# Patient Record
Sex: Male | Born: 1945 | Race: White | Hispanic: No | Marital: Married | Smoking: Former smoker
Health system: Southern US, Community
[De-identification: ages and names within clinical notes are randomized; demographics above are authoritative.]

## PROBLEM LIST (undated history)

## (undated) DIAGNOSIS — I2729 Other secondary pulmonary hypertension: Secondary | ICD-10-CM

## (undated) DIAGNOSIS — Z9289 Personal history of other medical treatment: Secondary | ICD-10-CM

## (undated) DIAGNOSIS — N183 Chronic kidney disease, stage 3 unspecified: Secondary | ICD-10-CM

## (undated) DIAGNOSIS — I714 Abdominal aortic aneurysm, without rupture, unspecified: Secondary | ICD-10-CM

## (undated) DIAGNOSIS — Z9981 Dependence on supplemental oxygen: Secondary | ICD-10-CM

## (undated) DIAGNOSIS — E785 Hyperlipidemia, unspecified: Secondary | ICD-10-CM

## (undated) DIAGNOSIS — E119 Type 2 diabetes mellitus without complications: Secondary | ICD-10-CM

## (undated) DIAGNOSIS — I10 Essential (primary) hypertension: Secondary | ICD-10-CM

## (undated) DIAGNOSIS — K219 Gastro-esophageal reflux disease without esophagitis: Secondary | ICD-10-CM

## (undated) DIAGNOSIS — J449 Chronic obstructive pulmonary disease, unspecified: Secondary | ICD-10-CM

## (undated) DIAGNOSIS — M1990 Unspecified osteoarthritis, unspecified site: Secondary | ICD-10-CM

## (undated) DIAGNOSIS — I480 Paroxysmal atrial fibrillation: Secondary | ICD-10-CM

## (undated) DIAGNOSIS — I5032 Chronic diastolic (congestive) heart failure: Secondary | ICD-10-CM

## (undated) DIAGNOSIS — Z8719 Personal history of other diseases of the digestive system: Secondary | ICD-10-CM

## (undated) HISTORY — DX: Essential (primary) hypertension: I10

## (undated) HISTORY — PX: KNEE ARTHROSCOPY: SHX127

## (undated) HISTORY — DX: Chronic diastolic (congestive) heart failure: I50.32

## (undated) HISTORY — DX: Other secondary pulmonary hypertension: I27.29

## (undated) HISTORY — DX: Personal history of other medical treatment: Z92.89

## (undated) HISTORY — DX: Hyperlipidemia, unspecified: E78.5

## (undated) HISTORY — DX: Gastro-esophageal reflux disease without esophagitis: K21.9

## (undated) HISTORY — DX: Paroxysmal atrial fibrillation: I48.0

---

## 2001-10-20 ENCOUNTER — Encounter: Admission: RE | Admit: 2001-10-20 | Discharge: 2001-10-20 | Payer: Self-pay | Admitting: Family Medicine

## 2001-10-20 ENCOUNTER — Encounter: Payer: Self-pay | Admitting: Family Medicine

## 2004-12-18 ENCOUNTER — Emergency Department (HOSPITAL_COMMUNITY): Admission: EM | Admit: 2004-12-18 | Discharge: 2004-12-18 | Payer: Self-pay | Admitting: Family Medicine

## 2009-03-21 ENCOUNTER — Emergency Department (HOSPITAL_COMMUNITY): Admission: EM | Admit: 2009-03-21 | Discharge: 2009-03-21 | Payer: Self-pay | Admitting: Emergency Medicine

## 2012-10-03 ENCOUNTER — Other Ambulatory Visit: Payer: Self-pay | Admitting: Family Medicine

## 2012-10-03 DIAGNOSIS — N189 Chronic kidney disease, unspecified: Secondary | ICD-10-CM

## 2012-10-06 ENCOUNTER — Ambulatory Visit
Admission: RE | Admit: 2012-10-06 | Discharge: 2012-10-06 | Disposition: A | Discharge status: Home / Self Care | Payer: Medicare Other | Source: Ambulatory Visit | Attending: Family Medicine | Admitting: Family Medicine

## 2012-10-06 ENCOUNTER — Encounter: Payer: Self-pay | Admitting: Vascular Surgery

## 2012-10-06 ENCOUNTER — Other Ambulatory Visit: Payer: Self-pay

## 2012-10-06 DIAGNOSIS — N189 Chronic kidney disease, unspecified: Secondary | ICD-10-CM

## 2012-10-07 ENCOUNTER — Encounter: Payer: Self-pay | Admitting: Vascular Surgery

## 2012-10-07 ENCOUNTER — Ambulatory Visit (INDEPENDENT_AMBULATORY_CARE_PROVIDER_SITE_OTHER): Payer: Medicare Other | Admitting: Vascular Surgery

## 2012-10-07 VITALS — BP 140/94 | HR 81 | Ht 66.5 in | Wt 161.0 lb

## 2012-10-07 DIAGNOSIS — Z01810 Encounter for preprocedural cardiovascular examination: Secondary | ICD-10-CM

## 2012-10-07 DIAGNOSIS — I714 Abdominal aortic aneurysm, without rupture: Secondary | ICD-10-CM

## 2012-10-07 DIAGNOSIS — I70219 Atherosclerosis of native arteries of extremities with intermittent claudication, unspecified extremity: Secondary | ICD-10-CM

## 2012-10-07 NOTE — Progress Notes (Addendum)
Subjective:     Patient ID: Mark [name], male   DOB: 09/13/1946, 67 y.o.   MRN: [id number]  HPI this 67 year old male was referred by Dr. Tanya Nones for evaluation of abdominal aortic aneurysm. Patient was being evaluated for renal insufficiency and ultrasound revealed a 7.1 x 6.8 cm AAA. Patient had no previous knowledge of the aneurysm. He has a history of creatinine of 2.16 07/31/2012 with a BUN of 30. He has no history of diabetes. He does have a history of hypertension. He denies any abdominal or back symptoms. He also relates left buttock discomfort after walking 100 feet which is relieved by rest. He has no symptoms in the right leg. Has a long history of tobacco abuse.  Past Medical History  Diagnosis Date  . Hypertension   . GERD (gastroesophageal reflux disease)   . Hyperlipidemia   . Chronic kidney disease   . Atrial fibrillation     History  Substance Use Topics  . Smoking status: Current Every Day Smoker -- 1.0 packs/day for 50 years    Types: Cigarettes  . Smokeless tobacco: Never Used  . Alcohol Use: No    Family History  Problem Relation Age of Onset  . Diabetes Mother   . Heart disease Father   . Heart attack Father     Allergies  Allergen Reactions  . Niaspan (Niacin Er) Anaphylaxis    Current outpatient prescriptions:amLODipine (NORVASC) 10 MG tablet, Take 10 mg by mouth daily., Disp: , Rfl: ;  aspirin 81 MG tablet, Take 81 mg by mouth daily., Disp: , Rfl: ;  atorvastatin (LIPITOR) 40 MG tablet, Take 40 mg by mouth daily., Disp: , Rfl: ;  losartan (COZAAR) 100 MG tablet, Take 100 mg by mouth daily., Disp: , Rfl: ;  omeprazole (PRILOSEC) 40 MG capsule, Take 40 mg by mouth daily., Disp: , Rfl:   BP 140/94  Pulse 81  Ht 5' 6.5" (1.689 m)  Wt 161 lb (73.029 kg)  BMI 25.60 kg/m2  SpO2 100%  Body mass index is 25.60 kg/(m^2).        Review of Systems denies chest pain but does have mild dyspnea on exertion. No PND or orthopnea or hemoptysis. Does  have a history of varicose veins and weakness in the arms and legs. Denies any CVA or lateralizing weakness amaurosis fugax diplopia blurred vision or syncope. Has been treated for reflux esophagitis     Objective:   Physical Exam blood pressure 140/94 heart rate 81 respirations 16 Gen.-alert and oriented x3 in no apparent distress HEENT normal for age Lungs no rhonchi or wheezing Cardiovascular regular rhythm no murmurs carotid pulses 3+ palpable no bruits audible Abdomen soft nontender-large pulsatile mass in periumbilical region-nontender-approximately 7 cm Musculoskeletal free of  major deformities Skin clear -no rashes Neurologic normal Lower extremities 3+ femoral , popliteal, and dorsalis pedis pulse right leg 2+ femoral-popliteal, and posterior tibial pulse palpable left leg  Today I reviewed the clinical records supplied by Dr. Tanya Nones including the blood work and the ultrasound report which revealed a 7.1 x 6.8 cm infrarenal abdominal aortic aneurysm. 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

## 2012-10-07 NOTE — Addendum Note (Signed)
Addended by: Sharee Pimple on: 10/07/2012 09:42 AM   Modules accepted: Orders

## 2012-10-09 ENCOUNTER — Ambulatory Visit (HOSPITAL_COMMUNITY): Payer: Medicare Other | Attending: Vascular Surgery | Admitting: Radiology

## 2012-10-09 VITALS — BP 138/85 | Ht 66.5 in | Wt 158.0 lb

## 2012-10-09 DIAGNOSIS — I70219 Atherosclerosis of native arteries of extremities with intermittent claudication, unspecified extremity: Secondary | ICD-10-CM

## 2012-10-09 DIAGNOSIS — F17200 Nicotine dependence, unspecified, uncomplicated: Secondary | ICD-10-CM | POA: Insufficient documentation

## 2012-10-09 DIAGNOSIS — I719 Aortic aneurysm of unspecified site, without rupture: Secondary | ICD-10-CM

## 2012-10-09 DIAGNOSIS — I739 Peripheral vascular disease, unspecified: Secondary | ICD-10-CM | POA: Insufficient documentation

## 2012-10-09 DIAGNOSIS — I714 Abdominal aortic aneurysm, without rupture: Secondary | ICD-10-CM

## 2012-10-09 DIAGNOSIS — I10 Essential (primary) hypertension: Secondary | ICD-10-CM | POA: Insufficient documentation

## 2012-10-09 DIAGNOSIS — R0989 Other specified symptoms and signs involving the circulatory and respiratory systems: Secondary | ICD-10-CM | POA: Insufficient documentation

## 2012-10-09 DIAGNOSIS — Z01810 Encounter for preprocedural cardiovascular examination: Secondary | ICD-10-CM

## 2012-10-09 DIAGNOSIS — R0602 Shortness of breath: Secondary | ICD-10-CM

## 2012-10-09 DIAGNOSIS — R000 Tachycardia, unspecified: Secondary | ICD-10-CM | POA: Insufficient documentation

## 2012-10-09 DIAGNOSIS — R0609 Other forms of dyspnea: Secondary | ICD-10-CM | POA: Insufficient documentation

## 2012-10-09 MED ORDER — TECHNETIUM TC 99M SESTAMIBI GENERIC - CARDIOLITE
9.1000 | Freq: Once | INTRAVENOUS | Status: AC | PRN
  Administered 2012-10-09: 9 via INTRAVENOUS

## 2012-10-09 MED ORDER — REGADENOSON 0.4 MG/5ML IV SOLN
0.4000 mg | Freq: Once | INTRAVENOUS | Status: AC
  Administered 2012-10-09: 0.4 mg via INTRAVENOUS

## 2012-10-09 MED ORDER — TECHNETIUM TC 99M SESTAMIBI GENERIC - CARDIOLITE
33.0000 | Freq: Once | INTRAVENOUS | Status: AC | PRN
  Administered 2012-10-09: 33 via INTRAVENOUS

## 2012-10-09 NOTE — Progress Notes (Signed)
[facility] SITE 3 NUCLEAR MED [address] [place], [place] [address] [phone]    Cardiology Nuclear Med Study  Mark [name] is a 67 y.o. male     MRN : [id number]     DOB: 1945-11-04  Procedure Date: 10/09/2012  Nuclear Med Background Indication for Stress Test:  Evaluation for Ischemia and Pending Surgical Clearance: 7.1cm AAA- Dr. Hart Rochester History:  No prior history of CAD Cardiac Risk Factors: Claudication, Family History - CAD, Hypertension, Lipids, PVD and Smoker  Symptoms:  DOE and Rapid HR   Nuclear Pre-Procedure Caffeine/Decaff Intake:  None NPO After: 8:30am   Lungs:  clear O2 Sat: 96% on room air. IV 0.9% NS with Angio Cath:  20g  IV Site: R Antecubital  IV Started by:  Stanton Kidney, EMT-P  Chest Size (in):  42 Cup Size: n/a  Height: 5' 6.5" (1.689 m)  Weight:  158 lb (71.668 kg)  BMI:  Body mass index is 25.12 kg/(m^2). Tech Comments:  NA    Nuclear Med Study 1 or 2 day study: 1 day  Stress Test Type:  Lexiscan  Reading MD: [name], MD  Order Authorizing Provider:  [name], MD  Resting Radionuclide: Technetium 53m Sestamibi  Resting Radionuclide Dose: 9.1 mCi   Stress Radionuclide:  Technetium 83m Sestamibi  Stress Radionuclide Dose: 30.2 mCi           Stress Protocol Rest HR: 61 Stress HR: 104  Rest BP: 138/85 Stress BP: 164/90  Exercise Time (min): n/a METS: n/a   Predicted Max HR: 152 bpm % Max HR: 68.42 bpm Rate Pressure Product: 21308    Dose of Adenosine (mg):  n/a Dose of Lexiscan: 0.4 mg  Dose of Atropine (mg): n/a Dose of Dobutamine: n/a mcg/kg/min (at max HR)  Stress Test Technologist: Bonnita Levan, RN  Nuclear Technologist:  Domenic Polite, CNMT     Rest Procedure:  Myocardial perfusion imaging was performed at rest 45 minutes following the intravenous administration of Technetium 67m Sestamibi. Rest ECG: NSR - Normal EKG  Stress Procedure:  The patient received IV Lexiscan 0.4 mg over 15-seconds.  Technetium  44m Sestamibi injected at 30-seconds.  Quantitative spect images were obtained after a 45 minute delay. Stress ECG: No significant ST segment change suggestive of ischemia.  QPS Raw Data Images:  There is interference from nuclear activity from structures below the diaphragm. This does not affect the ability to read the study. Stress Images:  Normal homogeneous uptake in all areas of the myocardium. Rest Images:  Normal homogeneous uptake in all areas of the myocardium. Subtraction (SDS):  No evidence of ischemia. Transient Ischemic Dilatation (Normal <1.22):  0.98 Lung/Heart Ratio (Normal <0.45):  0.26  Quantitative Gated Spect Images QGS EDV:  76 ml QGS ESV:  31 ml  Impression Exercise Capacity:  Lexiscan with no exercise. BP Response:  Normal blood pressure response. Clinical Symptoms:  No chest pain or dyspnea. ECG Impression:  No significant ST segment change suggestive of ischemia. Comparison with Prior Nuclear Study: No images to compare  Overall Impression:  Normal stress nuclear study.  LV Ejection Fraction: 59%.  LV Wall Motion:  NL LV Function; NL Wall Motion  [name]

## 2012-10-10 ENCOUNTER — Ambulatory Visit
Admission: RE | Admit: 2012-10-10 | Discharge: 2012-10-10 | Disposition: A | Discharge status: Home / Self Care | Payer: Medicare Other | Source: Ambulatory Visit | Attending: Vascular Surgery | Admitting: Vascular Surgery

## 2012-10-10 DIAGNOSIS — I714 Abdominal aortic aneurysm, without rupture: Secondary | ICD-10-CM

## 2012-10-10 DIAGNOSIS — I70219 Atherosclerosis of native arteries of extremities with intermittent claudication, unspecified extremity: Secondary | ICD-10-CM

## 2012-10-10 DIAGNOSIS — Z01810 Encounter for preprocedural cardiovascular examination: Secondary | ICD-10-CM

## 2012-10-13 ENCOUNTER — Encounter (INDEPENDENT_AMBULATORY_CARE_PROVIDER_SITE_OTHER): Payer: Medicare Other | Admitting: *Deleted

## 2012-10-13 ENCOUNTER — Encounter (HOSPITAL_COMMUNITY): Payer: Self-pay | Admitting: Pharmacy Technician

## 2012-10-13 ENCOUNTER — Encounter: Payer: Self-pay | Admitting: Vascular Surgery

## 2012-10-13 ENCOUNTER — Other Ambulatory Visit: Payer: Self-pay

## 2012-10-13 ENCOUNTER — Ambulatory Visit (INDEPENDENT_AMBULATORY_CARE_PROVIDER_SITE_OTHER): Payer: Medicare Other | Admitting: Vascular Surgery

## 2012-10-13 VITALS — BP 171/108 | HR 80 | Resp 20 | Ht 66.5 in | Wt 158.0 lb

## 2012-10-13 DIAGNOSIS — I714 Abdominal aortic aneurysm, without rupture, unspecified: Secondary | ICD-10-CM

## 2012-10-13 DIAGNOSIS — I70219 Atherosclerosis of native arteries of extremities with intermittent claudication, unspecified extremity: Secondary | ICD-10-CM

## 2012-10-13 DIAGNOSIS — Z01810 Encounter for preprocedural cardiovascular examination: Secondary | ICD-10-CM

## 2012-10-13 NOTE — Progress Notes (Signed)
Subjective:     Patient ID: Mark [name], male   DOB: 06/06/1946, 66 y.o.   MRN: [id number]  HPI this 66 -year-old male returns to discuss further treatment of his large abdominal aortic aneurysm. The Cardiolite performed which was excellent with a good ejection fraction and no evidence of ischemia. CT scan was performed yesterday which are reviewed by computer. He has a 7.5 cm infrarenal abdominal aortic aneurysm which extends up to the renal arteries-he is not a candidate for aortic stent grafting. He continues to have left buttock claudication symptoms in her lower extremity arterial Dopplers performed today.  Past Medical History  Diagnosis Date  . Hypertension   . GERD (gastroesophageal reflux disease)   . Hyperlipidemia   . Chronic kidney disease   . Atrial fibrillation     History  Substance Use Topics  . Smoking status: Current Every Day Smoker -- 1.0 packs/day for 50 years    Types: Cigarettes  . Smokeless tobacco: Never Used  . Alcohol Use: No    Family History  Problem Relation Age of Onset  . Diabetes Mother   . Heart disease Father   . Heart attack Father     Allergies  Allergen Reactions  . Niaspan (Niacin Er) Anaphylaxis    Current outpatient prescriptions:amLODipine (NORVASC) 10 MG tablet, Take 10 mg by mouth daily., Disp: , Rfl: ;  aspirin 81 MG tablet, Take 81 mg by mouth daily., Disp: , Rfl: ;  atorvastatin (LIPITOR) 40 MG tablet, Take 40 mg by mouth daily., Disp: , Rfl: ;  losartan (COZAAR) 100 MG tablet, Take 100 mg by mouth daily., Disp: , Rfl: ;  omeprazole (PRILOSEC) 40 MG capsule, Take 40 mg by mouth daily., Disp: , Rfl:   BP 171/108  Pulse 80  Resp 20  Ht 5' 6.5" (1.689 m)  Wt 158 lb (71.668 kg)  BMI 25.12 kg/m2  Body mass index is 25.12 kg/(m^2).           Review of Systems     Objective:   Physical Exam blood pressure 171 of 108 heart rate 80 respirations 20 General well-developed well-nourished male no apparent stress alert  and oriented x3 Lungs no rhonchi or wheezing Cardiovascular regular rhythm no murmurs carotid pulses 3+ no bruits Abdomen soft with large pulsatile mass approximating 7 cm-nontender 3+ femoral and posterior tibial pulse on the right 2+ femoral posterior tibial pulse on the left  Today I reviewed the CT scan by computer and also reviewed lower extremity arterial Doppler studies which interpreted performed today in the lab. ABI on the left is 0.91 on the right is 1.09. 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 Patient would like to proceed on Wednesday 

## 2012-10-14 ENCOUNTER — Ambulatory Visit: Payer: Medicare Other | Admitting: Vascular Surgery

## 2012-10-14 ENCOUNTER — Ambulatory Visit (HOSPITAL_COMMUNITY)
Admission: RE | Admit: 2012-10-14 | Discharge: 2012-10-14 | Disposition: A | Discharge status: Home / Self Care | Payer: Medicare Other | Source: Ambulatory Visit | Attending: Anesthesiology | Admitting: Anesthesiology

## 2012-10-14 ENCOUNTER — Encounter (HOSPITAL_COMMUNITY)
Admission: RE | Admit: 2012-10-14 | Discharge: 2012-10-14 | Disposition: A | Discharge status: Home / Self Care | Payer: Medicare Other | Source: Ambulatory Visit | Attending: Vascular Surgery | Admitting: Vascular Surgery

## 2012-10-14 ENCOUNTER — Encounter (HOSPITAL_COMMUNITY): Payer: Self-pay

## 2012-10-14 HISTORY — DX: Unspecified osteoarthritis, unspecified site: M19.90

## 2012-10-14 HISTORY — DX: Personal history of other diseases of the digestive system: Z87.19

## 2012-10-14 LAB — URINALYSIS, ROUTINE W REFLEX MICROSCOPIC
Bilirubin Urine: NEGATIVE
Hgb urine dipstick: NEGATIVE
Ketones, ur: NEGATIVE mg/dL
Nitrite: NEGATIVE
Protein, ur: 100 mg/dL — AB
Urobilinogen, UA: 0.2 mg/dL (ref 0.0–1.0)

## 2012-10-14 LAB — BLOOD GAS, ARTERIAL
Drawn by: 206361
FIO2: 0.21 %
Patient temperature: 98.6
pCO2 arterial: 44.2 mmHg (ref 35.0–45.0)
pH, Arterial: 7.334 — ABNORMAL LOW (ref 7.350–7.450)

## 2012-10-14 LAB — PROTIME-INR
INR: 1 (ref 0.00–1.49)
Prothrombin Time: 13.1 seconds (ref 11.6–15.2)

## 2012-10-14 LAB — CBC
MCHC: 34.1 g/dL (ref 30.0–36.0)
Platelets: 125 10*3/uL — ABNORMAL LOW (ref 150–400)
RDW: 13.1 % (ref 11.5–15.5)
WBC: 6.6 10*3/uL (ref 4.0–10.5)

## 2012-10-14 LAB — APTT: aPTT: 35 seconds (ref 24–37)

## 2012-10-14 LAB — ABO/RH: ABO/RH(D): O POS

## 2012-10-14 LAB — COMPREHENSIVE METABOLIC PANEL
AST: 17 U/L (ref 0–37)
Albumin: 3.7 g/dL (ref 3.5–5.2)
Alkaline Phosphatase: 84 U/L (ref 39–117)
BUN: 28 mg/dL — ABNORMAL HIGH (ref 6–23)
Chloride: 105 mEq/L (ref 96–112)
Potassium: 4.4 mEq/L (ref 3.5–5.1)
Total Bilirubin: 0.2 mg/dL — ABNORMAL LOW (ref 0.3–1.2)
Total Protein: 7.1 g/dL (ref 6.0–8.3)

## 2012-10-14 LAB — URINE MICROSCOPIC-ADD ON

## 2012-10-14 MED ORDER — DEXTROSE 5 % IV SOLN
1.5000 g | INTRAVENOUS | Status: AC
  Administered 2012-10-15: 1.5 g via INTRAVENOUS
  Filled 2012-10-14: qty 1.5

## 2012-10-14 NOTE — Progress Notes (Signed)
Forwarded Abnormal lab and stress test to anesthesia for review.

## 2012-10-14 NOTE — Pre-Procedure Instructions (Signed)
CAYETANO MIKITA  10/14/2012   Your procedure is scheduled on:  Wednesday October 15, 2012  Report to [facility] at 6:30 AM.  Call this number if you have problems the morning of surgery: [phone]   Remember:   Do not eat food or drink liquids after midnight.   Take these medicines the morning of surgery with A SIP OF WATER: amlodipine, omeprazole   Do not wear jewelry, make-up or nail polish.  Do not wear lotions, powders, or perfumes.  Do not shave 48 hours prior to surgery. Men may shave face and neck.  Do not bring valuables to the hospital.  Contacts, dentures or bridgework may not be worn into surgery.  Leave suitcase in the car. After surgery it may be brought to your room.  For patients admitted to the hospital, checkout time is 11:00 AM the day of  discharge.   Patients discharged the day of surgery will not be allowed to drive  home.  Name and phone number of your driver: family / friend  Special Instructions: Shower using CHG 2 nights before surgery and the night before surgery.  If you shower the day of surgery use CHG.  Use special wash - you have one bottle of CHG for all showers.  You should use approximately 1/3 of the bottle for each shower.   Please read over the following fact sheets that you were given: Pain Booklet, Coughing and Deep Breathing, Blood Transfusion Information, MRSA Information and Surgical Site Infection Prevention

## 2012-10-14 NOTE — Consult Note (Signed)
Anesthesia chart review:  Patient is a 67 year old male scheduled for open repair of AAA tomorrow by Dr. Hart Rochester.  History includes AAA, smoking, HTN, afib, CKD, hiatal hernia, GERD, arthritis.  PCP is Dr. Lynnea Ferrier.  Preoperative labs noted.  Patient with known CKD with Cr 2.23 on 10/03/12.  CXR on 10/14/12 showed mild hyperinflation, no acute findings.  EKG on 10/14/12 showed NSR (p waves are flat in several leads) with sinus arrhythmia.  Nuclear stress test on 10/09/12 showed: Normal stress nuclear study. LV Ejection Fraction: 59%. LV Wall Motion: NL LV Function; NL Wall Motion.  His renal function appears stable.  Anticipate he can proceed as planned.  Shonna Chock, PA-C 10/14/12 [phone]

## 2012-10-15 ENCOUNTER — Inpatient Hospital Stay (HOSPITAL_COMMUNITY): Payer: Medicare Other

## 2012-10-15 ENCOUNTER — Encounter (HOSPITAL_COMMUNITY): Payer: Self-pay | Admitting: Vascular Surgery

## 2012-10-15 ENCOUNTER — Ambulatory Visit (HOSPITAL_COMMUNITY): Payer: Medicare Other | Admitting: Vascular Surgery

## 2012-10-15 ENCOUNTER — Inpatient Hospital Stay (HOSPITAL_COMMUNITY)
Admission: RE | Admit: 2012-10-15 | Discharge: 2012-10-19 | DRG: 238 | Disposition: A | Discharge status: Home / Self Care | Payer: Medicare Other | Source: Ambulatory Visit | Attending: Vascular Surgery | Admitting: Vascular Surgery

## 2012-10-15 ENCOUNTER — Encounter (HOSPITAL_COMMUNITY)
Admission: RE | Disposition: A | Discharge status: Home / Self Care | Payer: Self-pay | Source: Ambulatory Visit | Attending: Vascular Surgery

## 2012-10-15 DIAGNOSIS — I714 Abdominal aortic aneurysm, without rupture, unspecified: Principal | ICD-10-CM | POA: Diagnosis present

## 2012-10-15 DIAGNOSIS — E785 Hyperlipidemia, unspecified: Secondary | ICD-10-CM | POA: Diagnosis present

## 2012-10-15 DIAGNOSIS — I4891 Unspecified atrial fibrillation: Secondary | ICD-10-CM | POA: Diagnosis present

## 2012-10-15 DIAGNOSIS — N189 Chronic kidney disease, unspecified: Secondary | ICD-10-CM | POA: Diagnosis present

## 2012-10-15 DIAGNOSIS — I129 Hypertensive chronic kidney disease with stage 1 through stage 4 chronic kidney disease, or unspecified chronic kidney disease: Secondary | ICD-10-CM | POA: Diagnosis present

## 2012-10-15 DIAGNOSIS — K219 Gastro-esophageal reflux disease without esophagitis: Secondary | ICD-10-CM | POA: Diagnosis present

## 2012-10-15 DIAGNOSIS — I708 Atherosclerosis of other arteries: Secondary | ICD-10-CM | POA: Diagnosis present

## 2012-10-15 DIAGNOSIS — Z87891 Personal history of nicotine dependence: Secondary | ICD-10-CM

## 2012-10-15 DIAGNOSIS — D696 Thrombocytopenia, unspecified: Secondary | ICD-10-CM | POA: Diagnosis present

## 2012-10-15 HISTORY — PX: ABDOMINAL AORTIC ANEURYSM REPAIR: SUR1152

## 2012-10-15 HISTORY — PX: ABDOMINAL AORTIC ANEURYSM REPAIR: SHX42

## 2012-10-15 LAB — BLOOD GAS, ARTERIAL
Acid-Base Excess: 0.1 mmol/L (ref 0.0–2.0)
Acid-base deficit: 0.9 mmol/L (ref 0.0–2.0)
O2 Content: 2 L/min
Patient temperature: 97.4
TCO2: 26.9 mmol/L (ref 0–100)
pCO2 arterial: 54.8 mmHg — ABNORMAL HIGH (ref 35.0–45.0)
pCO2 arterial: 50.8 mmHg — ABNORMAL HIGH (ref 35.0–45.0)
pH, Arterial: 7.319 — ABNORMAL LOW (ref 7.350–7.450)
pO2, Arterial: 65.4 mmHg — ABNORMAL LOW (ref 80.0–100.0)

## 2012-10-15 LAB — POCT I-STAT 7, (LYTES, BLD GAS, ICA,H+H)
Acid-Base Excess: 4 mmol/L — ABNORMAL HIGH (ref 0.0–2.0)
Bicarbonate: 28.5 mEq/L — ABNORMAL HIGH (ref 20.0–24.0)
Bicarbonate: 25.6 mEq/L — ABNORMAL HIGH (ref 20.0–24.0)
HCT: 32 % — ABNORMAL LOW (ref 39.0–52.0)
Hemoglobin: 10.9 g/dL — ABNORMAL LOW (ref 13.0–17.0)
Hemoglobin: 8.5 g/dL — ABNORMAL LOW (ref 13.0–17.0)
O2 Saturation: 100 %
Patient temperature: 36
Potassium: 4.5 mEq/L (ref 3.5–5.1)
Potassium: 4.6 mEq/L (ref 3.5–5.1)
Sodium: 136 mEq/L (ref 135–145)
TCO2: 30 mmol/L (ref 0–100)
TCO2: 27 mmol/L (ref 0–100)
pCO2 arterial: 39.6 mmHg (ref 35.0–45.0)
pH, Arterial: 7.444 (ref 7.350–7.450)
pH, Arterial: 7.413 (ref 7.350–7.450)

## 2012-10-15 LAB — PROTIME-INR
INR: 1.21 (ref 0.00–1.49)
Prothrombin Time: 15.1 seconds (ref 11.6–15.2)

## 2012-10-15 LAB — BASIC METABOLIC PANEL
CO2: 24 mEq/L (ref 19–32)
Chloride: 103 mEq/L (ref 96–112)
Glucose, Bld: 178 mg/dL — ABNORMAL HIGH (ref 70–99)
Potassium: 4.9 mEq/L (ref 3.5–5.1)
Sodium: 137 mEq/L (ref 135–145)

## 2012-10-15 LAB — CBC
Hemoglobin: 11.1 g/dL — ABNORMAL LOW (ref 13.0–17.0)
Platelets: 76 10*3/uL — ABNORMAL LOW (ref 150–400)
RBC: 3.48 MIL/uL — ABNORMAL LOW (ref 4.22–5.81)
WBC: 12.5 10*3/uL — ABNORMAL HIGH (ref 4.0–10.5)

## 2012-10-15 LAB — MAGNESIUM: Magnesium: 1.8 mg/dL (ref 1.5–2.5)

## 2012-10-15 LAB — APTT: aPTT: 32 seconds (ref 24–37)

## 2012-10-15 SURGERY — ANEURYSM ABDOMINAL AORTIC REPAIR
Anesthesia: General | Site: Abdomen | Wound class: Clean

## 2012-10-15 MED ORDER — ONDANSETRON HCL 4 MG/2ML IJ SOLN: 4.0000 mg | Freq: Four times a day (QID) | INTRAMUSCULAR | Status: DC | PRN

## 2012-10-15 MED ORDER — MEPERIDINE HCL 25 MG/ML IJ SOLN: 6.2500 mg | INTRAMUSCULAR | Status: DC | PRN

## 2012-10-15 MED ORDER — HYDROMORPHONE HCL PF 1 MG/ML IJ SOLN: 0.2500 mg | INTRAMUSCULAR | Status: DC | PRN

## 2012-10-15 MED ORDER — LACTATED RINGERS IV SOLN
INTRAVENOUS | Status: DC | PRN
  Administered 2012-10-15 (×2): via INTRAVENOUS

## 2012-10-15 MED ORDER — LIDOCAINE HCL (CARDIAC) 20 MG/ML IV SOLN
INTRAVENOUS | Status: DC | PRN
  Administered 2012-10-15 (×2): 100 mg via INTRAVENOUS

## 2012-10-15 MED ORDER — SUFENTANIL CITRATE 50 MCG/ML IV SOLN
INTRAVENOUS | Status: DC | PRN
Start: 2012-10-15 — End: 2012-10-15
  Administered 2012-10-15: 5 ug via INTRAVENOUS
  Administered 2012-10-15: 10 ug via INTRAVENOUS
  Administered 2012-10-15: 40 ug via INTRAVENOUS
  Administered 2012-10-15: 10 ug via INTRAVENOUS

## 2012-10-15 MED ORDER — KCL IN DEXTROSE-NACL 20-5-0.45 MEQ/L-%-% IV SOLN
INTRAVENOUS | Status: DC
  Administered 2012-10-15: 75 mL/h via INTRAVENOUS
  Filled 2012-10-15 (×3): qty 1000

## 2012-10-15 MED ORDER — ONDANSETRON HCL 4 MG/2ML IJ SOLN
INTRAMUSCULAR | Status: DC | PRN
  Administered 2012-10-15: 4 mg via INTRAVENOUS

## 2012-10-15 MED ORDER — ACETAMINOPHEN 325 MG PO TABS: 325.0000 mg | ORAL_TABLET | ORAL | Status: DC | PRN

## 2012-10-15 MED ORDER — PROPOFOL 10 MG/ML IV BOLUS
INTRAVENOUS | Status: DC | PRN
  Administered 2012-10-15: 120 mg via INTRAVENOUS

## 2012-10-15 MED ORDER — LABETALOL HCL 5 MG/ML IV SOLN
INTRAVENOUS | Status: DC | PRN
  Administered 2012-10-15 (×2): 10 mg via INTRAVENOUS

## 2012-10-15 MED ORDER — FUROSEMIDE 10 MG/ML IJ SOLN
INTRAMUSCULAR | Status: DC | PRN
  Administered 2012-10-15: 20 mg via INTRAMUSCULAR

## 2012-10-15 MED ORDER — PROTAMINE SULFATE 10 MG/ML IV SOLN
INTRAVENOUS | Status: DC | PRN
  Administered 2012-10-15: 100 mg via INTRAVENOUS

## 2012-10-15 MED ORDER — MORPHINE SULFATE 2 MG/ML IJ SOLN
2.0000 mg | INTRAMUSCULAR | Status: DC | PRN
  Administered 2012-10-15 (×5): 2 mg via INTRAVENOUS
  Administered 2012-10-16 (×2): 4 mg via INTRAVENOUS
  Administered 2012-10-16 (×2): 2 mg via INTRAVENOUS
  Administered 2012-10-16: 4 mg via INTRAVENOUS
  Administered 2012-10-17 – 2012-10-18 (×2): 2 mg via INTRAVENOUS
  Filled 2012-10-15: qty 2
  Filled 2012-10-15 (×3): qty 1
  Filled 2012-10-15 (×3): qty 2
  Filled 2012-10-15 (×3): qty 1

## 2012-10-15 MED ORDER — PHENYLEPHRINE HCL 10 MG/ML IJ SOLN
10.0000 mg | INTRAVENOUS | Status: DC | PRN
  Administered 2012-10-15: 20 ug/min via INTRAVENOUS

## 2012-10-15 MED ORDER — MORPHINE SULFATE 2 MG/ML IJ SOLN
INTRAMUSCULAR | Status: AC
  Filled 2012-10-15: qty 1

## 2012-10-15 MED ORDER — GLYCOPYRROLATE 0.2 MG/ML IJ SOLN
INTRAMUSCULAR | Status: DC | PRN
  Administered 2012-10-15: .6 mg via INTRAVENOUS

## 2012-10-15 MED ORDER — DOPAMINE-DEXTROSE 3.2-5 MG/ML-% IV SOLN: 3.0000 ug/kg/min | INTRAVENOUS | Status: DC

## 2012-10-15 MED ORDER — GUAIFENESIN-DM 100-10 MG/5ML PO SYRP: 15.0000 mL | ORAL_SOLUTION | ORAL | Status: DC | PRN

## 2012-10-15 MED ORDER — 0.9 % SODIUM CHLORIDE (POUR BTL) OPTIME
TOPICAL | Status: DC | PRN
  Administered 2012-10-15 (×2): 1000 mL

## 2012-10-15 MED ORDER — POTASSIUM CHLORIDE CRYS ER 20 MEQ PO TBCR: 20.0000 meq | EXTENDED_RELEASE_TABLET | Freq: Once | ORAL | Status: AC | PRN

## 2012-10-15 MED ORDER — HEPARIN SODIUM (PORCINE) 1000 UNIT/ML IJ SOLN
INTRAMUSCULAR | Status: DC | PRN
  Administered 2012-10-15: 6000 [IU] via INTRAVENOUS

## 2012-10-15 MED ORDER — MANNITOL 25 % IV SOLN
INTRAVENOUS | Status: DC | PRN
  Administered 2012-10-15: 25 g via INTRAVENOUS

## 2012-10-15 MED ORDER — PANTOPRAZOLE SODIUM 40 MG PO TBEC: 40.0000 mg | DELAYED_RELEASE_TABLET | Freq: Every day | ORAL | Status: DC

## 2012-10-15 MED ORDER — PANTOPRAZOLE SODIUM 40 MG IV SOLR
40.0000 mg | INTRAVENOUS | Status: DC
  Administered 2012-10-15 – 2012-10-18 (×4): 40 mg via INTRAVENOUS
  Filled 2012-10-15 (×6): qty 40

## 2012-10-15 MED ORDER — NEOSTIGMINE METHYLSULFATE 1 MG/ML IJ SOLN
INTRAMUSCULAR | Status: DC | PRN
  Administered 2012-10-15: 5 mg via INTRAVENOUS

## 2012-10-15 MED ORDER — ALBUMIN HUMAN 5 % IV SOLN
INTRAVENOUS | Status: DC | PRN
  Administered 2012-10-15: 11:00:00 via INTRAVENOUS

## 2012-10-15 MED ORDER — LACTATED RINGERS IV SOLN
INTRAVENOUS | Status: DC | PRN
  Administered 2012-10-15: 07:00:00 via INTRAVENOUS

## 2012-10-15 MED ORDER — SODIUM CHLORIDE 0.9 % IV SOLN: 500.0000 mL | Freq: Once | INTRAVENOUS | Status: AC | PRN

## 2012-10-15 MED ORDER — DEXTROSE 5 % IV SOLN
1.5000 g | Freq: Two times a day (BID) | INTRAVENOUS | Status: AC
  Administered 2012-10-15 – 2012-10-16 (×2): 1.5 g via INTRAVENOUS
  Filled 2012-10-15 (×2): qty 1.5

## 2012-10-15 MED ORDER — ACETAMINOPHEN 650 MG RE SUPP: 325.0000 mg | RECTAL | Status: DC | PRN

## 2012-10-15 MED ORDER — LABETALOL HCL 5 MG/ML IV SOLN
10.0000 mg | INTRAVENOUS | Status: DC | PRN
  Administered 2012-10-16: 10 mg via INTRAVENOUS
  Filled 2012-10-15 (×2): qty 4

## 2012-10-15 MED ORDER — SODIUM CHLORIDE 0.9 % IR SOLN
Status: DC | PRN
  Administered 2012-10-15: 08:00:00

## 2012-10-15 MED ORDER — MIDAZOLAM HCL 5 MG/5ML IJ SOLN
INTRAMUSCULAR | Status: DC | PRN
  Administered 2012-10-15: 2 mg via INTRAVENOUS

## 2012-10-15 MED ORDER — SODIUM CHLORIDE 0.9 % IV SOLN: INTRAVENOUS | Status: DC

## 2012-10-15 MED ORDER — FUROSEMIDE 10 MG/ML IJ SOLN: 20.0000 mg | INTRAMUSCULAR | Status: DC

## 2012-10-15 MED ORDER — OXYCODONE HCL 5 MG/5ML PO SOLN: 5.0000 mg | Freq: Once | ORAL | Status: DC | PRN

## 2012-10-15 MED ORDER — MAGNESIUM SULFATE 40 MG/ML IJ SOLN: 2.0000 g | Freq: Once | INTRAMUSCULAR | Status: AC | PRN

## 2012-10-15 MED ORDER — ROCURONIUM BROMIDE 100 MG/10ML IV SOLN
INTRAVENOUS | Status: DC | PRN
  Administered 2012-10-15: 50 mg via INTRAVENOUS
  Administered 2012-10-15: 20 mg via INTRAVENOUS

## 2012-10-15 MED ORDER — ONDANSETRON HCL 4 MG/2ML IJ SOLN: 4.0000 mg | Freq: Once | INTRAMUSCULAR | Status: DC | PRN

## 2012-10-15 MED ORDER — DOCUSATE SODIUM 100 MG PO CAPS
100.0000 mg | ORAL_CAPSULE | Freq: Every day | ORAL | Status: DC
  Administered 2012-10-16 – 2012-10-19 (×3): 100 mg via ORAL
  Filled 2012-10-15 (×4): qty 1

## 2012-10-15 MED ORDER — HEMOSTATIC AGENTS (NO CHARGE) OPTIME
TOPICAL | Status: DC | PRN
  Administered 2012-10-15: 1 via TOPICAL

## 2012-10-15 MED ORDER — PHENOL 1.4 % MT LIQD
1.0000 | OROMUCOSAL | Status: DC | PRN
  Filled 2012-10-15: qty 177

## 2012-10-15 MED ORDER — SODIUM CHLORIDE 0.9 % IV SOLN
INTRAVENOUS | Status: DC | PRN
  Administered 2012-10-15: 11:00:00 via INTRAVENOUS

## 2012-10-15 MED ORDER — HYDRALAZINE HCL 20 MG/ML IJ SOLN
10.0000 mg | INTRAMUSCULAR | Status: DC | PRN
  Filled 2012-10-15: qty 0.5

## 2012-10-15 MED ORDER — METOPROLOL TARTRATE 1 MG/ML IV SOLN: 2.0000 mg | INTRAVENOUS | Status: DC | PRN

## 2012-10-15 MED ORDER — OXYCODONE HCL 5 MG PO TABS: 5.0000 mg | ORAL_TABLET | Freq: Once | ORAL | Status: DC | PRN

## 2012-10-15 MED ORDER — ALUM & MAG HYDROXIDE-SIMETH 200-200-20 MG/5ML PO SUSP: 15.0000 mL | ORAL | Status: DC | PRN

## 2012-10-15 SURGICAL SUPPLY — 59 items
CANISTER SUCTION 2500CC (MISCELLANEOUS) ×2 IMPLANT
CLIP TI MEDIUM 24 (CLIP) ×2 IMPLANT
CLIP TI WIDE RED SMALL 24 (CLIP) ×2 IMPLANT
CLOTH BEACON ORANGE TIMEOUT ST (SAFETY) ×2 IMPLANT
COVER SURGICAL LIGHT HANDLE (MISCELLANEOUS) ×2 IMPLANT
DRAPE WARM FLUID 44X44 (DRAPE) ×2 IMPLANT
DRSG COVADERM 4X14 (GAUZE/BANDAGES/DRESSINGS) ×2 IMPLANT
ELECT BLADE 6.5 EXT (BLADE) IMPLANT
ELECT REM PT RETURN 9FT ADLT (ELECTROSURGICAL) ×2
ELECTRODE REM PT RTRN 9FT ADLT (ELECTROSURGICAL) ×1 IMPLANT
FELT TEFLON 4 X1 (Mesh General) ×2 IMPLANT
GLOVE BIO SURGEON STRL SZ 6.5 (GLOVE) ×8 IMPLANT
GLOVE BIOGEL PI IND STRL 6.5 (GLOVE) ×1 IMPLANT
GLOVE BIOGEL PI IND STRL 7.0 (GLOVE) ×2 IMPLANT
GLOVE BIOGEL PI INDICATOR 6.5 (GLOVE) ×1
GLOVE BIOGEL PI INDICATOR 7.0 (GLOVE) ×2
GLOVE ECLIPSE 6.5 STRL STRAW (GLOVE) ×2 IMPLANT
GLOVE SS BIOGEL STRL SZ 7 (GLOVE) ×1 IMPLANT
GLOVE SS BIOGEL STRL SZ 7.5 (GLOVE) ×1 IMPLANT
GLOVE SUPERSENSE BIOGEL SZ 7 (GLOVE) ×1
GLOVE SUPERSENSE BIOGEL SZ 7.5 (GLOVE) ×1
GOWN STRL NON-REIN LRG LVL3 (GOWN DISPOSABLE) ×10 IMPLANT
GRAFT HEMASHIELD 18X9MM (Vascular Products) ×2 IMPLANT
INSERT FOGARTY 61MM (MISCELLANEOUS) ×2 IMPLANT
INSERT FOGARTY SM (MISCELLANEOUS) ×4 IMPLANT
KIT BASIN OR (CUSTOM PROCEDURE TRAY) ×2 IMPLANT
KIT ROOM TURNOVER OR (KITS) ×2 IMPLANT
LOOP VESSEL MAXI BLUE (MISCELLANEOUS) IMPLANT
LOOP VESSEL MINI RED (MISCELLANEOUS) IMPLANT
NS IRRIG 1000ML POUR BTL (IV SOLUTION) ×4 IMPLANT
PACK AORTA (CUSTOM PROCEDURE TRAY) ×2 IMPLANT
PAD ARMBOARD 7.5X6 YLW CONV (MISCELLANEOUS) ×4 IMPLANT
SPECIMEN JAR MEDIUM (MISCELLANEOUS) ×2 IMPLANT
SPONGE LAP 18X18 X RAY DECT (DISPOSABLE) IMPLANT
SPONGE LAP 4X18 X RAY DECT (DISPOSABLE) IMPLANT
STAPLER VISISTAT 35W (STAPLE) ×2 IMPLANT
SUT ETHIBOND 5 LR DA (SUTURE) IMPLANT
SUT PROLENE 1 XLH 60 (SUTURE) ×4 IMPLANT
SUT PROLENE 3 0 SH1 36 (SUTURE) ×4 IMPLANT
SUT PROLENE 4 0 RB 1 (SUTURE)
SUT PROLENE 4-0 RB1 .5 CRCL 36 (SUTURE) IMPLANT
SUT PROLENE 5 0 CC 1 (SUTURE) ×4 IMPLANT
SUT PROLENE 5 0 CC1 (SUTURE) ×2 IMPLANT
SUT PROLENE 6 0 C 1 30 (SUTURE) ×2 IMPLANT
SUT SILK 2 0 (SUTURE) ×2
SUT SILK 2 0 SH CR/8 (SUTURE) ×2 IMPLANT
SUT SILK 2-0 18XBRD TIE 12 (SUTURE) ×1 IMPLANT
SUT SILK 3 0 (SUTURE) ×1
SUT SILK 3 0 SH CR/8 (SUTURE) IMPLANT
SUT SILK 3-0 18XBRD TIE 12 (SUTURE) ×1 IMPLANT
SUT VIC AB 2-0 CTX 36 (SUTURE) ×4 IMPLANT
SUT VIC AB 3-0 MH 27 (SUTURE) ×6 IMPLANT
SUT VIC AB 3-0 SH 27 (SUTURE) ×4
SUT VIC AB 3-0 SH 27X BRD (SUTURE) ×2 IMPLANT
TOWEL OR 17X24 6PK STRL BLUE (TOWEL DISPOSABLE) ×4 IMPLANT
TOWEL OR 17X26 10 PK STRL BLUE (TOWEL DISPOSABLE) ×4 IMPLANT
TRAY FOLEY CATH 14FRSI W/METER (CATHETERS) ×2 IMPLANT
VITASURE ABSORB HEMASTAT ×2 IMPLANT
WATER STERILE IRR 1000ML POUR (IV SOLUTION) ×4 IMPLANT

## 2012-10-15 NOTE — Op Note (Signed)
OPERATIVE REPORT  Date of Surgery: 10/15/2012  Surgeon: [name], MD  Assistant: Dr. Tawanna Cooler early, Lelon Mast Rhyne,PA  Pre-op Diagnosis: Abdominal Aortic Aneurysm Left Iliac Artery Stenosis  Post-op Diagnosis: Same Procedure: Procedure(s): Resection and grafting of abdominal aortic aneurysm with insertion of an aorto by common iliac graft using 18 x 9 mm Hemashield Dacron graft   Anesthesia: General  EBL: 400 cc  Complications: None  Patient was taken to the operating room placed in the supine position at which time satisfactory general endotracheal anesthesia was assured. A Swan-Ganz catheter and radial arterial line were placed by anesthesia preoperatively. The abdomen and groins were prepped with Betadine scrub and solution draped in routine sterile manner. Midline incision was made from xiphoid to just above the pubis carried down through subcutaneous tissue and linear although using the Bovie. Cranial cavity was entered and thoroughly explored. Stomach duodenum small bowel and colon are unremarkable. Liver was smooth with no palpable masses. Gallbladder was examined as much as possible but it was somewhat obscured by some adhesions. No stones were palpable. Transverse colon was elevated the intestines reflected to the right side exposing a 7 cm abdominal aortic aneurysm. Retroperitoneal incision was made aneurysm was exposed from the renal arteries to the bifurcation. Both common iliac arteries were dissected free down to the origin of the hypogastrics. The left common iliac had a significant kink at its origin due to the size of the aneurysm which was probably what was responsible for the stenosis and high velocity at the origin of the left common iliac. Patient was given 25 g mannitol and heparinized. Aorta was occluded distal to the renal arteries both common iliac arteries occluded distally with vascular clamps. Aneurysm opened anteriorly a large amount of laminated thrombus removed.  There were no patent lumbar vessels. Intermesenteric artery was patent and had good backbleeding. Neck the aneurysm was transected about 2 cm distal to the renal artery. The large neck 8 18 x 9 mm graft was selected. It was anastomosed end and the aortic stump using continuous 3-0 Prolene buttressing this with a strip of felt. Was checked for leaks none were present. There had been some loose atheromatous debris in the neck which was cleaned prior to the anastomosis. Following this both common iliac arteries were transected distally and spatulated anteriorly. They both had posterior plaque but there was a widely patent with good backbleeding. In the end anastomoses were done bilaterally after beveling the graft appropriately a 5-0 Prolene. Right leg was opened initially followed by the left leg. Following this there was no significant hypotension. Protamine was given to reverse the heparin following adequate hemostasis the wound. With saline aneurysm close to graft continuous 3-0 Vicryl. Retroperitoneum approximated with 3-0 Vicryl. Linea alba was then closed using #1 Prolene and the skin with staples. Should be noted that there was excellent backbleeding from the inferior mesenteric artery at the end of the anastomoses and it was ligated with 2-0 silk. Sterile dressing was applied to the recovery room in stable condition. Urinary output was excellent estimated blood loss 400 cc patient received 200 cc of blood from the Cell Saver and one unit packed red blood cells Procedure Details:   [name], MD 10/15/2012 12:13 PM

## 2012-10-15 NOTE — Progress Notes (Signed)
MEDICATION RELATED CONSULT NOTE - INITIAL   Pharmacy consulted for renal antibiotic adjustment.  66yom s/p vascular procedure to receive Zinacef for post surgical prophylaxis. Wt 73kg, CrCl ~76ml/min, no antibiotic allergies. Patient has Zinacef 1.5g IV q12h x 2 doses ordered - doses appropriate. Pharmacy will sign off. Please reconsult if additional assistance is needed. Thanks!!  Cleon Dew, PharmD [phone] 10/15/2012

## 2012-10-15 NOTE — Interval H&P Note (Signed)
History and Physical Interval Note:  10/15/2012 8:15 AM  Mark [name]  has presented today for surgery, with the diagnosis of Abdominal Aortic Aneurysm Left Iliac Artery Stenosis  The various methods of treatment have been discussed with the patient and family. After consideration of risks, benefits and other options for treatment, the patient has consented to  Procedure(s) (LRB) with comments: ANEURYSM ABDOMINAL AORTIC REPAIR (N/A) - Resection and Grafting of Abdominal Aortic Aneurysm - Aortobi-Iliac  as a surgical intervention .  The patient's history has been reviewed, patient examined, no change in status, stable for surgery.  I have reviewed the patient's chart and labs.  Questions were answered to the patient's satisfaction.     [name]

## 2012-10-15 NOTE — OR Nursing (Signed)
reeat abd result with po2 55.6 on 2lnc called to dr lawason/ orders to increase o2 to 4lnc and monitor

## 2012-10-15 NOTE — Anesthesia Postprocedure Evaluation (Signed)
Anesthesia Post Note  Patient: Mark [name]  Procedure(s) Performed: Procedure(s) (LRB): ANEURYSM ABDOMINAL AORTIC REPAIR (N/A)  Anesthesia type: general  Patient location: PACU  Post pain: Pain level controlled  Post assessment: Patient's Cardiovascular Status Stable  Last Vitals:  Filed Vitals:   10/15/12 1413  BP:   Pulse: 72  Temp:   Resp: 13    Post vital signs: Reviewed and stable  Level of consciousness: sedated  Complications: No apparent anesthesia complications

## 2012-10-15 NOTE — Preoperative (Addendum)
Beta Blockers   Reason not to administer Beta Blockers:Not Applicable 

## 2012-10-15 NOTE — Transfer of Care (Signed)
Immediate Anesthesia Transfer of Care Note  Patient: Mark [name]  Procedure(s) Performed: Procedure(s) (LRB) with comments: ANEURYSM ABDOMINAL AORTIC REPAIR (N/A) - Resection and Grafting of Abdominal Aortic Aneurysm - Aortobi-Iliac   Patient Location: PACU  Anesthesia Type:General  Level of Consciousness: awake, sedated and patient cooperative  Airway & Oxygen Therapy: Patient Spontanous Breathing and Patient connected to face mask oxygen  Post-op Assessment: Report given to PACU RN, Post -op Vital signs reviewed and stable, Patient moving all extremities, Patient moving all extremities X 4 and Patient able to stick tongue midline  Post vital signs: Reviewed and stable  Complications: No apparent anesthesia complications

## 2012-10-15 NOTE — Anesthesia Preprocedure Evaluation (Addendum)
Anesthesia Evaluation  Patient identified by MRN, date of birth, ID band Patient awake    Reviewed: Allergy & Precautions, H&P , NPO status , Patient's Chart, lab work & pertinent test results  History of Anesthesia Complications Negative for: history of anesthetic complications  Airway Mallampati: I TM Distance: >3 FB Neck ROM: Full    Dental  (+) Edentulous Upper and Edentulous Lower   Pulmonary shortness of breath and with exertion, COPDCurrent Smoker,          Cardiovascular hypertension, + Peripheral Vascular Disease + dysrhythmias Rhythm:Regular Rate:Normal     Neuro/Psych negative neurological ROS  negative psych ROS   GI/Hepatic   Endo/Other  negative endocrine ROS  Renal/GU Renal InsufficiencyRenal disease     Musculoskeletal   Abdominal Normal abdominal exam  (+)   Peds  Hematology negative hematology ROS (+)   Anesthesia Other Findings   Reproductive/Obstetrics                           Anesthesia Physical Anesthesia Plan  ASA: IV  Anesthesia Plan: General   Post-op Pain Management:    Induction: Intravenous  Airway Management Planned: Oral ETT  Additional Equipment: Arterial line, CVP and PA Cath  Intra-op Plan:   Post-operative Plan: Possible Post-op intubation/ventilation  Informed Consent: I have reviewed the patients History and Physical, chart, labs and discussed the procedure including the risks, benefits and alternatives for the proposed anesthesia with the patient or authorized representative who has indicated his/her understanding and acceptance.     Plan Discussed with: CRNA and Surgeon  Anesthesia Plan Comments:        Anesthesia Quick Evaluation

## 2012-10-15 NOTE — H&P (View-Only) (Signed)
Subjective:     Patient ID: Mark [name], male   DOB: 04/08/1946, 67 y.o.   MRN: [id number]  HPI this 28 -year-old male returns to discuss further treatment of his large abdominal aortic aneurysm. The Cardiolite performed which was excellent with a good ejection fraction and no evidence of ischemia. CT scan was performed yesterday which are reviewed by computer. He has a 7.5 cm infrarenal abdominal aortic aneurysm which extends up to the renal arteries-he is not a candidate for aortic stent grafting. He continues to have left buttock claudication symptoms in her lower extremity arterial Dopplers performed today.  Past Medical History  Diagnosis Date  . Hypertension   . GERD (gastroesophageal reflux disease)   . Hyperlipidemia   . Chronic kidney disease   . Atrial fibrillation     History  Substance Use Topics  . Smoking status: Current Every Day Smoker -- 1.0 packs/day for 50 years    Types: Cigarettes  . Smokeless tobacco: Never Used  . Alcohol Use: No    Family History  Problem Relation Age of Onset  . Diabetes Mother   . Heart disease Father   . Heart attack Father     Allergies  Allergen Reactions  . Niaspan (Niacin Er) Anaphylaxis    Current outpatient prescriptions:amLODipine (NORVASC) 10 MG tablet, Take 10 mg by mouth daily., Disp: , Rfl: ;  aspirin 81 MG tablet, Take 81 mg by mouth daily., Disp: , Rfl: ;  atorvastatin (LIPITOR) 40 MG tablet, Take 40 mg by mouth daily., Disp: , Rfl: ;  losartan (COZAAR) 100 MG tablet, Take 100 mg by mouth daily., Disp: , Rfl: ;  omeprazole (PRILOSEC) 40 MG capsule, Take 40 mg by mouth daily., Disp: , Rfl:   BP 171/108  Pulse 80  Resp 20  Ht 5' 6.5" (1.689 m)  Wt 158 lb (71.668 kg)  BMI 25.12 kg/m2  Body mass index is 25.12 kg/(m^2).           Review of Systems     Objective:   Physical Exam blood pressure 171 of 108 heart rate 80 respirations 20 General well-developed well-nourished male no apparent stress alert  and oriented x3 Lungs no rhonchi or wheezing Cardiovascular regular rhythm no murmurs carotid pulses 3+ no bruits Abdomen soft with large pulsatile mass approximating 7 cm-nontender 3+ femoral and posterior tibial pulse on the right 2+ femoral posterior tibial pulse on the left  Today I reviewed the CT scan by computer and also reviewed lower extremity arterial Doppler studies which interpreted performed today in the lab. ABI on the left is 0.91 on the right is 1.09. 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 Patient would like to proceed on Wednesday

## 2012-10-15 NOTE — Anesthesia Procedure Notes (Signed)
Procedures Procedures: Right IJ Mark [name] Catheter Insertion:[id number]: The patient was identified and consent obtained.  TO was performed, and full barrier precautions were used.  The skin was anesthetized with lidocaine-4cc plain with 25g needle.  Once the vein was located with the 22 ga. needle using ultrasound guidance , the wire was inserted into the vein.  The wire location was confirmed with ultrasound.  The tissue was dilated and the 8.5 [place] cordis catheter was carefully inserted. Afterwards Mark [name] catheter was inserted. PA catheter at 49cm.  The patient tolerated the procedure well.   CE

## 2012-10-16 ENCOUNTER — Inpatient Hospital Stay (HOSPITAL_COMMUNITY): Payer: Medicare Other

## 2012-10-16 ENCOUNTER — Encounter (HOSPITAL_COMMUNITY): Payer: Self-pay | Admitting: Vascular Surgery

## 2012-10-16 LAB — COMPREHENSIVE METABOLIC PANEL
AST: 18 U/L (ref 0–37)
Albumin: 2.9 g/dL — ABNORMAL LOW (ref 3.5–5.2)
BUN: 23 mg/dL (ref 6–23)
Calcium: 8.3 mg/dL — ABNORMAL LOW (ref 8.4–10.5)
Chloride: 104 mEq/L (ref 96–112)
Creatinine, Ser: 2.21 mg/dL — ABNORMAL HIGH (ref 0.50–1.35)
Total Bilirubin: 0.4 mg/dL (ref 0.3–1.2)
Total Protein: 5.5 g/dL — ABNORMAL LOW (ref 6.0–8.3)

## 2012-10-16 LAB — MAGNESIUM: Magnesium: 1.9 mg/dL (ref 1.5–2.5)

## 2012-10-16 LAB — CBC
MCHC: 34.1 g/dL (ref 30.0–36.0)
MCV: 92.5 fL (ref 78.0–100.0)
Platelets: 74 10*3/uL — ABNORMAL LOW (ref 150–400)
RDW: 13.4 % (ref 11.5–15.5)
WBC: 12 10*3/uL — ABNORMAL HIGH (ref 4.0–10.5)

## 2012-10-16 LAB — AMYLASE: Amylase: 56 U/L (ref 0–105)

## 2012-10-16 MED ORDER — NALOXONE HCL 0.4 MG/ML IJ SOLN: 0.4000 mg | INTRAMUSCULAR | Status: DC | PRN

## 2012-10-16 MED ORDER — SODIUM CHLORIDE 0.9 % IV BOLUS (SEPSIS)
1000.0000 mL | Freq: Once | INTRAVENOUS | Status: AC
  Administered 2012-10-16: 1000 mL via INTRAVENOUS

## 2012-10-16 MED ORDER — DEXTROSE-NACL 5-0.9 % IV SOLN
INTRAVENOUS | Status: DC
  Administered 2012-10-16 – 2012-10-17 (×3): via INTRAVENOUS
  Administered 2012-10-18: 100 mL/h via INTRAVENOUS

## 2012-10-16 MED ORDER — SODIUM CHLORIDE 0.9 % IJ SOLN: 9.0000 mL | INTRAMUSCULAR | Status: DC | PRN

## 2012-10-16 MED ORDER — MORPHINE SULFATE (PF) 1 MG/ML IV SOLN
INTRAVENOUS | Status: DC
  Administered 2012-10-16: 22:00:00 via INTRAVENOUS
  Administered 2012-10-16: 7 mg via INTRAVENOUS
  Administered 2012-10-16: 2 mg via INTRAVENOUS
  Administered 2012-10-16: 8 mg via INTRAVENOUS
  Administered 2012-10-16: 10:00:00 via INTRAVENOUS
  Administered 2012-10-16: 3 mg via INTRAVENOUS
  Administered 2012-10-17 (×3): 5 mg via INTRAVENOUS
  Filled 2012-10-16 (×2): qty 25

## 2012-10-16 MED ORDER — SODIUM CHLORIDE 0.9 % IV SOLN
INTRAVENOUS | Status: DC
  Administered 2012-10-16: 500 mL via INTRAVENOUS

## 2012-10-16 MED ORDER — DIPHENHYDRAMINE HCL 50 MG/ML IJ SOLN: 12.5000 mg | Freq: Four times a day (QID) | INTRAMUSCULAR | Status: DC | PRN

## 2012-10-16 MED ORDER — DIPHENHYDRAMINE HCL 12.5 MG/5ML PO ELIX
12.5000 mg | ORAL_SOLUTION | Freq: Four times a day (QID) | ORAL | Status: DC | PRN
  Filled 2012-10-16: qty 5

## 2012-10-16 NOTE — Progress Notes (Signed)
Pts. Urine output still decrease after 500cc NS IV bolus was given during change of shift. Dr. Imogene Burn was notified with orders made for another IV bolus 1L NS. Will cont. to monitor urine output.

## 2012-10-16 NOTE — Progress Notes (Signed)
Utilization Review Completed.  10/16/2012  

## 2012-10-16 NOTE — Progress Notes (Addendum)
[facility] [place]  Post-op  Intra-abdominal Surgery note  Date of Surgery: 10/15/2012  Surgeon(s): Pryor Ochoa, MD Larina Earthly, MD  1 Day Post-Op Procedure(s): ANEURYSM ABDOMINAL AORTIC REPAIR  History of Present Illness  Mark [name] is a 67 y.o. male who is  up s/p Procedure(s): ANEURYSM ABDOMINAL AORTIC REPAIR Pt is doing well. complains of incisional pain; denies nausea/vomiting; denies diarrhea. has not had flatus;has not had BM  IMAGING: Dg Chest 2 View  10/14/2012  *RADIOLOGY REPORT*  Clinical Data: Preop, hypertension.  CHEST - 2 VIEW  Comparison: None.  Findings: Mild hyperinflation of the lungs. Heart and mediastinal contours are within normal limits.  No focal opacities or effusions.  No acute bony abnormality.  IMPRESSION: Mild hyperinflation.  No acute findings.   Original Report Authenticated By: Charlett Nose, M.D.    Dg Chest Portable 1 View  10/15/2012  *RADIOLOGY REPORT*  Clinical Data: Line placement.  PORTABLE CHEST - 1 VIEW  Comparison: 10/14/2012  Findings: Swan-Ganz catheter is in the central right pulmonary artery.  NG tube enters the stomach.  No pneumothorax.  Minimal left base atelectasis.  No effusions.  Heart is normal size.  IMPRESSION: Right Swan-Ganz catheter tip in the central right pulmonary artery. No pneumothorax.  Left base atelectasis.   Original Report Authenticated By: Charlett Nose, M.D.    Dg Abd Portable 1v  10/15/2012  *RADIOLOGY REPORT*  Clinical Data: Postop AAA.  PORTABLE ABDOMEN - 1 VIEW  Comparison: CT 10/10/2012  Findings: Midline skin staples noted.  NG tube is in the proximal stomach.  Nonobstructive bowel gas pattern.  No supine evidence of free air.  No acute bony abnormality.  No organomegaly or suspicious calcification.  IMPRESSION: Postoperative changes.   Original Report Authenticated By: Charlett Nose, M.D.     Significant Diagnostic Studies: CBC Lab Results  Component Value Date   WBC 12.0* 10/16/2012    HGB 10.9* 10/16/2012   HCT 32.0* 10/16/2012   MCV 92.5 10/16/2012   PLT 74* 10/16/2012    BMET    Component Value Date/Time   NA 138 10/16/2012 0450   K 4.9 10/16/2012 0450   CL 104 10/16/2012 0450   CO2 26 10/16/2012 0450   GLUCOSE 136* 10/16/2012 0450   BUN 23 10/16/2012 0450   CREATININE 2.21* 10/16/2012 0450   CALCIUM 8.3* 10/16/2012 0450   GFRNONAA 29* 10/16/2012 0450   GFRAA 33* 10/16/2012 0450    COAG Lab Results  Component Value Date   INR 1.21 10/15/2012   INR 1.00 10/14/2012   No results found for this basename: PTT    I/O last 3 completed shifts: In: 4362.3 [I.V.:3281.3; Blood:401; NG/GT:120; IV Piggyback:560] Out: 2595 [Urine:1870; Emesis/NG output:225; Blood:500]    Physical Examination BP Readings from Last 3 Encounters:  10/16/12 118/69  10/16/12 118/69  10/14/12 152/99   Temp Readings from Last 3 Encounters:  10/16/12 99.1 F (37.3 C)   10/16/12 99.1 F (37.3 C)   10/14/12 97.9 F (36.6 C)    SpO2 Readings from Last 3 Encounters:  10/16/12 94%  10/16/12 94%  10/14/12 96%   Pulse Readings from Last 3 Encounters:  10/16/12 88  10/16/12 88  10/14/12 85    General: A&O x 3, WDWN male in NAD Pulmonary: normal non-labored breathing , without Rales, rhonchi,  wheezing Cardiac: Heart rate : regular ,  Abdomen:abdomen soft and no BS Abdominal wound:clean, dry, intact  Neurologic: A&O X 3; Appropriate Affect ; SENSATION: normal;  MOTOR FUNCTION:  moving all extremities equally. Speech is fluent/normal  Vascular Exam:BLE warm and well perfused Extremities without ischemic changes, no Gangrene, no cellulitis; no open wounds;   LOWER EXTREMITY PULSES           RIGHT                                      LEFT      POSTERIOR TIBIAL  palpable  palpable       DORSALIS PEDIS      ANTERIOR TIBIAL  palpable  palpable   Assessment/Plan: Mark [name] is a 67 y.o. male who is 1 Day Post-Op Procedure(s): ANEURYSM ABDOMINAL AORTIC REPAIR Pt stable Cr 2.21 Dc  Swan and aline OOB PCA      Mar[place]e [place] [phone] 10/16/2012 7:56 AM      Agree with above assessment Abdomen relatively soft with appropriate tenderness 3+ dorsalis pedis pulses palpable bilaterally NG drainage only 200 cc  surgery with stable hematocrit Creatinine 2.2-near baseline with excellent urinary output  Plan DC NG tube-strict n.p.o. DC Swan and A-line as above Begin to mobilize patient Doing very well

## 2012-10-16 NOTE — Evaluation (Signed)
Physical Therapy Evaluation Patient Details Name: Mark [name] MRN: [id number] DOB: Nov 26, 1945 Today's Date: 10/16/2012 Time: 1478-2956 PT Time Calculation (min): 31 min  PT Assessment / Plan / Recommendation Clinical Impression  pt adm with newly discovered AAA.  On eval pt was stiff and painful with mild gait instability and pt could benefit from PT to maximize I.    PT Assessment  Patient needs continued PT services    Follow Up Recommendations  No PT follow up;Supervision for mobility/OOB    Does the patient have the potential to tolerate intense rehabilitation      Barriers to Discharge        Equipment Recommendations  None recommended by PT    Recommendations for Other Services     Frequency Min 3X/week    Precautions / Restrictions Precautions Precautions: Fall   Pertinent Vitals/Pain EHr 100-110's; sats on RA fell to 87/88%, on 2L [name] sats stayed at 92%      Mobility  Bed Mobility Bed Mobility: Supine to Sit;Sitting - Scoot to Edge of Bed Supine to Sit: 4: Min guard;HOB elevated Sitting - Scoot to [name]: 4: Min guard Details for Bed Mobility Assistance: struggle, but no assist needed Transfers Transfers: Sit to Stand;Stand to Sit Sit to Stand: 4: Min guard;From elevated surface;From bed Stand to Sit: 4: Min guard;With upper extremity assist;To chair/3-in-1 Details for Transfer Assistance: safe technique Ambulation/Gait Ambulation/Gait Assistance: 4: Min guard Ambulation Distance (Feet): 380 Feet Assistive device: None Ambulation/Gait Assistance Details: stiff guarded gait with short step length and wandering to the left. Gait Pattern: Step-through pattern;Decreased step length - right;Decreased step length - left;Decreased stride length Stairs: No Wheelchair Mobility Wheelchair Mobility: No    Shoulder Instructions     Exercises     PT Diagnosis: Generalized weakness;Acute pain  PT Problem List: Decreased strength;Decreased activity  tolerance;Decreased balance;Decreased mobility;Cardiopulmonary status limiting activity PT Treatment Interventions: Gait training;Functional mobility training;Therapeutic activities;Balance training;Patient/family education   PT Goals Acute Rehab PT Goals PT Goal Formulation: With patient Time For Goal Achievement: 10/23/12 Potential to Achieve Goals: Good Pt will go Supine/Side to Sit: with modified independence;with HOB 0 degrees PT Goal: Supine/Side to Sit - Progress: Goal set today Pt will go Sit to Stand: with modified independence;with upper extremity assist PT Goal: Sit to Stand - Progress: Goal set today Pt will Transfer Bed to Chair/Chair to Bed: with modified independence PT Transfer Goal: Bed to Chair/Chair to Bed - Progress: Goal set today Pt will Ambulate: >150 feet;Independently PT Goal: Ambulate - Progress: Goal set today Pt will Go Up / Down Stairs: 6-9 stairs;with modified independence;with rail(s) PT Goal: Up/Down Stairs - Progress: Goal set today  Visit Information  Last PT Received On: 10/16/12 Assistance Needed: +1 PT/OT Co-Evaluation/Treatment: Yes    Subjective Data  Subjective: How long do I have to sit here? Patient Stated Goal: Home I   Prior Functioning  Home Living Lives With: Spouse Available Help at Discharge: Family Type of Home: House Home Access: Stairs to enter Secretary/administrator of Steps: 6 Entrance Stairs-Rails: Right;Left Home Layout: One level Bathroom Shower/Tub: Health visitor: Handicapped height Home Adaptive Equipment: [name] Prior Function Level of Independence: Independent Driving: Yes Communication Communication: HOH Dominant Hand: Left    Cognition  Overall Cognitive Status: Appears within functional limits for tasks assessed/performed Arousal/Alertness: Awake/alert Behavior During Session: [name] for tasks performed    Extremity/Trunk Assessment Right Lower  Extremity Assessment RLE ROM/Strength/Tone: [facility] for tasks assessed  Left Lower Extremity Assessment LLE ROM/Strength/Tone: WFL for tasks assessed Trunk Assessment Trunk Assessment: Normal   Balance    End of Session PT - End of Session Equipment Utilized During Treatment: Gait belt;Oxygen Activity Tolerance: Patient tolerated treatment well Patient left: in chair;with call bell/phone within reach Nurse Communication: Mobility status  GP     Liliah Dorian, Eliseo Gum 10/16/2012, 4:30 PM

## 2012-10-16 NOTE — Evaluation (Signed)
Occupational Therapy Evaluation Patient Details Name: Mark [name] MRN: [id number] DOB: [birth year] Today's Date: 10/16/2012 Time: 4098-1191 OT Time Calculation (min): 31 min  OT Assessment / Plan / Recommendation Clinical Impression  Pt s/p aneurysm abdoominal aortic repair.  Pt overall moving well but limited by pain and stiffness. Will benefit from acute OT services to address below problem list in prep for return home.    OT Assessment  Patient needs continued OT Services    Follow Up Recommendations  No OT follow up    Barriers to Discharge None    Equipment Recommendations   (tbd)    Recommendations for Other Services    Frequency  Min 2X/week    Precautions / Restrictions Precautions Precautions: Fall   Pertinent Vitals/Pain On RA, pt O2 sats drop to 87% at rest.  Re-applied 2L O2 [place] and pt O2 sats >92% at rest and with activity.    ADL  Grooming: Performed;Wash/dry face;Set up Where Assessed - Grooming: Unsupported sitting Upper Body Bathing: Simulated;Set up Where Assessed - Upper Body Bathing: Unsupported sitting Lower Body Bathing: Simulated;Minimal assistance Where Assessed - Lower Body Bathing: Unsupported sit to stand Upper Body Dressing: Simulated;Set up Where Assessed - Upper Body Dressing: Unsupported sitting Lower Body Dressing: Simulated;Minimal assistance Where Assessed - Lower Body Dressing: Unsupported sit to stand Toilet Transfer: Simulated;Min guard Toilet Transfer Method: Sit to Barista:  (bed, ambulated in hall, return to chair in room) Equipment Used: Gait belt Transfers/Ambulation Related to ADLs: min guard during ambulation in hall without device. Ambulated ~380 ft.  Pt tends to veer to left while ambulating, but no LOB. ADL Comments: Limited by abdominal pain.  Educated pt on correct use of incentive spirometer and pt able to return demonstrations with min verbal cueing for technique.     OT Diagnosis: Generalized  weakness;Acute pain  OT Problem List: Decreased strength;Decreased activity tolerance;Decreased knowledge of use of DME or AE;Pain OT Treatment Interventions: Self-care/ADL training;DME and/or AE instruction;Therapeutic activities;Patient/family education   OT Goals Acute Rehab OT Goals OT Goal Formulation: With patient Time For Goal Achievement: 10/23/12 Potential to Achieve Goals: Good ADL Goals Pt Will Perform Grooming: with modified independence;Standing at sink ADL Goal: Grooming - Progress: Goal set today Pt Will Perform Lower Body Dressing: with modified independence;Sit to stand from chair;Sit to stand from bed ADL Goal: Lower Body Dressing - Progress: Goal set today Pt Will Transfer to Toilet: with modified independence;Ambulation;Comfort height toilet ADL Goal: Toilet Transfer - Progress: Goal set today Pt Will Perform Toileting - Clothing Manipulation: with modified independence;Standing ADL Goal: Toileting - Clothing Manipulation - Progress: Goal set today Miscellaneous OT Goals Miscellaneous OT Goal #1: Pt will perform all functional mobility at mod I level. OT Goal: Miscellaneous Goal #1 - Progress: Goal set today  Visit Information  Last OT Received On: 10/16/12 Assistance Needed: +1 PT/OT Co-Evaluation/Treatment: Yes    Subjective Data      Prior Functioning     Home Living Lives With: Spouse Available Help at Discharge: Family Type of Home: House Home Access: Stairs to enter Secretary/administrator of Steps: 6 Entrance Stairs-Rails: Right;Left Home Layout: One level Bathroom Shower/Tub: Health visitor: Handicapped height Home Adaptive Equipment: [name] Prior Function Level of Independence: Independent Driving: Yes Communication Communication: HOH Dominant Hand: Left         Vision/Perception     Cognition  Overall Cognitive Status: Appears within functional limits for tasks  assessed/performed Arousal/Alertness: Awake/alert Behavior  During Session: [facility] for tasks performed    Extremity/Trunk Assessment Right Upper Extremity Assessment RUE ROM/Strength/Tone: [facility] for tasks assessed Left Upper Extremity Assessment LUE ROM/Strength/Tone: [facility] for tasks assessed Right Lower Extremity Assessment RLE ROM/Strength/Tone: [facility] for tasks assessed Left Lower Extremity Assessment LLE ROM/Strength/Tone: [facility] for tasks assessed Trunk Assessment Trunk Assessment: Normal     Mobility Bed Mobility Bed Mobility: Supine to Sit;Sitting - Scoot to Edge of Bed Supine to Sit: 4: Min guard;HOB elevated Sitting - Scoot to [name]: 4: Min guard Details for Bed Mobility Assistance: struggle, but no assist needed Transfers Transfers: Sit to Stand;Stand to Sit Sit to Stand: 4: Min guard;From elevated surface;From bed Stand to Sit: 4: Min guard;With upper extremity assist;To chair/3-in-1 Details for Transfer Assistance: safe technique     Shoulder Instructions     Exercise     Balance     End of Session OT - End of Session Equipment Utilized During Treatment: Gait belt Activity Tolerance: Patient tolerated treatment well Patient left: in chair;with call bell/phone within reach Nurse Communication: Mobility status  GO    10/16/2012 Cipriano Mile OTR/L Pager [phone] Office [phone]  Cipriano Mile 10/16/2012, 4:40 PM

## 2012-10-16 NOTE — Clinical Documentation Improvement (Signed)
Anemia Blood Loss Clarification  THIS DOCUMENT IS NOT A PERMANENT PART OF THE MEDICAL RECORD  RESPOND TO THE THIS QUERY, FOLLOW THE INSTRUCTIONS BELOW:  1. If needed, update documentation for the patient's encounter via the notes activity.  2. Access this query again and click edit on the In [employer].  3. After updating, or not, click F2 to complete all highlighted (required) fields concerning your review. Select "additional documentation in the medical record" OR "no additional documentation provided".  4. Click Sign note button.  5. The deficiency will fall out of your In Basket *Please let us know if you are not able to complete this workflow by phone or e-mail (listed below).        10/16/12  Dear Dr. Hart Rochester Marton Redwood  In an effort to better capture your patient's severity of illness, reflect appropriate length of stay and utilization of resources, a review of the patient medical record has revealed the following indicators.    Based on your clinical judgment, please clarify and document in a progress note and/or discharge summary the clinical condition associated with the following supporting information:  In responding to this query please exercise your independent judgment.  The fact that a query is asked, does not imply that any particular answer is desired or expected.   Possible Clinical Conditions?   " Expected Acute Blood Loss Anemia  " Acute Blood Loss Anemia  " Acute on chronic blood loss anemia  " Precipitous drop in Hematocrit  " Other Condition________________  " Cannot Clinically Determine    Supporting Information:  Signs and Symptoms: EBL: per 01/29 Anesthesia record.  Diagnostics: H&H on 01/28:   12.9/37.8 H&H on 01/29:     8.5/25.0   Transfusion: Transfuse with 1 unit PRBC per 01/29 Op note. Cell saver: per 01/29 Anesthesia record.  IV fluids / plasma expanders: Albumin human, 5%:537ml per 01/29 Anesthesia  record.   Reviewed: Additional documentation provided per 01/31 progress notes.                                           Thank You,  Marciano Sequin,  Clinical Documentation Specialist:  Pager: [phone]  Phone: [phone]  Health Information Management [place]

## 2012-10-16 NOTE — Clinical Documentation Improvement (Signed)
CKD DOCUMENTATION CLARIFICATION QUERY   THIS DOCUMENT IS NOT A PERMANENT PART OF THE MEDICAL RECORD  TO RESPOND TO THE THIS QUERY, FOLLOW THE INSTRUCTIONS BELOW:  1. If needed, update documentation for the patient's encounter via the notes activity.  2. Access this query again and click edit on the In [employer].  3. After updating, or not, click F2 to complete all highlighted (required) fields concerning your review. Select "additional documentation in the medical record" OR "no additional documentation provided".  4. Click Sign note button.  5. The deficiency will fall out of your In Basket *Please let us know if you are not able to complete this workflow by phone or e-mail (listed below).  Please update your documentation within the medical record to reflect your response to this query.                                                                                        10/16/12   Dear Dr. Selinda Michaels,  In a better effort to capture your patient's severity of illness, reflect appropriate length of stay and utilization of resources, a review of the patient medical record has revealed the following indicators.    Based on your clinical judgment, please clarify and document in a progress note and/or discharge summary the clinical condition associated with the following supporting information:  In responding to this query please exercise your independent judgment.  The fact that a query is asked, does not imply that any particular answer is desired or expected.   Possible Clinical Conditions?   ____CKD Stage III - GFR 30-59  ____CKD Stage IV - GFR 15-29  ____CKD Stage V - GFR < 15  ____Other condition  ____Cannot Clinically determine     Risk Factors:  Patient with a history of chronic renal insufficiency and chronic kidney disease per 10/13/12 H&P. Please document stage of chronic kidney disease, if known, in the progress notes and discharge summary.   Lab:   Bun:  01/30:   23 01/28:   28  Creatinine: 01/30:   2.21 01/28:   2.17   GFR:  01/30:    29 01/28:    30   You may use possible, probable, or suspect with inpatient documentation. possible, probable, suspected diagnoses MUST be documented at the time of discharge  Reviewed: No additional documentation provided.                                        Thank You,  Marciano Sequin,  Clinical Documentation Specialist:  Pager: [phone]  Phone: [phone]  Health Information Management [place]

## 2012-10-17 LAB — CBC
HCT: 30.6 % — ABNORMAL LOW (ref 39.0–52.0)
Hemoglobin: 10.2 g/dL — ABNORMAL LOW (ref 13.0–17.0)
MCH: 31.7 pg (ref 26.0–34.0)
MCV: 95 fL (ref 78.0–100.0)
RBC: 3.22 MIL/uL — ABNORMAL LOW (ref 4.22–5.81)

## 2012-10-17 LAB — BASIC METABOLIC PANEL
CO2: 27 mEq/L (ref 19–32)
Glucose, Bld: 144 mg/dL — ABNORMAL HIGH (ref 70–99)
Potassium: 4.8 mEq/L (ref 3.5–5.1)
Sodium: 140 mEq/L (ref 135–145)

## 2012-10-17 MED ORDER — AMLODIPINE BESYLATE 10 MG PO TABS
10.0000 mg | ORAL_TABLET | Freq: Every day | ORAL | Status: DC
  Administered 2012-10-17 – 2012-10-18 (×2): 10 mg via ORAL
  Filled 2012-10-17 (×4): qty 1

## 2012-10-17 MED ORDER — OXYCODONE-ACETAMINOPHEN 5-325 MG PO TABS
1.0000 | ORAL_TABLET | ORAL | Status: DC | PRN
Start: 2012-10-17 — End: 2012-10-19
  Administered 2012-10-18 (×2): 2 via ORAL
  Administered 2012-10-18: 1 via ORAL
  Filled 2012-10-17: qty 2
  Filled 2012-10-17: qty 1
  Filled 2012-10-17: qty 2

## 2012-10-17 MED ORDER — LOSARTAN POTASSIUM 50 MG PO TABS
100.0000 mg | ORAL_TABLET | Freq: Every morning | ORAL | Status: DC
  Administered 2012-10-17 – 2012-10-19 (×3): 100 mg via ORAL
  Filled 2012-10-17 (×3): qty 2

## 2012-10-17 MED ORDER — BISACODYL 10 MG RE SUPP
10.0000 mg | Freq: Once | RECTAL | Status: AC
  Administered 2012-10-17: 10 mg via RECTAL
  Filled 2012-10-17: qty 1

## 2012-10-17 MED ORDER — LOSARTAN POTASSIUM 50 MG PO TABS: 100.0000 mg | ORAL_TABLET | Freq: Every morning | ORAL | Status: DC

## 2012-10-17 MED FILL — Sodium Chloride IV Soln 0.9%: INTRAVENOUS | Qty: 2000 | Status: AC

## 2012-10-17 MED FILL — Heparin Sodium (Porcine) Inj 1000 Unit/ML: INTRAMUSCULAR | Qty: 30 | Status: AC

## 2012-10-17 NOTE — Progress Notes (Signed)
After 1L NS bolus was given pt. had  60ml of urine output for 2 hours. Dr. Imogene Burn was notified, will cont. to monitor urine output.

## 2012-10-17 NOTE — Progress Notes (Addendum)
[facility] [place]  Post-op  Intra-abdominal Surgery note  Date of Surgery: 10/15/2012  Surgeon(s): Pryor Ochoa, MD Larina Earthly, MD  2 Days Post-Op Procedure(s): ANEURYSM ABDOMINAL AORTIC REPAIR  History of Present Illness  Mark [name] is a 67 y.o. male who is  up s/p Procedure(s): ANEURYSM ABDOMINAL AORTIC REPAIR Pt is doing well. denies incisional pain; denies nausea/vomiting; denies diarrhea. has not had flatus;has not had BM. Pt belching Ambulating around ICU. States he is not using PCA much.   Significant Diagnostic Studies: CBC Lab Results  Component Value Date   WBC 12.5* 10/17/2012   HGB 10.2* 10/17/2012   HCT 30.6* 10/17/2012   MCV 95.0 10/17/2012   PLT 59* 10/17/2012    BMET    Component Value Date/Time   NA 140 10/17/2012 0400   K 4.8 10/17/2012 0400   CL 109 10/17/2012 0400   CO2 27 10/17/2012 0400   GLUCOSE 144* 10/17/2012 0400   BUN 23 10/17/2012 0400   CREATININE 2.22* 10/17/2012 0400   CALCIUM 8.2* 10/17/2012 0400   GFRNONAA 29* 10/17/2012 0400   GFRAA 33* 10/17/2012 0400    COAG Lab Results  Component Value Date   INR 1.21 10/15/2012   INR 1.00 10/14/2012   No results found for this basename: PTT    I/O last 3 completed shifts: In: 4822.9 [I.V.:4632.9; NG/GT:90; IV Piggyback:100] Out: 1395 [Urine:1195; Emesis/NG output:200] Total I/O In: 100 [I.V.:100] Out: 30 [Urine:30]  Physical Examination BP Readings from Last 3 Encounters:  10/17/12 132/69  10/17/12 132/69  10/14/12 152/99   Temp Readings from Last 3 Encounters:  10/17/12 98.7 F (37.1 C) Oral  10/17/12 98.7 F (37.1 C) Oral  10/14/12 97.9 F (36.6 C)    SpO2 Readings from Last 3 Encounters:  10/17/12 96%  10/17/12 96%  10/14/12 96%   Pulse Readings from Last 3 Encounters:  10/17/12 108  10/17/12 108  10/14/12 85    General: A&O x 3, WDWN male in NAD Pulmonary: normal non-labored breathing , without Rales, rhonchi,  wheezing Cardiac: Heart rate  : regular ,  Abdomen:abdomen mildly distended, non-tender and minimal BS  Abdominal wound:clean, dry, intact  Neurologic: A&O X 3; Appropriate Affect ; SENSATION: normal; MOTOR FUNCTION:  moving all extremities equally. Speech is fluent/normal Vascular Exam:BLE warm and well perfused Extremities without ischemic changes, no Gangrene, no cellulitis; no open wounds;   LOWER EXTREMITY PULSES           RIGHT                                      LEFT      POSTERIOR TIBIAL  palpable  palpable       DORSALIS PEDIS      ANTERIOR TIBIAL  palpable  palpable    Assessment/Plan: Mark [name] is a 67 y.o. male who is 2 Days Post-Op Procedure(s): ANEURYSM ABDOMINAL AORTIC REPAIR Pt doing well Acute post -op anemia stable CKD CR stable - cont fluids DC Foley Ambulate Transfer to 2000 Will give suppository today to stimulate bowel function Sips with meds only Thrombocytopenia - PLT 59 - will repeat in am, if not recovering will order HIT panel, not on ASA at this time or heparin  Mark [name] J [phone] 10/17/2012 9:59 AM      Agree with above assessment Abdomen soft nontender 3+ dorsalis pedis pulses palpable bilaterally  Generally doing very well we'll transfer to 2000 and much platelet count. Renal function is stable with creatinine 2.2 which is same as preop and good urinary output

## 2012-10-17 NOTE — Progress Notes (Signed)
Report called and RN met at bedside. Patient walked with belongings to 2014.  Attached to tele monitor without complications.  Dentures remain with patient.

## 2012-10-18 ENCOUNTER — Encounter (HOSPITAL_COMMUNITY): Payer: Self-pay | Admitting: *Deleted

## 2012-10-18 LAB — CBC
MCHC: 33.1 g/dL (ref 30.0–36.0)
Platelets: 75 10*3/uL — ABNORMAL LOW (ref 150–400)
RDW: 13.6 % (ref 11.5–15.5)
WBC: 9.8 10*3/uL (ref 4.0–10.5)

## 2012-10-18 LAB — BASIC METABOLIC PANEL
BUN: 23 mg/dL (ref 6–23)
Calcium: 8.6 mg/dL (ref 8.4–10.5)
Chloride: 110 mEq/L (ref 96–112)
Creatinine, Ser: 2.38 mg/dL — ABNORMAL HIGH (ref 0.50–1.35)
GFR calc Af Amer: 31 mL/min — ABNORMAL LOW (ref >90)
GFR calc non Af Amer: 26 mL/min — ABNORMAL LOW (ref >90)

## 2012-10-18 NOTE — Progress Notes (Signed)
Nutrition Brief Note  Patient identified on the Malnutrition Screening Tool (MST) Report  Per pt he is unsure if he has had any weight loss. Per pt he has been about 160 lb. Current weight is 158 lb. He reports having weighed as much as 175 lb a couple of years ago but denies any recent weight changes. Pt reports good appetite at home PTA.   Body mass index is 25.22 kg/(m^2). BMI is WNL.  Current diet order is NPO, per RN diet will be advanced today to FLD. Awaiting orders from MD. Labs and medications reviewed.   No nutrition interventions warranted at this time. If nutrition issues arise, please consult RD.   Kendell Bane RD, LDN, CNSC [phone] Weekend/After Hours Pager

## 2012-10-18 NOTE — Progress Notes (Signed)
Subjective: Interval History: none.. tolerating clear liquids Objective: Vital signs in last 24 hours: Temp:  [98.3 F (36.8 C)-99.2 F (37.3 C)] 98.3 F (36.8 C) (02/01 0343) Pulse Rate:  [103-116] 116  (02/01 0343) Resp:  [18-20] 18  (02/01 0343) BP: (129-168)/(77-83) 168/82 mmHg (02/01 0343) SpO2:  [93 %-96 %] 96 % (02/01 0343) Weight:  [158 lb 9.6 oz (71.94 kg)] 158 lb 9.6 oz (71.94 kg) (02/01 0343)  Intake/Output from previous day: 01/31 0701 - 02/01 0700 In: 500 [I.V.:500] Out: 716 [Urine:715; Stool:1] Intake/Output this shift:    Incision healing well. Palpable femoral and posterior tibial pulses  Lab Results:  Basename 10/18/12 0630 10/17/12 0400  WBC 9.8 12.5*  HGB 9.1* 10.2*  HCT 27.5* 30.6*  PLT 75* 59*   BMET  Basename 10/18/12 0630 10/17/12 0400  NA 140 140  K 4.5 4.8  CL 110 109  CO2 25 27  GLUCOSE 133* 144*  BUN 23 23  CREATININE 2.38* 2.22*  CALCIUM 8.6 8.2*    Studies/Results: Ct Abdomen Pelvis Wo Contrast  10/10/2012  *RADIOLOGY REPORT*  Clinical Data: Evaluate abdominal aortic aneurysm seen on recent ultrasound.  No history of cancer.  Chronic renal disease.  CT ABDOMEN AND PELVIS WITHOUT CONTRAST  Technique:  Multidetector CT imaging of the abdomen and pelvis was performed following the standard protocol without intravenous contrast.  Comparison: Ultrasound 10/06/2012  Findings: The finding of note is an abdominal aortic aneurysm which originates immediately below the takeoff of the renal arteries and extends to the iliac bifurcation.  This measures 12.6 cm in length, 7.7 cm in depth and 7.3 cm in width.  The intraluminal clot visualized on prior ultrasound is faintly visualized but better demonstrated with ultrasound due to the lack of enhancement on today's exam. No periaortic inflammatory change or fluid is seen. A small amount of luminal calcification is noted.  The lung bases are notable for an 8 mm pleural-based nodule in the right middle lobe.   The remainder of the lung bases are clear.  The liver, gallbladder, spleen, pancreas and adrenal glands have an unremarkable noncontrasted appearance.  Multiple bilateral renal cysts are seen are identified.  One hyperattenuating circumscribed lesion is seen in the posterior mid aspect of the left kidney measuring 18 mm and a second circumscribed hyperattenuating lesion is seen in the left upper pole measuring 8 mm.  Pelvic structures appear within normal limits.  Sigmoid diverticulosis is identified.  No other focal bowel abnormalities are noted.  A small fat containing right inguinal hernia is seen.  No intra-abdominal or pelvic fluid, inflammatory change or pathologic adenopathy is seen  IMPRESSION: Infrarenal abdominal aortic aneurysm as described above.  Bilateral renal cysts with two hyperattenuating lesions in the left kidney.  These may represent hyperattenuating cysts but are considered indeterminate.  8 mm right middle lobe pulmonary nodule. In a low risk patient this can be reassessed in 6 months.  As the patient has chronic renal failure precluding contrast administration, initial short-term reassessment of the hyperattenuating foci in the left kidney can initially be performed simultaneously with pulmonary nodule assessment in 6 months.  Fat containing right inguinal hernia.   Original Report Authenticated By: Rhodia Albright, M.D.    Dg Chest 2 View  10/14/2012  *RADIOLOGY REPORT*  Clinical Data: Preop, hypertension.  CHEST - 2 VIEW  Comparison: None.  Findings: Mild hyperinflation of the lungs. Heart and mediastinal contours are within normal limits.  No focal opacities or effusions.  No acute bony  abnormality.  IMPRESSION: Mild hyperinflation.  No acute findings.   Original Report Authenticated By: Charlett Nose, M.D.    US Renal  10/06/2012  **ADDENDUM** CREATED: 10/06/2012 15:40:46  I discussed the findings of this study with Dr. Tanya Nones at 3:42 p.m. on 10/06/2012  **END ADDENDUM** SIGNED BY:  Colon Flattery. Gery Pray, M.D.   10/06/2012  *RADIOLOGY REPORT*  Clinical Data: Chronic kidney disease  RENAL/URINARY TRACT ULTRASOUND COMPLETE  Comparison:  None.  Findings:  Right Kidney:  No hydronephrosis is noted.  The right kidney measures 10.4 cm sagittally.  The parenchyma of the right kidney is somewhat echogenic.  Multiple cysts are present, the largest measuring 7.1 x 5.9 x 6.2 cm in the lower pole.  Left Kidney:  No hydronephrosis is noted.  The left kidney measures 10.5 cm, and there are multiple cysts again noted.  The largest measures 3.5 x 2.9 x 3.5 cm in the mid left kidney.  Bladder:  The urinary bladder is unremarkable.  The prostate is within normal limits in size.  However, incidental note is made of a large abdominal aortic aneurysm measuring 7.1  x 6.8 cm with considerable intraluminal clot. Vascular surgery consultation recommended due to increased risk of rupture for AAA >5.5 cm. This recommendation follows ACR consensus.  IMPRESSION:  1.  No hydronephrosis.  Multiple renal cysts. 2.  The echogenicity of the right renal parenchyma is increased. 3.  Incidental note of a large distal abdominal aortic aneurysm of 7.1 x  6.8 cm with intraluminal clot.  This report will be called by the ultrasound technologist to referring physician.   Original Report Authenticated By: Dwyane Dee, M.D.    Dg Chest Portable 1 View  10/16/2012  *RADIOLOGY REPORT*  Clinical Data: History of abdominal aortic aneurysm repair. History of Swan-Ganz catheter in place with left basilar atelectasis.  PORTABLE CHEST - 1 VIEW  Comparison: 10/15/2012. 10/14/2012.  Findings: Cardiac silhouette is upper limits of normal size. Ectasia and nonaneurysmal calcification of the thoracic aorta are seen.  Mediastinal and hilar contours appear stable.  Tip of enteric tube terminates in the area of the body of the stomach. Tip of right internal jugular Swan-Ganz catheter terminates in left pulmonary artery.  There is elevation of the margin of  the right hemidiaphragm with minimal right basilar atelectasis.  Atelectasis and patchy infiltrative density is seen in the left medial base. Smaller nodularity projecting in left lateral base could reflect a nipple density.  No nodular area was seen in the examination with larger respiratory result of 10/14/2012. No pleural abnormality is evident.  There is slight scoliosis and minimal degenerative spondylosis.  IMPRESSION: Enteric tube and Swan-Ganz catheter remain in place.  No pneumothorax is evident.  Minimal right basilar atelectasis. Atelectasis and patchy infiltrative density in left base without consolidation or pleural effusion.   Original Report Authenticated By: Onalee Hua Call    Dg Chest Portable 1 View  10/15/2012  *RADIOLOGY REPORT*  Clinical Data: Line placement.  PORTABLE CHEST - 1 VIEW  Comparison: 10/14/2012  Findings: Swan-Ganz catheter is in the central right pulmonary artery.  NG tube enters the stomach.  No pneumothorax.  Minimal left base atelectasis.  No effusions.  Heart is normal size.  IMPRESSION: Right Swan-Ganz catheter tip in the central right pulmonary artery. No pneumothorax.  Left base atelectasis.   Original Report Authenticated By: Charlett Nose, M.D.    Dg Abd Portable 1v  10/15/2012  *RADIOLOGY REPORT*  Clinical Data: Postop AAA.  PORTABLE ABDOMEN - 1 VIEW  Comparison: CT 10/10/2012  Findings: Midline skin staples noted.  NG tube is in the proximal stomach.  Nonobstructive bowel gas pattern.  No supine evidence of free air.  No acute bony abnormality.  No organomegaly or suspicious calcification.  IMPRESSION: Postoperative changes.   Original Report Authenticated By: Charlett Nose, M.D.    Anti-infectives: Anti-infectives     Start     Dose/Rate Route Frequency Ordered Stop   10/15/12 2030   cefUROXime (ZINACEF) 1.5 g in dextrose 5 % 50 mL IVPB        1.5 g 100 mL/hr over 30 Minutes Intravenous Every 12 hours 10/15/12 1638 10/16/12 0906   10/15/12 0600   cefUROXime  (ZINACEF) 1.5 g in dextrose 5 % 50 mL IVPB        1.5 g 100 mL/hr over 30 Minutes Intravenous 30 min pre-op 10/14/12 1437 10/15/12 0830          Assessment/Plan: s/p Procedure(s) (LRB) with comments: ANEURYSM ABDOMINAL AORTIC REPAIR (N/A) - Resection and Grafting of Abdominal Aortic Aneurysm - Aortobi-Iliac  Continue to mobilize. Clear liquid diet advance as tolerated   LOS: 3 days   Mark [name] 10/18/2012, 12:17 PM

## 2012-10-19 LAB — CBC
Hemoglobin: 9.2 g/dL — ABNORMAL LOW (ref 13.0–17.0)
MCH: 31.5 pg (ref 26.0–34.0)
Platelets: 110 10*3/uL — ABNORMAL LOW (ref 150–400)
RBC: 2.92 MIL/uL — ABNORMAL LOW (ref 4.22–5.81)
WBC: 7.3 10*3/uL (ref 4.0–10.5)

## 2012-10-19 LAB — TYPE AND SCREEN: Antibody Screen: NEGATIVE

## 2012-10-19 LAB — BASIC METABOLIC PANEL
CO2: 24 mEq/L (ref 19–32)
Calcium: 8.7 mg/dL (ref 8.4–10.5)
Glucose, Bld: 106 mg/dL — ABNORMAL HIGH (ref 70–99)
Potassium: 4.2 mEq/L (ref 3.5–5.1)
Sodium: 140 mEq/L (ref 135–145)

## 2012-10-19 MED ORDER — OXYCODONE-ACETAMINOPHEN 5-325 MG PO TABS: 1.0000 | ORAL_TABLET | ORAL | Status: DC | PRN

## 2012-10-19 NOTE — Progress Notes (Signed)
Patient ID: Mark [name], male   DOB: 01-May-1946, 67 y.o.   MRN: [id number] The patient did well following open aneurysm surgery postop day 4. He has no nausea or vomiting. He is eating regular diet although he is not very hungry. He has had a bowel movement and is passing gas. He has been up walking without difficulty  Abdominal wound is well healed with no evidence of breakdown. He has a normal femoral and popliteal pulses.  Postop day 4 open aneurysm repair. We'll plan to DC his oxygen therapy and discharge him today if his saturations are greater than 90%

## 2012-10-22 NOTE — Discharge Summary (Signed)
[facility] Discharge Summary   Patient ID:  Mark [name] MRN: [id number] DOB/AGE: December 10, 1945 67 y.o.  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 GERD (gastroesophageal reflux disease)   . Hyperlipidemia   . Chronic kidney disease   . Atrial fibrillation     stress test 10/09/12  . H/O hiatal hernia   . Arthritis   . Hypertension     sees Dr. Lynnea Ferrier    Procedure(s): ANEURYSM ABDOMINAL AORTIC REPAIR  Discharged Condition: good  HPI:  Mark [name] is a 67 y.o. male returns to discuss further treatment of his large abdominal aortic aneurysm. The Cardiolite performed which was excellent with a good ejection fraction and no evidence of ischemia. CT scan was performed yesterday which are reviewed by computer. He has a 7.5 cm infrarenal abdominal aortic aneurysm which extends up to the renal arteries-he is not a candidate for aortic stent grafting. He continues to have left buttock claudication symptoms in her lower extremity arterial Dopplers performed today. He was admitted for open repair of AAA.    Hospital Course:  Mark [name] is a 67 y.o. male is S/P  Resection and grafting of abdominal aortic aneurysm with insertion of an aorto by common iliac graft using 18 x 9 mm Hemashield Dacron graft ANEURYSM ABDOMINAL AORTIC REPAIR Extubated: POD # 0 Post-op wounds healing well Abdomen soft Non tender DP pulses were palpable bilat Pt. Ambulating, voiding and taking PO diet without difficulty. Pt pain controlled with PO pain meds. Labs as below Complications:none  Consults:     Significant Diagnostic Studies: CBC Lab Results   Component Value Date   WBC 7.3 10/19/2012   HGB 9.2* 10/19/2012   HCT 27.8* 10/19/2012   MCV 95.2 10/19/2012   PLT 110* 10/19/2012    BMET    Component Value Date/Time   NA 140 10/19/2012 0610   K 4.2 10/19/2012 0610   CL 109 10/19/2012 0610   CO2 24 10/19/2012 0610   GLUCOSE 106* 10/19/2012 0610   BUN 25* 10/19/2012 0610   CREATININE 2.34* 10/19/2012 0610   CALCIUM 8.7 10/19/2012 0610   GFRNONAA 27* 10/19/2012 0610   GFRAA 31* 10/19/2012 0610   COAG Lab Results  Component Value Date   INR 1.21 10/15/2012   INR 1.00 10/14/2012     Disposition:  Discharge to :Home Discharge Orders    Future Appointments: Provider: Department: Dept Phone: Center:   10/28/2012 9:15 AM Pryor Ochoa, MD [facility] [phone] [facility]     Future Orders Please Complete By Expires   Resume previous diet      Driving Restrictions      Comments:   No driving for 2weeks   Call MD for:  temperature >100.5      Call MD for:  redness, tenderness, or signs of infection (pain, swelling, bleeding, redness, odor or green/yellow discharge around incision site)      Call MD for:  severe or increased pain, loss or decreased feeling  in affected limb(s)      ABDOMINAL PROCEDURE/ANEURYSM REPAIR/AORTO-BIFEMORAL BYPASS:  Call MD for increased abdominal pain; cramping diarrhea; nausea/vomiting         [place], Daisey T  Home Medication  Instructions ONG:[id number]   Printed on:10/22/12 1341  Medication Information                    aspirin 81 MG tablet Take 81 mg by mouth every morning.            amLODipine (NORVASC) 10 MG tablet Take 10 mg by mouth at bedtime.            losartan (COZAAR) 100 MG tablet Take 100 mg by mouth every morning.            atorvastatin (LIPITOR) 40 MG tablet Take 40 mg by mouth at bedtime.            omeprazole (PRILOSEC) 40 MG capsule Take 40 mg by mouth every morning.            oxyCODONE-acetaminophen (PERCOCET/ROXICET) 5-325 MG per tablet Take 1-2 tablets by  mouth every 4 (four) hours as needed.            Verbal and written Discharge instructions given to the patient. Wound care per Discharge AVS   Signed: Marlowe Shores 10/22/2012, 1:41 PM

## 2012-10-23 ENCOUNTER — Telehealth: Payer: Self-pay

## 2012-10-23 NOTE — Telephone Encounter (Signed)
Granddaughter called to report swelling in both legs with (L) > (R).  Pt. states the swelling has worsened since he was discharged from the hospital, yesterday.  Denies any SOB.  States has been elevating legs in a recliner.  Encouraged to have pt. Elevated legs above the level of the heart.  Daughter also reported that pt. Has not had BM x 5 days.  Denies abd. distension, nausea/vomiting, or abdominal pain.  States is passing gas.  Advised to have pt. maintain mobility with walking short intervals in house sev. times / day, maintain adequate fluid intake, drink prune juice or apple juice.  Also advised if no results with previous recommendations, to take MOM 15-30 cc.  Advised to call office if no relief of or worsening  symptoms. Daughter verb. understanding.

## 2012-10-27 ENCOUNTER — Encounter: Payer: Self-pay | Admitting: Vascular Surgery

## 2012-10-28 ENCOUNTER — Encounter: Payer: Self-pay | Admitting: Vascular Surgery

## 2012-10-28 ENCOUNTER — Ambulatory Visit (INDEPENDENT_AMBULATORY_CARE_PROVIDER_SITE_OTHER): Payer: Medicare Other | Admitting: Vascular Surgery

## 2012-10-28 VITALS — BP 152/88 | HR 99 | Resp 20 | Ht 66.5 in | Wt 159.0 lb

## 2012-10-28 DIAGNOSIS — I714 Abdominal aortic aneurysm, without rupture: Secondary | ICD-10-CM

## 2012-10-28 NOTE — Progress Notes (Signed)
Subjective:     Patient ID: Mark [name], male   DOB: 01/30/1946, 67 y.o.   MRN: [id number]  HPIthis 67 year old male returns for initial followup regarding his recent open resection and grafting of abdominal aortic aneurysm with insertion aortobilateral, and iliac graft performed 2 weeks ago. He has done very well since his discharge. He was discharged on the fourth postoperative day. His appetite is improving. He initially had some constipation but that seems to be resolved with milk of magnesia. Beginning to increase his activity level and is taking very little pain medication.   Review of Systems     Objective:   Physical Exam BP 152/88  Pulse 99  Resp 20  Ht 5' 6.5" (1.689 m)  Wt 159 lb (72.122 kg)  BMI 25.28 kg/m2  General well-developed well-nourished male in no apparent stress alert and oriented x3 Abdomen midline incision healing nicely no evidence of ventral hernia or infection 3+ femoral 2+ posterior tibial pulses palpable bilaterally.      Assessment:     Doing well 2 weeks post resection and grafting of abdominal aortic aneurysm insertion aorta biiliac graft     Plan:     Return in 2 months for continued followup Okay to begin driving automobile if he feels up to it

## 2012-12-29 ENCOUNTER — Encounter: Payer: Self-pay | Admitting: Vascular Surgery

## 2012-12-30 ENCOUNTER — Encounter: Payer: Self-pay | Admitting: Vascular Surgery

## 2012-12-30 ENCOUNTER — Ambulatory Visit (INDEPENDENT_AMBULATORY_CARE_PROVIDER_SITE_OTHER): Payer: Medicare Other | Admitting: Vascular Surgery

## 2012-12-30 VITALS — BP 160/91 | HR 102 | Ht 66.5 in | Wt 150.0 lb

## 2012-12-30 DIAGNOSIS — I714 Abdominal aortic aneurysm, without rupture, unspecified: Secondary | ICD-10-CM

## 2012-12-30 DIAGNOSIS — Z48812 Encounter for surgical aftercare following surgery on the circulatory system: Secondary | ICD-10-CM

## 2012-12-30 NOTE — Progress Notes (Signed)
Subjective:     Patient ID: Mark [name], male   DOB: [birth year], 67 y.o.   MRN: [id number]  HPI D.-year-old male returns to half months post resection and grafting of abdominal aortic aneurysm with insertion aortobiiliac graft. He also had iliac occlusive disease. His claudication symptoms have been completely relieved. His appetite is returning towards normal. Bowel movements are regular.   Review of Systems     Objective:   Physical Exam BP 160/91  Pulse 102  Ht 5' 6.5" (1.689 m)  Wt 150 lb (68.04 kg)  BMI 23.85 kg/m2  SpO2 98%  General well-developed well-nourished male in no apparent stress alert and oriented x3 Abdominal incision well-healed no evidence hernia 3+ femoral dorsalis pedis and posterior tibial pulses bilaterally      Assessment:     Doing well 3 months post abdominal aortic aneurysm resection with aortobiiliac graft Claudication symptoms and hips relieved    Plan:     Return to see [place] in one year with ABIs for final check

## 2012-12-30 NOTE — Addendum Note (Signed)
Addended by: Adria Dill L on: 12/30/2012 10:48 AM   Modules accepted: Orders

## 2013-01-07 ENCOUNTER — Encounter: Payer: Self-pay | Admitting: Family Medicine

## 2013-01-07 ENCOUNTER — Ambulatory Visit (INDEPENDENT_AMBULATORY_CARE_PROVIDER_SITE_OTHER): Payer: Medicare Other | Admitting: Family Medicine

## 2013-01-07 VITALS — BP 160/90 | HR 82 | Temp 98.1°F | Resp 18 | Wt 150.0 lb

## 2013-01-07 DIAGNOSIS — I10 Essential (primary) hypertension: Secondary | ICD-10-CM | POA: Insufficient documentation

## 2013-01-07 DIAGNOSIS — E785 Hyperlipidemia, unspecified: Secondary | ICD-10-CM

## 2013-01-07 DIAGNOSIS — F17200 Nicotine dependence, unspecified, uncomplicated: Secondary | ICD-10-CM

## 2013-01-07 DIAGNOSIS — Z125 Encounter for screening for malignant neoplasm of prostate: Secondary | ICD-10-CM

## 2013-01-07 LAB — HEPATIC FUNCTION PANEL
ALT: <8 U/L (ref 0–53)
AST: 10 U/L (ref 0–37)
Albumin: 3.9 g/dL (ref 3.5–5.2)
Alkaline Phosphatase: 86 U/L (ref 39–117)
Bilirubin, Direct: 0.1 mg/dL (ref 0.0–0.3)
Total Bilirubin: 0.4 mg/dL (ref 0.3–1.2)

## 2013-01-07 LAB — BASIC METABOLIC PANEL
BUN: 26 mg/dL — ABNORMAL HIGH (ref 6–23)
Chloride: 105 mEq/L (ref 96–112)
Glucose, Bld: 101 mg/dL — ABNORMAL HIGH (ref 70–99)
Potassium: 5 mEq/L (ref 3.5–5.3)
Sodium: 141 mEq/L (ref 135–145)

## 2013-01-07 NOTE — Progress Notes (Signed)
Subjective:    Patient ID: Mark [name], male    DOB: 1946-08-18, 67 y.o.   MRN: [id number]  HPI  Patient is here for followup for his medical problems.  He recently underwent repair of a AAA by Dr. Hart Rochester.  He has done exceptionally well after the surgery. He has lost substantial weight. He does report some fatigue and dyspnea on exertion. This is in keeping with the anticipated postoperative course. He is here today for followup of his blood pressure. His current taking Norvasc 10 mg by mouth daily and Cozaar 100 mg by mouth daily. He denies any chest pain. His blood pressure has been consistently elevated at 160/90.  Furthermore he is overdue for his prostate cancer screening.  He is still smoking. However he is now very interested in quitting. He has switched to electronic cigarettes.  Past Medical History  Diagnosis Date  . GERD (gastroesophageal reflux disease)   . Hyperlipidemia   . Chronic kidney disease   . Atrial fibrillation     stress test 10/09/12  . H/O hiatal hernia   . Arthritis   . Hypertension     sees Dr. Lynnea Ferrier   Current Outpatient Prescriptions on File Prior to Visit  Medication Sig Dispense Refill  . amLODipine (NORVASC) 10 MG tablet Take 10 mg by mouth at bedtime.       Marland Kitchen aspirin 81 MG tablet Take 81 mg by mouth every morning.       Marland Kitchen atorvastatin (LIPITOR) 40 MG tablet Take 40 mg by mouth at bedtime.       Marland Kitchen losartan (COZAAR) 100 MG tablet Take 100 mg by mouth every morning.       Marland Kitchen omeprazole (PRILOSEC) 40 MG capsule Take 40 mg by mouth every morning.       Marland Kitchen oxyCODONE-acetaminophen (PERCOCET/ROXICET) 5-325 MG per tablet Take 1-2 tablets by mouth every 4 (four) hours as needed.  30 tablet  0   No current facility-administered medications on file prior to visit.   History   Social History  . Marital Status: Married    Spouse Name: N/A    Number of Children: N/A  . Years of Education: N/A   Occupational History  . Not on file.   Social  History Main Topics  . Smoking status: Current Every Day Smoker -- 0.30 packs/day for 50 years    Types: Cigarettes  . Smokeless tobacco: Never Used     Comment: pt states that he does not smoke over 5 cigs per day  . Alcohol Use: No  . Drug Use: No  . Sexually Active: Not on file   Other Topics Concern  . Not on file   Social History Narrative  . No narrative on file     Review of Systems  All other systems reviewed and are negative.       Objective:   Physical Exam  Constitutional: He appears well-developed and well-nourished.  HENT:  Head: Normocephalic.  Right Ear: External ear normal.  Left Ear: External ear normal.  Eyes: Conjunctivae and EOM are normal. Pupils are equal, round, and reactive to light.  Neck: Neck supple. No JVD present. No thyromegaly present.  Cardiovascular: Normal rate, regular rhythm, normal heart sounds and intact distal pulses.   No murmur heard. Pulmonary/Chest: Effort normal and breath sounds normal. No respiratory distress. He has no wheezes. He has no rales. He exhibits no tenderness.  Abdominal: Soft. Bowel sounds are normal. He exhibits no distension. There is  no tenderness. There is no rebound and no guarding.  Lymphadenopathy:    He has no cervical adenopathy.   there is no peripheral edema.        Assessment & Plan:  1. HTN (hypertension) Diastolic 5 mg by mouth daily. Recheck his blood pressure in one month - Basic Metabolic Panel - Hepatic Function Panel  2. CKD (chronic kidney disease), stage III Recheck his renal function. Again emphasized the need to stay away from NSAIDs. - Basic Metabolic Panel - Hepatic Function Panel  3. Smoking Spent 10 minutes discussing the risk of smoking. Also discussed the risk of electronic cigarettes. Discussed the risk of nicotine in the electronic cigarettes. Encouraged him to continue to try to quit smoking. I recommended using the electronic cigarettes as a means to an end. However I  like him to discontinue these if possible due to the risk of the nicotine.  4. Prostate cancer screening Declines rectal exam today, we'll check a PSA - PSA, Medicare

## 2013-01-28 ENCOUNTER — Other Ambulatory Visit: Payer: Self-pay | Admitting: Family Medicine

## 2013-01-28 NOTE — Telephone Encounter (Signed)
rx refilled.

## 2013-01-28 NOTE — Telephone Encounter (Signed)
Script refill

## 2013-02-04 ENCOUNTER — Telehealth: Payer: Self-pay | Admitting: Family Medicine

## 2013-02-05 NOTE — Telephone Encounter (Signed)
Yes continue bystolic 5 mg poqday.

## 2013-02-06 MED ORDER — NEBIVOLOL HCL 5 MG PO TABS: 5.0000 mg | ORAL_TABLET | Freq: Every day | ORAL | Status: DC

## 2013-02-06 NOTE — Telephone Encounter (Signed)
Rx Refilled  

## 2013-02-10 ENCOUNTER — Telehealth: Payer: Self-pay | Admitting: Family Medicine

## 2013-02-10 NOTE — Telephone Encounter (Signed)
He could switch to atenolol 50 poqday.

## 2013-02-11 MED ORDER — ATENOLOL 50 MG PO TABS: 50.0000 mg | ORAL_TABLET | Freq: Every day | ORAL | Status: DC

## 2013-02-11 NOTE — Telephone Encounter (Signed)
..  Rx Filled and Patient aware

## 2013-02-25 ENCOUNTER — Telehealth: Payer: Self-pay | Admitting: Family Medicine

## 2013-02-25 ENCOUNTER — Other Ambulatory Visit: Payer: Self-pay | Admitting: Family Medicine

## 2013-02-25 MED ORDER — ATORVASTATIN CALCIUM 40 MG PO TABS: 40.0000 mg | ORAL_TABLET | Freq: Every day | ORAL | Status: DC

## 2013-02-25 NOTE — Telephone Encounter (Signed)
Rx Refilled  

## 2013-04-13 ENCOUNTER — Ambulatory Visit (INDEPENDENT_AMBULATORY_CARE_PROVIDER_SITE_OTHER): Payer: Medicare Other | Admitting: Family Medicine

## 2013-04-13 ENCOUNTER — Encounter: Payer: Self-pay | Admitting: Family Medicine

## 2013-04-13 VITALS — BP 136/78 | HR 60 | Temp 98.6°F | Resp 16 | Wt 146.0 lb

## 2013-04-13 DIAGNOSIS — E785 Hyperlipidemia, unspecified: Secondary | ICD-10-CM

## 2013-04-13 DIAGNOSIS — N183 Chronic kidney disease, stage 3 unspecified: Secondary | ICD-10-CM

## 2013-04-13 DIAGNOSIS — I10 Essential (primary) hypertension: Secondary | ICD-10-CM

## 2013-04-13 LAB — COMPLETE METABOLIC PANEL WITH GFR
BUN: 28 mg/dL — ABNORMAL HIGH (ref 6–23)
CO2: 24 mEq/L (ref 19–32)
Calcium: 9.3 mg/dL (ref 8.4–10.5)
Chloride: 106 mEq/L (ref 96–112)
Creat: 2.28 mg/dL — ABNORMAL HIGH (ref 0.50–1.35)
GFR, Est African American: 33 mL/min — ABNORMAL LOW
GFR, Est Non African American: 28 mL/min — ABNORMAL LOW
Total Bilirubin: 0.4 mg/dL (ref 0.3–1.2)

## 2013-04-13 LAB — LIPID PANEL
Cholesterol: 120 mg/dL (ref 0–200)
HDL: 35 mg/dL — ABNORMAL LOW (ref >39)
Total CHOL/HDL Ratio: 3.4 Ratio
Triglycerides: 118 mg/dL (ref <150)

## 2013-04-13 LAB — CBC WITH DIFFERENTIAL/PLATELET
Eosinophils Absolute: 0.2 10*3/uL (ref 0.0–0.7)
Hemoglobin: 12.5 g/dL — ABNORMAL LOW (ref 13.0–17.0)
Lymphocytes Relative: 44 % (ref 12–46)
Lymphs Abs: 2.6 10*3/uL (ref 0.7–4.0)
Neutro Abs: 2.7 10*3/uL (ref 1.7–7.7)
Neutrophils Relative %: 44 % (ref 43–77)
Platelets: 104 10*3/uL — ABNORMAL LOW (ref 150–400)
RBC: 4.13 MIL/uL — ABNORMAL LOW (ref 4.22–5.81)
WBC: 5.9 10*3/uL (ref 4.0–10.5)

## 2013-04-13 NOTE — Progress Notes (Signed)
Subjective:    Patient ID: Mark [name], male    DOB: February 26, 1946, 67 y.o.   MRN: [id number]  HPI Patient is here for followup of his chronic kidney disease, hypertension, and hyperlipidemia. His blood pressure is currently well controlled at 136/78. He denies any chest pain, shortness of breath, or dyspnea on exertion. He continues to report weight loss. His weight is down 13 pounds since January and 5 pounds since his office visit in April. He states he eats regularly. He denies any chest pain hemoptysis or chronic cough. He does continue to smoke. He denies any melena or hematochezia. He denies any palpitations, diarrhea, or thinning hair. He denies any depression. Past Medical History  Diagnosis Date  . GERD (gastroesophageal reflux disease)   . Hyperlipidemia   . Atrial fibrillation     stress test 10/09/12  . H/O hiatal hernia   . Arthritis   . Hypertension     sees Dr. Lynnea Ferrier  . Chronic kidney disease    Past Surgical History  Procedure Laterality Date  . Knee surgery      right  . Abdominal aortic aneurysm repair  10/15/2012    Procedure: ANEURYSM ABDOMINAL AORTIC REPAIR;  Surgeon: Pryor Ochoa, MD;  Location: [facility] OR;  Service: Vascular;  Laterality: N/A;  Resection and Grafting of Abdominal Aortic Aneurysm - Aortobi-Iliac   . Abdominal aortic aneurysm repair     Current Outpatient Prescriptions on File Prior to Visit  Medication Sig Dispense Refill  . amLODipine (NORVASC) 10 MG tablet Take 10 mg by mouth at bedtime.       Marland Kitchen aspirin 81 MG tablet Take 81 mg by mouth every morning.       Marland Kitchen atenolol (TENORMIN) 50 MG tablet Take 1 tablet (50 mg total) by mouth daily.  30 tablet  5  . atorvastatin (LIPITOR) 40 MG tablet TAKE 1 TABLET AT BEDTIME.STOP TAKING SIMVASTATIN  30 tablet  3  . losartan (COZAAR) 100 MG tablet TAKE 1 TABLET EVERY DAY (DOSAGE INCREASED)  30 tablet  3  . omeprazole (PRILOSEC) 40 MG capsule Take 40 mg by mouth every morning.        No current  facility-administered medications on file prior to visit.   Allergies  Allergen Reactions  . Niaspan (Niacin Er) Anaphylaxis   History   Social History  . Marital Status: Married    Spouse Name: N/A    Number of Children: N/A  . Years of Education: N/A   Occupational History  . Not on file.   Social History Main Topics  . Smoking status: Current Every Day Smoker -- 0.30 packs/day for 50 years    Types: Cigarettes  . Smokeless tobacco: Never Used     Comment: pt states that he does not smoke over 5 cigs per day  . Alcohol Use: No  . Drug Use: No  . Sexually Active: Not on file   Other Topics Concern  . Not on file   Social History Narrative  . No narrative on file      Review of Systems  All other systems reviewed and are negative.       Objective:   Physical Exam  Vitals reviewed. Constitutional: He appears well-developed and well-nourished.  Neck: Neck supple. No JVD present. No thyromegaly present.  Cardiovascular: Normal rate, regular rhythm, normal heart sounds and intact distal pulses.  Exam reveals no gallop and no friction rub.   No murmur heard. Pulmonary/Chest: Effort normal and breath  sounds normal. No respiratory distress. He has no wheezes. He has no rales. He exhibits no tenderness.  Abdominal: Soft. Bowel sounds are normal. He exhibits no distension. There is no tenderness. There is no rebound and no guarding.  Musculoskeletal: Normal range of motion. He exhibits no edema.  Lymphadenopathy:    He has no cervical adenopathy.  Skin: No rash noted. No erythema. No pallor.          Assessment & Plan:  1. HTN (hypertension) Blood pressure today is well controlled. Continue present medications at their current dosages. Given his history of peripheral vascular disease, I recommended smoking cessation and for the patient to continue aspirin 81 mg by mouth daily - COMPLETE METABOLIC PANEL WITH GFR  2. CKD (chronic kidney disease), stage  III Recheck the patient's creatinine today. If the patient's creatinine is stable and his CBC shows no sign of anemia, then space the patient's evaluation out to every 6 months - COMPLETE METABOLIC PANEL WITH GFR - CBC with Differential  3. HLD (hyperlipidemia) Patient's goal LDL is less than 70. Check fasting lipid panel. - Lipid panel  We discussed his weight loss at length. I recommended a TSH, chest x-ray, and possible GI evaluation if weight loss persists. He would like to check her weight every day and notify me if it's getting worse. If weight loss continues get worse he agrees to proceed with this workup. He will call me if his weight continues to drop.

## 2013-04-25 ENCOUNTER — Other Ambulatory Visit: Payer: Self-pay | Admitting: Family Medicine

## 2013-07-03 ENCOUNTER — Ambulatory Visit (INDEPENDENT_AMBULATORY_CARE_PROVIDER_SITE_OTHER): Payer: Medicare Other | Admitting: Family Medicine

## 2013-07-03 DIAGNOSIS — Z23 Encounter for immunization: Secondary | ICD-10-CM

## 2013-08-06 ENCOUNTER — Other Ambulatory Visit: Payer: Self-pay | Admitting: Family Medicine

## 2013-08-11 ENCOUNTER — Ambulatory Visit (INDEPENDENT_AMBULATORY_CARE_PROVIDER_SITE_OTHER): Payer: Medicare Other | Admitting: Family Medicine

## 2013-08-11 ENCOUNTER — Encounter (HOSPITAL_COMMUNITY): Payer: Self-pay | Admitting: Emergency Medicine

## 2013-08-11 ENCOUNTER — Inpatient Hospital Stay (HOSPITAL_COMMUNITY)
Admission: EM | Admit: 2013-08-11 | Discharge: 2013-08-17 | DRG: 193 | Disposition: A | Discharge status: Home / Self Care | Payer: Medicare Other | Attending: Internal Medicine | Admitting: Internal Medicine

## 2013-08-11 ENCOUNTER — Emergency Department (HOSPITAL_COMMUNITY): Payer: Medicare Other

## 2013-08-11 ENCOUNTER — Encounter: Payer: Self-pay | Admitting: Family Medicine

## 2013-08-11 VITALS — BP 180/88 | HR 88 | Temp 99.2°F | Resp 24 | Ht 66.5 in | Wt 146.0 lb

## 2013-08-11 DIAGNOSIS — Z79899 Other long term (current) drug therapy: Secondary | ICD-10-CM

## 2013-08-11 DIAGNOSIS — I714 Abdominal aortic aneurysm, without rupture, unspecified: Secondary | ICD-10-CM | POA: Diagnosis present

## 2013-08-11 DIAGNOSIS — J189 Pneumonia, unspecified organism: Secondary | ICD-10-CM | POA: Diagnosis present

## 2013-08-11 DIAGNOSIS — R0600 Dyspnea, unspecified: Secondary | ICD-10-CM

## 2013-08-11 DIAGNOSIS — N189 Chronic kidney disease, unspecified: Secondary | ICD-10-CM

## 2013-08-11 DIAGNOSIS — E785 Hyperlipidemia, unspecified: Secondary | ICD-10-CM | POA: Diagnosis present

## 2013-08-11 DIAGNOSIS — I129 Hypertensive chronic kidney disease with stage 1 through stage 4 chronic kidney disease, or unspecified chronic kidney disease: Secondary | ICD-10-CM | POA: Diagnosis present

## 2013-08-11 DIAGNOSIS — K219 Gastro-esophageal reflux disease without esophagitis: Secondary | ICD-10-CM | POA: Diagnosis present

## 2013-08-11 DIAGNOSIS — F17200 Nicotine dependence, unspecified, uncomplicated: Secondary | ICD-10-CM | POA: Diagnosis present

## 2013-08-11 DIAGNOSIS — J9600 Acute respiratory failure, unspecified whether with hypoxia or hypercapnia: Secondary | ICD-10-CM | POA: Diagnosis present

## 2013-08-11 DIAGNOSIS — I4891 Unspecified atrial fibrillation: Secondary | ICD-10-CM

## 2013-08-11 DIAGNOSIS — R0989 Other specified symptoms and signs involving the circulatory and respiratory systems: Secondary | ICD-10-CM

## 2013-08-11 DIAGNOSIS — R0609 Other forms of dyspnea: Secondary | ICD-10-CM

## 2013-08-11 DIAGNOSIS — J9601 Acute respiratory failure with hypoxia: Secondary | ICD-10-CM

## 2013-08-11 DIAGNOSIS — D649 Anemia, unspecified: Secondary | ICD-10-CM | POA: Diagnosis not present

## 2013-08-11 DIAGNOSIS — E875 Hyperkalemia: Secondary | ICD-10-CM | POA: Diagnosis present

## 2013-08-11 DIAGNOSIS — J441 Chronic obstructive pulmonary disease with (acute) exacerbation: Secondary | ICD-10-CM

## 2013-08-11 DIAGNOSIS — N183 Chronic kidney disease, stage 3 unspecified: Secondary | ICD-10-CM | POA: Diagnosis present

## 2013-08-11 DIAGNOSIS — Z720 Tobacco use: Secondary | ICD-10-CM | POA: Diagnosis present

## 2013-08-11 DIAGNOSIS — I10 Essential (primary) hypertension: Secondary | ICD-10-CM | POA: Diagnosis present

## 2013-08-11 LAB — CBC WITH DIFFERENTIAL/PLATELET
Basophils Absolute: 0 10*3/uL (ref 0.0–0.1)
Eosinophils Relative: 0 % (ref 0–5)
HCT: 40.4 % (ref 39.0–52.0)
Lymphocytes Relative: 7 % — ABNORMAL LOW (ref 12–46)
MCH: 31.1 pg (ref 26.0–34.0)
MCV: 94.4 fL (ref 78.0–100.0)
Monocytes Absolute: 0.4 10*3/uL (ref 0.1–1.0)
RDW: 13.2 % (ref 11.5–15.5)
WBC: 7.8 10*3/uL (ref 4.0–10.5)

## 2013-08-11 LAB — BASIC METABOLIC PANEL
CO2: 19 mEq/L (ref 19–32)
Calcium: 9.2 mg/dL (ref 8.4–10.5)
Creatinine, Ser: 2.46 mg/dL — ABNORMAL HIGH (ref 0.50–1.35)
Glucose, Bld: 184 mg/dL — ABNORMAL HIGH (ref 70–99)

## 2013-08-11 MED ORDER — LOSARTAN POTASSIUM 50 MG PO TABS
100.0000 mg | ORAL_TABLET | Freq: Every day | ORAL | Status: DC
  Administered 2013-08-12 – 2013-08-14 (×3): 100 mg via ORAL
  Filled 2013-08-11 (×4): qty 2

## 2013-08-11 MED ORDER — DOCUSATE SODIUM 100 MG PO CAPS
100.0000 mg | ORAL_CAPSULE | Freq: Two times a day (BID) | ORAL | Status: DC
  Administered 2013-08-14 – 2013-08-15 (×4): 100 mg via ORAL
  Filled 2013-08-11 (×12): qty 1

## 2013-08-11 MED ORDER — SODIUM CHLORIDE 0.9 % IV SOLN
INTRAVENOUS | Status: DC
  Administered 2013-08-11 – 2013-08-12 (×2): via INTRAVENOUS

## 2013-08-11 MED ORDER — DEXTROSE 5 % IV SOLN
1.0000 g | INTRAVENOUS | Status: DC
  Administered 2013-08-13 – 2013-08-16 (×5): 1 g via INTRAVENOUS
  Filled 2013-08-11 (×6): qty 10

## 2013-08-11 MED ORDER — ALBUTEROL SULFATE (5 MG/ML) 0.5% IN NEBU: 2.5000 mg | INHALATION_SOLUTION | RESPIRATORY_TRACT | Status: DC | PRN

## 2013-08-11 MED ORDER — ONDANSETRON HCL 4 MG/2ML IJ SOLN: 4.0000 mg | Freq: Four times a day (QID) | INTRAMUSCULAR | Status: DC | PRN

## 2013-08-11 MED ORDER — DEXTROSE 5 % IV SOLN
500.0000 mg | INTRAVENOUS | Status: DC
  Administered 2013-08-12 – 2013-08-16 (×5): 500 mg via INTRAVENOUS
  Filled 2013-08-11 (×6): qty 500

## 2013-08-11 MED ORDER — HEPARIN SODIUM (PORCINE) 5000 UNIT/ML IJ SOLN
5000.0000 [IU] | Freq: Three times a day (TID) | INTRAMUSCULAR | Status: DC
  Administered 2013-08-11 – 2013-08-13 (×5): 5000 [IU] via SUBCUTANEOUS
  Filled 2013-08-11 (×6): qty 1

## 2013-08-11 MED ORDER — ALBUTEROL SULFATE (5 MG/ML) 0.5% IN NEBU
2.5000 mg | INHALATION_SOLUTION | Freq: Four times a day (QID) | RESPIRATORY_TRACT | Status: DC
  Administered 2013-08-12 (×3): 2.5 mg via RESPIRATORY_TRACT
  Filled 2013-08-11 (×2): qty 0.5

## 2013-08-11 MED ORDER — METHYLPREDNISOLONE SODIUM SUCC 40 MG IJ SOLR
40.0000 mg | Freq: Four times a day (QID) | INTRAMUSCULAR | Status: DC
  Administered 2013-08-12 – 2013-08-14 (×10): 40 mg via INTRAVENOUS
  Filled 2013-08-11 (×14): qty 1

## 2013-08-11 MED ORDER — ATENOLOL 50 MG PO TABS
50.0000 mg | ORAL_TABLET | Freq: Every day | ORAL | Status: DC
  Administered 2013-08-12 – 2013-08-14 (×3): 50 mg via ORAL
  Filled 2013-08-11 (×5): qty 1

## 2013-08-11 MED ORDER — DEXTROSE 5 % IV SOLN
500.0000 mg | Freq: Once | INTRAVENOUS | Status: AC
  Administered 2013-08-11: 500 mg via INTRAVENOUS

## 2013-08-11 MED ORDER — DEXTROSE 5 % IV SOLN
1.0000 g | Freq: Once | INTRAVENOUS | Status: AC
  Administered 2013-08-11: 1 g via INTRAVENOUS
  Filled 2013-08-11: qty 10

## 2013-08-11 MED ORDER — ATORVASTATIN CALCIUM 40 MG PO TABS
40.0000 mg | ORAL_TABLET | Freq: Every day | ORAL | Status: DC
  Administered 2013-08-12 – 2013-08-17 (×6): 40 mg via ORAL
  Filled 2013-08-11 (×7): qty 1

## 2013-08-11 MED ORDER — PANTOPRAZOLE SODIUM 40 MG PO TBEC
80.0000 mg | DELAYED_RELEASE_TABLET | Freq: Every day | ORAL | Status: DC
  Administered 2013-08-12 – 2013-08-15 (×4): 80 mg via ORAL
  Filled 2013-08-11 (×5): qty 2

## 2013-08-11 MED ORDER — AMLODIPINE BESYLATE 10 MG PO TABS
10.0000 mg | ORAL_TABLET | Freq: Every day | ORAL | Status: DC
  Administered 2013-08-11: 10 mg via ORAL
  Filled 2013-08-11 (×2): qty 1

## 2013-08-11 MED ORDER — METHYLPREDNISOLONE SODIUM SUCC 125 MG IJ SOLR
125.0000 mg | Freq: Once | INTRAMUSCULAR | Status: AC
  Administered 2013-08-11: 125 mg via INTRAVENOUS
  Filled 2013-08-11: qty 2

## 2013-08-11 MED ORDER — ONDANSETRON HCL 4 MG PO TABS: 4.0000 mg | ORAL_TABLET | Freq: Four times a day (QID) | ORAL | Status: DC | PRN

## 2013-08-11 MED ORDER — SODIUM CHLORIDE 0.9 % IJ SOLN
3.0000 mL | Freq: Two times a day (BID) | INTRAMUSCULAR | Status: DC
  Administered 2013-08-15 – 2013-08-16 (×2): 3 mL via INTRAVENOUS

## 2013-08-11 NOTE — ED Notes (Signed)
Reports 2 episodes of diaphoresis last night, unknown fever. Non prod cough, +nasal congestion & SOB. States feels like unable to get a good breath in. Presently resp e/u, no distress.

## 2013-08-11 NOTE — ED Notes (Signed)
Venti mask removed and [place] at 4l applied.

## 2013-08-11 NOTE — ED Provider Notes (Signed)
CSN: [id number]     Arrival date & time 08/11/13  1655 History   First MD Initiated Contact with Patient 08/11/13 1719     Chief Complaint  Patient presents with  . Shortness of Breath   (Consider location/radiation/quality/duration/timing/severity/associated sxs/prior Treatment) HPI Mark [name] is a 67 y.o. male who presents to the emergency department from PCP with shortness of breath.  Patient sent from PCP.  Patient reports that he started developing a productive cough and shortness of breath last night.  Associated with fevers and chills.  No chest pain.  Just felt like he could not breathe.  This was to the point that he could not even smoke cigarettes.  Went in to PCP today and found to be hypoxic and wheezing.  O2 applied and a neublizer treatment administered.  Sent in here for further evaluation.  Patient feeling a little better.  No O2 requirement at home.  Past Medical History  Diagnosis Date  . GERD (gastroesophageal reflux disease)   . Hyperlipidemia   . Atrial fibrillation     stress test 10/09/12  . H/O hiatal hernia   . Arthritis   . Hypertension     sees Dr. Lynnea Ferrier  . Chronic kidney disease    Past Surgical History  Procedure Laterality Date  . Knee surgery      right  . Abdominal aortic aneurysm repair  10/15/2012    Procedure: ANEURYSM ABDOMINAL AORTIC REPAIR;  Surgeon: Pryor Ochoa, MD;  Location: [facility] OR;  Service: Vascular;  Laterality: N/A;  Resection and Grafting of Abdominal Aortic Aneurysm - Aortobi-Iliac   . Abdominal aortic aneurysm repair     Family History  Problem Relation Age of Onset  . Diabetes Mother   . Heart disease Father   . Heart attack Father    History  Substance Use Topics  . Smoking status: Current Every Day Smoker -- 0.30 packs/day for 50 years    Types: Cigarettes  . Smokeless tobacco: Never Used     Comment: pt states that he does not smoke over 5 cigs per day  . Alcohol Use: No    Review of Systems   Constitutional: Positive for fever and chills.  HENT: Negative for congestion and sore throat.   Respiratory: Positive for cough and shortness of breath.   Gastrointestinal: Negative for nausea, vomiting, abdominal pain, diarrhea and constipation.  Endocrine: Negative for polyuria.  Genitourinary: Negative for dysuria and hematuria.  Musculoskeletal: Negative for neck pain.  Skin: Negative for rash.  Neurological: Negative for headaches.  Psychiatric/Behavioral: Negative.   All other systems reviewed and are negative.    Allergies  Niaspan  Home Medications   Current Outpatient Rx  Name  Route  Sig  Dispense  Refill  . amLODipine (NORVASC) 10 MG tablet   Oral   Take 10 mg by mouth at bedtime.         Marland Kitchen atenolol (TENORMIN) 50 MG tablet   Oral   Take 50 mg by mouth daily.         Marland Kitchen atorvastatin (LIPITOR) 40 MG tablet   Oral   Take 40 mg by mouth daily.         Marland Kitchen losartan (COZAAR) 100 MG tablet   Oral   Take 100 mg by mouth daily.         Marland Kitchen omeprazole (PRILOSEC) 40 MG capsule   Oral   Take 40 mg by mouth every morning.  BP 137/71  Pulse 76  Temp(Src) 98.6 F (37 C) (Oral)  Resp 20  Ht 5\' 6"  (1.676 m)  Wt 146 lb (66.225 kg)  BMI 23.58 kg/m2  SpO2 93% Physical Exam  Nursing note and vitals reviewed. Constitutional: He is oriented to person, place, and time. He appears well-developed and well-nourished. No distress.  HENT:  Head: Normocephalic and atraumatic.  Right Ear: External ear normal.  Left Ear: External ear normal.  Mouth/Throat: Oropharynx is clear and moist. No oropharyngeal exudate.  Eyes: Conjunctivae are normal. Pupils are equal, round, and reactive to light. Right eye exhibits no discharge.  Neck: Normal range of motion. Neck supple. No tracheal deviation present.  Cardiovascular: Normal rate, regular rhythm and intact distal pulses.   Pulmonary/Chest: Effort normal. No respiratory distress. He has wheezes (diffuse). He has no  rales.  Abdominal: Soft. He exhibits no distension. There is no tenderness. There is no rebound and no guarding.  Musculoskeletal: Normal range of motion.  Neurological: He is alert and oriented to person, place, and time.  Skin: Skin is warm and dry. No rash noted. He is not diaphoretic.  Psychiatric: He has a normal mood and affect.    ED Course  Procedures (including critical care time) Labs Review Labs Reviewed  CBC WITH DIFFERENTIAL - Abnormal; Notable for the following:    Platelets 109 (*)    Neutrophils Relative % 88 (*)    Lymphocytes Relative 7 (*)    Lymphs Abs 0.6 (*)    All other components within normal limits  BASIC METABOLIC PANEL - Abnormal; Notable for the following:    Glucose, Bld 184 (*)    BUN 32 (*)    Creatinine, Ser 2.46 (*)    GFR calc non Af Amer 25 (*)    GFR calc Af Amer 29 (*)    All other components within normal limits   Imaging Review Dg Chest 2 View  08/11/2013   CLINICAL DATA:  Shortness of breath.  EXAM: CHEST  2 VIEW  COMPARISON:  Chest radiograph 10/16/2012.  FINDINGS: Stable cardiac and mediastinal contours. Minimal heterogeneous opacities left lung base. 8 mm nodular density within the right lower lung. No definite pleural effusion or pneumothorax Regional skeleton is unremarkable.  IMPRESSION: 1. Heterogeneous opacities left lung base may represent infection in the appropriate clinical setting. Radiographic followup to ensure resolution is recommended. 2. 8 mm nodular density projecting over the right lower lung demonstrated on prior CT 10/10/2012. Follow-up as per prior CT report.   Electronically Signed   By: Annia Belt M.D.   On: 08/11/2013 18:06    EKG Interpretation    Date/Time:  Tuesday August 11 2013 17:05:25 EST Ventricular Rate:  86 PR Interval:  125 QRS Duration: 87 QT Interval:  385 QTC Calculation: 460 R Axis:   26 Text Interpretation:  Sinus rhythm Normal ECG When compared with ECG of 10/15/2012, No significant change  was found Confirmed by [facility]  MD, DAVID ([id number]) on 08/11/2013 5:34:44 PM            MDM   1. COPD with exacerbation   2. CAP (community acquired pneumonia)   3. COPD exacerbation   4. HTN (hypertension)    Mark [name] is a 67 y.o. male who presented to the emergency department for concern of SOB and productive cough that started last night.  On exam, patient markedly hypoxic and with wheezing.  No distress.  CXR c/w pneumonia.  Rocephin/azithro started.  Patient requiring  admission for hypoxia.  Hospitalists consulted.  Patient admitted.    Arloa Koh, MD 08/12/13 1610

## 2013-08-11 NOTE — ED Notes (Signed)
POX 83% RA, placed on 4L [place]. Will monitor

## 2013-08-11 NOTE — H&P (Signed)
[facility] History and Physical  Mark [name] ZOX:[id number] DOB: 1946-01-26    PCP:   Leo Grosser, MD   Chief Complaint: sent over from PCP for hypoxia and coughs.  HPI: Mark [name] is an 67 y.o. male with hx of HTN, afib, AAA s/p repair, CKD with baseline Cr of 2.2, presents to the his PCP's office with 3 days hx of increase shortness of breath, wheezing, thick sputum productive cough, and was found to be hypoxic with Oxygen sat in the ?50%.  He was sent to the ER.  Evalaution in the ER with CXR showed left lung base infiltrate, and a 2.8 mm nodule seen on prior CT.  He has normal WBC and HB, and his Cr is elevated at 2.46.  He was given neb and oxygen, and felt better.  He unfortunately is a smoker, and only quit for 2 days since he started to get sick.  Rewiew of Systems:  Constitutional: Negative for malaise, fever and chills. No significant weight loss or weight gain Eyes: Negative for eye pain, redness and discharge, diplopia, visual changes, or flashes of light. ENMT: Negative for ear pain, hoarseness, nasal congestion, sinus pressure and sore throat. No headaches; tinnitus, drooling, or problem swallowing. Cardiovascular: Negative for chest pain, palpitations, diaphoresis,  and peripheral edema. ; No orthopnea, PND Respiratory: Negative for cough, hemoptysis, wheezing and stridor. No pleuritic chestpain. Gastrointestinal: Negative for nausea, vomiting, diarrhea, constipation, abdominal pain, melena, blood in stool, hematemesis, jaundice and rectal bleeding.    Genitourinary: Negative for frequency, dysuria, incontinence,flank pain and hematuria; Musculoskeletal: Negative for back pain and neck pain. Negative for swelling and trauma.;  Skin: . Negative for pruritus, rash, abrasions, bruising and skin lesion.; ulcerations Neuro: Negative for headache, lightheadedness and neck stiffness. Negative for weakness, altered level of consciousness , altered mental status,  extremity weakness, burning feet, involuntary movement, seizure and syncope.  Psych: negative for anxiety, depression, insomnia, tearfulness, panic attacks, hallucinations, paranoia, suicidal or homicidal ideation   Past Medical History  Diagnosis Date  . GERD (gastroesophageal reflux disease)   . Hyperlipidemia   . Atrial fibrillation     stress test 10/09/12  . H/O hiatal hernia   . Arthritis   . Hypertension     sees Dr. Lynnea Ferrier  . Chronic kidney disease     Past Surgical History  Procedure Laterality Date  . Knee surgery      right  . Abdominal aortic aneurysm repair  10/15/2012    Procedure: ANEURYSM ABDOMINAL AORTIC REPAIR;  Surgeon: Pryor Ochoa, MD;  Location: [facility] OR;  Service: Vascular;  Laterality: N/A;  Resection and Grafting of Abdominal Aortic Aneurysm - Aortobi-Iliac   . Abdominal aortic aneurysm repair      Medications:  HOME MEDS: Prior to Admission medications   Medication Sig Start Date End Date Taking? Authorizing Provider  amLODipine (NORVASC) 10 MG tablet Take 10 mg by mouth at bedtime.   Yes Historical Provider, MD  atenolol (TENORMIN) 50 MG tablet Take 50 mg by mouth daily.   Yes Historical Provider, MD  atorvastatin (LIPITOR) 40 MG tablet Take 40 mg by mouth daily.   Yes Historical Provider, MD  losartan (COZAAR) 100 MG tablet Take 100 mg by mouth daily.   Yes Historical Provider, MD  omeprazole (PRILOSEC) 40 MG capsule Take 40 mg by mouth every morning.    Yes Historical Provider, MD     Allergies:  Allergies  Allergen Reactions  . Niaspan [Niacin Er] Anaphylaxis  Social History:   reports that he has been smoking Cigarettes.  He has a 15 pack-year smoking history. He has never used smokeless tobacco. He reports that he does not drink alcohol or use illicit drugs.  Family History: Family History  Problem Relation Age of Onset  . Diabetes Mother   . Heart disease Father   . Heart attack Father      Physical Exam: Filed Vitals:    08/11/13 1930 08/11/13 2000 08/11/13 2030 08/11/13 2100  BP: 142/79 139/75 139/77 131/73  Pulse: 82 75 78 74  Temp:      TempSrc:      Resp: 23 19 15 18   Height:      Weight:      SpO2: 92% 94% 94% 92%   Blood pressure 131/73, pulse 74, temperature 98.6 F (37 C), temperature source Oral, resp. rate 18, height 5\' 6"  (1.676 m), weight 66.225 kg (146 lb), SpO2 92.00%.  GEN:  Pleasant patient lying in the stretcher in no acute distress; cooperative with exam. PSYCH:  alert and oriented x4; does not appear anxious or depressed; affect is appropriate. HEENT: Mucous membranes pink and anicteric; PERRLA; EOM intact; no cervical lymphadenopathy nor thyromegaly or carotid bruit; no JVD; There were no stridor. Neck is very supple. Breasts:: Not examined CHEST WALL: No tenderness CHEST: Severe wheezing bilaterally with no rales. HEART: Regular rate and rhythm.  There are no murmur, rub, or gallops.   BACK: No kyphosis or scoliosis; no CVA tenderness ABDOMEN: soft and non-tender; no masses, no organomegaly, normal abdominal bowel sounds; no pannus; no intertriginous candida. There is no rebound and no distention. Rectal Exam: Not done EXTREMITIES: No bone or joint deformity; age-appropriate arthropathy of the hands and knees; no edema; no ulcerations.  There is no calf tenderness. Genitalia: not examined PULSES: 2+ and symmetric SKIN: Normal hydration no rash or ulceration CNS: Cranial nerves 2-12 grossly intact no focal lateralizing neurologic deficit.  Speech is fluent; uvula elevated with phonation, facial symmetry and tongue midline. DTR are normal bilaterally, cerebella exam is intact, barbinski is negative and strengths are equaled bilaterally.  No sensory loss.   Labs on Admission:  Basic Metabolic Panel:  Recent Labs Lab 08/11/13 1735  NA 136  K 4.8  CL 99  CO2 19  GLUCOSE 184*  BUN 32*  CREATININE 2.46*  CALCIUM 9.2   Liver Function Tests: No results found for this  basename: AST, ALT, ALKPHOS, BILITOT, PROT, ALBUMIN,  in the last 168 hours No results found for this basename: LIPASE, AMYLASE,  in the last 168 hours No results found for this basename: AMMONIA,  in the last 168 hours CBC:  Recent Labs Lab 08/11/13 1735  WBC 7.8  NEUTROABS 6.9  HGB 13.3  HCT 40.4  MCV 94.4  PLT 109*   Cardiac Enzymes: No results found for this basename: CKTOTAL, CKMB, CKMBINDEX, TROPONINI,  in the last 168 hours  CBG: No results found for this basename: GLUCAP,  in the last 168 hours   Radiological Exams on Admission: Dg Chest 2 View  08/11/2013   CLINICAL DATA:  Shortness of breath.  EXAM: CHEST  2 VIEW  COMPARISON:  Chest radiograph 10/16/2012.  FINDINGS: Stable cardiac and mediastinal contours. Minimal heterogeneous opacities left lung base. 8 mm nodular density within the right lower lung. No definite pleural effusion or pneumothorax Regional skeleton is unremarkable.  IMPRESSION: 1. Heterogeneous opacities left lung base may represent infection in the appropriate clinical setting. Radiographic followup to ensure  resolution is recommended. 2. 8 mm nodular density projecting over the right lower lung demonstrated on prior CT 10/10/2012. Follow-up as per prior CT report.   Electronically Signed   By: Annia Belt M.D.   On: 08/11/2013 18:06   Assessment/Plan Present on Admission:  . COPD with exacerbation . AAA (abdominal aortic aneurysm) . Chronic kidney disease . HLD (hyperlipidemia) . HTN (hypertension) . CAP (community acquired pneumonia) . Tobacco abuse . COPD exacerbation  PLAN:  Will admit him for COPD exacerbation with possible CAP.  He will receive IV Rocephin and IV Zithromax, along with IV steroids, and frequent nebs.  He is stable, full code, and will be admitted to [facility] service.  He is encouraged to stop smoking.  It is IMPORTANT for him to follow up with his PCP regarding his pulmonary nodule as well.   Other plans as per orders.  Code  Status: FULL Unk Lightning, MD. [facility] Pager [phone] 7pm to 7am.  08/11/2013, 9:04 PM

## 2013-08-11 NOTE — ED Notes (Signed)
Placed pt on 4 liters of oxygen per RN [facility]) due to low saturations pt complaining of weakness of shortness of breath

## 2013-08-11 NOTE — ED Notes (Signed)
From MD's office, pt c/o SOB worsening past 2 days. MD reports pt was tachypneic, labored resp & low POX. Pt has had total nebs albuterol 10mg  & atrovent 0.5mg .

## 2013-08-11 NOTE — ED Provider Notes (Signed)
67 year old male who is a heavy smoker comes in with two-day history of cough and dyspnea. He went to his PCP where he was noted to be hypoxic and was brought in by ambulance. He was also noted to be be wheezing and received albuterol and ipratropium in route. He denies fever, chills, sweats. On exam, he appears mildly dyspneic but is on supplemental oxygen. He was initially hypoxic on arrival in the ED with oxygen saturation 83%. Lungs have coarse breath sounds with diminished breath sounds at the left base and has moderate wheezing is noted with cough or forced exhalation. Heart has regular rate and rhythm. Extremities no cyanosis or edema. Old records are reviewed and he he has had an endothelial repair of an abdominal aortic aneurysm last January. Chest x-ray suspicious for possible left lower lobe pneumonia. He started on antibiotics for community-acquired pneumonia and will need to be admitted.  I saw and evaluated the patient, reviewed the resident's note and I agree with the findings and plan.  EKG Interpretation    Date/Time:  Tuesday August 11 2013 17:05:25 EST Ventricular Rate:  86 PR Interval:  125 QRS Duration: 87 QT Interval:  385 QTC Calculation: 460 R Axis:   26 Text Interpretation:  Sinus rhythm Normal ECG When compared with ECG of 10/15/2012, No significant change was found Confirmed by [facility]  MD, Wyland Rastetter ([id number]) on 08/11/2013 5:34:44 PM            CRITICAL CARE Performed by: WUJWJ,XBJYN Total critical care time: 35 minutes Critical care time was exclusive of separately billable procedures and treating other patients. Critical care was necessary to treat or prevent imminent or life-threatening deterioration. Critical care was time spent personally by me on the following activities: development of treatment plan with patient and/or surrogate as well as nursing, discussions with consultants, evaluation of patient's response to treatment, examination of patient, obtaining  history from patient or surrogate, ordering and performing treatments and interventions, ordering and review of laboratory studies, ordering and review of radiographic studies, pulse oximetry and re-evaluation of patient's condition.   Dione Booze, MD 08/11/13 [phone]

## 2013-08-11 NOTE — Progress Notes (Signed)
Subjective:    Patient ID: Mark [name], male    DOB: [birth year], 67 y.o.   MRN: [id number]  HPI  Patient is a 67 year old white male mass in this afternoon complaining of cough and congestion for last few days. He has low-grade fever to 99.2. He reports dyspnea and difficulty breathing. His pulse oximetry on room air is 41%.  He is pale and discolored. He has intercostal retractions and supraclavicular retractions. On exam he is audibly wheezing with decreased breath sounds and poor air flow.  Patient was immediately placed on oxygen 6 L through nonrebreather mask. Pulse oximetry increased to 88%. He has been given a duo neb and dyspnea began to improve. 911 was called and the patient was referred to the emergency room via ambulance. Past Medical History  Diagnosis Date  . GERD (gastroesophageal reflux disease)   . Hyperlipidemia   . Atrial fibrillation     stress test 10/09/12  . H/O hiatal hernia   . Arthritis   . Hypertension     sees Dr. Lynnea Ferrier  . Chronic kidney disease    Past Surgical History  Procedure Laterality Date  . Knee surgery      right  . Abdominal aortic aneurysm repair  10/15/2012    Procedure: ANEURYSM ABDOMINAL AORTIC REPAIR;  Surgeon: Pryor Ochoa, MD;  Location: [facility] OR;  Service: Vascular;  Laterality: N/A;  Resection and Grafting of Abdominal Aortic Aneurysm - Aortobi-Iliac   . Abdominal aortic aneurysm repair     Current Outpatient Prescriptions on File Prior to Visit  Medication Sig Dispense Refill  . amLODipine (NORVASC) 10 MG tablet TAKE 1 TABLET BY MOUTH AT BEDTIME  30 tablet  5  . aspirin 81 MG tablet Take 81 mg by mouth every morning.       Marland Kitchen atenolol (TENORMIN) 50 MG tablet TAKE 1 TABLET (50 MG TOTAL) BY MOUTH DAILY.  30 tablet  2  . atorvastatin (LIPITOR) 40 MG tablet TAKE 1 TABLET AT BEDTIME.STOP TAKING SIMVASTATIN  30 tablet  3  . losartan (COZAAR) 100 MG tablet TAKE 1 TABLET EVERY DAY (DOSAGE INCREASED)  30 tablet  3  . omeprazole  (PRILOSEC) 40 MG capsule Take 40 mg by mouth every morning.        No current facility-administered medications on file prior to visit.   Allergies  Allergen Reactions  . Niaspan [Niacin Er] Anaphylaxis   History   Social History  . Marital Status: Married    Spouse Name: N/A    Number of Children: N/A  . Years of Education: N/A   Occupational History  . Not on file.   Social History Main Topics  . Smoking status: Current Every Day Smoker -- 0.30 packs/day for 50 years    Types: Cigarettes  . Smokeless tobacco: Never Used     Comment: pt states that he does not smoke over 5 cigs per day  . Alcohol Use: No  . Drug Use: No  . Sexual Activity: Not on file   Other Topics Concern  . Not on file   Social History Narrative  . No narrative on file     Review of Systems  All other systems reviewed and are negative.       Objective:   Physical Exam  Vitals reviewed. Neck: Neck supple. No JVD present.  Cardiovascular: Normal rate, regular rhythm and normal heart sounds.   Pulmonary/Chest: Accessory muscle usage present. Tachypnea noted. No respiratory distress. He has decreased breath sounds  in the right upper field, the right middle field, the right lower field, the left upper field and the left lower field. He has wheezes in the right upper field, the right lower field, the left upper field and the left lower field. He has no rhonchi. He has no rales.  Abdominal: Soft.  Musculoskeletal: He exhibits no edema.  Skin: There is pallor.          Assessment & Plan:  1. Dyspnea Fingerstick hemoglobin was 11.6. - Hemoglobin, fingerstick  2. Acute respiratory failure with hypoxia I believe the patient is suffering from a COPD exacerbation causing hypoxia and acute respiratory failure. He is referred to the emergency room for IV steroids, nebulizer treatments, and further workup.

## 2013-08-12 LAB — BASIC METABOLIC PANEL
CO2: 21 mEq/L (ref 19–32)
Calcium: 9 mg/dL (ref 8.4–10.5)
Glucose, Bld: 150 mg/dL — ABNORMAL HIGH (ref 70–99)
Sodium: 137 mEq/L (ref 135–145)

## 2013-08-12 LAB — CBC
Hemoglobin: 11.9 g/dL — ABNORMAL LOW (ref 13.0–17.0)
MCH: 32.5 pg (ref 26.0–34.0)
MCV: 93.7 fL (ref 78.0–100.0)
RBC: 3.66 MIL/uL — ABNORMAL LOW (ref 4.22–5.81)
WBC: 10.1 10*3/uL (ref 4.0–10.5)

## 2013-08-12 LAB — TROPONIN I
Troponin I: <0.3 ng/mL (ref <0.30)
Troponin I: <0.3 ng/mL (ref <0.30)

## 2013-08-12 MED ORDER — ALUM & MAG HYDROXIDE-SIMETH 200-200-20 MG/5ML PO SUSP
15.0000 mL | ORAL | Status: DC | PRN
  Administered 2013-08-12 – 2013-08-16 (×3): 15 mL via ORAL
  Filled 2013-08-12 (×3): qty 30

## 2013-08-12 MED ORDER — DILTIAZEM HCL 100 MG IV SOLR
5.0000 mg/h | INTRAVENOUS | Status: DC
  Administered 2013-08-12: 5 mg/h via INTRAVENOUS
  Administered 2013-08-13: 15 mg/h via INTRAVENOUS
  Filled 2013-08-12 (×3): qty 100

## 2013-08-12 MED ORDER — DILTIAZEM HCL 100 MG IV SOLR
5.0000 mg/h | INTRAVENOUS | Status: DC
  Filled 2013-08-12: qty 100

## 2013-08-12 MED ORDER — LEVALBUTEROL HCL 0.63 MG/3ML IN NEBU
0.6300 mg | INHALATION_SOLUTION | Freq: Four times a day (QID) | RESPIRATORY_TRACT | Status: DC | PRN
  Filled 2013-08-12: qty 3

## 2013-08-12 MED ORDER — DILTIAZEM HCL 25 MG/5ML IV SOLN
10.0000 mg | Freq: Once | INTRAVENOUS | Status: AC
  Administered 2013-08-12: 10 mg via INTRAVENOUS
  Filled 2013-08-12: qty 5

## 2013-08-12 NOTE — Progress Notes (Signed)
Patients heart rate noted to be in the 130's to a high of 150.  Per CCMD patient has converted from NSR to atrial fib.  RN in room patient states he could tell when his heart rate sped up.  Denies any chest pain.  MD notified, EKG obtained.  Will continue to monitor.

## 2013-08-12 NOTE — Clinical Documentation Improvement (Signed)
Presents with COPD exacerbation with possible Pneumonia; heavy smoker. Sent from PCP's office hypoxic with oxygen saturations at ?50% (noted in H&P).   Patient now receiving 4L of oxygen via nasal cannula  Saturations are ranging from 90 - 96%    Please clarify if you feel you are treating any of these conditions:   Acute Respiratory Failure Acute on Chronic Respiratory Failure Chronic Respiratory Failure Other Condition   Thank You, Shellee Milo ,RN Clinical Documentation Specialist:  [phone]  [facility]- Health Information Management

## 2013-08-12 NOTE — Progress Notes (Signed)
[facility] PROGRESS NOTE  Mark [name] GEX:[id number] DOB: March 09, 1946 DOA: 08/11/2013 PCP: Leo Grosser, MD  Assessment/Plan: 1. Acute respiratory failure: sec to COPD exacerbation and CAP: - Admitte dto tele metry.  - on IV steroids, IV rocpehin and IV zithromax, bronchodilators and oxygen as needed to keep sats >90%  2. ATrial fibrillation with RVR: - On atenolol.  - echocardiogram - one dose of IV cardizem given.  - 12 lead EKG shows afib with RVR in 130's  3. Hypertension  4. DVT prophylaxis.   Code Status: full code.  Family Communication: none at bedside Disposition Plan: pending  Consultants:  none  Procedures:  echocardiogram  Antibiotics:  rocpehin   zithromax  HPI/Subjective: No new complaints.   Objective: Filed Vitals:   08/12/13 1347  BP: 138/74  Pulse: 77  Temp: 98.2 F (36.8 C)  Resp: 18    Intake/Output Summary (Last 24 hours) at 08/12/13 1520 Last data filed at 08/12/13 1400  Gross per 24 hour  Intake   1150 ml  Output    400 ml  Net    750 ml   Filed Weights   08/11/13 1718  Weight: 66.225 kg (146 lb)    Exam:   General:  Alert afebrile comfortable  Cardiovascular: s1s2 tachycardic  Respiratory: ctab  Abdomen: soft NT ND BS+  Musculoskeletal: no pedal edema  Data Reviewed: Basic Metabolic Panel:  Recent Labs Lab 08/11/13 1735 08/12/13 1230  NA 136 137  K 4.8 4.7  CL 99 103  CO2 19 21  GLUCOSE 184* 150*  BUN 32* 36*  CREATININE 2.46* 2.26*  CALCIUM 9.2 9.0   Liver Function Tests: No results found for this basename: AST, ALT, ALKPHOS, BILITOT, PROT, ALBUMIN,  in the last 168 hours No results found for this basename: LIPASE, AMYLASE,  in the last 168 hours No results found for this basename: AMMONIA,  in the last 168 hours CBC:  Recent Labs Lab 08/11/13 1735 08/12/13 1230  WBC 7.8 10.1  NEUTROABS 6.9  --   HGB 13.3 11.9*  HCT 40.4 34.3*  MCV 94.4 93.7  PLT 109* 112*   Cardiac  Enzymes: No results found for this basename: CKTOTAL, CKMB, CKMBINDEX, TROPONINI,  in the last 168 hours BNP (last 3 results) No results found for this basename: PROBNP,  in the last 8760 hours CBG: No results found for this basename: GLUCAP,  in the last 168 hours  No results found for this or any previous visit (from the past 240 hour(s)).   Studies: Dg Chest 2 View  08/11/2013   CLINICAL DATA:  Shortness of breath.  EXAM: CHEST  2 VIEW  COMPARISON:  Chest radiograph 10/16/2012.  FINDINGS: Stable cardiac and mediastinal contours. Minimal heterogeneous opacities left lung base. 8 mm nodular density within the right lower lung. No definite pleural effusion or pneumothorax Regional skeleton is unremarkable.  IMPRESSION: 1. Heterogeneous opacities left lung base may represent infection in the appropriate clinical setting. Radiographic followup to ensure resolution is recommended. 2. 8 mm nodular density projecting over the right lower lung demonstrated on prior CT 10/10/2012. Follow-up as per prior CT report.   Electronically Signed   By: Annia Belt M.D.   On: 08/11/2013 18:06    Scheduled Meds: . atenolol  50 mg Oral Daily  . atorvastatin  40 mg Oral Daily  . azithromycin  500 mg Intravenous Q24H  . cefTRIAXone (ROCEPHIN)  IV  1 g Intravenous Q24H  . docusate sodium  100 mg  Oral BID  . heparin  5,000 Units Subcutaneous Q8H  . losartan  100 mg Oral Daily  . methylPREDNISolone (SOLU-MEDROL) injection  40 mg Intravenous Q6H  . pantoprazole  80 mg Oral Daily  . sodium chloride  3 mL Intravenous Q12H   Continuous Infusions: . sodium chloride 75 mL/hr at 08/12/13 1610    Principal Problem:   COPD with exacerbation Active Problems:   AAA (abdominal aortic aneurysm)   HTN (hypertension)   HLD (hyperlipidemia)   Chronic kidney disease   CAP (community acquired pneumonia)   Tobacco abuse   COPD exacerbation    Time spent: 25 min    Trustin Chapa  [facility] Pager  [phone] If 7PM-7AM, please contact night-coverage at [email or web address], password [facility] 08/12/2013, 3:20 PM  LOS: 1 day

## 2013-08-12 NOTE — Progress Notes (Signed)
PT Cancellation Note  Patient Details Name: Mark [name] MRN: [id number] DOB: [birth year]   Cancelled Treatment:    Reason Eval/Treat Not Completed: Medical issues which prohibited therapy (Tachycardia at rest) Will follow up for PT eval 11/28   Van Clines [facility] 08/12/2013, 4:14 PM

## 2013-08-13 DIAGNOSIS — I059 Rheumatic mitral valve disease, unspecified: Secondary | ICD-10-CM

## 2013-08-13 LAB — PROTIME-INR
INR: 0.95 (ref 0.00–1.49)
Prothrombin Time: 12.5 seconds (ref 11.6–15.2)

## 2013-08-13 MED ORDER — HEPARIN (PORCINE) IN NACL 100-0.45 UNIT/ML-% IJ SOLN
800.0000 [IU]/h | INTRAMUSCULAR | Status: DC
  Administered 2013-08-13 – 2013-08-14 (×2): 950 [IU]/h via INTRAVENOUS
  Administered 2013-08-14: 1050 [IU]/h via INTRAVENOUS
  Administered 2013-08-15: 800 [IU]/h via INTRAVENOUS
  Filled 2013-08-13 (×5): qty 250

## 2013-08-13 MED ORDER — WARFARIN SODIUM 7.5 MG PO TABS
7.5000 mg | ORAL_TABLET | Freq: Once | ORAL | Status: AC
  Administered 2013-08-13: 7.5 mg via ORAL
  Filled 2013-08-13: qty 1

## 2013-08-13 MED ORDER — DILTIAZEM HCL 60 MG PO TABS
60.0000 mg | ORAL_TABLET | Freq: Four times a day (QID) | ORAL | Status: DC
  Administered 2013-08-13 – 2013-08-14 (×4): 60 mg via ORAL
  Filled 2013-08-13 (×8): qty 1

## 2013-08-13 MED ORDER — WARFARIN VIDEO: Freq: Once | Status: DC

## 2013-08-13 MED ORDER — WARFARIN - PHARMACIST DOSING INPATIENT
Freq: Every day | Status: DC
  Administered 2013-08-14 – 2013-08-15 (×2)

## 2013-08-13 MED ORDER — HEPARIN BOLUS VIA INFUSION
4000.0000 [IU] | Freq: Once | INTRAVENOUS | Status: AC
  Administered 2013-08-13: 4000 [IU] via INTRAVENOUS
  Filled 2013-08-13: qty 4000

## 2013-08-13 MED ORDER — PATIENT'S GUIDE TO USING COUMADIN BOOK
Freq: Once | Status: AC
  Administered 2013-08-13: 18:00:00
  Filled 2013-08-13: qty 1

## 2013-08-13 NOTE — Progress Notes (Signed)
ANTICOAGULATION CONSULT NOTE - Initial Consult  Pharmacy Consult for Heparin and Coumadin Indication: atrial fibrillation  Allergies  Allergen Reactions  . Niaspan [Niacin Er] Anaphylaxis    Patient Measurements: Height: 5\' 6"  (167.6 cm) Weight: 146 lb (66.225 kg) IBW/kg (Calculated) : 63.8 Heparin Dosing Weight: 66.2 kg  Vital Signs: Temp: 98 F (36.7 C) (11/27 1344) Temp src: Oral (11/27 1344) BP: 125/71 mmHg (11/27 1344) Pulse Rate: 107 (11/27 1344)  Labs:  Recent Labs  08/11/13 1735 08/12/13 1230 08/12/13 1740 08/12/13 2150 08/13/13 0352  HGB 13.3 11.9*  --   --   --   HCT 40.4 34.3*  --   --   --   PLT 109* 112*  --   --   --   CREATININE 2.46* 2.26*  --   --   --   TROPONINI  --   --  <0.30 <0.30 <0.30    Estimated Creatinine Clearance: 28.2 ml/min (by C-G formula based on Cr of 2.26).   Medical History: Past Medical History  Diagnosis Date  . GERD (gastroesophageal reflux disease)   . Hyperlipidemia   . Atrial fibrillation     stress test 10/09/12  . H/O hiatal hernia   . Arthritis   . Hypertension     sees Dr. Lynnea Ferrier  . Chronic kidney disease    Assessment:   67 yr old man admitted 11/25 pm with COPD exacerbation, CAP, and afib with RVR.  Day # 3 Azithromycin and Ceftriaxone.  On IV steroids.   To begin Heparin and Coumadin for afib.  Changing from IV to PO Diltiazem today.   Low baseline platelet count; patient reports easy bruising. No baseline coags or LFTs.  Goal of Therapy:  INR 2-3 Heparin level 0.3-0.7 units/ml Monitor platelets by anticoagulation protocol: Yes   Plan:   Baseline PT/INR ordered.  Heparin 4000 units IV bolus, followed by 950 units/hr.  Heparin level ~ 8 hrs after drip begins.  Coumadin 7.5 mg tonight.  Coumadin education materials to be provided.  Daily heparin level, PT/INR and CBC.  Cmet in am, to check LFTs.  Dennie Fetters, [place] Pager: [phone] 08/13/2013,2:04 PM

## 2013-08-13 NOTE — Progress Notes (Signed)
[facility] PROGRESS NOTE  Mark [name] ZOX:[id number] DOB: May 22, 1946 DOA: 08/11/2013 PCP: Leo Grosser, MD  Assessment/Plan: 1. Acute respiratory failure: sec to COPD exacerbation and CAP: - Admitted to telemetry.  - on IV steroids, IV rocpehin and IV zithromax, bronchodilators and oxygen as needed to keep sats >90%. His breathing has improved a little.   2. Atrial fibrillation with RVR: better rate controlled.  - On atenolol.  - echocardiogram done and results pending.  - one dose of IV cardizem given and on cardizem drip. His rate between 95 to 115.  - 12 lead EKG shows afib with RVR in 130's  3. Hypertension - controlled.   4. Stage 3 CKD:  - At baseline.   5. Anemia: mild.  - continue to monitor.   4. DVT prophylaxis.   Code Status: full code.  Family Communication: none at bedside Disposition Plan: pending  Consultants: Cardiology consulted.  Procedures:  echocardiogram  Antibiotics:  rocpehin   zithromax  HPI/Subjective: No new complaints. Reports his heartburn improved a little with medication and milk.   Objective: Filed Vitals:   08/13/13 0433  BP: 117/83  Pulse: 112  Temp: 98.4 F (36.9 C)  Resp: 18    Intake/Output Summary (Last 24 hours) at 08/13/13 1122 Last data filed at 08/13/13 0900  Gross per 24 hour  Intake    840 ml  Output   1550 ml  Net   -710 ml   Filed Weights   08/11/13 1718  Weight: 66.225 kg (146 lb)    Exam:   General:  Alert afebrile comfortable  Cardiovascular: s1s2 tachycardic  Respiratory: ctab, scattered wheezing posteriorly.   Abdomen: soft NT ND BS+  Musculoskeletal: no pedal edema  Data Reviewed: Basic Metabolic Panel:  Recent Labs Lab 08/11/13 1735 08/12/13 1230  NA 136 137  K 4.8 4.7  CL 99 103  CO2 19 21  GLUCOSE 184* 150*  BUN 32* 36*  CREATININE 2.46* 2.26*  CALCIUM 9.2 9.0   Liver Function Tests: No results found for this basename: AST, ALT, ALKPHOS, BILITOT,  PROT, ALBUMIN,  in the last 168 hours No results found for this basename: LIPASE, AMYLASE,  in the last 168 hours No results found for this basename: AMMONIA,  in the last 168 hours CBC:  Recent Labs Lab 08/11/13 1735 08/12/13 1230  WBC 7.8 10.1  NEUTROABS 6.9  --   HGB 13.3 11.9*  HCT 40.4 34.3*  MCV 94.4 93.7  PLT 109* 112*   Cardiac Enzymes:  Recent Labs Lab 08/12/13 1740 08/12/13 2150 08/13/13 0352  TROPONINI <0.30 <0.30 <0.30   BNP (last 3 results) No results found for this basename: PROBNP,  in the last 8760 hours CBG: No results found for this basename: GLUCAP,  in the last 168 hours  Recent Results (from the past 240 hour(s))  CULTURE, BLOOD (ROUTINE X 2)     Status: None   Collection Time    08/11/13  6:28 PM      Result Value Range Status   Specimen Description BLOOD LEFT ANTECUBITAL   Final   Special Requests BOTTLES DRAWN AEROBIC ONLY   Final   Culture  Setup Time     Final   Value: 08/12/2013 08:59     Performed at [facility]   Culture     Final   Value:        BLOOD CULTURE RECEIVED NO GROWTH TO DATE CULTURE WILL BE HELD FOR 5 DAYS BEFORE ISSUING  A FINAL NEGATIVE REPORT     Performed at [facility]   Report Status PENDING   Incomplete     Studies: Dg Chest 2 View  08/11/2013   CLINICAL DATA:  Shortness of breath.  EXAM: CHEST  2 VIEW  COMPARISON:  Chest radiograph 10/16/2012.  FINDINGS: Stable cardiac and mediastinal contours. Minimal heterogeneous opacities left lung base. 8 mm nodular density within the right lower lung. No definite pleural effusion or pneumothorax Regional skeleton is unremarkable.  IMPRESSION: 1. Heterogeneous opacities left lung base may represent infection in the appropriate clinical setting. Radiographic followup to ensure resolution is recommended. 2. 8 mm nodular density projecting over the right lower lung demonstrated on prior CT 10/10/2012. Follow-up as per prior CT report.   Electronically Signed    By: Annia Belt M.D.   On: 08/11/2013 18:06    Scheduled Meds: . atenolol  50 mg Oral Daily  . atorvastatin  40 mg Oral Daily  . azithromycin  500 mg Intravenous Q24H  . cefTRIAXone (ROCEPHIN)  IV  1 g Intravenous Q24H  . docusate sodium  100 mg Oral BID  . heparin  5,000 Units Subcutaneous Q8H  . losartan  100 mg Oral Daily  . methylPREDNISolone (SOLU-MEDROL) injection  40 mg Intravenous Q6H  . pantoprazole  80 mg Oral Daily  . sodium chloride  3 mL Intravenous Q12H   Continuous Infusions: . diltiazem (CARDIZEM) infusion 15 mg/hr (08/13/13 0220)    Principal Problem:   COPD with exacerbation Active Problems:   AAA (abdominal aortic aneurysm)   HTN (hypertension)   HLD (hyperlipidemia)   Chronic kidney disease   CAP (community acquired pneumonia)   Tobacco abuse   COPD exacerbation    Time spent: 25 min    Rob Mciver  [facility] Pager [phone] If 7PM-7AM, please contact night-coverage at [email or web address], password [facility] 08/13/2013, 11:22 AM  LOS: 2 days

## 2013-08-13 NOTE — Progress Notes (Signed)
  Echocardiogram 2D Echocardiogram has been performed.  Cathie Beams 08/13/2013, 8:48 AM

## 2013-08-13 NOTE — Consult Note (Signed)
CARDIOLOGY CONSULT NOTE   Patient ID: Mark [name] MRN: [id number], DOB/AGE: 1946-01-21   Admit date: 08/11/2013 Date of Consult: 08/13/2013   Primary Physician: Leo Grosser, MD Primary Cardiologist: None  Pt. Profile  67 year old gentleman admitted with respiratory insufficiency and hypoxemia.  Problem List  Past Medical History  Diagnosis Date  . GERD (gastroesophageal reflux disease)   . Hyperlipidemia   . Atrial fibrillation     stress test 10/09/12  . H/O hiatal hernia   . Arthritis   . Hypertension     sees Dr. Lynnea Ferrier  . Chronic kidney disease     Past Surgical History  Procedure Laterality Date  . Knee surgery      right  . Abdominal aortic aneurysm repair  10/15/2012    Procedure: ANEURYSM ABDOMINAL AORTIC REPAIR;  Surgeon: Pryor Ochoa, MD;  Location: [facility] OR;  Service: Vascular;  Laterality: N/A;  Resection and Grafting of Abdominal Aortic Aneurysm - Aortobi-Iliac   . Abdominal aortic aneurysm repair       Allergies  Allergies  Allergen Reactions  . Niaspan [Niacin Er] Anaphylaxis    HPI  This 67 year old gentleman was admitted from his PCP office on 08/11/13 because of cough and acute exacerbation of COPD and suspected community-acquired pneumonia.  Initially his heart rhythm was normal sinus rhythm.  Yesterday he went into atrial fibrillation with rapid ventricular response.  He was placed on IV diltiazem.  He was already on atenolol for previous outpatient therapy of his hypertension.  The patient is not aware as to whether he has ever had atrial fibrillation in the past or not.  His past history is positive for COPD and ongoing tobacco use.  In January 2014 he was found to have a 7 cm abdominal aortic aneurysm.  He underwent preoperative risk assessment with a Lexus scan Myoview stress which showed an ejection fraction of 59% and no evidence of ischemia.  He went on to have a successful resection and grafting of his abdominal aortic  aneurysm by Dr. Hart Rochester.  He has done well since that time with no new cardiac symptoms or events.  He has continued to smoke a pack of cigarettes a day up until 2 days ago when his breathing became worse.  Inpatient Medications  . atenolol  50 mg Oral Daily  . atorvastatin  40 mg Oral Daily  . azithromycin  500 mg Intravenous Q24H  . cefTRIAXone (ROCEPHIN)  IV  1 g Intravenous Q24H  . docusate sodium  100 mg Oral BID  . heparin  5,000 Units Subcutaneous Q8H  . losartan  100 mg Oral Daily  . methylPREDNISolone (SOLU-MEDROL) injection  40 mg Intravenous Q6H  . pantoprazole  80 mg Oral Daily  . sodium chloride  3 mL Intravenous Q12H    Family History Family History  Problem Relation Age of Onset  . Diabetes Mother   . Heart disease Father   . Heart attack Father      Social History History   Social History  . Marital Status: Married    Spouse Name: N/A    Number of Children: N/A  . Years of Education: N/A   Occupational History  . Not on file.   Social History Main Topics  . Smoking status: Current Every Day Smoker -- 0.30 packs/day for 50 years    Types: Cigarettes  . Smokeless tobacco: Never Used     Comment: pt states that he does not smoke over 5 cigs per day  .  Alcohol Use: No  . Drug Use: No  . Sexual Activity: Not on file   Other Topics Concern  . Not on file   Social History Narrative  . No narrative on file     Review of Systems  General:  No chills, fever, night sweats or weight changes.  Cardiovascular:  No chest pain, dyspnea on exertion, edema, orthopnea, palpitations, paroxysmal nocturnal dyspnea. Dermatological: No rash, lesions/masses Respiratory: Positive for cough and dyspnea Urologic: No hematuria, dysuria Abdominal:   No nausea, vomiting, diarrhea, bright red blood per rectum, melena, or hematemesis Neurologic:  No visual changes, wkns, changes in mental status. All other systems reviewed and are otherwise negative except as noted  above.  Physical Exam  Blood pressure 117/83, pulse 112, temperature 98.4 F (36.9 C), temperature source Oral, resp. rate 18, height 5\' 6"  (1.676 m), weight 146 lb (66.225 kg), SpO2 97.00%.  General: Pleasant, NAD.  Nasal oxygen in place. Psych: Normal affect. Neuro: Alert and oriented X 3. Moves all extremities spontaneously. HEENT: Normal  Neck: Supple without bruits or JVD. Lungs:  Moderate expiratory wheezes.  Generally distant breath sounds. Heart: RRR no s3, s4, or murmurs.  Pulse is irregular in atrial fibrillation Abdomen: Soft, non-tender, non-distended, BS + x 4.  Extremities: No clubbing, cyanosis or edema. DP/PT/Radials 2+ and equal bilaterally.  Labs   Recent Labs  08/12/13 1740 08/12/13 2150 08/13/13 0352  TROPONINI <0.30 <0.30 <0.30   Lab Results  Component Value Date   WBC 10.1 08/12/2013   HGB 11.9* 08/12/2013   HCT 34.3* 08/12/2013   MCV 93.7 08/12/2013   PLT 112* 08/12/2013     Recent Labs Lab 08/12/13 1230  NA 137  K 4.7  CL 103  CO2 21  BUN 36*  CREATININE 2.26*  CALCIUM 9.0  GLUCOSE 150*   Lab Results  Component Value Date   CHOL 120 04/13/2013   HDL 35* 04/13/2013   LDLCALC 61 04/13/2013   TRIG 118 04/13/2013   No results found for this basename: DDIMER    Radiology/Studies  Dg Chest 2 View  08/11/2013   CLINICAL DATA:  Shortness of breath.  EXAM: CHEST  2 VIEW  COMPARISON:  Chest radiograph 10/16/2012.  FINDINGS: Stable cardiac and mediastinal contours. Minimal heterogeneous opacities left lung base. 8 mm nodular density within the right lower lung. No definite pleural effusion or pneumothorax Regional skeleton is unremarkable.  IMPRESSION: 1. Heterogeneous opacities left lung base may represent infection in the appropriate clinical setting. Radiographic followup to ensure resolution is recommended. 2. 8 mm nodular density projecting over the right lower lung demonstrated on prior CT 10/10/2012. Follow-up as per prior CT report.    Electronically Signed   By: Annia Belt M.D.   On: 08/11/2013 18:06    ECG Atrial fibrillation with rapid ventricular response.  No acute ischemic changes.  2-D echo on 08/13/13: - Left ventricle: The cavity size was normal. Wall thickness was normal. Systolic function was normal. The estimated ejection fraction was in the range of 55% to 60%. Wall motion was normal; there were no regional wall motion abnormalities. - Mitral valve: Mild regurgitation. - Left atrium: The atrium was mildly dilated.   ASSESSMENT AND PLAN 1. paroxysmal atrial fibrillation     Probably secondary to exacerbation of his COPD. He has a Chadsvasc score of 3 ( age, hypertension and vascular disease.). Will start IV heparin and warfarin.  Will switch to oral diltiazem for rate control. I expect he may go back  into NSR after his lung status has improved further. If not would consider outpatient DCCV after a month of adequate anticoagulation.  2. severe COPD with probable left lower lobe pneumonia  3. tobacco abuse  4. hypertensive cardiovascular disease  5. status post successful resection and grafting of large abdominal aortic aneurysm January 2014  6.  Renal insufficiency  Signed,  Cassell Clement, MD  08/13/2013, 1:10 PM

## 2013-08-14 ENCOUNTER — Inpatient Hospital Stay (HOSPITAL_COMMUNITY): Payer: Medicare Other

## 2013-08-14 DIAGNOSIS — N189 Chronic kidney disease, unspecified: Secondary | ICD-10-CM

## 2013-08-14 DIAGNOSIS — I4891 Unspecified atrial fibrillation: Secondary | ICD-10-CM

## 2013-08-14 DIAGNOSIS — E875 Hyperkalemia: Secondary | ICD-10-CM | POA: Diagnosis present

## 2013-08-14 LAB — HEPARIN LEVEL (UNFRACTIONATED)
Heparin Unfractionated: 0.37 IU/mL (ref 0.30–0.70)
Heparin Unfractionated: 0.26 IU/mL — ABNORMAL LOW (ref 0.30–0.70)

## 2013-08-14 LAB — BASIC METABOLIC PANEL
BUN: 58 mg/dL — ABNORMAL HIGH (ref 6–23)
CO2: 21 mEq/L (ref 19–32)
Chloride: 102 mEq/L (ref 96–112)
Creatinine, Ser: 2.41 mg/dL — ABNORMAL HIGH (ref 0.50–1.35)
Glucose, Bld: 198 mg/dL — ABNORMAL HIGH (ref 70–99)
Sodium: 134 mEq/L — ABNORMAL LOW (ref 135–145)

## 2013-08-14 LAB — COMPREHENSIVE METABOLIC PANEL
ALT: 21 U/L (ref 0–53)
AST: 14 U/L (ref 0–37)
Albumin: 3.1 g/dL — ABNORMAL LOW (ref 3.5–5.2)
Alkaline Phosphatase: 62 U/L (ref 39–117)
Chloride: 102 mEq/L (ref 96–112)
Glucose, Bld: 151 mg/dL — ABNORMAL HIGH (ref 70–99)
Potassium: 5.8 mEq/L — ABNORMAL HIGH (ref 3.5–5.1)
Sodium: 136 mEq/L (ref 135–145)
Total Protein: 6.4 g/dL (ref 6.0–8.3)

## 2013-08-14 LAB — CBC
Hemoglobin: 11.7 g/dL — ABNORMAL LOW (ref 13.0–17.0)
MCH: 32.2 pg (ref 26.0–34.0)
MCHC: 33.7 g/dL (ref 30.0–36.0)
Platelets: 175 10*3/uL (ref 150–400)
RDW: 13.5 % (ref 11.5–15.5)
WBC: 12.6 10*3/uL — ABNORMAL HIGH (ref 4.0–10.5)

## 2013-08-14 LAB — PROTIME-INR
INR: 1.07 (ref 0.00–1.49)
Prothrombin Time: 13.7 seconds (ref 11.6–15.2)

## 2013-08-14 MED ORDER — SODIUM POLYSTYRENE SULFONATE 15 GM/60ML PO SUSP
15.0000 g | Freq: Once | ORAL | Status: AC
  Administered 2013-08-14: 15 g via ORAL
  Filled 2013-08-14: qty 60

## 2013-08-14 MED ORDER — DILTIAZEM HCL 90 MG PO TABS
90.0000 mg | ORAL_TABLET | Freq: Four times a day (QID) | ORAL | Status: DC
  Administered 2013-08-14 – 2013-08-15 (×4): 90 mg via ORAL
  Filled 2013-08-14 (×8): qty 1

## 2013-08-14 MED ORDER — LOSARTAN POTASSIUM 50 MG PO TABS: 50.0000 mg | ORAL_TABLET | Freq: Every day | ORAL | Status: DC

## 2013-08-14 MED ORDER — METHYLPREDNISOLONE SODIUM SUCC 40 MG IJ SOLR
40.0000 mg | Freq: Two times a day (BID) | INTRAMUSCULAR | Status: DC
  Administered 2013-08-14 – 2013-08-16 (×4): 40 mg via INTRAVENOUS
  Filled 2013-08-14 (×6): qty 1

## 2013-08-14 MED ORDER — WARFARIN SODIUM 7.5 MG PO TABS
7.5000 mg | ORAL_TABLET | Freq: Once | ORAL | Status: AC
  Administered 2013-08-14: 7.5 mg via ORAL
  Filled 2013-08-14: qty 1

## 2013-08-14 NOTE — Progress Notes (Signed)
Subjective: Patient denies CP  Breathing is steady Objective: Filed Vitals:   08/13/13 1344 08/13/13 1943 08/14/13 0046 08/14/13 0548  BP: 125/71 117/77 109/77 112/77  Pulse: 107 100  107  Temp: 98 F (36.7 C) 98 F (36.7 C)  98.1 F (36.7 C)  TempSrc: Oral Oral  Oral  Resp: 18 18    Height:      Weight:      SpO2: 100% 5%  94%   Weight change:   Intake/Output Summary (Last 24 hours) at 08/14/13 0759 Last data filed at 08/14/13 0600  Gross per 24 hour  Intake    354 ml  Output   1725 ml  Net  -1371 ml    General: Alert, awake, oriented x3, in no acute distress Neck:  JVP is normal Heart: Regular rate and rhythm, without murmurs, rubs, gallops.  Lungs: Clear to auscultation.  No rales or wheezes. Exemities:  No edema.   Neuro: Grossly intact, nonfocal.  Tele:  Afib 100s Lab Results: Results for orders placed during the hospital encounter of 08/11/13 (from the past 24 hour(s))  PROTIME-INR     Status: None   Collection Time    08/13/13  2:31 PM      Result Value Range   Prothrombin Time 12.5  11.6 - 15.2 seconds   INR 0.95  0.00 - 1.49  HEPARIN LEVEL (UNFRACTIONATED)     Status: None   Collection Time    08/14/13 12:50 AM      Result Value Range   Heparin Unfractionated 0.37  0.30 - 0.70 IU/mL  PROTIME-INR     Status: None   Collection Time    08/14/13  6:10 AM      Result Value Range   Prothrombin Time 13.7  11.6 - 15.2 seconds   INR 1.07  0.00 - 1.49  HEPARIN LEVEL (UNFRACTIONATED)     Status: Abnormal   Collection Time    08/14/13  6:10 AM      Result Value Range   Heparin Unfractionated 0.26 (*) 0.30 - 0.70 IU/mL  CBC     Status: Abnormal   Collection Time    08/14/13  6:10 AM      Result Value Range   WBC 12.6 (*) 4.0 - 10.5 K/uL   RBC 3.63 (*) 4.22 - 5.81 MIL/uL   Hemoglobin 11.7 (*) 13.0 - 17.0 g/dL   HCT 69.6 (*) 29.5 - 28.4 %   MCV 95.6  78.0 - 100.0 fL   MCH 32.2  26.0 - 34.0 pg   MCHC 33.7  30.0 - 36.0 g/dL   RDW 13.2  44.0 - 10.2 %   Platelets 175  150 - 400 K/uL    Studies/Results: @RISRSLT24 @  Medications: Reviewed   @PROBHOSP @  1.  Atrial fib Rates are still high  Exacerbation felt secondary to COPD exacerbation. I would increase dilt to 90 qid  COntinue atenolol   Initiating warfarin. Plan is to rate control for a few weeks  Optimize pulmonary status.  Then cardiovert If rates not controllable may need TEE cardioversion sooner.  2.  COPD  Severe  3.  Hyperkalemia  K was 5.8 this AM  Repeat stat to confirm  And Rx.  May need to d/c ARB if truly high.  4.  HTN  Follow with changes.    LOS: 3 days   Dietrich Pates 08/14/2013, 7:59 AM

## 2013-08-14 NOTE — Progress Notes (Addendum)
PT Cancellation Note  Patient Details Name: Mark [name] MRN: [id number] DOB: 10/29/1945   Cancelled Treatment:    Reason Eval Not Completed: Medical issues which prohibited therapy  Pt tachycardic at rest, HR 105 (per MD orders goal HR is 60-105)  Pt on bedrest with bathroom privileges  K+ 5.8 with irregular rhythm  Will attempt PT evaluation when activity orders increased and medically appropriate.   Mark [name] 08/14/2013, 9:11 AM Pager [phone]

## 2013-08-14 NOTE — Progress Notes (Signed)
ANTICOAGULATION CONSULT NOTE - Follow Up Consult  Pharmacy Consult for Heparin/Coumadin Indication: atrial fibrillation  Allergies  Allergen Reactions  . Niaspan [Niacin Er] Anaphylaxis    Patient Measurements: Height: 5\' 6"  (167.6 cm) Weight: 146 lb (66.225 kg) IBW/kg (Calculated) : 63.8  Vital Signs: Temp: 98.1 F (36.7 C) (11/28 0548) Temp src: Oral (11/28 0548) BP: 112/77 mmHg (11/28 0548) Pulse Rate: 107 (11/28 0548)  Labs:  Recent Labs  08/11/13 1735 08/12/13 1230 08/12/13 1740 08/12/13 2150 08/13/13 0352 08/13/13 1431 08/14/13 0050 08/14/13 0610  HGB 13.3 11.9*  --   --   --   --   --  11.7*  HCT 40.4 34.3*  --   --   --   --   --  34.7*  PLT 109* 112*  --   --   --   --   --  175  LABPROT  --   --   --   --   --  12.5  --  13.7  INR  --   --   --   --   --  0.95  --  1.07  HEPARINUNFRC  --   --   --   --   --   --  0.37 0.26*  CREATININE 2.46* 2.26*  --   --   --   --   --  2.48*  TROPONINI  --   --  <0.30 <0.30 <0.30  --   --   --     Estimated Creatinine Clearance: 25.7 ml/min (by C-G formula based on Cr of 2.48).   Medications:  Infusions:  . heparin 950 Units/hr (08/13/13 1620)    Assessment: 67 year old male admitted with pneumonia, COPD exacerbation, and found to have new-onset atrial fibrillation.  He is receiving Heparin bridging to Coumadin.  His heparin level was slightly subtherapeutic this morning and his rate was increased.  His INR is 1.07 after only 1 dose of Coumadin which is appropriate.  His hemoglobin is stable today, and his platelet count has improved.  Note his potassium is 5.8 and he is receiving losartan.  Dr. Tenny Craw ordered a repeat potassium to confirm.  Goal of Therapy:  INR 2-3 Heparin level 0.3-0.7 units/ml Monitor platelets by anticoagulation protocol: Yes   Plan:  Continue Heparin at 1050 units/hr Check 8hr heparin level Coumadin 7.5mg  today Daily PT/INR Will continue Coumadin education with  patient/family. Follow-up potassium to evaluate if losartan may require discontinuation  Estella Husk, Pharm.D., BCPS, AAHIVP Clinical Pharmacist Phone: [phone] or [phone] 08/14/2013, 10:48 AM

## 2013-08-14 NOTE — Progress Notes (Signed)
[facility] PROGRESS NOTE  Mark [name] ZOX:[id number] DOB: October 07, 1945 DOA: 08/11/2013 PCP: Leo Grosser, MD  Assessment/Plan: 1. Acute respiratory failure: sec to COPD exacerbation and CAP: - Admitted to telemetry.  - on IV steroids, IV rocpehin and IV zithromax, bronchodilators and oxygen as needed to keep sats >90%. His breathing has improved a little. His breathing has improved and his wheezing has improved, will start tapering steroids.   2. Atrial fibrillation with RVR: better rate controlled on oral cardizem. - On atenolol.  - echocardiogram done and results The cavity size was normal. Wall thickness was normal. Systolic function was normal. The estimated ejection fraction was in the range of 55% to 60%. Wall motion was normal; there were no regional wall motion abnormalities.  - cardiology consulted and recommended anticoagulation, he will probably need cardioversion.  - His rate between 95 to 115.  - 12 lead EKG shows afib with RVR in 130's  3. Hypertension - controlled.   4. Stage 3 CKD:  - At baseline.   5. Anemia: mild.  - continue to monitor.   4. DVT prophylaxis.   5. Hyperkalemia: stopped arb , one dose of kayexalate ordered.  Code Status: full code.  Family Communication: none at bedside Disposition Plan: pending  Consultants: Cardiology consulted.  Procedures:  echocardiogram  Antibiotics:  rocpehin   zithromax  HPI/Subjective: No new complaints.   Objective: Filed Vitals:   08/14/13 1324  BP: 129/93  Pulse: 106  Temp: 97.5 F (36.4 C)  Resp: 18    Intake/Output Summary (Last 24 hours) at 08/14/13 1352 Last data filed at 08/14/13 1325  Gross per 24 hour  Intake    834 ml  Output   1425 ml  Net   -591 ml   Filed Weights   08/11/13 1718  Weight: 66.225 kg (146 lb)    Exam:   General:  Alert afebrile comfortable  Cardiovascular: s1s2 tachycardic  Respiratory: ctab,  wheezing posteriorly  resolved.  Abdomen: soft NT ND BS+  Musculoskeletal: no pedal edema  Data Reviewed: Basic Metabolic Panel:  Recent Labs Lab 08/11/13 1735 08/12/13 1230 08/14/13 0610 08/14/13 1115  NA 136 137 136 134*  K 4.8 4.7 5.8* 5.4*  CL 99 103 102 102  CO2 19 21 21 21   GLUCOSE 184* 150* 151* 198*  BUN 32* 36* 57* 58*  CREATININE 2.46* 2.26* 2.48* 2.41*  CALCIUM 9.2 9.0 8.9 8.9   Liver Function Tests:  Recent Labs Lab 08/14/13 0610  AST 14  ALT 21  ALKPHOS 62  BILITOT 0.2*  PROT 6.4  ALBUMIN 3.1*   No results found for this basename: LIPASE, AMYLASE,  in the last 168 hours No results found for this basename: AMMONIA,  in the last 168 hours CBC:  Recent Labs Lab 08/11/13 1735 08/12/13 1230 08/14/13 0610  WBC 7.8 10.1 12.6*  NEUTROABS 6.9  --   --   HGB 13.3 11.9* 11.7*  HCT 40.4 34.3* 34.7*  MCV 94.4 93.7 95.6  PLT 109* 112* 175   Cardiac Enzymes:  Recent Labs Lab 08/12/13 1740 08/12/13 2150 08/13/13 0352  TROPONINI <0.30 <0.30 <0.30   BNP (last 3 results) No results found for this basename: PROBNP,  in the last 8760 hours CBG: No results found for this basename: GLUCAP,  in the last 168 hours  Recent Results (from the past 240 hour(s))  CULTURE, BLOOD (ROUTINE X 2)     Status: None   Collection Time    08/11/13  6:28  PM      Result Value Range Status   Specimen Description BLOOD LEFT ANTECUBITAL   Final   Special Requests BOTTLES DRAWN AEROBIC ONLY   Final   Culture  Setup Time     Final   Value: 08/12/2013 08:59     Performed at [facility]   Culture     Final   Value:        BLOOD CULTURE RECEIVED NO GROWTH TO DATE CULTURE WILL BE HELD FOR 5 DAYS BEFORE ISSUING A FINAL NEGATIVE REPORT     Performed at [facility]   Report Status PENDING   Incomplete     Studies: No results found.  Scheduled Meds: . atenolol  50 mg Oral Daily  . atorvastatin  40 mg Oral Daily  . azithromycin  500 mg Intravenous Q24H  . cefTRIAXone  (ROCEPHIN)  IV  1 g Intravenous Q24H  . diltiazem  90 mg Oral Q6H  . docusate sodium  100 mg Oral BID  . methylPREDNISolone (SOLU-MEDROL) injection  40 mg Intravenous Q6H  . pantoprazole  80 mg Oral Daily  . sodium chloride  3 mL Intravenous Q12H  . warfarin   Does not apply Once  . Warfarin - Pharmacist Dosing Inpatient   Does not apply q1800   Continuous Infusions: . heparin 1,050 Units/hr (08/14/13 0830)    Principal Problem:   COPD with exacerbation Active Problems:   AAA (abdominal aortic aneurysm)   HTN (hypertension)   HLD (hyperlipidemia)   Chronic kidney disease   CAP (community acquired pneumonia)   Tobacco abuse   COPD exacerbation   Atrial fibrillation    Time spent: 25 min    Mark [name]  [facility] Pager [phone] If 7PM-7AM, please contact night-coverage at [email or web address], password [email or web address] 08/14/2013, 1:52 PM  LOS: 3 days

## 2013-08-14 NOTE — Progress Notes (Addendum)
ANTICOAGULATION CONSULT NOTE - Follow Up Consult  Pharmacy Consult for Heparin  Indication: atrial fibrillation  Allergies  Allergen Reactions  . Niaspan [Niacin Er] Anaphylaxis   Patient Measurements: Height: 5\' 6"  (167.6 cm) Weight: 146 lb (66.225 kg) IBW/kg (Calculated) : 63.8  Vital Signs: Temp: 98 F (36.7 C) (11/27 1943) Temp src: Oral (11/27 1943) BP: 109/77 mmHg (11/28 0046) Pulse Rate: 100 (11/27 1943)  Labs:  Recent Labs  08/11/13 1735 08/12/13 1230 08/12/13 1740 08/12/13 2150 08/13/13 0352 08/13/13 1431 08/14/13 0050  HGB 13.3 11.9*  --   --   --   --   --   HCT 40.4 34.3*  --   --   --   --   --   PLT 109* 112*  --   --   --   --   --   LABPROT  --   --   --   --   --  12.5  --   INR  --   --   --   --   --  0.95  --   HEPARINUNFRC  --   --   --   --   --   --  0.37  CREATININE 2.46* 2.26*  --   --   --   --   --   TROPONINI  --   --  <0.30 <0.30 <0.30  --   --    Estimated Creatinine Clearance: 28.2 ml/min (by C-G formula based on Cr of 2.26).  Medications:  Heparin 950 units/hr  Assessment: 67 y/o M on heparin for afib (as well as warfarin per previous note). First HL is 0.37. Other labs as above.   Goal of Therapy:  Heparin level 0.3-0.7 units/ml Monitor platelets by anticoagulation protocol: Yes   Plan:  -Continue heparin at 950 units/hr -F/U HL with AM labs -Daily CBC/HL -Warfarin per previous note -Monitor for bleeding  Thank you for allowing me to take part in this patient's care,  Abran Duke, PharmD Clinical Pharmacist Phone: [phone] Pager: [phone] 08/14/2013 1:57 AM  Addendum 7:21 AM AM HL is 0.26, will increase heparin to 1050 units/hr, check HL at 1530 [place], PharmD

## 2013-08-14 NOTE — Progress Notes (Signed)
ANTICOAGULATION CONSULT NOTE - Follow Up Consult  Pharmacy Consult for Heparin/Coumadin Indication: atrial fibrillation  Allergies  Allergen Reactions  . Niaspan [Niacin Er] Anaphylaxis    Patient Measurements: Height: 5\' 6"  (167.6 cm) Weight: 146 lb (66.225 kg) IBW/kg (Calculated) : 63.8  Vital Signs: Temp: 98.3 F (36.8 C) (11/28 2030) Temp src: Oral (11/28 2030) BP: 123/92 mmHg (11/28 2030) Pulse Rate: 88 (11/28 2030)  Labs:  Recent Labs  08/12/13 1230 08/12/13 1740 08/12/13 2150 08/13/13 0352 08/13/13 1431 08/14/13 0050 08/14/13 0610 08/14/13 1115 08/14/13 1940  HGB 11.9*  --   --   --   --   --  11.7*  --   --   HCT 34.3*  --   --   --   --   --  34.7*  --   --   PLT 112*  --   --   --   --   --  175  --   --   LABPROT  --   --   --   --  12.5  --  13.7  --   --   INR  --   --   --   --  0.95  --  1.07  --   --   HEPARINUNFRC  --   --   --   --   --  0.37 0.26*  --  0.93*  CREATININE 2.26*  --   --   --   --   --  2.48* 2.41*  --   TROPONINI  --  <0.30 <0.30 <0.30  --   --   --   --   --     Estimated Creatinine Clearance: 26.5 ml/min (by C-G formula based on Cr of 2.41).   Medications:  Infusions:  . heparin 1,050 Units/hr (08/14/13 0830)    Assessment: 67 year old male admitted with pneumonia, COPD exacerbation, and found to have new-onset atrial fibrillation.  He is receiving Heparin bridging to Coumadin.  His heparin level is now supratherapeutic after slight rate increase.  Perhaps with patient's renal insufficiency, he had a delay to steady state or is now accumulating heparin.  Either way, will need a reduction in rate back to 950 units/hr.    Note repeat K=5.4, losartan was discontinued and Kayexalate ordered.  Goal of Therapy:  INR 2-3 Heparin level 0.3-0.7 units/ml Monitor platelets by anticoagulation protocol: Yes   Plan:  Reduce heparin to 950 units/hr. Follow-up with heparin level in AM.  Dixie Dials, Pharm.D., BCPS Clinical  Pharmacist Pager [phone] 08/14/2013 9:51 PM

## 2013-08-15 LAB — BASIC METABOLIC PANEL
CO2: 20 mEq/L (ref 19–32)
Calcium: 8.6 mg/dL (ref 8.4–10.5)
Chloride: 102 mEq/L (ref 96–112)
Creatinine, Ser: 2.29 mg/dL — ABNORMAL HIGH (ref 0.50–1.35)
GFR calc non Af Amer: 28 mL/min — ABNORMAL LOW (ref >90)

## 2013-08-15 LAB — CBC
HCT: 36.5 % — ABNORMAL LOW (ref 39.0–52.0)
Hemoglobin: 12.6 g/dL — ABNORMAL LOW (ref 13.0–17.0)
MCHC: 34.5 g/dL (ref 30.0–36.0)
MCV: 94.1 fL (ref 78.0–100.0)
Platelets: 182 10*3/uL (ref 150–400)
RBC: 3.88 MIL/uL — ABNORMAL LOW (ref 4.22–5.81)
WBC: 10.3 10*3/uL (ref 4.0–10.5)

## 2013-08-15 LAB — PROTIME-INR: Prothrombin Time: 18.4 seconds — ABNORMAL HIGH (ref 11.6–15.2)

## 2013-08-15 MED ORDER — GI COCKTAIL ~~LOC~~
30.0000 mL | Freq: Once | ORAL | Status: AC
  Administered 2013-08-15: 30 mL via ORAL
  Filled 2013-08-15: qty 30

## 2013-08-15 MED ORDER — PANTOPRAZOLE SODIUM 40 MG IV SOLR
40.0000 mg | INTRAVENOUS | Status: DC
  Administered 2013-08-15 – 2013-08-16 (×2): 40 mg via INTRAVENOUS
  Filled 2013-08-15 (×3): qty 40

## 2013-08-15 MED ORDER — METOPROLOL SUCCINATE ER 50 MG PO TB24
50.0000 mg | ORAL_TABLET | Freq: Every day | ORAL | Status: DC
  Administered 2013-08-15: 50 mg via ORAL
  Filled 2013-08-15 (×2): qty 1

## 2013-08-15 MED ORDER — WARFARIN SODIUM 5 MG PO TABS
5.0000 mg | ORAL_TABLET | Freq: Once | ORAL | Status: AC
  Administered 2013-08-15: 5 mg via ORAL
  Filled 2013-08-15: qty 1

## 2013-08-15 MED ORDER — DILTIAZEM HCL ER COATED BEADS 360 MG PO CP24
360.0000 mg | ORAL_CAPSULE | Freq: Every day | ORAL | Status: DC
  Administered 2013-08-15 – 2013-08-17 (×3): 360 mg via ORAL
  Filled 2013-08-15 (×3): qty 1

## 2013-08-15 NOTE — Evaluation (Signed)
Physical Therapy Evaluation Patient Details Name: Mark [name] MRN: [id number] DOB: Dec 23, 1945 Today's Date: 08/15/2013 Time: 4098-1191 PT Time Calculation (min): 15 min  PT Assessment / Plan / Recommendation History of Present Illness  Pt adm with acute respiratory failure due to COPD exacerbation and PNA.  Pt also with A-fib with RVR.  Clinical Impression  Pt mobilizing well. Okay for dc home from PT standpoint.  Pt with decr SaO2 amb on RA(See additional note) Discussed energy conservation at home and pacing himself to build up endurance.    PT Assessment  Patent does not need any further PT services    Follow Up Recommendations  No PT follow up    Does the patient have the potential to tolerate intense rehabilitation      Barriers to Discharge        Equipment Recommendations  None recommended by PT    Recommendations for Other Services     Frequency      Precautions / Restrictions Precautions Precautions: None   Pertinent Vitals/Pain See flow sheet.      Mobility  Bed Mobility Bed Mobility: Supine to Sit;Sitting - Scoot to Edge of Bed Supine to Sit: 7: Independent Sitting - Scoot to Edge of Bed: 7: Independent Transfers Transfers: Sit to Stand;Stand to Sit Sit to Stand: 6: Modified independent (Device/Increase time);With upper extremity assist;From bed Stand to Sit: 6: Modified independent (Device/Increase time);With upper extremity assist;To bed Ambulation/Gait Ambulation/Gait Assistance: 6: Modified independent (Device/Increase time) Ambulation Distance (Feet): 150 Feet Assistive device: None Gait Pattern: Step-through pattern;Decreased stride length    Exercises     PT Diagnosis:    PT Problem List:   PT Treatment Interventions:       PT Goals(Current goals can be found in the care plan section) Acute Rehab PT Goals PT Goal Formulation: No goals set, d/c therapy  Visit Information  Last PT Received On: 08/15/13 Assistance Needed:  +1 History of Present Illness: Pt adm with acute respiratory failure due to COPD exacerbation and PNA.  Pt also with A-fib with RVR.       Prior Functioning  Home Living Family/patient expects to be discharged to:: Private residence Living Arrangements: Spouse/significant other Available Help at Discharge: Family Type of Home: House Home Access: Stairs to enter Secretary/administrator of Steps: 4-5 Entrance Stairs-Rails: Right;Left Home Layout: One level Home Equipment: Shower seat - built in;Cane - single point;Electric scooter Prior Function Level of Independence: Independent Dominant Hand: Left    Cognition  Cognition Arousal/Alertness: Awake/alert Behavior During Therapy: WFL for tasks assessed/performed Overall Cognitive Status: Within Functional Limits for tasks assessed    Extremity/Trunk Assessment Upper Extremity Assessment Upper Extremity Assessment: Overall WFL for tasks assessed Lower Extremity Assessment Lower Extremity Assessment: Overall WFL for tasks assessed   Balance Balance Balance Assessed: Yes Static Standing Balance Static Standing - Balance Support: No upper extremity supported Static Standing - Level of Assistance: 7: Independent  End of Session PT - End of Session Activity Tolerance: Patient tolerated treatment well Patient left: in bed;with call bell/phone within reach Nurse Communication: Mobility status;Other (comment) (O2 sats)  GP     Phylliss Strege 08/15/2013, 3:37 PM  [facility] PT [phone]

## 2013-08-15 NOTE — Progress Notes (Signed)
ANTICOAGULATION CONSULT NOTE Pharmacy Consult for Heparin/Coumadin Indication: atrial fibrillation  Allergies  Allergen Reactions  . Niaspan [Niacin Er] Anaphylaxis    Patient Measurements: Height: 5\' 6"  (167.6 cm) Weight: 151 lb 9.6 oz (68.765 kg) IBW/kg (Calculated) : 63.8  Vital Signs: Temp: 97.7 F (36.5 C) (11/29 0424) Temp src: Oral (11/29 0424) BP: 122/75 mmHg (11/29 0424) Pulse Rate: 103 (11/29 0424)  Labs:  Recent Labs  08/12/13 1230 08/12/13 1740 08/12/13 2150 08/13/13 0352 08/13/13 1431  08/14/13 0610 08/14/13 1115 08/14/13 1940 08/15/13 0535  HGB 11.9*  --   --   --   --   --  11.7*  --   --  12.6*  HCT 34.3*  --   --   --   --   --  34.7*  --   --  36.5*  PLT 112*  --   --   --   --   --  175  --   --  182  LABPROT  --   --   --   --  12.5  --  13.7  --   --  18.4*  INR  --   --   --   --  0.95  --  1.07  --   --  1.58*  HEPARINUNFRC  --   --   --   --   --   < > 0.26*  --  0.93* 0.80*  CREATININE 2.26*  --   --   --   --   --  2.48* 2.41*  --  2.29*  TROPONINI  --  <0.30 <0.30 <0.30  --   --   --   --   --   --   < > = values in this interval not displayed.  Estimated Creatinine Clearance: 27.9 ml/min (by C-G formula based on Cr of 2.29).  Assessment: 67 year old male with new-onset atrial fibrillation for anticoagulation  Goal of Therapy:  INR 2-3 Heparin level 0.3-0.7 units/ml Monitor platelets by anticoagulation protocol: Yes   Plan:  Decrease heparin 800 units/hr Coumadin 5 mg today  Geannie Risen, PharmD, BCPS  08/15/2013 7:43 AM

## 2013-08-15 NOTE — Progress Notes (Signed)
PT Note  SATURATION QUALIFICATIONS: (This note is used to comply with regulatory documentation for home oxygen)  Patient Saturations on Room Air at Rest = 92%  Patient Saturations on Room Air while Ambulating = 82%  Patient Saturations on 2 Liters of oxygen after Ambulating = 89%  Please briefly explain why patient needs home oxygen: Decreased activity tolerance due to low SaO2 while on RA.  [employer] PT [phone]

## 2013-08-15 NOTE — Progress Notes (Signed)
Subjective: Dyspnea and productive cough improving; chest "burning" with swallowing Objective: Filed Vitals:   08/14/13 0548 08/14/13 1324 08/14/13 2030 08/15/13 0424  BP: 112/77 129/93 123/92 122/75  Pulse: 107 106 88 103  Temp: 98.1 F (36.7 C) 97.5 F (36.4 C) 98.3 F (36.8 C) 97.7 F (36.5 C)  TempSrc: Oral Oral Oral Oral  Resp:  18 18 20   Height:      Weight:    151 lb 9.6 oz (68.765 kg)  SpO2: 94% 95% 97% 99%   Weight change:   Intake/Output Summary (Last 24 hours) at 08/15/13 0734 Last data filed at 08/15/13 4696  Gross per 24 hour  Intake   1618 ml  Output   1400 ml  Net    218 ml    General: Alert, awake, oriented x3, in no acute distress Neck:  supple Heart: irregular Lungs: diminished BS throughout Abd: soft, not tender Exemities:  No edema.   Neuro: Grossly intact, nonfocal.  Tele:  Afib 100s Lab Results: Results for orders placed during the hospital encounter of 08/11/13 (from the past 24 hour(s))  BASIC METABOLIC PANEL     Status: Abnormal   Collection Time    08/14/13 11:15 AM      Result Value Range   Sodium 134 (*) 135 - 145 mEq/L   Potassium 5.4 (*) 3.5 - 5.1 mEq/L   Chloride 102  96 - 112 mEq/L   CO2 21  19 - 32 mEq/L   Glucose, Bld 198 (*) 70 - 99 mg/dL   BUN 58 (*) 6 - 23 mg/dL   Creatinine, Ser 2.95 (*) 0.50 - 1.35 mg/dL   Calcium 8.9  8.4 - 28.4 mg/dL   GFR calc non Af Amer 26 (*) >90 mL/min   GFR calc Af Amer 30 (*) >90 mL/min  HEPARIN LEVEL (UNFRACTIONATED)     Status: Abnormal   Collection Time    08/14/13  7:40 PM      Result Value Range   Heparin Unfractionated 0.93 (*) 0.30 - 0.70 IU/mL  PROTIME-INR     Status: Abnormal   Collection Time    08/15/13  5:35 AM      Result Value Range   Prothrombin Time 18.4 (*) 11.6 - 15.2 seconds   INR 1.58 (*) 0.00 - 1.49  HEPARIN LEVEL (UNFRACTIONATED)     Status: Abnormal   Collection Time    08/15/13  5:35 AM      Result Value Range   Heparin Unfractionated 0.80 (*) 0.30 - 0.70 IU/mL   CBC     Status: Abnormal   Collection Time    08/15/13  5:35 AM      Result Value Range   WBC 10.3  4.0 - 10.5 K/uL   RBC 3.88 (*) 4.22 - 5.81 MIL/uL   Hemoglobin 12.6 (*) 13.0 - 17.0 g/dL   HCT 13.2 (*) 44.0 - 10.2 %   MCV 94.1  78.0 - 100.0 fL   MCH 32.5  26.0 - 34.0 pg   MCHC 34.5  30.0 - 36.0 g/dL   RDW 72.5  36.6 - 44.0 %   Platelets 182  150 - 400 K/uL    Studies/Results: @RISRSLT24 @  Medications: Reviewed   A/P  1.  Atrial fib Rates are high normal.  Exacerbation felt secondary to COPD. Change cardizem to 360 mg daily; DC atenolol given renal insuff and begin toprol. Plan is to rate control for a few weeks.  Optimize pulmonary status.  DCCV once  COPD and pneumonia improve if atrial fibrillation persists.  2.  COPD  Severe  3.  Hyperkalemia FU BMET pending; ARB DCed.  4.  HTN  Follow with changes.    LOS: 4 days   [name] 08/15/2013, 7:34 AM

## 2013-08-15 NOTE — Progress Notes (Signed)
[facility] PROGRESS NOTE  KAZI REPPOND MWU:[id number] DOB: 02-Mar-1946 DOA: 08/11/2013 PCP: Leo Grosser, MD  Assessment/Plan: 1. Acute respiratory failure: sec to COPD exacerbation and CAP: - Admitted to telemetry.  - on IV steroids, IV rocpehin and IV zithromax, bronchodilators and oxygen as needed to keep sats >90%. His breathing has improved a little. His breathing has improved and his wheezing has improved, will start tapering steroids.   2. Atrial fibrillation with RVR: better rate controlled on oral cardizem. - On atenolol.  - echocardiogram done and results The cavity size was normal. Wall thickness was normal. Systolic function was normal. The estimated ejection fraction was in the range of 55% to 60%. Wall motion was normal; there were no regional wall motion abnormalities.  - cardiology consulted and recommended anticoagulation, he will probably need cardioversion.  - His rate between 95 to 115.  - 12 lead EKG shows afib with RVR in 130's  3. Hypertension - controlled.   4. Stage 3 CKD:  - At baseline.   5. Anemia: mild.  - continue to monitor.   4. DVT prophylaxis.   5. Hyperkalemia: stopped arb , one dose of kayexalate ordered. RESOLVED.   6. GERD;  - appears to be severe.  - he was on 80 mg of protonix and he reports burning sensation sub sternaly . Ordered one dose of gi cocktail.  - will request GI consult in am to see if needs EGD, for further evaluation.   Code Status: full code.  Family Communication: none at bedside Disposition Plan: pending  Consultants: Cardiology consulted.  Procedures:  echocardiogram  Antibiotics:  rocpehin   zithromax  HPI/Subjective: No new complaints.   Objective: Filed Vitals:   08/15/13 1523  BP:   Pulse: 111  Temp:   Resp:     Intake/Output Summary (Last 24 hours) at 08/15/13 1650 Last data filed at 08/15/13 1255  Gross per 24 hour  Intake   1138 ml  Output   1525 ml  Net   -387 ml    Filed Weights   08/11/13 1718 08/15/13 0424  Weight: 66.225 kg (146 lb) 68.765 kg (151 lb 9.6 oz)    Exam:   General:  Alert afebrile comfortable  Cardiovascular: s1s2 tachycardic  Respiratory: ctab,  wheezing posteriorly resolved.  Abdomen: soft NT ND BS+  Musculoskeletal: no pedal edema  Data Reviewed: Basic Metabolic Panel:  Recent Labs Lab 08/11/13 1735 08/12/13 1230 08/14/13 0610 08/14/13 1115 08/15/13 0535  NA 136 137 136 134* 136  K 4.8 4.7 5.8* 5.4* 5.0  CL 99 103 102 102 102  CO2 19 21 21 21 20   GLUCOSE 184* 150* 151* 198* 153*  BUN 32* 36* 57* 58* 56*  CREATININE 2.46* 2.26* 2.48* 2.41* 2.29*  CALCIUM 9.2 9.0 8.9 8.9 8.6   Liver Function Tests:  Recent Labs Lab 08/14/13 0610  AST 14  ALT 21  ALKPHOS 62  BILITOT 0.2*  PROT 6.4  ALBUMIN 3.1*   No results found for this basename: LIPASE, AMYLASE,  in the last 168 hours No results found for this basename: AMMONIA,  in the last 168 hours CBC:  Recent Labs Lab 08/11/13 1735 08/12/13 1230 08/14/13 0610 08/15/13 0535  WBC 7.8 10.1 12.6* 10.3  NEUTROABS 6.9  --   --   --   HGB 13.3 11.9* 11.7* 12.6*  HCT 40.4 34.3* 34.7* 36.5*  MCV 94.4 93.7 95.6 94.1  PLT 109* 112* 175 182   Cardiac Enzymes:  Recent Labs  Lab 08/12/13 1740 08/12/13 2150 08/13/13 0352  TROPONINI <0.30 <0.30 <0.30   BNP (last 3 results) No results found for this basename: PROBNP,  in the last 8760 hours CBG: No results found for this basename: GLUCAP,  in the last 168 hours  Recent Results (from the past 240 hour(s))  CULTURE, BLOOD (ROUTINE X 2)     Status: None   Collection Time    08/11/13  6:28 PM      Result Value Range Status   Specimen Description BLOOD LEFT ANTECUBITAL   Final   Special Requests BOTTLES DRAWN AEROBIC ONLY   Final   Culture  Setup Time     Final   Value: 08/12/2013 08:59     Performed at [facility]   Culture     Final   Value:        BLOOD CULTURE RECEIVED NO GROWTH TO  DATE CULTURE WILL BE HELD FOR 5 DAYS BEFORE ISSUING A FINAL NEGATIVE REPORT     Performed at [facility]   Report Status PENDING   Incomplete     Studies: Dg Chest 2 View  08/14/2013   CLINICAL DATA:  Resolution of pneumonia.  EXAM: CHEST  2 VIEW  COMPARISON:  08/11/2013  FINDINGS: Heart, mediastinal, and hilar contours are stable hand within normal limits. Aeration at the left lung base is improving, but not completely clear. There is a small left basilar patchy opacity. A small left pleural effusion appears larger on today's chest radiograph compared to recent prior. The right lung appears clear. Previously described right middle lobe nodule on prior chest CT is not visible on this chest radiograph. Probable bilateral nipple shadows noted. No acute osseous abnormality.  IMPRESSION: 1. Faint left basilar opacity likely reflects residual airspace disease related to known pneumonia or aspiration. 2. Small left pleural effusion (likely parapneumonic), is increased in size from recent prior study.   Electronically Signed   By: Britta Mccreedy M.D.   On: 08/14/2013 18:24    Scheduled Meds: . atorvastatin  40 mg Oral Daily  . azithromycin  500 mg Intravenous Q24H  . cefTRIAXone (ROCEPHIN)  IV  1 g Intravenous Q24H  . diltiazem  360 mg Oral Daily  . docusate sodium  100 mg Oral BID  . gi cocktail  30 mL Oral Once  . methylPREDNISolone (SOLU-MEDROL) injection  40 mg Intravenous Q12H  . metoprolol succinate  50 mg Oral Daily  . pantoprazole (PROTONIX) IV  40 mg Intravenous Q24H  . sodium chloride  3 mL Intravenous Q12H  . warfarin   Does not apply Once  . Warfarin - Pharmacist Dosing Inpatient   Does not apply q1800   Continuous Infusions: . heparin 800 Units/hr (08/15/13 0753)    Principal Problem:   COPD with exacerbation Active Problems:   AAA (abdominal aortic aneurysm)   HTN (hypertension)   HLD (hyperlipidemia)   Chronic kidney disease   CAP (community acquired pneumonia)    Tobacco abuse   COPD exacerbation   Atrial fibrillation   Hyperkalemia    Time spent: 25 min    Piper Albro  [facility] Pager [phone] If 7PM-7AM, please contact night-coverage at [email or web address], password [facility] 08/15/2013, 4:50 PM  LOS: 4 days

## 2013-08-16 LAB — CBC
MCHC: 34.8 g/dL (ref 30.0–36.0)
MCV: 92.8 fL (ref 78.0–100.0)
Platelets: 184 10*3/uL (ref 150–400)
RBC: 3.9 MIL/uL — ABNORMAL LOW (ref 4.22–5.81)
WBC: 7.9 10*3/uL (ref 4.0–10.5)

## 2013-08-16 LAB — PROTIME-INR: Prothrombin Time: 27.3 seconds — ABNORMAL HIGH (ref 11.6–15.2)

## 2013-08-16 MED ORDER — METOPROLOL SUCCINATE ER 50 MG PO TB24
75.0000 mg | ORAL_TABLET | Freq: Every day | ORAL | Status: DC
  Administered 2013-08-16: 75 mg via ORAL
  Filled 2013-08-16 (×2): qty 1

## 2013-08-16 MED ORDER — PREDNISONE 20 MG PO TABS
40.0000 mg | ORAL_TABLET | Freq: Every day | ORAL | Status: DC
Start: 2013-08-16 — End: 2013-08-17
  Administered 2013-08-16 – 2013-08-17 (×2): 40 mg via ORAL
  Filled 2013-08-16 (×3): qty 2

## 2013-08-16 MED ORDER — WARFARIN SODIUM 1 MG PO TABS
1.0000 mg | ORAL_TABLET | Freq: Once | ORAL | Status: AC
  Administered 2013-08-16: 1 mg via ORAL
  Filled 2013-08-16: qty 1

## 2013-08-16 NOTE — Progress Notes (Signed)
Patient's case was discussed with the hospital team and our office chart was reviewed and he had a colonoscopy by my partner Dr. Madilyn Fireman in 2007 but has been dismissed from [facility] years ago and not seen since then and based on his symptoms I have recommended an upper GI and barium swallow and pending those findings consider ultrasound or endoscopy if symptoms continue but will need to decide if he should followup with a different GI group based on above and call my partner tomorrow Dr. Matthias Hughs if we can be of any further assistance

## 2013-08-16 NOTE — Progress Notes (Signed)
[facility] PROGRESS NOTE  KEIN CARLBERG EAV:[id number] DOB: [birth year] DOA: 08/11/2013 PCP: Leo Grosser, MD  Assessment/Plan: 1. Acute respiratory failure: sec to COPD exacerbation and CAP: - Admitted to telemetry.  - on IV steroids, IV rocpehin and IV zithromax, bronchodilators and oxygen as needed to keep sats >90%. His breathing has improved , without any wheezing. will start tapering steroids.   2. Atrial fibrillation with RVR: better rate controlled on oral cardizem. - currently on metoprolol.  - echocardiogram done and results The cavity size was normal. Wall thickness was normal. Systolic function was normal. The estimated ejection fraction was in the range of 55% to 60%. Wall motion was normal; there were no regional wall motion abnormalities.  - cardiology consulted and recommended anticoagulation, he will probably need cardioversion.  - His rate between 95 to 115.  - 12 lead EKG shows afib with RVR in 130's - he iso n coumadin with therapeutic INR.   3. Hypertension - controlled.   4. Stage 3 CKD:  - At baseline.   5. Anemia: mild.  - continue to monitor.   4. DVT prophylaxis.   5. Hyperkalemia: stopped arb , one dose of kayexalate ordered. RESOLVED.   6. GERD;  - appears to be severe.  - he was on 80 mg of protonix and he reports burning sensation sub sternaly . Ordered one dose of gi cocktail.  - REQUESTED GI consult, from Dr Ewing Schlein, recommended to get upper GI series and barium swallow in am.   Code Status: full code.  Family Communication: none at bedside Disposition Plan: pending, possibly home.   Consultants: Cardiology consulted.  Procedures:  echocardiogram  Antibiotics:  rocephin   zithromax  HPI/Subjective: No new complaints. Wants to know when he can go home .    Objective: Filed Vitals:   08/16/13 1408  BP: 127/86  Pulse: 66  Temp: 98.2 F (36.8 C)  Resp: 18    Intake/Output Summary (Last 24 hours) at 08/16/13  1635 Last data filed at 08/16/13 1300  Gross per 24 hour  Intake    360 ml  Output   1900 ml  Net  -1540 ml   Filed Weights   08/11/13 1718 08/15/13 0424  Weight: 66.225 kg (146 lb) 68.765 kg (151 lb 9.6 oz)    Exam:   General:  Alert afebrile comfortable  Cardiovascular: s1s2 tachycardic  Respiratory: ctab,  wheezing posteriorly resolved.  Abdomen: soft NT ND BS+  Musculoskeletal: no pedal edema  Data Reviewed: Basic Metabolic Panel:  Recent Labs Lab 08/11/13 1735 08/12/13 1230 08/14/13 0610 08/14/13 1115 08/15/13 0535  NA 136 137 136 134* 136  K 4.8 4.7 5.8* 5.4* 5.0  CL 99 103 102 102 102  CO2 19 21 21 21 20   GLUCOSE 184* 150* 151* 198* 153*  BUN 32* 36* 57* 58* 56*  CREATININE 2.46* 2.26* 2.48* 2.41* 2.29*  CALCIUM 9.2 9.0 8.9 8.9 8.6   Liver Function Tests:  Recent Labs Lab 08/14/13 0610  AST 14  ALT 21  ALKPHOS 62  BILITOT 0.2*  PROT 6.4  ALBUMIN 3.1*   No results found for this basename: LIPASE, AMYLASE,  in the last 168 hours No results found for this basename: AMMONIA,  in the last 168 hours CBC:  Recent Labs Lab 08/11/13 1735 08/12/13 1230 08/14/13 0610 08/15/13 0535 08/16/13 0610  WBC 7.8 10.1 12.6* 10.3 7.9  NEUTROABS 6.9  --   --   --   --   HGB 13.3  11.9* 11.7* 12.6* 12.6*  HCT 40.4 34.3* 34.7* 36.5* 36.2*  MCV 94.4 93.7 95.6 94.1 92.8  PLT 109* 112* 175 182 184   Cardiac Enzymes:  Recent Labs Lab 08/12/13 1740 08/12/13 2150 08/13/13 0352  TROPONINI <0.30 <0.30 <0.30   BNP (last 3 results) No results found for this basename: PROBNP,  in the last 8760 hours CBG: No results found for this basename: GLUCAP,  in the last 168 hours  Recent Results (from the past 240 hour(s))  CULTURE, BLOOD (ROUTINE X 2)     Status: None   Collection Time    08/11/13  6:28 PM      Result Value Range Status   Specimen Description BLOOD LEFT ANTECUBITAL   Final   Special Requests BOTTLES DRAWN AEROBIC ONLY   Final   Culture   Setup Time     Final   Value: 08/12/2013 08:59     Performed at [facility]   Culture     Final   Value:        BLOOD CULTURE RECEIVED NO GROWTH TO DATE CULTURE WILL BE HELD FOR 5 DAYS BEFORE ISSUING A FINAL NEGATIVE REPORT     Performed at [facility]   Report Status PENDING   Incomplete     Studies: Dg Chest 2 View  08/14/2013   CLINICAL DATA:  Resolution of pneumonia.  EXAM: CHEST  2 VIEW  COMPARISON:  08/11/2013  FINDINGS: Heart, mediastinal, and hilar contours are stable hand within normal limits. Aeration at the left lung base is improving, but not completely clear. There is a small left basilar patchy opacity. A small left pleural effusion appears larger on today's chest radiograph compared to recent prior. The right lung appears clear. Previously described right middle lobe nodule on prior chest CT is not visible on this chest radiograph. Probable bilateral nipple shadows noted. No acute osseous abnormality.  IMPRESSION: 1. Faint left basilar opacity likely reflects residual airspace disease related to known pneumonia or aspiration. 2. Small left pleural effusion (likely parapneumonic), is increased in size from recent prior study.   Electronically Signed   By: Britta Mccreedy M.D.   On: 08/14/2013 18:24    Scheduled Meds: . atorvastatin  40 mg Oral Daily  . azithromycin  500 mg Intravenous Q24H  . cefTRIAXone (ROCEPHIN)  IV  1 g Intravenous Q24H  . diltiazem  360 mg Oral Daily  . docusate sodium  100 mg Oral BID  . metoprolol succinate  75 mg Oral Daily  . pantoprazole (PROTONIX) IV  40 mg Intravenous Q24H  . predniSONE  40 mg Oral QAC breakfast  . sodium chloride  3 mL Intravenous Q12H  . warfarin  1 mg Oral ONCE-1800  . warfarin   Does not apply Once  . Warfarin - Pharmacist Dosing Inpatient   Does not apply q1800   Continuous Infusions:    Principal Problem:   COPD with exacerbation Active Problems:   AAA (abdominal aortic aneurysm)   HTN  (hypertension)   HLD (hyperlipidemia)   Chronic kidney disease   CAP (community acquired pneumonia)   Tobacco abuse   COPD exacerbation   Atrial fibrillation   Hyperkalemia    Time spent: 25 min    Mark [name]  [facility] Pager [phone] If 7PM-7AM, please contact night-coverage at [email or web address], password [facility] 08/16/2013, 4:35 PM  LOS: 5 days

## 2013-08-16 NOTE — Progress Notes (Addendum)
Subjective: Dyspnea and productive cough improved; chest "burning" with swallowing Objective: Filed Vitals:   08/15/13 1523 08/15/13 1526 08/15/13 2010 08/16/13 0430  BP:   131/79 132/84  Pulse: 111  101 107  Temp:   98.7 F (37.1 C) 98.7 F (37.1 C)  TempSrc:   Oral Oral  Resp:   17 18  Height:      Weight:      SpO2: 82% 89% 95% 96%   Weight change:   Intake/Output Summary (Last 24 hours) at 08/16/13 1610 Last data filed at 08/16/13 0800  Gross per 24 hour  Intake    360 ml  Output   2425 ml  Net  -2065 ml    General: Alert, awake, oriented x3, in no acute distress Neck:  supple Heart: irregular Lungs: diminished BS throughout Abd: soft, not tender Exemities:  No edema.   Neuro: Grossly intact, nonfocal.  Tele:  Afib 100s Lab Results: Results for orders placed during the hospital encounter of 08/11/13 (from the past 24 hour(s))  PROTIME-INR     Status: Abnormal   Collection Time    08/16/13  6:10 AM      Result Value Range   Prothrombin Time 27.3 (*) 11.6 - 15.2 seconds   INR 2.64 (*) 0.00 - 1.49  HEPARIN LEVEL (UNFRACTIONATED)     Status: None   Collection Time    08/16/13  6:10 AM      Result Value Range   Heparin Unfractionated 0.32  0.30 - 0.70 IU/mL  CBC     Status: Abnormal   Collection Time    08/16/13  6:10 AM      Result Value Range   WBC 7.9  4.0 - 10.5 K/uL   RBC 3.90 (*) 4.22 - 5.81 MIL/uL   Hemoglobin 12.6 (*) 13.0 - 17.0 g/dL   HCT 96.0 (*) 45.4 - 09.8 %   MCV 92.8  78.0 - 100.0 fL   MCH 32.3  26.0 - 34.0 pg   MCHC 34.8  30.0 - 36.0 g/dL   RDW 11.9  14.7 - 82.9 %   Platelets 184  150 - 400 K/uL     Medications: Reviewed   A/P  1.  Atrial fib Rates are high normal.  Exacerbation felt secondary to COPD. Continue cardizem to 360 mg daily; increase toprol to 75 mg daily. Plan is to rate control for a few weeks.  Optimize pulmonary status.  DCCV once COPD and pneumonia improve if atrial fibrillation persists. INR is therapeutic; DC  heparin. FU Dr Patty Sermons 2-4 weeks after DC.  2.  COPD  Severe  3.  HTN  BP improved; continue present meds.   LOS: 5 days   Mark [name] 08/16/2013, 8:28 AM

## 2013-08-16 NOTE — Progress Notes (Signed)
ANTICOAGULATION CONSULT NOTE - Follow Up Consult  Pharmacy Consult for Coumadin Indication: atrial fibrillation  Allergies  Allergen Reactions  . Niaspan [Niacin Er] Anaphylaxis    Patient Measurements: Height: 5\' 6"  (167.6 cm) Weight: 151 lb 9.6 oz (68.765 kg) IBW/kg (Calculated) : 63.8  Vital Signs: Temp: 98.7 F (37.1 C) (11/30 0430) Temp src: Oral (11/30 0430) BP: 132/84 mmHg (11/30 0430) Pulse Rate: 107 (11/30 0430)  Labs:  Recent Labs  08/14/13 0610 08/14/13 1115 08/14/13 1940 08/15/13 0535 08/16/13 0610  HGB 11.7*  --   --  12.6* 12.6*  HCT 34.7*  --   --  36.5* 36.2*  PLT 175  --   --  182 184  LABPROT 13.7  --   --  18.4* 27.3*  INR 1.07  --   --  1.58* 2.64*  HEPARINUNFRC 0.26*  --  0.93* 0.80* 0.32  CREATININE 2.48* 2.41*  --  2.29*  --     Estimated Creatinine Clearance: 27.5 ml/min (by C-G formula based on Cr of 2.29).   Medications:  Infusions:     Assessment: 67 year old male admitted with pneumonia, COPD exacerbation, and found to have new-onset atrial fibrillation.  He is receiving Heparin bridging to Coumadin.  His INR has increased significantly after only 3 doses of Coumadin, and his Heparin was discontinued this morning.  Goal of Therapy:  INR 2-3   Plan:  Coumadin 1mg  today Daily PT/INR  Estella Husk, Pharm.D., BCPS, AAHIVP Clinical Pharmacist Phone: [phone] or [phone] 08/16/2013, 12:10 PM

## 2013-08-17 ENCOUNTER — Inpatient Hospital Stay (HOSPITAL_COMMUNITY): Payer: Medicare Other

## 2013-08-17 ENCOUNTER — Other Ambulatory Visit: Payer: Self-pay | Admitting: Family Medicine

## 2013-08-17 LAB — PROTIME-INR: Prothrombin Time: 26.5 seconds — ABNORMAL HIGH (ref 11.6–15.2)

## 2013-08-17 MED ORDER — METOPROLOL SUCCINATE ER 100 MG PO TB24: 100.0000 mg | ORAL_TABLET | Freq: Every day | ORAL | Status: DC

## 2013-08-17 MED ORDER — IPRATROPIUM BROMIDE 0.02 % IN SOLN: 0.5000 mg | Freq: Four times a day (QID) | RESPIRATORY_TRACT | Status: DC

## 2013-08-17 MED ORDER — ALBUTEROL SULFATE (2.5 MG/3ML) 0.083% IN NEBU: 2.5000 mg | INHALATION_SOLUTION | RESPIRATORY_TRACT | Status: DC | PRN

## 2013-08-17 MED ORDER — DILTIAZEM HCL ER COATED BEADS 360 MG PO CP24: 360.0000 mg | ORAL_CAPSULE | Freq: Every day | ORAL | Status: DC

## 2013-08-17 MED ORDER — PREDNISONE 20 MG PO TABS: ORAL_TABLET | ORAL | Status: DC

## 2013-08-17 MED ORDER — WARFARIN SODIUM 3 MG PO TABS
3.0000 mg | ORAL_TABLET | Freq: Once | ORAL | Status: AC
  Administered 2013-08-17: 3 mg via ORAL
  Filled 2013-08-17: qty 1

## 2013-08-17 MED ORDER — LEVOFLOXACIN 750 MG PO TABS: 750.0000 mg | ORAL_TABLET | Freq: Every day | ORAL | Status: DC

## 2013-08-17 MED ORDER — WARFARIN SODIUM 3 MG PO TABS: 3.0000 mg | ORAL_TABLET | Freq: Once | ORAL | Status: DC

## 2013-08-17 MED ORDER — METOPROLOL SUCCINATE ER 100 MG PO TB24
100.0000 mg | ORAL_TABLET | Freq: Every day | ORAL | Status: DC
  Administered 2013-08-17: 100 mg via ORAL
  Filled 2013-08-17: qty 1

## 2013-08-17 MED ORDER — LEVOFLOXACIN 250 MG PO TABS: 250.0000 mg | ORAL_TABLET | Freq: Every day | ORAL | Status: DC

## 2013-08-17 MED ORDER — GI COCKTAIL ~~LOC~~
30.0000 mL | Freq: Once | ORAL | Status: AC
  Administered 2013-08-17: 30 mL via ORAL
  Filled 2013-08-17: qty 30

## 2013-08-17 MED ORDER — OMEPRAZOLE 40 MG PO CPDR: 40.0000 mg | DELAYED_RELEASE_CAPSULE | Freq: Two times a day (BID) | ORAL | Status: DC

## 2013-08-17 NOTE — Progress Notes (Signed)
ANTICOAGULATION CONSULT NOTE - Follow Up Consult  Pharmacy Consult for Coumadin Indication: atrial fibrillation  Allergies  Allergen Reactions  . Niaspan [Niacin Er] Anaphylaxis    Patient Measurements: Height: 5\' 6"  (167.6 cm) Weight: 151 lb 9.6 oz (68.765 kg) IBW/kg (Calculated) : 63.8  Vital Signs: Temp: 98.6 F (37 C) (12/01 0420) Temp src: Oral (12/01 0420) BP: 149/87 mmHg (12/01 0422) Pulse Rate: 114 (12/01 0420)  Labs:  Recent Labs  08/14/13 1115 08/14/13 1940 08/15/13 0535 08/16/13 0610 08/17/13 0530  HGB  --   --  12.6* 12.6*  --   HCT  --   --  36.5* 36.2*  --   PLT  --   --  182 184  --   LABPROT  --   --  18.4* 27.3* 26.5*  INR  --   --  1.58* 2.64* 2.54*  HEPARINUNFRC  --  0.93* 0.80* 0.32  --   CREATININE 2.41*  --  2.29*  --   --     Estimated Creatinine Clearance: 27.5 ml/min (by C-G formula based on Cr of 2.29).   Assessment: 67 year old male admitted with pneumonia, COPD exacerbation, found to have new-onset atrial fibrillation.  Completed heparin bridge, heparin was discontinued on 11/30.  Pt receiving azithromycin for pneumonia, warfarin dose may require further adjustment once azithro course is complete. INR is therapeutic x 2 days.    Goal of Therapy:  INR 2-3   Plan:  Coumadin 3 mg today Daily PT/INR  Agapito Games, PharmD, BCPS Clinical Pharmacist [phone] 08/17/2013, 11:05 AM

## 2013-08-17 NOTE — Progress Notes (Signed)
Nursing Notes Patient given AVS, medication list and paper prescriptions with  Discharge instructions all questions answered. Prescriptions faxed to pts [facility] as requested, patient with Home oxygen tank in room to be discharged with. Will discharge home as ordered. Millie Shorb, Randall An RN

## 2013-08-17 NOTE — Progress Notes (Signed)
The Chaplain visited with the patient and offered emotional and spiritual support today. The patient was in a good mood during the visit and proclaimed his desire to go home when it is his time to be released from the hospital. The patient and his family informed the Chaplain that they would like prayer today and the Chaplain offered prayer for the patient and his family.  Chaplain Bryson Ha Makinsley Schiavi

## 2013-08-17 NOTE — Discharge Summary (Signed)
Physician Discharge Summary  Mark [name]:[id number] DOB: [birth year] DOA: 08/11/2013  PCP: Leo Grosser, MD  Admit date: 08/11/2013 Discharge date: 08/17/2013  Time spent: 30 minutes  Recommendations for Outpatient Follow-up:  1. Follow up with PCP in one week 2. Follow up with cardiology, GI as recommended 3. Follow up with coumadin clinic as recommended.   Discharge Diagnoses:  Principal Problem:   COPD with exacerbation Active Problems:   AAA (abdominal aortic aneurysm)   HTN (hypertension)   HLD (hyperlipidemia)   Chronic kidney disease   CAP (community acquired pneumonia)   Tobacco abuse   COPD exacerbation   Atrial fibrillation   Hyperkalemia   Discharge Condition: improved.   Diet recommendation: low sodium diet  Filed Weights   08/11/13 1718 08/15/13 0424  Weight: 66.225 kg (146 lb) 68.765 kg (151 lb 9.6 oz)    History of present illness:  Mark [name] is an 67 y.o. male with hx of HTN, afib, AAA s/p repair, CKD with baseline Cr of 2.2, presents to the his PCP's office with 3 days hx of increase shortness of breath, wheezing, thick sputum productive cough, and was found to be hypoxic with Oxygen sat in the ?50%. He was sent to the ER. Evalaution in the ER with CXR showed left lung base infiltrate, and a 2.8 mm nodule seen on prior CT. He has normal WBC and HB, and his Cr is elevated at 2.46. He was given neb and oxygen, and felt better. He unfortunately is a smoker, and only quit for 2 days since he started to get sick.      Hospital Course:  1. Acute respiratory failure: sec to COPD exacerbation and CAP: - Admitted to telemetry.  - on IV steroids, IV rocpehin and IV zithromax, bronchodilators and oxygen as needed to keep sats >90%. His breathing has improved , without any wheezing. will start tapering steroids on discharge.   2. Atrial fibrillation with RVR: better rate controlled on oral cardizem.  - currently on metoprolol.  - echocardiogram  done and results The cavity size was normal. Wall thickness was normal. Systolic function was normal. The estimated ejection fraction was in the range of 55% to 60%. Wall motion was normal; there were no regional wall motion abnormalities.  - cardiology consulted and recommended anticoagulation, . - His rate between 95 to 115.   - he iso n coumadin with therapeutic INR.  3. Hypertension  - controlled.  4. Stage 3 CKD:  - At baseline.  5. Anemia: mild.  - continue to monitor.  5. Hyperkalemia: stopped arb , one dose of kayexalate ordered. RESOLVED.  6. GERD;  - appears to be severe.  - he was on 80 mg of protonix and he reports burning sensation sub sternaly . Ordered one dose of gi cocktail.  - REQUESTED GI consult, from Dr Ewing Schlein, recommended to get upper GI series and barium swallow in am. It showed moderate hiatal hernia. Spoke to Dr Matthias Hughs recommended outpatient follow up with Dr Madilyn Fireman . Increase the protonix to twice daily.      Procedures:  Echo  Consultations:  GI  CARDIOLOGY  Discharge Exam: Filed Vitals:   08/17/13 1345  BP: 130/82  Pulse: 115  Temp: 98.2 F (36.8 C)  Resp: 18    General: alert afebrile comfortable Cardiovascular: s1s2 Respiratory: ctab  Discharge Instructions  Discharge Orders   Future Appointments Provider Department Dept Phone   10/16/2013 8:00 AM Donita Brooks, MD [facility]  [phone]   12/29/2013 9:00 AM [facility] [address] [phone]   12/29/2013 9:30 AM Pryor Ochoa, MD [facility] [phone]   Future Orders Complete By Expires   Diet - low sodium heart healthy  As directed    Discharge instructions  As directed    Comments:     Follow up withPCP in one week Follow up with [facility] coumadin clinic tomorrow.  Follow up with [facility] gastroenterology in one week.       Medication List    STOP taking these medications       amLODipine  10 MG tablet  Commonly known as:  NORVASC     atenolol 50 MG tablet  Commonly known as:  TENORMIN     losartan 100 MG tablet  Commonly known as:  COZAAR      TAKE these medications       albuterol (2.5 MG/3ML) 0.083% nebulizer solution  Commonly known as:  PROVENTIL  Take 3 mLs (2.5 mg total) by nebulization every 4 (four) hours as needed for wheezing or shortness of breath.     atorvastatin 40 MG tablet  Commonly known as:  LIPITOR  Take 40 mg by mouth daily.     diltiazem 360 MG 24 hr capsule  Commonly known as:  CARDIZEM CD  Take 1 capsule (360 mg total) by mouth daily.     ipratropium 0.02 % nebulizer solution  Commonly known as:  ATROVENT  Take 2.5 mLs (0.5 mg total) by nebulization 4 (four) times daily.     levofloxacin 250 MG tablet  Commonly known as:  LEVAQUIN  Take 1 tablet (250 mg total) by mouth daily.     metoprolol succinate 100 MG 24 hr tablet  Commonly known as:  TOPROL-XL  Take 1 tablet (100 mg total) by mouth daily. Take with or immediately following a meal.     omeprazole 40 MG capsule  Commonly known as:  PRILOSEC  Take 1 capsule (40 mg total) by mouth 2 (two) times daily.     predniSONE 20 MG tablet  Commonly known as:  DELTASONE  - Prednisone 40 mg daily for 1 days followed by  - Prednisone 30 mg daily for 3 days followed by   - Prednisone 20 mg daily for 3 days followed by   - Prednisone 10 mg daily for 3 days.     warfarin 3 MG tablet  Commonly known as:  COUMADIN  Take 1 tablet (3 mg total) by mouth one time only at 6 PM.       Allergies  Allergen Reactions  . Niaspan [Niacin Er] Anaphylaxis       Follow-up Information   Follow up with [facility] TOM, MD. Schedule an appointment as soon as possible for a visit in 1 week.   Specialty:  Family Medicine   Contact information:   [address] [place] [address] [phone]       Follow up with HAYES,JOHN C, MD. Schedule an appointment as soon as possible for a  visit in 1 week.   Specialty:  Gastroenterology   Contact information:   [address], [address] [place] [address] [phone]       Follow up with [facility]. Call on 08/18/2013.   Specialty:  Cardiology   Contact information:   [address], [address] [place] [address] [phone]      Follow up with  Cassell Clement, MD. Schedule an appointment as soon as possible for a visit in 2 weeks.   Specialty:  Cardiology   Contact information:   [address] [place] [address] [phone]        The results of significant diagnostics from this hospitalization (including imaging, microbiology, ancillary and laboratory) are listed below for reference.    Significant Diagnostic Studies: Dg Chest 2 View  08/14/2013   CLINICAL DATA:  Resolution of pneumonia.  EXAM: CHEST  2 VIEW  COMPARISON:  08/11/2013  FINDINGS: Heart, mediastinal, and hilar contours are stable hand within normal limits. Aeration at the left lung base is improving, but not completely clear. There is a small left basilar patchy opacity. A small left pleural effusion appears larger on today's chest radiograph compared to recent prior. The right lung appears clear. Previously described right middle lobe nodule on prior chest CT is not visible on this chest radiograph. Probable bilateral nipple shadows noted. No acute osseous abnormality.  IMPRESSION: 1. Faint left basilar opacity likely reflects residual airspace disease related to known pneumonia or aspiration. 2. Small left pleural effusion (likely parapneumonic), is increased in size from recent prior study.   Electronically Signed   By: Britta Mccreedy M.D.   On: 08/14/2013 18:24   Dg Chest 2 View  08/11/2013   CLINICAL DATA:  Shortness of breath.  EXAM: CHEST  2 VIEW  COMPARISON:  Chest radiograph 10/16/2012.  FINDINGS: Stable cardiac and mediastinal contours. Minimal heterogeneous opacities left lung base. 8 mm  nodular density within the right lower lung. No definite pleural effusion or pneumothorax Regional skeleton is unremarkable.  IMPRESSION: 1. Heterogeneous opacities left lung base may represent infection in the appropriate clinical setting. Radiographic followup to ensure resolution is recommended. 2. 8 mm nodular density projecting over the right lower lung demonstrated on prior CT 10/10/2012. Follow-up as per prior CT report.   Electronically Signed   By: Annia Belt M.D.   On: 08/11/2013 18:06   Dg Ugi W/high Density W/kub  08/17/2013   CLINICAL DATA:  Dysphagia.  EXAM: UPPER GI SERIES W/HIGH DENSITY W/KUB  TECHNIQUE: After obtaining a scout radiograph a routine upper GI series was performed using thin and high density barium.  COMPARISON:  None.  FLUOROSCOPY TIME:  3 min and 13 seconds  FINDINGS: Initial barium swallows demonstrate normal pharyngeal motion with swallowing. No laryngeal penetration or aspiration. No upper esophageal webs, strictures or diverticuli.  Nonspecific esophageal dysmotility with occasional disruption of the primary peristaltic wave. There is a small to moderate-sized hiatal hernia with thickened gastric folds in the hernia. No obvious ulceration. No mass or obstruction. The 13 mm barium pill did pass into the stomach. GE reflux is demonstrated.  The stomach, duodenum bulb and C-loop are unremarkable. No ulceration or obstruction.  IMPRESSION: 1. Nonspecific esophageal dysmotility. 2. Small to moderate-sized hiatal hernia with thickening gastric folds in the hernia. GE reflux was demonstrated. 3. The stomach, duodenum bulb and C-loop are unremarkable.   Electronically Signed   By: Loralie Champagne M.D.   On: 08/17/2013 15:50    Microbiology: Recent Results (from the past 240 hour(s))  CULTURE, BLOOD (ROUTINE X 2)     Status: None   Collection Time    08/11/13  6:28 PM      Result Value Range Status   Specimen Description BLOOD LEFT ANTECUBITAL   Final   Special Requests  BOTTLES DRAWN AEROBIC ONLY   Final   Culture  Setup Time     Final   Value: 08/12/2013 08:59     Performed at [facility]   Culture     Final   Value:        BLOOD CULTURE RECEIVED NO GROWTH TO DATE CULTURE WILL BE HELD FOR 5 DAYS BEFORE ISSUING A FINAL NEGATIVE REPORT     Performed at [facility]   Report Status PENDING   Incomplete     Labs: Basic Metabolic Panel:  Recent Labs Lab 08/11/13 1735 08/12/13 1230 08/14/13 0610 08/14/13 1115 08/15/13 0535  NA 136 137 136 134* 136  K 4.8 4.7 5.8* 5.4* 5.0  CL 99 103 102 102 102  CO2 19 21 21 21 20   GLUCOSE 184* 150* 151* 198* 153*  BUN 32* 36* 57* 58* 56*  CREATININE 2.46* 2.26* 2.48* 2.41* 2.29*  CALCIUM 9.2 9.0 8.9 8.9 8.6   Liver Function Tests:  Recent Labs Lab 08/14/13 0610  AST 14  ALT 21  ALKPHOS 62  BILITOT 0.2*  PROT 6.4  ALBUMIN 3.1*   No results found for this basename: LIPASE, AMYLASE,  in the last 168 hours No results found for this basename: AMMONIA,  in the last 168 hours CBC:  Recent Labs Lab 08/11/13 1735 08/12/13 1230 08/14/13 0610 08/15/13 0535 08/16/13 0610  WBC 7.8 10.1 12.6* 10.3 7.9  NEUTROABS 6.9  --   --   --   --   HGB 13.3 11.9* 11.7* 12.6* 12.6*  HCT 40.4 34.3* 34.7* 36.5* 36.2*  MCV 94.4 93.7 95.6 94.1 92.8  PLT 109* 112* 175 182 184   Cardiac Enzymes:  Recent Labs Lab 08/12/13 1740 08/12/13 2150 08/13/13 0352  TROPONINI <0.30 <0.30 <0.30   BNP: BNP (last 3 results) No results found for this basename: PROBNP,  in the last 8760 hours CBG: No results found for this basename: GLUCAP,  in the last 168 hours     Signed:  Vonnetta Akey  [facility] 08/17/2013, 4:23 PM

## 2013-08-17 NOTE — Care Management Note (Unsigned)
    Page 1 of 1   08/17/2013     3:49:07 PM   CARE MANAGEMENT NOTE 08/17/2013  Patient:  Mark [name], Mark [name]   Account Number:  [id number]  Date Initiated:  08/17/2013  Documentation initiated by:  Sufyaan Palma  Subjective/Objective Assessment:   PT ADM ON 08/11/13 WITH SOB, HYPOXIA, PNA.  PTA, PT INDEPENDENT, LIVES WITH SPOUSE.     Action/Plan:   WILL FOLLOW FOR DISCHARGE NEEDS AS PT PROGRESSES.   Anticipated DC Date:  08/19/2013   Anticipated DC Plan:  HOME W HOME HEALTH SERVICES      DC Planning Services  CM consult      Choice offered to / List presented to:             Status of service:  In process, will continue to follow Medicare Important Message given?   (If response is "NO", the following Medicare IM given date fields will be blank) Date Medicare IM given:   Date Additional Medicare IM given:    Discharge Disposition:    Per UR Regulation:  Reviewed for med. necessity/level of care/duration of stay  If discussed at Long Length of Stay Meetings, dates discussed:    Comments:  08/17/13 Ludivina Guymon,RN,BSN [phone] PT MAY NEED HOME O2; WILL NEED QUALIFYING O2 SATS DOCUMENTED WITHIN 48H OF DC.  WILL FOLLOW.

## 2013-08-17 NOTE — Progress Notes (Signed)
Patient Name: Mark [name] Date of Encounter: 08/17/2013     Principal Problem:   COPD with exacerbation Active Problems:   AAA (abdominal aortic aneurysm)   HTN (hypertension)   HLD (hyperlipidemia)   Chronic kidney disease   CAP (community acquired pneumonia)   Tobacco abuse   COPD exacerbation   Atrial fibrillation   Hyperkalemia    SUBJECTIVE  No chest pain.  Breathing much improved. Spoke to him about not smoking. He is noncommittal at this time about that.  CURRENT MEDS . atorvastatin  40 mg Oral Daily  . azithromycin  500 mg Intravenous Q24H  . cefTRIAXone (ROCEPHIN)  IV  1 g Intravenous Q24H  . diltiazem  360 mg Oral Daily  . docusate sodium  100 mg Oral BID  . metoprolol succinate  75 mg Oral Daily  . pantoprazole (PROTONIX) IV  40 mg Intravenous Q24H  . predniSONE  40 mg Oral QAC breakfast  . sodium chloride  3 mL Intravenous Q12H  . warfarin   Does not apply Once  . Warfarin - Pharmacist Dosing Inpatient   Does not apply q1800    OBJECTIVE  Filed Vitals:   08/16/13 2006 08/16/13 2007 08/17/13 0420 08/17/13 0422  BP: 145/91 117/84 146/102 149/87  Pulse: 118  114   Temp: 98.3 F (36.8 C)  98.6 F (37 C)   TempSrc: Oral  Oral   Resp: 19  20   Height:      Weight:      SpO2: 96%  97%     Intake/Output Summary (Last 24 hours) at 08/17/13 0843 Last data filed at 08/17/13 0600  Gross per 24 hour  Intake    840 ml  Output    200 ml  Net    640 ml   Filed Weights   08/11/13 1718 08/15/13 0424  Weight: 146 lb (66.225 kg) 151 lb 9.6 oz (68.765 kg)    PHYSICAL EXAM  General: Pleasant, NAD. Neuro: Alert and oriented X 3. Moves all extremities spontaneously. Psych: Normal affect. HEENT:  Normal  Neck: Supple without bruits or JVD. Lungs:  Resp regular and unlabored, Distant breath sounds. Heart: Irregular in atrial fib. Abdomen: Soft, non-tender, non-distended, BS + x 4.  Extremities: No clubbing, cyanosis or edema  bilaterally.  Accessory Clinical Findings  CBC  Recent Labs  08/15/13 0535 08/16/13 0610  WBC 10.3 7.9  HGB 12.6* 12.6*  HCT 36.5* 36.2*  MCV 94.1 92.8  PLT 182 184   Basic Metabolic Panel  Recent Labs  08/14/13 1115 08/15/13 0535  NA 134* 136  K 5.4* 5.0  CL 102 102  CO2 21 20  GLUCOSE 198* 153*  BUN 58* 56*  CREATININE 2.41* 2.29*  CALCIUM 8.9 8.6   Liver Function Tests No results found for this basename: AST, ALT, ALKPHOS, BILITOT, PROT, ALBUMIN,  in the last 72 hours No results found for this basename: LIPASE, AMYLASE,  in the last 72 hours Cardiac Enzymes No results found for this basename: CKTOTAL, CKMB, CKMBINDEX, TROPONINI,  in the last 72 hours BNP No components found with this basename: POCBNP,  D-Dimer No results found for this basename: DDIMER,  in the last 72 hours Hemoglobin A1C No results found for this basename: HGBA1C,  in the last 72 hours Fasting Lipid Panel No results found for this basename: CHOL, HDL, LDLCALC, TRIG, CHOLHDL, LDLDIRECT,  in the last 72 hours Thyroid Function Tests No results found for this basename: TSH, T4TOTAL, FREET3, T3FREE, THYROIDAB,  in the last 72 hours  TELE  Atrial fibrillation with rapid ventricular response.  ECG    Radiology/Studies  Dg Chest 2 View  08/14/2013   CLINICAL DATA:  Resolution of pneumonia.  EXAM: CHEST  2 VIEW  COMPARISON:  08/11/2013  FINDINGS: Heart, mediastinal, and hilar contours are stable hand within normal limits. Aeration at the left lung base is improving, but not completely clear. There is a small left basilar patchy opacity. A small left pleural effusion appears larger on today's chest radiograph compared to recent prior. The right lung appears clear. Previously described right middle lobe nodule on prior chest CT is not visible on this chest radiograph. Probable bilateral nipple shadows noted. No acute osseous abnormality.  IMPRESSION: 1. Faint left basilar opacity likely reflects  residual airspace disease related to known pneumonia or aspiration. 2. Small left pleural effusion (likely parapneumonic), is increased in size from recent prior study.   Electronically Signed   By: Britta Mccreedy M.D.   On: 08/14/2013 18:24   Dg Chest 2 View  08/11/2013   CLINICAL DATA:  Shortness of breath.  EXAM: CHEST  2 VIEW  COMPARISON:  Chest radiograph 10/16/2012.  FINDINGS: Stable cardiac and mediastinal contours. Minimal heterogeneous opacities left lung base. 8 mm nodular density within the right lower lung. No definite pleural effusion or pneumothorax Regional skeleton is unremarkable.  IMPRESSION: 1. Heterogeneous opacities left lung base may represent infection in the appropriate clinical setting. Radiographic followup to ensure resolution is recommended. 2. 8 mm nodular density projecting over the right lower lung demonstrated on prior CT 10/10/2012. Follow-up as per prior CT report.   Electronically Signed   By: Annia Belt M.D.   On: 08/11/2013 18:06    ASSESSMENT AND PLAN 1. Atrial fib Rates are high normal. Exacerbation felt secondary to COPD.  Continue cardizem to 360 mg daily; increase toprol to 100 mg daily.  Plan is to rate control for a few weeks. Optimize pulmonary status. DCCV once COPD and pneumonia improve if atrial fibrillation persists. INR is therapeutic;  FU Dr Patty Sermons 2-4 weeks after DC. Weekly INRs at [address] cardiology office to be sure he stays therapeutic prior to possible DCCV. 2. COPD Severe  3. HTN BP improved; continue present meds.     Signed, Cassell Clement MD

## 2013-08-18 ENCOUNTER — Telehealth: Payer: Self-pay | Admitting: Family Medicine

## 2013-08-18 LAB — CULTURE, BLOOD (ROUTINE X 2)

## 2013-08-18 MED ORDER — ALBUTEROL SULFATE HFA 108 (90 BASE) MCG/ACT IN AERS: 2.0000 | INHALATION_SPRAY | Freq: Four times a day (QID) | RESPIRATORY_TRACT | Status: DC | PRN

## 2013-08-18 MED ORDER — IPRATROPIUM BROMIDE HFA 17 MCG/ACT IN AERS: 2.0000 | INHALATION_SPRAY | Freq: Four times a day (QID) | RESPIRATORY_TRACT | Status: DC | PRN

## 2013-08-18 NOTE — Telephone Encounter (Signed)
Pt granddaughter has a question about her grandfathers Asthma medication that was given to pt when he was at hospital and the granddaughter is following up about this medication. I am kinda confused which asthma medication it is   Pharmacy was going to fax over paper to see if he actually needs to continue it or to see if insurance will cover Call back [phone]

## 2013-08-18 NOTE — Telephone Encounter (Signed)
Medication that hospital sent over for nebulizer requires Prior Authorization. Per Dr. Tanya Nones change to inhalers for prn usage for wheezing. Meds called to pharmacy. Mark [name] (pt's grand daughter aware of change and how to take)

## 2013-08-19 ENCOUNTER — Encounter (INDEPENDENT_AMBULATORY_CARE_PROVIDER_SITE_OTHER): Payer: Self-pay

## 2013-08-19 ENCOUNTER — Ambulatory Visit (INDEPENDENT_AMBULATORY_CARE_PROVIDER_SITE_OTHER): Payer: Medicare Other | Admitting: *Deleted

## 2013-08-19 DIAGNOSIS — I714 Abdominal aortic aneurysm, without rupture: Secondary | ICD-10-CM

## 2013-08-19 DIAGNOSIS — Z7901 Long term (current) use of anticoagulants: Secondary | ICD-10-CM

## 2013-08-19 DIAGNOSIS — I4891 Unspecified atrial fibrillation: Secondary | ICD-10-CM

## 2013-08-19 NOTE — Patient Instructions (Signed)

## 2013-08-21 ENCOUNTER — Ambulatory Visit (INDEPENDENT_AMBULATORY_CARE_PROVIDER_SITE_OTHER): Payer: Medicare Other | Admitting: Family Medicine

## 2013-08-21 ENCOUNTER — Encounter: Payer: Self-pay | Admitting: Family Medicine

## 2013-08-21 VITALS — BP 130/86 | HR 100 | Temp 96.5°F | Resp 18 | Wt 159.0 lb

## 2013-08-21 DIAGNOSIS — J441 Chronic obstructive pulmonary disease with (acute) exacerbation: Secondary | ICD-10-CM

## 2013-08-21 DIAGNOSIS — Z09 Encounter for follow-up examination after completed treatment for conditions other than malignant neoplasm: Secondary | ICD-10-CM

## 2013-08-21 DIAGNOSIS — I4891 Unspecified atrial fibrillation: Secondary | ICD-10-CM

## 2013-08-21 NOTE — Progress Notes (Signed)
Subjective:    Patient ID: Mark [name], male    DOB: 1946-08-07, 67 y.o.   MRN: [id number]  HPI Patient was recently admitted to the hospital for COPD exacerbation as well as community-acquired pneumonia. He was treated with IV antibiotics, IV steroids, and nebulizer treatments. However in the hospital he developed a fibrillation with rapid ventricular response. He was eventually managed with diltiazem, metoprolol, and anticoagulated on Coumadin. His telemetry on Coumadin 2 mg a day. His most recent INR was checked 2 days ago was found to be therapeutic at 2.2. He has completed his antibiotics. He is slowly tapering off prednisone. He continues to wear oxygen. He is 96% on room air today on oxygen 96% on room air today off oxygen. He continues to have distant breath sounds and right basilar crackles. Overall he feels well. His heart rate is ranging 80-95 today.  He denies any syncope, presyncope, chest pain.  He continues to have some dyspnea on exertion. He is only had to use the albuterol inhaler 2 times since his discharge from hospital. Past Medical History  Diagnosis Date  . GERD (gastroesophageal reflux disease)   . Hyperlipidemia   . Atrial fibrillation     stress test 10/09/12  . H/O hiatal hernia   . Arthritis   . Hypertension     sees Dr. Lynnea Ferrier  . Chronic kidney disease    Past Surgical History  Procedure Laterality Date  . Knee surgery      right  . Abdominal aortic aneurysm repair  10/15/2012    Procedure: ANEURYSM ABDOMINAL AORTIC REPAIR;  Surgeon: Pryor Ochoa, MD;  Location: [facility] OR;  Service: Vascular;  Laterality: N/A;  Resection and Grafting of Abdominal Aortic Aneurysm - Aortobi-Iliac   . Abdominal aortic aneurysm repair     Current Outpatient Prescriptions on File Prior to Visit  Medication Sig Dispense Refill  . albuterol (PROVENTIL HFA;VENTOLIN HFA) 108 (90 BASE) MCG/ACT inhaler Inhale 2 puffs into the lungs every 6 (six) hours as needed for wheezing or  shortness of breath.  1 Inhaler  2  . amLODipine (NORVASC) 10 MG tablet TAKE 1 TABLET BY MOUTH AT BEDTIME  30 tablet  5  . atorvastatin (LIPITOR) 40 MG tablet TAKE 1 TABLET AT BEDTIME.STOP TAKING SIMVASTATIN  30 tablet  3  . diltiazem (CARDIZEM CD) 360 MG 24 hr capsule Take 1 capsule (360 mg total) by mouth daily.  30 capsule  1  . ipratropium (ATROVENT HFA) 17 MCG/ACT inhaler Inhale 2 puffs into the lungs every 6 (six) hours as needed for wheezing.  1 Inhaler  12  . losartan (COZAAR) 100 MG tablet TAKE 1 TABLET EVERY DAY (DOSAGE INCREASED)  30 tablet  3  . metoprolol succinate (TOPROL-XL) 100 MG 24 hr tablet Take 1 tablet (100 mg total) by mouth daily. Take with or immediately following a meal.  30 tablet  1  . omeprazole (PRILOSEC) 40 MG capsule Take 1 capsule (40 mg total) by mouth 2 (two) times daily.  60 capsule  1  . predniSONE (DELTASONE) 20 MG tablet Prednisone 40 mg daily for 1 days followed by Prednisone 30 mg daily for 3 days followed by  Prednisone 20 mg daily for 3 days followed by  Prednisone 10 mg daily for 3 days.  12 tablet  0  . warfarin (COUMADIN) 3 MG tablet Take 1 tablet (3 mg total) by mouth one time only at 6 PM.  15 tablet  1   No current  facility-administered medications on file prior to visit.   Allergies  Allergen Reactions  . Niaspan [Niacin Er] Anaphylaxis   History   Social History  . Marital Status: Married    Spouse Name: N/A    Number of Children: N/A  . Years of Education: N/A   Occupational History  . Not on file.   Social History Main Topics  . Smoking status: Current Every Day Smoker -- 0.30 packs/day for 50 years    Types: Cigarettes  . Smokeless tobacco: Never Used     Comment: pt states that he does not smoke over 5 cigs per day  . Alcohol Use: No  . Drug Use: No  . Sexual Activity: Not on file   Other Topics Concern  . Not on file   Social History Narrative  . No narrative on file      Review of Systems  All other systems  reviewed and are negative.       Objective:   Physical Exam  Vitals reviewed. Cardiovascular: Normal rate and normal heart sounds.  An irregularly irregular rhythm present.  No murmur heard. Pulmonary/Chest: Effort normal. He has decreased breath sounds. He has no wheezes. He has rhonchi in the right lower field. He has no rales.  Abdominal: Soft. Bowel sounds are normal. He exhibits no distension. There is no tenderness. There is no rebound.  Musculoskeletal: He exhibits no edema.          Assessment & Plan:  1. Hospital discharge follow-up She is no longer smoking. I did discuss Chantix with him but he does not really need it at the present time. I recommended continuing oxygen until his follow up visit in 2 weeks. At that time, I hope we can discontinue his oxygen. Also like to recheck his lungs in 2 weeks to make sure the pneumonia has clinically improved.  2. COPD exacerbation Continue to wean off the prednisone. Use albuterol 2 puffs inhaled every 6 hours as needed for wheezing. If he does not require the albuterol often, I would not add Symbicort, another long acting bronchodilator.  I will give the patient Prevnar 13 once he is off prednisone.  3. Atrial fibrillation Currently rate controlled and appropriately anticoagulated. In the future I would be glad to check his Coumadin level here to say the patient tripped in the [place]. We can also discuss possibly switching him to eliquis in the future once he is stable. I would not be surprised if he spontaneously cardioverted back to normal sinus rhythm once his primary issues resolve recheck in 2 weeks.

## 2013-08-26 ENCOUNTER — Ambulatory Visit (INDEPENDENT_AMBULATORY_CARE_PROVIDER_SITE_OTHER): Payer: Medicare Other | Admitting: *Deleted

## 2013-08-26 DIAGNOSIS — I4891 Unspecified atrial fibrillation: Secondary | ICD-10-CM

## 2013-08-26 DIAGNOSIS — Z7901 Long term (current) use of anticoagulants: Secondary | ICD-10-CM

## 2013-08-26 DIAGNOSIS — I714 Abdominal aortic aneurysm, without rupture: Secondary | ICD-10-CM

## 2013-08-26 LAB — POCT INR: INR: 2.6

## 2013-08-26 MED ORDER — WARFARIN SODIUM 3 MG PO TABS: ORAL_TABLET | ORAL | Status: DC

## 2013-09-02 ENCOUNTER — Ambulatory Visit (INDEPENDENT_AMBULATORY_CARE_PROVIDER_SITE_OTHER): Payer: Medicare Other | Admitting: *Deleted

## 2013-09-02 DIAGNOSIS — I4891 Unspecified atrial fibrillation: Secondary | ICD-10-CM

## 2013-09-02 DIAGNOSIS — Z7901 Long term (current) use of anticoagulants: Secondary | ICD-10-CM

## 2013-09-02 DIAGNOSIS — I714 Abdominal aortic aneurysm, without rupture: Secondary | ICD-10-CM

## 2013-09-02 LAB — POCT INR: INR: 2.5

## 2013-09-04 ENCOUNTER — Ambulatory Visit (INDEPENDENT_AMBULATORY_CARE_PROVIDER_SITE_OTHER): Payer: Medicare Other | Admitting: Family Medicine

## 2013-09-04 ENCOUNTER — Encounter: Payer: Self-pay | Admitting: Family Medicine

## 2013-09-04 ENCOUNTER — Other Ambulatory Visit: Payer: Self-pay | Admitting: Family Medicine

## 2013-09-04 VITALS — BP 128/72 | HR 92 | Temp 96.8°F | Resp 20 | Ht 66.5 in | Wt 169.0 lb

## 2013-09-04 DIAGNOSIS — I4891 Unspecified atrial fibrillation: Secondary | ICD-10-CM

## 2013-09-04 DIAGNOSIS — R609 Edema, unspecified: Secondary | ICD-10-CM

## 2013-09-04 MED ORDER — FUROSEMIDE 40 MG PO TABS: 40.0000 mg | ORAL_TABLET | Freq: Every day | ORAL | Status: DC

## 2013-09-04 NOTE — Progress Notes (Signed)
Subjective:    Patient ID: Mark [name], male    DOB: 07/20/1946, 67 y.o.   MRN: [id number]  HPI 08/21/13 Patient was recently admitted to the hospital for COPD exacerbation as well as community-acquired pneumonia. He was treated with IV antibiotics, IV steroids, and nebulizer treatments. However in the hospital he developed a fibrillation with rapid ventricular response. He was eventually managed with diltiazem, metoprolol, and anticoagulated on Coumadin. He is currently on Coumadin 2 mg a day. His most recent INR was checked 2 days ago was found to be therapeutic at 2.2. He has completed his antibiotics. He is slowly tapering off prednisone. He continues to wear oxygen. He is 96% on room air today on oxygen 96% on room air today off oxygen. He continues to have distant breath sounds and right basilar crackles. Overall he feels well. His heart rate is ranging 80-95 today.  He denies any syncope, presyncope, chest pain.  He continues to have some dyspnea on exertion. He is only had to use the albuterol inhaler 2 times since his discharge from hospital.  At that time, my plan was: 1. Hospital discharge follow-up He is no longer smoking. I did discuss Chantix with him but he does not really need it at the present time. I recommended continuing oxygen until his follow up visit in 2 weeks. At that time, I hope we can discontinue his oxygen. Also like to recheck his lungs in 2 weeks to make sure the pneumonia has clinically improved.  2. COPD exacerbation Continue to wean off the prednisone. Use albuterol 2 puffs inhaled every 6 hours as needed for wheezing. If he does require the albuterol often, I would add Symbicort, another long acting bronchodilator.  I will give the patient Prevnar 13 once he is off prednisone.  3. Atrial fibrillation Currently rate controlled and appropriately anticoagulated. In the future I would be glad to check his Coumadin level here to save the patient a trip into  [place]. We can also discuss possibly switching him to eliquis in the future once he is stable. I would not be surprised if he spontaneously cardioverted back to normal sinus rhythm once his primary issues resolve.  Recheck in 2 weeks.  Patient is here today for followup. Since I saw the patient 2 weeks ago he has gained 10 pounds in fluid. He has +2 pitting edema to the level of his knees. His tachycardic heart rate ranging between 95 and 115. He is currently in atrial fibrillation. He does report some dyspnea on exertion and shortness of breath. He denies any fever or coughing. He is no longer wearing oxygen his pulse oximetry is 97% on room air. Past Medical History  Diagnosis Date  . GERD (gastroesophageal reflux disease)   . Hyperlipidemia   . Atrial fibrillation     stress test 10/09/12  . H/O hiatal hernia   . Arthritis   . Hypertension     sees Dr. Lynnea Ferrier  . Chronic kidney disease    Past Surgical History  Procedure Laterality Date  . Knee surgery      right  . Abdominal aortic aneurysm repair  10/15/2012    Procedure: ANEURYSM ABDOMINAL AORTIC REPAIR;  Surgeon: Pryor Ochoa, MD;  Location: [facility] OR;  Service: Vascular;  Laterality: N/A;  Resection and Grafting of Abdominal Aortic Aneurysm - Aortobi-Iliac   . Abdominal aortic aneurysm repair     Current Outpatient Prescriptions on File Prior to Visit  Medication Sig Dispense Refill  .  albuterol (PROVENTIL HFA;VENTOLIN HFA) 108 (90 BASE) MCG/ACT inhaler Inhale 2 puffs into the lungs every 6 (six) hours as needed for wheezing or shortness of breath.  1 Inhaler  2  . amLODipine (NORVASC) 10 MG tablet TAKE 1 TABLET BY MOUTH AT BEDTIME  30 tablet  5  . atorvastatin (LIPITOR) 40 MG tablet TAKE 1 TABLET AT BEDTIME.STOP TAKING SIMVASTATIN  30 tablet  3  . diltiazem (CARDIZEM CD) 360 MG 24 hr capsule Take 1 capsule (360 mg total) by mouth daily.  30 capsule  1  . ipratropium (ATROVENT HFA) 17 MCG/ACT inhaler Inhale 2 puffs into  the lungs every 6 (six) hours as needed for wheezing.  1 Inhaler  12  . losartan (COZAAR) 100 MG tablet TAKE 1 TABLET EVERY DAY (DOSAGE INCREASED)  30 tablet  3  . metoprolol succinate (TOPROL-XL) 100 MG 24 hr tablet Take 1 tablet (100 mg total) by mouth daily. Take with or immediately following a meal.  30 tablet  1  . omeprazole (PRILOSEC) 40 MG capsule Take 1 capsule (40 mg total) by mouth 2 (two) times daily.  60 capsule  1  . warfarin (COUMADIN) 3 MG tablet Take as directed by coumadin clinic  40 tablet  1   No current facility-administered medications on file prior to visit.   Allergies  Allergen Reactions  . Niaspan [Niacin Er] Anaphylaxis   History   Social History  . Marital Status: Married    Spouse Name: N/A    Number of Children: N/A  . Years of Education: N/A   Occupational History  . Not on file.   Social History Main Topics  . Smoking status: Current Every Day Smoker -- 0.30 packs/day for 50 years    Types: Cigarettes  . Smokeless tobacco: Never Used     Comment: pt states that he does not smoke over 5 cigs per day  . Alcohol Use: No  . Drug Use: No  . Sexual Activity: Not on file   Other Topics Concern  . Not on file   Social History Narrative  . No narrative on file      Review of Systems  All other systems reviewed and are negative.       Objective:   Physical Exam  Vitals reviewed. Cardiovascular: Normal heart sounds.  An irregularly irregular rhythm present. Tachycardia present.   No murmur heard. Pulmonary/Chest: Effort normal. He has decreased breath sounds. He has no wheezes. He has no rhonchi. He has rales in the right lower field and the left lower field.  Abdominal: Soft. Bowel sounds are normal. He exhibits no distension. There is no tenderness. There is no rebound.  Musculoskeletal: He exhibits edema.          Assessment & Plan:   1. Edema Begin Lasix 40 mg by mouth daily. Recheck the patient next week. I will repeat a BMP at  that time to make sure his potassium is not low and that the lasix is not exacerbating his chronic kidney disease. - furosemide (LASIX) 40 MG tablet; Take 1 tablet (40 mg total) by mouth daily.  Dispense: 30 tablet; Refill: 3  2. Atrial fibrillation Patient's heart rate is currently not controlled. I recommend increasing Toprol-XL to 200 mg by mouth daily. I have the patient return next week to recheck his heart rate, and blood pressure, as well as his kidney function and his edema. Ultimately I think the patient would benefit from cardioversion. The patient is scheduled for an  INR at his cardiologist's office on Friday. If the patient has 4 consecutive therapeutic INRs, I believe they will schedule him for cardioversion

## 2013-09-07 ENCOUNTER — Encounter: Payer: Self-pay | Admitting: Family Medicine

## 2013-09-07 NOTE — Telephone Encounter (Signed)
He was supposed to COME back in to recheck his swelling and his heart rate.

## 2013-09-07 NOTE — Telephone Encounter (Signed)
Pt called and stated that he was suppose to call back and speak to Dr Tanya Nones today Call back number is [phone]

## 2013-09-08 ENCOUNTER — Encounter (HOSPITAL_COMMUNITY): Payer: Self-pay | Admitting: Emergency Medicine

## 2013-09-08 ENCOUNTER — Emergency Department (HOSPITAL_COMMUNITY): Payer: Medicare Other

## 2013-09-08 ENCOUNTER — Encounter: Payer: Self-pay | Admitting: Family Medicine

## 2013-09-08 ENCOUNTER — Inpatient Hospital Stay (HOSPITAL_COMMUNITY)
Admission: EM | Admit: 2013-09-08 | Discharge: 2013-09-12 | DRG: 291 | Disposition: A | Discharge status: Home / Self Care | Payer: Medicare Other | Attending: Internal Medicine | Admitting: Internal Medicine

## 2013-09-08 ENCOUNTER — Ambulatory Visit (INDEPENDENT_AMBULATORY_CARE_PROVIDER_SITE_OTHER): Payer: Medicare Other | Admitting: Family Medicine

## 2013-09-08 VITALS — BP 146/100 | HR 100 | Temp 97.0°F | Resp 22 | Ht 66.5 in | Wt 159.0 lb

## 2013-09-08 DIAGNOSIS — R0902 Hypoxemia: Secondary | ICD-10-CM

## 2013-09-08 DIAGNOSIS — I4891 Unspecified atrial fibrillation: Secondary | ICD-10-CM

## 2013-09-08 DIAGNOSIS — N179 Acute kidney failure, unspecified: Secondary | ICD-10-CM

## 2013-09-08 DIAGNOSIS — K219 Gastro-esophageal reflux disease without esophagitis: Secondary | ICD-10-CM | POA: Diagnosis present

## 2013-09-08 DIAGNOSIS — R609 Edema, unspecified: Secondary | ICD-10-CM

## 2013-09-08 DIAGNOSIS — N184 Chronic kidney disease, stage 4 (severe): Secondary | ICD-10-CM

## 2013-09-08 DIAGNOSIS — J9620 Acute and chronic respiratory failure, unspecified whether with hypoxia or hypercapnia: Secondary | ICD-10-CM

## 2013-09-08 DIAGNOSIS — E875 Hyperkalemia: Secondary | ICD-10-CM

## 2013-09-08 DIAGNOSIS — Z833 Family history of diabetes mellitus: Secondary | ICD-10-CM

## 2013-09-08 DIAGNOSIS — I509 Heart failure, unspecified: Secondary | ICD-10-CM

## 2013-09-08 DIAGNOSIS — E785 Hyperlipidemia, unspecified: Secondary | ICD-10-CM

## 2013-09-08 DIAGNOSIS — N183 Chronic kidney disease, stage 3 unspecified: Secondary | ICD-10-CM

## 2013-09-08 DIAGNOSIS — Z8701 Personal history of pneumonia (recurrent): Secondary | ICD-10-CM

## 2013-09-08 DIAGNOSIS — I70219 Atherosclerosis of native arteries of extremities with intermittent claudication, unspecified extremity: Secondary | ICD-10-CM

## 2013-09-08 DIAGNOSIS — J189 Pneumonia, unspecified organism: Secondary | ICD-10-CM

## 2013-09-08 DIAGNOSIS — I10 Essential (primary) hypertension: Secondary | ICD-10-CM

## 2013-09-08 DIAGNOSIS — I5033 Acute on chronic diastolic (congestive) heart failure: Principal | ICD-10-CM

## 2013-09-08 DIAGNOSIS — I714 Abdominal aortic aneurysm, without rupture, unspecified: Secondary | ICD-10-CM

## 2013-09-08 DIAGNOSIS — I959 Hypotension, unspecified: Secondary | ICD-10-CM | POA: Diagnosis present

## 2013-09-08 DIAGNOSIS — I129 Hypertensive chronic kidney disease with stage 1 through stage 4 chronic kidney disease, or unspecified chronic kidney disease: Secondary | ICD-10-CM | POA: Diagnosis present

## 2013-09-08 DIAGNOSIS — R0600 Dyspnea, unspecified: Secondary | ICD-10-CM

## 2013-09-08 DIAGNOSIS — N189 Chronic kidney disease, unspecified: Secondary | ICD-10-CM

## 2013-09-08 DIAGNOSIS — Z720 Tobacco use: Secondary | ICD-10-CM

## 2013-09-08 DIAGNOSIS — Z7901 Long term (current) use of anticoagulants: Secondary | ICD-10-CM

## 2013-09-08 DIAGNOSIS — Z87891 Personal history of nicotine dependence: Secondary | ICD-10-CM

## 2013-09-08 DIAGNOSIS — Z8249 Family history of ischemic heart disease and other diseases of the circulatory system: Secondary | ICD-10-CM

## 2013-09-08 DIAGNOSIS — T502X5A Adverse effect of carbonic-anhydrase inhibitors, benzothiadiazides and other diuretics, initial encounter: Secondary | ICD-10-CM | POA: Diagnosis present

## 2013-09-08 DIAGNOSIS — M129 Arthropathy, unspecified: Secondary | ICD-10-CM | POA: Diagnosis present

## 2013-09-08 DIAGNOSIS — D649 Anemia, unspecified: Secondary | ICD-10-CM | POA: Diagnosis present

## 2013-09-08 DIAGNOSIS — J441 Chronic obstructive pulmonary disease with (acute) exacerbation: Secondary | ICD-10-CM

## 2013-09-08 HISTORY — DX: Chronic obstructive pulmonary disease, unspecified: J44.9

## 2013-09-08 HISTORY — DX: Chronic kidney disease, stage 3 unspecified: N18.30

## 2013-09-08 HISTORY — DX: Abdominal aortic aneurysm, without rupture, unspecified: I71.40

## 2013-09-08 HISTORY — DX: Abdominal aortic aneurysm, without rupture: I71.4

## 2013-09-08 HISTORY — DX: Chronic kidney disease, stage 3 (moderate): N18.3

## 2013-09-08 LAB — COMPREHENSIVE METABOLIC PANEL
ALT: 21 U/L (ref 0–53)
AST: 16 U/L (ref 0–37)
Albumin: 3.1 g/dL — ABNORMAL LOW (ref 3.5–5.2)
Alkaline Phosphatase: 87 U/L (ref 39–117)
BUN: 19 mg/dL (ref 6–23)
Calcium: 9.2 mg/dL (ref 8.4–10.5)
Chloride: 97 mEq/L (ref 96–112)
GFR calc Af Amer: 39 mL/min — ABNORMAL LOW (ref >90)
Potassium: 4.2 mEq/L (ref 3.5–5.1)
Sodium: 140 mEq/L (ref 135–145)
Total Protein: 7 g/dL (ref 6.0–8.3)

## 2013-09-08 LAB — CBC
MCH: 32.2 pg (ref 26.0–34.0)
MCHC: 33.7 g/dL (ref 30.0–36.0)
Platelets: 135 10*3/uL — ABNORMAL LOW (ref 150–400)
RBC: 4.04 MIL/uL — ABNORMAL LOW (ref 4.22–5.81)
RDW: 13.5 % (ref 11.5–15.5)
WBC: 4.7 10*3/uL (ref 4.0–10.5)

## 2013-09-08 LAB — PROTIME-INR
INR: 1.82 — ABNORMAL HIGH (ref 0.00–1.49)
Prothrombin Time: 20.5 seconds — ABNORMAL HIGH (ref 11.6–15.2)

## 2013-09-08 MED ORDER — ONDANSETRON HCL 4 MG/2ML IJ SOLN: 4.0000 mg | Freq: Four times a day (QID) | INTRAMUSCULAR | Status: DC | PRN

## 2013-09-08 MED ORDER — ACETAMINOPHEN 325 MG PO TABS: 650.0000 mg | ORAL_TABLET | Freq: Four times a day (QID) | ORAL | Status: DC | PRN

## 2013-09-08 MED ORDER — FUROSEMIDE 10 MG/ML IJ SOLN
60.0000 mg | Freq: Two times a day (BID) | INTRAMUSCULAR | Status: DC
  Administered 2013-09-08 – 2013-09-09 (×3): 60 mg via INTRAVENOUS
  Filled 2013-09-08 (×7): qty 6

## 2013-09-08 MED ORDER — ONDANSETRON HCL 4 MG PO TABS: 4.0000 mg | ORAL_TABLET | Freq: Four times a day (QID) | ORAL | Status: DC | PRN

## 2013-09-08 MED ORDER — ALBUTEROL SULFATE (5 MG/ML) 0.5% IN NEBU
2.5000 mg | INHALATION_SOLUTION | Freq: Three times a day (TID) | RESPIRATORY_TRACT | Status: DC
  Administered 2013-09-09 (×2): 2.5 mg via RESPIRATORY_TRACT
  Filled 2013-09-08 (×2): qty 0.5

## 2013-09-08 MED ORDER — ALBUTEROL SULFATE (5 MG/ML) 0.5% IN NEBU
2.5000 mg | INHALATION_SOLUTION | RESPIRATORY_TRACT | Status: DC
  Administered 2013-09-08: 17:00:00 2.5 mg via RESPIRATORY_TRACT
  Filled 2013-09-08: qty 0.5

## 2013-09-08 MED ORDER — SODIUM CHLORIDE 0.9 % IV SOLN
INTRAVENOUS | Status: DC
  Administered 2013-09-08: 1000 mL via INTRAVENOUS

## 2013-09-08 MED ORDER — PIPERACILLIN-TAZOBACTAM 3.375 G IVPB
3.3750 g | Freq: Once | INTRAVENOUS | Status: AC
  Administered 2013-09-08: 3.375 g via INTRAVENOUS
  Filled 2013-09-08: qty 50

## 2013-09-08 MED ORDER — FUROSEMIDE 10 MG/ML IJ SOLN
40.0000 mg | Freq: Once | INTRAMUSCULAR | Status: AC
  Administered 2013-09-08: 40 mg via INTRAVENOUS
  Filled 2013-09-08: qty 4

## 2013-09-08 MED ORDER — ATORVASTATIN CALCIUM 40 MG PO TABS
40.0000 mg | ORAL_TABLET | Freq: Every day | ORAL | Status: DC
  Administered 2013-09-08 – 2013-09-12 (×4): 40 mg via ORAL
  Filled 2013-09-08 (×5): qty 1

## 2013-09-08 MED ORDER — DILTIAZEM HCL ER COATED BEADS 360 MG PO CP24
360.0000 mg | ORAL_CAPSULE | Freq: Every day | ORAL | Status: DC
  Filled 2013-09-08: qty 1

## 2013-09-08 MED ORDER — VANCOMYCIN HCL IN DEXTROSE 1-5 GM/200ML-% IV SOLN
1000.0000 mg | Freq: Once | INTRAVENOUS | Status: DC
  Filled 2013-09-08: qty 200

## 2013-09-08 MED ORDER — ALUM & MAG HYDROXIDE-SIMETH 200-200-20 MG/5ML PO SUSP: 30.0000 mL | Freq: Four times a day (QID) | ORAL | Status: DC | PRN

## 2013-09-08 MED ORDER — WARFARIN SODIUM 4 MG PO TABS
4.5000 mg | ORAL_TABLET | Freq: Once | ORAL | Status: DC
  Filled 2013-09-08: qty 1

## 2013-09-08 MED ORDER — PANTOPRAZOLE SODIUM 40 MG PO TBEC
40.0000 mg | DELAYED_RELEASE_TABLET | Freq: Every day | ORAL | Status: DC
  Administered 2013-09-09 – 2013-09-12 (×4): 40 mg via ORAL
  Filled 2013-09-08 (×4): qty 1

## 2013-09-08 MED ORDER — IPRATROPIUM BROMIDE HFA 17 MCG/ACT IN AERS
2.0000 | INHALATION_SPRAY | Freq: Four times a day (QID) | RESPIRATORY_TRACT | Status: DC | PRN
  Filled 2013-09-08: qty 12.9

## 2013-09-08 MED ORDER — ALBUTEROL SULFATE (5 MG/ML) 0.5% IN NEBU: 2.5000 mg | INHALATION_SOLUTION | RESPIRATORY_TRACT | Status: DC

## 2013-09-08 MED ORDER — IPRATROPIUM BROMIDE 0.02 % IN SOLN
0.5000 mg | Freq: Three times a day (TID) | RESPIRATORY_TRACT | Status: DC
  Administered 2013-09-09 (×2): 0.5 mg via RESPIRATORY_TRACT
  Filled 2013-09-08 (×2): qty 2.5

## 2013-09-08 MED ORDER — METOPROLOL SUCCINATE ER 100 MG PO TB24
100.0000 mg | ORAL_TABLET | Freq: Every day | ORAL | Status: DC
  Administered 2013-09-09 – 2013-09-10 (×2): 100 mg via ORAL
  Filled 2013-09-08 (×4): qty 1

## 2013-09-08 MED ORDER — HYDROCODONE-ACETAMINOPHEN 5-325 MG PO TABS: 1.0000 | ORAL_TABLET | ORAL | Status: DC | PRN

## 2013-09-08 MED ORDER — RIVAROXABAN 15 MG PO TABS
15.0000 mg | ORAL_TABLET | Freq: Every day | ORAL | Status: DC
  Administered 2013-09-08 – 2013-09-11 (×4): 15 mg via ORAL
  Filled 2013-09-08 (×6): qty 1

## 2013-09-08 MED ORDER — IPRATROPIUM BROMIDE 0.02 % IN SOLN
0.5000 mg | Freq: Once | RESPIRATORY_TRACT | Status: AC
  Administered 2013-09-08: 0.5 mg via RESPIRATORY_TRACT
  Filled 2013-09-08: qty 2.5

## 2013-09-08 MED ORDER — IPRATROPIUM BROMIDE 0.02 % IN SOLN: 0.5000 mg | Freq: Three times a day (TID) | RESPIRATORY_TRACT | Status: DC

## 2013-09-08 MED ORDER — SODIUM CHLORIDE 0.9 % IJ SOLN
3.0000 mL | Freq: Two times a day (BID) | INTRAMUSCULAR | Status: DC
  Administered 2013-09-08 – 2013-09-12 (×8): 3 mL via INTRAVENOUS

## 2013-09-08 MED ORDER — ALBUTEROL SULFATE (5 MG/ML) 0.5% IN NEBU
5.0000 mg | INHALATION_SOLUTION | Freq: Once | RESPIRATORY_TRACT | Status: AC
  Administered 2013-09-08: 5 mg via RESPIRATORY_TRACT
  Filled 2013-09-08: qty 1

## 2013-09-08 MED ORDER — WARFARIN - PHARMACIST DOSING INPATIENT: Freq: Every day | Status: DC

## 2013-09-08 MED ORDER — SODIUM CHLORIDE 0.9 % IJ SOLN: 3.0000 mL | INTRAMUSCULAR | Status: DC | PRN

## 2013-09-08 MED ORDER — AMIODARONE HCL 200 MG PO TABS
200.0000 mg | ORAL_TABLET | Freq: Two times a day (BID) | ORAL | Status: DC
  Administered 2013-09-08 – 2013-09-12 (×8): 200 mg via ORAL
  Filled 2013-09-08 (×10): qty 1

## 2013-09-08 MED ORDER — AMLODIPINE BESYLATE 10 MG PO TABS
10.0000 mg | ORAL_TABLET | Freq: Every day | ORAL | Status: DC
  Administered 2013-09-08 – 2013-09-10 (×3): 10 mg via ORAL
  Filled 2013-09-08 (×3): qty 1

## 2013-09-08 MED ORDER — ACETAMINOPHEN 650 MG RE SUPP: 650.0000 mg | Freq: Four times a day (QID) | RECTAL | Status: DC | PRN

## 2013-09-08 MED ORDER — SODIUM CHLORIDE 0.9 % IV SOLN: 250.0000 mL | INTRAVENOUS | Status: DC | PRN

## 2013-09-08 MED ORDER — IPRATROPIUM BROMIDE 0.02 % IN SOLN
0.5000 mg | RESPIRATORY_TRACT | Status: DC
  Administered 2013-09-08: 17:00:00 0.5 mg via RESPIRATORY_TRACT
  Filled 2013-09-08: qty 2.5

## 2013-09-08 MED ORDER — ALBUTEROL SULFATE HFA 108 (90 BASE) MCG/ACT IN AERS
2.0000 | INHALATION_SPRAY | Freq: Four times a day (QID) | RESPIRATORY_TRACT | Status: DC | PRN
  Filled 2013-09-08: qty 6.7

## 2013-09-08 MED ORDER — LOSARTAN POTASSIUM 50 MG PO TABS
100.0000 mg | ORAL_TABLET | Freq: Every day | ORAL | Status: DC
  Administered 2013-09-08 – 2013-09-09 (×2): 100 mg via ORAL
  Filled 2013-09-08 (×3): qty 2

## 2013-09-08 MED ORDER — METHYLPREDNISOLONE SODIUM SUCC 125 MG IJ SOLR
80.0000 mg | Freq: Two times a day (BID) | INTRAMUSCULAR | Status: DC
  Administered 2013-09-08 – 2013-09-09 (×2): 80 mg via INTRAVENOUS
  Filled 2013-09-08 (×4): qty 1.28

## 2013-09-08 MED ORDER — SODIUM CHLORIDE 0.9 % IJ SOLN
3.0000 mL | Freq: Two times a day (BID) | INTRAMUSCULAR | Status: DC
  Administered 2013-09-08 – 2013-09-09 (×2): 3 mL via INTRAVENOUS

## 2013-09-08 NOTE — Progress Notes (Signed)
Pharmacy: Xarelto  67yom on coumadin for afib, now being transitioned to xarelto with plans for outpatient cardioversion in ~3 weeks. INR is subtherapeutic at 1.8. Patient has renal insufficiency with sCr 1.9 and CrCl 64ml/min.  Plan: 1) Xarelto 15mg  po daily with supper - first dose this evening 2) Watch renal function, CBC  Louie Casa, PharmD, BCPS 09/08/13, 4:29 PM

## 2013-09-08 NOTE — Consult Note (Addendum)
CARDIOLOGY CONSULT NOTE   Patient ID: MASSIMILIANO ROHLEDER MRN: [id number], DOB/AGE: 1946-01-27   Admit date: 09/08/2013 Date of Consult: 09/08/2013   Primary Physician: Leo Grosser, MD Primary Cardiologist: Dr. Patty Sermons  Pt. Profile  Admitted with exacerbation of dyspnea  Problem List  Past Medical History  Diagnosis Date  . GERD (gastroesophageal reflux disease)   . Hyperlipidemia   . Atrial fibrillation     stress test 10/09/12  . H/O hiatal hernia   . Arthritis   . Hypertension     sees Dr. Lynnea Ferrier  . Chronic kidney disease     Past Surgical History  Procedure Laterality Date  . Knee surgery      right  . Abdominal aortic aneurysm repair  10/15/2012    Procedure: ANEURYSM ABDOMINAL AORTIC REPAIR;  Surgeon: Pryor Ochoa, MD;  Location: [facility] OR;  Service: Vascular;  Laterality: N/A;  Resection and Grafting of Abdominal Aortic Aneurysm - Aortobi-Iliac   . Abdominal aortic aneurysm repair       Allergies  Allergies  Allergen Reactions  . Niaspan [Niacin Er] Anaphylaxis    HPI   This 67 year old gentleman is readmitted with exacerbation of dyspnea.  He went to see his PCP Dr. Tanya Nones today and was noted to be more dyspneic and his ventricular response to atrial fibrillation was in the 120s and he was referred for admission.  The patient was recently hospitalized on 08/11/13 and discharged on 08/17/13.  He was hospitalized with COPD with exacerbation.  He developed atrial fibrillation and was started on warfarin.  His echocardiogram showed normal systolic function with ejection fraction of 55-60% and no motion abnormalities.  He was discharged on diltiazem 360 mg daily and metoprolol 100 mg daily for rate control.  Since discharge he has reaccumulated peripheral edema and has noted increased exertional dyspnea.  He has not smoked since his previous admission.  He has not been having any chest pain.  He has been getting weekly prothrombin times.  Unfortunately  his INR today is subtherapeutic at 1.82.  Inpatient Medications  . ipratropium  0.5 mg Nebulization Q4H   And  . albuterol  2.5 mg Nebulization Q4H  . amLODipine  10 mg Oral Daily  . atorvastatin  40 mg Oral q1800  . [START ON 09/09/2013] diltiazem  360 mg Oral Daily  . furosemide  60 mg Intravenous BID  . losartan  100 mg Oral Daily  . methylPREDNISolone (SOLU-MEDROL) injection  80 mg Intravenous Q12H  . [START ON 09/09/2013] metoprolol succinate  100 mg Oral QPC breakfast  . [START ON 09/09/2013] pantoprazole  40 mg Oral Daily  . piperacillin-tazobactam (ZOSYN)  IV  3.375 g Intravenous Once  . sodium chloride  3 mL Intravenous Q12H  . sodium chloride  3 mL Intravenous Q12H  . warfarin  4.5 mg Oral ONCE-1800  . Warfarin - Pharmacist Dosing Inpatient   Does not apply q1800    Family History Family History  Problem Relation Age of Onset  . Diabetes Mother   . Heart disease Father   . Heart attack Father      Social History History   Social History  . Marital Status: Married    Spouse Name: Nicole Cella    Number of Children: 4  . Years of Education: N/A   Occupational History  . Works PT as a Education administrator    Social History Main Topics  . Smoking status: Former Smoker -- 0.30 packs/day for 50 years    Types:  Cigarettes  . Smokeless tobacco: Never Used     Comment: pt states that he does not smoke over 5 cigs per day  . Alcohol Use: No  . Drug Use: No  . Sexual Activity: Not on file   Other Topics Concern  . Not on file   Social History Narrative   Married.  Lives with wife.  Ambulates without assistance.     Review of Systems  General:  No chills, fever, night sweats or weight changes.  Cardiovascular:  No chest pain, dyspnea on exertion, edema, orthopnea, palpitations, paroxysmal nocturnal dyspnea. Dermatological: No rash, lesions/masses Respiratory: No cough, dyspnea Urologic: No hematuria, dysuria Abdominal:   No nausea, vomiting, diarrhea, bright red blood per  rectum, melena, or hematemesis Neurologic:  No visual changes, wkns, changes in mental status. All other systems reviewed and are otherwise negative except as noted above.  Physical Exam  Blood pressure 127/85, pulse 103, temperature 97.6 F (36.4 C), temperature source Oral, resp. rate 18, height 5\' 6"  (1.676 m), weight 154 lb 1.6 oz (69.9 kg), SpO2 97.00%.  General: Pleasant, NAD; nasal oxygen in place Psych: Normal affect. Neuro: Alert and oriented X 3. Moves all extremities spontaneously. HEENT: Normal  Neck: Supple without bruits or JVD. Lungs:  Minimal rales.  No acute respiratory distress. Heart: RRR no s3, s4, or murmurs. Abdomen: Soft, non-tender, non-distended, BS + x 4.  Extremities: There is 1+ pretibial and pedal edema Labs   Recent Labs  09/08/13 1032  TROPONINI <0.30   Lab Results  Component Value Date   WBC 4.7 09/08/2013   HGB 13.0 09/08/2013   HCT 38.6* 09/08/2013   MCV 95.5 09/08/2013   PLT 135* 09/08/2013     Recent Labs Lab 09/08/13 1032  NA 140  K 4.2  CL 97  CO2 34*  BUN 19  CREATININE 1.94*  CALCIUM 9.2  PROT 7.0  BILITOT 0.3  ALKPHOS 87  ALT 21  AST 16  GLUCOSE 137*   Lab Results  Component Value Date   CHOL 120 04/13/2013   HDL 35* 04/13/2013   LDLCALC 61 04/13/2013   TRIG 118 04/13/2013   No results found for this basename: DDIMER    Radiology/Studies  Dg Chest 2 View  09/08/2013   CLINICAL DATA:  Dyspnea and weakness and recent pneumonia  EXAM: CHEST  2 VIEW  COMPARISON:  Chest x-ray dated August 14, 2013.  FINDINGS: The lungs are well-expanded. The pleural effusion on the left is increased slightly in size. Pleural fluid has developed on the right as well. There are coarse lung markings in the retrocardiac region on the lateral film data lobe may reflect a bibasilar pneumonia. The cardiopericardial silhouette is normal in size. The pulmonary vascularity is not engorged. The mediastinum is normal in width. The observed  portions of the bony thorax exhibit no acute abnormalities.  IMPRESSION: The findings are worrisome for bibasilar pneumonia with small but increasing bilateral pleural effusions. There is no evidence of CHF.   Electronically Signed   By: David  [place]   On: 09/08/2013 11:06   Dg Chest 2 View  08/14/2013   CLINICAL DATA:  Resolution of pneumonia.  EXAM: CHEST  2 VIEW  COMPARISON:  08/11/2013  FINDINGS: Heart, mediastinal, and hilar contours are stable hand within normal limits. Aeration at the left lung base is improving, but not completely clear. There is a small left basilar patchy opacity. A small left pleural effusion appears larger on today's chest radiograph compared to recent  prior. The right lung appears clear. Previously described right middle lobe nodule on prior chest CT is not visible on this chest radiograph. Probable bilateral nipple shadows noted. No acute osseous abnormality.  IMPRESSION: 1. Faint left basilar opacity likely reflects residual airspace disease related to known pneumonia or aspiration. 2. Small left pleural effusion (likely parapneumonic), is increased in size from recent prior study.   Electronically Signed   By: Britta Mccreedy M.D.   On: 08/14/2013 18:24   Dg Chest 2 View  08/11/2013   CLINICAL DATA:  Shortness of breath.  EXAM: CHEST  2 VIEW  COMPARISON:  Chest radiograph 10/16/2012.  FINDINGS: Stable cardiac and mediastinal contours. Minimal heterogeneous opacities left lung base. 8 mm nodular density within the right lower lung. No definite pleural effusion or pneumothorax Regional skeleton is unremarkable.  IMPRESSION: 1. Heterogeneous opacities left lung base may represent infection in the appropriate clinical setting. Radiographic followup to ensure resolution is recommended. 2. 8 mm nodular density projecting over the right lower lung demonstrated on prior CT 10/10/2012. Follow-up as per prior CT report.   Electronically Signed   By: Annia Belt M.D.   On: 08/11/2013  18:06   Dg Ugi W/high Density W/kub  08/17/2013   CLINICAL DATA:  Dysphagia.  EXAM: UPPER GI SERIES W/HIGH DENSITY W/KUB  TECHNIQUE: After obtaining a scout radiograph a routine upper GI series was performed using thin and high density barium.  COMPARISON:  None.  FLUOROSCOPY TIME:  3 min and 13 seconds  FINDINGS: Initial barium swallows demonstrate normal pharyngeal motion with swallowing. No laryngeal penetration or aspiration. No upper esophageal webs, strictures or diverticuli.  Nonspecific esophageal dysmotility with occasional disruption of the primary peristaltic wave. There is a small to moderate-sized hiatal hernia with thickened gastric folds in the hernia. No obvious ulceration. No mass or obstruction. The 13 mm barium pill did pass into the stomach. GE reflux is demonstrated.  The stomach, duodenum bulb and C-loop are unremarkable. No ulceration or obstruction.  IMPRESSION: 1. Nonspecific esophageal dysmotility. 2. Small to moderate-sized hiatal hernia with thickening gastric folds in the hernia. GE reflux was demonstrated. 3. The stomach, duodenum bulb and C-loop are unremarkable.   Electronically Signed   By: Loralie Champagne M.D.   On: 08/17/2013 15:50    ECG  Atrial fibrillation No significant change since last tracing  2D Echo  Study Conclusions  - Left ventricle: The cavity size was normal. Wall thickness was normal. Systolic function was normal. The estimated ejection fraction was in the range of 55% to 60%. Wall motion was normal; there were no regional wall motion abnormalities. - Mitral valve: Mild regurgitation. - Left atrium: The atrium was mildly dilated.  ASSESSMENT AND PLAN 1. persistent atrial fibrillation.  INR subtherapeutic 2. acute on chronic diastolic CHF 3. COPD with exacerbation 4. chronic kidney disease 5. possible pneumonia versus pleural effusions from CHF  Plan: Continue rate control with diltiazem and metoprolol.  I will add low dose  amiodarone. Continue diuresis with Lasix We will convert him from warfarin to Xarelto.  He is subtherapeutic on warfarin.  After 3 weeks of daily Xarelto we will consider outpatient cardioversion.  Signed, Cassell Clement,  MD  09/08/2013, 3:41 PM

## 2013-09-08 NOTE — ED Provider Notes (Signed)
CSN: [id number]     Arrival date & time 09/08/13  1000 History   First MD Initiated Contact with Patient 09/08/13 1024     Chief Complaint  Patient presents with  . Shortness of Breath   (Consider location/radiation/quality/duration/timing/severity/associated sxs/prior Treatment) Patient is a 67 y.o. male presenting with shortness of breath.  Shortness of Breath Associated symptoms: cough   Associated symptoms: no abdominal pain, no chest pain, no fever, no headaches, no neck pain, no rash, no sore throat and no vomiting   pt with hx ckd, afib, chf, presents c/o persistent dyspnea in past week. Also notes bil leg edema, orthopnea. Occasional non prod cough, no sore throat or uri c/o. No fever or chills. States compliant w normal meds. States has seen his pcp 2 x in past couple weeks, states wt down 10 lbs, but still retraining fluid and sob. Sob worse w exertion and lying flat. States is urinating normal amount. Denies any current or recent cp or discomfort.      Past Medical History  Diagnosis Date  . GERD (gastroesophageal reflux disease)   . Hyperlipidemia   . Atrial fibrillation     stress test 10/09/12  . H/O hiatal hernia   . Arthritis   . Hypertension     sees Dr. Lynnea Ferrier  . Chronic kidney disease    Past Surgical History  Procedure Laterality Date  . Knee surgery      right  . Abdominal aortic aneurysm repair  10/15/2012    Procedure: ANEURYSM ABDOMINAL AORTIC REPAIR;  Surgeon: Pryor Ochoa, MD;  Location: [facility] OR;  Service: Vascular;  Laterality: N/A;  Resection and Grafting of Abdominal Aortic Aneurysm - Aortobi-Iliac   . Abdominal aortic aneurysm repair     Family History  Problem Relation Age of Onset  . Diabetes Mother   . Heart disease Father   . Heart attack Father    History  Substance Use Topics  . Smoking status: Former Smoker -- 0.30 packs/day for 50 years    Types: Cigarettes  . Smokeless tobacco: Never Used     Comment: pt states that he does  not smoke over 5 cigs per day  . Alcohol Use: No    Review of Systems  Constitutional: Negative for fever and chills.  HENT: Negative for sore throat.   Eyes: Negative for redness.  Respiratory: Positive for cough and shortness of breath.   Cardiovascular: Positive for leg swelling. Negative for chest pain.  Gastrointestinal: Negative for vomiting, abdominal pain and diarrhea.  Genitourinary: Negative for flank pain.  Musculoskeletal: Negative for back pain and neck pain.  Skin: Negative for rash.  Neurological: Negative for headaches.  Hematological: Does not bruise/bleed easily.  Psychiatric/Behavioral: Negative for confusion.    Allergies  Niaspan  Home Medications   Current Outpatient Rx  Name  Route  Sig  Dispense  Refill  . omeprazole (PRILOSEC) 40 MG capsule   Oral   Take 1 capsule (40 mg total) by mouth 2 (two) times daily.   60 capsule   1   . albuterol (PROVENTIL HFA;VENTOLIN HFA) 108 (90 BASE) MCG/ACT inhaler   Inhalation   Inhale 2 puffs into the lungs every 6 (six) hours as needed for wheezing or shortness of breath.   1 Inhaler   2     ProAir Inhaler C/O to pharmacy   . amLODipine (NORVASC) 10 MG tablet      TAKE 1 TABLET BY MOUTH AT BEDTIME   30 tablet  5   . atorvastatin (LIPITOR) 40 MG tablet      TAKE 1 TABLET AT BEDTIME.STOP TAKING SIMVASTATIN   30 tablet   3   . diltiazem (CARDIZEM CD) 360 MG 24 hr capsule   Oral   Take 1 capsule (360 mg total) by mouth daily.   30 capsule   1   . furosemide (LASIX) 40 MG tablet   Oral   Take 1 tablet (40 mg total) by mouth daily.   30 tablet   3   . ipratropium (ATROVENT HFA) 17 MCG/ACT inhaler   Inhalation   Inhale 2 puffs into the lungs every 6 (six) hours as needed for wheezing.   1 Inhaler   12     Med c/o to pharmacy   . losartan (COZAAR) 100 MG tablet      TAKE 1 TABLET EVERY DAY (DOSAGE INCREASED)   30 tablet   3   . metoprolol succinate (TOPROL-XL) 100 MG 24 hr tablet    Oral   Take 1 tablet (100 mg total) by mouth daily. Take with or immediately following a meal.   30 tablet   1   . warfarin (COUMADIN) 3 MG tablet      Take as directed by coumadin clinic   40 tablet   1    BP 158/76  Pulse 84  Temp(Src) 97.6 F (36.4 C) (Oral)  Resp 24  Ht 5\' 6"  (1.676 m)  Wt 156 lb 3.2 oz (70.852 kg)  BMI 25.22 kg/m2  SpO2 89% Physical Exam  Nursing note and vitals reviewed. Constitutional: He is oriented to person, place, and time. He appears well-developed and well-nourished. No distress.  HENT:  Mouth/Throat: Oropharynx is clear and moist.  Eyes: Conjunctivae are normal.  Neck: Neck supple. No tracheal deviation present.  Cardiovascular: Normal rate, normal heart sounds and intact distal pulses.  Exam reveals no gallop and no friction rub.   No murmur heard. Irregular rhythm  Pulmonary/Chest: Effort normal. No accessory muscle usage. No respiratory distress. He has no wheezes.  Dec bs bases, scattered rales bases  Abdominal: Soft. He exhibits no distension. There is no tenderness.  Musculoskeletal: Normal range of motion. He exhibits edema. He exhibits no tenderness.  Symmetric bilateral leg edema to knees.   Neurological: He is alert and oriented to person, place, and time.  Skin: Skin is warm and dry. He is not diaphoretic.  Psychiatric: He has a normal mood and affect.    ED Course  Procedures (including critical care time)  Results for orders placed during the hospital encounter of 09/08/13  CBC      Result Value Range   WBC 4.7  4.0 - 10.5 K/uL   RBC 4.04 (*) 4.22 - 5.81 MIL/uL   Hemoglobin 13.0  13.0 - 17.0 g/dL   HCT 16.1 (*) 09.6 - 04.5 %   MCV 95.5  78.0 - 100.0 fL   MCH 32.2  26.0 - 34.0 pg   MCHC 33.7  30.0 - 36.0 g/dL   RDW 40.9  81.1 - 91.4 %   Platelets 135 (*) 150 - 400 K/uL  COMPREHENSIVE METABOLIC PANEL      Result Value Range   Sodium 140  135 - 145 mEq/L   Potassium 4.2  3.5 - 5.1 mEq/L   Chloride 97  96 - 112 mEq/L    CO2 34 (*) 19 - 32 mEq/L   Glucose, Bld 137 (*) 70 - 99 mg/dL   BUN 19  6 - 23 mg/dL   Creatinine, Ser 1.61 (*) 0.50 - 1.35 mg/dL   Calcium 9.2  8.4 - 09.6 mg/dL   Total Protein 7.0  6.0 - 8.3 g/dL   Albumin 3.1 (*) 3.5 - 5.2 g/dL   AST 16  0 - 37 U/L   ALT 21  0 - 53 U/L   Alkaline Phosphatase 87  39 - 117 U/L   Total Bilirubin 0.3  0.3 - 1.2 mg/dL   GFR calc non Af Amer 34 (*) >90 mL/min   GFR calc Af Amer 39 (*) >90 mL/min  PRO B NATRIURETIC PEPTIDE      Result Value Range   Pro B Natriuretic peptide (BNP) 5402.0 (*) 0 - 125 pg/mL  TROPONIN I      Result Value Range   Troponin I <0.30  <0.30 ng/mL  PROTIME-INR      Result Value Range   Prothrombin Time 20.5 (*) 11.6 - 15.2 seconds   INR 1.82 (*) 0.00 - 1.49   Dg Chest 2 View  09/08/2013   CLINICAL DATA:  Dyspnea and weakness and recent pneumonia  EXAM: CHEST  2 VIEW  COMPARISON:  Chest x-ray dated August 14, 2013.  FINDINGS: The lungs are well-expanded. The pleural effusion on the left is increased slightly in size. Pleural fluid has developed on the right as well. There are coarse lung markings in the retrocardiac region on the lateral film data lobe may reflect a bibasilar pneumonia. The cardiopericardial silhouette is normal in size. The pulmonary vascularity is not engorged. The mediastinum is normal in width. The observed portions of the bony thorax exhibit no acute abnormalities.  IMPRESSION: The findings are worrisome for bibasilar pneumonia with small but increasing bilateral pleural effusions. There is no evidence of CHF.   Electronically Signed   By: David  [place]   On: 09/08/2013 11:06   Dg Chest 2 View  08/14/2013   CLINICAL DATA:  Resolution of pneumonia.  EXAM: CHEST  2 VIEW  COMPARISON:  08/11/2013  FINDINGS: Heart, mediastinal, and hilar contours are stable hand within normal limits. Aeration at the left lung base is improving, but not completely clear. There is a small left basilar patchy opacity. A small left  pleural effusion appears larger on today's chest radiograph compared to recent prior. The right lung appears clear. Previously described right middle lobe nodule on prior chest CT is not visible on this chest radiograph. Probable bilateral nipple shadows noted. No acute osseous abnormality.  IMPRESSION: 1. Faint left basilar opacity likely reflects residual airspace disease related to known pneumonia or aspiration. 2. Small left pleural effusion (likely parapneumonic), is increased in size from recent prior study.   Electronically Signed   By: Britta Mccreedy M.D.   On: 08/14/2013 18:24   Dg Chest 2 View  08/11/2013   CLINICAL DATA:  Shortness of breath.  EXAM: CHEST  2 VIEW  COMPARISON:  Chest radiograph 10/16/2012.  FINDINGS: Stable cardiac and mediastinal contours. Minimal heterogeneous opacities left lung base. 8 mm nodular density within the right lower lung. No definite pleural effusion or pneumothorax Regional skeleton is unremarkable.  IMPRESSION: 1. Heterogeneous opacities left lung base may represent infection in the appropriate clinical setting. Radiographic followup to ensure resolution is recommended. 2. 8 mm nodular density projecting over the right lower lung demonstrated on prior CT 10/10/2012. Follow-up as per prior CT report.   Electronically Signed   By: Annia Belt M.D.   On: 08/11/2013 18:06   Dg  Ugi W/high Density W/kub  08/17/2013   CLINICAL DATA:  Dysphagia.  EXAM: UPPER GI SERIES W/HIGH DENSITY W/KUB  TECHNIQUE: After obtaining a scout radiograph a routine upper GI series was performed using thin and high density barium.  COMPARISON:  None.  FLUOROSCOPY TIME:  3 min and 13 seconds  FINDINGS: Initial barium swallows demonstrate normal pharyngeal motion with swallowing. No laryngeal penetration or aspiration. No upper esophageal webs, strictures or diverticuli.  Nonspecific esophageal dysmotility with occasional disruption of the primary peristaltic wave. There is a small to  moderate-sized hiatal hernia with thickened gastric folds in the hernia. No obvious ulceration. No mass or obstruction. The 13 mm barium pill did pass into the stomach. GE reflux is demonstrated.  The stomach, duodenum bulb and C-loop are unremarkable. No ulceration or obstruction.  IMPRESSION: 1. Nonspecific esophageal dysmotility. 2. Small to moderate-sized hiatal hernia with thickening gastric folds in the hernia. GE reflux was demonstrated. 3. The stomach, duodenum bulb and C-loop are unremarkable.   Electronically Signed   By: Loralie Champagne M.D.   On: 08/17/2013 15:50     EKG Interpretation    Date/Time:  Tuesday September 08 2013 10:02:42 EST Ventricular Rate:  93 PR Interval:    QRS Duration: 84 QT Interval:  352 QTC Calculation: 437 R Axis:   41 Text Interpretation:  Atrial fibrillation No significant change since last tracing Confirmed by Porschea Borys  MD, Zelena Bushong ([id number]) on 09/08/2013 10:20:25 AM            MDM  Iv ns. Continuous pulse ox and monitor. Labs. Cxr. Ecg.  Reviewed nursing notes and prior charts for additional history.   Radiology interprets cxr as ?pna. Recent hospitalization. vanc and zosyn iv for possible hcap.  bnp elevated, leg edema, orthopnea, lasix iv.  Med service called to admit.      Suzi Roots, MD 09/08/13 [phone]

## 2013-09-08 NOTE — Telephone Encounter (Signed)
This encounter was created in error - please disregard.

## 2013-09-08 NOTE — ED Notes (Signed)
Asked MD about it patient needed blood cultures before starting antibiotics. MD stated no.

## 2013-09-08 NOTE — ED Notes (Signed)
Called report to [place], [place]

## 2013-09-08 NOTE — Progress Notes (Signed)
ANTICOAGULATION CONSULT NOTE - Initial Consult  Pharmacy Consult for Coumadin Indication: atrial fibrillation  Allergies  Allergen Reactions  . Niaspan [Niacin Er] Anaphylaxis    Patient Measurements: Height: 5\' 6"  (167.6 cm) Weight: 154 lb 1.6 oz (69.9 kg) IBW/kg (Calculated) : 63.8 Heparin Dosing Weight: n/a  Vital Signs: Temp: 97.6 F (36.4 C) (12/23 1350) Temp src: Oral (12/23 1350) BP: 127/85 mmHg (12/23 1350) Pulse Rate: 103 (12/23 1350)  Labs:  Recent Labs  09/08/13 1032  HGB 13.0  HCT 38.6*  PLT 135*  LABPROT 20.5*  INR 1.82*  CREATININE 1.94*  TROPONINI <0.30    Estimated Creatinine Clearance: 32.4 ml/min (by C-G formula based on Cr of 1.94).   Medical History: Past Medical History  Diagnosis Date  . GERD (gastroesophageal reflux disease)   . Hyperlipidemia   . Atrial fibrillation     stress test 10/09/12  . H/O hiatal hernia   . Arthritis   . Hypertension     sees Dr. Lynnea Ferrier  . Chronic kidney disease     Medications:  Scheduled:  . ipratropium  0.5 mg Nebulization Q4H   And  . albuterol  2.5 mg Nebulization Q4H  . amLODipine  10 mg Oral Daily  . atorvastatin  40 mg Oral q1800  . [START ON 09/09/2013] diltiazem  360 mg Oral Daily  . furosemide  60 mg Intravenous BID  . losartan  100 mg Oral Daily  . methylPREDNISolone (SOLU-MEDROL) injection  80 mg Intravenous Q12H  . [START ON 09/09/2013] metoprolol succinate  100 mg Oral QPC breakfast  . [START ON 09/09/2013] pantoprazole  40 mg Oral Daily  . piperacillin-tazobactam (ZOSYN)  IV  3.375 g Intravenous Once  . sodium chloride  3 mL Intravenous Q12H  . sodium chloride  3 mL Intravenous Q12H    Assessment: 67 yo male on chronic Coumadin for afib, admitted with SOB.  Pharmacy asked to continue Coumadin.  INR is slightly subtherapeutic today at 1.82.  Home Coumadin dose is 3 mg daily except 4.5 mg on Mon, Wed, and Fri.  Last PTA dose taken yesterday.  No bleeding or complications  noted.  Goal of Therapy:  INR 2-3 Monitor platelets by anticoagulation protocol: Yes   Plan:  1. Coumadin 4.5 mg x 1 tonight. 2. Daily PT/INR.  Tad Moore, BCPS  Clinical Pharmacist Pager [phone]  09/08/2013 2:18 PM

## 2013-09-08 NOTE — Progress Notes (Signed)
Subjective:    Patient ID: Mark [name], male    DOB: [birth year], 67 y.o.   MRN: [id number]  HPI 08/21/13 Patient was recently admitted to the hospital for COPD exacerbation as well as community-acquired pneumonia. He was treated with IV antibiotics, IV steroids, and nebulizer treatments. However in the hospital he developed a fibrillation with rapid ventricular response. He was eventually managed with diltiazem, metoprolol, and anticoagulated on Coumadin. He is currently on Coumadin 2 mg a day. His most recent INR was checked 2 days ago was found to be therapeutic at 2.2. He has completed his antibiotics. He is slowly tapering off prednisone. He continues to wear oxygen. He is 96% on room air today on oxygen 96% on room air today off oxygen. He continues to have distant breath sounds and right basilar crackles. Overall he feels well. His heart rate is ranging 80-95 today.  He denies any syncope, presyncope, chest pain.  He continues to have some dyspnea on exertion. He is only had to use the albuterol inhaler 2 times since his discharge from hospital.  At that time, my plan was: 1. Hospital discharge follow-up He is no longer smoking. I did discuss Chantix with him but he does not really need it at the present time. I recommended continuing oxygen until his follow up visit in 2 weeks. At that time, I hope we can discontinue his oxygen. Also like to recheck his lungs in 2 weeks to make sure the pneumonia has clinically improved.  2. COPD exacerbation Continue to wean off the prednisone. Use albuterol 2 puffs inhaled every 6 hours as needed for wheezing. If he does require the albuterol often, I would add Symbicort, another long acting bronchodilator.  I will give the patient Prevnar 13 once he is off prednisone.  3. Atrial fibrillation Currently rate controlled and appropriately anticoagulated. In the future I would be glad to check his Coumadin level here to [place] the [place] a [place]  [place]. We can also discuss possibly switching him to eliquis in the future once he is stable. I would not be surprised if he spontaneously cardioverted back to normal sinus rhythm once his primary issues resolve.  Recheck in 2 weeks. 09/04/13 Patient is here today for followup. Since I saw the patient 2 weeks ago he has gained 10 pounds in fluid. He has +2 pitting edema to the level of his knees. His tachycardic heart rate ranging between 95 and 115. He is currently in atrial fibrillation. He does report some dyspnea on exertion and shortness of breath. He denies any fever or coughing. He is no longer wearing oxygen his pulse oximetry is 97% on room air.  At that time, my plan was: 1. Edema Begin Lasix 40 mg by mouth daily. Recheck the patient next week. I will repeat a BMP at that time to make sure his potassium is not low and that the lasix is not exacerbating his chronic kidney disease. - furosemide (LASIX) 40 MG tablet; Take 1 tablet (40 mg total) by mouth daily.  Dispense: 30 tablet; Refill: 3  2. Atrial fibrillation Patient's heart rate is currently not controlled. I recommend increasing Toprol-XL to 200 mg by mouth daily. I have the patient return next week to recheck his heart rate, and blood pressure, as well as his kidney function and his edema. Ultimately I think the patient would benefit from cardioversion. The patient is scheduled for an INR at his cardiologist's office on Friday. If the patient has 4  consecutive therapeutic INRs, I believe they will schedule him for cardioversion  09/08/13 The patient has diuresed 10 pounds since his last office visit unfortunately his pulse oximetry on room air is 79%. He reports shortness of breath at rest and significant dyspnea on exertion. There is bilateral crackles on his lung exam consistent with pulmonary edema. He has +2 edema in both legs to the mid shin. He still appears fluid overloaded.  Furthermore on both Cardizem and increase dose of  Toprol-XL his heart rate is still poorly controlled. He is ranging between 100-120. He denies any chest pain.  Past Medical History  Diagnosis Date  . GERD (gastroesophageal reflux disease)   . Hyperlipidemia   . Atrial fibrillation     stress test 10/09/12  . H/O hiatal hernia   . Arthritis   . Hypertension     sees Dr. Lynnea Ferrier  . Chronic kidney disease    Past Surgical History  Procedure Laterality Date  . Knee surgery      right  . Abdominal aortic aneurysm repair  10/15/2012    Procedure: ANEURYSM ABDOMINAL AORTIC REPAIR;  Surgeon: Pryor Ochoa, MD;  Location: [facility] OR;  Service: Vascular;  Laterality: N/A;  Resection and Grafting of Abdominal Aortic Aneurysm - Aortobi-Iliac   . Abdominal aortic aneurysm repair     Current Outpatient Prescriptions on File Prior to Visit  Medication Sig Dispense Refill  . albuterol (PROVENTIL HFA;VENTOLIN HFA) 108 (90 BASE) MCG/ACT inhaler Inhale 2 puffs into the lungs every 6 (six) hours as needed for wheezing or shortness of breath.  1 Inhaler  2  . amLODipine (NORVASC) 10 MG tablet TAKE 1 TABLET BY MOUTH AT BEDTIME  30 tablet  5  . atorvastatin (LIPITOR) 40 MG tablet TAKE 1 TABLET AT BEDTIME.STOP TAKING SIMVASTATIN  30 tablet  3  . diltiazem (CARDIZEM CD) 360 MG 24 hr capsule Take 1 capsule (360 mg total) by mouth daily.  30 capsule  1  . furosemide (LASIX) 40 MG tablet Take 1 tablet (40 mg total) by mouth daily.  30 tablet  3  . ipratropium (ATROVENT HFA) 17 MCG/ACT inhaler Inhale 2 puffs into the lungs every 6 (six) hours as needed for wheezing.  1 Inhaler  12  . losartan (COZAAR) 100 MG tablet TAKE 1 TABLET EVERY DAY (DOSAGE INCREASED)  30 tablet  3  . metoprolol succinate (TOPROL-XL) 100 MG 24 hr tablet Take 1 tablet (100 mg total) by mouth daily. Take with or immediately following a meal.  30 tablet  1  . omeprazole (PRILOSEC) 40 MG capsule Take 1 capsule (40 mg total) by mouth 2 (two) times daily.  60 capsule  1  . warfarin (COUMADIN)  3 MG tablet Take as directed by coumadin clinic  40 tablet  1   No current facility-administered medications on file prior to visit.   Allergies  Allergen Reactions  . Niaspan [Niacin Er] Anaphylaxis   History   Social History  . Marital Status: Married    Spouse Name: N/A    Number of Children: N/A  . Years of Education: N/A   Occupational History  . Not on file.   Social History Main Topics  . Smoking status: Former Smoker -- 0.30 packs/day for 50 years    Types: Cigarettes  . Smokeless tobacco: Never Used     Comment: pt states that he does not smoke over 5 cigs per day  . Alcohol Use: No  . Drug Use: No  .  Sexual Activity: Not on file   Other Topics Concern  . Not on file   Social History Narrative  . No narrative on file      Review of Systems  All other systems reviewed and are negative.       Objective:   Physical Exam  Vitals reviewed. Cardiovascular: Normal heart sounds.  An irregularly irregular rhythm present. Tachycardia present.   No murmur heard. Pulmonary/Chest: Effort normal. He has decreased breath sounds. He has no wheezes. He has no rhonchi. He has rales in the right lower field and the left lower field.  Abdominal: Soft. Bowel sounds are normal. He exhibits no distension. There is no tenderness. There is no rebound.  Musculoskeletal: He exhibits edema.          Assessment & Plan:   Edema  Atrial fibrillation  Unfortunately even after diuresing 10 pounds the patient remains fluid overloaded. Given his history of chronic kidney disease, significant diuresis needs to be monitored with labs to avoid hypokalemia and prerenal azotemia.  Furthermore he needs medication to control his heart rate.  Unfortunately even on the Cardizem and increase dose of the Toprol I have been unsuccessful in controlling his heart rate. Given his significant hypoxia, his underlying chronic kidney disease, I do not feel the patient is stable to be managed at  home over the Christmas holidays. I recommended he go to the emergency room for oxygen, IV diuresis, laboratory studies, and increase medication for heart rate control such as amiodarone

## 2013-09-08 NOTE — ED Notes (Signed)
Pt c/o increased SOB and sent from PCP for SOB and irregular HR; pt tachyonic at present

## 2013-09-08 NOTE — Progress Notes (Signed)
[facility] History and Physical  Mark [name] ZOX:[id number] DOB: 12/01/1945 DOA: 09/08/2013  Referring physician: Dr. Cathren Laine PCP: Leo Grosser, MD   Chief Complaint: Dyspnea   History of Present Illness: Mark [name] is an 67 y.o. male with a PMH of COPD, recent pneumonia, stage III CKD, CHF with preserved EF, atrial fibrillation who was sent to the ER for further evaluation of dyspnea and elevated heart rate.  The patient reports worsening activity tolerance for the past 2 weeks.  Denies fever/chills.  Says he had lost 10 pounds over the last week with medicines prescribed by his doctor to "remove fluid around my lungs".  He was recently hospitalized 08/11/2013-08/17/2013 with a COPD exacerbation and pneumonia. He was discharged on home oxygen, a prednisone taper and Levaquin. While in the hospital, he developed atrial fibrillation with RVR. Heart rate was controlled with metoprolol, Cardizem and he was started on Coumadin. He has seen his PCP regularly since discharge. On 09/04/2013, he was noted to have worsening edema by his PCP. He was started on Lasix 40 mg daily at that time and he was noted to have an elevated heart rate so his Toprol was increased. He was seen again in the office today, and his PCP felt he needs to be managed in the hospital for more aggressive diuresis and to achieve better heart rate control.  Review of Systems: Constitutional: No fever, no chills;  Appetite normal; No weight loss, no weight gain, + fatigue.  HEENT: No blurry vision, no diplopia, no pharyngitis, no dysphagia CV: No chest pain, no palpitations, no PND.  Resp: + SOB, + cough productive of clear sputum, no pleuritic pain. GI: No nausea, no vomiting, no diarrhea, no melena, no hematochezia, no constipation.  GU: No dysuria, no hematuria, no frequency, no urgency. MSK: no myalgias, no arthralgias.  Neuro:  No headache, no focal neurological deficits, no history of seizures.  Psych: No  depression, no anxiety.  Endo: No heat intolerance, no cold intolerance, no polyuria, no polydipsia  Skin: No rashes, no skin lesions.  Heme: No easy bruising.  Travel history: None recent.  Past Medical History Past Medical History  Diagnosis Date  . GERD (gastroesophageal reflux disease)   . Hyperlipidemia   . Atrial fibrillation     stress test 10/09/12  . H/O hiatal hernia   . Arthritis   . Hypertension     sees Dr. Lynnea Ferrier  . Chronic kidney disease      Past Surgical History Past Surgical History  Procedure Laterality Date  . Knee surgery      right  . Abdominal aortic aneurysm repair  10/15/2012    Procedure: ANEURYSM ABDOMINAL AORTIC REPAIR;  Surgeon: Pryor Ochoa, MD;  Location: [facility] OR;  Service: Vascular;  Laterality: N/A;  Resection and Grafting of Abdominal Aortic Aneurysm - Aortobi-Iliac   . Abdominal aortic aneurysm repair       Social History: History   Social History  . Marital Status: Married    Spouse Name: Nicole Cella    Number of Children: 4  . Years of Education: N/A   Occupational History  . Works PT as a Education administrator    Social History Main Topics  . Smoking status: Former Smoker -- 0.30 packs/day for 50 years    Types: Cigarettes  . Smokeless tobacco: Never Used     Comment: pt states that he does not smoke over 5 cigs per day  . Alcohol Use: No  . Drug Use:  No  . Sexual Activity: Not on file   Other Topics Concern  . Not on file   Social History Narrative   Married.  Lives with wife.  Ambulates without assistance.    Family History:  Family History  Problem Relation Age of Onset  . Diabetes Mother   . Heart disease Father   . Heart attack Father     Allergies: Niaspan  Meds: Prior to Admission medications   Medication Sig Start Date End Date Taking? Authorizing Provider  omeprazole (PRILOSEC) 40 MG capsule Take 1 capsule (40 mg total) by mouth 2 (two) times daily. 08/17/13  Yes Kathlen Mody, MD  albuterol (PROVENTIL  HFA;VENTOLIN HFA) 108 (90 BASE) MCG/ACT inhaler Inhale 2 puffs into the lungs every 6 (six) hours as needed for wheezing or shortness of breath. 08/18/13   Donita Brooks, MD  amLODipine (NORVASC) 10 MG tablet TAKE 1 TABLET BY MOUTH AT BEDTIME 09/04/13   Salley Scarlet, MD  atorvastatin (LIPITOR) 40 MG tablet TAKE 1 TABLET AT BEDTIME.STOP TAKING SIMVASTATIN 09/04/13   Salley Scarlet, MD  diltiazem (CARDIZEM CD) 360 MG 24 hr capsule Take 1 capsule (360 mg total) by mouth daily. 08/17/13   Kathlen Mody, MD  furosemide (LASIX) 40 MG tablet Take 1 tablet (40 mg total) by mouth daily. 09/04/13   Donita Brooks, MD  ipratropium (ATROVENT HFA) 17 MCG/ACT inhaler Inhale 2 puffs into the lungs every 6 (six) hours as needed for wheezing. 08/18/13   Donita Brooks, MD  losartan (COZAAR) 100 MG tablet TAKE 1 TABLET EVERY DAY (DOSAGE INCREASED) 09/04/13   Salley Scarlet, MD  metoprolol succinate (TOPROL-XL) 100 MG 24 hr tablet Take 1 tablet (100 mg total) by mouth daily. Take with or immediately following a meal. 08/17/13   Kathlen Mody, MD  warfarin (COUMADIN) 3 MG tablet Take as directed by coumadin clinic 08/26/13   Cassell Clement, MD    Physical Exam: Filed Vitals:   09/08/13 1145 09/08/13 1200 09/08/13 1215 09/08/13 1230  BP: 128/89 143/86 137/90 124/94  Pulse: 70     Temp:      TempSrc:      Resp: 16 15 19 16   Height:      Weight:      SpO2: 96%        Physical Exam: Blood pressure 124/94, pulse 70, temperature 97.6 F (36.4 C), temperature source Oral, resp. rate 16, height 5\' 6"  (1.676 m), weight 70.852 kg (156 lb 3.2 oz), SpO2 96.00%. Gen: No acute distress. Head: Normocephalic, atraumatic. Eyes: PERRL, EOMI, sclerae nonicteric. Mouth: Oropharynx clear. Neck: Supple, no thyromegaly, no lymphadenopathy, no jugular venous distention. Chest: Lungs with a few bibasilar rales. CV: Heart sounds are regular. No murmurs, rubs, or gallops. Abdomen: Soft, nontender, nondistended with  normal active bowel sounds. Extremities: Extremities are with 1+ edema. Skin: Warm and dry. Neuro: Alert and oriented times 3; cranial nerves II through XII grossly intact. Psych: Mood and affect normal.  Labs on Admission:  Basic Metabolic Panel:  Recent Labs Lab 09/08/13 1032  NA 140  K 4.2  CL 97  CO2 34*  GLUCOSE 137*  BUN 19  CREATININE 1.94*  CALCIUM 9.2   Liver Function Tests:  Recent Labs Lab 09/08/13 1032  AST 16  ALT 21  ALKPHOS 87  BILITOT 0.3  PROT 7.0  ALBUMIN 3.1*   CBC:  Recent Labs Lab 09/08/13 1032  WBC 4.7  HGB 13.0  HCT 38.6*  MCV 95.5  PLT 135*   Cardiac Enzymes:  Recent Labs Lab 09/08/13 1032  TROPONINI <0.30    BNP (last 3 results)  Recent Labs  09/08/13 1032  PROBNP 5402.0*   CBG: No results found for this basename: GLUCAP,  in the last 168 hours  Radiological Exams on Admission: Dg Chest 2 View  09/08/2013   CLINICAL DATA:  Dyspnea and weakness and recent pneumonia  EXAM: CHEST  2 VIEW  COMPARISON:  Chest x-ray dated August 14, 2013.  FINDINGS: The lungs are well-expanded. The pleural effusion on the left is increased slightly in size. Pleural fluid has developed on the right as well. There are coarse lung markings in the retrocardiac region on the lateral film data lobe may reflect a bibasilar pneumonia. The cardiopericardial silhouette is normal in size. The pulmonary vascularity is not engorged. The mediastinum is normal in width. The observed portions of the bony thorax exhibit no acute abnormalities.  IMPRESSION: The findings are worrisome for bibasilar pneumonia with small but increasing bilateral pleural effusions. There is no evidence of CHF.   Electronically Signed   By: David  [place]   On: 09/08/2013 11:06    EKG: Independently reviewed. Atrial fibrillation at 97 beats per minute.    Assessment/Plan Principal Problem:   Acute on chronic diastolic CHF (congestive heart failure) Patient was admitted and put on  60 mg IV twice daily. His heart failure appears to be from atrial fibrillation with rapid ventricular response. Last echocardiogram done on 08/13/2013 with EF of 55-60%. Cardiology consultation requested. Active Problems:   HTN (hypertension) Continue Cozaar, Cardizem and metoprolol. Continue higher dose Lasix.   COPD with exacerbation Duonebs and Solu-Medrol 80 mg IV every 12 hours.   Atrial fibrillation Will ask cardiology to evaluate. Continue rate controlling medications for now. May need DC cardioversion.   Long term (current) use of anticoagulants Continue Coumadin per pharmacy.   Stage III chronic kidney disease Renal function stable. Baseline creatinine 2.2-2.4. Current creatinine 1.9.   Acute-on-chronic respiratory failure Multi-factorial with COPD and CHF contributory. See above. Supplemental oxygen provided.  Code Status: Full. Family Communication: No family at bedside. Disposition Plan: Home when stable.  Time spent: 1 hour.  RAMA,CHRISTINA [facility] Pager [phone]  If 7PM-7AM, please contact night-coverage [email or web address] Password [email or web address] 09/08/2013, 1:21 PM

## 2013-09-09 DIAGNOSIS — I10 Essential (primary) hypertension: Secondary | ICD-10-CM

## 2013-09-09 LAB — BASIC METABOLIC PANEL
BUN: 25 mg/dL — ABNORMAL HIGH (ref 6–23)
CO2: 33 mEq/L — ABNORMAL HIGH (ref 19–32)
Calcium: 9 mg/dL (ref 8.4–10.5)
Chloride: 95 mEq/L — ABNORMAL LOW (ref 96–112)
GFR calc non Af Amer: 27 mL/min — ABNORMAL LOW (ref >90)
Glucose, Bld: 185 mg/dL — ABNORMAL HIGH (ref 70–99)
Potassium: 4.2 mEq/L (ref 3.5–5.1)
Sodium: 139 mEq/L (ref 135–145)

## 2013-09-09 MED ORDER — ALBUTEROL SULFATE (5 MG/ML) 0.5% IN NEBU: 2.5000 mg | INHALATION_SOLUTION | RESPIRATORY_TRACT | Status: DC | PRN

## 2013-09-09 MED ORDER — METHYLPREDNISOLONE SODIUM SUCC 40 MG IJ SOLR
40.0000 mg | Freq: Two times a day (BID) | INTRAMUSCULAR | Status: DC
  Administered 2013-09-09 – 2013-09-10 (×2): 40 mg via INTRAVENOUS
  Filled 2013-09-09 (×4): qty 1

## 2013-09-09 MED ORDER — DILTIAZEM HCL ER COATED BEADS 240 MG PO CP24
240.0000 mg | ORAL_CAPSULE | Freq: Every day | ORAL | Status: DC
  Administered 2013-09-09: 240 mg via ORAL
  Filled 2013-09-09 (×2): qty 1

## 2013-09-09 NOTE — Care Management Note (Addendum)
  Page 2 of 2   09/11/2013     12:12:59 PM   CARE MANAGEMENT NOTE 09/11/2013  Patient:  Mark [name], Mark [name]   Account Number:  [id number]  Date Initiated:  09/09/2013  Documentation initiated by:  Jatavia Keltner  Subjective/Objective Assessment:   Admitted with CHF     Action/Plan:   CM will continue to monitor for disposition needs.   Anticipated DC Date:  09/12/2013   Anticipated DC Plan:        DC Planning Services  CM consult  Medication Assistance      Choice offered to / List presented to:  C-1 Patient      DME agency  [facility]        Status of service:  Completed, signed off Medicare Important Message given?   (If response is "NO", the following Medicare IM given date fields will be blank) Date Medicare IM given:   Date Additional Medicare IM given:    Discharge Disposition:    Per UR Regulation:    If discussed at Long Length of Stay Meetings, dates discussed:    Comments:  09/11/2013  Rayce Brahmbhatt RN, BSN, MSHL, CCM 09/11/2013 PCP,  Dr. Lynnea Ferrier [address], [place] [address] Phone: [phone] Mobility:  Remains mobile and able to self manage all ADLs. CM will continue to monitor for disposition needs.  09/09/2013 Ontario Pettengill RN, BSN,  MSHL, CCM CM Consult:  Xarelto Xarelto 15mg  po daily with supper - first dose this evening Pharmacy:  [facility] [address] [place], [place] [address] [phone] CMA benefits request sent:  $30.00 co-pay 30 day supply CM provided patient with Xarelto Discount Card and instructed to activate card prior to pick up $5.00 co-pay. CM contacted [facility] and confirmed medication available. Other CM confirmed patient has home O2 2 l/m via  PRN use only. O2 provider:  [facility]. Patient has home scales  but not weighing daily.  Patient continues to need CHF self mgmt education. Tifini Reeder RN, BSN, [place], [place] 09/09/2013

## 2013-09-09 NOTE — Progress Notes (Signed)
Nutrition Brief Note  Patient identified on the Malnutrition Screening Tool (MST) Report. Pt with weight loss likely 2/2 fluid balance issues. Appetite great.  Wt Readings from Last 15 Encounters:  09/09/13 149 lb 7.6 oz (67.8 kg)  09/08/13 159 lb (72.122 kg)  09/04/13 169 lb (76.658 kg)  08/21/13 159 lb (72.122 kg)  08/15/13 151 lb 9.6 oz (68.765 kg)  08/11/13 146 lb (66.225 kg)  04/13/13 146 lb (66.225 kg)  01/07/13 150 lb (68.04 kg)  12/30/12 150 lb (68.04 kg)  10/28/12 159 lb (72.122 kg)  10/18/12 158 lb 9.6 oz (71.94 kg)  10/18/12 158 lb 9.6 oz (71.94 kg)  10/14/12 160 lb (72.576 kg)  10/13/12 158 lb (71.668 kg)  10/09/12 158 lb (71.668 kg)    Body mass index is 24.14 kg/(m^2). Patient meets criteria for normal weight based on current BMI.   Current diet order is Low Sodium, patient is consuming approximately 100% of meals at this time. Labs and medications reviewed.   No nutrition interventions warranted at this time. If nutrition issues arise, please consult RD.   Jarold Motto MS, RD, LDN Pager: [phone] After-hours pager: [phone]

## 2013-09-09 NOTE — Progress Notes (Signed)
Patient Name: Mark [name] Date of Encounter: 09/09/2013     Principal Problem:   Acute on chronic diastolic CHF (congestive heart failure) Active Problems:   HTN (hypertension)   COPD with exacerbation   Atrial fibrillation   Long term (current) use of anticoagulants   Stage III chronic kidney disease   Acute-on-chronic respiratory failure    SUBJECTIVE  No chest pain. No sputum production. Afebrile.  CURRENT MEDS . albuterol  2.5 mg Nebulization TID   And  . ipratropium  0.5 mg Nebulization TID  . amiodarone  200 mg Oral BID  . amLODipine  10 mg Oral Daily  . atorvastatin  40 mg Oral q1800  . diltiazem  360 mg Oral Daily  . furosemide  60 mg Intravenous BID  . losartan  100 mg Oral Daily  . methylPREDNISolone (SOLU-MEDROL) injection  80 mg Intravenous Q12H  . metoprolol succinate  100 mg Oral QPC breakfast  . pantoprazole  40 mg Oral Daily  . rivaroxaban  15 mg Oral Q supper  . sodium chloride  3 mL Intravenous Q12H  . sodium chloride  3 mL Intravenous Q12H    OBJECTIVE  Filed Vitals:   09/08/13 1629 09/08/13 1646 09/08/13 2053 09/09/13 0459  BP: 116/7  104/67 119/91  Pulse:   55 91  Temp:   98.2 F (36.8 C) 98.3 F (36.8 C)  TempSrc:   Oral Oral  Resp:   18 18  Height:      Weight:    149 lb 7.6 oz (67.8 kg)  SpO2:  97% 90% 91%    Intake/Output Summary (Last 24 hours) at 09/09/13 0744 Last data filed at 09/09/13 0510  Gross per 24 hour  Intake    360 ml  Output   1505 ml  Net  -1145 ml   Filed Weights   09/08/13 1016 09/08/13 1350 09/09/13 0459  Weight: 156 lb 3.2 oz (70.852 kg) 154 lb 1.6 oz (69.9 kg) 149 lb 7.6 oz (67.8 kg)    PHYSICAL EXAM General: Pleasant, NAD; nasal oxygen in place  Psych: Normal affect.  Neuro: Alert and oriented X 3. Moves all extremities spontaneously.  HEENT: Normal  Neck: Supple without bruits or JVD.  Lungs: Minimal rales. No acute respiratory distress.  Heart: RRR no s3, s4, or murmurs.  Abdomen: Soft,  non-tender, non-distended, BS + x 4.  Extremities: There is 1+ pretibial and pedal edema  Labs   Accessory Clinical Findings  CBC  Recent Labs  09/08/13 1032  WBC 4.7  HGB 13.0  HCT 38.6*  MCV 95.5  PLT 135*   Basic Metabolic Panel  Recent Labs  09/08/13 1032 09/09/13 0458  NA 140 139  K 4.2 4.2  CL 97 95*  CO2 34* 33*  GLUCOSE 137* 185*  BUN 19 25*  CREATININE 1.94* 2.31*  CALCIUM 9.2 9.0   Liver Function Tests  Recent Labs  09/08/13 1032  AST 16  ALT 21  ALKPHOS 87  BILITOT 0.3  PROT 7.0  ALBUMIN 3.1*   No results found for this basename: LIPASE, AMYLASE,  in the last 72 hours Cardiac Enzymes  Recent Labs  09/08/13 1032  TROPONINI <0.30   BNP No components found with this basename: POCBNP,  D-Dimer No results found for this basename: DDIMER,  in the last 72 hours Hemoglobin A1C No results found for this basename: HGBA1C,  in the last 72 hours Fasting Lipid Panel No results found for this basename: CHOL, HDL, LDLCALC,  TRIG, CHOLHDL, LDLDIRECT,  in the last 72 hours Thyroid Function Tests No results found for this basename: TSH, T4TOTAL, FREET3, T3FREE, THYROIDAB,  in the last 72 hours  TELE  Atrial fibrillation with slow VR  ECG    Radiology/Studies  Dg Chest 2 View  09/08/2013   CLINICAL DATA:  Dyspnea and weakness and recent pneumonia  EXAM: CHEST  2 VIEW  COMPARISON:  Chest x-ray dated August 14, 2013.  FINDINGS: The lungs are well-expanded. The pleural effusion on the left is increased slightly in size. Pleural fluid has developed on the right as well. There are coarse lung markings in the retrocardiac region on the lateral film data lobe may reflect a bibasilar pneumonia. The cardiopericardial silhouette is normal in size. The pulmonary vascularity is not engorged. The mediastinum is normal in width. The observed portions of the bony thorax exhibit no acute abnormalities.  IMPRESSION: The findings are worrisome for bibasilar  pneumonia with small but increasing bilateral pleural effusions. There is no evidence of CHF.   Electronically Signed   By: David  [place]   On: 09/08/2013 11:06   Dg Chest 2 View  08/14/2013   CLINICAL DATA:  Resolution of pneumonia.  EXAM: CHEST  2 VIEW  COMPARISON:  08/11/2013  FINDINGS: Heart, mediastinal, and hilar contours are stable hand within normal limits. Aeration at the left lung base is improving, but not completely clear. There is a small left basilar patchy opacity. A small left pleural effusion appears larger on today's chest radiograph compared to recent prior. The right lung appears clear. Previously described right middle lobe nodule on prior chest CT is not visible on this chest radiograph. Probable bilateral nipple shadows noted. No acute osseous abnormality.  IMPRESSION: 1. Faint left basilar opacity likely reflects residual airspace disease related to known pneumonia or aspiration. 2. Small left pleural effusion (likely parapneumonic), is increased in size from recent prior study.   Electronically Signed   By: Britta Mccreedy M.D.   On: 08/14/2013 18:24   Dg Chest 2 View  08/11/2013   CLINICAL DATA:  Shortness of breath.  EXAM: CHEST  2 VIEW  COMPARISON:  Chest radiograph 10/16/2012.  FINDINGS: Stable cardiac and mediastinal contours. Minimal heterogeneous opacities left lung base. 8 mm nodular density within the right lower lung. No definite pleural effusion or pneumothorax Regional skeleton is unremarkable.  IMPRESSION: 1. Heterogeneous opacities left lung base may represent infection in the appropriate clinical setting. Radiographic followup to ensure resolution is recommended. 2. 8 mm nodular density projecting over the right lower lung demonstrated on prior CT 10/10/2012. Follow-up as per prior CT report.   Electronically Signed   By: Annia Belt M.D.   On: 08/11/2013 18:06   Dg Ugi W/high Density W/kub  08/17/2013   CLINICAL DATA:  Dysphagia.  EXAM: UPPER GI SERIES W/HIGH DENSITY  W/KUB  TECHNIQUE: After obtaining a scout radiograph a routine upper GI series was performed using thin and high density barium.  COMPARISON:  None.  FLUOROSCOPY TIME:  3 min and 13 seconds  FINDINGS: Initial barium swallows demonstrate normal pharyngeal motion with swallowing. No laryngeal penetration or aspiration. No upper esophageal webs, strictures or diverticuli.  Nonspecific esophageal dysmotility with occasional disruption of the primary peristaltic wave. There is a small to moderate-sized hiatal hernia with thickened gastric folds in the hernia. No obvious ulceration. No mass or obstruction. The 13 mm barium pill did pass into the stomach. GE reflux is demonstrated.  The stomach, duodenum bulb and C-loop  are unremarkable. No ulceration or obstruction.  IMPRESSION: 1. Nonspecific esophageal dysmotility. 2. Small to moderate-sized hiatal hernia with thickening gastric folds in the hernia. GE reflux was demonstrated. 3. The stomach, duodenum bulb and C-loop are unremarkable.   Electronically Signed   By: Loralie Champagne M.D.   On: 08/17/2013 15:50    ASSESSMENT AND PLAN 1. persistent atrial fibrillation. Rate now slower. 2. acute on chronic diastolic CHF -- good diuresis overnight 3. COPD with exacerbation  4. chronic kidney disease  5. possible pneumonia versus pleural effusions from CHF  Plan: Continue current meds. Will cut back dose of diltiazem. Advance activity. Possibly home 1-2 days if stable.  Signed, Cassell Clement  MD

## 2013-09-09 NOTE — Plan of Care (Signed)
Problem: Food- and Nutrition-Related Knowledge Deficit (NB-1.1) Goal: Nutrition education Formal process to instruct or train a patient/client in a skill or to impart knowledge to help patients/clients voluntarily manage or modify food choices and eating behavior to maintain or improve health. Outcome: Completed/Met Date Met:  09/09/13  Nutrition Education Note  RD consulted for nutrition education regarding CHF. Dietary recall reveals frequent consumption of: hot dogs, country ham, biscuits, with frequent use of salt shaker.  RD provided "Low Sodium Nutrition Therapy" handout from the Academy of [employer]etics. Reviewed patient's dietary recall. Provided examples on ways to decrease sodium intake in diet. Discouraged intake of processed foods and use of salt shaker. Encouraged fresh fruits and vegetables as well as whole grain sources of carbohydrates to maximize fiber intake.   RD discussed why it is important for patient to adhere to diet recommendations, and emphasized the role of fluids, foods to avoid, and importance of weighing self daily. Teach back method used.  Expect poor compliance.  Body mass index is 24.14 kg/(m^2). Pt meets criteria for normal weight based on current BMI.  Current diet order is Low Sodium, patient is consuming approximately 100% of meals at this time. Labs and medications reviewed. No further nutrition interventions warranted at this time. RD contact information provided. If additional nutrition issues arise, please re-consult RD.   Jarold Motto MS, RD, LDN Pager: [phone] After-hours pager: [phone]

## 2013-09-09 NOTE — Progress Notes (Signed)
[facility] PROGRESS NOTE    Mark [name] WJX:[id number] DOB: 1945-10-10 DOA: 09/08/2013 PCP: Leo Grosser, MD  HPI/Brief narrative Mark [name] is an 67 y.o. male with a PMH of COPD, recent pneumonia, stage III CKD, CHF with preserved EF, atrial fibrillation who was sent to the ER for further evaluation of dyspnea and elevated heart rate. The patient reports worsening activity tolerance for the past 2 weeks. Denies fever/chills. Says he had lost 10 pounds over the last week with medicines prescribed by his doctor to "remove fluid around my lungs". He was recently hospitalized 08/11/2013-08/17/2013 with a COPD exacerbation and pneumonia. He was discharged on home oxygen, a prednisone taper and Levaquin. While in the hospital, he developed atrial fibrillation with RVR. Heart rate was controlled with metoprolol, Cardizem and he was started on Coumadin. He has seen his PCP regularly since discharge. On 09/04/2013, he was noted to have worsening edema by his PCP. He was started on Lasix 40 mg daily at that time and he was noted to have an elevated heart rate so his Toprol was increased. He was seen again in the office today, and his PCP felt he needs to be managed in the hospital for more aggressive diuresis and to achieve better heart rate control.  Assessment/Plan:  Principal Problem:  Acute on chronic diastolic CHF (congestive heart failure)  Patient was admitted and put on Lasix 60 mg IV twice daily. His heart failure appears to be from atrial fibrillation with rapid ventricular response. Last echocardiogram done on 08/13/2013 with EF of 55-60%. Cardiology consultation and followup appreciated. Improving. Good diuresis overnight. Continue Lasix.  Active Problems:  HTN (hypertension)  Continue Cozaar, Cardizem at reduced dose, amlodipine and metoprolol.  Controlled-soft blood pressures. Monitor closely   COPD with exacerbation  Duonebs and Solu-Medrol 80 mg IV every 12 hours.  His initial presentation was probably due to decompensated CHF. Reduced Solu-Medrol and consider discontinuing in a.m.  Atrial fibrillation  Continue rate controlling medications (diltiazem, metoprolol and low-dose amiodarone added) as above. Cardiology input appreciated. Cardiology has changed anticoagulation from warfarin to Xarelto and will consider OP cardioversion in 3 weeks.  Stage III chronic kidney disease  Renal function stable. Baseline creatinine 2.2-2.4. Monitor closely while being diuresed.  Acute-on-chronic respiratory failure  Multi-factorial with COPD and CHF contributory. See above. Supplemental oxygen provided. Improved.   Code Status: Full Family Communication: None at bedside Disposition Plan: Home when medically stable   Consultants:  Cardiology  Procedures:  None  Antibiotics:  None   Subjective: Patient states that he feels significantly better. Denies cough, chest pain or dyspnea. States that his leg swellings are improving.  Objective: Filed Vitals:   09/09/13 0920 09/09/13 1025 09/09/13 1235 09/09/13 1327  BP:  110/78 112/74 94/65  Pulse:  99 136 85  Temp:  98.4 F (36.9 C)  98.5 F (36.9 C)  TempSrc:    Oral  Resp:  18 18 18   Height:      Weight:      SpO2: 93% 94% 94% 94%    Intake/Output Summary (Last 24 hours) at 09/09/13 1533 Last data filed at 09/09/13 1326  Gross per 24 hour  Intake    840 ml  Output   1168 ml  Net   -328 ml   Filed Weights   09/08/13 1016 09/08/13 1350 09/09/13 0459  Weight: 70.852 kg (156 lb 3.2 oz) 69.9 kg (154 lb 1.6 oz) 67.8 kg (149 lb 7.6 oz)     Exam:  General exam: Middle-aged pleasant male lying comfortably supine in bed Respiratory system: Slightly reduced breath sounds in the bases but otherwise clear to auscultation. No increased work of breathing. Cardiovascular system: S1 & S2 heard, irregularly irregular. No JVD, murmurs, gallops, clicks. 2+ pitting bilateral leg edema to  knees.. Gastrointestinal system: Abdomen is nondistended, soft and nontender. Normal bowel sounds heard. Midline laparotomy scar. Central nervous system: Alert and oriented. No focal neurological deficits. Extremities: Symmetric 5 x 5 power.   Data Reviewed: Basic Metabolic Panel:  Recent Labs Lab 09/08/13 1032 09/09/13 0458  NA 140 139  K 4.2 4.2  CL 97 95*  CO2 34* 33*  GLUCOSE 137* 185*  BUN 19 25*  CREATININE 1.94* 2.31*  CALCIUM 9.2 9.0   Liver Function Tests:  Recent Labs Lab 09/08/13 1032  AST 16  ALT 21  ALKPHOS 87  BILITOT 0.3  PROT 7.0  ALBUMIN 3.1*   No results found for this basename: LIPASE, AMYLASE,  in the last 168 hours No results found for this basename: AMMONIA,  in the last 168 hours CBC:  Recent Labs Lab 09/08/13 1032  WBC 4.7  HGB 13.0  HCT 38.6*  MCV 95.5  PLT 135*   Cardiac Enzymes:  Recent Labs Lab 09/08/13 1032  TROPONINI <0.30   BNP (last 3 results)  Recent Labs  09/08/13 1032  PROBNP 5402.0*   CBG: No results found for this basename: GLUCAP,  in the last 168 hours  No results found for this or any previous visit (from the past 240 hour(s)).    Additional labs: 1. None     Studies: Dg Chest 2 View  09/08/2013   CLINICAL DATA:  Dyspnea and weakness and recent pneumonia  EXAM: CHEST  2 VIEW  COMPARISON:  Chest x-ray dated August 14, 2013.  FINDINGS: The lungs are well-expanded. The pleural effusion on the left is increased slightly in size. Pleural fluid has developed on the right as well. There are coarse lung markings in the retrocardiac region on the lateral film data lobe may reflect a bibasilar pneumonia. The cardiopericardial silhouette is normal in size. The pulmonary vascularity is not engorged. The mediastinum is normal in width. The observed portions of the bony thorax exhibit no acute abnormalities.  IMPRESSION: The findings are worrisome for bibasilar pneumonia with small but increasing bilateral pleural  effusions. There is no evidence of CHF.   Electronically Signed   By: David  [place]   On: 09/08/2013 11:06        Scheduled Meds: . albuterol  2.5 mg Nebulization TID   And  . ipratropium  0.5 mg Nebulization TID  . amiodarone  200 mg Oral BID  . amLODipine  10 mg Oral Daily  . atorvastatin  40 mg Oral q1800  . diltiazem  240 mg Oral Daily  . furosemide  60 mg Intravenous BID  . losartan  100 mg Oral Daily  . methylPREDNISolone (SOLU-MEDROL) injection  80 mg Intravenous Q12H  . metoprolol succinate  100 mg Oral QPC breakfast  . pantoprazole  40 mg Oral Daily  . rivaroxaban  15 mg Oral Q supper  . sodium chloride  3 mL Intravenous Q12H  . sodium chloride  3 mL Intravenous Q12H   Continuous Infusions:   Principal Problem:   Acute on chronic diastolic CHF (congestive heart failure) Active Problems:   HTN (hypertension)   COPD with exacerbation   Atrial fibrillation   Long term (current) use of anticoagulants   Stage III  chronic kidney disease   Acute-on-chronic respiratory failure    Time spent: 25 minutes    Darnell Jeschke, MD, FACP, FHM. [facility] Pager [phone]  If 7PM-7AM, please contact night-coverage [email or web address] Password [email or web address] 09/09/2013, 3:33 PM    LOS: 1 day

## 2013-09-10 ENCOUNTER — Inpatient Hospital Stay (HOSPITAL_COMMUNITY): Payer: Medicare Other

## 2013-09-10 DIAGNOSIS — N189 Chronic kidney disease, unspecified: Secondary | ICD-10-CM

## 2013-09-10 DIAGNOSIS — N179 Acute kidney failure, unspecified: Secondary | ICD-10-CM

## 2013-09-10 LAB — BASIC METABOLIC PANEL
BUN: 41 mg/dL — ABNORMAL HIGH (ref 6–23)
CO2: 32 mEq/L (ref 19–32)
Calcium: 8.9 mg/dL (ref 8.4–10.5)
Creatinine, Ser: 3.01 mg/dL — ABNORMAL HIGH (ref 0.50–1.35)
GFR calc Af Amer: 23 mL/min — ABNORMAL LOW (ref >90)
Glucose, Bld: 188 mg/dL — ABNORMAL HIGH (ref 70–99)
Sodium: 139 mEq/L (ref 135–145)

## 2013-09-10 LAB — CBC
HCT: 33.5 % — ABNORMAL LOW (ref 39.0–52.0)
Hemoglobin: 11.2 g/dL — ABNORMAL LOW (ref 13.0–17.0)
MCH: 31.6 pg (ref 26.0–34.0)
MCHC: 33.4 g/dL (ref 30.0–36.0)
MCV: 94.6 fL (ref 78.0–100.0)
Platelets: 188 10*3/uL (ref 150–400)
RBC: 3.54 MIL/uL — ABNORMAL LOW (ref 4.22–5.81)

## 2013-09-10 MED ORDER — FUROSEMIDE 40 MG PO TABS
40.0000 mg | ORAL_TABLET | Freq: Every day | ORAL | Status: DC
  Administered 2013-09-10: 09:00:00 40 mg via ORAL
  Filled 2013-09-10 (×2): qty 1

## 2013-09-10 MED ORDER — BIOTENE DRY MOUTH MT LIQD
15.0000 mL | Freq: Two times a day (BID) | OROMUCOSAL | Status: DC
  Administered 2013-09-10 – 2013-09-12 (×5): 15 mL via OROMUCOSAL

## 2013-09-10 MED ORDER — DILTIAZEM HCL ER COATED BEADS 300 MG PO CP24
300.0000 mg | ORAL_CAPSULE | Freq: Every day | ORAL | Status: DC
  Administered 2013-09-10 – 2013-09-12 (×3): 300 mg via ORAL
  Filled 2013-09-10 (×3): qty 1

## 2013-09-10 MED ORDER — LOSARTAN POTASSIUM 50 MG PO TABS
100.0000 mg | ORAL_TABLET | Freq: Every day | ORAL | Status: DC
  Filled 2013-09-10: qty 2

## 2013-09-10 MED ORDER — FUROSEMIDE 40 MG PO TABS
60.0000 mg | ORAL_TABLET | Freq: Every day | ORAL | Status: DC
  Filled 2013-09-10: qty 1

## 2013-09-10 NOTE — Progress Notes (Addendum)
[facility] PROGRESS NOTE    Mark [name] ZOX:[id number] DOB: 22-Dec-1945 DOA: 09/08/2013 PCP: Leo Grosser, MD  HPI/Brief narrative Mark [name] is an 67 y.o. male with a PMH of COPD, recent pneumonia, stage III CKD, CHF with preserved EF, atrial fibrillation who was sent to the ER for further evaluation of dyspnea and elevated heart rate. The patient reports worsening activity tolerance for the past 2 weeks. Denies fever/chills. Says he had lost 10 pounds over the last week with medicines prescribed by his doctor to "remove fluid around my lungs". He was recently hospitalized 08/11/2013-08/17/2013 with a COPD exacerbation and pneumonia. He was discharged on home oxygen, a prednisone taper and Levaquin. While in the hospital, he developed atrial fibrillation with RVR. Heart rate was controlled with metoprolol, Cardizem and he was started on Coumadin. He has seen his PCP regularly since discharge. On 09/04/2013, he was noted to have worsening edema by his PCP. He was started on Lasix 40 mg daily at that time and he was noted to have an elevated heart rate so his Toprol was increased. He was seen again in the office today, and his PCP felt he needs to be managed in the hospital for more aggressive diuresis and to achieve better heart rate control.  Assessment/Plan:  Principal Problem:  Acute on chronic diastolic CHF (congestive heart failure)  Patient was admitted and put on Lasix 60 mg IV twice daily. His heart failure appears to be from atrial fibrillation with rapid ventricular response. Last echocardiogram done on 08/13/2013 with EF of 55-60%. Cardiology consultation and followup appreciated. Continues to improve. We'll change Lasix to 40 mg orally daily due to bump in his creatinine.  Active Problems:  HTN (hypertension)  Continue Cozaar, Cardizem, and metoprolol.  Controlled. Cardizem dose being adjusted by cardiology for heart rate control. Cozaar held 12/25 secondary to  bump in creatinine. Due to leg edema and soft blood pressures, DC amlodipine.   COPD with exacerbation  Duonebs and Solu-Medrol 80 mg IV every 12 hours. His initial presentation was probably due to decompensated CHF. DC Solu-Medrol.  Atrial fibrillation  Continue rate controlling medications (diltiazem, metoprolol and low-dose amiodarone added) as above. Cardiology input appreciated. Cardiology has changed anticoagulation from warfarin to Xarelto and will consider OP cardioversion in 3 weeks. Cardizem dose increased by cardiology.  Acute on Stage III chronic kidney disease  Renal function stable. Baseline creatinine 2.2-2.4. Likely related to diuresis. Reduce and change Lasix to by mouth. Hold Cozaar for today. Follow BMP in a.m.  Acute-on-chronic respiratory failure  Multi-factorial with COPD and CHF contributory. See above. Supplemental oxygen provided. Improved.   Code Status: Full Family Communication: None at bedside Disposition Plan: Home when medically stable   Consultants:  Cardiology  Procedures:  None  Antibiotics:  None   Subjective: Patient denies dyspnea, chest pain or palpitations. Leg swellings decreasing.  Objective: Filed Vitals:   09/09/13 2010 09/09/13 2200 09/10/13 0426 09/10/13 0921  BP: 96/60 110/71 104/80 107/76  Pulse: 77 92 116 107  Temp: 98.3 F (36.8 C)  97.7 F (36.5 C) 97.9 F (36.6 C)  TempSrc: Oral  Oral Oral  Resp: 18  18 18   Height:      Weight:   67.903 kg (149 lb 11.2 oz)   SpO2: 93%  93% 96%    Intake/Output Summary (Last 24 hours) at 09/10/13 1203 Last data filed at 09/10/13 0937  Gross per 24 hour  Intake    963 ml  Output  1003 ml  Net    -40 ml   Filed Weights   09/08/13 1350 09/09/13 0459 09/10/13 0426  Weight: 69.9 kg (154 lb 1.6 oz) 67.8 kg (149 lb 7.6 oz) 67.903 kg (149 lb 11.2 oz)     Exam:  General exam: Middle-aged pleasant male lying comfortably supine in bed Respiratory system: Slightly reduced breath  sounds in the bases but otherwise clear to auscultation. No increased work of breathing. Cardiovascular system: S1 & S2 heard, irregularly irregular. No JVD, murmurs, gallops, clicks. 1+ pitting bilateral leg edema to knees. Telemetry: A. fib in the 100s. Gastrointestinal system: Abdomen is nondistended, soft and nontender. Normal bowel sounds heard. Midline laparotomy scar. Central nervous system: Alert and oriented. No focal neurological deficits. Extremities: Symmetric 5 x 5 power.   Data Reviewed: Basic Metabolic Panel:  Recent Labs Lab 09/08/13 1032 09/09/13 0458 09/10/13 0443  NA 140 139 139  K 4.2 4.2 5.1  CL 97 95* 96  CO2 34* 33* 32  GLUCOSE 137* 185* 188*  BUN 19 25* 41*  CREATININE 1.94* 2.31* 3.01*  CALCIUM 9.2 9.0 8.9   Liver Function Tests:  Recent Labs Lab 09/08/13 1032  AST 16  ALT 21  ALKPHOS 87  BILITOT 0.3  PROT 7.0  ALBUMIN 3.1*   No results found for this basename: LIPASE, AMYLASE,  in the last 168 hours No results found for this basename: AMMONIA,  in the last 168 hours CBC:  Recent Labs Lab 09/08/13 1032 09/10/13 0443  WBC 4.7 8.6  HGB 13.0 11.2*  HCT 38.6* 33.5*  MCV 95.5 94.6  PLT 135* 188   Cardiac Enzymes:  Recent Labs Lab 09/08/13 1032  TROPONINI <0.30   BNP (last 3 results)  Recent Labs  09/08/13 1032  PROBNP 5402.0*   CBG: No results found for this basename: GLUCAP,  in the last 168 hours  No results found for this or any previous visit (from the past 240 hour(s)).    Additional labs: 1. None     Studies: Dg Chest 2 View  09/10/2013   CLINICAL DATA:  Short of breath.  EXAM: CHEST  2 VIEW  COMPARISON:  09/08/2013  FINDINGS: Lungs are hyperexpanded. There are small bilateral pleural effusions, mildly decreased on the left since the prior study, with mild associated lung base reticular opacity, likely subsegmental atelectasis. There is no pulmonary edema. There is no convincing infiltrate.  Cardiac silhouette is  normal in size. The mediastinum is normal in contour.  No pneumothorax.  IMPRESSION: Mild decrease in the small left pleural effusions since the prior study. No other change. No pulmonary edema is evident. Lung hyperexpansion suggests COPD.   Electronically Signed   By: Amie Portland M.D.   On: 09/10/2013 08:20        Scheduled Meds: . amiodarone  200 mg Oral BID  . amLODipine  10 mg Oral Daily  . antiseptic oral rinse  15 mL Mouth Rinse BID  . atorvastatin  40 mg Oral q1800  . diltiazem  300 mg Oral Daily  . furosemide  40 mg Oral Daily  . [START ON 09/11/2013] losartan  100 mg Oral Daily  . methylPREDNISolone (SOLU-MEDROL) injection  40 mg Intravenous Q12H  . metoprolol succinate  100 mg Oral QPC breakfast  . pantoprazole  40 mg Oral Daily  . rivaroxaban  15 mg Oral Q supper  . sodium chloride  3 mL Intravenous Q12H   Continuous Infusions:   Principal Problem:   Acute on  chronic diastolic CHF (congestive heart failure) Active Problems:   HTN (hypertension)   COPD with exacerbation   Atrial fibrillation   Long term (current) use of anticoagulants   Stage III chronic kidney disease   Acute-on-chronic respiratory failure    Time spent: 25 minutes    HONGALGI,ANAND, MD, FACP, FHM. [facility] Pager [phone]  If 7PM-7AM, please contact night-coverage [email or web address] Password [email or web address] 09/10/2013, 12:03 PM    LOS: 2 days

## 2013-09-10 NOTE — Progress Notes (Signed)
Patient Name: Mark [name] Date of Encounter: 09/10/2013     Principal Problem:   Acute on chronic diastolic CHF (congestive heart failure) Active Problems:   HTN (hypertension)   COPD with exacerbation   Atrial fibrillation   Long term (current) use of anticoagulants   Stage III chronic kidney disease   Acute-on-chronic respiratory failure    SUBJECTIVE  No chest pain. No sputum production. Afebrile.Repeat chest xray pending.  CURRENT MEDS . amiodarone  200 mg Oral BID  . amLODipine  10 mg Oral Daily  . atorvastatin  40 mg Oral q1800  . diltiazem  240 mg Oral Daily  . furosemide  60 mg Oral Daily  . [START ON 09/11/2013] losartan  100 mg Oral Daily  . methylPREDNISolone (SOLU-MEDROL) injection  40 mg Intravenous Q12H  . metoprolol succinate  100 mg Oral QPC breakfast  . pantoprazole  40 mg Oral Daily  . rivaroxaban  15 mg Oral Q supper  . sodium chloride  3 mL Intravenous Q12H    OBJECTIVE  Filed Vitals:   09/09/13 1813 09/09/13 2010 09/09/13 2200 09/10/13 0426  BP: 98/67 96/60 110/71 104/80  Pulse: 105 77 92 116  Temp: 98.1 F (36.7 C) 98.3 F (36.8 C)  97.7 F (36.5 C)  TempSrc:  Oral  Oral  Resp: 18 18  18   Height:      Weight:    149 lb 11.2 oz (67.903 kg)  SpO2: 92% 93%  93%    Intake/Output Summary (Last 24 hours) at 09/10/13 0825 Last data filed at 09/10/13 0426  Gross per 24 hour  Intake    720 ml  Output    653 ml  Net     67 ml   Filed Weights   09/08/13 1350 09/09/13 0459 09/10/13 0426  Weight: 154 lb 1.6 oz (69.9 kg) 149 lb 7.6 oz (67.8 kg) 149 lb 11.2 oz (67.903 kg)    PHYSICAL EXAM General: Pleasant, NAD;  Psych: Normal affect.  Neuro: Alert and oriented X 3. Moves all extremities spontaneously.  HEENT: Normal  Neck: Supple without bruits or JVD.  Lungs: Distant breath sounds. No rales or wheezing. Heart: Atrial fib. no s3, s4, or murmurs.  Abdomen: Soft, non-tender, non-distended, BS + x 4.  Extremities: There is 1+  pretibial and pedal edema  Labs   Accessory Clinical Findings  CBC  Recent Labs  09/08/13 1032 09/10/13 0443  WBC 4.7 8.6  HGB 13.0 11.2*  HCT 38.6* 33.5*  MCV 95.5 94.6  PLT 135* 188   Basic Metabolic Panel  Recent Labs  09/09/13 0458 09/10/13 0443  NA 139 139  K 4.2 5.1  CL 95* 96  CO2 33* 32  GLUCOSE 185* 188*  BUN 25* 41*  CREATININE 2.31* 3.01*  CALCIUM 9.0 8.9   Liver Function Tests  Recent Labs  09/08/13 1032  AST 16  ALT 21  ALKPHOS 87  BILITOT 0.3  PROT 7.0  ALBUMIN 3.1*   No results found for this basename: LIPASE, AMYLASE,  in the last 72 hours Cardiac Enzymes  Recent Labs  09/08/13 1032  TROPONINI <0.30   BNP No components found with this basename: POCBNP,  D-Dimer No results found for this basename: DDIMER,  in the last 72 hours Hemoglobin A1C No results found for this basename: HGBA1C,  in the last 72 hours Fasting Lipid Panel No results found for this basename: CHOL, HDL, LDLCALC, TRIG, CHOLHDL, LDLDIRECT,  in the last 72 hours Thyroid Function  Tests No results found for this basename: TSH, T4TOTAL, FREET3, T3FREE, THYROIDAB,  in the last 72 hours  TELE  Atrial fibrillation with rapid VR at times.  ECG    Radiology/Studies  Dg Chest 2 View  09/08/2013   CLINICAL DATA:  Dyspnea and weakness and recent pneumonia  EXAM: CHEST  2 VIEW  COMPARISON:  Chest x-ray dated August 14, 2013.  FINDINGS: The lungs are well-expanded. The pleural effusion on the left is increased slightly in size. Pleural fluid has developed on the right as well. There are coarse lung markings in the retrocardiac region on the lateral film data lobe may reflect a bibasilar pneumonia. The cardiopericardial silhouette is normal in size. The pulmonary vascularity is not engorged. The mediastinum is normal in width. The observed portions of the bony thorax exhibit no acute abnormalities.  IMPRESSION: The findings are worrisome for bibasilar pneumonia with small  but increasing bilateral pleural effusions. There is no evidence of CHF.   Electronically Signed   By: David  [place]   On: 09/08/2013 11:06   Dg Chest 2 View  08/14/2013   CLINICAL DATA:  Resolution of pneumonia.  EXAM: CHEST  2 VIEW  COMPARISON:  08/11/2013  FINDINGS: Heart, mediastinal, and hilar contours are stable hand within normal limits. Aeration at the left lung base is improving, but not completely clear. There is a small left basilar patchy opacity. A small left pleural effusion appears larger on today's chest radiograph compared to recent prior. The right lung appears clear. Previously described right middle lobe nodule on prior chest CT is not visible on this chest radiograph. Probable bilateral nipple shadows noted. No acute osseous abnormality.  IMPRESSION: 1. Faint left basilar opacity likely reflects residual airspace disease related to known pneumonia or aspiration. 2. Small left pleural effusion (likely parapneumonic), is increased in size from recent prior study.   Electronically Signed   By: Britta Mccreedy M.D.   On: 08/14/2013 18:24   Dg Chest 2 View  08/11/2013   CLINICAL DATA:  Shortness of breath.  EXAM: CHEST  2 VIEW  COMPARISON:  Chest radiograph 10/16/2012.  FINDINGS: Stable cardiac and mediastinal contours. Minimal heterogeneous opacities left lung base. 8 mm nodular density within the right lower lung. No definite pleural effusion or pneumothorax Regional skeleton is unremarkable.  IMPRESSION: 1. Heterogeneous opacities left lung base may represent infection in the appropriate clinical setting. Radiographic followup to ensure resolution is recommended. 2. 8 mm nodular density projecting over the right lower lung demonstrated on prior CT 10/10/2012. Follow-up as per prior CT report.   Electronically Signed   By: Annia Belt M.D.   On: 08/11/2013 18:06   Dg Ugi W/high Density W/kub  08/17/2013   CLINICAL DATA:  Dysphagia.  EXAM: UPPER GI SERIES W/HIGH DENSITY W/KUB  TECHNIQUE:  After obtaining a scout radiograph a routine upper GI series was performed using thin and high density barium.  COMPARISON:  None.  FLUOROSCOPY TIME:  3 min and 13 seconds  FINDINGS: Initial barium swallows demonstrate normal pharyngeal motion with swallowing. No laryngeal penetration or aspiration. No upper esophageal webs, strictures or diverticuli.  Nonspecific esophageal dysmotility with occasional disruption of the primary peristaltic wave. There is a small to moderate-sized hiatal hernia with thickened gastric folds in the hernia. No obvious ulceration. No mass or obstruction. The 13 mm barium pill did pass into the stomach. GE reflux is demonstrated.  The stomach, duodenum bulb and C-loop are unremarkable. No ulceration or obstruction.  IMPRESSION: 1.  Nonspecific esophageal dysmotility. 2. Small to moderate-sized hiatal hernia with thickening gastric folds in the hernia. GE reflux was demonstrated. 3. The stomach, duodenum bulb and C-loop are unremarkable.   Electronically Signed   By: Loralie Champagne M.D.   On: 08/17/2013 15:50    ASSESSMENT AND PLAN 1. persistent atrial fibrillation.  2. acute on chronic diastolic CHF -- good diuresis. Weight stable 3. COPD with exacerbation  4. chronic kidney disease  5. possible pneumonia versus pleural effusions from CHF  Plan: Will increase diltiazem back to 300 mg daily for rate control. Will reduce lasix to home dose 40 mg daily in view of worsening creatinine. Okay for discharge from cardiac standpoint with close outpatient followup.  Signed, Cassell Clement  MD

## 2013-09-11 LAB — CBC
HCT: 32.2 % — ABNORMAL LOW (ref 39.0–52.0)
Hemoglobin: 10.5 g/dL — ABNORMAL LOW (ref 13.0–17.0)
MCH: 31.2 pg (ref 26.0–34.0)
MCHC: 32.6 g/dL (ref 30.0–36.0)
RDW: 13.8 % (ref 11.5–15.5)
WBC: 9.4 10*3/uL (ref 4.0–10.5)

## 2013-09-11 LAB — BASIC METABOLIC PANEL
BUN: 57 mg/dL — ABNORMAL HIGH (ref 6–23)
Calcium: 8.3 mg/dL — ABNORMAL LOW (ref 8.4–10.5)
Chloride: 97 mEq/L (ref 96–112)
Creatinine, Ser: 3.49 mg/dL — ABNORMAL HIGH (ref 0.50–1.35)
GFR calc non Af Amer: 16 mL/min — ABNORMAL LOW (ref >90)
Glucose, Bld: 180 mg/dL — ABNORMAL HIGH (ref 70–99)
Potassium: 4.6 mEq/L (ref 3.5–5.1)

## 2013-09-11 MED ORDER — METOPROLOL SUCCINATE ER 50 MG PO TB24
50.0000 mg | ORAL_TABLET | Freq: Every day | ORAL | Status: DC
  Administered 2013-09-11 – 2013-09-12 (×2): 50 mg via ORAL
  Filled 2013-09-11 (×3): qty 1

## 2013-09-11 MED ORDER — FUROSEMIDE 40 MG PO TABS: 40.0000 mg | ORAL_TABLET | Freq: Every day | ORAL | Status: DC

## 2013-09-11 NOTE — Progress Notes (Signed)
[facility] PROGRESS NOTE    Mark [name] WUJ:[id number] DOB: Apr 22, 1946 DOA: 09/08/2013 PCP: Leo Grosser, MD  HPI/Brief narrative Mark [name] is an 67 y.o. male with a PMH of COPD, recent pneumonia, stage III CKD, CHF with preserved EF, atrial fibrillation who was sent to the ER for further evaluation of dyspnea and elevated heart rate. The patient reports worsening activity tolerance for the past 2 weeks. Denies fever/chills. Says he had lost 10 pounds over the last week with medicines prescribed by his doctor to "remove fluid around my lungs". He was recently hospitalized 08/11/2013-08/17/2013 with a COPD exacerbation and pneumonia. He was discharged on home oxygen, a prednisone taper and Levaquin. While in the hospital, he developed atrial fibrillation with RVR. Heart rate was controlled with metoprolol, Cardizem and he was started on Coumadin. He has seen his PCP regularly since discharge. On 09/04/2013, he was noted to have worsening edema by his PCP. He was started on Lasix 40 mg daily at that time and he was noted to have an elevated heart rate so his Toprol was increased. He was seen again in the office today, and his PCP felt he needs to be managed in the hospital for more aggressive diuresis and to achieve better heart rate control.  Assessment/Plan:  Principal Problem:  Acute on chronic diastolic CHF (congestive heart failure)  Patient was admitted and put on Lasix 60 mg IV twice daily. His heart failure appears to be from atrial fibrillation with rapid ventricular response. Last echocardiogram done on 08/13/2013 with EF of 55-60%. Cardiology consultation and followup appreciated. Patient improved on IV Lasix. Strict intake output and daily weights do not seem to be accurately recorded. His creatinine started to rise. Lasix was changed to by mouth at a reduced dose on 12/25. Despite this, creatinine has peaked at 3.49 today. Lasix held. Cozaar discontinued for now.  Follow BMP in a.m.  Active Problems:  HTN (hypertension)  Continue Cardizem, and metoprolol (dose reduced due to relative hypotension).  Controlled. Cardizem dose being adjusted by cardiology for heart rate control. Cozaar temporarily discontinued secondary to worsening renal failure. Due to leg edema and soft blood pressures, DC'ed amlodipine.   COPD with exacerbation  Duonebs and Solu-Medrol 80 mg IV every 12 hours. His initial presentation was probably due to decompensated CHF. DC'ed Solu-Medrol. Stable.  Atrial fibrillation  Continue rate controlling medications (diltiazem, metoprolol and low-dose amiodarone added) as above. Cardiology input appreciated. Cardiology has changed anticoagulation from warfarin to Xarelto and will consider OP cardioversion in 3 weeks. Cardizem dose increased by cardiology.  Acute on Stage III chronic kidney disease  Baseline creatinine 2.2-2.4. Her renal functions are declining despite changing Lasix to reduce dose by mouth and holding ARB yesterday. Discontinued Lasix and ARB today. Ancient encouraged to increase by mouth fluid intake. Follow BMP in a.m. Likely secondary to over diuresis and hemodynamics. No hydronephrosis by renal ultrasound on 10/06/12  Acute-on-chronic respiratory failure  Multi-factorial with COPD and CHF contributory. See above. Supplemental oxygen provided. Improved.  Abdominal aortic aneurysm with intraluminal clot - Seen on renal ultrasound 10/06/12. Says 7.1 x 6.8 cm. Continue anticoagulation. Outpatient followup   Code Status: Full Family Communication: None at bedside Disposition Plan: Home when medically stable and creatinine start showing a downward trend.   Consultants:  Cardiology  Procedures:  None  Antibiotics:  None   Subjective: Patient denies dyspnea, chest pain or palpitations. Leg swellings decreasing.  Objective: Filed Vitals:   09/10/13 1300 09/10/13 1835 09/10/13 1946  09/11/13 1046  BP: 128/78  108/64 100/70 114/84  Pulse: 91 90 109 93  Temp: 97.1 F (36.2 C)  98.2 F (36.8 C)   TempSrc: Oral  Oral   Resp: 18 20 20    Height:      Weight:      SpO2: 92% 93% 93%     Intake/Output Summary (Last 24 hours) at 09/11/13 1215 Last data filed at 09/11/13 1125  Gross per 24 hour  Intake   1560 ml  Output    775 ml  Net    785 ml   Filed Weights   09/08/13 1350 09/09/13 0459 09/10/13 0426  Weight: 69.9 kg (154 lb 1.6 oz) 67.8 kg (149 lb 7.6 oz) 67.903 kg (149 lb 11.2 oz)     Exam:  General exam: Middle-aged pleasant male lying comfortably supine in bed Respiratory system: clear to auscultation. No increased work of breathing. Cardiovascular system: S1 & S2 heard, irregularly irregular. No JVD, murmurs, gallops, clicks. 1+ pitting bilateral leg edema to knees. Telemetry: A. fib in the 90's-100s. Gastrointestinal system: Abdomen is nondistended, soft and nontender. Normal bowel sounds heard. Midline laparotomy scar. Central nervous system: Alert and oriented. No focal neurological deficits. Extremities: Symmetric 5 x 5 power.   Data Reviewed: Basic Metabolic Panel:  Recent Labs Lab 09/08/13 1032 09/09/13 0458 09/10/13 0443 09/11/13 0510  NA 140 139 139 141  K 4.2 4.2 5.1 4.6  CL 97 95* 96 97  CO2 34* 33* 32 32  GLUCOSE 137* 185* 188* 180*  BUN 19 25* 41* 57*  CREATININE 1.94* 2.31* 3.01* 3.49*  CALCIUM 9.2 9.0 8.9 8.3*   Liver Function Tests:  Recent Labs Lab 09/08/13 1032  AST 16  ALT 21  ALKPHOS 87  BILITOT 0.3  PROT 7.0  ALBUMIN 3.1*   No results found for this basename: LIPASE, AMYLASE,  in the last 168 hours No results found for this basename: AMMONIA,  in the last 168 hours CBC:  Recent Labs Lab 09/08/13 1032 09/10/13 0443 09/11/13 0510  WBC 4.7 8.6 9.4  HGB 13.0 11.2* 10.5*  HCT 38.6* 33.5* 32.2*  MCV 95.5 94.6 95.5  PLT 135* 188 217   Cardiac Enzymes:  Recent Labs Lab 09/08/13 1032  TROPONINI <0.30   BNP (last 3  results)  Recent Labs  09/08/13 1032  PROBNP 5402.0*   CBG: No results found for this basename: GLUCAP,  in the last 168 hours  No results found for this or any previous visit (from the past 240 hour(s)).    Additional labs: 1. None     Studies: Dg Chest 2 View  09/10/2013   CLINICAL DATA:  Short of breath.  EXAM: CHEST  2 VIEW  COMPARISON:  09/08/2013  FINDINGS: Lungs are hyperexpanded. There are small bilateral pleural effusions, mildly decreased on the left since the prior study, with mild associated lung base reticular opacity, likely subsegmental atelectasis. There is no pulmonary edema. There is no convincing infiltrate.  Cardiac silhouette is normal in size. The mediastinum is normal in contour.  No pneumothorax.  IMPRESSION: Mild decrease in the small left pleural effusions since the prior study. No other change. No pulmonary edema is evident. Lung hyperexpansion suggests COPD.   Electronically Signed   By: Amie Portland M.D.   On: 09/10/2013 08:20        Scheduled Meds: . amiodarone  200 mg Oral BID  . antiseptic oral rinse  15 mL Mouth Rinse BID  . atorvastatin  40 mg Oral q1800  . diltiazem  300 mg Oral Daily  . metoprolol succinate  50 mg Oral QPC breakfast  . pantoprazole  40 mg Oral Daily  . rivaroxaban  15 mg Oral Q supper  . sodium chloride  3 mL Intravenous Q12H   Continuous Infusions:   Principal Problem:   Acute on chronic diastolic CHF (congestive heart failure) Active Problems:   HTN (hypertension)   COPD with exacerbation   Atrial fibrillation   Long term (current) use of anticoagulants   Stage III chronic kidney disease   Acute-on-chronic respiratory failure    Time spent: 25 minutes    Tehilla Coffel, MD, FACP, FHM. [facility] Pager [phone]  If 7PM-7AM, please contact night-coverage [email or web address] Password [email or web address] 09/11/2013, 12:15 PM    LOS: 3 days

## 2013-09-11 NOTE — Progress Notes (Signed)
Patient Name: Mark [name] Date of Encounter: 09/11/2013     Principal Problem:   Acute on chronic diastolic CHF (congestive heart failure) Active Problems:   HTN (hypertension)   COPD with exacerbation   Atrial fibrillation   Long term (current) use of anticoagulants   Stage III chronic kidney disease   Acute-on-chronic respiratory failure    SUBJECTIVE   the patient denies any chest pain or significant dyspnea this morning.  His renal function is worse.  His blood pressure is soft this morning.  CURRENT MEDS . amiodarone  200 mg Oral BID  . antiseptic oral rinse  15 mL Mouth Rinse BID  . atorvastatin  40 mg Oral q1800  . diltiazem  300 mg Oral Daily  . metoprolol succinate  50 mg Oral QPC breakfast  . pantoprazole  40 mg Oral Daily  . rivaroxaban  15 mg Oral Q supper  . sodium chloride  3 mL Intravenous Q12H    OBJECTIVE  Filed Vitals:   09/10/13 0921 09/10/13 1300 09/10/13 1835 09/10/13 1946  BP: 107/76 128/78 108/64 100/70  Pulse: 107 91 90 109  Temp: 97.9 F (36.6 C) 97.1 F (36.2 C)  98.2 F (36.8 C)  TempSrc: Oral Oral  Oral  Resp: 18 18 20 20   Height:      Weight:      SpO2: 96% 92% 93% 93%    Intake/Output Summary (Last 24 hours) at 09/11/13 0841 Last data filed at 09/11/13 0818  Gross per 24 hour  Intake   1803 ml  Output    800 ml  Net   1003 ml   Filed Weights   09/08/13 1350 09/09/13 0459 09/10/13 0426  Weight: 154 lb 1.6 oz (69.9 kg) 149 lb 7.6 oz (67.8 kg) 149 lb 11.2 oz (67.903 kg)    PHYSICAL EXAM General: Pleasant, NAD;  Psych: Normal affect.  Neuro: Alert and oriented X 3. Moves all extremities spontaneously.  HEENT: Normal  Neck: Supple without bruits or JVD.  Lungs: Distant breath sounds. No rales or wheezing. Heart: Atrial fib. no s3, s4, or murmurs.  Abdomen: Soft, non-tender, non-distended, BS + x 4.  Extremities: There is 1+ pretibial and pedal edema  Labs   Accessory Clinical Findings  CBC  Recent Labs  09/10/13 0443 09/11/13 0510  WBC 8.6 9.4  HGB 11.2* 10.5*  HCT 33.5* 32.2*  MCV 94.6 95.5  PLT 188 217   Basic Metabolic Panel  Recent Labs  09/10/13 0443 09/11/13 0510  NA 139 141  K 5.1 4.6  CL 96 97  CO2 32 32  GLUCOSE 188* 180*  BUN 41* 57*  CREATININE 3.01* 3.49*  CALCIUM 8.9 8.3*   Liver Function Tests  Recent Labs  09/08/13 1032  AST 16  ALT 21  ALKPHOS 87  BILITOT 0.3  PROT 7.0  ALBUMIN 3.1*   No results found for this basename: LIPASE, AMYLASE,  in the last 72 hours Cardiac Enzymes  Recent Labs  09/08/13 1032  TROPONINI <0.30   BNP No components found with this basename: POCBNP,  D-Dimer No results found for this basename: DDIMER,  in the last 72 hours Hemoglobin A1C No results found for this basename: HGBA1C,  in the last 72 hours Fasting Lipid Panel No results found for this basename: CHOL, HDL, LDLCALC, TRIG, CHOLHDL, LDLDIRECT,  in the last 72 hours Thyroid Function Tests No results found for this basename: TSH, T4TOTAL, FREET3, T3FREE, THYROIDAB,  in the last 72 hours  TELE  Atrial fibrillation with controlled ventricular response.  ECG    Radiology/Studies  Dg Chest 2 View  09/08/2013   CLINICAL DATA:  Dyspnea and weakness and recent pneumonia  EXAM: CHEST  2 VIEW  COMPARISON:  Chest x-ray dated August 14, 2013.  FINDINGS: The lungs are well-expanded. The pleural effusion on the left is increased slightly in size. Pleural fluid has developed on the right as well. There are coarse lung markings in the retrocardiac region on the lateral film data lobe may reflect a bibasilar pneumonia. The cardiopericardial silhouette is normal in size. The pulmonary vascularity is not engorged. The mediastinum is normal in width. The observed portions of the bony thorax exhibit no acute abnormalities.  IMPRESSION: The findings are worrisome for bibasilar pneumonia with small but increasing bilateral pleural effusions. There is no evidence of CHF.    Electronically Signed   By: David  [place]   On: 09/08/2013 11:06   Dg Chest 2 View  08/14/2013   CLINICAL DATA:  Resolution of pneumonia.  EXAM: CHEST  2 VIEW  COMPARISON:  08/11/2013  FINDINGS: Heart, mediastinal, and hilar contours are stable hand within normal limits. Aeration at the left lung base is improving, but not completely clear. There is a small left basilar patchy opacity. A small left pleural effusion appears larger on today's chest radiograph compared to recent prior. The right lung appears clear. Previously described right middle lobe nodule on prior chest CT is not visible on this chest radiograph. Probable bilateral nipple shadows noted. No acute osseous abnormality.  IMPRESSION: 1. Faint left basilar opacity likely reflects residual airspace disease related to known pneumonia or aspiration. 2. Small left pleural effusion (likely parapneumonic), is increased in size from recent prior study.   Electronically Signed   By: Britta Mccreedy M.D.   On: 08/14/2013 18:24   Dg Chest 2 View  08/11/2013   CLINICAL DATA:  Shortness of breath.  EXAM: CHEST  2 VIEW  COMPARISON:  Chest radiograph 10/16/2012.  FINDINGS: Stable cardiac and mediastinal contours. Minimal heterogeneous opacities left lung base. 8 mm nodular density within the right lower lung. No definite pleural effusion or pneumothorax Regional skeleton is unremarkable.  IMPRESSION: 1. Heterogeneous opacities left lung base may represent infection in the appropriate clinical setting. Radiographic followup to ensure resolution is recommended. 2. 8 mm nodular density projecting over the right lower lung demonstrated on prior CT 10/10/2012. Follow-up as per prior CT report.   Electronically Signed   By: Annia Belt M.D.   On: 08/11/2013 18:06   Dg Ugi W/high Density W/kub  08/17/2013   CLINICAL DATA:  Dysphagia.  EXAM: UPPER GI SERIES W/HIGH DENSITY W/KUB  TECHNIQUE: After obtaining a scout radiograph a routine upper GI series was performed  using thin and high density barium.  COMPARISON:  None.  FLUOROSCOPY TIME:  3 min and 13 seconds  FINDINGS: Initial barium swallows demonstrate normal pharyngeal motion with swallowing. No laryngeal penetration or aspiration. No upper esophageal webs, strictures or diverticuli.  Nonspecific esophageal dysmotility with occasional disruption of the primary peristaltic wave. There is a small to moderate-sized hiatal hernia with thickened gastric folds in the hernia. No obvious ulceration. No mass or obstruction. The 13 mm barium pill did pass into the stomach. GE reflux is demonstrated.  The stomach, duodenum bulb and C-loop are unremarkable. No ulceration or obstruction.  IMPRESSION: 1. Nonspecific esophageal dysmotility. 2. Small to moderate-sized hiatal hernia with thickening gastric folds in the hernia. GE reflux was demonstrated.  3. The stomach, duodenum bulb and C-loop are unremarkable.   Electronically Signed   By: Loralie Champagne M.D.   On: 08/17/2013 15:50    ASSESSMENT AND PLAN 1. persistent atrial fibrillation.  2. acute on chronic diastolic CHF  3. COPD with exacerbation  4. chronic kidney disease.  Renal function worse since aggressive diuresis.    Plan: I have stopped Lasix altogether at this point to allow kidneys to recover.  I've asked him to increase his intake of fluids today.  His ARB is on hold and his amlodipine has been stopped.  Continue rate control and Xarelto for his atrial fibrillation.  Anticipate outpatient cardioversion in about 3 weeks. Cassell Clement  MD

## 2013-09-12 DIAGNOSIS — N184 Chronic kidney disease, stage 4 (severe): Secondary | ICD-10-CM

## 2013-09-12 DIAGNOSIS — J189 Pneumonia, unspecified organism: Secondary | ICD-10-CM

## 2013-09-12 DIAGNOSIS — E785 Hyperlipidemia, unspecified: Secondary | ICD-10-CM

## 2013-09-12 DIAGNOSIS — Z7901 Long term (current) use of anticoagulants: Secondary | ICD-10-CM

## 2013-09-12 DIAGNOSIS — R0609 Other forms of dyspnea: Secondary | ICD-10-CM

## 2013-09-12 LAB — CBC
MCH: 32.4 pg (ref 26.0–34.0)
MCV: 94.7 fL (ref 78.0–100.0)
Platelets: 221 10*3/uL (ref 150–400)
RDW: 13.6 % (ref 11.5–15.5)

## 2013-09-12 LAB — BASIC METABOLIC PANEL
BUN: 59 mg/dL — ABNORMAL HIGH (ref 6–23)
CO2: 31 mEq/L (ref 19–32)
Calcium: 8.2 mg/dL — ABNORMAL LOW (ref 8.4–10.5)
Creatinine, Ser: 3.08 mg/dL — ABNORMAL HIGH (ref 0.50–1.35)
GFR calc Af Amer: 22 mL/min — ABNORMAL LOW (ref >90)
GFR calc non Af Amer: 19 mL/min — ABNORMAL LOW (ref >90)

## 2013-09-12 MED ORDER — METOPROLOL SUCCINATE ER 50 MG PO TB24: 50.0000 mg | ORAL_TABLET | Freq: Every day | ORAL | Status: DC

## 2013-09-12 MED ORDER — METOPROLOL SUCCINATE ER 25 MG PO TB24
25.0000 mg | ORAL_TABLET | Freq: Every evening | ORAL | Status: DC
  Filled 2013-09-12: qty 1

## 2013-09-12 MED ORDER — METOPROLOL SUCCINATE ER 25 MG PO TB24: 25.0000 mg | ORAL_TABLET | Freq: Every evening | ORAL | Status: DC

## 2013-09-12 MED ORDER — DILTIAZEM HCL ER COATED BEADS 300 MG PO CP24: 300.0000 mg | ORAL_CAPSULE | Freq: Every day | ORAL | Status: DC

## 2013-09-12 MED ORDER — RIVAROXABAN 15 MG PO TABS: 15.0000 mg | ORAL_TABLET | Freq: Every day | ORAL | Status: DC

## 2013-09-12 MED ORDER — AMIODARONE HCL 200 MG PO TABS: 200.0000 mg | ORAL_TABLET | Freq: Two times a day (BID) | ORAL | Status: DC

## 2013-09-12 NOTE — Progress Notes (Signed)
1230 discharge instructions and prescriptions given to pt . Verbalized understanding

## 2013-09-12 NOTE — Progress Notes (Signed)
   CARE MANAGEMENT NOTE 09/12/2013  Patient:  SHELVY, PERAZZO   Account Number:  [id number]  Date Initiated:  09/09/2013  Documentation initiated by:  HUTCHINSON,CRYSTAL  Subjective/Objective Assessment:   Admitted with CHF     Action/Plan:   CM will continue to monitor for disposition needs.   Anticipated DC Date:  09/12/2013   Anticipated DC Plan:        DC Planning Services  CM consult  Medication Assistance      Choice offered to / List presented to:  C-1 Patient      DME agency  [facility]        Status of service:  Completed, signed off Medicare Important Message given?   (If response is "NO", the following Medicare IM given date fields will be blank) Date Medicare IM given:   Date Additional Medicare IM given:    Discharge Disposition:  HOME W HOME HEALTH SERVICES  Per UR Regulation:    If discussed at Long Length of Stay Meetings, dates discussed:    Comments:  09/12/13 12:15 CM to notify of discharge.  No other CM needs were communicated.  Freddy Jaksch, BSN, [place] [phone].  09/11/2013  Crystal Hutchinson RN, BSN, MSHL, CCM 09/11/2013 PCP,  Dr. Lynnea Ferrier [address] [place] [address] Phone: [phone] Mobility:  Remains mobile and able to self manage all ADLs. CM will continue to monitor for disposition needs.  09/09/2013 Crystal Hutchinson RN, BSN,  MSHL, CCM CM Consult:  Xarelto Xarelto 15mg  po daily with supper - first dose this evening Pharmacy:  [facility] [address], [place], [place] [address] [phone] CMA benefits request sent:  $30.00 co-pay 30 day supply CM provided patient with Xarelto Discount Card and instructed to activate card prior to pick up $5.00 co-pay. CM contacted [facility] and confirmed medication available. Other CM confirmed patient has home O2 2 l/m via [place] PR[facility] use only. O2 provider:  [facility]. Patient has home scales  but not weighing daily.  Patient continues to need CHF self mgmt  education. Crystal Hutchinson RN, BSN, [place], [place] 09/09/2013

## 2013-09-12 NOTE — Progress Notes (Addendum)
SUBJECTIVE: Pt says, "I feel great". Denies breathing difficulty. Thinks legs are less swollen. Denies lightheadedness/dizziness.     Intake/Output Summary (Last 24 hours) at 09/12/13 0838 Last data filed at 09/12/13 0535  Gross per 24 hour  Intake    860 ml  Output   1100 ml  Net   -240 ml    Current Facility-Administered Medications  Medication Dose Route Frequency Provider Last Rate Last Dose  . acetaminophen (TYLENOL) tablet 650 mg  650 mg Oral Q6H PRN Maryruth Bun Rama, MD       Or  . acetaminophen (TYLENOL) suppository 650 mg  650 mg Rectal Q6H PRN Christina P Rama, MD      . albuterol (PROVENTIL) (5 MG/ML) 0.5% nebulizer solution 2.5 mg  2.5 mg Nebulization Q2H PRN Elease Etienne, MD      . alum & mag hydroxide-simeth (MAALOX/MYLANTA) 200-200-20 MG/5ML suspension 30 mL  30 mL Oral Q6H PRN Maryruth Bun Rama, MD      . amiodarone (PACERONE) tablet 200 mg  200 mg Oral BID Cassell Clement, MD   200 mg at 09/11/13 2117  . antiseptic oral rinse (BIOTENE) solution 15 mL  15 mL Mouth Rinse BID Elease Etienne, MD   15 mL at 09/11/13 2037  . atorvastatin (LIPITOR) tablet 40 mg  40 mg Oral q1800 Maryruth Bun Rama, MD   40 mg at 09/10/13 1713  . diltiazem (CARDIZEM CD) 24 hr capsule 300 mg  300 mg Oral Daily Cassell Clement, MD   300 mg at 09/11/13 1049  . HYDROcodone-acetaminophen (NORCO/VICODIN) 5-325 MG per tablet 1-2 tablet  1-2 tablet Oral Q4H PRN Maryruth Bun Rama, MD      . metoprolol succinate (TOPROL-XL) 24 hr tablet 50 mg  50 mg Oral QPC breakfast Elease Etienne, MD   50 mg at 09/11/13 1046  . ondansetron (ZOFRAN) tablet 4 mg  4 mg Oral Q6H PRN Christina P Rama, MD       Or  . ondansetron (ZOFRAN) injection 4 mg  4 mg Intravenous Q6H PRN Christina P Rama, MD      . pantoprazole (PROTONIX) EC tablet 40 mg  40 mg Oral Daily Maryruth Bun Rama, MD   40 mg at 09/11/13 1109  . Rivaroxaban (XARELTO) tablet 15 mg  15 mg Oral Q supper Hessie Diener Maeser, RPH   15 mg at  09/11/13 1814  . sodium chloride 0.9 % injection 3 mL  3 mL Intravenous Q12H Maryruth Bun Rama, MD   3 mL at 09/11/13 2117    Filed Vitals:   09/11/13 1404 09/11/13 2012 09/11/13 2112 09/12/13 0534  BP: 110/83 91/70 108/71 122/81  Pulse: 78 75 110 100  Temp: 97.4 F (36.3 C) 97.9 F (36.6 C)  97.6 F (36.4 C)  TempSrc: Oral Oral  Oral  Resp: 18 18  20   Height:      Weight:    154 lb 12.8 oz (70.217 kg)  SpO2: 96% 95%  97%    PHYSICAL EXAM General: NAD Neck: No JVD, no thyromegaly or thyroid nodule.  Lungs: Diminished bilaterally with faint end-expiratory wheezes, no rales. CV: Nondisplaced PMI.  Irregular rhythm, normal S1/S2, no murmur.  2+ pitting pretibial edema.  No carotid bruit.  Normal pedal pulses.  Abdomen: Soft, nontender, no hepatosplenomegaly, no distention.  Neurologic: Alert and oriented x 3.  Psych: Normal affect. Extremities: No clubbing or cyanosis.   TELEMETRY: Reviewed telemetry pt in A Fib, HR  90's.  LABS: Basic Metabolic Panel:  Recent Labs  46/96/29 0510 09/12/13 0552  NA 141 133*  K 4.6 4.6  CL 97 91*  CO2 32 31  GLUCOSE 180* 141*  BUN 57* 59*  CREATININE 3.49* 3.08*  CALCIUM 8.3* 8.2*   Liver Function Tests: No results found for this basename: AST, ALT, ALKPHOS, BILITOT, PROT, ALBUMIN,  in the last 72 hours No results found for this basename: LIPASE, AMYLASE,  in the last 72 hours CBC:  Recent Labs  09/11/13 0510 09/12/13 0552  WBC 9.4 7.2  HGB 10.5* 10.4*  HCT 32.2* 30.4*  MCV 95.5 94.7  PLT 217 221   Cardiac Enzymes: No results found for this basename: CKTOTAL, CKMB, CKMBINDEX, TROPONINI,  in the last 72 hours BNP: No components found with this basename: POCBNP,  D-Dimer: No results found for this basename: DDIMER,  in the last 72 hours Hemoglobin A1C: No results found for this basename: HGBA1C,  in the last 72 hours Fasting Lipid Panel: No results found for this basename: CHOL, HDL, LDLCALC, TRIG, CHOLHDL, LDLDIRECT,  in  the last 72 hours Thyroid Function Tests: No results found for this basename: TSH, T4TOTAL, FREET3, T3FREE, THYROIDAB,  in the last 72 hours Anemia Panel: No results found for this basename: VITAMINB12, FOLATE, FERRITIN, TIBC, IRON, RETICCTPCT,  in the last 72 hours  RADIOLOGY: Dg Chest 2 View  09/10/2013   CLINICAL DATA:  Short of breath.  EXAM: CHEST  2 VIEW  COMPARISON:  09/08/2013  FINDINGS: Lungs are hyperexpanded. There are small bilateral pleural effusions, mildly decreased on the left since the prior study, with mild associated lung base reticular opacity, likely subsegmental atelectasis. There is no pulmonary edema. There is no convincing infiltrate.  Cardiac silhouette is normal in size. The mediastinum is normal in contour.  No pneumothorax.  IMPRESSION: Mild decrease in the small left pleural effusions since the prior study. No other change. No pulmonary edema is evident. Lung hyperexpansion suggests COPD.   Electronically Signed   By: Amie Portland M.D.   On: 09/10/2013 08:20   Dg Chest 2 View  09/08/2013   CLINICAL DATA:  Dyspnea and weakness and recent pneumonia  EXAM: CHEST  2 VIEW  COMPARISON:  Chest x-ray dated August 14, 2013.  FINDINGS: The lungs are well-expanded. The pleural effusion on the left is increased slightly in size. Pleural fluid has developed on the right as well. There are coarse lung markings in the retrocardiac region on the lateral film data lobe may reflect a bibasilar pneumonia. The cardiopericardial silhouette is normal in size. The pulmonary vascularity is not engorged. The mediastinum is normal in width. The observed portions of the bony thorax exhibit no acute abnormalities.  IMPRESSION: The findings are worrisome for bibasilar pneumonia with small but increasing bilateral pleural effusions. There is no evidence of CHF.   Electronically Signed   By: David  [place]   On: 09/08/2013 11:06   Dg Chest 2 View  08/14/2013   CLINICAL DATA:  Resolution of  pneumonia.  EXAM: CHEST  2 VIEW  COMPARISON:  08/11/2013  FINDINGS: Heart, mediastinal, and hilar contours are stable hand within normal limits. Aeration at the left lung base is improving, but not completely clear. There is a small left basilar patchy opacity. A small left pleural effusion appears larger on today's chest radiograph compared to recent prior. The right lung appears clear. Previously described right middle lobe nodule on prior chest CT is not visible on this chest radiograph.  Probable bilateral nipple shadows noted. No acute osseous abnormality.  IMPRESSION: 1. Faint left basilar opacity likely reflects residual airspace disease related to known pneumonia or aspiration. 2. Small left pleural effusion (likely parapneumonic), is increased in size from recent prior study.   Electronically Signed   By: Britta Mccreedy M.D.   On: 08/14/2013 18:24   Dg Ugi W/high Density W/kub  08/17/2013   CLINICAL DATA:  Dysphagia.  EXAM: UPPER GI SERIES W/HIGH DENSITY W/KUB  TECHNIQUE: After obtaining a scout radiograph a routine upper GI series was performed using thin and high density barium.  COMPARISON:  None.  FLUOROSCOPY TIME:  3 min and 13 seconds  FINDINGS: Initial barium swallows demonstrate normal pharyngeal motion with swallowing. No laryngeal penetration or aspiration. No upper esophageal webs, strictures or diverticuli.  Nonspecific esophageal dysmotility with occasional disruption of the primary peristaltic wave. There is a small to moderate-sized hiatal hernia with thickened gastric folds in the hernia. No obvious ulceration. No mass or obstruction. The 13 mm barium pill did pass into the stomach. GE reflux is demonstrated.  The stomach, duodenum bulb and C-loop are unremarkable. No ulceration or obstruction.  IMPRESSION: 1. Nonspecific esophageal dysmotility. 2. Small to moderate-sized hiatal hernia with thickening gastric folds in the hernia. GE reflux was demonstrated. 3. The stomach, duodenum bulb and  C-loop are unremarkable.   Electronically Signed   By: Loralie Champagne M.D.   On: 08/17/2013 15:50      ASSESSMENT AND PLAN: 1. Persistent atrial fibrillation: HR still in 90-100 bpm while lying down. Will add Toprol XL 25 mg q pm. Continue Xarelto, reduced dose 15 mg daily due to acute on chronic renal failure. Anticipate outpatient cardioversion in about 3 weeks. 2. Acute on chronic diastolic CHF: legs are edematous, but renal function worsened with aggressive diuresis. Diuretics currently on hold. Amlodipine was stopped. 3. COPD with exacerbation: continue current therapy. 4. Chronic kidney disease. Renal function worse since aggressive diuresis. Creatinine 3.08 today (GFR 19 ml/min, 16 ml/min on 12/26). Hold diuretics and aim to control HR. ARB's on hold.   Prentice Docker, M.D., F.A.C.C.

## 2013-09-12 NOTE — Discharge Summary (Signed)
Physician Discharge Summary  [name] WUJ:[id number] DOB: [birth year] DOA: 09/08/2013  PCP: Leo Grosser, MD  Admit date: 09/08/2013 Discharge date: 09/12/2013  Time spent: Greater than 30 minutes  Recommendations for Outpatient Follow-up:  1. Dr. Cassell Clement, cardiology in 5 days with repeat labs (CBC & BMP). 2. Dr. Lynnea Ferrier, PCP in one week  Discharge Diagnoses:  Principal Problem:   Acute on chronic diastolic CHF (congestive heart failure) Active Problems:   HTN (hypertension)   COPD with exacerbation   Atrial fibrillation   Long term (current) use of anticoagulants   Stage III chronic kidney disease   Acute-on-chronic respiratory failure   Discharge Condition: Improved & Stable  Diet recommendation: Heart healthy diet  Filed Weights   09/09/13 0459 09/10/13 0426 09/12/13 0534  Weight: 67.8 kg (149 lb 7.6 oz) 67.903 kg (149 lb 11.2 oz) 70.217 kg (154 lb 12.8 oz)    History of present illness:  Mark [name] is an 67 y.o. male with a PMH of COPD, recent pneumonia, stage III CKD, CHF with preserved EF, atrial fibrillation who was sent to the ER for further evaluation of dyspnea and elevated heart rate. The patient reports worsening activity tolerance for the past 2 weeks. Denies fever/chills. Says he had lost 10 pounds over the last week with medicines prescribed by his doctor to "remove fluid around my lungs". He was recently hospitalized 08/11/2013-08/17/2013 with a COPD exacerbation and pneumonia. He was discharged on home oxygen, a prednisone taper and Levaquin. While in the hospital, he developed atrial fibrillation with RVR. Heart rate was controlled with metoprolol, Cardizem and he was started on Coumadin. He has seen his PCP regularly since discharge. On 09/04/2013, he was noted to have worsening edema by his PCP. He was started on Lasix 40 mg daily at that time and he was noted to have an elevated heart rate so his Toprol was increased. He was seen  again in the office today, and his PCP felt he needs to be managed in the hospital for more aggressive diuresis and to achieve better heart rate control.  Hospital Course:   Principal Problem:  Acute on chronic diastolic CHF (congestive heart failure)  Patient was admitted and put on Lasix 60 mg IV twice daily. His heart failure appears to be from atrial fibrillation with rapid ventricular response. Last echocardiogram done on 08/13/2013 with EF of 55-60%. Cardiology was consulted. Patient improved on IV Lasix. His creatinine started to rise. Lasix was changed to by mouth at a reduced dose on 12/25. Despite this, creatinine peaked at 3.49 on 12/26. Lasix held. Cozaar discontinued for now. Creatinine has started to improve. Discussed with rounding cardiologist today who has cleared him for discharge home and advised to continue holding Lasix and ARB's until outpatient followup with cardiology with repeat labs.  Active Problems:  HTN (hypertension)  Continue Cardizem, and metoprolol (dose reduced due to relative hypotension).  Cardizem dose being adjusted by cardiology for heart rate control. Cozaar which was started in hospital was discontinued secondary to worsening renal failure. Due to leg edema and soft blood pressures, DC'ed amlodipine.  Low dose metoprolol was added today to p.m. medications to help control ventricular rate.  COPD with exacerbation  Duonebs and Solu-Medrol 80 mg IV every 12 hours. His initial presentation was probably due to decompensated CHF. DC'ed Solu-Medrol. Stable.   Atrial fibrillation  Continue rate controlling medications (diltiazem, metoprolol and low-dose amiodarone added) as above. Cardiology input appreciated. Cardiology has changed anticoagulation from warfarin  to Xarelto and will consider OP cardioversion in 3 weeks. Obviously if patient's renal functions decline, Xarelto cannot be used in may have to consider alternate agent. Patient's heart rates are in the  90s-100s. Cardiology has added low dose metoprolol in the evening to help control heart rate.  Acute on Stage III chronic kidney disease  Baseline creatinine 2.2-2.4. Her renal functions are declining despite changing Lasix to reduce dose by mouth and holding ARB yesterday. Discontinued Lasix and ARB. Patient encouraged to increase by mouth fluid intake. Likely secondary to over diuresis and hemodynamics. No hydronephrosis by renal ultrasound on 10/06/12 . Creatinine has improved. Outpatient followup with repeat BMP in 5 days.  Acute-on-chronic respiratory failure  Multi-factorial with COPD and CHF contributory. See above. Supplemental oxygen provided. Improved.   Abdominal aortic aneurysm with intraluminal clot  - Seen on renal ultrasound 10/06/12. Says 7.1 x 6.8 cm. Continue anticoagulation. Outpatient followup  Anemia - Unclear etiology. Stable. No overt bleeding. Outpatient followup.  Consultations:  Cardiology  Procedures:  None    Discharge Exam:  Complaints:  Patient denies dyspnea, chest pain or palpitations. Leg swellings decreasing.  Filed Vitals:   09/11/13 2012 09/11/13 2112 09/12/13 0534 09/12/13 1104  BP: 91/70 108/71 122/81 134/93  Pulse: 75 110 100 129  Temp: 97.9 F (36.6 C)  97.6 F (36.4 C)   TempSrc: Oral  Oral   Resp: 18  20 18   Height:      Weight:   70.217 kg (154 lb 12.8 oz)   SpO2: 95%  97% 100%    General exam: Middle-aged pleasant male lying comfortably supine in bed  Respiratory system: clear to auscultation. No increased work of breathing.  Cardiovascular system: S1 & S2 heard, irregularly irregular. No JVD, murmurs, gallops, clicks. 1+ pitting bilateral leg edema to knees. Telemetry: A. fib in the 90's-100s.  Gastrointestinal system: Abdomen is nondistended, soft and nontender. Normal bowel sounds heard. Midline laparotomy scar.  Central nervous system: Alert and oriented. No focal neurological deficits.  Extremities: Symmetric 5 x 5  power.   Discharge Instructions      Discharge Orders   Future Appointments Provider Department Dept Phone   10/16/2013 8:00 AM Donita Brooks, MD [facility] [phone]   12/29/2013 9:00 AM [facility] [place] [facility] [address] [phone]   12/29/2013 9:30 AM Pryor Ochoa, MD [facility] [phone]   Future Orders Complete By Expires   (HEART FAILURE PATIENTS) Call MD:  Anytime you have any of the following symptoms: 1) 3 pound weight gain in 24 hours or 5 pounds in 1 week 2) shortness of breath, with or without a dry hacking cough 3) swelling in the hands, feet or stomach 4) if you have to sleep on extra pillows at night in order to breathe.  As directed    Call MD for:  difficulty breathing, headache or visual disturbances  As directed    Call MD for:  extreme fatigue  As directed    Call MD for:  persistant dizziness or light-headedness  As directed    Diet - low sodium heart healthy  As directed    Increase activity slowly  As directed        Medication List    STOP taking these medications       furosemide 40 MG tablet  Commonly known as:  LASIX     warfarin 3 MG tablet  Commonly known as:  COUMADIN  TAKE these medications       albuterol 108 (90 BASE) MCG/ACT inhaler  Commonly known as:  PROVENTIL HFA;VENTOLIN HFA  Inhale 2 puffs into the lungs every 6 (six) hours as needed for wheezing or shortness of breath.     amiodarone 200 MG tablet  Commonly known as:  PACERONE  Take 1 tablet (200 mg total) by mouth 2 (two) times daily.     atorvastatin 40 MG tablet  Commonly known as:  LIPITOR  Take 40 mg by mouth daily.     diltiazem 300 MG 24 hr capsule  Commonly known as:  CARDIZEM CD  Take 1 capsule (300 mg total) by mouth daily.     metoprolol succinate 50 MG 24 hr tablet  Commonly known as:  TOPROL-XL  Take 1 tablet (50 mg total) by mouth daily after breakfast. Take with or immediately  following a meal.     metoprolol succinate 25 MG 24 hr tablet  Commonly known as:  TOPROL-XL  Take 1 tablet (25 mg total) by mouth every evening.     omeprazole 40 MG capsule  Commonly known as:  PRILOSEC  Take 1 capsule (40 mg total) by mouth 2 (two) times daily.     Rivaroxaban 15 MG Tabs tablet  Commonly known as:  XARELTO  Take 1 tablet (15 mg total) by mouth daily with supper.       Follow-up Information   Follow up with Cassell Clement, MD. Schedule an appointment as soon as possible for a visit in 5 days. (To be seen with repeat labs (BMP & CBC))    Specialty:  Cardiology   Contact information:   [address] [place] [address] [phone]       Follow up with [facility] TOM, MD. Schedule an appointment as soon as possible for a visit in 1 week.   Specialty:  Family Medicine   Contact information:   [address] [place] [address] [phone]        The results of significant diagnostics from this hospitalization (including imaging, microbiology, ancillary and laboratory) are listed below for reference.    Significant Diagnostic Studies: Dg Chest 2 View  09/10/2013   CLINICAL DATA:  Short of breath.  EXAM: CHEST  2 VIEW  COMPARISON:  09/08/2013  FINDINGS: Lungs are hyperexpanded. There are small bilateral pleural effusions, mildly decreased on the left since the prior study, with mild associated lung base reticular opacity, likely subsegmental atelectasis. There is no pulmonary edema. There is no convincing infiltrate.  Cardiac silhouette is normal in size. The mediastinum is normal in contour.  No pneumothorax.  IMPRESSION: Mild decrease in the small left pleural effusions since the prior study. No other change. No pulmonary edema is evident. Lung hyperexpansion suggests COPD.   Electronically Signed   By: Amie Portland M.D.   On: 09/10/2013 08:20   Dg Chest 2 View  09/08/2013   CLINICAL DATA:  Dyspnea and weakness and recent  pneumonia  EXAM: CHEST  2 VIEW  COMPARISON:  Chest x-ray dated August 14, 2013.  FINDINGS: The lungs are well-expanded. The pleural effusion on the left is increased slightly in size. Pleural fluid has developed on the right as well. There are coarse lung markings in the retrocardiac region on the lateral film data lobe may reflect a bibasilar pneumonia. The cardiopericardial silhouette is normal in size. The pulmonary vascularity is not engorged. The mediastinum is normal in width. The observed portions  of the bony thorax exhibit no acute abnormalities.  IMPRESSION: The findings are worrisome for bibasilar pneumonia with small but increasing bilateral pleural effusions. There is no evidence of CHF.   Electronically Signed   By: David  [place]   On: 09/08/2013 11:06   Dg Chest 2 View  08/14/2013   CLINICAL DATA:  Resolution of pneumonia.  EXAM: CHEST  2 VIEW  COMPARISON:  08/11/2013  FINDINGS: Heart, mediastinal, and hilar contours are stable hand within normal limits. Aeration at the left lung base is improving, but not completely clear. There is a small left basilar patchy opacity. A small left pleural effusion appears larger on today's chest radiograph compared to recent prior. The right lung appears clear. Previously described right middle lobe nodule on prior chest CT is not visible on this chest radiograph. Probable bilateral nipple shadows noted. No acute osseous abnormality.  IMPRESSION: 1. Faint left basilar opacity likely reflects residual airspace disease related to known pneumonia or aspiration. 2. Small left pleural effusion (likely parapneumonic), is increased in size from recent prior study.   Electronically Signed   By: Britta Mccreedy M.D.   On: 08/14/2013 18:24   Dg Ugi W/high Density W/kub  08/17/2013   CLINICAL DATA:  Dysphagia.  EXAM: UPPER GI SERIES W/HIGH DENSITY W/KUB  TECHNIQUE: After obtaining a scout radiograph a routine upper GI series was performed using thin and high density  barium.  COMPARISON:  None.  FLUOROSCOPY TIME:  3 min and 13 seconds  FINDINGS: Initial barium swallows demonstrate normal pharyngeal motion with swallowing. No laryngeal penetration or aspiration. No upper esophageal webs, strictures or diverticuli.  Nonspecific esophageal dysmotility with occasional disruption of the primary peristaltic wave. There is a small to moderate-sized hiatal hernia with thickened gastric folds in the hernia. No obvious ulceration. No mass or obstruction. The 13 mm barium pill did pass into the stomach. GE reflux is demonstrated.  The stomach, duodenum bulb and C-loop are unremarkable. No ulceration or obstruction.  IMPRESSION: 1. Nonspecific esophageal dysmotility. 2. Small to moderate-sized hiatal hernia with thickening gastric folds in the hernia. GE reflux was demonstrated. 3. The stomach, duodenum bulb and C-loop are unremarkable.   Electronically Signed   By: Loralie Champagne M.D.   On: 08/17/2013 15:50    Microbiology: No results found for this or any previous visit (from the past 240 hour(s)).   Labs: Basic Metabolic Panel:  Recent Labs Lab 09/08/13 1032 09/09/13 0458 09/10/13 0443 09/11/13 0510 09/12/13 0552  NA 140 139 139 141 133*  K 4.2 4.2 5.1 4.6 4.6  CL 97 95* 96 97 91*  CO2 34* 33* 32 32 31  GLUCOSE 137* 185* 188* 180* 141*  BUN 19 25* 41* 57* 59*  CREATININE 1.94* 2.31* 3.01* 3.49* 3.08*  CALCIUM 9.2 9.0 8.9 8.3* 8.2*   Liver Function Tests:  Recent Labs Lab 09/08/13 1032  AST 16  ALT 21  ALKPHOS 87  BILITOT 0.3  PROT 7.0  ALBUMIN 3.1*   No results found for this basename: LIPASE, AMYLASE,  in the last 168 hours No results found for this basename: AMMONIA,  in the last 168 hours CBC:  Recent Labs Lab 09/08/13 1032 09/10/13 0443 09/11/13 0510 09/12/13 0552  WBC 4.7 8.6 9.4 7.2  HGB 13.0 11.2* 10.5* 10.4*  HCT 38.6* 33.5* 32.2* 30.4*  MCV 95.5 94.6 95.5 94.7  PLT 135* 188 217 221   Cardiac Enzymes:  Recent Labs Lab  09/08/13 1032  TROPONINI <0.30   BNP:  BNP (last 3 results)  Recent Labs  09/08/13 1032  PROBNP 5402.0*   CBG: No results found for this basename: GLUCAP,  in the last 168 hours   Signed:  Marcellus Scott, MD, FACP, FHM. [facility] Pager [phone]  If 7PM-7AM, please contact night-coverage [email or web address] Password [email or web address] 09/12/2013, 11:08 AM

## 2013-09-14 ENCOUNTER — Telehealth: Payer: Self-pay | Admitting: Cardiology

## 2013-09-14 DIAGNOSIS — I509 Heart failure, unspecified: Secondary | ICD-10-CM

## 2013-09-14 DIAGNOSIS — I4891 Unspecified atrial fibrillation: Secondary | ICD-10-CM

## 2013-09-14 NOTE — Telephone Encounter (Signed)
Follow up    Pt's daughter called pt needs a post Hosp appt with in 5 day's /labs please I was unable to find one .   Give them a call with an appointment. Please.

## 2013-09-14 NOTE — Telephone Encounter (Signed)
12/ 31 LAB DATE GIVEN 09/22/12 APP W/ WEAVER GIVEN, PT WAS ADVISED TO AVOID HIGH SODIUM FOODS, LIST WAS REVIEWED. PT AGREED TO PLAN.

## 2013-09-16 ENCOUNTER — Other Ambulatory Visit (INDEPENDENT_AMBULATORY_CARE_PROVIDER_SITE_OTHER): Payer: Medicare Other

## 2013-09-16 DIAGNOSIS — I509 Heart failure, unspecified: Secondary | ICD-10-CM

## 2013-09-16 DIAGNOSIS — I4891 Unspecified atrial fibrillation: Secondary | ICD-10-CM

## 2013-09-16 LAB — BASIC METABOLIC PANEL
BUN: 31 mg/dL — ABNORMAL HIGH (ref 6–23)
CO2: 33 mEq/L — ABNORMAL HIGH (ref 19–32)
Calcium: 8.5 mg/dL (ref 8.4–10.5)
GFR: 28.8 mL/min — ABNORMAL LOW (ref >60.00)
Sodium: 134 mEq/L — ABNORMAL LOW (ref 135–145)

## 2013-09-16 LAB — CBC
MCHC: 32.7 g/dL (ref 30.0–36.0)
MCV: 96.6 fl (ref 78.0–100.0)
Platelets: 209 10*3/uL (ref 150.0–400.0)
RBC: 3.7 Mil/uL — ABNORMAL LOW (ref 4.22–5.81)
RDW: 14.4 % (ref 11.5–14.6)
WBC: 9.1 10*3/uL (ref 4.5–10.5)

## 2013-09-17 ENCOUNTER — Telehealth: Payer: Self-pay | Admitting: Nurse Practitioner

## 2013-09-17 NOTE — Telephone Encounter (Signed)
Pts family member called this morning to report that he's been having intermittent nose bleeding since about 3 AM.  He takes Xarelto for a h/o afib.  He has been able to slow or stop the bleed on 3 different occasions but they keep recurring every few hrs.  I recommended that if he has no further bleeding, he should hold his xarelto tonight with a plan to resume tomorrow if bleeding does not recur.  If however, bleeding recurs again today, he should present to the ED for evaluation.

## 2013-09-17 NOTE — H&P (Signed)
[facility]  History and Physical   Late entry: Originally mis-classified as a progress note  Mark [name] [id number] DOB: 1946/04/25 DOA: 09/08/2013  Referring physician: Dr. Cathren LaineKevin Steinl  PCP: Leo GrosserPICKARD,WARREN TOM, MD   Chief Complaint: Dyspnea   History of Present Illness:  Mark [name] is an 68 y.o. male with a PMH of COPD, recent pneumonia, stage III CKD, CHF with preserved EF, atrial fibrillation who was sent to the ER for further evaluation of dyspnea and elevated heart rate. The patient reports worsening activity tolerance for the past 2 weeks. Denies fever/chills. Says he had lost 10 pounds over the last week with medicines prescribed by his doctor to "remove fluid around my lungs". He was recently hospitalized 08/11/2013-08/17/2013 with a COPD exacerbation and pneumonia. He was discharged on home oxygen, a prednisone taper and Levaquin. While in the hospital, he developed atrial fibrillation with RVR. Heart rate was controlled with metoprolol, Cardizem and he was started on Coumadin. He has seen his PCP regularly since discharge. On 09/04/2013, he was noted to have worsening edema by his PCP. He was started on Lasix 40 mg daily at that time and he was noted to have an elevated heart rate so his Toprol was increased. He was seen again in the office today, and his PCP felt he needs to be managed in the hospital for more aggressive diuresis and to achieve better heart rate control.   Review of Systems:  Constitutional: No fever, no chills; Appetite normal; No weight loss, no weight gain, + fatigue. HEENT: No blurry vision, no diplopia, no pharyngitis, no dysphagia CV: No chest pain, no palpitations, no PND. Resp: + SOB, + cough productive of clear sputum, no pleuritic pain. GI: No nausea, no vomiting, no diarrhea, no melena, no hematochezia, no constipation. GU: No dysuria, no hematuria, no frequency, no urgency. MSK: no myalgias, no arthralgias. Neuro: No headache, no focal  neurological deficits, no history of seizures. Psych: No depression, no anxiety. Endo: No heat intolerance, no cold intolerance, no polyuria, no polydipsia Skin: No rashes, no skin lesions. Heme: No easy bruising. Travel history: None recent.   Past Medical History  Past Medical History   Diagnosis  Date   .  GERD (gastroesophageal reflux disease)    .  Hyperlipidemia    .  Atrial fibrillation      stress test 10/09/12   .  H/O hiatal hernia    .  Arthritis    .  Hypertension      sees Dr. Lynnea FerrierWarren Pickard   .  Chronic kidney disease     Past Surgical History  Past Surgical History   Procedure  Laterality  Date   .  Knee surgery       right   .  Abdominal aortic aneurysm repair   10/15/2012     Procedure: ANEURYSM ABDOMINAL AORTIC REPAIR; Surgeon: Pryor OchoaJames D Lawson, MD; [facility]; Service: Vascular; Laterality: N/A; Resection and Grafting of Abdominal Aortic Aneurysm - Aortobi-Iliac   .  Abdominal aortic aneurysm repair      Social History:  History    Social History   .  Marital Status:  Married     Spouse Name:  [name]     Number of Children:  4   .  Years of Education:  N/A    Occupational History   .  Works PT as a Education administratorpainter     Social History Main Topics   .  Smoking status:  Former  Smoker -- 0.30 packs/day for 50 years     Types:  Cigarettes   .  Smokeless tobacco:  Never Used      Comment: pt states that he does not smoke over 5 cigs per day   .  Alcohol Use:  No   .  Drug Use:  No   .  Sexual Activity:  Not on file    Other Topics  Concern   .  Not on file    Social History Narrative    Married. Lives with wife. Ambulates without assistance.    Family History:  Family History   Problem  Relation  Age of Onset   .  Diabetes  Mother    .  Heart disease  Father    .  Heart attack  Father     Allergies:  Niaspan  Meds:  Prior to Admission medications   Medication  Sig  Start Date  End Date  Taking?  Authorizing Provider   omeprazole (PRILOSEC) 40 MG  capsule  Take 1 capsule (40 mg total) by mouth 2 (two) times daily.  08/17/13   Yes  Kathlen Mody, MD   albuterol (PROVENTIL HFA;VENTOLIN HFA) 108 (90 BASE) MCG/ACT inhaler  Inhale 2 puffs into the lungs every 6 (six) hours as needed for wheezing or shortness of breath.  08/18/13    Donita Brooks, MD   amLODipine (NORVASC) 10 MG tablet  TAKE 1 TABLET BY MOUTH AT BEDTIME  09/04/13    Salley Scarlet, MD   atorvastatin (LIPITOR) 40 MG tablet  TAKE 1 TABLET AT BEDTIME.STOP TAKING SIMVASTATIN  09/04/13    Salley Scarlet, MD   diltiazem (CARDIZEM CD) 360 MG 24 hr capsule  Take 1 capsule (360 mg total) by mouth daily.  08/17/13    Kathlen Mody, MD   furosemide (LASIX) 40 MG tablet  Take 1 tablet (40 mg total) by mouth daily.  09/04/13    Donita Brooks, MD   ipratropium (ATROVENT HFA) 17 MCG/ACT inhaler  Inhale 2 puffs into the lungs every 6 (six) hours as needed for wheezing.  08/18/13    Donita Brooks, MD   losartan (COZAAR) 100 MG tablet  TAKE 1 TABLET EVERY DAY (DOSAGE INCREASED)  09/04/13    Salley Scarlet, MD   metoprolol succinate (TOPROL-XL) 100 MG 24 hr tablet  Take 1 tablet (100 mg total) by mouth daily. Take with or immediately following a meal.  08/17/13    Kathlen Mody, MD   warfarin (COUMADIN) 3 MG tablet  Take as directed by coumadin clinic  08/26/13    Cassell Clement, MD   Physical Exam:  Filed Vitals:    09/08/13 1145  09/08/13 1200  09/08/13 1215  09/08/13 1230   BP:  128/89  143/86  137/90  124/94   Pulse:  70      Temp:       TempSrc:       Resp:  16  15  19  16    Height:       Weight:       SpO2:  96%       Physical Exam:  Blood pressure 124/94, pulse 70, temperature 97.6 F (36.4 C), temperature source Oral, resp. rate 16, height 5\' 6"  (1.676 m), weight 70.852 kg (156 lb 3.2 oz), SpO2 96.00%.  Gen: No acute distress.  Head: Normocephalic, atraumatic.  Eyes: PERRL, EOMI, sclerae nonicteric.  Mouth: Oropharynx clear.  Neck: Supple, no thyromegaly,  no  lymphadenopathy, no jugular venous distention.  Chest: Lungs with a few bibasilar rales.  CV: Heart sounds are regular. No murmurs, rubs, or gallops.  Abdomen: Soft, nontender, nondistended with normal active bowel sounds.  Extremities: Extremities are with 1+ edema.  Skin: Warm and dry.  Neuro: Alert and oriented times 3; cranial nerves II through XII grossly intact.  Psych: Mood and affect normal.  Labs on Admission:  Basic Metabolic Panel:   Recent Labs  Lab  09/08/13 1032   NA  140   K  4.2   CL  97   CO2  34*   GLUCOSE  137*   BUN  19   CREATININE  1.94*   CALCIUM  9.2    Liver Function Tests:   Recent Labs  Lab  09/08/13 1032   [name]  16   ALT  21   ALKPHOS  87   BILITOT  0.3   PROT  7.0   ALBUMIN  3.1*    CBC:   Recent Labs  Lab  09/08/13 1032   WBC  4.7   HGB  13.0   HCT  38.6*   MCV  95.5   PLT  135*    Cardiac Enzymes:   Recent Labs  Lab  09/08/13 1032   TROPONINI  <0.30    BNP (last 3 results)   Recent Labs   09/08/13 1032   PROBNP  5402.0*    CBG:  No results found for this basename: GLUCAP, in the last 168 hours  Radiological Exams on Admission:  Dg Chest 2 View  09/08/2013 CLINICAL DATA: Dyspnea and weakness and recent pneumonia EXAM: CHEST 2 VIEW COMPARISON: Chest x-ray dated August 14, 2013. FINDINGS: The lungs are well-expanded. The pleural effusion on the left is increased slightly in size. Pleural fluid has developed on the right as well. There are coarse lung markings in the retrocardiac region on the lateral film data lobe may reflect a bibasilar pneumonia. The cardiopericardial silhouette is normal in size. The pulmonary vascularity is not engorged. The mediastinum is normal in width. The observed portions of the bony thorax exhibit no acute abnormalities. IMPRESSION: The findings are worrisome for bibasilar pneumonia with small but increasing bilateral pleural effusions. There is no evidence of CHF. Electronically Signed By: David  [place] On: 09/08/2013 11:06   EKG: Independently reviewed. Atrial fibrillation at 97 beats per minute.  Assessment/Plan  Principal Problem:  Acute on chronic diastolic CHF (congestive heart failure)  Patient was admitted and put on 60 mg IV twice daily. His heart failure appears to be from atrial fibrillation with rapid ventricular response. Last echocardiogram done on 08/13/2013 with EF of 55-60%. Cardiology consultation requested.  Active Problems:  HTN (hypertension)  Continue Cozaar, Cardizem and metoprolol. Continue higher dose Lasix.  COPD with exacerbation  Duonebs and Solu-Medrol 80 mg IV every 12 hours.  Atrial fibrillation  Will ask cardiology to evaluate. Continue rate controlling medications for now. May need DC cardioversion.  Long term (current) use of anticoagulants  Continue Coumadin per pharmacy.  Stage III chronic kidney disease  Renal function stable. Baseline creatinine 2.2-2.4. Current creatinine 1.9.  Acute-on-chronic respiratory failure  Multi-factorial with COPD and CHF contributory. See above. Supplemental oxygen provided.   Code Status: Full.  Family Communication: No family at bedside.  Disposition Plan: Home when stable.   Time spent: 1 hour.   Boyce Keltner  [facility]  Pager [phone]  If 7PM-7AM, please contact night-coverage  [email or web address]  Password [email or web address]  09/08/2013, 1:21 PM

## 2013-09-18 ENCOUNTER — Encounter: Payer: Self-pay | Admitting: Family Medicine

## 2013-09-18 ENCOUNTER — Telehealth: Payer: Self-pay | Admitting: *Deleted

## 2013-09-18 ENCOUNTER — Ambulatory Visit (INDEPENDENT_AMBULATORY_CARE_PROVIDER_SITE_OTHER): Payer: Medicare Other | Admitting: Family Medicine

## 2013-09-18 VITALS — BP 130/72 | HR 80 | Temp 97.5°F | Resp 22 | Ht 66.5 in | Wt 164.0 lb

## 2013-09-18 DIAGNOSIS — J441 Chronic obstructive pulmonary disease with (acute) exacerbation: Secondary | ICD-10-CM

## 2013-09-18 DIAGNOSIS — J810 Acute pulmonary edema: Secondary | ICD-10-CM

## 2013-09-18 MED ORDER — PREDNISONE 20 MG PO TABS: ORAL_TABLET | ORAL | Status: DC

## 2013-09-18 MED ORDER — ATORVASTATIN CALCIUM 40 MG PO TABS: 40.0000 mg | ORAL_TABLET | Freq: Every day | ORAL | Status: DC

## 2013-09-18 MED ORDER — FUROSEMIDE 40 MG PO TABS: 40.0000 mg | ORAL_TABLET | Freq: Every day | ORAL | Status: DC

## 2013-09-18 MED ORDER — METHYLPREDNISOLONE ACETATE 40 MG/ML IJ SUSP
80.0000 mg | Freq: Once | INTRAMUSCULAR | Status: AC
  Administered 2013-09-18: 80 mg via INTRAMUSCULAR

## 2013-09-18 NOTE — Telephone Encounter (Signed)
lmptcb for lab results 

## 2013-09-18 NOTE — Telephone Encounter (Signed)
Patient seen today. Tereso NewcomerScott Anatole Apollo, PA-C   09/18/2013 2:19 PM

## 2013-09-18 NOTE — Progress Notes (Signed)
Subjective:    Patient ID: Mark [name], male    DOB: [birth year], 68 y.o.   MRN: [id number]  HPI 08/21/13 Patient was recently admitted to the hospital for COPD exacerbation as well as community-acquired pneumonia. He was treated with IV antibiotics, IV steroids, and nebulizer treatments. However in the hospital he developed a fibrillation with rapid ventricular response. He was eventually managed with diltiazem, metoprolol, and anticoagulated on Coumadin. He is currently on Coumadin 2 mg a day. His most recent INR was checked 2 days ago was found to be therapeutic at 2.2. He has completed his antibiotics. He is slowly tapering off prednisone. He continues to wear oxygen. He is 96% on room air today on oxygen 96% on room air today off oxygen. He continues to have distant breath sounds and right basilar crackles. Overall he feels well. His heart rate is ranging 80-95 today.  He denies any syncope, presyncope, chest pain.  He continues to have some dyspnea on exertion. He is only had to use the albuterol inhaler 2 times since his discharge from hospital.  At that time, my plan was: 1. Hospital discharge follow-up He is no longer smoking. I did discuss Chantix with him but he does not really need it at the present time. I recommended continuing oxygen until his follow up visit in 2 weeks. At that time, I hope we can discontinue his oxygen. Also like to recheck his lungs in 2 weeks to make sure the pneumonia has clinically improved.  2. COPD exacerbation Continue to wean off the prednisone. Use albuterol 2 puffs inhaled every 6 hours as needed for wheezing. If he does require the albuterol often, I would add Symbicort, another long acting bronchodilator.  I will give the patient Prevnar 13 once he is off prednisone.  3. Atrial fibrillation Currently rate controlled and appropriately anticoagulated. In the future I would be glad to check his Coumad[place] level here [place]. We can also discuss possibly switching him to eliquis in the future once he is stable. I would not be surprised if he spontaneously cardioverted back to normal sinus rhythm once his primary issues resolve.  Recheck in 2 weeks. 09/04/13 Patient is here today for followup. Since I saw the patient 2 weeks ago he has gained 10 pounds in fluid. He has +2 pitting edema to the level of his knees. His tachycardic heart rate ranging between 95 and 115. He is currently in atrial fibrillation. He does report some dyspnea on exertion and shortness of breath. He denies any fever or coughing. He is no longer wearing oxygen his pulse oximetry is 97% on room air.  At that time, my plan was: 1. Edema Begin Lasix 40 mg by mouth daily. Recheck the patient next week. I will repeat a BMP at that time to make sure his potassium is not low and that the lasix is not exacerbating his chronic kidney disease. - furosemide (LASIX) 40 MG tablet; Take 1 tablet (40 mg total) by mouth daily.  Dispense: 30 tablet; Refill: 3  2. Atrial fibrillation Patient's heart rate is currently not controlled. I recommend increasing Toprol-XL to 200 mg by mouth daily. I have the patient return next week to recheck his heart rate, and blood pressure, as well as his kidney function and his edema. Ultimately I think the patient would benefit from cardioversion. The patient is scheduled for an INR at his cardiologist's office on Friday. If the patient has 4  consecutive therapeutic INRs, I believe they will schedule him for cardioversion  09/08/13 The patient has diuresed 10 pounds since his last office visit unfortunately his pulse oximetry on room air is 79%. He reports shortness of breath at rest and significant dyspnea on exertion. There is bilateral crackles on his lung exam consistent with pulmonary edema. He has +2 edema in both legs to the mid shin. He still appears fluid overloaded.  Furthermore on both Cardizem and increase dose of  Toprol-XL his heart rate is still poorly controlled. He is ranging between 100-120. He denies any chest pain.  At that time, my plan was: COPD exacerbation - Plan: methylPREDNISolone acetate (DEPO-MEDROL) injection 80 mg  Acute pulmonary edema  Unfortunately even after diuresing 10 pounds the patient remains fluid overloaded. Given his history of chronic kidney disease, significant diuresis needs to be monitored with labs to avoid hypokalemia and prerenal azotemia.  Furthermore he needs medication to control his heart rate.  Unfortunately even on the Cardizem and increase dose of the Toprol I have been unsuccessful in controlling his heart rate. Given his significant hypoxia, his underlying chronic kidney disease, I do not feel the patient is stable to be managed at home over the Christmas holidays. I recommended he go to the emergency room for oxygen, IV diuresis, laboratory studies, and increase medication for heart rate control such as amiodarone  09/18/13 Patient is here today for followup. He was kept in the hospital for possibly 6 days where he received IV diuresis with Lasix.  However the patient experienced exacerbation of his chronic kidney disease and therefore Lasix was held at the patient's discharge from hospital. Amiodarone was added to control his heart rate. Since the patient has been discharged from the hospital he is gained approximately 6 pounds of fluid. He has +2 pitting edema in both legs. He has bibasilar crackles in the midlung field. He is also audibly wheezing on exam. He is using albuterol 2 puffs inhaled every 6 hours moderate relief. His pulse oximetry is 80% on 2 L. I increased his oxygen to 4 L and his pulse oximetry was 95%  Past Medical History  Diagnosis Date  . GERD (gastroesophageal reflux disease)   . Hyperlipidemia   . Atrial fibrillation     stress test 10/09/12  . H/O hiatal hernia   . Hypertension     sees Dr. Lynnea Ferrier  . AAA (abdominal aortic aneurysm)    . COPD (chronic obstructive pulmonary disease)   . CHF (congestive heart failure)   . Pneumonia   . Exertional shortness of breath     "just recently" (09/08/2013)  . Arthritis     "little in my hands" (09/08/2013)  . Chronic kidney disease (CKD), stage III (moderate)     Hattie Perch 09/08/2013   Past Surgical History  Procedure Laterality Date  . Knee arthroscopy Right   . Abdominal aortic aneurysm repair  10/15/2012    Procedure: ANEURYSM ABDOMINAL AORTIC REPAIR;  Surgeon: Pryor Ochoa, MD;  Location: [facility] OR;  Service: Vascular;  Laterality: N/A;  Resection and Grafting of Abdominal Aortic Aneurysm - Aortobi-Iliac    Current Outpatient Prescriptions on File Prior to Visit  Medication Sig Dispense Refill  . albuterol (PROVENTIL HFA;VENTOLIN HFA) 108 (90 BASE) MCG/ACT inhaler Inhale 2 puffs into the lungs every 6 (six) hours as needed for wheezing or shortness of breath.  1 Inhaler  2  . amiodarone (PACERONE) 200 MG tablet Take 1 tablet (200 mg total) by mouth 2 (  two) times daily.  30 tablet  0  . diltiazem (CARDIZEM CD) 300 MG 24 hr capsule Take 1 capsule (300 mg total) by mouth daily.  30 capsule  0  . metoprolol succinate (TOPROL-XL) 25 MG 24 hr tablet Take 1 tablet (25 mg total) by mouth every evening.  30 tablet  0  . metoprolol succinate (TOPROL-XL) 50 MG 24 hr tablet Take 1 tablet (50 mg total) by mouth daily after breakfast. Take with or immediately following a meal.  30 tablet  0  . omeprazole (PRILOSEC) 40 MG capsule Take 1 capsule (40 mg total) by mouth 2 (two) times daily.  60 capsule  1  . Rivaroxaban (XARELTO) 15 MG TABS tablet Take 1 tablet (15 mg total) by mouth daily with supper.  30 tablet  0   No current facility-administered medications on file prior to visit.   Allergies  Allergen Reactions  . Niaspan [Niacin Er] Anaphylaxis   History   Social History  . Marital Status: Married    Spouse Name: Nicole Cella    Number of Children: 4  . Years of Education: N/A    Occupational History  . Works PT as a Education administrator    Social History Main Topics  . Smoking status: Former Smoker -- 1.00 packs/day for 56 years    Types: Cigarettes  . Smokeless tobacco: Former Neurosurgeon    Types: Chew     Comment: 09/08/2013 "stopped smoking cigarettes ~ 2 months ago; aien't chew in probably 10 yrs"  . Alcohol Use: No  . Drug Use: No  . Sexual Activity: Not Currently   Other Topics Concern  . Not on file   Social History Narrative   Married.  Lives with wife.  Ambulates without assistance.      Review of Systems  All other systems reviewed and are negative.       Objective:   Physical Exam  Vitals reviewed. Cardiovascular: Normal rate and normal heart sounds.  An irregularly irregular rhythm present.  No murmur heard. Pulmonary/Chest: Effort normal. He has decreased breath sounds. He has wheezes in the right upper field, the right lower field, the left upper field and the left lower field. He has no rhonchi. He has rales in the right lower field and the left lower field.  Abdominal: Soft. Bowel sounds are normal. He exhibits no distension. There is no tenderness. There is no rebound.  Musculoskeletal: He exhibits edema.   +2 edema in both legs.       Assessment & Plan:   1. COPD exacerbation - methylPREDNISolone acetate (DEPO-MEDROL) injection 80 mg; Inject 2 mLs (80 mg total) into the muscle once.  2. Acute pulmonary edema  Patient is in hypoxic respiratory failure due to a combination of COPD exacerbation and pulmonary edema. He definitely  needs to resume the Lasix. He also needs steroids and bronchodilators every 6 hours. The patient does not want to go back to the hospital. He states he gets worse every time he is in hospital. Therefore we'll try to treat the patient home. He is currently satting 95% on 4 L via nasal cannula. Therefore I increased his oxygen to 4 L until the patient is better. Also begin Lasix. Start with 80 mg x1 today and then 40  mg a day thereafter.  Obviously we'll need to recheck his renal function when I see the patient back on Monday. His creatinine was 2.4 on 12/31.  I also gave the patient 80 mg of Depo-Medrol x1 today.  He is then to begin a prednisone taper pack starting tomorrow. He will take 60 mg a day on day 1 and day 2, 40 mg a day on day 3 and day 4, 20 mg a day on day 5 and day 6.  He is to use albuterol 2 puffs every 4-6 hours as needed. If his situation worsens he must go to the emergency room immediately. I warned the patient that the combination of bronchodilators and prednisone may cause worsening of his tachycardia and possibly rapid ventricular response. However the patient has no choice as he needs this therapy due to his respiratory difficulty. Again he is adamant that he does not want to go the hospital today.

## 2013-09-19 ENCOUNTER — Emergency Department (HOSPITAL_COMMUNITY): Payer: Medicare Other

## 2013-09-19 ENCOUNTER — Inpatient Hospital Stay (HOSPITAL_COMMUNITY)
Admission: EM | Admit: 2013-09-19 | Discharge: 2013-09-29 | DRG: 291 | Disposition: A | Discharge status: Home Health | Payer: Medicare Other | Attending: Family Medicine | Admitting: Family Medicine

## 2013-09-19 ENCOUNTER — Encounter (HOSPITAL_COMMUNITY): Payer: Self-pay | Admitting: Emergency Medicine

## 2013-09-19 DIAGNOSIS — J449 Chronic obstructive pulmonary disease, unspecified: Secondary | ICD-10-CM

## 2013-09-19 DIAGNOSIS — J9611 Chronic respiratory failure with hypoxia: Secondary | ICD-10-CM | POA: Diagnosis present

## 2013-09-19 DIAGNOSIS — E139 Other specified diabetes mellitus without complications: Secondary | ICD-10-CM | POA: Diagnosis present

## 2013-09-19 DIAGNOSIS — J189 Pneumonia, unspecified organism: Secondary | ICD-10-CM

## 2013-09-19 DIAGNOSIS — Z7901 Long term (current) use of anticoagulants: Secondary | ICD-10-CM

## 2013-09-19 DIAGNOSIS — I059 Rheumatic mitral valve disease, unspecified: Secondary | ICD-10-CM | POA: Diagnosis present

## 2013-09-19 DIAGNOSIS — I129 Hypertensive chronic kidney disease with stage 1 through stage 4 chronic kidney disease, or unspecified chronic kidney disease: Secondary | ICD-10-CM | POA: Diagnosis present

## 2013-09-19 DIAGNOSIS — J441 Chronic obstructive pulmonary disease with (acute) exacerbation: Secondary | ICD-10-CM

## 2013-09-19 DIAGNOSIS — I5033 Acute on chronic diastolic (congestive) heart failure: Secondary | ICD-10-CM

## 2013-09-19 DIAGNOSIS — N183 Chronic kidney disease, stage 3 unspecified: Secondary | ICD-10-CM

## 2013-09-19 DIAGNOSIS — J9620 Acute and chronic respiratory failure, unspecified whether with hypoxia or hypercapnia: Secondary | ICD-10-CM

## 2013-09-19 DIAGNOSIS — T380X5A Adverse effect of glucocorticoids and synthetic analogues, initial encounter: Secondary | ICD-10-CM | POA: Diagnosis not present

## 2013-09-19 DIAGNOSIS — Z87891 Personal history of nicotine dependence: Secondary | ICD-10-CM

## 2013-09-19 DIAGNOSIS — K219 Gastro-esophageal reflux disease without esophagitis: Secondary | ICD-10-CM | POA: Diagnosis present

## 2013-09-19 DIAGNOSIS — Z79899 Other long term (current) drug therapy: Secondary | ICD-10-CM

## 2013-09-19 DIAGNOSIS — D649 Anemia, unspecified: Secondary | ICD-10-CM | POA: Diagnosis not present

## 2013-09-19 DIAGNOSIS — I5031 Acute diastolic (congestive) heart failure: Secondary | ICD-10-CM | POA: Diagnosis present

## 2013-09-19 DIAGNOSIS — D72829 Elevated white blood cell count, unspecified: Secondary | ICD-10-CM | POA: Diagnosis present

## 2013-09-19 DIAGNOSIS — I4891 Unspecified atrial fibrillation: Secondary | ICD-10-CM

## 2013-09-19 DIAGNOSIS — I10 Essential (primary) hypertension: Secondary | ICD-10-CM | POA: Diagnosis present

## 2013-09-19 DIAGNOSIS — I509 Heart failure, unspecified: Secondary | ICD-10-CM

## 2013-09-19 DIAGNOSIS — E873 Alkalosis: Secondary | ICD-10-CM | POA: Diagnosis present

## 2013-09-19 DIAGNOSIS — E875 Hyperkalemia: Secondary | ICD-10-CM | POA: Diagnosis present

## 2013-09-19 DIAGNOSIS — I70219 Atherosclerosis of native arteries of extremities with intermittent claudication, unspecified extremity: Secondary | ICD-10-CM

## 2013-09-19 DIAGNOSIS — N189 Chronic kidney disease, unspecified: Secondary | ICD-10-CM

## 2013-09-19 DIAGNOSIS — E785 Hyperlipidemia, unspecified: Secondary | ICD-10-CM | POA: Diagnosis present

## 2013-09-19 LAB — TROPONIN I
Troponin I: <0.3 ng/mL (ref <0.30)
Troponin I: <0.3 ng/mL (ref <0.30)
Troponin I: <0.3 ng/mL (ref <0.30)

## 2013-09-19 LAB — COMPREHENSIVE METABOLIC PANEL
ALT: 23 U/L (ref 0–53)
AST: 15 U/L (ref 0–37)
Albumin: 3 g/dL — ABNORMAL LOW (ref 3.5–5.2)
Alkaline Phosphatase: 88 U/L (ref 39–117)
BUN: 31 mg/dL — ABNORMAL HIGH (ref 6–23)
CO2: 31 meq/L (ref 19–32)
Calcium: 9 mg/dL (ref 8.4–10.5)
Chloride: 95 mEq/L — ABNORMAL LOW (ref 96–112)
Creatinine, Ser: 2.29 mg/dL — ABNORMAL HIGH (ref 0.50–1.35)
GFR, EST AFRICAN AMERICAN: 32 mL/min — AB (ref >90)
GFR, EST NON AFRICAN AMERICAN: 27 mL/min — AB (ref >90)
GLUCOSE: 162 mg/dL — AB (ref 70–99)
POTASSIUM: 5.2 meq/L (ref 3.7–5.3)
Sodium: 138 mEq/L (ref 137–147)
Total Bilirubin: 0.3 mg/dL (ref 0.3–1.2)
Total Protein: 6.9 g/dL (ref 6.0–8.3)

## 2013-09-19 LAB — CBC WITH DIFFERENTIAL/PLATELET
Basophils Absolute: 0 10*3/uL (ref 0.0–0.1)
Basophils Relative: 0 % (ref 0–1)
Eosinophils Absolute: 0 10*3/uL (ref 0.0–0.7)
Eosinophils Relative: 0 % (ref 0–5)
HCT: 37.9 % — ABNORMAL LOW (ref 39.0–52.0)
Hemoglobin: 12.4 g/dL — ABNORMAL LOW (ref 13.0–17.0)
LYMPHS ABS: 0.8 10*3/uL (ref 0.7–4.0)
LYMPHS PCT: 9 % — AB (ref 12–46)
MCH: 32 pg (ref 26.0–34.0)
MCHC: 32.7 g/dL (ref 30.0–36.0)
MCV: 97.9 fL (ref 78.0–100.0)
Monocytes Absolute: 0.7 10*3/uL (ref 0.1–1.0)
Monocytes Relative: 8 % (ref 3–12)
NEUTROS PCT: 83 % — AB (ref 43–77)
Neutro Abs: 7.7 10*3/uL (ref 1.7–7.7)
PLATELETS: 130 10*3/uL — AB (ref 150–400)
RBC: 3.87 MIL/uL — AB (ref 4.22–5.81)
RDW: 13.6 % (ref 11.5–15.5)
WBC: 9.3 10*3/uL (ref 4.0–10.5)

## 2013-09-19 LAB — POCT I-STAT TROPONIN I: Troponin i, poc: 0 ng/mL (ref 0.00–0.08)

## 2013-09-19 LAB — POCT I-STAT, CHEM 8
BUN: 41 mg/dL — AB (ref 6–23)
CALCIUM ION: 1.18 mmol/L (ref 1.13–1.30)
Chloride: 96 mEq/L (ref 96–112)
Creatinine, Ser: 2.3 mg/dL — ABNORMAL HIGH (ref 0.50–1.35)
Glucose, Bld: 164 mg/dL — ABNORMAL HIGH (ref 70–99)
HCT: 40 % (ref 39.0–52.0)
Hemoglobin: 13.6 g/dL (ref 13.0–17.0)
Potassium: 5.1 mEq/L (ref 3.7–5.3)
SODIUM: 135 meq/L — AB (ref 137–147)
TCO2: 35 mmol/L (ref 0–100)

## 2013-09-19 LAB — PRO B NATRIURETIC PEPTIDE: Pro B Natriuretic peptide (BNP): 6856 pg/mL — ABNORMAL HIGH (ref 0–125)

## 2013-09-19 LAB — PROTIME-INR
INR: 1.71 — ABNORMAL HIGH (ref 0.00–1.49)
Prothrombin Time: 19.6 seconds — ABNORMAL HIGH (ref 11.6–15.2)

## 2013-09-19 MED ORDER — SODIUM CHLORIDE 0.9 % IJ SOLN: 3.0000 mL | INTRAMUSCULAR | Status: DC | PRN

## 2013-09-19 MED ORDER — ALUM & MAG HYDROXIDE-SIMETH 200-200-20 MG/5ML PO SUSP: 30.0000 mL | Freq: Four times a day (QID) | ORAL | Status: DC | PRN

## 2013-09-19 MED ORDER — ACETAMINOPHEN 325 MG PO TABS: 650.0000 mg | ORAL_TABLET | Freq: Four times a day (QID) | ORAL | Status: DC | PRN

## 2013-09-19 MED ORDER — OXYCODONE HCL 5 MG PO TABS: 5.0000 mg | ORAL_TABLET | ORAL | Status: DC | PRN

## 2013-09-19 MED ORDER — FUROSEMIDE 10 MG/ML IJ SOLN
40.0000 mg | Freq: Once | INTRAMUSCULAR | Status: AC
  Administered 2013-09-19: 40 mg via INTRAVENOUS
  Filled 2013-09-19: qty 4

## 2013-09-19 MED ORDER — METHYLPREDNISOLONE SODIUM SUCC 125 MG IJ SOLR
125.0000 mg | Freq: Once | INTRAMUSCULAR | Status: AC
  Administered 2013-09-19: 125 mg via INTRAVENOUS
  Filled 2013-09-19: qty 2

## 2013-09-19 MED ORDER — RIVAROXABAN 15 MG PO TABS
15.0000 mg | ORAL_TABLET | Freq: Every day | ORAL | Status: DC
  Administered 2013-09-19 – 2013-09-21 (×3): 15 mg via ORAL
  Filled 2013-09-19 (×4): qty 1

## 2013-09-19 MED ORDER — FUROSEMIDE 10 MG/ML IJ SOLN
40.0000 mg | Freq: Two times a day (BID) | INTRAMUSCULAR | Status: DC
  Administered 2013-09-19: 40 mg via INTRAVENOUS
  Filled 2013-09-19 (×3): qty 4

## 2013-09-19 MED ORDER — GUAIFENESIN ER 600 MG PO TB12
1200.0000 mg | ORAL_TABLET | Freq: Two times a day (BID) | ORAL | Status: DC
  Administered 2013-09-19 – 2013-09-21 (×6): 1200 mg via ORAL
  Filled 2013-09-19 (×9): qty 2

## 2013-09-19 MED ORDER — SODIUM CHLORIDE 0.9 % IJ SOLN
3.0000 mL | Freq: Two times a day (BID) | INTRAMUSCULAR | Status: DC
  Administered 2013-09-19 – 2013-09-21 (×2): 3 mL via INTRAVENOUS

## 2013-09-19 MED ORDER — IPRATROPIUM-ALBUTEROL 0.5-2.5 (3) MG/3ML IN SOLN
3.0000 mL | RESPIRATORY_TRACT | Status: DC
  Administered 2013-09-19 (×4): 3 mL via RESPIRATORY_TRACT
  Filled 2013-09-19 (×2): qty 3

## 2013-09-19 MED ORDER — SODIUM CHLORIDE 0.9 % IV SOLN: 250.0000 mL | INTRAVENOUS | Status: DC | PRN

## 2013-09-19 MED ORDER — METOPROLOL SUCCINATE ER 50 MG PO TB24
50.0000 mg | ORAL_TABLET | Freq: Every day | ORAL | Status: DC
  Administered 2013-09-19 – 2013-09-21 (×3): 50 mg via ORAL
  Filled 2013-09-19 (×5): qty 1

## 2013-09-19 MED ORDER — AMIODARONE HCL 200 MG PO TABS
200.0000 mg | ORAL_TABLET | Freq: Two times a day (BID) | ORAL | Status: DC
  Administered 2013-09-19 – 2013-09-21 (×6): 200 mg via ORAL
  Filled 2013-09-19 (×9): qty 1

## 2013-09-19 MED ORDER — METHYLPREDNISOLONE SODIUM SUCC 40 MG IJ SOLR
40.0000 mg | Freq: Four times a day (QID) | INTRAMUSCULAR | Status: DC
  Administered 2013-09-19 – 2013-09-22 (×12): 40 mg via INTRAVENOUS
  Filled 2013-09-19 (×17): qty 1

## 2013-09-19 MED ORDER — ACETAMINOPHEN 650 MG RE SUPP: 650.0000 mg | Freq: Four times a day (QID) | RECTAL | Status: DC | PRN

## 2013-09-19 MED ORDER — IPRATROPIUM BROMIDE 0.02 % IN SOLN
1.0000 mg | Freq: Once | RESPIRATORY_TRACT | Status: AC
  Administered 2013-09-19: 1 mg via RESPIRATORY_TRACT
  Filled 2013-09-19: qty 5

## 2013-09-19 MED ORDER — FUROSEMIDE 10 MG/ML IJ SOLN: 60.0000 mg | Freq: Every day | INTRAMUSCULAR | Status: DC

## 2013-09-19 MED ORDER — ONDANSETRON HCL 4 MG/2ML IJ SOLN: 4.0000 mg | Freq: Four times a day (QID) | INTRAMUSCULAR | Status: DC | PRN

## 2013-09-19 MED ORDER — MOMETASONE FURO-FORMOTEROL FUM 100-5 MCG/ACT IN AERO
2.0000 | INHALATION_SPRAY | Freq: Two times a day (BID) | RESPIRATORY_TRACT | Status: DC
  Administered 2013-09-19 – 2013-09-22 (×7): 2 via RESPIRATORY_TRACT
  Filled 2013-09-19: qty 8.8

## 2013-09-19 MED ORDER — MORPHINE SULFATE 2 MG/ML IJ SOLN: 2.0000 mg | INTRAMUSCULAR | Status: DC | PRN

## 2013-09-19 MED ORDER — ALBUTEROL (5 MG/ML) CONTINUOUS INHALATION SOLN
15.0000 mg/h | INHALATION_SOLUTION | Freq: Once | RESPIRATORY_TRACT | Status: AC
  Administered 2013-09-19: 15 mg/h via RESPIRATORY_TRACT
  Filled 2013-09-19: qty 20

## 2013-09-19 MED ORDER — ONDANSETRON HCL 4 MG PO TABS: 4.0000 mg | ORAL_TABLET | Freq: Four times a day (QID) | ORAL | Status: DC | PRN

## 2013-09-19 MED ORDER — SODIUM CHLORIDE 0.9 % IJ SOLN
3.0000 mL | Freq: Two times a day (BID) | INTRAMUSCULAR | Status: DC
  Administered 2013-09-19 – 2013-09-21 (×7): 3 mL via INTRAVENOUS

## 2013-09-19 MED ORDER — ATORVASTATIN CALCIUM 40 MG PO TABS
40.0000 mg | ORAL_TABLET | Freq: Every day | ORAL | Status: DC
  Administered 2013-09-19 – 2013-09-21 (×3): 40 mg via ORAL
  Filled 2013-09-19 (×5): qty 1

## 2013-09-19 MED ORDER — DILTIAZEM HCL ER COATED BEADS 300 MG PO CP24
300.0000 mg | ORAL_CAPSULE | Freq: Every day | ORAL | Status: DC
  Administered 2013-09-19 – 2013-09-21 (×3): 300 mg via ORAL
  Filled 2013-09-19 (×4): qty 1

## 2013-09-19 NOTE — ED Provider Notes (Signed)
CSN: [id number]     Arrival date & time 09/19/13  4098 History   First MD Initiated Contact with Patient 09/19/13 [phone]     Chief Complaint  Patient presents with  . Shortness of Breath   (Consider location/radiation/quality/duration/timing/severity/associated sxs/prior Treatment) The history is provided by the patient.  ABHIJAY MORRISS is a 68 y.o. male hx of GERD, HL, afib on coumadin, COPD, CHF here with SOB. He was recently admitted for COPD exacerbation and pneumonia. He was discharged about a week ago and finished antibiotics and corticosteroids. He went for his follow up with the his doctor yesterday. He was thought to be in COPD exacerbation again was started on steroids yesterday. However overnight he was unable to sleep due to shortness of breath. Denies any chest pain or cough or fever. He has multiple admissions recently for COPD. Hasn't been smoking for the last 2 months. He is on 4L [place] at home.    Past Medical History  Diagnosis Date  . GERD (gastroesophageal reflux disease)   . Hyperlipidemia   . Atrial fibrillation     stress test 10/09/12  . H/O hiatal hernia   . Hypertension     sees Dr. Lynnea Ferrier  . AAA (abdominal aortic aneurysm)   . COPD (chronic obstructive pulmonary disease)   . CHF (congestive heart failure)   . Pneumonia   . Exertional shortness of breath     "just recently" (09/08/2013)  . Arthritis     "little in my hands" (09/08/2013)  . Chronic kidney disease (CKD), stage III (moderate)     Hattie Perch 09/08/2013   Past Surgical History  Procedure Laterality Date  . Knee arthroscopy Right   . Abdominal aortic aneurysm repair  10/15/2012    Procedure: ANEURYSM ABDOMINAL AORTIC REPAIR;  Surgeon: Pryor Ochoa, MD;  Location: [facility] OR;  Service: Vascular;  Laterality: N/A;  Resection and Grafting of Abdominal Aortic Aneurysm - Aortobi-Iliac    Family History  Problem Relation Age of Onset  . Diabetes Mother   . Heart disease Father   . Heart attack Father     History  Substance Use Topics  . Smoking status: Former Smoker -- 1.00 packs/day for 56 years    Types: Cigarettes  . Smokeless tobacco: Former Neurosurgeon    Types: Chew     Comment: 09/08/2013 "stopped smoking cigarettes ~ 2 months ago; aien't chew in probably 10 yrs"  . Alcohol Use: No    Review of Systems  Respiratory: Positive for shortness of breath.   All other systems reviewed and are negative.    Allergies  Niaspan  Home Medications   Current Outpatient Rx  Name  Route  Sig  Dispense  Refill  . albuterol (PROVENTIL HFA;VENTOLIN HFA) 108 (90 BASE) MCG/ACT inhaler   Inhalation   Inhale 2 puffs into the lungs every 6 (six) hours as needed for wheezing or shortness of breath.   1 Inhaler   2     ProAir Inhaler C/O to pharmacy   . amiodarone (PACERONE) 200 MG tablet   Oral   Take 1 tablet (200 mg total) by mouth 2 (two) times daily.   30 tablet   0   . atorvastatin (LIPITOR) 40 MG tablet   Oral   Take 1 tablet (40 mg total) by mouth daily.   30 tablet   5   . diltiazem (CARDIZEM CD) 300 MG 24 hr capsule   Oral   Take 1 capsule (300 mg total) by  mouth daily.   30 capsule   0   . furosemide (LASIX) 40 MG tablet   Oral   Take 1 tablet (40 mg total) by mouth daily.   30 tablet   3   . metoprolol succinate (TOPROL-XL) 25 MG 24 hr tablet   Oral   Take 1 tablet (25 mg total) by mouth every evening.   30 tablet   0   . metoprolol succinate (TOPROL-XL) 50 MG 24 hr tablet   Oral   Take 1 tablet (50 mg total) by mouth daily after breakfast. Take with or immediately following a meal.   30 tablet   0   . omeprazole (PRILOSEC) 40 MG capsule   Oral   Take 1 capsule (40 mg total) by mouth 2 (two) times daily.   60 capsule   1   . Rivaroxaban (XARELTO) 15 MG TABS tablet   Oral   Take 1 tablet (15 mg total) by mouth daily with supper.   30 tablet   0   . predniSONE (DELTASONE) 20 MG tablet      3 tabs poqday 1-2, 2 tabs poqday 3-4, 1 poqday 5-6   12  tablet   0    BP 133/85  Pulse 84  Temp(Src) 98.1 F (36.7 C) (Oral)  Resp 19  SpO2 100% Physical Exam  Nursing note and vitals reviewed. Constitutional: He is oriented to person, place, and time.  Chronically ill, tachypneic   HENT:  Head: Normocephalic.  Mouth/Throat: Oropharynx is clear and moist.  Eyes: Conjunctivae are normal. Pupils are equal, round, and reactive to light.  Neck: Normal range of motion. Neck supple.  Cardiovascular: Normal rate, regular rhythm and normal heart sounds.   Pulmonary/Chest:  Tachypneic, talking 2-3 word sentences. + diffuse wheezing. No crackles. Mild retractions and some accessory muscle use   Abdominal: Soft. Bowel sounds are normal. He exhibits no distension. There is no tenderness. There is no rebound and no guarding.  Musculoskeletal: Normal range of motion.  2+ edema bilateral legs   Neurological: He is alert and oriented to person, place, and time.  Skin: Skin is warm and dry.  Psychiatric: He has a normal mood and affect. His behavior is normal. Judgment and thought content normal.    ED Course  Procedures (including critical care time) Labs Review Labs Reviewed  CBC WITH DIFFERENTIAL - Abnormal; Notable for the following:    RBC 3.87 (*)    Hemoglobin 12.4 (*)    HCT 37.9 (*)    Platelets 130 (*)    Neutrophils Relative % 83 (*)    Lymphocytes Relative 9 (*)    All other components within normal limits  COMPREHENSIVE METABOLIC PANEL - Abnormal; Notable for the following:    Chloride 95 (*)    Glucose, Bld 162 (*)    BUN 31 (*)    Creatinine, Ser 2.29 (*)    Albumin 3.0 (*)    GFR calc non Af Amer 27 (*)    GFR calc Af Amer 32 (*)    All other components within normal limits  PRO B NATRIURETIC PEPTIDE - Abnormal; Notable for the following:    Pro B Natriuretic peptide (BNP) 6856.0 (*)    All other components within normal limits  PROTIME-INR - Abnormal; Notable for the following:    Prothrombin Time 19.6 (*)    INR  1.71 (*)    All other components within normal limits  POCT I-STAT, CHEM 8 - Abnormal; Notable for  the following:    Sodium 135 (*)    BUN 41 (*)    Creatinine, Ser 2.30 (*)    Glucose, Bld 164 (*)    All other components within normal limits  POCT I-STAT TROPONIN I   Imaging Review Dg Chest Portable 1 View  09/19/2013   CLINICAL DATA:  Shortness of breath  EXAM: PORTABLE CHEST - 1 VIEW  COMPARISON:  09/10/2013  FINDINGS: Small bilateral pleural effusions, left more than right. The volume may have mildly increased from before. There is also increased lung density at the left base. Reticular opacities at the right base appears similar to prior. Normal heart size.  IMPRESSION: 1. Increased lung opacification at the left base, which could represent pneumonia or pneumonitis. 2. Small bilateral pleural effusions.   Electronically Signed   By: Tiburcio Pea M.D.   On: 09/19/2013 06:53    EKG Interpretation    Date/Time:  Saturday September 19 2013 06:09:53 EST Ventricular Rate:  105 PR Interval:    QRS Duration: 90 QT Interval:  389 QTC Calculation: 514 R Axis:   7 Text Interpretation:  Atrial fibrillation Borderline repolarization abnormality Prolonged QT interval No significant change since last tracing Confirmed by Elvin Mccartin  MD, Prudie Guthridge [id number]) on 09/19/2013 6:15:50 AM            MDM   1. COPD (chronic obstructive pulmonary disease)   2. CHF (congestive heart failure)    TORIANO AIKEY is a 68 y.o. male here with SOB. Likely COPD vs CHF exacerbation vs pneumonia. Will get labs, EKG, BNP, CXR. Will place on continuous nebs, IV steroids. He has increased oxygen requirement and will likely need admission.   7:26 AM CXR showed increased effusion possible pneumonia. However, patient has no fever and has nl WBC count. He was recently treated for HCAP. BNP elevated. I think he likely has CHF and COPD. I held off on abx for now.  Will admit to tele.     Richardean Canal, MD 09/19/13 [phone]

## 2013-09-19 NOTE — ED Notes (Signed)
Attempted to call report again.

## 2013-09-19 NOTE — ED Notes (Signed)
Pt. Was recently released from hospital. Pt. Was seen by PMD yesterday because he felt his breathing has gotten worse over in the past few days.

## 2013-09-19 NOTE — Consult Note (Signed)
Primary cardiologist: Dr. Cassell Clement Consulting cardiologist: Dr. Jonelle Sidle  Clinical Summary Mark [name] is a 68 y.o.male now readmitted to the hospital with evidence of volume overload, probable acute on chronic diastolic heart failure in the face of atrial fibrillation. Recent hospital admission was reviewed. He was not discharged on diuretic at that time in light of acute on chronic renal failure. Weight is up approximately 10 pounds. He states that he woke up this morning after being more short of breath this week, and had particular difficulty with his breathing while lying supine. He has persistent palpitations, no chest pain. Reports compliance with his other medications.  ECG confirms atrial fibrillation, although rate is controlled. Creatinine is 2.3 which is down from a peak of 3.4. Troponin I level is negative.   Allergies  Allergen Reactions  . Niaspan [Niacin Er] Anaphylaxis    Medications Scheduled Medications: . amiodarone  200 mg Oral BID  . atorvastatin  40 mg Oral q1800  . diltiazem  300 mg Oral Daily  . furosemide  60 mg Intravenous Daily  . guaiFENesin  1,200 mg Oral BID  . ipratropium-albuterol  3 mL Nebulization Q4H  . methylPREDNISolone (SOLU-MEDROL) injection  40 mg Intravenous Q6H  . metoprolol succinate  50 mg Oral QPC breakfast  . mometasone-formoterol  2 puff Inhalation BID  . Rivaroxaban  15 mg Oral Q supper  . sodium chloride  3 mL Intravenous Q12H  . sodium chloride  3 mL Intravenous Q12H    PRN Medications: sodium chloride, acetaminophen, acetaminophen, alum & mag hydroxide-simeth, morphine injection, ondansetron (ZOFRAN) IV, ondansetron, oxyCODONE, sodium chloride   Past Medical History  Diagnosis Date  . GERD (gastroesophageal reflux disease)   . Hyperlipidemia   . Atrial fibrillation     Stress test 10/09/12  . H/O hiatal hernia   . Hypertension     Sees Dr. Lynnea Ferrier  . AAA (abdominal aortic aneurysm)   . COPD  (chronic obstructive pulmonary disease)   . Diastolic heart failure   . Pneumonia   . Arthritis     "little in my hands" (09/08/2013)  . Chronic kidney disease (CKD), stage III (moderate)     Past Surgical History  Procedure Laterality Date  . Knee arthroscopy Right   . Abdominal aortic aneurysm repair  10/15/2012    Procedure: ANEURYSM ABDOMINAL AORTIC REPAIR;  Surgeon: Pryor Ochoa, MD;  Location: [facility] OR;  Service: Vascular;  Laterality: N/A;  Resection and Grafting of Abdominal Aortic Aneurysm - Aortobi-Iliac     Family History  Problem Relation Age of Onset  . Diabetes Mother   . Heart disease Father   . Heart attack Father     Social History Mark [name] reports that he has quit smoking. His smoking use included Cigarettes. He has a 56 pack-year smoking history. He has quit using smokeless tobacco. His smokeless tobacco use included Chew. Mark [name] reports that he does not drink alcohol.  Review of Systems No bleeding problems on Xarelto. Increasing leg edema. Otherwise as outlined above.  Physical Examination Blood pressure 151/89, pulse 69, temperature 97.4 F (36.3 C), temperature source Oral, resp. rate 20, height 5' 6.5" (1.689 m), weight 155 lb (70.308 kg), SpO2 97.00%.  Intake/Output Summary (Last 24 hours) at 09/19/13 1207 Last data filed at 09/19/13 0816  Gross per 24 hour  Intake      0 ml  Output    100 ml  Net   -100 ml   Telemetry: Atrial  fibrillation.  Patient appears comfortable. HEENT: Conjunctiva and lids normal, oropharynx clear. Neck: Supple, elevated JVP or carotid bruits, no thyromegaly. Lungs: Decreased breath sounds at the bases, nonlabored breathing at rest. Cardiac: Irregularly irregular, no S3 or significant systolic murmur, no pericardial rub. Abdomen: Soft, nontender, bowel sounds present, no guarding or rebound. Extremities: 2+ pitting edema, distal pulses 2+. Skin: Warm and dry. Musculoskeletal: No kyphosis. Neuropsychiatric: Alert  and oriented x3, affect grossly appropriate.   Lab Results  Basic Metabolic Panel:  Recent Labs Lab 09/16/13 1041 09/19/13 0628 09/19/13 0634  NA 134* 138 135*  K 5.0 5.2 5.1  CL 96 95* 96  CO2 33* 31  --   GLUCOSE 209* 162* 164*  BUN 31* 31* 41*  CREATININE 2.4* 2.29* 2.30*  CALCIUM 8.5 9.0  --     Liver Function Tests:  Recent Labs Lab 09/19/13 0628  AST 15  ALT 23  ALKPHOS 88  BILITOT 0.3  PROT 6.9  ALBUMIN 3.0*    CBC:  Recent Labs Lab 09/16/13 1041 09/19/13 0628 09/19/13 0634  WBC 9.1 9.3  --   NEUTROABS  --  7.7  --   HGB 11.7* 12.4* 13.6  HCT 35.7* 37.9* 40.0  MCV 96.6 97.9  --   PLT 209.0 130*  --     Cardiac Enzymes:  Recent Labs Lab 09/19/13 1040  TROPONINI <0.30    ECG Rate-controlled atrial fibrillation with nonspecific ST changes.  Imaging PORTABLE CHEST - 1 VIEW  COMPARISON: 09/10/2013  FINDINGS: Small bilateral pleural effusions, left more than right. The volume may have mildly increased from before. There is also increased lung density at the left base. Reticular opacities at the right base appears similar to prior. Normal heart size.  IMPRESSION: 1. Increased lung opacification at the left base, which could represent pneumonia or pneumonitis. 2. Small bilateral pleural effusions.   Impression  1. Acute on chronic diastolic heart failure, not on outpatient diuretic since recent hospital discharge in the face of recent acute on chronic renal failure. approximally 10 pound weight gain. Also complicated by persistent atrial fibrillation.  2. Persistent atrial fibrillation, rate is reasonably well controlled. Currently on Xarelto, amiodarone, Cardizem CD, and metoprolol. Presumably pending discussion about elective cardioversion. He was to see Dr. Patty SermonsBrackbill this week.  3. CKD, stage 4. Current creatinine 2.3.  4. Hyperlipidemia, on statin therapy.  5. COPD.   Recommendations  Continue current cardiac regimen for  management of atrial fibrillation. Will initiate Lasix at 40 mg IV twice daily (was on 60 mg twice daily when he had acute renal insufficiency over her baseline). Key will be determining a standing dose that he can tolerate without deterioration in renal function. May also be worth considering arranging cardioversion of his atrial fibrillation sooner rather than later as this may help also with management of diastolic heart failure.   Jonelle SidleSamuel G. Rawson Minix, M.D., F.A.C.C.

## 2013-09-19 NOTE — ED Notes (Signed)
Pt finished neb. treatment. 

## 2013-09-19 NOTE — H&P (Signed)
[facility] History and Physical  Mark [name] ZOX:[id number] DOB: Jan 22, 1946 DOA: 09/19/2013  Referring physician:  PCP: Leo Grosser, MD   Chief Complaint: Shortness of breath/Extremity Edema  HPI: Mark [name] is a 68 y.o. male with a past medical history of COPD, stage III chronic kidney disease, atrial fibrillation, diastolic congestive heart failure who has had multiple hospitalizations in the past month. He was recently admitted on 09/08/2013 discharged on 09/12/2013 at which time he presented with acute on chronic diastolic congestive heart failure. The hospitalization was complicated by the development of acute on chronic renal failure secondary to diuresis as his creatinine peaked at 3.49 on 09/11/2013. For this reason his diuretics were held. Patient was not discharged on diuretic therapy. since discharge he has had progressive dyspnea at rest, cough associated with white sputum production, wheezing, worsening of bilateral lower extremity edema, 6 pound weight gain in 1 week. He denies chest pain, fevers chills, nausea or vomiting, abdominal pain, syncope, presyncope, bloody stools. initial workup in the emergency department showed a BNP of 6856. EKG showed atrial fibrillation.  patient noted to be an respiratory distress and was administered  supplemental oxygen via nonrebreather. I have discussed case with Dr. Diona Browner of cardiology who will consult.                                                                                                                                                                   Review of Systems:  Constitutional:  No weight loss, night sweats, Fevers, chills, fatigue.  HEENT:  No headaches, Difficulty swallowing,Tooth/dental problems,Sore throat,  No sneezing, itching, ear ache, nasal congestion, post nasal drip,  Cardio-vascular:  No chest pain, Orthopnea, dizziness, palpitations  GI:  No heartburn, indigestion, abdominal pain,  nausea, vomiting, diarrhea, change in bowel habits, loss of appetite  Resp:  Positive for shortness of breath with exertion and at rest, productive cough with white sputum production.  No coughing up of blood.No change in color of mucus.No wheezing.No chest wall deformity  Skin:  no rash or lesions.  GU:  no dysuria, change in color of urine, no urgency or frequency. No flank pain.  Musculoskeletal:  Positive for bilateral extremity pitting edema. No decreased range of motion. No back pain.  Psych:  No change in mood or affect. No depression or anxiety. No memory loss.   Past Medical History  Diagnosis Date  . GERD (gastroesophageal reflux disease)   . Hyperlipidemia   . Atrial fibrillation     stress test 10/09/12  . H/O hiatal hernia   . Hypertension     sees Dr. Lynnea Ferrier  . AAA (abdominal aortic aneurysm)   . COPD (chronic obstructive pulmonary disease)   . CHF (congestive heart failure)   .  Pneumonia   . Exertional shortness of breath     "just recently" (09/08/2013)  . Arthritis     "little in my hands" (09/08/2013)  . Chronic kidney disease (CKD), stage III (moderate)     Hattie Perch 09/08/2013   Past Surgical History  Procedure Laterality Date  . Knee arthroscopy Right   . Abdominal aortic aneurysm repair  10/15/2012    Procedure: ANEURYSM ABDOMINAL AORTIC REPAIR;  Surgeon: Pryor Ochoa, MD;  Location: [facility] OR;  Service: Vascular;  Laterality: N/A;  Resection and Grafting of Abdominal Aortic Aneurysm - Aortobi-Iliac    Social History:  reports that he has quit smoking. His smoking use included Cigarettes. He has a 56 pack-year smoking history. He has quit using smokeless tobacco. His smokeless tobacco use included Chew. He reports that he does not drink alcohol or use illicit drugs.  Allergies  Allergen Reactions  . Niaspan [Niacin Er] Anaphylaxis    Family History  Problem Relation Age of Onset  . Diabetes Mother   . Heart disease Father   . Heart attack Father       Prior to Admission medications   Medication Sig Start Date End Date Taking? Authorizing Provider  albuterol (PROVENTIL HFA;VENTOLIN HFA) 108 (90 BASE) MCG/ACT inhaler Inhale 2 puffs into the lungs every 6 (six) hours as needed for wheezing or shortness of breath. 08/18/13  Yes Donita Brooks, MD  amiodarone (PACERONE) 200 MG tablet Take 1 tablet (200 mg total) by mouth 2 (two) times daily. 09/12/13  Yes Elease Etienne, MD  atorvastatin (LIPITOR) 40 MG tablet Take 1 tablet (40 mg total) by mouth daily. 09/18/13  Yes Donita Brooks, MD  diltiazem (CARDIZEM CD) 300 MG 24 hr capsule Take 1 capsule (300 mg total) by mouth daily. 09/12/13  Yes Elease Etienne, MD  furosemide (LASIX) 40 MG tablet Take 1 tablet (40 mg total) by mouth daily. 09/18/13  Yes Donita Brooks, MD  metoprolol succinate (TOPROL-XL) 25 MG 24 hr tablet Take 1 tablet (25 mg total) by mouth every evening. 09/12/13  Yes Elease Etienne, MD  metoprolol succinate (TOPROL-XL) 50 MG 24 hr tablet Take 1 tablet (50 mg total) by mouth daily after breakfast. Take with or immediately following a meal. 09/12/13  Yes Elease Etienne, MD  omeprazole (PRILOSEC) 40 MG capsule Take 1 capsule (40 mg total) by mouth 2 (two) times daily. 08/17/13  Yes Kathlen Mody, MD  Rivaroxaban (XARELTO) 15 MG TABS tablet Take 1 tablet (15 mg total) by mouth daily with supper. 09/12/13  Yes Elease Etienne, MD  predniSONE (DELTASONE) 20 MG tablet 3 tabs poqday 1-2, 2 tabs poqday 3-4, 1 poqday 5-6 09/18/13   Donita Brooks, MD   Physical Exam: Filed Vitals:   09/19/13 0800  BP: 126/89  Pulse:   Temp:   Resp: 20    BP 126/89  Pulse 84  Temp(Src) 98.1 F (36.7 C) (Oral)  Resp 20  SpO2 100%  General:  Patient in mild respiratory distress, Eyes: PERRL, normal lids, irises & conjunctiva ENT: grossly normal hearing, lips & tongue Neck: no LAD, masses or thyromegaly Cardiovascular: Irregular rate and rhythm normal S1-S2 no murmurs rubs or gallops  tachycardic Telemetry: Atrial fibrillation Respiratory: Patient having by basilar crackles, positive rales, expiratory wheezes, diminished breath sounds bilaterally. Abdomen: soft, ntnd Skin: no rash or induration seen on limited exam Musculoskeletal: 3+ bilateral extremity pitting edema Psychiatric: grossly normal mood and affect, speech fluent and appropriate Neurologic: grossly  non-focal.          Labs on Admission:  Basic Metabolic Panel:  Recent Labs Lab 09/16/13 1041 09/19/13 0628 09/19/13 0634  NA 134* 138 135*  K 5.0 5.2 5.1  CL 96 95* 96  CO2 33* 31  --   GLUCOSE 209* 162* 164*  BUN 31* 31* 41*  CREATININE 2.4* 2.29* 2.30*  CALCIUM 8.5 9.0  --    Liver Function Tests:  Recent Labs Lab 09/19/13 0628  AST 15  ALT 23  ALKPHOS 88  BILITOT 0.3  PROT 6.9  ALBUMIN 3.0*   No results found for this basename: LIPASE, AMYLASE,  in the last 168 hours No results found for this basename: AMMONIA,  in the last 168 hours CBC:  Recent Labs Lab 09/16/13 1041 09/19/13 0628 09/19/13 0634  WBC 9.1 9.3  --   NEUTROABS  --  7.7  --   HGB 11.7* 12.4* 13.6  HCT 35.7* 37.9* 40.0  MCV 96.6 97.9  --   PLT 209.0 130*  --    Cardiac Enzymes: No results found for this basename: CKTOTAL, CKMB, CKMBINDEX, TROPONINI,  in the last 168 hours  BNP (last 3 results)  Recent Labs  09/08/13 1032 09/19/13 0628  PROBNP 5402.0* 6856.0*   CBG: No results found for this basename: GLUCAP,  in the last 168 hours  Radiological Exams on Admission: Dg Chest Portable 1 View  09/19/2013   CLINICAL DATA:  Shortness of breath  EXAM: PORTABLE CHEST - 1 VIEW  COMPARISON:  09/10/2013  FINDINGS: Small bilateral pleural effusions, left more than right. The volume may have mildly increased from before. There is also increased lung density at the left base. Reticular opacities at the right base appears similar to prior. Normal heart size.  IMPRESSION: 1. Increased lung opacification at the left  base, which could represent pneumonia or pneumonitis. 2. Small bilateral pleural effusions.   Electronically Signed   By: Tiburcio Pea M.D.   On: 09/19/2013 06:53    EKG: Independently reviewed. Atrial fibrillation  Assessment/Plan Active Problems:   Acute diastolic heart failure   HTN (hypertension)   Atrial fibrillation   Long term (current) use of anticoagulants   Acute on chronic diastolic CHF (congestive heart failure)   COPD (chronic obstructive pulmonary disease)   Respiratory failure   Chronic kidney disease (CKD), stage III (moderate)   1. Acute on chronic diastolic congestive heart failure. Patient presenting with clinical signs and symptoms consistent with acute decompensated congestive heart failure having of worsening dyspnea at rest, 6 pound weight gain, increasing bilateral extremity pitting edema. Initial lab work in emergent apartment showed an elevated BNP of 6856. He had not been on Lasix therapy recently. I discussed case with cardiology who will consult. Will restart Lasix at 60 mg IV daily, obtain daily weights, strict ins and outs, continue beta blocker therapy, cycle troponin. 2. Acute on chronic respiratory failure, likely secondary to combined congestive heart failure and COPD exacerbation. Patient initially presented with an O2 sat of 80% breathing 22 breaths per minute. Provide supplemental oxygen, 3 COPD and heart failure. 3. Chronic obstructive pulmonary disease exacerbation. I suspect that underlying CHF may precipitating COPD exacerbation. Will start him on steroids with Solu-Medrol 40 mg IV Q8 hours, duo nebs, inhaled steroids. Patient has received antimicrobial therapy on recent hospitalizations, he is afebrile with white count within normal range. Will monitor closely, reassess the need for antibiotic therapy in a.m. 4. Atrial fibrillation. Will continue amiodarone 200 mg  by mouth twice a day, metoprolol 25 mg daily and Cardizem 300 mg by mouth daily.  Continue anticoagulation.  5. Stage III chronic kidney disease. Patient's baseline creatinine near 1.3, 1.4. He presently appears to be at his baseline. We'll need to monitor closely given the need for IV diuresis at this point in time do to acute decompensated heart failure. 6. Hypertension. Continue Cardizem and metoprolol therapy. 7. Nutrition. Place patient on low sodium heart healthy diet 8. DVT prophylaxis. Patient fully anticoagulated   Code Status: Full Code Family Communication: I spoke with family members at bedside Disposition Plan: I anticipate patient will require greater than 2 nights hospitalization  Time spent: 8370  Jeralyn BennettZAMORA, Venson Ferencz [facility] Pager [phone]

## 2013-09-19 NOTE — ED Notes (Signed)
Attempted to call report, RN unavailable.

## 2013-09-20 DIAGNOSIS — J189 Pneumonia, unspecified organism: Secondary | ICD-10-CM

## 2013-09-20 DIAGNOSIS — J449 Chronic obstructive pulmonary disease, unspecified: Secondary | ICD-10-CM

## 2013-09-20 LAB — BASIC METABOLIC PANEL
BUN: 44 mg/dL — AB (ref 6–23)
CHLORIDE: 95 meq/L — AB (ref 96–112)
CO2: 29 mEq/L (ref 19–32)
CREATININE: 2.75 mg/dL — AB (ref 0.50–1.35)
Calcium: 8.7 mg/dL (ref 8.4–10.5)
GFR calc non Af Amer: 22 mL/min — ABNORMAL LOW (ref >90)
GFR, EST AFRICAN AMERICAN: 25 mL/min — AB (ref >90)
Glucose, Bld: 187 mg/dL — ABNORMAL HIGH (ref 70–99)
Potassium: 5.1 mEq/L (ref 3.7–5.3)
Sodium: 136 mEq/L — ABNORMAL LOW (ref 137–147)

## 2013-09-20 LAB — CBC
HEMATOCRIT: 29.3 % — AB (ref 39.0–52.0)
Hemoglobin: 9.9 g/dL — ABNORMAL LOW (ref 13.0–17.0)
MCH: 32 pg (ref 26.0–34.0)
MCHC: 33.8 g/dL (ref 30.0–36.0)
MCV: 94.8 fL (ref 78.0–100.0)
Platelets: 129 10*3/uL — ABNORMAL LOW (ref 150–400)
RBC: 3.09 MIL/uL — ABNORMAL LOW (ref 4.22–5.81)
RDW: 13.5 % (ref 11.5–15.5)
WBC: 10.3 10*3/uL (ref 4.0–10.5)

## 2013-09-20 MED ORDER — FUROSEMIDE 10 MG/ML IJ SOLN
40.0000 mg | Freq: Every day | INTRAMUSCULAR | Status: DC
  Administered 2013-09-21: 10:00:00 40 mg via INTRAVENOUS
  Filled 2013-09-20: qty 4

## 2013-09-20 MED ORDER — AZITHROMYCIN 500 MG PO TABS
500.0000 mg | ORAL_TABLET | Freq: Every day | ORAL | Status: AC
  Administered 2013-09-20: 500 mg via ORAL
  Filled 2013-09-20: qty 1

## 2013-09-20 MED ORDER — IPRATROPIUM-ALBUTEROL 0.5-2.5 (3) MG/3ML IN SOLN
3.0000 mL | Freq: Four times a day (QID) | RESPIRATORY_TRACT | Status: DC
  Administered 2013-09-20 – 2013-09-22 (×9): 3 mL via RESPIRATORY_TRACT
  Filled 2013-09-20 (×7): qty 3

## 2013-09-20 MED ORDER — IPRATROPIUM-ALBUTEROL 0.5-2.5 (3) MG/3ML IN SOLN: 3.0000 mL | RESPIRATORY_TRACT | Status: DC | PRN

## 2013-09-20 MED ORDER — AZITHROMYCIN 250 MG PO TABS
250.0000 mg | ORAL_TABLET | Freq: Every day | ORAL | Status: DC
Start: 2013-09-21 — End: 2013-09-22
  Administered 2013-09-21: 10:00:00 250 mg via ORAL
  Filled 2013-09-20 (×2): qty 1

## 2013-09-20 NOTE — Progress Notes (Addendum)
ANTIBIOTIC CONSULT NOTE - INITIAL  Pharmacy Consult for Azithromycin Indication: COPD exacerbation/ rule out pneumonia  Allergies  Allergen Reactions  . Niaspan [Niacin Er] Anaphylaxis    Patient Measurements: Height: 5' 6.5" (168.9 cm) Weight: 154 lb 9.6 oz (70.126 kg) (scale C) IBW/kg (Calculated) : 64.95  Vital Signs: Temp: 98.4 F (36.9 C) (01/04 1007) Temp src: Oral (01/04 1007) BP: 119/70 mmHg (01/04 1007) Pulse Rate: 95 (01/04 1007) Intake/Output from previous day: 01/03 0701 - 01/04 0700 In: 483 [P.O.:480; I.V.:3] Out: 850 [Urine:850] Intake/Output from this shift: Total I/O In: 240 [P.O.:240] Out: 425 [Urine:425]  Labs:  Recent Labs  09/19/13 0628 09/19/13 0634 09/20/13 0420  WBC 9.3  --  10.3  HGB 12.4* 13.6 9.9*  PLT 130*  --  129*  CREATININE 2.29* 2.30* 2.75*   Estimated Creatinine Clearance: 23.3 ml/min (by C-G formula based on Cr of 2.75). No results found for this basename: VANCOTROUGH, VANCOPEAK, VANCORANDOM, GENTTROUGH, GENTPEAK, GENTRANDOM, TOBRATROUGH, TOBRAPEAK, TOBRARND, AMIKACINPEAK, AMIKACINTROU, AMIKACIN,  in the last 72 hours   Microbiology: No results found for this or any previous visit (from the past 720 hour(s)).  Medical History: Past Medical History  Diagnosis Date  . GERD (gastroesophageal reflux disease)   . Hyperlipidemia   . Atrial fibrillation     Stress test 10/09/12  . H/O hiatal hernia   . Hypertension     Sees Dr. Lynnea FerrierWarren Pickard  . AAA (abdominal aortic aneurysm)   . COPD (chronic obstructive pulmonary disease)   . Diastolic heart failure   . Pneumonia   . Arthritis     "little in my hands" (09/08/2013)  . Chronic kidney disease (CKD), stage III (moderate)     Medications:  Scheduled:  . amiodarone  200 mg Oral BID  . atorvastatin  40 mg Oral q1800  . diltiazem  300 mg Oral Daily  . [START ON 09/21/2013] furosemide  40 mg Intravenous Daily  . guaiFENesin  1,200 mg Oral BID  . ipratropium-albuterol  3 mL  Nebulization QID  . methylPREDNISolone (SOLU-MEDROL) injection  40 mg Intravenous Q6H  . metoprolol succinate  50 mg Oral QPC breakfast  . mometasone-formoterol  2 puff Inhalation BID  . Rivaroxaban  15 mg Oral Q supper  . sodium chloride  3 mL Intravenous Q12H  . sodium chloride  3 mL Intravenous Q12H   Assessment: 68 yo M presenting with SOB/volume overload in setting of probable CHF exacerbation to start Azithromycin for a COPD exacerbation/?pneumonia. Pt is currently afebrile, WBC wnl, SCr increased from baseline (1.3-1.4) to 2.75 with est CrCl ~23 ml/min. Note potential interaction with amiodarone for increased risk of QT prolongation, will monitor.   Goal of Therapy:  Eradication of infection  Plan:  - Start Azithromycin 500 mg PO x 1, followed by 250 mg PO x 5 days total - Monitor temp, WBC, QTc, clinical status  Margie BilletErika K. von Vajna, PharmD Clinical Pharmacist - Resident Pager: [phone] Pharmacy: [phone] 09/20/2013 10:31 AM

## 2013-09-20 NOTE — Progress Notes (Signed)
Patient complaining of a "scratchy throat" and feels like he is "getting a cold."  Pt. Declines anything for it at the present time, just wants staff to be aware.  Will continue to monitor patient.

## 2013-09-20 NOTE — Discharge Instructions (Signed)
Information on my medicine - XARELTO (Rivaroxaban)  This medication education was reviewed with me or my healthcare representative as part of my discharge preparation.  The pharmacist that spoke with me during my hospital stay was:  Von Beacher MayVajna, Damyon Mullane K, RPH  Why was Xarelto prescribed for you? Xarelto was prescribed for you to reduce the risk of a blood clots forming after orthopedic surgery OR to reduce the risk of forming blood clots that cause a stroke if you have a medical condition called atrial fibrillation (a type of irregular heartbeat).  What do you need to know about xarelto ? Take your Xarelto ONCE DAILY at the same time every day with your evening meal. If you have difficulty swallowing the tablet whole, you may crush it and mix in applesauce just prior to taking your dose.  Take Xarelto exactly as prescribed by your doctor and DO NOT stop taking Xarelto without talking to the doctor who prescribed the medication.  Stopping without other stroke or VTE prevention medication to take the place of Xarelto may increase your risk of developing a new clot or stroke.  Refill your prescription before you run out.  After discharge, you should have regular check-up appointments with your healthcare provider that is prescribing your Xarelto.  In the future your dose may need to be changed if your kidney function or weight changes by a significant amount.  What do you do if you miss a dose? If you are taking Xarelto ONCE DAILY and you miss a dose, take it as soon as you remember on the same day then continue your regularly scheduled once daily regimen the next day. Do not take two doses of Xarelto at the same time.   Important Safety Information A possible side effect of Xarelto is bleeding. You should call your healthcare provider right away if you experience any of the following:   Bleeding from an injury or your nose that does not stop.   Unusual colored urine (red or dark brown) or  unusual colored stools (red or black).   Unusual bruising for unknown reasons.   A serious fall or if you hit your head (even if there is no bleeding).  Some medicines may interact with Xarelto and might increase your risk of bleeding while on Xarelto. To help avoid this, consult your healthcare provider or pharmacist prior to using any new prescription or non-prescription medications, including herbals, vitamins, non-steroidal anti-inflammatory drugs (NSAIDs) and supplements.  This website has more information on Xarelto: [email or web address].

## 2013-09-20 NOTE — Progress Notes (Addendum)
PROGRESS NOTE  NORMAL RECINOS WUJ:[id number] DOB: 1946-04-30 DOA: 09/19/2013 PCP: Lynnea Ferrier TOM, MD  Assessment/Plan: Acute on chronic diastolic congestive heart failure. Patient presenting with clinical signs and symptoms consistent with acute decompensated congestive heart failure having of worsening dyspnea at rest, 6 pound weight gain, increasing bilateral extremity pitting edema. - tenuous renal function, diuresing as tolerated per cardiology.  Acute on chronic respiratory failure, likely secondary to combined congestive heart failure and COPD exacerbation.  COPD exacerbation - CXR with left base opacification, Azithro, repeat CXR after diuresis.  Atrial fibrillation. Will continue amiodarone 200 mg by mouth twice a day, metoprolol 25 mg daily and Cardizem 300 mg by mouth daily. Continue anticoagulation.  - DCCV per cards? Stage III chronic kidney disease. Patient's baseline creatinine near 2.3, 2.4. He presently appears to be at his baseline. We'll need to monitor closely given the need for IV diuresis Hypertension. Continue Cardizem and metoprolol therapy. Nutrition.   Diet: heart Fluids: none DVT Prophylaxis: Coumadin  Code Status: Full Family Communication: none  Disposition Plan: inpatient  Consultants:  Cardiology  Procedures:  none   Antibiotics - none  HPI/Subjective: Feeling short of breath still with ambulation to the bath and back  Objective: Filed Vitals:   09/19/13 2345 09/20/13 0113 09/20/13 0532 09/20/13 0821  BP:  117/78 119/86   Pulse:  105 108   Temp:  98.2 F (36.8 C) 98.3 F (36.8 C)   TempSrc:  Oral Oral   Resp:  18 18   Height:      Weight:   70.126 kg (154 lb 9.6 oz)   SpO2: 96% 98% 99% 94%    Intake/Output Summary (Last 24 hours) at 09/20/13 0859 Last data filed at 09/20/13 0809  Gross per 24 hour  Intake    723 ml  Output   1175 ml  Net   -452 ml   Filed Weights   09/19/13 0900 09/19/13 1038 09/20/13 0532  Weight:  74.39 kg (164 lb) 70.308 kg (155 lb) 70.126 kg (154 lb 9.6 oz)    Exam:   General:  NAD, mildly dyspneic sitting at the edge of the bed  Cardiovascular: irregular, tachycardic  Respiratory: no wheezing, bibasilar crackles.   Abdomen: soft, not tender to palpation, positive bowel sounds  MSK: 2+ peripheral edema  Neuro: non focal  Data Reviewed: Basic Metabolic Panel:  Recent Labs Lab 09/16/13 1041 09/19/13 0628 09/19/13 0634 09/20/13 0420  NA 134* 138 135* 136*  K 5.0 5.2 5.1 5.1  CL 96 95* 96 95*  CO2 33* 31  --  29  GLUCOSE 209* 162* 164* 187*  BUN 31* 31* 41* 44*  CREATININE 2.4* 2.29* 2.30* 2.75*  CALCIUM 8.5 9.0  --  8.7   Liver Function Tests:  Recent Labs Lab 09/19/13 0628  AST 15  ALT 23  ALKPHOS 88  BILITOT 0.3  PROT 6.9  ALBUMIN 3.0*   CBC:  Recent Labs Lab 09/16/13 1041 09/19/13 0628 09/19/13 0634 09/20/13 0420  WBC 9.1 9.3  --  10.3  NEUTROABS  --  7.7  --   --   HGB 11.7* 12.4* 13.6 9.9*  HCT 35.7* 37.9* 40.0 29.3*  MCV 96.6 97.9  --  94.8  PLT 209.0 130*  --  129*   Cardiac Enzymes:  Recent Labs Lab 09/19/13 1040 09/19/13 1545 09/19/13 2110  TROPONINI <0.30 <0.30 <0.30   BNP (last 3 results)  Recent Labs  09/08/13 1032 09/19/13 0628  PROBNP 5402.0* 6856.0*  Studies: Dg Chest Portable 1 View  09/19/2013   CLINICAL DATA:  Shortness of breath  EXAM: PORTABLE CHEST - 1 VIEW  COMPARISON:  09/10/2013  FINDINGS: Small bilateral pleural effusions, left more than right. The volume may have mildly increased from before. There is also increased lung density at the left base. Reticular opacities at the right base appears similar to prior. Normal heart size.  IMPRESSION: 1. Increased lung opacification at the left base, which could represent pneumonia or pneumonitis. 2. Small bilateral pleural effusions.   Electronically Signed   By: Tiburcio PeaJonathan  Watts M.D.   On: 09/19/2013 06:53   Scheduled Meds: . amiodarone  200 mg Oral BID  .  atorvastatin  40 mg Oral q1800  . diltiazem  300 mg Oral Daily  . [START ON 09/21/2013] furosemide  40 mg Intravenous Daily  . guaiFENesin  1,200 mg Oral BID  . ipratropium-albuterol  3 mL Nebulization QID  . methylPREDNISolone (SOLU-MEDROL) injection  40 mg Intravenous Q6H  . metoprolol succinate  50 mg Oral QPC breakfast  . mometasone-formoterol  2 puff Inhalation BID  . Rivaroxaban  15 mg Oral Q supper  . sodium chloride  3 mL Intravenous Q12H  . sodium chloride  3 mL Intravenous Q12H   Continuous Infusions:   Active Problems:   HTN (hypertension)   Atrial fibrillation   Long term (current) use of anticoagulants   Acute on chronic diastolic CHF (congestive heart failure)   COPD (chronic obstructive pulmonary disease)   Acute diastolic heart failure   Respiratory failure   Chronic kidney disease (CKD), stage III (moderate)  Time spent: 35  Pamella Pertostin Lilyonna Steidle, MD [facility] Pager [phone]. If 7 PM - 7 AM, please contact night-coverage at [email or web address], password [facility] Clif[facility] [email or web address] 09/20/2013, 8:59 AM  LOS: 1 day

## 2013-09-20 NOTE — Progress Notes (Signed)
Primary cardiologist: Dr. Cassell Clement  Subjective:    No major change in sense of palpitations. No chest pain. Some throat "scratchy feeling" after breathing treatment. No cough.  Objective:   Temp:  [97.4 F (36.3 C)-98.4 F (36.9 C)] 98.3 F (36.8 C) (01/04 0532) Pulse Rate:  [69-136] 108 (01/04 0532) Resp:  [15-20] 18 (01/04 0532) BP: (115-151)/(75-94) 119/86 mmHg (01/04 0532) SpO2:  [95 %-99 %] 99 % (01/04 0532) Weight:  [154 lb 9.6 oz (70.126 kg)-164 lb (74.39 kg)] 154 lb 9.6 oz (70.126 kg) (01/04 0532) Last BM Date: 09/19/13  Filed Weights   09/19/13 0900 09/19/13 1038 09/20/13 0532  Weight: 164 lb (74.39 kg) 155 lb (70.308 kg) 154 lb 9.6 oz (70.126 kg)    Intake/Output Summary (Last 24 hours) at 09/20/13 0757 Last data filed at 09/20/13 0710  Gross per 24 hour  Intake    483 ml  Output   1275 ml  Net   -792 ml    Telemetry: Atrial flutter with variable block.  Exam:  General: No distress.  Lungs: Decreased BS at bases.  Cardiac: Irregular, no gallop or rub.  Extremities:1-2+ edema.  Lab Results:  Basic Metabolic Panel:  Recent Labs Lab 09/16/13 1041 09/19/13 0628 09/19/13 0634 09/20/13 0420  NA 134* 138 135* 136*  K 5.0 5.2 5.1 5.1  CL 96 95* 96 95*  CO2 33* 31  --  29  GLUCOSE 209* 162* 164* 187*  BUN 31* 31* 41* 44*  CREATININE 2.4* 2.29* 2.30* 2.75*  CALCIUM 8.5 9.0  --  8.7    Liver Function Tests:  Recent Labs Lab 09/19/13 0628  AST 15  ALT 23  ALKPHOS 88  BILITOT 0.3  PROT 6.9  ALBUMIN 3.0*    CBC:  Recent Labs Lab 09/16/13 1041 09/19/13 0628 09/19/13 0634 09/20/13 0420  WBC 9.1 9.3  --  10.3  HGB 11.7* 12.4* 13.6 9.9*  HCT 35.7* 37.9* 40.0 29.3*  MCV 96.6 97.9  --  94.8  PLT 209.0 130*  --  129*    Cardiac Enzymes:  Recent Labs Lab 09/19/13 1040 09/19/13 1545 09/19/13 2110  TROPONINI <0.30 <0.30 <0.30    BNP:  Recent Labs  09/08/13 1032 09/19/13 0628  PROBNP 5402.0* 6856.0*     Coagulation:  Recent Labs Lab 09/19/13 0628  INR 1.71*     Medications:   Scheduled Medications: . amiodarone  200 mg Oral BID  . atorvastatin  40 mg Oral q1800  . diltiazem  300 mg Oral Daily  . furosemide  40 mg Intravenous BID  . guaiFENesin  1,200 mg Oral BID  . ipratropium-albuterol  3 mL Nebulization QID  . methylPREDNISolone (SOLU-MEDROL) injection  40 mg Intravenous Q6H  . metoprolol succinate  50 mg Oral QPC breakfast  . mometasone-formoterol  2 puff Inhalation BID  . Rivaroxaban  15 mg Oral Q supper  . sodium chloride  3 mL Intravenous Q12H  . sodium chloride  3 mL Intravenous Q12H      PRN Medications:  sodium chloride, acetaminophen, acetaminophen, alum & mag hydroxide-simeth, ipratropium-albuterol, morphine injection, ondansetron (ZOFRAN) IV, ondansetron, oxyCODONE, sodium chloride   Assessment:    1. Acute on chronic diastolic heart failure, not on outpatient diuretic since recent hospital discharge in the face of recent acute on chronic renal failure. approximally 10 pound weight gain. Also complicated by persistent atrial fibrillation/flutter. Approximately 800 out more than in.  2. Persistent atrial fibrillation/flutter. Currently on Xarelto, amiodarone, Cardizem CD, and  metoprolol. Presumably pending discussion about elective cardioversion. He was to see Dr. Patty SermonsBrackbill this week on Tuesday.  3. CKD, stage 4. Creatinine 2.3 up to 2.7 with Lasix.  4. Hyperlipidemia, on statin therapy.   5. COPD. Also question left base PNA versus pneumonitis with effusions.   Plan/Discussion:    Will cut back but try and continue Lasix. Likely would benefit from restoring sinus rhythm sooner rather than later as this may also help in management of diastolic heart failure. Dr. Patty SermonsBrackbill to see - consider TEE/DCCV. Will continue Xarelto, amiodarone, Cardizem CD, and metoprolol. Follow-up BMET in AM.   Jonelle SidleSamuel G. McDowell, M.D., F.A.C.C.

## 2013-09-21 ENCOUNTER — Ambulatory Visit: Payer: Medicare Other | Admitting: Family Medicine

## 2013-09-21 DIAGNOSIS — I5031 Acute diastolic (congestive) heart failure: Secondary | ICD-10-CM

## 2013-09-21 LAB — BASIC METABOLIC PANEL
BUN: 52 mg/dL — ABNORMAL HIGH (ref 6–23)
CO2: 26 meq/L (ref 19–32)
Calcium: 8.9 mg/dL (ref 8.4–10.5)
Chloride: 94 mEq/L — ABNORMAL LOW (ref 96–112)
Creatinine, Ser: 2.78 mg/dL — ABNORMAL HIGH (ref 0.50–1.35)
GFR calc non Af Amer: 22 mL/min — ABNORMAL LOW (ref >90)
GFR, EST AFRICAN AMERICAN: 25 mL/min — AB (ref >90)
Glucose, Bld: 183 mg/dL — ABNORMAL HIGH (ref 70–99)
POTASSIUM: 5.2 meq/L (ref 3.7–5.3)
SODIUM: 138 meq/L (ref 137–147)

## 2013-09-21 MED ORDER — FUROSEMIDE 10 MG/ML IJ SOLN
40.0000 mg | Freq: Two times a day (BID) | INTRAMUSCULAR | Status: DC
  Administered 2013-09-21: 40 mg via INTRAVENOUS
  Filled 2013-09-21 (×3): qty 4

## 2013-09-21 NOTE — Progress Notes (Signed)
UR completed Django Nguyen K. Crystin Lechtenberg, RN, BSN, MSHL, CCM  09/21/2013 4:59 PM

## 2013-09-21 NOTE — Progress Notes (Signed)
Subjective: +orthopnea, LEE.  Weight went up since yesterday.   Objective: Vital signs in last 24 hours: Temp:  [96.9 F (36.1 C)-98.4 F (36.9 C)] 97.5 F (36.4 C) (01/05 0442) Pulse Rate:  [95-109] 97 (01/05 0900) Resp:  [18-20] 18 (01/05 0900) BP: (119-139)/(70-86) 139/80 mmHg (01/05 0900) SpO2:  [93 %-100 %] 97 % (01/05 0900) Weight:  [156 lb 4.9 oz (70.9 kg)] 156 lb 4.9 oz (70.9 kg) (01/05 0442) Last BM Date: 09/20/13  Intake/Output from previous day: 01/04 0701 - 01/05 0700 In: 720 [P.O.:720] Out: 1125 [Urine:1125] Intake/Output this shift: Total I/O In: 240 [P.O.:240] Out: 300 [Urine:300]  Medications Current Facility-Administered Medications  Medication Dose Route Frequency Provider Last Rate Last Dose  . 0.9 %  sodium chloride infusion  250 mL Intravenous PRN Jeralyn Bennett, MD      . acetaminophen (TYLENOL) tablet 650 mg  650 mg Oral Q6H PRN Jeralyn Bennett, MD       Or  . acetaminophen (TYLENOL) suppository 650 mg  650 mg Rectal Q6H PRN Jeralyn Bennett, MD      . alum & mag hydroxide-simeth (MAALOX/MYLANTA) 200-200-20 MG/5ML suspension 30 mL  30 mL Oral Q6H PRN Jeralyn Bennett, MD      . amiodarone (PACERONE) tablet 200 mg  200 mg Oral BID Jeralyn Bennett, MD   200 mg at 09/20/13 2309  . atorvastatin (LIPITOR) tablet 40 mg  40 mg Oral q1800 Jeralyn Bennett, MD   40 mg at 09/20/13 1909  . azithromycin [facility]) tablet 250 mg  250 mg Oral Daily Costin Otelia Sergeant, MD      . diltiazem (CARDIZEM CD) 24 hr capsule 300 mg  300 mg Oral Daily Jeralyn Bennett, MD   300 mg at 09/20/13 1049  . furosemide (LASIX) injection 40 mg  40 mg Intravenous Daily Jonelle Sidle, MD      . guaiFENesin [facility]) 12 hr tablet 1,200 mg  1,200 mg Oral BID Jeralyn Bennett, MD   1,200 mg at 09/20/13 2309  . ipratropium-albuterol (DUONEB) 0.5-2.5 (3) MG/3ML nebulizer solution 3 mL  3 mL Nebulization QID Jeralyn Bennett, MD   3 mL at 09/21/13 0758  . ipratropium-albuterol (DUONEB)  0.5-2.5 (3) MG/3ML nebulizer solution 3 mL  3 mL Nebulization Q2H PRN Jeralyn Bennett, MD      . methylPREDNISolone sodium succinate (SOLU-MEDROL) 40 mg/mL injection 40 mg  40 mg Intravenous Q6H Jeralyn Bennett, MD   40 mg at 09/21/13 0645  . metoprolol succinate (TOPROL-XL) 24 hr tablet 50 mg  50 mg Oral QPC breakfast Jeralyn Bennett, MD   50 mg at 09/20/13 1049  . mometasone-formoterol (DULERA) 100-5 MCG/ACT inhaler 2 puff  2 puff Inhalation BID Jeralyn Bennett, MD   2 puff at 09/21/13 0758  . morphine 2 MG/ML injection 2 mg  2 mg Intravenous Q4H PRN Jeralyn Bennett, MD      . ondansetron (ZOFRAN) tablet 4 mg  4 mg Oral Q6H PRN Jeralyn Bennett, MD       Or  . ondansetron (ZOFRAN) injection 4 mg  4 mg Intravenous Q6H PRN Jeralyn Bennett, MD      . oxyCODONE (Oxy IR/ROXICODONE) immediate release tablet 5 mg  5 mg Oral Q4H PRN Jeralyn Bennett, MD      . Rivaroxaban (XARELTO) tablet 15 mg  15 mg Oral Q supper Jeralyn Bennett, MD   15 mg at 09/20/13 1909  . sodium chloride 0.9 % injection 3 mL  3 mL Intravenous Q12H Jeralyn Bennett, MD  3 mL at 09/19/13 1310  . sodium chloride 0.9 % injection 3 mL  3 mL Intravenous Q12H Jeralyn BennettEzequiel Zamora, MD   3 mL at 09/20/13 2310  . sodium chloride 0.9 % injection 3 mL  3 mL Intravenous PRN Jeralyn BennettEzequiel Zamora, MD        PE: General appearance: alert, cooperative and no distress Neck: +JVD Lungs: Decreased BS.  Bilateral  rales.  left wheeze. Heart: irregularly irregular rhythm Extremities: 3+ LEE Pulses: 2+ and symmetric Skin: Warm and dry Neurologic: Grossly normal  Lab Results:   Recent Labs  09/19/13 0628 09/19/13 0634 09/20/13 0420  WBC 9.3  --  10.3  HGB 12.4* 13.6 9.9*  HCT 37.9* 40.0 29.3*  PLT 130*  --  129*   BMET  Recent Labs  09/19/13 0628 09/19/13 0634 09/20/13 0420 09/21/13 0636  NA 138 135* 136* 138  K 5.2 5.1 5.1 5.2  CL 95* 96 95* 94*  CO2 31  --  29 26  GLUCOSE 162* 164* 187* 183*  BUN 31* 41* 44* 52*  CREATININE 2.29*  2.30* 2.75* 2.78*  CALCIUM 9.0  --  8.7 8.9   PT/INR  Recent Labs  09/19/13 0628  LABPROT 19.6*  INR 1.71*    Assessment/Plan  Active Problems:   HTN (hypertension)   Atrial fibrillation   Long term (current) use of anticoagulants   Acute on chronic diastolic CHF (congestive heart failure)   COPD (chronic obstructive pulmonary disease)   Acute diastolic heart failure   Respiratory failure   Chronic kidney disease (CKD), stage III (moderate)  Plan: Maintaining afib at 108.  Xarelto.  Net fluids:  -0.4/-0.8L.  SCR increased 2.3>>2.75>>2.78.    Normal EF November 27th.  He is grossly volume overloaded secondary to Afib and diastolic dysfunction.  Needs increased lasix.  Currently ordered for Daily. Will change to BID.  Once the acute CHF has improved, consider DCCV.     LOS: 2 days    HAGER, BRYAN 09/21/2013 9:50 AM  As above, patient seen and examined. He remains dyspneic. He is also volume overloaded on examination. Previous echocardiogram showed normal LV function. His symptoms began when he developed atrial fibrillation in November. Plan to continue amiodarone, Cardizem and metoprolol. Continue xeralto. Proceed with TEE guided cardioversion tomorrow. Continue Lasix and follow renal function. His CHF and renal function will hopefully improve with reestablishing sinus rhythm. Olga MillersBrian Torion Hulgan 12:05 PM

## 2013-09-21 NOTE — Progress Notes (Signed)
PROGRESS NOTE  Mark [name] ZOX:[id number] DOB: 1946/03/02 DOA: 09/19/2013 PCP: Lynnea Ferrier TOM, MD  Assessment/Plan: Acute on chronic diastolic congestive heart failure. Patient presenting with clinical signs and symptoms consistent with acute decompensated congestive heart failure having of worsening dyspnea at rest, 6 pound weight gain, increasing bilateral extremity pitting edema. - tenuous renal function, diuresing as tolerated per cardiology.  - plan for TEE and CV tomorrow.  Acute on chronic respiratory failure, likely secondary to combined congestive heart failure and COPD exacerbation.  COPD exacerbation - CXR with left base opacification, Azithro, repeat CXR after diuresis.  Atrial fibrillation. Will continue amiodarone 200 mg by mouth twice a day, metoprolol 25 mg daily and Cardizem 300 mg by mouth daily. Continue anticoagulation.  - cardioversion tomorrow.  Stage III chronic kidney disease. Patient's baseline creatinine near 2.3, 2.4. He presently appears to be at his baseline. We'll need to monitor closely given the need for IV diuresis Hypertension. Continue Cardizem and metoprolol therapy. Nutrition.   Diet: heart Fluids: none DVT Prophylaxis: Coumadin  Code Status: Full Family Communication: none  Disposition Plan: inpatient  Consultants:  Cardiology  Procedures:  none   Antibiotics - none  HPI/Subjective: - still with dyspnea  Objective: Filed Vitals:   09/20/13 2301 09/21/13 0442 09/21/13 0800 09/21/13 0900  BP: 133/81 124/81  139/80  Pulse: 106 109  97  Temp: 98.2 F (36.8 C) 97.5 F (36.4 C)    TempSrc: Oral Oral    Resp: 20 20  18   Height:      Weight:  70.9 kg (156 lb 4.9 oz)    SpO2: 95%  93% 97%    Intake/Output Summary (Last 24 hours) at 09/21/13 1214 Last data filed at 09/21/13 1021  Gross per 24 hour  Intake    920 ml  Output   1000 ml  Net    -80 ml   Filed Weights   09/19/13 1038 09/20/13 0532 09/21/13 0442  Weight:  70.308 kg (155 lb) 70.126 kg (154 lb 9.6 oz) 70.9 kg (156 lb 4.9 oz)    Exam:   General:  NAD, mildly dyspneic sitting at the edge of the bed  Cardiovascular: irregular, tachycardic  Respiratory: no wheezing, bibasilar crackles.   Abdomen: soft, not tender to palpation, positive bowel sounds  MSK: 2+ peripheral edema  Neuro: non focal  Data Reviewed: Basic Metabolic Panel:  Recent Labs Lab 09/16/13 1041 09/19/13 0628 09/19/13 0634 09/20/13 0420 09/21/13 0636  NA 134* 138 135* 136* 138  K 5.0 5.2 5.1 5.1 5.2  CL 96 95* 96 95* 94*  CO2 33* 31  --  29 26  GLUCOSE 209* 162* 164* 187* 183*  BUN 31* 31* 41* 44* 52*  CREATININE 2.4* 2.29* 2.30* 2.75* 2.78*  CALCIUM 8.5 9.0  --  8.7 8.9   Liver Function Tests:  Recent Labs Lab 09/19/13 0628  AST 15  ALT 23  ALKPHOS 88  BILITOT 0.3  PROT 6.9  ALBUMIN 3.0*   CBC:  Recent Labs Lab 09/16/13 1041 09/19/13 0628 09/19/13 0634 09/20/13 0420  WBC 9.1 9.3  --  10.3  NEUTROABS  --  7.7  --   --   HGB 11.7* 12.4* 13.6 9.9*  HCT 35.7* 37.9* 40.0 29.3*  MCV 96.6 97.9  --  94.8  PLT 209.0 130*  --  129*   Cardiac Enzymes:  Recent Labs Lab 09/19/13 1040 09/19/13 1545 09/19/13 2110  TROPONINI <0.30 <0.30 <0.30   BNP (last 3 results)  Recent Labs  09/08/13 1032 09/19/13 0628  PROBNP 5402.0* 6856.0*   Studies: No results found. Scheduled Meds: . amiodarone  200 mg Oral BID  . atorvastatin  40 mg Oral q1800  . azithromycin  250 mg Oral Daily  . diltiazem  300 mg Oral Daily  . furosemide  40 mg Intravenous BID  . guaiFENesin  1,200 mg Oral BID  . ipratropium-albuterol  3 mL Nebulization QID  . methylPREDNISolone (SOLU-MEDROL) injection  40 mg Intravenous Q6H  . metoprolol succinate  50 mg Oral QPC breakfast  . mometasone-formoterol  2 puff Inhalation BID  . Rivaroxaban  15 mg Oral Q supper  . sodium chloride  3 mL Intravenous Q12H  . sodium chloride  3 mL Intravenous Q12H   Continuous Infusions:    Active Problems:   HTN (hypertension)   Atrial fibrillation   Long term (current) use of anticoagulants   Acute on chronic diastolic CHF (congestive heart failure)   COPD (chronic obstructive pulmonary disease)   Acute diastolic heart failure   Respiratory failure   Chronic kidney disease (CKD), stage III (moderate)  Time spent: 25  Mark Pertostin Barre Aydelott, MD [facility] Pager [phone]. If 7 PM - 7 AM, please contact night-coverage at [email or web address], password [email or web address] 09/21/2013, 12:14 PM  LOS: 2 days

## 2013-09-22 ENCOUNTER — Encounter (HOSPITAL_COMMUNITY): Payer: Self-pay | Admitting: Gastroenterology

## 2013-09-22 ENCOUNTER — Encounter (HOSPITAL_COMMUNITY): Payer: Medicare Other | Admitting: Certified Registered"

## 2013-09-22 ENCOUNTER — Ambulatory Visit: Payer: Medicare Other | Admitting: Physician Assistant

## 2013-09-22 ENCOUNTER — Inpatient Hospital Stay (HOSPITAL_COMMUNITY): Payer: Medicare Other | Admitting: Certified Registered"

## 2013-09-22 ENCOUNTER — Encounter (HOSPITAL_COMMUNITY)
Admission: EM | Disposition: A | Discharge status: Home Health | Payer: Medicare Other | Source: Home / Self Care | Attending: Family Medicine

## 2013-09-22 DIAGNOSIS — I4891 Unspecified atrial fibrillation: Secondary | ICD-10-CM

## 2013-09-22 DIAGNOSIS — I059 Rheumatic mitral valve disease, unspecified: Secondary | ICD-10-CM

## 2013-09-22 HISTORY — PX: TEE WITHOUT CARDIOVERSION: SHX5443

## 2013-09-22 HISTORY — PX: CARDIOVERSION: SHX1299

## 2013-09-22 LAB — BASIC METABOLIC PANEL
BUN: 62 mg/dL — ABNORMAL HIGH (ref 6–23)
BUN: 60 mg/dL — ABNORMAL HIGH (ref 6–23)
CALCIUM: 9.3 mg/dL (ref 8.4–10.5)
CHLORIDE: 93 meq/L — AB (ref 96–112)
CHLORIDE: 93 meq/L — AB (ref 96–112)
CO2: 32 meq/L (ref 19–32)
CO2: 34 meq/L — AB (ref 19–32)
CREATININE: 2.9 mg/dL — AB (ref 0.50–1.35)
Calcium: 8.7 mg/dL (ref 8.4–10.5)
Creatinine, Ser: 2.88 mg/dL — ABNORMAL HIGH (ref 0.50–1.35)
GFR calc Af Amer: 24 mL/min — ABNORMAL LOW (ref >90)
GFR calc non Af Amer: 21 mL/min — ABNORMAL LOW (ref >90)
GFR calc non Af Amer: 21 mL/min — ABNORMAL LOW (ref >90)
GFR, EST AFRICAN AMERICAN: 24 mL/min — AB (ref >90)
GLUCOSE: 258 mg/dL — AB (ref 70–99)
GLUCOSE: 402 mg/dL — AB (ref 70–99)
POTASSIUM: 5.7 meq/L — AB (ref 3.7–5.3)
POTASSIUM: 5.3 meq/L (ref 3.7–5.3)
Sodium: 139 mEq/L (ref 137–147)
Sodium: 141 mEq/L (ref 137–147)

## 2013-09-22 LAB — CBC
HEMATOCRIT: 31.7 % — AB (ref 39.0–52.0)
HEMOGLOBIN: 10.3 g/dL — AB (ref 13.0–17.0)
MCH: 31.4 pg (ref 26.0–34.0)
MCHC: 32.5 g/dL (ref 30.0–36.0)
MCV: 96.6 fL (ref 78.0–100.0)
PLATELETS: 175 10*3/uL (ref 150–400)
RBC: 3.28 MIL/uL — AB (ref 4.22–5.81)
RDW: 14.3 % (ref 11.5–15.5)
WBC: 13.6 10*3/uL — AB (ref 4.0–10.5)

## 2013-09-22 SURGERY — ECHOCARDIOGRAM, TRANSESOPHAGEAL
Anesthesia: Moderate Sedation

## 2013-09-22 MED ORDER — SODIUM CHLORIDE 0.9 % IV SOLN
INTRAVENOUS | Status: DC | PRN
  Administered 2013-09-22: 11:00:00 via INTRAVENOUS

## 2013-09-22 MED ORDER — ALUM & MAG HYDROXIDE-SIMETH 200-200-20 MG/5ML PO SUSP: 30.0000 mL | Freq: Four times a day (QID) | ORAL | Status: DC | PRN

## 2013-09-22 MED ORDER — FUROSEMIDE 10 MG/ML IJ SOLN
40.0000 mg | Freq: Two times a day (BID) | INTRAMUSCULAR | Status: DC
  Administered 2013-09-22: 17:00:00 40 mg via INTRAVENOUS

## 2013-09-22 MED ORDER — SODIUM POLYSTYRENE SULFONATE 15 GM/60ML PO SUSP
30.0000 g | Freq: Once | ORAL | Status: DC
  Filled 2013-09-22: qty 120

## 2013-09-22 MED ORDER — ACETAMINOPHEN 325 MG PO TABS: 650.0000 mg | ORAL_TABLET | Freq: Four times a day (QID) | ORAL | Status: DC | PRN

## 2013-09-22 MED ORDER — ATORVASTATIN CALCIUM 40 MG PO TABS
40.0000 mg | ORAL_TABLET | Freq: Every day | ORAL | Status: DC
  Administered 2013-09-22 – 2013-09-28 (×7): 40 mg via ORAL
  Filled 2013-09-22 (×9): qty 1

## 2013-09-22 MED ORDER — IPRATROPIUM-ALBUTEROL 0.5-2.5 (3) MG/3ML IN SOLN: 3.0000 mL | RESPIRATORY_TRACT | Status: DC | PRN

## 2013-09-22 MED ORDER — GUAIFENESIN ER 600 MG PO TB12
1200.0000 mg | ORAL_TABLET | Freq: Two times a day (BID) | ORAL | Status: DC
  Administered 2013-09-22 – 2013-09-29 (×14): 1200 mg via ORAL
  Filled 2013-09-22 (×16): qty 2

## 2013-09-22 MED ORDER — FENTANYL CITRATE 0.05 MG/ML IJ SOLN
INTRAMUSCULAR | Status: DC | PRN
  Administered 2013-09-22: 25 ug via INTRAVENOUS

## 2013-09-22 MED ORDER — METHYLPREDNISOLONE SODIUM SUCC 40 MG IJ SOLR
40.0000 mg | Freq: Four times a day (QID) | INTRAMUSCULAR | Status: DC
  Administered 2013-09-22 – 2013-09-23 (×4): 40 mg via INTRAVENOUS
  Filled 2013-09-22 (×8): qty 1

## 2013-09-22 MED ORDER — AZITHROMYCIN 250 MG PO TABS
250.0000 mg | ORAL_TABLET | Freq: Every day | ORAL | Status: DC
  Filled 2013-09-22: qty 1

## 2013-09-22 MED ORDER — DIPHENHYDRAMINE HCL 50 MG/ML IJ SOLN
INTRAMUSCULAR | Status: AC
  Filled 2013-09-22: qty 1

## 2013-09-22 MED ORDER — OXYCODONE HCL 5 MG PO TABS: 5.0000 mg | ORAL_TABLET | ORAL | Status: DC | PRN

## 2013-09-22 MED ORDER — IPRATROPIUM-ALBUTEROL 0.5-2.5 (3) MG/3ML IN SOLN
3.0000 mL | Freq: Four times a day (QID) | RESPIRATORY_TRACT | Status: DC
  Administered 2013-09-22 – 2013-09-26 (×19): 3 mL via RESPIRATORY_TRACT
  Filled 2013-09-22 (×46): qty 3

## 2013-09-22 MED ORDER — MIDAZOLAM HCL 10 MG/2ML IJ SOLN
INTRAMUSCULAR | Status: DC | PRN
  Administered 2013-09-22: 2 mg via INTRAVENOUS
  Administered 2013-09-22: 1 mg via INTRAVENOUS

## 2013-09-22 MED ORDER — SODIUM CHLORIDE 0.9 % IJ SOLN: 3.0000 mL | INTRAMUSCULAR | Status: DC | PRN

## 2013-09-22 MED ORDER — MOMETASONE FURO-FORMOTEROL FUM 100-5 MCG/ACT IN AERO: 2.0000 | INHALATION_SPRAY | Freq: Two times a day (BID) | RESPIRATORY_TRACT | Status: DC

## 2013-09-22 MED ORDER — AZITHROMYCIN 250 MG PO TABS
250.0000 mg | ORAL_TABLET | Freq: Every day | ORAL | Status: DC
  Administered 2013-09-22 – 2013-09-24 (×3): 250 mg via ORAL
  Filled 2013-09-22 (×3): qty 1

## 2013-09-22 MED ORDER — PROPOFOL 10 MG/ML IV BOLUS
INTRAVENOUS | Status: DC | PRN
  Administered 2013-09-22: 50 mg via INTRAVENOUS

## 2013-09-22 MED ORDER — RIVAROXABAN 15 MG PO TABS
15.0000 mg | ORAL_TABLET | Freq: Every day | ORAL | Status: DC
  Administered 2013-09-22 – 2013-09-28 (×7): 15 mg via ORAL
  Filled 2013-09-22 (×9): qty 1

## 2013-09-22 MED ORDER — DILTIAZEM HCL ER COATED BEADS 300 MG PO CP24
300.0000 mg | ORAL_CAPSULE | Freq: Every day | ORAL | Status: DC
  Administered 2013-09-22 – 2013-09-29 (×8): 300 mg via ORAL
  Filled 2013-09-22 (×9): qty 1

## 2013-09-22 MED ORDER — FENTANYL CITRATE 0.05 MG/ML IJ SOLN
INTRAMUSCULAR | Status: AC
  Filled 2013-09-22: qty 2

## 2013-09-22 MED ORDER — ONDANSETRON HCL 4 MG PO TABS: 4.0000 mg | ORAL_TABLET | Freq: Four times a day (QID) | ORAL | Status: DC | PRN

## 2013-09-22 MED ORDER — AMIODARONE HCL 200 MG PO TABS
200.0000 mg | ORAL_TABLET | Freq: Two times a day (BID) | ORAL | Status: DC
  Administered 2013-09-22 (×2): 200 mg via ORAL
  Filled 2013-09-22 (×4): qty 1

## 2013-09-22 MED ORDER — MOMETASONE FURO-FORMOTEROL FUM 100-5 MCG/ACT IN AERO
2.0000 | INHALATION_SPRAY | Freq: Two times a day (BID) | RESPIRATORY_TRACT | Status: DC
  Administered 2013-09-22 – 2013-09-29 (×14): 2 via RESPIRATORY_TRACT
  Filled 2013-09-22: qty 8.8

## 2013-09-22 MED ORDER — SODIUM CHLORIDE 0.9 % IV SOLN
INTRAVENOUS | Status: DC
  Administered 2013-09-22: 10:00:00 500 mL via INTRAVENOUS
  Administered 2013-09-22: 05:00:00 via INTRAVENOUS

## 2013-09-22 MED ORDER — SODIUM POLYSTYRENE SULFONATE 15 GM/60ML PO SUSP
30.0000 g | Freq: Once | ORAL | Status: AC
  Administered 2013-09-22: 30 g via ORAL
  Filled 2013-09-22: qty 120

## 2013-09-22 MED ORDER — SODIUM CHLORIDE 0.9 % IJ SOLN
3.0000 mL | Freq: Two times a day (BID) | INTRAMUSCULAR | Status: DC
Start: 2013-09-22 — End: 2013-09-29
  Administered 2013-09-22 – 2013-09-28 (×13): 3 mL via INTRAVENOUS

## 2013-09-22 MED ORDER — MIDAZOLAM HCL 5 MG/ML IJ SOLN
INTRAMUSCULAR | Status: AC
  Filled 2013-09-22: qty 2

## 2013-09-22 MED ORDER — METOPROLOL SUCCINATE ER 50 MG PO TB24
50.0000 mg | ORAL_TABLET | Freq: Every day | ORAL | Status: DC
  Administered 2013-09-22 – 2013-09-24 (×3): 50 mg via ORAL
  Filled 2013-09-22 (×4): qty 1

## 2013-09-22 MED ORDER — METOPROLOL SUCCINATE ER 50 MG PO TB24
50.0000 mg | ORAL_TABLET | Freq: Every day | ORAL | Status: DC
  Filled 2013-09-22: qty 1

## 2013-09-22 MED ORDER — BUTAMBEN-TETRACAINE-BENZOCAINE 2-2-14 % EX AERO
INHALATION_SPRAY | CUTANEOUS | Status: DC | PRN
  Administered 2013-09-22: 2 via TOPICAL

## 2013-09-22 NOTE — Anesthesia Postprocedure Evaluation (Signed)
  Anesthesia Post-op Note  Patient: Mark [name]  Procedure(s) Performed: Procedure(s): TRANSESOPHAGEAL ECHOCARDIOGRAM (TEE) (N/A) CARDIOVERSION (N/A)  Patient Location: Endoscopy Unit  Anesthesia Type:General  Level of Consciousness: awake, alert , oriented and patient cooperative  Airway and Oxygen Therapy: Patient Spontanous Breathing and Patient connected to nasal cannula oxygen  Post-op Pain: none  Post-op Assessment: Post-op Vital signs reviewed, Patient's Cardiovascular Status Stable, Respiratory Function Stable, Patent Airway and No signs of Nausea or vomiting  Post-op Vital Signs: Reviewed  Complications: No apparent anesthesia complications

## 2013-09-22 NOTE — Transfer of Care (Signed)
Immediate Anesthesia Transfer of Care Note  Patient: Mark [name]  Procedure(s) Performed: Procedure(s): TRANSESOPHAGEAL ECHOCARDIOGRAM (TEE) (N/A) CARDIOVERSION (N/A)  Patient Location: Endoscopy Unit  Anesthesia Type:General  Level of Consciousness: awake, alert , oriented and patient cooperative  Airway & Oxygen Therapy: Patient Spontanous Breathing and Patient connected to nasal cannula oxygen  Post-op Assessment: Report given to PACU RN and Post -op Vital signs reviewed and stable  Post vital signs: Reviewed and stable  Complications: No apparent anesthesia complications

## 2013-09-22 NOTE — Plan of Care (Signed)
Problem: Phase I Progression Outcomes Goal: EF % per last Echo/documented,Core Reminder form on chart Outcome: Completed/Met Date Met:  09/22/13 55-60%

## 2013-09-22 NOTE — Progress Notes (Signed)
Inpatient Diabetes Program Recommendations  AACE/ADA: New Consensus Statement on Inpatient Glycemic Control (2013)  Target Ranges:  Prepandial:   less than 140 mg/dL      Peak postprandial:   less than 180 mg/dL (1-2 hours)      Critically ill patients:  140 - 180 mg/dL   Results for Hetty BlendMEDLEY, Brigham T (MRN [id number]) as of 09/22/2013 12:45  Ref. Range 09/22/2013 04:29  Glucose Latest Range: 70-99 mg/dL 409258 (H)   Consider monitoring CBGs during steroid therapy. Thank you  Piedad ClimesGina Shakiera Edelson BSN, RN,CDE Inpatient Diabetes Coordinator [phone] (team pager)

## 2013-09-22 NOTE — CV Procedure (Addendum)
     Transesophageal Echocardiogram Note  Mark [name] [id number] [birth year]  Procedure: Transesophageal Echocardiogram Indications: atrial fibrillation  Procedure Details Consent: Obtained Time Out: Verified patient identification, verified procedure, site/side was marked, verified correct patient position, special equipment/implants available, Radiology Safety Procedures followed,  medications/allergies/relevent history reviewed, required imaging and test results available.  Performed  Medications: Fentanyl: 25 mcg Versed: 3 mg  Left Ventrical:  Normal size, thickness and function  Mitral Valve: Normal, mild to moderate MR, no vegetation  Aortic Valve: Structurally normal, trace AI, no vegetation  Tricuspid Valve: Normal, mild TR, no vegetation  Pulmonic Valve: Poorly visualized, no PR  Left Atrium/ Left atrial appendage: left atrium is mildly dilated, no thrombus in LA or LAA or in RAA. LAA filling and emptying velocities decreased at 20 cm/second.  Atrial septum: Normal, no PFO by color Doppler.  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 Mild plague in the ascending aorta.  Complications: No apparent complications Patient did tolerate procedure well.  Cardioversion Note  Mark [name] [id number] [birth year]  Procedure: DC Cardioversion Indications: a-fib  Procedure Details Consent: Obtained Time Out: Verified patient identification, verified procedure, site/side was marked, verified correct patient position, special equipment/implants available, Radiology Safety Procedures followed,  medications/allergies/relevent history reviewed, required imaging and test results available.  Performed  The patient has been on adequate anticoagulation.  The patient received 50 mg IV Propofol by anesthesia physician for sedation.  Synchronous cardioversion was performed at 150 joules x  1.  The cardioversion was successful   Complications: No apparent complications Patient did tolerate procedure well.   Mark MassonNELSON, Mark [name], H, MD, [facility] CenterFACC 09/22/2013, 9:57 AM

## 2013-09-22 NOTE — Progress Notes (Addendum)
PROGRESS NOTE  Mark [name] ZOX:[id number] DOB: 08-07-1946 DOA: 09/19/2013 PCP: Leo Grosser, MD  HPI Mark [name] is a 68 y.o. male with a past medical history of COPD, stage III chronic kidney disease, atrial fibrillation, diastolic congestive heart failure who has had multiple hospitalizations in the past month. He was recently admitted on 09/08/2013 discharged on 09/12/2013 at which time he presented with acute on chronic diastolic congestive heart failure. The hospitalization was complicated by the development of acute on chronic renal failure secondary to diuresis as his creatinine peaked at 3.49 on 09/11/2013. For this reason his diuretics were held. Patient was not discharged on diuretic therapy. since discharge he has had progressive dyspnea at rest, cough associated with white sputum production, wheezing, worsening of bilateral lower extremity edema, 6 pound weight gain in 1 week. He denies chest pain, fevers chills, nausea or vomiting, abdominal pain, syncope, presyncope, bloody stools. initial workup in the emergency department showed a BNP of 6856. EKG showed atrial fibrillation. patient noted to be an respiratory distress and was administered supplemental oxygen via nonrebreather.   Assessment/Plan: Acute on chronic diastolic congestive heart failure. Patient presenting with clinical signs and symptoms consistent with acute decompensated congestive heart failure having of worsening dyspnea at rest, 6 pound weight gain, increasing bilateral extremity pitting edema. - tenuous renal function, diuresing as tolerated per cardiology.  - plan for TEE and DCCV today.  - upon returning from the TEE patient had ALL of the inpatient orders discontinued by the endo team. Per IT it was not a glitch.  - appreciate careful monitoring of current cardio medication list by cardiology consultant team to ensure accurate list in line with the current medical plans.  Acute on chronic respiratory  failure, likely secondary to combined congestive heart failure and COPD exacerbation.  COPD exacerbation - CXR with left base opacification, Azithro, repeat CXR after diuresis.  Atrial fibrillation. Will continue amiodarone 200 mg by mouth twice a day, metoprolol 25 mg daily and Cardizem 300 mg by mouth daily. Continue anticoagulation.  Stage III chronic kidney disease. Patient's baseline creatinine near 2.3, 2.4. He presently appears to be at his baseline. We'll need to monitor closely given the need for IV diuresis Hypertension. Continue Cardizem and metoprolol therapy. Nutrition.   Diet: NPO, heart healthy after cardioversion Fluids: none DVT Prophylaxis: Coumadin  Code Status: Full Family Communication: none  Disposition Plan: inpatient  Consultants:  Cardiology  Procedures:  none   Antibiotics - none  HPI/Subjective: - still with dyspnea  Objective: Filed Vitals:   09/22/13 1130 09/22/13 1200 09/22/13 1359 09/22/13 1400  BP: 139/81 149/92 149/81 134/75  Pulse:   79   Temp:   97.5 F (36.4 C)   TempSrc:      Resp:   18   Height:      Weight:      SpO2:   94%     Intake/Output Summary (Last 24 hours) at 09/22/13 1512 Last data filed at 09/22/13 1045  Gross per 24 hour  Intake    290 ml  Output   1900 ml  Net  -1610 ml   Filed Weights   09/20/13 0532 09/21/13 0442 09/22/13 0735  Weight: 70.126 kg (154 lb 9.6 oz) 70.9 kg (156 lb 4.9 oz) 71.169 kg (156 lb 14.4 oz)    Exam:   General:  NAD, mildly dyspneic sitting at the edge of the bed  Cardiovascular: irregular, tachycardic  Respiratory: no wheezing, bibasilar crackles.   Abdomen: soft,  not tender to palpation, positive bowel sounds  MSK: 2+ peripheral edema  Neuro: non focal  Data Reviewed: Basic Metabolic Panel:  Recent Labs Lab 09/16/13 1041 09/19/13 0628 09/19/13 0634 09/20/13 0420 09/21/13 0636 09/22/13 0429  NA 134* 138 135* 136* 138 139  K 5.0 5.2 5.1 5.1 5.2 5.7*  CL 96 95*  96 95* 94* 93*  CO2 33* 31  --  29 26 32  GLUCOSE 209* 162* 164* 187* 183* 258*  BUN 31* 31* 41* 44* 52* 62*  CREATININE 2.4* 2.29* 2.30* 2.75* 2.78* 2.88*  CALCIUM 8.5 9.0  --  8.7 8.9 9.3   Liver Function Tests:  Recent Labs Lab 09/19/13 0628  AST 15  ALT 23  ALKPHOS 88  BILITOT 0.3  PROT 6.9  ALBUMIN 3.0*   CBC:  Recent Labs Lab 09/16/13 1041 09/19/13 0628 09/19/13 0634 09/20/13 0420 09/22/13 0429  WBC 9.1 9.3  --  10.3 13.6*  NEUTROABS  --  7.7  --   --   --   HGB 11.7* 12.4* 13.6 9.9* 10.3*  HCT 35.7* 37.9* 40.0 29.3* 31.7*  MCV 96.6 97.9  --  94.8 96.6  PLT 209.0 130*  --  129* 175   Cardiac Enzymes:  Recent Labs Lab 09/19/13 1040 09/19/13 1545 09/19/13 2110  TROPONINI <0.30 <0.30 <0.30   BNP (last 3 results)  Recent Labs  09/08/13 1032 09/19/13 0628  PROBNP 5402.0* 6856.0*   Studies: No results found. Scheduled Meds: . amiodarone  200 mg Oral BID  . atorvastatin  40 mg Oral q1800  . azithromycin  250 mg Oral Daily  . diltiazem  300 mg Oral Daily  . furosemide  40 mg Intravenous BID  . guaiFENesin  1,200 mg Oral BID  . ipratropium-albuterol  3 mL Nebulization QID  . methylPREDNISolone (SOLU-MEDROL) injection  40 mg Intravenous Q6H  . [START ON 09/23/2013] metoprolol succinate  50 mg Oral QPC breakfast  . mometasone-formoterol  2 puff Inhalation BID  . Rivaroxaban  15 mg Oral Q supper  . sodium chloride  3 mL Intravenous Q12H  . sodium polystyrene  30 g Oral Once   Continuous Infusions:   Active Problems:   HTN (hypertension)   Atrial fibrillation   Long term (current) use of anticoagulants   Acute on chronic diastolic CHF (congestive heart failure)   COPD (chronic obstructive pulmonary disease)   Acute diastolic heart failure   Respiratory failure   Chronic kidney disease (CKD), stage III (moderate)  Time spent: 25  Pamella Pertostin Hunter Pinkard, MD [facility] Pager [phone]. If 7 PM - 7 AM, please contact night-coverage at  [email or web address], password [email or web address] 09/22/2013, 3:12 PM  LOS: 3 days

## 2013-09-22 NOTE — Progress Notes (Signed)
Orders d/c upon release spoke with endo, IT and MD. IT York SpanielSaid endo MD most have connect order to d/c order upon transfer with order set. RN manual reorder under order management cosign req. Spoke with MD Elvera LennoxGherghe to verify orders.  Will hold meds until orders verified

## 2013-09-22 NOTE — Interval H&P Note (Signed)
History and Physical Interval Note:  09/22/2013 9:55 AM  Mark [name]  has presented today for surgery, with the diagnosis of  A FIB   The various methods of treatment have been discussed with the patient and family. After consideration of risks, benefits and other options for treatment, the patient has consented to  Procedure(s): TRANSESOPHAGEAL ECHOCARDIOGRAM (TEE) (N/A) CARDIOVERSION (N/A) as a surgical intervention .  The patient's history has been reviewed, patient examined, no change in status, stable for surgery.  I have reviewed the patient's chart and labs.  Questions were answered to the patient's satisfaction.     Tobias AlexanderNELSON, Sayward Horvath, H

## 2013-09-22 NOTE — Progress Notes (Signed)
Abx Dosing  Starting on azithromycin for  COPD exacerbation. Since this drug doesn't really need renal adjustments, will dose then sigh off.   Azith 250mg  qday  Ulyses SouthwardMinh Pham, PharmD Pager: [phone] 09/22/2013 1:57 PM

## 2013-09-22 NOTE — Progress Notes (Addendum)
Subjective: Patient denies dyspnea or chest pain; edema mildly improved.  Objective: Vital signs in last 24 hours: Temp:  [97.5 F (36.4 C)-97.7 F (36.5 C)] 97.7 F (36.5 C) (01/06 0735) Pulse Rate:  [84-98] 96 (01/06 0735) Resp:  [17-18] 17 (01/06 0735) BP: (115-145)/(85-89) 145/89 mmHg (01/06 0735) SpO2:  [93 %-94 %] 94 % (01/06 0735) Weight:  [156 lb 14.4 oz (71.169 kg)] 156 lb 14.4 oz (71.169 kg) (01/06 0735) Last BM Date: 09/20/13  Intake/Output from previous day: 01/05 0701 - 01/06 0700 In: 920 [P.O.:920] Out: 1800 [Urine:1800] Intake/Output this shift: Total I/O In: -  Out: 400 [Urine:400]  Medications Current Facility-Administered Medications  Medication Dose Route Frequency Provider Last Rate Last Dose  . 0.9 %  sodium chloride infusion  250 mL Intravenous PRN Jeralyn Bennett, MD      . 0.9 %  sodium chloride infusion   Intravenous Continuous Lewayne Bunting, MD      . acetaminophen (TYLENOL) tablet 650 mg  650 mg Oral Q6H PRN Jeralyn Bennett, MD       Or  . acetaminophen (TYLENOL) suppository 650 mg  650 mg Rectal Q6H PRN Jeralyn Bennett, MD      . alum & mag hydroxide-simeth (MAALOX/MYLANTA) 200-200-20 MG/5ML suspension 30 mL  30 mL Oral Q6H PRN Jeralyn Bennett, MD      . amiodarone (PACERONE) tablet 200 mg  200 mg Oral BID Jeralyn Bennett, MD   200 mg at 09/21/13 2101  . atorvastatin (LIPITOR) tablet 40 mg  40 mg Oral q1800 Jeralyn Bennett, MD   40 mg at 09/21/13 1734  . azithromycin (ZITHROMAX) tablet 250 mg  250 mg Oral Daily Costin Otelia Sergeant, MD   250 mg at 09/21/13 1006  . diltiazem (CARDIZEM CD) 24 hr capsule 300 mg  300 mg Oral Daily Jeralyn Bennett, MD   300 mg at 09/21/13 1003  . furosemide (LASIX) injection 40 mg  40 mg Intravenous BID Wilburt Finlay, PA-C   40 mg at 09/21/13 1739  . guaiFENesin (MUCINEX) 12 hr tablet 1,200 mg  1,200 mg Oral BID Jeralyn Bennett, MD   1,200 mg at 09/21/13 2101  . ipratropium-albuterol (DUONEB) 0.5-2.5 (3) MG/3ML  nebulizer solution 3 mL  3 mL Nebulization QID Jeralyn Bennett, MD   3 mL at 09/22/13 0811  . ipratropium-albuterol (DUONEB) 0.5-2.5 (3) MG/3ML nebulizer solution 3 mL  3 mL Nebulization Q2H PRN Jeralyn Bennett, MD      . methylPREDNISolone sodium succinate (SOLU-MEDROL) 40 mg/mL injection 40 mg  40 mg Intravenous Q6H Jeralyn Bennett, MD   40 mg at 09/22/13 0511  . metoprolol succinate (TOPROL-XL) 24 hr tablet 50 mg  50 mg Oral QPC breakfast Jeralyn Bennett, MD   50 mg at 09/21/13 1003  . mometasone-formoterol (DULERA) 100-5 MCG/ACT inhaler 2 puff  2 puff Inhalation BID Jeralyn Bennett, MD   2 puff at 09/22/13 [phone]  . morphine 2 MG/ML injection 2 mg  2 mg Intravenous Q4H PRN Jeralyn Bennett, MD      . ondansetron (ZOFRAN) tablet 4 mg  4 mg Oral Q6H PRN Jeralyn Bennett, MD       Or  . ondansetron (ZOFRAN) injection 4 mg  4 mg Intravenous Q6H PRN Jeralyn Bennett, MD      . oxyCODONE (Oxy IR/ROXICODONE) immediate release tablet 5 mg  5 mg Oral Q4H PRN Jeralyn Bennett, MD      . Rivaroxaban (XARELTO) tablet 15 mg  15 mg Oral Q supper Jeralyn Bennett,  MD   15 mg at 09/21/13 1734  . sodium chloride 0.9 % injection 3 mL  3 mL Intravenous Q12H Jeralyn BennettEzequiel Zamora, MD   3 mL at 09/21/13 1007  . sodium chloride 0.9 % injection 3 mL  3 mL Intravenous Q12H Jeralyn BennettEzequiel Zamora, MD   3 mL at 09/21/13 2101  . sodium chloride 0.9 % injection 3 mL  3 mL Intravenous PRN Jeralyn BennettEzequiel Zamora, MD        PE: WD, WN, NAD HEENT: normal Neck: supple  Chest: decreased BS bases CV irregular Abd: distended Ext: 2+ edema  Lab Results:   Recent Labs  09/20/13 0420 09/22/13 0429  WBC 10.3 13.6*  HGB 9.9* 10.3*  HCT 29.3* 31.7*  PLT 129* 175   BMET  Recent Labs  09/20/13 0420 09/21/13 0636 09/22/13 0429  NA 136* 138 139  K 5.1 5.2 5.7*  CL 95* 94* 93*  CO2 29 26 32  GLUCOSE 187* 183* 258*  BUN 44* 52* 62*  CREATININE 2.75* 2.78* 2.88*  CALCIUM 8.7 8.9 9.3    Assessment/Plan  Active Problems:   HTN  (hypertension)   Atrial fibrillation   Long term (current) use of anticoagulants   Acute on chronic diastolic CHF (congestive heart failure)   COPD (chronic obstructive pulmonary disease)   Acute diastolic heart failure   Respiratory failure   Chronic kidney disease (CKD), stage III (moderate)  Plan: He remains volume overloaded. Previous echocardiogram showed normal LV function. His symptoms began when he developed atrial fibrillation in November. Plan to continue amiodarone, Cardizem and metoprolol. Continue xeralto. Proceed with TEE guided cardioversion today. Hold lasix as renal function deteriorating. His CHF and renal function will hopefully improve with reestablishing sinus rhythm. Patient has hyperkalemia this AM; kayexalate 30 gms x 1. If renal function worsens following DCCV, will change xeralto to apixaban 5 BID. Olga MillersBrian Crenshaw 9:27 AM

## 2013-09-22 NOTE — Progress Notes (Signed)
RN educated Pt on diet. Pt had  Honey buns in room. meds verified by Med n given by RN Will continue to  Monitor

## 2013-09-22 NOTE — Progress Notes (Signed)
Pt rec. Back from endo. Pt in NSR . No complaints of pain or soB

## 2013-09-22 NOTE — Anesthesia Preprocedure Evaluation (Addendum)
Anesthesia Evaluation  Patient identified by MRN, date of birth, ID band Patient awake    Reviewed: Allergy & Precautions, H&P , NPO status , Patient's Chart, lab work & pertinent test results, reviewed documented beta blocker date and time   History of Anesthesia Complications Negative for: history of anesthetic complications  Airway Mallampati: II TM Distance: >3 FB Neck ROM: Full    Dental  (+) Edentulous Upper, Edentulous Lower and Dental Advisory Given   Pulmonary pneumonia - (November 2014), COPD (home O2 at night) COPD inhaler and oxygen dependent, former smoker (2ppdx30 yrs; quit 2 months ago),  breath sounds clear to auscultation        Cardiovascular hypertension, Pt. on medications and Pt. on home beta blockers + Peripheral Vascular Disease (s/p AAA repair) + dysrhythmias Atrial Fibrillation Rhythm:Irregular Rate:Normal  AAA repair 09/2012 TEE today: normal LVF    Neuro/Psych negative neurological ROS     GI/Hepatic Neg liver ROS, GERD-  Medicated and Controlled,  Endo/Other  negative endocrine ROSdiabetes (glu 258, pt reports not diabetic)  Renal/GU Renal InsufficiencyRenal disease (K+ 5.7, creat 2.88)     Musculoskeletal   Abdominal   Peds  Hematology  (+) Blood dyscrasia (Hb 10.3), anemia ,   Anesthesia Other Findings   Reproductive/Obstetrics negative OB ROS                          Anesthesia Physical Anesthesia Plan  ASA: III  Anesthesia Plan: General   Post-op Pain Management:    Induction:   Airway Management Planned: Natural Airway and Mask  Additional Equipment:   Intra-op Plan:   Post-operative Plan:   Informed Consent: I have reviewed the patients History and Physical, chart, labs and discussed the procedure including the risks, benefits and alternatives for the proposed anesthesia with the patient or authorized representative who has indicated his/her  understanding and acceptance.     Plan Discussed with: CRNA and Surgeon  Anesthesia Plan Comments: (Plan routine monitors, GA for cardioversion)        Anesthesia Quick Evaluation

## 2013-09-22 NOTE — Progress Notes (Signed)
  Echocardiogram Echocardiogram Transesophageal has been performed.  Jorje GuildCHUNG, Nikiesha Milford 09/22/2013, 10:37 AM

## 2013-09-22 NOTE — H&P (View-Only) (Signed)
Subjective: Patient denies dyspnea or chest pain; edema mildly improved.  Objective: Vital signs in last 24 hours: Temp:  [97.5 F (36.4 C)-97.7 F (36.5 C)] 97.7 F (36.5 C) (01/06 0735) Pulse Rate:  [84-98] 96 (01/06 0735) Resp:  [17-18] 17 (01/06 0735) BP: (115-145)/(85-89) 145/89 mmHg (01/06 0735) SpO2:  [93 %-94 %] 94 % (01/06 0735) Weight:  [156 lb 14.4 oz (71.169 kg)] 156 lb 14.4 oz (71.169 kg) (01/06 0735) Last BM Date: 09/20/13  Intake/Output from previous day: 01/05 0701 - 01/06 0700 In: 920 [P.O.:920] Out: 1800 [Urine:1800] Intake/Output this shift: Total I/O In: -  Out: 400 [Urine:400]  Medications Current Facility-Administered Medications  Medication Dose Route Frequency Provider Last Rate Last Dose  . 0.9 %  sodium chloride infusion  250 mL Intravenous PRN Jeralyn Bennett, MD      . 0.9 %  sodium chloride infusion   Intravenous Continuous Lewayne Bunting, MD      . acetaminophen (TYLENOL) tablet 650 mg  650 mg Oral Q6H PRN Jeralyn Bennett, MD       Or  . acetaminophen (TYLENOL) suppository 650 mg  650 mg Rectal Q6H PRN Jeralyn Bennett, MD      . alum & mag hydroxide-simeth (MAALOX/MYLANTA) 200-200-20 MG/5ML suspension 30 mL  30 mL Oral Q6H PRN Jeralyn Bennett, MD      . amiodarone (PACERONE) tablet 200 mg  200 mg Oral BID Jeralyn Bennett, MD   200 mg at 09/21/13 2101  . atorvastatin (LIPITOR) tablet 40 mg  40 mg Oral q1800 Jeralyn Bennett, MD   40 mg at 09/21/13 1734  . azithromycin (ZITHROMAX) tablet 250 mg  250 mg Oral Daily Costin Otelia Sergeant, MD   250 mg at 09/21/13 1006  . diltiazem (CARDIZEM CD) 24 hr capsule 300 mg  300 mg Oral Daily Jeralyn Bennett, MD   300 mg at 09/21/13 1003  . furosemide (LASIX) injection 40 mg  40 mg Intravenous BID Wilburt Finlay, PA-C   40 mg at 09/21/13 1739  . guaiFENesin (MUCINEX) 12 hr tablet 1,200 mg  1,200 mg Oral BID Jeralyn Bennett, MD   1,200 mg at 09/21/13 2101  . ipratropium-albuterol (DUONEB) 0.5-2.5 (3) MG/3ML  nebulizer solution 3 mL  3 mL Nebulization QID Jeralyn Bennett, MD   3 mL at 09/22/13 0811  . ipratropium-albuterol (DUONEB) 0.5-2.5 (3) MG/3ML nebulizer solution 3 mL  3 mL Nebulization Q2H PRN Jeralyn Bennett, MD      . methylPREDNISolone sodium succinate (SOLU-MEDROL) 40 mg/mL injection 40 mg  40 mg Intravenous Q6H Jeralyn Bennett, MD   40 mg at 09/22/13 0511  . metoprolol succinate (TOPROL-XL) 24 hr tablet 50 mg  50 mg Oral QPC breakfast Jeralyn Bennett, MD   50 mg at 09/21/13 1003  . mometasone-formoterol (DULERA) 100-5 MCG/ACT inhaler 2 puff  2 puff Inhalation BID Jeralyn Bennett, MD   2 puff at 09/22/13 [phone]  . morphine 2 MG/ML injection 2 mg  2 mg Intravenous Q4H PRN Jeralyn Bennett, MD      . ondansetron (ZOFRAN) tablet 4 mg  4 mg Oral Q6H PRN Jeralyn Bennett, MD       Or  . ondansetron (ZOFRAN) injection 4 mg  4 mg Intravenous Q6H PRN Jeralyn Bennett, MD      . oxyCODONE (Oxy IR/ROXICODONE) immediate release tablet 5 mg  5 mg Oral Q4H PRN Jeralyn Bennett, MD      . Rivaroxaban (XARELTO) tablet 15 mg  15 mg Oral Q supper Jeralyn Bennett,  MD   15 mg at 09/21/13 1734  . sodium chloride 0.9 % injection 3 mL  3 mL Intravenous Q12H Jeralyn BennettEzequiel Zamora, MD   3 mL at 09/21/13 1007  . sodium chloride 0.9 % injection 3 mL  3 mL Intravenous Q12H Jeralyn BennettEzequiel Zamora, MD   3 mL at 09/21/13 2101  . sodium chloride 0.9 % injection 3 mL  3 mL Intravenous PRN Jeralyn BennettEzequiel Zamora, MD        PE: WD, WN, NAD HEENT: normal Neck: supple  Chest: decreased BS bases CV irregular Abd: distended Ext: 2+ edema  Lab Results:   Recent Labs  09/20/13 0420 09/22/13 0429  WBC 10.3 13.6*  HGB 9.9* 10.3*  HCT 29.3* 31.7*  PLT 129* 175   BMET  Recent Labs  09/20/13 0420 09/21/13 0636 09/22/13 0429  NA 136* 138 139  K 5.1 5.2 5.7*  CL 95* 94* 93*  CO2 29 26 32  GLUCOSE 187* 183* 258*  BUN 44* 52* 62*  CREATININE 2.75* 2.78* 2.88*  CALCIUM 8.7 8.9 9.3    Assessment/Plan  Active Problems:   HTN  (hypertension)   Atrial fibrillation   Long term (current) use of anticoagulants   Acute on chronic diastolic CHF (congestive heart failure)   COPD (chronic obstructive pulmonary disease)   Acute diastolic heart failure   Respiratory failure   Chronic kidney disease (CKD), stage III (moderate)  Plan: He remains volume overloaded. Previous echocardiogram showed normal LV function. His symptoms began when he developed atrial fibrillation in November. Plan to continue amiodarone, Cardizem and metoprolol. Continue xeralto. Proceed with TEE guided cardioversion today. Hold lasix as renal function deteriorating. His CHF and renal function will hopefully improve with reestablishing sinus rhythm. Patient has hyperkalemia this AM; kayexalate 30 gms x 1. If renal function worsens following DCCV, will change xeralto to apixaban 5 BID. Olga MillersBrian Tempest Frankland 9:27 AM

## 2013-09-23 ENCOUNTER — Encounter (HOSPITAL_COMMUNITY): Payer: Self-pay | Admitting: Cardiology

## 2013-09-23 LAB — CBC
HEMATOCRIT: 31.8 % — AB (ref 39.0–52.0)
Hemoglobin: 10.4 g/dL — ABNORMAL LOW (ref 13.0–17.0)
MCH: 31.4 pg (ref 26.0–34.0)
MCHC: 32.7 g/dL (ref 30.0–36.0)
MCV: 96.1 fL (ref 78.0–100.0)
Platelets: 187 10*3/uL (ref 150–400)
RBC: 3.31 MIL/uL — AB (ref 4.22–5.81)
RDW: 14.3 % (ref 11.5–15.5)
WBC: 11.7 10*3/uL — ABNORMAL HIGH (ref 4.0–10.5)

## 2013-09-23 LAB — GLUCOSE, CAPILLARY
GLUCOSE-CAPILLARY: 219 mg/dL — AB (ref 70–99)
GLUCOSE-CAPILLARY: 249 mg/dL — AB (ref 70–99)
Glucose-Capillary: 321 mg/dL — ABNORMAL HIGH (ref 70–99)
Glucose-Capillary: 289 mg/dL — ABNORMAL HIGH (ref 70–99)

## 2013-09-23 LAB — BASIC METABOLIC PANEL
BUN: 59 mg/dL — ABNORMAL HIGH (ref 6–23)
CHLORIDE: 96 meq/L (ref 96–112)
CO2: 33 meq/L — AB (ref 19–32)
CREATININE: 2.7 mg/dL — AB (ref 0.50–1.35)
Calcium: 8.9 mg/dL (ref 8.4–10.5)
GFR calc non Af Amer: 22 mL/min — ABNORMAL LOW (ref >90)
GFR, EST AFRICAN AMERICAN: 26 mL/min — AB (ref >90)
Glucose, Bld: 306 mg/dL — ABNORMAL HIGH (ref 70–99)
Potassium: 4.4 mEq/L (ref 3.7–5.3)
Sodium: 142 mEq/L (ref 137–147)

## 2013-09-23 MED ORDER — INSULIN ASPART 100 UNIT/ML ~~LOC~~ SOLN
0.0000 [IU] | Freq: Three times a day (TID) | SUBCUTANEOUS | Status: DC
  Administered 2013-09-23: 3 [IU] via SUBCUTANEOUS
  Administered 2013-09-23: 5 [IU] via SUBCUTANEOUS
  Administered 2013-09-24: 3 [IU] via SUBCUTANEOUS
  Administered 2013-09-24: 5 [IU] via SUBCUTANEOUS

## 2013-09-23 MED ORDER — PREDNISONE 50 MG PO TABS
60.0000 mg | ORAL_TABLET | Freq: Every day | ORAL | Status: DC
  Administered 2013-09-23 – 2013-09-25 (×3): 60 mg via ORAL
  Filled 2013-09-23 (×4): qty 1

## 2013-09-23 MED ORDER — FUROSEMIDE 10 MG/ML IJ SOLN
40.0000 mg | Freq: Once | INTRAMUSCULAR | Status: AC
  Administered 2013-09-23: 40 mg via INTRAVENOUS

## 2013-09-23 MED ORDER — AMIODARONE HCL 200 MG PO TABS
200.0000 mg | ORAL_TABLET | Freq: Every day | ORAL | Status: DC
  Administered 2013-09-23 – 2013-09-25 (×3): 200 mg via ORAL
  Filled 2013-09-23 (×3): qty 1

## 2013-09-23 MED ORDER — INSULIN ASPART 100 UNIT/ML ~~LOC~~ SOLN
3.0000 [IU] | Freq: Three times a day (TID) | SUBCUTANEOUS | Status: DC
  Administered 2013-09-23 – 2013-09-24 (×4): 3 [IU] via SUBCUTANEOUS

## 2013-09-23 NOTE — Progress Notes (Signed)
Nutrition Education Note  RD consulted for nutrition education regarding a Heart Healthy diet.   Lipid Panel     Component Value Date/Time   CHOL 120 04/13/2013 0839   TRIG 118 04/13/2013 0839   HDL 35* 04/13/2013 0839   CHOLHDL 3.4 04/13/2013 0839   VLDL 24 04/13/2013 0839   LDLCALC 61 04/13/2013 0839    Per chart, pt educated on Low Sodium diet by RD during admission 12/424. Pt reports decreased use of salt and decreased intake of hot dogs, country ham, and biscuits and gravy since. Pt reports eating pastries on occasion. Discussed with pt that baked goods (including biscuits) have baking soda which is sodium bicarbonate; encouraged pt to continue to limit intake of pastries/baked goods. Pt reports eating fruits and vegetables daily and states he recently started eating more baked fish.  Encouraged pt to continue to limit sodium and fat in diet. Encouraged daily intake of fruits and vegetables. RD provided "5 Day sample Menus" handout from the Academy of [employer]etics as well as " Heart Healthy Shopping Tips" which pt stated he would share with his wife. Teach back method used.  Expect good compliance.  Body mass index is 24.95 kg/(m^2). Pt meets criteria for Normal Weight based on current BMI.  Current diet order is Carb Modified, patient is consuming approximately 100% of meals at this time. Labs and medications reviewed. No further nutrition interventions warranted at this time. RD contact information provided. If additional nutrition issues arise, please re-consult RD.  Ian Malkineanne Barnett RD, LDN Inpatient Clinical Dietitian Pager: [phone] After Hours Pager: [phone]

## 2013-09-23 NOTE — Progress Notes (Signed)
Patient explained benefits of [facility] [employer] [facility] and has accepted card [phone]#[id number]

## 2013-09-23 NOTE — Care Management Note (Addendum)
    Page 1 of 2   09/29/2013     2:30:36 PM   CARE MANAGEMENT NOTE 09/29/2013  Patient:  Mark Mark [name],Mark Mark [name]   Account Number:  [id number]  Date Initiated:  09/21/2013  Documentation initiated by:  HUTCHINSON,CRYSTAL  Subjective/Objective Assessment:   Admitted with CHF     Action/Plan:   CM will monitor for disposition needs.   Anticipated DC Date:  09/24/2013   Anticipated DC Plan:  HOME W HOME HEALTH SERVICES      DC Planning Services  CM consult      [facility]   Choice offered to / List presented to:  C-1 Patient           HH agency  [facility]   Status of service:  Completed, signed off Medicare Important Message given?   (If response is "NO", the following Medicare IM given date fields will be blank) Date Medicare IM given:   Date Additional Medicare IM given:    Discharge Disposition:  HOME W HOME HEALTH SERVICES  Per UR Regulation:  Reviewed for med. necessity/level of care/duration of stay  If discussed at Long Length of Stay Meetings, dates discussed:    Comments:  09/29/2013  (Refused HHS) Hx/o COPD, New DM, CHF Disposition:  Home /self care and active with Home O2. Patient is ambulating with no problems and denies need for HHS: RN/PT services.  States Mark Mark [name] lives next door and comes daily to assist as needed with meds and glucometer checks. MCR Glucometer order sheet completed by MD for member to obtain 1st Glucometer (New DM MGMT). CM encouraged to remain compliant and keep all MD f/u appts to avoid re-admission. Crystal Hutchinson RN, BSN, MSHL, CCM 09/29/2013  09/24/2012 hx/o Readmission:  Patient had PCP f/u appt scheduled for Monday but returned to ER on Sat prior to scheduled PH appt d/Mark [name] shortness of breath and difficulty breathing. Social:  lives in home with wife.  Supportive granddaughter/Mark Mark [name] lives next door and assist with all HC needs. Home DME:  Home Scales, glucometer, oxygen PCP,  Dr. Lynnea FerrierWarren Pickard [address] [place], [place] [address] Phone: [phone] Disposition Plan: PT recs:  _________pending CM Consult:  Set Pt up with [employer] transition of care case manager. [phone] CS # [phone] Mark Mark [name] # [id number] Plan:  [phone] Group  [id number] CM attempted to contact [employer] but unsuccessful with contact call. HHS:  RN - COPD, DM mgmt  Mark Mark [name]:  [facility]2 Donato Schultzrystal Hutchinson, RN, BSN, MSHL, CCM 09/24/2013  09/23/2013 Last UR date 09/21/2013 CM Consult:  Set Pt up with [employer] transition of care case manager. Pending. Donato Schultzrystal Hutchinson, RN, BSN, MSHL, CCM 09/23/2013   09/21/2013 Hx/o readmission:  Hx/o  recently admitted on 09/08/2013 discharged on 09/12/2013 at which time he presented with acute on chronic diastolic congestive heart failure. The hospitalization was complicated by the development of acute on chronic renal failure secondary to diuresis as his creatinine peaked at 3.49. Hx/o active wih [facility], Home O2 and COPD Gold. Disposition:  pending Donato Schultzrystal Hutchinson, RN, BSN, MSHL, CCM 09/21/2013

## 2013-09-23 NOTE — Progress Notes (Signed)
SATURATION QUALIFICATIONS: (This note is used to comply with regulatory documentation for home oxygen)  Patient Saturations on Room Air at Rest = 90%  Patient Saturations on Room Air while Ambulating = 80%  Patient Saturations on 2l Liters of oxygen while Ambulating = 90%  Please briefly explain why patient needs home oxygen:

## 2013-09-23 NOTE — Progress Notes (Signed)
Per NT: 90% at rest on RA ambulated 67500ft stat dropped 80% on RA recovered to 90% on 2L after 15 min.

## 2013-09-23 NOTE — Progress Notes (Signed)
Pt last 2 glucose levels per lab draw were 258 and 402. [name], NP on call notified and order to check cbg, result 321. New order for carb modified diet. Pt with no history of diabetes, on solu-medrol this admission. K Schorr to notify daytime team of elevated blood sugars. Will continue to monitor. Huel Coventryosenberger, Dejay Kronk A, RN

## 2013-09-23 NOTE — Progress Notes (Signed)
PROGRESS NOTE  Mark [name]:[id number] DOB: 09/09/1946 DOA: 09/19/2013 PCP: Mark Grosser, MD  HPI  68 y.o. ? known COPD [unclear stage], stage III chronic kidney disease, atrial fibrillation, diastolic congestive heart failure[Nuc stress 10/09/12=LVEF59%, Nl L wall motion] , AAA repair 10/15/12 c multiple hospitalizations recently. Admit on 09/08/2013 discharged on 09/12/2013 at which time he presented with acute on chronic diastolic congestive heart failure. creatinine peaked at 3.49 on 09/11/2013. ?  diuretics were held on d/c. Snce discharge he has had progressive dyspnea at rest, cough associated with white sputum production, wheezing, worsening of bilateral lower extremity edema, 6 pound weight gain in 1 week. He denies chest pain, fevers chills, nausea or vomiting, abdominal pain, syncope, presyncope, bloody stools. ED work-up= BNP of 6856. EKG showed atrial fibrillation. patient noted to be an respiratory distress and was administered supplemental oxygen via nonrebreather.   Assessment/Plan: Acute on chronic diastolic congestive heart failure. Patient presenting with clinical signs and symptoms consistent with acute decompensated congestive heart failure having of worsening dyspnea at rest, 6 pound weight gain, increasing bilateral extremity pitting edema. - Is now -1.9 L since admission.  Discharge weight last hospital stay 154, current weight 156 -  diuresing as tolerated per cardiology, Lasix IV 40 times one given this morning Acute on chronic respiratory failure, likely secondary to combined congestive heart failure and COPD exacerbation. Her heart COPD exacerbation - CXR with left base opacification, Azithro, repeat CXR after diuresis.  Atrial fibrillation, CHad2Vasc2 score=2-3. S/p direct cardioversion 150 J 09/22/13 Cont amiodarone 200 mg by mouth twice a day, metoprolol 50 mg daily and Cardizem 300 mg by mouth daily. Placed on Xarelto 15 mg daily due to renal fnxn. Mild  metabolic alkalosis-labs a.m. Stage III chronic kidney disease. Patient's baseline creatinine near 2.3, 2.4. He presently appears to be at his baseline. We'll need to monitor closely given the need for IV diuresis Mild-mod mitral regurgitation-monitor Hypertension. Continue Cardizem and metoprolol therapy. Nutrition.   Diet: NPO, heart healthy after cardioversion Fluids: none DVT Prophylaxis: Coumadin  Code Status: Full Family Communication: none  Disposition Plan: inpatient  Consultants:  Cardiology  Procedures:  none   Antibiotics - none  HPI/Subjective: Doing much better. O2 sats at Z. 93% off oxygen Still little bit overloaded Tolerating diet Wife at bedside  Objective: Filed Vitals:   09/23/13 0431 09/23/13 0833 09/23/13 1024 09/23/13 1140  BP: 148/80  152/95   Pulse: 79  80   Temp: 97.9 F (36.6 C)     TempSrc: Oral     Resp: 18  18   Height:      Weight: 71.169 kg (156 lb 14.4 oz)     SpO2: 95% 90% 95% 91%    Intake/Output Summary (Last 24 hours) at 09/23/13 1400 Last data filed at 09/23/13 1300  Gross per 24 hour  Intake   1200 ml  Output   1350 ml  Net   -150 ml   Filed Weights   09/21/13 0442 09/22/13 0735 09/23/13 0431  Weight: 70.9 kg (156 lb 4.9 oz) 71.169 kg (156 lb 14.4 oz) 71.169 kg (156 lb 14.4 oz)    Exam:   General:  NAD, mildly dyspneic sitting at the edge of the bed  Cardiovascular: Regular normal sinus rhythm  Respiratory: no wheezing, bibasilar crackles.   Abdomen: soft, not tender to palpation, positive bowel sounds  MSK: 2+ peripheral edema  Neuro: non focal  Data Reviewed: Basic Metabolic Panel:  Recent Labs Lab 09/20/13 0420 09/21/13  [id number] 09/22/13 0429 09/22/13 2025 09/23/13 0320  NA 136* 138 139 141 142  K 5.1 5.2 5.7* 5.3 4.4  CL 95* 94* 93* 93* 96  CO2 29 26 32 34* 33*  GLUCOSE 187* 183* 258* 402* 306*  BUN 44* 52* 62* 60* 59*  CREATININE 2.75* 2.78* 2.88* 2.90* 2.70*  CALCIUM 8.7 8.9 9.3 8.7 8.9    Liver Function Tests:  Recent Labs Lab 09/19/13 0628  AST 15  ALT 23  ALKPHOS 88  BILITOT 0.3  PROT 6.9  ALBUMIN 3.0*   CBC:  Recent Labs Lab 09/19/13 0628 09/19/13 0634 09/20/13 0420 09/22/13 0429 09/23/13 0320  WBC 9.3  --  10.3 13.6* 11.7*  NEUTROABS 7.7  --   --   --   --   HGB 12.4* 13.6 9.9* 10.3* 10.4*  HCT 37.9* 40.0 29.3* 31.7* 31.8*  MCV 97.9  --  94.8 96.6 96.1  PLT 130*  --  129* 175 187   Cardiac Enzymes:  Recent Labs Lab 09/19/13 1040 09/19/13 1545 09/19/13 2110  TROPONINI <0.30 <0.30 <0.30   BNP (last 3 results)  Recent Labs  09/08/13 1032 09/19/13 0628  PROBNP 5402.0* 6856.0*   Studies: No results found. Scheduled Meds: . amiodarone  200 mg Oral Daily  . atorvastatin  40 mg Oral q1800  . azithromycin  250 mg Oral Daily  . diltiazem  300 mg Oral Daily  . guaiFENesin  1,200 mg Oral BID  . insulin aspart  0-9 Units Subcutaneous TID WC  . insulin aspart  3 Units Subcutaneous TID WC  . ipratropium-albuterol  3 mL Nebulization QID  . metoprolol succinate  50 mg Oral QPC breakfast  . mometasone-formoterol  2 puff Inhalation BID  . predniSONE  60 mg Oral QAC breakfast  . Rivaroxaban  15 mg Oral Q supper  . sodium chloride  3 mL Intravenous Q12H   Continuous Infusions:   Active Problems:   HTN (hypertension)   Atrial fibrillation   Long term (current) use of anticoagulants   Acute on chronic diastolic CHF (congestive heart failure)   COPD (chronic obstructive pulmonary disease)   Acute diastolic heart failure   Respiratory failure   Chronic kidney disease (CKD), stage III (moderate)  Time spent: 5025  Mark KochJai Donabelle Molden, MD [facility] [phone]) [phone]   09/23/2013, 2:00 PM  LOS: 4 days

## 2013-09-23 NOTE — Progress Notes (Signed)
Inpatient Diabetes Program Recommendations  AACE/ADA: New Consensus Statement on Inpatient Glycemic Control (2013)  Target Ranges:  Prepandial:   less than 140 mg/dL      Peak postprandial:   less than 180 mg/dL (1-2 hours)      Critically ill patients:  140 - 180 mg/dL   Inpatient Diabetes Program Recommendations HgbA1C: order to assess prehospital glucose control  Thank you  Jamir Rone BSN, RN,CDE Inpatient Diabetes Coordinator [phone] (team pager)  

## 2013-09-23 NOTE — Progress Notes (Signed)
Subjective: Patient denies dyspnea or chest pain; edema mildly improved.  Objective: Vital signs in last 24 hours: Temp:  [97.5 F (36.4 C)-97.9 F (36.6 C)] 97.9 F (36.6 C) (01/07 0431) Pulse Rate:  [59-129] 79 (01/07 0431) Resp:  [13-30] 18 (01/07 0431) BP: (134-166)/(69-110) 148/80 mmHg (01/07 0431) SpO2:  [90 %-99 %] 95 % (01/07 0431) Weight:  [156 lb 14.4 oz (71.169 kg)] 156 lb 14.4 oz (71.169 kg) (01/07 0431) Last BM Date: 09/20/13  Intake/Output from previous day: 01/06 0701 - 01/07 0700 In: 1010 [P.O.:960; I.V.:50] Out: 1450 [Urine:1450] Intake/Output this shift:    Medications Current Facility-Administered Medications  Medication Dose Route Frequency Provider Last Rate Last Dose  . acetaminophen (TYLENOL) tablet 650 mg  650 mg Oral Q6H PRN Costin Otelia Sergeant, MD      . alum & mag hydroxide-simeth (MAALOX/MYLANTA) 200-200-20 MG/5ML suspension 30 mL  30 mL Oral Q6H PRN Costin Otelia Sergeant, MD      . amiodarone (PACERONE) tablet 200 mg  200 mg Oral BID Leatha Gilding, MD   200 mg at 09/22/13 2045  . atorvastatin (LIPITOR) tablet 40 mg  40 mg Oral q1800 Leatha Gilding, MD   40 mg at 09/22/13 1722  . azithromycin (ZITHROMAX) tablet 250 mg  250 mg Oral Daily Leatha Gilding, MD   250 mg at 09/22/13 1541  . diltiazem (CARDIZEM CD) 24 hr capsule 300 mg  300 mg Oral Daily Costin Otelia Sergeant, MD   300 mg at 09/22/13 1541  . guaiFENesin (MUCINEX) 12 hr tablet 1,200 mg  1,200 mg Oral BID Leatha Gilding, MD   1,200 mg at 09/22/13 2044  . ipratropium-albuterol (DUONEB) 0.5-2.5 (3) MG/3ML nebulizer solution 3 mL  3 mL Nebulization QID Leatha Gilding, MD   3 mL at 09/22/13 2101  . ipratropium-albuterol (DUONEB) 0.5-2.5 (3) MG/3ML nebulizer solution 3 mL  3 mL Nebulization Q2H PRN Costin Otelia Sergeant, MD      . methylPREDNISolone sodium succinate (SOLU-MEDROL) 40 mg/mL injection 40 mg  40 mg Intravenous Q6H Costin Otelia Sergeant, MD   40 mg at 09/23/13 0148  . metoprolol succinate  (TOPROL-XL) 24 hr tablet 50 mg  50 mg Oral QPC breakfast Leatha Gilding, MD   50 mg at 09/22/13 1541  . mometasone-formoterol (DULERA) 100-5 MCG/ACT inhaler 2 puff  2 puff Inhalation BID Leatha Gilding, MD   2 puff at 09/22/13 2101  . ondansetron (ZOFRAN) tablet 4 mg  4 mg Oral Q6H PRN Costin Otelia Sergeant, MD      . oxyCODONE (Oxy IR/ROXICODONE) immediate release tablet 5 mg  5 mg Oral Q4H PRN Costin Otelia Sergeant, MD      . Rivaroxaban (XARELTO) tablet 15 mg  15 mg Oral Q supper Leatha Gilding, MD   15 mg at 09/22/13 1643  . sodium chloride 0.9 % injection 3 mL  3 mL Intravenous Q12H Leatha Gilding, MD   3 mL at 09/22/13 2045  . sodium chloride 0.9 % injection 3 mL  3 mL Intravenous PRN Costin Otelia Sergeant, MD        PE: WD, WN, NAD HEENT: normal Neck: supple  Chest: decreased BS bases CV irregular Abd: distended Ext: 2+ edema  Lab Results:   Recent Labs  09/22/13 0429 09/23/13 0320  WBC 13.6* 11.7*  HGB 10.3* 10.4*  HCT 31.7* 31.8*  PLT 175 187   BMET  Recent Labs  09/22/13 0429 09/22/13 2025 09/23/13 0320  NA 139 141 142  K 5.7* 5.3 4.4  CL 93* 93* 96  CO2 32 34* 33*  GLUCOSE 258* 402* 306*  BUN 62* 60* 59*  CREATININE 2.88* 2.90* 2.70*  CALCIUM 9.3 8.7 8.9    Assessment/Plan  Active Problems:   HTN (hypertension)   Atrial fibrillation   Long term (current) use of anticoagulants   Acute on chronic diastolic CHF (congestive heart failure)   COPD (chronic obstructive pulmonary disease)   Acute diastolic heart failure   Respiratory failure   Chronic kidney disease (CKD), stage III (moderate)  Plan: Pt in sinus s/p TEE/DCCV. Continue amiodarone (change to 200 mg daily); continue cardizem, toprol and xeralto (as his renal function improves, may need to change to 20 mg daily). He remains volume overloaded. However, this should improve now that sinus reestablished. Will give lasix 40 mg IV x 1 today and follow renal function. Continue pulmonary toilet. Possible DC  24-48 hours.  Mark [name] 7:29 AM

## 2013-09-23 NOTE — Progress Notes (Signed)
Pt o4x no complaints of pain or sob. Diet consult order to see pt on heart health diet . wil lcontinue to monitor

## 2013-09-24 LAB — BASIC METABOLIC PANEL
BUN: 59 mg/dL — AB (ref 6–23)
CO2: 34 mEq/L — ABNORMAL HIGH (ref 19–32)
Calcium: 8.8 mg/dL (ref 8.4–10.5)
Chloride: 93 mEq/L — ABNORMAL LOW (ref 96–112)
Creatinine, Ser: 2.45 mg/dL — ABNORMAL HIGH (ref 0.50–1.35)
GFR calc Af Amer: 29 mL/min — ABNORMAL LOW (ref >90)
GFR, EST NON AFRICAN AMERICAN: 25 mL/min — AB (ref >90)
Glucose, Bld: 273 mg/dL — ABNORMAL HIGH (ref 70–99)
Potassium: 4.4 mEq/L (ref 3.7–5.3)
SODIUM: 140 meq/L (ref 137–147)

## 2013-09-24 LAB — GLUCOSE, CAPILLARY
GLUCOSE-CAPILLARY: 237 mg/dL — AB (ref 70–99)
GLUCOSE-CAPILLARY: 204 mg/dL — AB (ref 70–99)
Glucose-Capillary: 275 mg/dL — ABNORMAL HIGH (ref 70–99)
Glucose-Capillary: 235 mg/dL — ABNORMAL HIGH (ref 70–99)

## 2013-09-24 MED ORDER — INSULIN ASPART 100 UNIT/ML ~~LOC~~ SOLN
0.0000 [IU] | Freq: Three times a day (TID) | SUBCUTANEOUS | Status: DC
  Administered 2013-09-24 – 2013-09-25 (×3): 5 [IU] via SUBCUTANEOUS
  Administered 2013-09-25 – 2013-09-26 (×2): 3 [IU] via SUBCUTANEOUS
  Administered 2013-09-26: 06:00:00 2 [IU] via SUBCUTANEOUS
  Administered 2013-09-27: 3 [IU] via SUBCUTANEOUS

## 2013-09-24 MED ORDER — INSULIN DETEMIR 100 UNIT/ML ~~LOC~~ SOLN
7.0000 [IU] | Freq: Every day | SUBCUTANEOUS | Status: DC
  Administered 2013-09-24: 7 [IU] via SUBCUTANEOUS
  Filled 2013-09-24 (×2): qty 0.07

## 2013-09-24 MED ORDER — FUROSEMIDE 10 MG/ML IJ SOLN
40.0000 mg | Freq: Two times a day (BID) | INTRAMUSCULAR | Status: DC
  Administered 2013-09-24 – 2013-09-27 (×6): 40 mg via INTRAVENOUS
  Filled 2013-09-24 (×6): qty 4

## 2013-09-24 MED ORDER — INSULIN PEN STARTER KIT
1.0000 | Freq: Once | Status: AC
  Administered 2013-09-24: 1
  Filled 2013-09-24 (×2): qty 1

## 2013-09-24 MED ORDER — INSULIN ASPART 100 UNIT/ML ~~LOC~~ SOLN
3.0000 [IU] | Freq: Three times a day (TID) | SUBCUTANEOUS | Status: DC
  Administered 2013-09-24 – 2013-09-27 (×10): 3 [IU] via SUBCUTANEOUS

## 2013-09-24 MED ORDER — METFORMIN HCL 500 MG PO TABS: 500.0000 mg | ORAL_TABLET | Freq: Two times a day (BID) | ORAL | Status: DC

## 2013-09-24 NOTE — Progress Notes (Signed)
PROGRESS NOTE  Mark [name] ZOX:[id number] DOB: 11-Feb-1946 DOA: 09/19/2013 PCP: Leo Grosser, MD  HPI  68 y.o. ? known COPD [unclear stage], stage III chronic kidney disease, atrial fibrillation, diastolic congestive heart failure[Nuc stress 10/09/12=LVEF59%, Nl L wall motion] , AAA repair 10/15/12 c multiple hospitalizations recently. Admit on 09/08/2013 discharged on 09/12/2013 at which time he presented with acute on chronic diastolic congestive heart failure. creatinine peaked at 3.49 on 09/11/2013. ?  diuretics were held on d/c. Snce discharge he has had progressive dyspnea at rest, cough associated with white sputum production, wheezing, worsening of bilateral lower extremity edema, 6 pound weight gain in 1 week. He denies chest pain, fevers chills, nausea or vomiting, abdominal pain, syncope, presyncope, bloody stools. ED work-up= BNP of 6856. EKG showed atrial fibrillation. patient noted to be an respiratory distress and was administered supplemental oxygen via nonrebreather.   Assessment/Plan: Acute on chronic diastolic congestive heart failure.  - worsening dyspnea at rest, 6 pound weight gain, increasing bilateral extremity pitting edema. - Is now -2.6 L since admission.  Discharge weight last hospital stay 154, current weight 156 - diuresing as tolerated per cardiology, Lasix IV twice a day 40 mg currently -Patient likely approaching ideal volume status -Cardiology to comment on discharge meds Multifactorial acute respiratory failure,  -likely secondary to combined congestive heart failure and COPD exacerbation. -Azithromycin 1/4-1/8  Potential pneumonia - CXR with left base opacification, Azithro discontinued 1/8 -1/9 repeat CXR pending Atrial fibrillation, CHad2Vasc2 score=2-3. S/p direct cardioversion 150 J 09/22/13 - Cont amiodarone 200 mg by mouth twice a day, metoprolol 50 mg daily and Cardizem 300 mg by mouth daily. -Needs TSH, LFTs in 4-6 weeks as is on chronic  amiodarone - Placed on Xarelto 15 mg daily due to renal fnxn. Mild metabolic alkalosis-labs a.m. Stage III chronic kidney disease.  -Patient's baseline creatinine near 2.3, 2.4. He presently appears to be at his baseline.  -We'll need to monitor closely given the need for IV diuresis Mild-mod mitral regurgitation-monitor Hypertension. Continue Cardizem and metoprolol therapy. Nutrition.  Hyperlipidemia -continue atorvastatin 40 daily Hyperglycemia , student used -Continue diabetic diet -Worsened by steroid-induced hyperglycemia -Start metformin 500 twice daily -Might need insulin teaching -Started Levemir 7 units, moderate sliding scale with 4 times a day coverage Normocytic anemia -Monitor as an outpatient as is on anticoagulation   Diet: Diabetic Fluids: none DVT Prophylaxis: Xarelto  Code Status: Full Family Communication: Discussed with wife and caregiver at bedside Disposition Plan: inpatient  Consultants:  Cardiology  Procedures:  Cardioversion 1/7   Antibiotics - none  HPI/Subjective:  Doing fair, feels great Ambulated a little today. No chest pain no nausea no shortness of breath   Objective: Filed Vitals:   09/24/13 0912 09/24/13 1059 09/24/13 1136 09/24/13 1500  BP:  144/77  139/72  Pulse:    78  Temp:    97.9 F (36.6 C)  TempSrc:    Oral  Resp:    20  Height:      Weight:      SpO2: 90%  90% 93%    Intake/Output Summary (Last 24 hours) at 09/24/13 1524 Last data filed at 09/24/13 1500  Gross per 24 hour  Intake   1200 ml  Output   1075 ml  Net    125 ml   Filed Weights   09/22/13 0735 09/23/13 0431 09/24/13 0547  Weight: 71.169 kg (156 lb 14.4 oz) 71.169 kg (156 lb 14.4 oz) 70.988 kg (156 lb 8 oz)  Exam:   General:  NAD, mildly dyspneic sitting at the edge of the bed  Cardiovascular: Regular normal sinus rhythm  Respiratory: no wheezing, bibasilar crackles.   Abdomen: soft, not tender to palpation, positive bowel  sounds  MSK: 1+ peripheral edema  Neuro: non focal  Data Reviewed: Basic Metabolic Panel:  Recent Labs Lab 09/21/13 0636 09/22/13 0429 09/22/13 2025 09/23/13 0320 09/24/13 0245  NA 138 139 141 142 140  K 5.2 5.7* 5.3 4.4 4.4  CL 94* 93* 93* 96 93*  CO2 26 32 34* 33* 34*  GLUCOSE 183* 258* 402* 306* 273*  BUN 52* 62* 60* 59* 59*  CREATININE 2.78* 2.88* 2.90* 2.70* 2.45*  CALCIUM 8.9 9.3 8.7 8.9 8.8   Liver Function Tests:  Recent Labs Lab 09/19/13 0628  AST 15  ALT 23  ALKPHOS 88  BILITOT 0.3  PROT 6.9  ALBUMIN 3.0*   CBC:  Recent Labs Lab 09/19/13 0628 09/19/13 0634 09/20/13 0420 09/22/13 0429 09/23/13 0320  WBC 9.3  --  10.3 13.6* 11.7*  NEUTROABS 7.7  --   --   --   --   HGB 12.4* 13.6 9.9* 10.3* 10.4*  HCT 37.9* 40.0 29.3* 31.7* 31.8*  MCV 97.9  --  94.8 96.6 96.1  PLT 130*  --  129* 175 187   Cardiac Enzymes:  Recent Labs Lab 09/19/13 1040 09/19/13 1545 09/19/13 2110  TROPONINI <0.30 <0.30 <0.30   BNP (last 3 results)  Recent Labs  09/08/13 1032 09/19/13 0628  PROBNP 5402.0* 6856.0*   Studies: No results found. Scheduled Meds: . amiodarone  200 mg Oral Daily  . atorvastatin  40 mg Oral q1800  . azithromycin  250 mg Oral Daily  . diltiazem  300 mg Oral Daily  . furosemide  40 mg Intravenous BID  . guaiFENesin  1,200 mg Oral BID  . insulin aspart  0-9 Units Subcutaneous TID WC  . insulin aspart  3 Units Subcutaneous TID WC  . ipratropium-albuterol  3 mL Nebulization QID  . metoprolol succinate  50 mg Oral QPC breakfast  . mometasone-formoterol  2 puff Inhalation BID  . predniSONE  60 mg Oral QAC breakfast  . Rivaroxaban  15 mg Oral Q supper  . sodium chloride  3 mL Intravenous Q12H   Continuous Infusions:   Active Problems:   HTN (hypertension)   Atrial fibrillation   Long term (current) use of anticoagulants   Acute on chronic diastolic CHF (congestive heart failure)   COPD (chronic obstructive pulmonary disease)    Acute diastolic heart failure   Respiratory failure   Chronic kidney disease (CKD), stage III (moderate)  Time spent: 4325  Pleas KochJai Mart Colpitts, MD [facility] [phone](P) [phone]   09/24/2013, 3:24 PM  LOS: 5 days

## 2013-09-24 NOTE — Progress Notes (Signed)
UR completed Lezlie Ritchey K. Sebrena Engh, RN, BSN, MSHL, CCM  09/24/2013 5:21 PM

## 2013-09-24 NOTE — Progress Notes (Signed)
Subjective: Patient denies dyspnea or chest pain; edema mildly improved.  Objective: Vital signs in last 24 hours: Temp:  [97.5 F (36.4 C)-98.2 F (36.8 C)] 98.2 F (36.8 C) (01/08 0547) Pulse Rate:  [69-81] 81 (01/08 0547) Resp:  [18] 18 (01/08 0547) BP: (132-162)/(74-95) 162/79 mmHg (01/08 0547) SpO2:  [90 %-95 %] 90 % (01/08 0912) Weight:  [156 lb 8 oz (70.988 kg)] 156 lb 8 oz (70.988 kg) (01/08 0547) Last BM Date: 09/23/13  Intake/Output from previous day: 01/07 0701 - 01/08 0700 In: 1080 [P.O.:1080] Out: 1600 [Urine:1600] Intake/Output this shift: Total I/O In: 360 [P.O.:360] Out: 225 [Urine:225]  Medications Current Facility-Administered Medications  Medication Dose Route Frequency Provider Last Rate Last Dose  . acetaminophen (TYLENOL) tablet 650 mg  650 mg Oral Q6H PRN Costin Otelia Sergeant, MD      . alum & mag hydroxide-simeth (MAALOX/MYLANTA) 200-200-20 MG/5ML suspension 30 mL  30 mL Oral Q6H PRN Costin Otelia Sergeant, MD      . amiodarone (PACERONE) tablet 200 mg  200 mg Oral Daily Lewayne Bunting, MD   200 mg at 09/23/13 1027  . atorvastatin (LIPITOR) tablet 40 mg  40 mg Oral q1800 Leatha Gilding, MD   40 mg at 09/23/13 1809  . azithromycin (ZITHROMAX) tablet 250 mg  250 mg Oral Daily Leatha Gilding, MD   250 mg at 09/23/13 1027  . diltiazem (CARDIZEM CD) 24 hr capsule 300 mg  300 mg Oral Daily Costin Otelia Sergeant, MD   300 mg at 09/23/13 1027  . guaiFENesin (MUCINEX) 12 hr tablet 1,200 mg  1,200 mg Oral BID Leatha Gilding, MD   1,200 mg at 09/23/13 2153  . insulin aspart (novoLOG) injection 0-9 Units  0-9 Units Subcutaneous TID WC Rhetta Mura, MD   3 Units at 09/24/13 0630  . insulin aspart (novoLOG) injection 3 Units  3 Units Subcutaneous TID WC Rhetta Mura, MD   3 Units at 09/23/13 1643  . ipratropium-albuterol (DUONEB) 0.5-2.5 (3) MG/3ML nebulizer solution 3 mL  3 mL Nebulization QID Leatha Gilding, MD   3 mL at 09/24/13 0910  .  ipratropium-albuterol (DUONEB) 0.5-2.5 (3) MG/3ML nebulizer solution 3 mL  3 mL Nebulization Q2H PRN Costin Otelia Sergeant, MD      . metoprolol succinate (TOPROL-XL) 24 hr tablet 50 mg  50 mg Oral QPC breakfast Leatha Gilding, MD   50 mg at 09/23/13 0855  . mometasone-formoterol (DULERA) 100-5 MCG/ACT inhaler 2 puff  2 puff Inhalation BID Leatha Gilding, MD   2 puff at 09/24/13 0910  . ondansetron (ZOFRAN) tablet 4 mg  4 mg Oral Q6H PRN Costin Otelia Sergeant, MD      . oxyCODONE (Oxy IR/ROXICODONE) immediate release tablet 5 mg  5 mg Oral Q4H PRN Costin Otelia Sergeant, MD      . predniSONE (DELTASONE) tablet 60 mg  60 mg Oral QAC breakfast Rhetta Mura, MD   60 mg at 09/24/13 0612  . Rivaroxaban (XARELTO) tablet 15 mg  15 mg Oral Q supper Leatha Gilding, MD   15 mg at 09/23/13 1643  . sodium chloride 0.9 % injection 3 mL  3 mL Intravenous Q12H Leatha Gilding, MD   3 mL at 09/23/13 2154  . sodium chloride 0.9 % injection 3 mL  3 mL Intravenous PRN Costin Otelia Sergeant, MD        PE: WD, WN, NAD HEENT: normal Neck: supple  Chest: decreased BS bases  CV irregular Abd: distended Ext: 2+ edema  Lab Results:   Recent Labs  09/22/13 0429 09/23/13 0320  WBC 13.6* 11.7*  HGB 10.3* 10.4*  HCT 31.7* 31.8*  PLT 175 187   BMET  Recent Labs  09/22/13 2025 09/23/13 0320 09/24/13 0245  NA 141 142 140  K 5.3 4.4 4.4  CL 93* 96 93*  CO2 34* 33* 34*  GLUCOSE 402* 306* 273*  BUN 60* 59* 59*  CREATININE 2.90* 2.70* 2.45*  CALCIUM 8.7 8.9 8.8    Assessment/Plan  Active Problems:   HTN (hypertension)   Atrial fibrillation   Long term (current) use of anticoagulants   Acute on chronic diastolic CHF (congestive heart failure)   COPD (chronic obstructive pulmonary disease)   Acute diastolic heart failure   Respiratory failure   Chronic kidney disease (CKD), stage III (moderate)  Plan: Pt remains in sinus s/p TEE/DCCV. Continue amiodarone; continue cardizem, toprol and xeralto (as his  renal function improves, may need to change to 20 mg daily). He remains volume overloaded. However, this is improving now that sinus reestablished. Will give lasix 40 mg IV BID today and follow renal function. Continue pulmonary toilet. Possible in AM if stable.  Olga MillersBrian Jesseka Drinkard 9:59 AM

## 2013-09-24 NOTE — Progress Notes (Signed)
PHARMACIST - PHYSICIAN COMMUNICATION DR: Mahala MenghiniSAMTANI CONCERNING:  METFORMIN SAFE ADMINISTRATION POLICY  RECOMMENDATION: Metformin has been placed on DISCONTINUE (rejected order) STATUS and should be reordered only after any of the conditions below are ruled out.  Current safety recommendations include avoiding metformin for a minimum of 48 hours after the patient's exposure to intravenous contrast media.  DESCRIPTION:  The Pharmacy Committee has adopted a policy that restricts the use of metformin in hospitalized patients until all the contraindications to administration have been ruled out. Specific contraindications are: [x]  Serum creatinine ? 1.5 for males []  Serum creatinine ? 1.4 for females []  Shock, acute MI, sepsis, hypoxemia, dehydration []  Planned administration of intravenous iodinated contrast media []  Heart Failure patients with low EF []  Acute or chronic metabolic acidosis (including DKA)     Thank you for allowing me to take part in this patient's care,  Abran DukeJames Lachanda Buczek, PharmD Clinical Pharmacist Phone: [phone] Pager: [phone] 09/24/2013 10:24 PM

## 2013-09-24 NOTE — Progress Notes (Signed)
Inpatient Diabetes Program Recommendations  AACE/ADA: New Consensus Statement on Inpatient Glycemic Control (2013)  Target Ranges:  Prepandial:   less than 140 mg/dL      Peak postprandial:   less than 180 mg/dL (1-2 hours)      Critically ill patients:  140 - 180 mg/dL  Results for Mark [name], Mark [name] (MRN [id number]) as of 09/24/2013 13:17  Ref. Range 09/23/2013 01:45 09/23/2013 11:17 09/23/2013 16:14 09/23/2013 20:53 09/24/2013 06:20 09/24/2013 10:49  Glucose-Capillary Latest Range: 70-99 mg/dL 409321 (H) 811289 (H) 914219 (H) 249 (H) 237 (H) 275 (H)    Inpatient Diabetes Program Recommendations HgbA1C: order to assess prehospital glucose control May benefit from addition of low dose basal during steroid therapy. Increase to resistant scale. Thank you  Piedad ClimesGina Imanol Bihl BSN, RN,CDE Inpatient Diabetes Coordinator [phone] (team pager)

## 2013-09-24 NOTE — Evaluation (Signed)
Physical Therapy Evaluation Patient Details Name: Mark [name] MRN: [id number] DOB: June 26, 1946 Today's Date: 09/24/2013 Time: [id number] PT Time Calculation (min): 15 min  PT Assessment / Plan / Recommendation History of Present Illness  Pt adm with CHF.  Clinical Impression  Pt admitted with above. Pt currently with functional limitations due to the deficits listed below (see PT Problem List). Pt may need to use his O2 when he initially goes home as he desats with activity at present time.  Already has home O2 per pt but used it prn prior to admit.   Pt will benefit from skilled PT to increase their independence and safety with mobility to allow discharge to the venue listed below.     PT Assessment  Patient needs continued PT services    Follow Up Recommendations  Home health PT;Supervision/Assistance - 24 hour                Equipment Recommendations  None recommended by PT         Frequency Min 3X/week    Precautions / Restrictions Precautions Precautions: None Restrictions Weight Bearing Restrictions: No   Pertinent Vitals/Pain Desat to 82% on RA with ambulation.  Replaced O2 at 2L and O2 took 2 min to return to >90%.  Other VSS, no pain      Mobility  Bed Mobility Overal bed mobility: Independent Transfers Overall transfer level: Independent Equipment used: None Ambulation/Gait Ambulation/Gait assistance: Min guard Ambulation Distance (Feet): 150 Feet Assistive device: None Gait Pattern/deviations: Decreased stride length Gait velocity interpretation: Below normal speed for age/gender General Gait Details: No specific balance deficits without device.  Pt did desat to 82% toward end of walk and took 2 min for O2 at 2LO2 to return to >90%    Exercises General Exercises - Lower Extremity Ankle Circles/Pumps: AROM;Both;5 reps;Seated Long Arc Quad: AROM;Both;10 reps;Seated   PT Diagnosis: Generalized weakness  PT Problem List: Decreased activity  tolerance;Decreased balance;Decreased mobility;Decreased knowledge of use of DME;Decreased safety awareness;Decreased knowledge of precautions PT Treatment Interventions: DME instruction;Gait training;Functional mobility training;Therapeutic activities;Therapeutic exercise;Balance training;Patient/family education     PT Goals(Current goals can be found in the care plan section) Acute Rehab PT Goals Patient Stated Goal: to go home PT Goal Formulation: With patient Time For Goal Achievement: 10/01/13 Potential to Achieve Goals: Good  Visit Information  Last PT Received On: 09/24/13 Assistance Needed: +1 History of Present Illness: Pt adm with CHF.       Prior Functioning  Home Living Family/patient expects to be discharged to:: Private residence Living Arrangements: Spouse/significant other;Children;Other relatives Available Help at Discharge: Family Type of Home: House Home Access: Stairs to enter Entergy CorporationEntrance Stairs-Number of Steps: 4-5 Entrance Stairs-Rails: Right;Left Home Layout: One level Home Equipment: Shower seat - built in;Cane - single point;Electric scooter (has O2 at home prn but hadn't been using it) Prior Function Level of Independence: Independent Communication Communication: HOH Dominant Hand: Left    Cognition  Cognition Arousal/Alertness: Awake/alert Behavior During Therapy: WFL for tasks assessed/performed Overall Cognitive Status: Within Functional Limits for tasks assessed    Extremity/Trunk Assessment Upper Extremity Assessment Upper Extremity Assessment: Defer to OT evaluation Lower Extremity Assessment Lower Extremity Assessment: Generalized weakness Cervical / Trunk Assessment Cervical / Trunk Assessment: Normal   Balance Balance Overall balance assessment: No apparent balance deficits (not formally assessed)  End of Session PT - End of Session Equipment Utilized During Treatment: Gait belt;Oxygen Activity Tolerance: Patient limited by  fatigue Patient left: in bed;with call bell/phone within reach  Nurse Communication: Mobility status       INGOLD,Jakarri Lesko 09/24/2013, 4:15 PM [facility] Acute Rehabilitation [phone] (pager)

## 2013-09-25 ENCOUNTER — Inpatient Hospital Stay (HOSPITAL_COMMUNITY): Payer: Medicare Other

## 2013-09-25 LAB — GLUCOSE, CAPILLARY
GLUCOSE-CAPILLARY: 164 mg/dL — AB (ref 70–99)
GLUCOSE-CAPILLARY: 216 mg/dL — AB (ref 70–99)
Glucose-Capillary: 223 mg/dL — ABNORMAL HIGH (ref 70–99)

## 2013-09-25 LAB — RENAL FUNCTION PANEL
ALBUMIN: 2.6 g/dL — AB (ref 3.5–5.2)
BUN: 61 mg/dL — AB (ref 6–23)
CO2: 35 meq/L — AB (ref 19–32)
Calcium: 8.5 mg/dL (ref 8.4–10.5)
Chloride: 96 mEq/L (ref 96–112)
Creatinine, Ser: 2.36 mg/dL — ABNORMAL HIGH (ref 0.50–1.35)
GFR calc Af Amer: 31 mL/min — ABNORMAL LOW (ref >90)
GFR calc non Af Amer: 26 mL/min — ABNORMAL LOW (ref >90)
GLUCOSE: 182 mg/dL — AB (ref 70–99)
POTASSIUM: 4.4 meq/L (ref 3.7–5.3)
Phosphorus: 3 mg/dL (ref 2.3–4.6)
Sodium: 144 mEq/L (ref 137–147)

## 2013-09-25 MED ORDER — AMIODARONE HCL 200 MG PO TABS
400.0000 mg | ORAL_TABLET | Freq: Two times a day (BID) | ORAL | Status: DC
  Administered 2013-09-25 – 2013-09-29 (×8): 400 mg via ORAL
  Filled 2013-09-25 (×10): qty 2

## 2013-09-25 MED ORDER — PREDNISONE 10 MG PO TABS
10.0000 mg | ORAL_TABLET | Freq: Every day | ORAL | Status: DC
  Filled 2013-09-25: qty 1

## 2013-09-25 MED ORDER — METOPROLOL SUCCINATE ER 50 MG PO TB24
75.0000 mg | ORAL_TABLET | Freq: Every day | ORAL | Status: DC
  Administered 2013-09-25 – 2013-09-29 (×5): 75 mg via ORAL
  Filled 2013-09-25 (×6): qty 1

## 2013-09-25 MED ORDER — INSULIN DETEMIR 100 UNIT/ML ~~LOC~~ SOLN
10.0000 [IU] | Freq: Every day | SUBCUTANEOUS | Status: DC
  Administered 2013-09-25 – 2013-09-28 (×4): 10 [IU] via SUBCUTANEOUS
  Filled 2013-09-25 (×5): qty 0.1

## 2013-09-25 MED ORDER — BD GETTING STARTED TAKE HOME KIT: 3/10ML X 30G SYRINGES
1.0000 | Freq: Once | Status: AC
  Administered 2013-09-25: 18:00:00 1
  Filled 2013-09-25: qty 1

## 2013-09-25 NOTE — Progress Notes (Signed)
Pt's hr elevated . Pt awake ambulating to bathroom. Pt w/o c/o palpitations nor chest pains. Telemetry has been on st to afib.pt asymptomatic

## 2013-09-25 NOTE — Progress Notes (Signed)
0830 Seen by Sharene SkeansBryan Hager,PA with orders

## 2013-09-25 NOTE — Progress Notes (Signed)
Inpatient Diabetes Program Recommendations  AACE/ADA: New Consensus Statement on Inpatient Glycemic Control (2013)  Target Ranges:  Prepandial:   less than 140 mg/dL      Peak postprandial:   less than 180 mg/dL (1-2 hours)      Critically ill patients:  140 - 180 mg/dL   Reason for Visit: patient new to insulin.  This coordinator met with patient's wife and granddaughter to answer any questions/concerns about insulin administration.  Patient was walking with therapy.  Patient's granddaughter stated that she will learn how to give his insulin because she will be assisting him at home.  Patient is not being discharged today.  Discussed insulin admin education with Alwyn Pea.  Patient/family should be encouraged and allowed to assist in insulin admin during this hospitalization.  Insulin starter kit and insulin videos on the patient ed network have also been ordered.    Inpatient Diabetes Program Recommendations HgbA1C: order to assess prehospital glucose control  Thank you  Raoul Pitch BSN, RN,CDE Inpatient Diabetes Coordinator [phone] (team pager)

## 2013-09-25 NOTE — Progress Notes (Signed)
EKG done. Showed afib. HR=103. Pt asymptomatic. Continued to observe pt closely.

## 2013-09-25 NOTE — Progress Notes (Signed)
Subjective: FEels good.  Still with orthopnea.  Objective: Vital signs in last 24 hours: Temp:  [97.6 F (36.4 C)-98 F (36.7 C)] 98 F (36.7 C) (01/09 0700) Pulse Rate:  [78-87] 87 (01/09 0700) Resp:  [18-20] 18 (01/09 0700) BP: (128-155)/(72-108) 155/108 mmHg (01/09 0700) SpO2:  [90 %-96 %] 93 % (01/08 2051) Weight:  [156 lb 14.4 oz (71.169 kg)] 156 lb 14.4 oz (71.169 kg) (01/09 0700) Last BM Date: 09/23/13  Intake/Output from previous day: 01/08 0701 - 01/09 0700 In: 1320 [P.O.:1320] Out: 1725 [Urine:1725] Intake/Output this shift:    Medications Current Facility-Administered Medications  Medication Dose Route Frequency Provider Last Rate Last Dose  . acetaminophen (TYLENOL) tablet 650 mg  650 mg Oral Q6H PRN Costin Otelia SergeantM Gherghe, MD      . alum & mag hydroxide-simeth (MAALOX/MYLANTA) 200-200-20 MG/5ML suspension 30 mL  30 mL Oral Q6H PRN Costin Otelia SergeantM Gherghe, MD      . amiodarone (PACERONE) tablet 200 mg  200 mg Oral Daily Lewayne BuntingBrian S Corianne Buccellato, MD   200 mg at 09/24/13 1100  . atorvastatin (LIPITOR) tablet 40 mg  40 mg Oral q1800 Leatha Gildingostin M Gherghe, MD   40 mg at 09/24/13 1819  . diltiazem (CARDIZEM CD) 24 hr capsule 300 mg  300 mg Oral Daily Costin Otelia SergeantM Gherghe, MD   300 mg at 09/24/13 1059  . furosemide (LASIX) injection 40 mg  40 mg Intravenous BID Lewayne BuntingBrian S Ariyan Brisendine, MD      . guaiFENesin [facility](MUCINEX) 12 hr tablet 1,200 mg  1,200 mg Oral BID Leatha Gildingostin M Gherghe, MD   1,200 mg at 09/24/13 2200  . insulin aspart (novoLOG) injection 0-15 Units  0-15 Units Subcutaneous TID WC Rhetta MuraJai-Gurmukh Samtani, MD   3 Units at 09/25/13 0714  . insulin aspart (novoLOG) injection 3 Units  3 Units Subcutaneous TID WC Rhetta MuraJai-Gurmukh Samtani, MD   3 Units at 09/24/13 1821  . insulin detemir (LEVEMIR) injection 7 Units  7 Units Subcutaneous QHS Rhetta MuraJai-Gurmukh Samtani, MD   7 Units at 09/24/13 2200  . ipratropium-albuterol (DUONEB) 0.5-2.5 (3) MG/3ML nebulizer solution 3 mL  3 mL Nebulization QID Leatha Gildingostin M Gherghe, MD   3 mL  at 09/24/13 2030  . ipratropium-albuterol (DUONEB) 0.5-2.5 (3) MG/3ML nebulizer solution 3 mL  3 mL Nebulization Q2H PRN Costin Otelia SergeantM Gherghe, MD      . metoprolol succinate (TOPROL-XL) 24 hr tablet 50 mg  50 mg Oral QPC breakfast Costin Otelia SergeantM Gherghe, MD   50 mg at 09/24/13 1100  . mometasone-formoterol (DULERA) 100-5 MCG/ACT inhaler 2 puff  2 puff Inhalation BID Leatha Gildingostin M Gherghe, MD   2 puff at 09/24/13 2030  . ondansetron (ZOFRAN) tablet 4 mg  4 mg Oral Q6H PRN Costin Otelia SergeantM Gherghe, MD      . oxyCODONE (Oxy IR/ROXICODONE) immediate release tablet 5 mg  5 mg Oral Q4H PRN Costin Otelia SergeantM Gherghe, MD      . predniSONE (DELTASONE) tablet 60 mg  60 mg Oral QAC breakfast Rhetta MuraJai-Gurmukh Samtani, MD   60 mg at 09/25/13 0600  . Rivaroxaban (XARELTO) tablet 15 mg  15 mg Oral Q supper Leatha Gildingostin M Gherghe, MD   15 mg at 09/24/13 1819  . sodium chloride 0.9 % injection 3 mL  3 mL Intravenous Q12H Costin Otelia SergeantM Gherghe, MD   3 mL at 09/24/13 2200  . sodium chloride 0.9 % injection 3 mL  3 mL Intravenous PRN Costin Otelia SergeantM Gherghe, MD        PE: General  appearance: alert, cooperative and no distress Neck: No Appreciable JVD. Lungs: Bilaterall wheeze and rales Heart: irregularly irregular rhythm and No MM Extremities: 2+ LEE Pulses: 2+ and symmetric Skin: Warm and dry Neurologic: Grossly normal  Lab Results:   Recent Labs  09/23/13 0320  WBC 11.7*  HGB 10.4*  HCT 31.8*  PLT 187   BMET  Recent Labs  09/23/13 0320 09/24/13 0245 09/25/13 0505  NA 142 140 144  K 4.4 4.4 4.4  CL 96 93* 96  CO2 33* 34* 35*  GLUCOSE 306* 273* 182*  BUN 59* 59* 61*  CREATININE 2.70* 2.45* 2.36*  CALCIUM 8.9 8.8 8.5     Assessment/Plan   Active Problems:   HTN (hypertension)   Atrial fibrillation   Long term (current) use of anticoagulants   Acute on chronic diastolic CHF (congestive heart failure)   COPD (chronic obstructive pulmonary disease)   Acute diastolic heart failure   Respiratory failure   Chronic kidney disease (CKD),  stage III (moderate)  Plan:  Unfortunately the patient is back in Afib with a rate of ~115.  The patient was not aware and states he feels good.  Amiodarone 200mg  daily, Cardizem CD 300mg , Toprol XL 50, Xarelto.   Will increase toprol Xl to 75mg  daily for rate control and BP.    Net fluids -0.4L/-3.0L.  He has diuresed but needs more.  Continue currently lasix dosing.   SCr improved a little more.   CXR: Small pleural effusions with persistent left base streaky airspace disease concerning for pneumonia.  WBCs two days ago were elevated mildly. Was on azithromycin 1/4-1/8.      LOS: 6 days    HAGER, BRYAN 09/25/2013 8:15 AM  As above; patient seen and examined; denies dyspnea or chest pain; back in atrial fibrillation; patient has been significantly symptomatic with afib with dyspnea and volume excess; plan change amiodarone to 400 BID; continue xeralto; repeat DCCV Monday or Tuesday; hopefully he will hold sinus with more amiodarone. HR elevated; increase toprol and adjust as needed. Watch renal function (was improving with reestablishment of sinus); GFR today 30; if deteriorates further, may need to change xeralto to apixaban. Arlys John Noe Goyer 10:30 AM

## 2013-09-25 NOTE — Progress Notes (Signed)
Physical Therapy Treatment Patient Details Name: Mark [name] MRN: [id number] DOB: [birth year] Today's Date: 09/25/2013 Time: 9223-0097 PT Time Calculation (min): 18 min  PT Assessment / Plan / Recommendation  History of Present Illness Pt adm with CHF.   PT Comments   Pt able to gait independently with no LOB, no AD.  All PT goals met. Pt with no further acute PT needs.  Pt may benefit from cardiopulmonary rehab for activity tolerance.  Follow Up Recommendations  No PT follow up;Supervision/Assistance - 24 hour (pt may benefit from cardio pulmonary rehab)     Does the patient have the potential to tolerate intense rehabilitation     Barriers to Discharge        Equipment Recommendations  None recommended by PT    Recommendations for Other Services    Frequency     Progress towards PT Goals Progress towards PT goals: Goals met/education completed, patient discharged from PT  Plan Other (comment) (all PT goals met, d/c from PT )    Precautions / Restrictions Precautions Precautions: None Restrictions Weight Bearing Restrictions: No   Pertinent Vitals/Pain spO2 on 2L O2 during gait 92%, on 1L O2 during gait 85%. Requires 2 minutes seated rest to return to >90% on 2L O2.    Mobility  Bed Mobility Overal bed mobility: Independent Transfers Overall transfer level: Independent Equipment used: None Ambulation/Gait Ambulation/Gait assistance: Independent Ambulation Distance (Feet): 250 Feet Assistive device: None General Gait Details: Pt on 2L O2 during walk. Pt at 92% on 2L O2.  Attempted gait with 1L O2, pt desats to 85% on 1 L.  Required 2 minutes seated rest with 2L O2 to return to > 90%    Exercises     PT Diagnosis:    PT Problem List:   PT Treatment Interventions:     PT Goals (current goals can now be found in the care plan section)    Visit Information  Last PT Received On: 09/25/13 Assistance Needed: +1 History of Present Illness: Pt adm with CHF.     Subjective Data      Cognition  Cognition Arousal/Alertness: Awake/alert Behavior During Therapy: WFL for tasks assessed/performed Overall Cognitive Status: Within Functional Limits for tasks assessed    Balance     End of Session PT - End of Session Equipment Utilized During Treatment: Oxygen Activity Tolerance: Patient tolerated treatment well Patient left: in bed;with family/visitor present;with call bell/phone within reach   GP     [facility] 09/25/2013, 2:01 PM

## 2013-09-25 NOTE — Progress Notes (Signed)
PROGRESS NOTE  Mark [name] UJW:[id number] DOB: 10-29-1945 DOA: 09/19/2013 PCP: Odette Fraction, MD  HPI  68 y.o. ? known COPD [unclear stage], stage III chronic kidney disease, atrial fibrillation, diastolic congestive heart failure[Nuc stress 10/09/12=LVEF59%, Nl L wall motion] , AAA repair 10/15/12 c multiple hospitalizations recently. Admit on 09/08/2013 discharged on 09/12/2013 at which time he presented with acute on chronic diastolic congestive heart failure. creatinine peaked at 3.49 on 09/11/2013. ?  diuretics were held on d/c. Snce discharge he has had progressive dyspnea at rest, cough associated with white sputum production, wheezing, worsening of bilateral lower extremity edema, 6 pound weight gain in 1 week. He denies chest pain, fevers chills, nausea or vomiting, abdominal pain, syncope, presyncope, bloody stools. ED work-up= BNP of 6856. EKG showed atrial fibrillation. patient noted to be an respiratory distress and was administered supplemental oxygen via nonrebreather.  He was cardioverted with DC cardioversion on 09/23/13 but then reverted to A. fib at 115 to 120 range Cardiology managing  Assessment/Plan: Acute on chronic diastolic congestive heart failure.  - worsening dyspnea at rest, 6 pound weight gain, increasing bilateral extremity pitting edema. - Is now -3.5 L since admission.  Discharge weight last hospital stay 154, current weight 156 and holding - diuresing as tolerated per cardiology, Lasix IV twice a day 40 mg currently Atrial fibrillation, CHad2Vasc2 score=2-3. S/p direct cardioversion 150 J 09/22/13 - Cont amiodarone 200 mg by mouth twice a day, metoprolol 50 mg daily and Cardizem 300 mg by mouth daily. -Needs TSH, LFTs in 4-6 weeks as is on chronic amiodarone - Placed on Xarelto 15 mg daily due to renal fnxn. -He'll be seen early next week for possible repeat DC cardioversion from my understanding Multifactorial acute respiratory failure,  -likely  secondary to combined congestive heart failure and COPD exacerbation. -Azithromycin 1/4-1/8 -Afebrile, leukocytosis 2/2 prednisone  Potential pneumonia - CXR with left base opacification, Azithro discontinued 1/8 -1/9 repeat CXR pending  COPD, unclear as to stage -Placed on IV steroids on admission, rapid taper to off 01/21/20 Mild metabolic alkalosis -labs a.m. Stage III chronic kidney disease.  -Patient's baseline creatinine near 2.3, 2.4. He presently appears to be at his baseline.  -We'll need to monitor closely given the need for IV diuresis Mild-mod mitral regurgitation -monitor Hypertension - Continue Cardizem and metoprolol therapy. Nutrition.  Hyperlipidemia -continue atorvastatin 40 daily Hyperglycemia , steroid-induced -Continue diabetic diet -Worsened by steroid-induced hyperglycemia -Start metformin 500 twice daily on 09/24/13 -Insulin teaching started -Started Levemir 7 units---> 10 units, moderate sliding scale with 4 times a day coverage Normocytic anemia -Monitor as an outpatient as is on anticoagulation   Diet: Diabetic Fluids: none DVT Prophylaxis: Xarelto  Code Status: Full Family Communication: Discussed with wife and caregiver at bedside Disposition Plan: inpatient  Consultants:  Cardiology  Procedures:  Cardioversion 1/7   Antibiotics - none  HPI/Subjective:  Frustrated. Wants to go home. No nausea no vomiting no chest pain Tolerating diet   Objective: Filed Vitals:   09/25/13 0903 09/25/13 1132 09/25/13 1426 09/25/13 1527  BP: 149/95  128/67   Pulse:   86   Temp:   97.3 F (36.3 C)   TempSrc:   Oral   Resp:   18   Height:      Weight:      SpO2:  95% 95% 96%    Intake/Output Summary (Last 24 hours) at 09/25/13 1547 Last data filed at 09/25/13 1427  Gross per 24 hour  Intake  960 ml  Output   1850 ml  Net   -890 ml   Filed Weights   09/23/13 0431 09/24/13 0547 09/25/13 0700  Weight: 71.169 kg (156 lb 14.4 oz) 70.988  kg (156 lb 8 oz) 71.169 kg (156 lb 14.4 oz)    Exam:   General:  NAD, mildly dyspneic sitting at the edge of the bed  Cardiovascular: Regular normal sinus rhythm  Respiratory: no wheezing, bibasilar crackles.   Abdomen: soft, not tender to palpation, positive bowel sounds  MSK: 1+ peripheral edema  Neuro: non focal  Data Reviewed: Basic Metabolic Panel:  Recent Labs Lab 09/22/13 0429 09/22/13 2025 09/23/13 0320 09/24/13 0245 09/25/13 0505  NA 139 141 142 140 144  K 5.7* 5.3 4.4 4.4 4.4  CL 93* 93* 96 93* 96  CO2 32 34* 33* 34* 35*  GLUCOSE 258* 402* 306* 273* 182*  BUN 62* 60* 59* 59* 61*  CREATININE 2.88* 2.90* 2.70* 2.45* 2.36*  CALCIUM 9.3 8.7 8.9 8.8 8.5  PHOS  --   --   --   --  3.0   Liver Function Tests:  Recent Labs Lab 09/19/13 0628 09/25/13 0505  AST 15  --   ALT 23  --   ALKPHOS 88  --   BILITOT 0.3  --   PROT 6.9  --   ALBUMIN 3.0* 2.6*   CBC:  Recent Labs Lab 09/19/13 0628 09/19/13 0634 09/20/13 0420 09/22/13 0429 09/23/13 0320  WBC 9.3  --  10.3 13.6* 11.7*  NEUTROABS 7.7  --   --   --   --   HGB 12.4* 13.6 9.9* 10.3* 10.4*  HCT 37.9* 40.0 29.3* 31.7* 31.8*  MCV 97.9  --  94.8 96.6 96.1  PLT 130*  --  129* 175 187   Cardiac Enzymes:  Recent Labs Lab 09/19/13 1040 09/19/13 1545 09/19/13 2110  TROPONINI <0.30 <0.30 <0.30   BNP (last 3 results)  Recent Labs  09/08/13 1032 09/19/13 0628  PROBNP 5402.0* 6856.0*   Studies: Dg Chest 2 View  09/25/2013   CLINICAL DATA:  Pneumonia, shortness of breath  EXAM: CHEST  2 VIEW  COMPARISON:  09/19/2013  FINDINGS: Stable small pleural effusions, slightly larger on the left. Left lower lobe retrocardiac streaky opacity persist without significant change compatible with pneumonia. Minor right base atelectasis. Upper lungs clear. Normal heart size and vascularity. No pneumothorax. Trachea is midline.  IMPRESSION: Small pleural effusions with persistent left base streaky airspace disease  concerning for pneumonia   Electronically Signed   By: Daryll Brod M.D.   On: 09/25/2013 08:06   Scheduled Meds: . amiodarone  400 mg Oral BID  . atorvastatin  40 mg Oral q1800  . bd getting started take home kit  1 kit Other Once  . diltiazem  300 mg Oral Daily  . furosemide  40 mg Intravenous BID  . guaiFENesin  1,200 mg Oral BID  . insulin aspart  0-15 Units Subcutaneous TID WC  . insulin aspart  3 Units Subcutaneous TID WC  . insulin detemir  7 Units Subcutaneous QHS  . ipratropium-albuterol  3 mL Nebulization QID  . metoprolol succinate  75 mg Oral QPC breakfast  . mometasone-formoterol  2 puff Inhalation BID  . [START ON 09/26/2013] predniSONE  10 mg Oral Q breakfast  . Rivaroxaban  15 mg Oral Q supper  . sodium chloride  3 mL Intravenous Q12H   Continuous Infusions:   Active Problems:   HTN (hypertension)  Atrial fibrillation   Long term (current) use of anticoagulants   Acute on chronic diastolic CHF (congestive heart failure)   COPD (chronic obstructive pulmonary disease)   Acute diastolic heart failure   Respiratory failure   Chronic kidney disease (CKD), stage III (moderate)  Time spent: 5  Verneita Griffes, MD [facility] [phone]   09/25/2013, 3:47 PM  LOS: 6 days

## 2013-09-25 NOTE — Progress Notes (Signed)
Endorsed to incoming rn. Paged Dr. Mahala MenghiniSamtani for update on change in rhythm. Still waiting for call back.

## 2013-09-25 NOTE — Progress Notes (Signed)
[id number] Dr Mahala MenghiniSamtani called .  Updated about pt . Aware of VS and  ekg conversion to afib . Ordered to give his daily  Dose of cardizem now

## 2013-09-26 LAB — GLUCOSE, CAPILLARY
GLUCOSE-CAPILLARY: 256 mg/dL — AB (ref 70–99)
GLUCOSE-CAPILLARY: 157 mg/dL — AB (ref 70–99)
GLUCOSE-CAPILLARY: 141 mg/dL — AB (ref 70–99)
Glucose-Capillary: 150 mg/dL — ABNORMAL HIGH (ref 70–99)
Glucose-Capillary: 117 mg/dL — ABNORMAL HIGH (ref 70–99)

## 2013-09-26 NOTE — Progress Notes (Signed)
PROGRESS NOTE  Mark [name] ZOX:[id number] DOB: Jan 24, 1946 DOA: 09/19/2013 PCP: Leo GrosserPICKARD,WARREN TOM, MD  HPI  68 y.o. ? known COPD [unclear stage], stage III chronic kidney disease, atrial fibrillation, diastolic congestive heart failure[Nuc stress 10/09/12=LVEF59%, Nl L wall motion] , AAA repair 10/15/12 c multiple hospitalizations recently. Admit on 09/08/2013 discharged on 09/12/2013 at which time he presented with acute on chronic diastolic congestive heart failure. creatinine peaked at 3.49 on 09/11/2013. ?  diuretics were held on d/c. Snce discharge he has had progressive dyspnea at rest, cough associated with white sputum production, wheezing, worsening of bilateral lower extremity edema, 6 pound weight gain in 1 week. He denies chest pain, fevers chills, nausea or vomiting, abdominal pain, syncope, presyncope, bloody stools. ED work-up= BNP of 6856. EKG showed atrial fibrillation. patient noted to be an respiratory distress and was administered supplemental oxygen via nonrebreather.  He was cardioverted with DC cardioversion on 09/23/13 but then reverted to A. fib at 115 to 120 range Cardiology managing currently plan is for Dc cardioversion 1/12?  Assessment/Plan: Acute on chronic diastolic congestive heart failure.  - worsening dyspnea at rest, 6 pound weight gain, increasing bilateral extremity pitting edema. - Is now -4.1 L since admission.  Discharge weight last hospital stay 154, current weight 156 and holding - diuresing per cardiology, Lasix IV twice a day 40 mg currently Atrial fibrillation, CHad2Vasc2 score=2-3. S/p direct cardioversion 150 J 09/22/13 -Cont amiodarone 200 mg by mouth twice a day, metoprolol 50 mg daily and Cardizem 300 mg by mouth daily. -Needs TSH, LFTs in 4-6 weeks as is on chronic amiodarone -Placed on Xarelto 15 mg daily due to renal fnxn. Multifactorial acute respiratory failure, -likely secondary to combined congestive heart failure and COPD  exacerbation. -Azithromycin 1/4-1/8 -Afebrile, leukocytosis 2/2 prednison  Potential pneumonia - CXR with left base opacification, Azithro discontinued 1/8 -1/9 repeat CXR =effusions  COPD, unclear as to stage -Placed on IV steroids on admission, rapid taper to off 09/25/13 Mild metabolic alkalosis -labs stable Stage III chronic kidney disease.  -Patient's baseline creatinine near 2.3, 2.4. He presently appears to be at his baseline.  -We'll need to monitor closely given the need for IV diuresis Mild-mod mitral regurgitation -monitor Hypertension - Continue Cardizem and metoprolol therapy. Nutrition.  Hyperlipidemia -continue atorvastatin 40 daily Hyperglycemia , steroid-induced -Continue diabetic diet -Worsened by steroid-induced hyperglycemia -Start metformin 500 twice daily on 09/24/13 -Insulin teaching started -Started Levemir 7 units---> 10 units, moderate sliding scale with 4 times a day coverage Normocytic anemia -Monitor as an outpatient as is on anticoagulation   Diet: Diabetic Fluids: none DVT Prophylaxis: Xarelto  Code Status: Full Family Communication: Discussed with sister at bedside Disposition Plan: inpatient  Consultants:  Cardiology  Procedures:  Cardioversion 1/7   Antibiotics - none  HPI/Subjective:  Feels fair.  Felt worse yesterday Doesn't like his clothing No CP or SOB No nausea no vomiting no chest pain Tolerating diet   Objective: Filed Vitals:   09/25/13 2100 09/26/13 0019 09/26/13 0500 09/26/13 0620  BP: 152/100 140/85 157/93 155/89  Pulse: 81  113 122  Temp: 97.9 F (36.6 C)  97.7 F (36.5 C)   TempSrc: Oral  Oral   Resp: 24  20   Height:      Weight:   70.9 kg (156 lb 4.9 oz)   SpO2:   97%     Intake/Output Summary (Last 24 hours) at 09/26/13 1321 Last data filed at 09/26/13 0900  Gross per 24 hour  Intake  820 ml  Output   2150 ml  Net  -1330 ml   Filed Weights   09/24/13 0547 09/25/13 0700 09/26/13 0500   Weight: 70.988 kg (156 lb 8 oz) 71.169 kg (156 lb 14.4 oz) 70.9 kg (156 lb 4.9 oz)    Exam:   General:  NAD, mildly dyspneic sitting at the edge of the bed  Cardiovascular: Regular normal sinus rhythm  Respiratory: no wheezing, bibasilar crackles.   Abdomen: soft, not tender to palpation, positive bowel sounds  MSK: no peripheral edema  Neuro: non focal  Data Reviewed: Basic Metabolic Panel:  Recent Labs Lab 09/22/13 0429 09/22/13 2025 09/23/13 0320 09/24/13 0245 09/25/13 0505  NA 139 141 142 140 144  K 5.7* 5.3 4.4 4.4 4.4  CL 93* 93* 96 93* 96  CO2 32 34* 33* 34* 35*  GLUCOSE 258* 402* 306* 273* 182*  BUN 62* 60* 59* 59* 61*  CREATININE 2.88* 2.90* 2.70* 2.45* 2.36*  CALCIUM 9.3 8.7 8.9 8.8 8.5  PHOS  --   --   --   --  3.0   Liver Function Tests:  Recent Labs Lab 09/25/13 0505  ALBUMIN 2.6*   CBC:  Recent Labs Lab 09/20/13 0420 09/22/13 0429 09/23/13 0320  WBC 10.3 13.6* 11.7*  HGB 9.9* 10.3* 10.4*  HCT 29.3* 31.7* 31.8*  MCV 94.8 96.6 96.1  PLT 129* 175 187   Cardiac Enzymes:  Recent Labs Lab 09/19/13 1545 09/19/13 2110  TROPONINI <0.30 <0.30   BNP (last 3 results)  Recent Labs  09/08/13 1032 09/19/13 0628  PROBNP 5402.0* 6856.0*   Studies: Dg Chest 2 View  09/25/2013   CLINICAL DATA:  Pneumonia, shortness of breath  EXAM: CHEST  2 VIEW  COMPARISON:  09/19/2013  FINDINGS: Stable small pleural effusions, slightly larger on the left. Left lower lobe retrocardiac streaky opacity persist without significant change compatible with pneumonia. Minor right base atelectasis. Upper lungs clear. Normal heart size and vascularity. No pneumothorax. Trachea is midline.  IMPRESSION: Small pleural effusions with persistent left base streaky airspace disease concerning for pneumonia   Electronically Signed   By: Ruel Favors M.D.   On: 09/25/2013 08:06   Scheduled Meds: . amiodarone  400 mg Oral BID  . atorvastatin  40 mg Oral q1800  . diltiazem   300 mg Oral Daily  . furosemide  40 mg Intravenous BID  . guaiFENesin  1,200 mg Oral BID  . insulin aspart  0-15 Units Subcutaneous TID WC  . insulin aspart  3 Units Subcutaneous TID WC  . insulin detemir  10 Units Subcutaneous QHS  . ipratropium-albuterol  3 mL Nebulization QID  . metoprolol succinate  75 mg Oral QPC breakfast  . mometasone-formoterol  2 puff Inhalation BID  . Rivaroxaban  15 mg Oral Q supper  . sodium chloride  3 mL Intravenous Q12H   Continuous Infusions:   Active Problems:   HTN (hypertension)   Atrial fibrillation   Long term (current) use of anticoagulants   Acute on chronic diastolic CHF (congestive heart failure)   COPD (chronic obstructive pulmonary disease)   Acute diastolic heart failure   Respiratory failure   Chronic kidney disease (CKD), stage III (moderate)  Time spent: 48  Pleas Koch, MD [facility] [phone]   09/26/2013, 1:21 PM  LOS: 7 days

## 2013-09-26 NOTE — Progress Notes (Signed)
Patient Name: Mark [name] Date of Encounter: 09/26/2013     Active Problems:   HTN (hypertension)   Atrial fibrillation   Long term (current) use of anticoagulants   Acute on chronic diastolic CHF (congestive heart failure)   COPD (chronic obstructive pulmonary disease)   Acute diastolic heart failure   Respiratory failure   Chronic kidney disease (CKD), stage III (moderate)    SUBJECTIVE  The patient remains in atrial fibrillation.  He still has a cough but is not producing any sputum.  He is on guaifenesin.  He is being loaded with amiodarone orally in anticipation of another attempt at direct-current cardioversion in early next week.  He is not having any chest pain  CURRENT MEDS . amiodarone  400 mg Oral BID  . atorvastatin  40 mg Oral q1800  . diltiazem  300 mg Oral Daily  . furosemide  40 mg Intravenous BID  . guaiFENesin  1,200 mg Oral BID  . insulin aspart  0-15 Units Subcutaneous TID WC  . insulin aspart  3 Units Subcutaneous TID WC  . insulin detemir  10 Units Subcutaneous QHS  . ipratropium-albuterol  3 mL Nebulization QID  . metoprolol succinate  75 mg Oral QPC breakfast  . mometasone-formoterol  2 puff Inhalation BID  . Rivaroxaban  15 mg Oral Q supper  . sodium chloride  3 mL Intravenous Q12H    OBJECTIVE  Filed Vitals:   09/25/13 2100 09/26/13 0019 09/26/13 0500 09/26/13 0620  BP: 152/100 140/85 157/93 155/89  Pulse: 81  113 122  Temp: 97.9 F (36.6 C)  97.7 F (36.5 C)   TempSrc: Oral  Oral   Resp: 24  20   Height:      Weight:   156 lb 4.9 oz (70.9 kg)   SpO2:   97%     Intake/Output Summary (Last 24 hours) at 09/26/13 1205 Last data filed at 09/26/13 0900  Gross per 24 hour  Intake   1060 ml  Output   2150 ml  Net  -1090 ml   Filed Weights   09/24/13 0547 09/25/13 0700 09/26/13 0500  Weight: 156 lb 8 oz (70.988 kg) 156 lb 14.4 oz (71.169 kg) 156 lb 4.9 oz (70.9 kg)    PHYSICAL EXAM  General: Pleasant, NAD. Neuro: Alert and  oriented X 3. Moves all extremities spontaneously. Psych: Normal affect. HEENT:  Normal  Neck: Supple without bruits or JVD. Lungs:  Patient has loud coarse inspiratory and expiratory rhonchi bilaterally worse on the left.  They do not clear with coughing. Heart: Irregularly irregular pulse.  No gallop Abdomen: Soft, non-tender, non-distended, BS + x 4.  Extremities: There is 1-2+ pretibial edema bilaterally  Accessory Clinical Findings  CBC No results found for this basename: WBC, NEUTROABS, HGB, HCT, MCV, PLT,  in the last 72 hours Basic Metabolic Panel  Recent Labs  09/24/13 0245 09/25/13 0505  NA 140 144  K 4.4 4.4  CL 93* 96  CO2 34* 35*  GLUCOSE 273* 182*  BUN 59* 61*  CREATININE 2.45* 2.36*  CALCIUM 8.8 8.5  PHOS  --  3.0   Liver Function Tests  Recent Labs  09/25/13 0505  ALBUMIN 2.6*   No results found for this basename: LIPASE, AMYLASE,  in the last 72 hours Cardiac Enzymes No results found for this basename: CKTOTAL, CKMB, CKMBINDEX, TROPONINI,  in the last 72 hours BNP No components found with this basename: POCBNP,  D-Dimer No results found for this  basename: DDIMER,  in the last 72 hours Hemoglobin A1C No results found for this basename: HGBA1C,  in the last 72 hours Fasting Lipid Panel No results found for this basename: CHOL, HDL, LDLCALC, TRIG, CHOLHDL, LDLDIRECT,  in the last 72 hours Thyroid Function Tests No results found for this basename: TSH, T4TOTAL, FREET3, T3FREE, THYROIDAB,  in the last 72 hours  TELE  Atrial fibrillation with controlled ventricular response  ECG    Radiology/Studies  Dg Chest 2 View  09/25/2013   CLINICAL DATA:  Pneumonia, shortness of breath  EXAM: CHEST  2 VIEW  COMPARISON:  09/19/2013  FINDINGS: Stable small pleural effusions, slightly larger on the left. Left lower lobe retrocardiac streaky opacity persist without significant change compatible with pneumonia. Minor right base atelectasis. Upper lungs clear.  Normal heart size and vascularity. No pneumothorax. Trachea is midline.  IMPRESSION: Small pleural effusions with persistent left base streaky airspace disease concerning for pneumonia   Electronically Signed   By: Ruel Favors M.D.   On: 09/25/2013 08:06   Dg Chest 2 View  09/10/2013   CLINICAL DATA:  Short of breath.  EXAM: CHEST  2 VIEW  COMPARISON:  09/08/2013  FINDINGS: Lungs are hyperexpanded. There are small bilateral pleural effusions, mildly decreased on the left since the prior study, with mild associated lung base reticular opacity, likely subsegmental atelectasis. There is no pulmonary edema. There is no convincing infiltrate.  Cardiac silhouette is normal in size. The mediastinum is normal in contour.  No pneumothorax.  IMPRESSION: Mild decrease in the small left pleural effusions since the prior study. No other change. No pulmonary edema is evident. Lung hyperexpansion suggests COPD.   Electronically Signed   By: Amie Portland M.D.   On: 09/10/2013 08:20   Dg Chest 2 View  09/08/2013   CLINICAL DATA:  Dyspnea and weakness and recent pneumonia  EXAM: CHEST  2 VIEW  COMPARISON:  Chest x-ray dated August 14, 2013.  FINDINGS: The lungs are well-expanded. The pleural effusion on the left is increased slightly in size. Pleural fluid has developed on the right as well. There are coarse lung markings in the retrocardiac region on the lateral film data lobe may reflect a bibasilar pneumonia. The cardiopericardial silhouette is normal in size. The pulmonary vascularity is not engorged. The mediastinum is normal in width. The observed portions of the bony thorax exhibit no acute abnormalities.  IMPRESSION: The findings are worrisome for bibasilar pneumonia with small but increasing bilateral pleural effusions. There is no evidence of CHF.   Electronically Signed   By: David  [place]   On: 09/08/2013 11:06   Dg Chest Portable 1 View  09/19/2013   CLINICAL DATA:  Shortness of breath  EXAM: PORTABLE  CHEST - 1 VIEW  COMPARISON:  09/10/2013  FINDINGS: Small bilateral pleural effusions, left more than right. The volume may have mildly increased from before. There is also increased lung density at the left base. Reticular opacities at the right base appears similar to prior. Normal heart size.  IMPRESSION: 1. Increased lung opacification at the left base, which could represent pneumonia or pneumonitis. 2. Small bilateral pleural effusions.   Electronically Signed   By: Tiburcio Pea M.D.   On: 09/19/2013 06:53    ASSESSMENT AND PLAN HTN (hypertension)  Atrial fibrillation, recurrent.  Long term (current) use of anticoagulants  Acute on chronic diastolic CHF (congestive heart failure)  COPD (chronic obstructive pulmonary disease)  Acute diastolic heart failure  Respiratory failure  Chronic  kidney disease (CKD), stage III (moderate)  Plan: Continue amiodarone loading.  Anticipate direct-current cardioversion Monday or Tuesday.  Continue pulmonary toilet for chest congestion.  Signed, Cassell Clementhomas Aran Menning MD

## 2013-09-27 LAB — PROTIME-INR
INR: 1.16 (ref 0.00–1.49)
PROTHROMBIN TIME: 14.6 s (ref 11.6–15.2)

## 2013-09-27 LAB — BASIC METABOLIC PANEL
BUN: 63 mg/dL — ABNORMAL HIGH (ref 6–23)
CALCIUM: 9.5 mg/dL (ref 8.4–10.5)
CO2: 42 mEq/L (ref 19–32)
Chloride: 93 mEq/L — ABNORMAL LOW (ref 96–112)
Creatinine, Ser: 2.36 mg/dL — ABNORMAL HIGH (ref 0.50–1.35)
GFR calc non Af Amer: 26 mL/min — ABNORMAL LOW (ref >90)
GFR, EST AFRICAN AMERICAN: 31 mL/min — AB (ref >90)
Glucose, Bld: 104 mg/dL — ABNORMAL HIGH (ref 70–99)
Potassium: 5.4 mEq/L — ABNORMAL HIGH (ref 3.7–5.3)
Sodium: 141 mEq/L (ref 137–147)

## 2013-09-27 LAB — GLUCOSE, CAPILLARY
Glucose-Capillary: 108 mg/dL — ABNORMAL HIGH (ref 70–99)
Glucose-Capillary: 176 mg/dL — ABNORMAL HIGH (ref 70–99)
Glucose-Capillary: 116 mg/dL — ABNORMAL HIGH (ref 70–99)
Glucose-Capillary: 149 mg/dL — ABNORMAL HIGH (ref 70–99)

## 2013-09-27 LAB — BLOOD GAS, ARTERIAL
Acid-Base Excess: 10.3 mmol/L — ABNORMAL HIGH (ref 0.0–2.0)
BICARBONATE: 35.1 meq/L — AB (ref 20.0–24.0)
Drawn by: 31101
O2 Content: 2 L/min
O2 SAT: 98.5 %
PATIENT TEMPERATURE: 98.6
TCO2: 36.8 mmol/L (ref 0–100)
pCO2 arterial: 54.3 mmHg — ABNORMAL HIGH (ref 35.0–45.0)
pH, Arterial: 7.426 (ref 7.350–7.450)
pO2, Arterial: 110 mmHg — ABNORMAL HIGH (ref 80.0–100.0)

## 2013-09-27 LAB — RENAL FUNCTION PANEL
ALBUMIN: 2.6 g/dL — AB (ref 3.5–5.2)
BUN: 63 mg/dL — AB (ref 6–23)
CO2: 38 mEq/L — ABNORMAL HIGH (ref 19–32)
Calcium: 8.3 mg/dL — ABNORMAL LOW (ref 8.4–10.5)
Chloride: 94 mEq/L — ABNORMAL LOW (ref 96–112)
Creatinine, Ser: 2.34 mg/dL — ABNORMAL HIGH (ref 0.50–1.35)
GFR calc Af Amer: 31 mL/min — ABNORMAL LOW (ref >90)
GFR calc non Af Amer: 27 mL/min — ABNORMAL LOW (ref >90)
Glucose, Bld: 139 mg/dL — ABNORMAL HIGH (ref 70–99)
PHOSPHORUS: 3 mg/dL (ref 2.3–4.6)
POTASSIUM: 4.7 meq/L (ref 3.7–5.3)
Sodium: 141 mEq/L (ref 137–147)

## 2013-09-27 LAB — APTT: aPTT: 31 seconds (ref 24–37)

## 2013-09-27 LAB — MAGNESIUM: Magnesium: 2.1 mg/dL (ref 1.5–2.5)

## 2013-09-27 MED ORDER — SODIUM CHLORIDE 0.9 % IJ SOLN
3.0000 mL | Freq: Two times a day (BID) | INTRAMUSCULAR | Status: DC
  Administered 2013-09-27 (×2): 3 mL via INTRAVENOUS

## 2013-09-27 MED ORDER — SODIUM POLYSTYRENE SULFONATE 15 GM/60ML PO SUSP
15.0000 g | ORAL | Status: AC
  Administered 2013-09-27: 20:00:00 15 g via ORAL
  Filled 2013-09-27: qty 60

## 2013-09-27 MED ORDER — SODIUM CHLORIDE 0.9 % IJ SOLN: 3.0000 mL | INTRAMUSCULAR | Status: DC | PRN

## 2013-09-27 MED ORDER — SODIUM CHLORIDE 0.9 % IV SOLN: 250.0000 mL | INTRAVENOUS | Status: DC

## 2013-09-27 MED ORDER — SODIUM POLYSTYRENE SULFONATE 15 GM/60ML PO SUSP: 15.0000 g | Freq: Once | ORAL | Status: DC

## 2013-09-27 MED ORDER — FUROSEMIDE 40 MG PO TABS
40.0000 mg | ORAL_TABLET | Freq: Two times a day (BID) | ORAL | Status: DC
  Administered 2013-09-27: 40 mg via ORAL
  Filled 2013-09-27 (×2): qty 1

## 2013-09-27 MED ORDER — IPRATROPIUM-ALBUTEROL 0.5-2.5 (3) MG/3ML IN SOLN
3.0000 mL | Freq: Three times a day (TID) | RESPIRATORY_TRACT | Status: DC
  Administered 2013-09-27 – 2013-09-29 (×7): 3 mL via RESPIRATORY_TRACT
  Filled 2013-09-27 (×7): qty 3

## 2013-09-27 NOTE — Progress Notes (Signed)
PROGRESS NOTE  Mark [name] ZOX:[id number] DOB: 31-Aug-1946 DOA: 09/19/2013 PCP: Leo GrosserPICKARD,WARREN TOM, MD  HPI  68 y.o. ? known COPD [unclear stage], stage III chronic kidney disease, atrial fibrillation, diastolic congestive heart failure[Nuc stress 10/09/12=LVEF59%, Nl L wall motion] , AAA repair 10/15/12 c multiple hospitalizations recently. Admit on 09/08/2013 discharged on 09/12/2013 at which time he presented with acute on chronic diastolic congestive heart failure. creatinine peaked at 3.49 on 09/11/2013. ?  diuretics were held on d/c. Snce discharge he has had progressive dyspnea at rest, cough associated with white sputum production, wheezing, worsening of bilateral lower extremity edema, 6 pound weight gain in 1 week. He denies chest pain, fevers chills, nausea or vomiting, abdominal pain, syncope, presyncope, bloody stools. ED work-up= BNP of 6856. EKG showed atrial fibrillation. patient noted to be an respiratory distress and was administered supplemental oxygen via nonrebreather.  He was cardioverted with DC cardioversion on 09/23/13 but then reverted to A. fib at 115 to 120 range Cardiology managing currently plan is for Dc cardioversion 1/12  Assessment/Plan: Acute on chronic diastolic congestive heart failure.  - worsening dyspnea at rest, 6 pound weight gain, increasing bilateral extremity pitting edema. - Is now -6.8 L since admission.  Discharge weight last hospital stay 154, current weight 153  - diuresing per cardiology -IV Lasix transitioned to by mouth 1/11 Atrial fibrillation, CHad2Vasc2 score=2-3. S/p direct cardioversion 150 J 09/22/13 -Cont amiodarone 200 mg by mouth twice a day, metoprolol 50 mg daily and Cardizem 300 mg by mouth daily. -Needs TSH, LFTs in 4-6 weeks as is on chronic amiodarone -Placed on Xarelto 15 mg daily due to renal fnxn. -Cardioversion attempt again to a.m. 1/12-appreciate cardiology assistance -N.p.o. After midnight Multifactorial acute  respiratory failure, -likely secondary to combined congestive heart failure and COPD exacerbation. -Azithromycin 1/4-1/8 -Afebrile, leukocytosis 2/2 prednison  Potential pneumonia -CXR with left base opacification, Azithro discontinued 1/8 -1/9 repeat CXR =effusions  COPD, unclear as to stage -Placed on IV steroids on admission, rapid taper to off 09/25/13 Mild metabolic alkalosis -labs stable Stage III chronic kidney disease.  -Patient's baseline creatinine near 2.3, 2.4. He presently appears to be at his baseline.  -We'll need to monitor closely given the need for IV diuresis -Lasix transitioned 1/11 -Renal panel a.m. Mild-mod mitral regurgitation -monitor Hypertension - Continue Cardizem and metoprolol therapy. Nutrition.  Hyperlipidemia -continue atorvastatin 40 daily Hyperglycemia , steroid-induced -Continue diabetic diet -Worsened by steroid-induced hyperglycemia -Start metformin 500 twice daily on 09/24/13 -Insulin teaching started -Started Levemir 7 units---> 10 units, moderate sliding scale with 4 times a day coverage -sugar ranging between 108 and 176 -Eating 100% of meals Normocytic anemia -Monitor as an outpatient as is on anticoagulation -repeat CBC a.m.   Diet: Diabetic Fluids: none DVT Prophylaxis: Xarelto  Code Status: Full Family Communication: Discussed with sister at bedside Disposition Plan: inpatient  Consultants:  Cardiology  Procedures:  Cardioversion 1/7   Antibiotics - none  HPI/Subjective:  Feels fair.  Felt worse yesterday Doesn't like his clothing No CP or SOB No nausea no vomiting no chest pain Tolerating diet   Objective: Filed Vitals:   09/26/13 2008 09/26/13 2018 09/27/13 0544 09/27/13 1145  BP:  114/78 147/89   Pulse:  83 110   Temp:  97.8 F (36.6 C) 97.4 F (36.3 C)   TempSrc:  Oral Oral   Resp:  20 20   Height:      Weight:   69.8 kg (153 lb 14.1 oz)   SpO2: 95%  97% 98% 87%    Intake/Output Summary (Last 24  hours) at 09/27/13 1204 Last data filed at 09/27/13 1113  Gross per 24 hour  Intake   1078 ml  Output   3850 ml  Net  -2772 ml   Filed Weights   09/25/13 0700 09/26/13 0500 09/27/13 0544  Weight: 71.169 kg (156 lb 14.4 oz) 70.9 kg (156 lb 4.9 oz) 69.8 kg (153 lb 14.1 oz)    Exam:   General:  NAD, mildly dyspneic sitting at the edge of the bed  Cardiovascular: Regular normal sinus rhythm  Respiratory: no wheezing, bibasilar crackles.   Abdomen: soft, not tender to palpation, positive bowel sounds  MSK: no peripheral edema  Neuro: non focal  Data Reviewed: Basic Metabolic Panel:  Recent Labs Lab 09/22/13 2025 09/23/13 0320 09/24/13 0245 09/25/13 0505 09/27/13 0413  NA 141 142 140 144 141  K 5.3 4.4 4.4 4.4 4.7  CL 93* 96 93* 96 94*  CO2 34* 33* 34* 35* 38*  GLUCOSE 402* 306* 273* 182* 139*  BUN 60* 59* 59* 61* 63*  CREATININE 2.90* 2.70* 2.45* 2.36* 2.34*  CALCIUM 8.7 8.9 8.8 8.5 8.3*  PHOS  --   --   --  3.0 3.0   Liver Function Tests:  Recent Labs Lab 09/25/13 0505 09/27/13 0413  ALBUMIN 2.6* 2.6*   CBC:  Recent Labs Lab 09/22/13 0429 09/23/13 0320  WBC 13.6* 11.7*  HGB 10.3* 10.4*  HCT 31.7* 31.8*  MCV 96.6 96.1  PLT 175 187   Cardiac Enzymes: No results found for this basename: CKTOTAL, CKMB, CKMBINDEX, TROPONINI,  in the last 168 hours BNP (last 3 results)  Recent Labs  09/08/13 1032 09/19/13 0628  PROBNP 5402.0* 6856.0*   Studies: No results found. Scheduled Meds: . amiodarone  400 mg Oral BID  . atorvastatin  40 mg Oral q1800  . diltiazem  300 mg Oral Daily  . furosemide  40 mg Intravenous BID  . guaiFENesin  1,200 mg Oral BID  . insulin aspart  0-15 Units Subcutaneous TID WC  . insulin aspart  3 Units Subcutaneous TID WC  . insulin detemir  10 Units Subcutaneous QHS  . ipratropium-albuterol  3 mL Nebulization TID  . metoprolol succinate  75 mg Oral QPC breakfast  . mometasone-formoterol  2 puff Inhalation BID  .  Rivaroxaban  15 mg Oral Q supper  . sodium chloride  3 mL Intravenous Q12H   Continuous Infusions:   Active Problems:   HTN (hypertension)   Atrial fibrillation   Long term (current) use of anticoagulants   Acute on chronic diastolic CHF (congestive heart failure)   COPD (chronic obstructive pulmonary disease)   Acute diastolic heart failure   Respiratory failure   Chronic kidney disease (CKD), stage III (moderate)  Time spent: 65  Pleas Koch, MD [facility] [phone]   09/27/2013, 12:04 PM  LOS: 8 days

## 2013-09-27 NOTE — Progress Notes (Signed)
Patient Name: Mark [name] Date of Encounter: 09/27/2013     Active Problems:   HTN (hypertension)   Atrial fibrillation   Long term (current) use of anticoagulants   Acute on chronic diastolic CHF (congestive heart failure)   COPD (chronic obstructive pulmonary disease)   Acute diastolic heart failure   Respiratory failure   Chronic kidney disease (CKD), stage III (moderate)    SUBJECTIVE  The patient remains in atrial fibrillation with controlled ventricular response.  He has lost 3 pounds overnight.  Renal function is elevated but stable on current diuretic therapy  CURRENT MEDS . amiodarone  400 mg Oral BID  . atorvastatin  40 mg Oral q1800  . diltiazem  300 mg Oral Daily  . furosemide  40 mg Intravenous BID  . guaiFENesin  1,200 mg Oral BID  . insulin aspart  0-15 Units Subcutaneous TID WC  . insulin aspart  3 Units Subcutaneous TID WC  . insulin detemir  10 Units Subcutaneous QHS  . ipratropium-albuterol  3 mL Nebulization TID  . metoprolol succinate  75 mg Oral QPC breakfast  . mometasone-formoterol  2 puff Inhalation BID  . Rivaroxaban  15 mg Oral Q supper  . sodium chloride  3 mL Intravenous Q12H    OBJECTIVE  Filed Vitals:   09/26/13 2008 09/26/13 2018 09/27/13 0544 09/27/13 1145  BP:  114/78 147/89   Pulse:  83 110   Temp:  97.8 F (36.6 C) 97.4 F (36.3 C)   TempSrc:  Oral Oral   Resp:  20 20   Height:      Weight:   153 lb 14.1 oz (69.8 kg)   SpO2: 95% 97% 98% 87%    Intake/Output Summary (Last 24 hours) at 09/27/13 1152 Last data filed at 09/27/13 1113  Gross per 24 hour  Intake   1078 ml  Output   3850 ml  Net  -2772 ml   Filed Weights   09/25/13 0700 09/26/13 0500 09/27/13 0544  Weight: 156 lb 14.4 oz (71.169 kg) 156 lb 4.9 oz (70.9 kg) 153 lb 14.1 oz (69.8 kg)    PHYSICAL EXAM  General: Pleasant, NAD. Neuro: Alert and oriented X 3. Moves all extremities spontaneously. Psych: Normal affect. HEENT:  Normal  Neck: Supple  without bruits or JVD. Lungs:  Mild inspiratory rales.  He is receiving a breathing treatment at the present time. Heart: Irregularly irregular rhythm. Abdomen: Soft, non-tender, non-distended, BS + x 4.  Extremities: Mild peripheral edema.Marland Kitchen. DP/PT/Radials 2+ and equal bilaterally.  Accessory Clinical Findings  CBC No results found for this basename: WBC, NEUTROABS, HGB, HCT, MCV, PLT,  in the last 72 hours Basic Metabolic Panel  Recent Labs  09/25/13 0505 09/27/13 0413  NA 144 141  K 4.4 4.7  CL 96 94*  CO2 35* 38*  GLUCOSE 182* 139*  BUN 61* 63*  CREATININE 2.36* 2.34*  CALCIUM 8.5 8.3*  PHOS 3.0 3.0   Liver Function Tests  Recent Labs  09/25/13 0505 09/27/13 0413  ALBUMIN 2.6* 2.6*   No results found for this basename: LIPASE, AMYLASE,  in the last 72 hours Cardiac Enzymes No results found for this basename: CKTOTAL, CKMB, CKMBINDEX, TROPONINI,  in the last 72 hours BNP No components found with this basename: POCBNP,  D-Dimer No results found for this basename: DDIMER,  in the last 72 hours Hemoglobin A1C No results found for this basename: HGBA1C,  in the last 72 hours Fasting Lipid Panel No results found  for this basename: CHOL, HDL, LDLCALC, TRIG, CHOLHDL, LDLDIRECT,  in the last 72 hours Thyroid Function Tests No results found for this basename: TSH, T4TOTAL, FREET3, T3FREE, THYROIDAB,  in the last 72 hours  TELE  Atrial fibrillation with controlled ventricular response  ECG    Radiology/Studies  Dg Chest 2 View  09/25/2013   CLINICAL DATA:  Pneumonia, shortness of breath  EXAM: CHEST  2 VIEW  COMPARISON:  09/19/2013  FINDINGS: Stable small pleural effusions, slightly larger on the left. Left lower lobe retrocardiac streaky opacity persist without significant change compatible with pneumonia. Minor right base atelectasis. Upper lungs clear. Normal heart size and vascularity. No pneumothorax. Trachea is midline.  IMPRESSION: Small pleural effusions with  persistent left base streaky airspace disease concerning for pneumonia   Electronically Signed   By: Ruel Favors M.D.   On: 09/25/2013 08:06   Dg Chest 2 View  09/10/2013   CLINICAL DATA:  Short of breath.  EXAM: CHEST  2 VIEW  COMPARISON:  09/08/2013  FINDINGS: Lungs are hyperexpanded. There are small bilateral pleural effusions, mildly decreased on the left since the prior study, with mild associated lung base reticular opacity, likely subsegmental atelectasis. There is no pulmonary edema. There is no convincing infiltrate.  Cardiac silhouette is normal in size. The mediastinum is normal in contour.  No pneumothorax.  IMPRESSION: Mild decrease in the small left pleural effusions since the prior study. No other change. No pulmonary edema is evident. Lung hyperexpansion suggests COPD.   Electronically Signed   By: Amie Portland M.D.   On: 09/10/2013 08:20   Dg Chest 2 View  09/08/2013   CLINICAL DATA:  Dyspnea and weakness and recent pneumonia  EXAM: CHEST  2 VIEW  COMPARISON:  Chest x-ray dated August 14, 2013.  FINDINGS: The lungs are well-expanded. The pleural effusion on the left is increased slightly in size. Pleural fluid has developed on the right as well. There are coarse lung markings in the retrocardiac region on the lateral film data lobe may reflect a bibasilar pneumonia. The cardiopericardial silhouette is normal in size. The pulmonary vascularity is not engorged. The mediastinum is normal in width. The observed portions of the bony thorax exhibit no acute abnormalities.  IMPRESSION: The findings are worrisome for bibasilar pneumonia with small but increasing bilateral pleural effusions. There is no evidence of CHF.   Electronically Signed   By: David  [place]   On: 09/08/2013 11:06   Dg Chest Portable 1 View  09/19/2013   CLINICAL DATA:  Shortness of breath  EXAM: PORTABLE CHEST - 1 VIEW  COMPARISON:  09/10/2013  FINDINGS: Small bilateral pleural effusions, left more than right. The volume  may have mildly increased from before. There is also increased lung density at the left base. Reticular opacities at the right base appears similar to prior. Normal heart size.  IMPRESSION: 1. Increased lung opacification at the left base, which could represent pneumonia or pneumonitis. 2. Small bilateral pleural effusions.   Electronically Signed   By: Tiburcio Pea M.D.   On: 09/19/2013 06:53    ASSESSMENT AND PLAN  HTN (hypertension)  Atrial fibrillation, recurrent.  Long term (current) use of anticoagulants  Acute on chronic diastolic CHF (congestive heart failure)  COPD (chronic obstructive pulmonary disease)  Acute diastolic heart failure  Respiratory failure  Chronic kidney disease (CKD), stage III (moderate)  Plan: We will attempt to schedule direct-current cardioversion in his room for Monday.  Will keep n.p.o. tonight after midnight  Signed, Darlin Coco MD

## 2013-09-28 ENCOUNTER — Encounter (HOSPITAL_COMMUNITY)
Admission: EM | Disposition: A | Discharge status: Home Health | Payer: Self-pay | Source: Home / Self Care | Attending: Family Medicine

## 2013-09-28 DIAGNOSIS — I4891 Unspecified atrial fibrillation: Secondary | ICD-10-CM

## 2013-09-28 DIAGNOSIS — I70219 Atherosclerosis of native arteries of extremities with intermittent claudication, unspecified extremity: Secondary | ICD-10-CM

## 2013-09-28 HISTORY — PX: CARDIOVERSION: SHX1299

## 2013-09-28 LAB — GLUCOSE, CAPILLARY
Glucose-Capillary: 101 mg/dL — ABNORMAL HIGH (ref 70–99)
Glucose-Capillary: 89 mg/dL (ref 70–99)
Glucose-Capillary: 345 mg/dL — ABNORMAL HIGH (ref 70–99)

## 2013-09-28 LAB — RENAL FUNCTION PANEL
Albumin: 2.5 g/dL — ABNORMAL LOW (ref 3.5–5.2)
BUN: 59 mg/dL — AB (ref 6–23)
CO2: 38 mEq/L — ABNORMAL HIGH (ref 19–32)
Calcium: 8.7 mg/dL (ref 8.4–10.5)
Chloride: 93 mEq/L — ABNORMAL LOW (ref 96–112)
Creatinine, Ser: 2.27 mg/dL — ABNORMAL HIGH (ref 0.50–1.35)
GFR calc Af Amer: 32 mL/min — ABNORMAL LOW (ref >90)
GFR calc non Af Amer: 28 mL/min — ABNORMAL LOW (ref >90)
GLUCOSE: 122 mg/dL — AB (ref 70–99)
POTASSIUM: 3.9 meq/L (ref 3.7–5.3)
Phosphorus: 3.7 mg/dL (ref 2.3–4.6)
Sodium: 140 mEq/L (ref 137–147)

## 2013-09-28 LAB — CBC
HCT: 30.3 % — ABNORMAL LOW (ref 39.0–52.0)
Hemoglobin: 10.2 g/dL — ABNORMAL LOW (ref 13.0–17.0)
MCH: 31.7 pg (ref 26.0–34.0)
MCHC: 33.7 g/dL (ref 30.0–36.0)
MCV: 94.1 fL (ref 78.0–100.0)
Platelets: 186 10*3/uL (ref 150–400)
RBC: 3.22 MIL/uL — ABNORMAL LOW (ref 4.22–5.81)
RDW: 14 % (ref 11.5–15.5)
WBC: 11.2 10*3/uL — AB (ref 4.0–10.5)

## 2013-09-28 SURGERY — CARDIOVERSION
Anesthesia: LOCAL

## 2013-09-28 MED ORDER — METOPROLOL TARTRATE 1 MG/ML IV SOLN
INTRAVENOUS | Status: AC
  Filled 2013-09-28: qty 5

## 2013-09-28 MED ORDER — SODIUM CHLORIDE 0.9 % IJ SOLN
3.0000 mL | Freq: Two times a day (BID) | INTRAMUSCULAR | Status: DC
  Administered 2013-09-28 – 2013-09-29 (×2): 3 mL via INTRAVENOUS

## 2013-09-28 MED ORDER — ONDANSETRON HCL 4 MG/2ML IJ SOLN: 4.0000 mg | Freq: Four times a day (QID) | INTRAMUSCULAR | Status: DC | PRN

## 2013-09-28 MED ORDER — MIDAZOLAM HCL 2 MG/2ML IJ SOLN
INTRAMUSCULAR | Status: AC
  Filled 2013-09-28: qty 2

## 2013-09-28 MED ORDER — FENTANYL CITRATE 0.05 MG/ML IJ SOLN
INTRAMUSCULAR | Status: AC
  Filled 2013-09-28: qty 2

## 2013-09-28 MED ORDER — MIDAZOLAM HCL 2 MG/2ML IJ SOLN
INTRAMUSCULAR | Status: AC
Start: 2013-09-28 — End: 2013-09-28
  Filled 2013-09-28: qty 2

## 2013-09-28 MED ORDER — LINAGLIPTIN 5 MG PO TABS
5.0000 mg | ORAL_TABLET | Freq: Every day | ORAL | Status: DC
Start: 2013-09-28 — End: 2013-09-29
  Administered 2013-09-28 – 2013-09-29 (×2): 5 mg via ORAL
  Filled 2013-09-28 (×2): qty 1

## 2013-09-28 MED ORDER — ACETAMINOPHEN 325 MG PO TABS: 650.0000 mg | ORAL_TABLET | ORAL | Status: DC | PRN

## 2013-09-28 MED ORDER — SODIUM CHLORIDE 0.9 % IV SOLN: 250.0000 mL | INTRAVENOUS | Status: DC | PRN

## 2013-09-28 MED ORDER — SODIUM CHLORIDE 0.9 % IJ SOLN: 3.0000 mL | INTRAMUSCULAR | Status: DC | PRN

## 2013-09-28 NOTE — Progress Notes (Signed)
PROGRESS NOTE  Hetty BlendHorace T Mwangi NWG:[id number] DOB: Mar 03, 1946 DOA: 09/19/2013 PCP: Leo GrosserPICKARD,WARREN TOM, MD  HPI  68 y.o. ? known COPD [unclear stage], stage III chronic kidney disease, atrial fibrillation, diastolic congestive heart failure[Nuc stress 10/09/12=[id number]%, Nl L wall motion] , AAA repair 10/15/12 c multiple hospitalizations recently. Admit on 09/08/2013 discharged on 09/12/2013 at which time he presented with acute on chronic diastolic congestive heart failure. creatinine peaked at 3.49 on 09/11/2013. ?  diuretics were held on d/c. Snce discharge he has had progressive dyspnea at rest, cough associated with white sputum production, wheezing, worsening of bilateral lower extremity edema, 6 pound weight gain in 1 week. He denies chest pain, fevers chills, nausea or vomiting, abdominal pain, syncope, presyncope, bloody stools. ED work-up= BNP of 6856. EKG showed atrial fibrillation. patient noted to be an respiratory distress and was administered supplemental oxygen via nonrebreather.  He was cardioverted with DC cardioversion on 09/23/13 but then reverted to A. fib at 115 to 120 range Cardiology managing currently plan is for Dc cardioversion 1/12  Assessment/Plan: Acute on chronic diastolic congestive heart failure.  - worsening dyspnea at rest, 6 pound weight gain, increasing bilateral extremity pitting edema. - Is now -7.4 L since admission.  Discharge weight last hospital stay 154, current weight 153  - diuresing per cardiology -IV Lasix transitioned to by mouth 1/11 Atrial fibrillation, CHad2Vasc2 score=2-3. S/p direct cardioversion 150 J 09/22/13 -Cont amiodarone 200 mg by mouth twice a day, metoprolol 50 mg daily and Cardizem 300 mg by mouth daily. -Needs TSH, LFTs in 4-6 weeks as is on chronic amiodarone -Placed on Xarelto 15 mg daily due to renal fnxn. -Cardioversion attempt again to a.m. 1/12-appreciate cardiology assistance -N.p.o. After midnight Multifactorial acute  respiratory failure, -likely secondary to combined congestive heart failure and COPD exacerbation. -Azithromycin 1/4-1/8 -Afebrile, leukocytosis 2/2 prednison  Potential pneumonia -CXR with left base opacification, Azithro discontinued 1/8 -1/9 repeat CXR =effusions  Chronic COPD, unclear as to GOLD stage -Placed on IV steroids on admission, rapid taper to off 09/25/13 Mild metabolic alkalosis -labs stable Stage III chronic kidney disease.  -Patient's baseline creatinine near 2.3, 2.4. He presently appears to be at his baseline.  -We'll need to monitor closely given the need for IV diuresis -Lasix transitioned 1/11 -Renal panel a.m. Mild-mod mitral regurgitation -monitor Hypertension - Continue Cardizem and metoprolol therapy. Nutrition.  Hyperlipidemia -continue atorvastatin 40 daily Hyperglycemia , steroid-induced-Would NOT term as DM right now -Continue diabetic diet -Worsened by steroid-induced hyperglycemia -Start metformin 500 twice daily on 09/24/13 -Started Levemir 7 units---> 10 units,  -sugar ranging between 108 and 176 -Eating 100% of meals -discontinue sliding scale but still check CBGs for now -will need extensive teaching, appreciate dietitian input Normocytic anemia -Monitor as an outpatient as is on anticoagulation -repeat CBC a.m.   Diet: Diabetic Fluids: none DVT Prophylaxis: Xarelto  Code Status: Full Family Communication: Discussed with sister at bedside Disposition Plan: inpatient  Consultants:  Cardiology  Procedures:  Cardioversion 1/7   Antibiotics - none  HPI/Subjective:  Upset that he's been called diabetic Tolerating n.p.o. Status fairly well at present Awaiting cardioversion  Objective: Filed Vitals:   09/27/13 1908 09/28/13 0500 09/28/13 0743 09/28/13 0945  BP: 131/83 123/77  144/91  Pulse: 83 80  110  Temp: 97.6 F (36.4 C) 98.2 F (36.8 C)  98 F (36.7 C)  TempSrc: Oral Oral  Oral  Resp: 20 20  20   Height:        Weight:  68.221 kg (  150 lb 6.4 oz)    SpO2: 93% 93% 95% 94%    Intake/Output Summary (Last 24 hours) at 09/28/13 1122 Last data filed at 09/28/13 1610  Gross per 24 hour  Intake    480 ml  Output   1100 ml  Net   -620 ml   Filed Weights   09/26/13 0500 09/27/13 0544 09/28/13 0500  Weight: 70.9 kg (156 lb 4.9 oz) 69.8 kg (153 lb 14.1 oz) 68.221 kg (150 lb 6.4 oz)    Exam:   General:  NAD, decreased dyspneic sitting at the edge of the bed  Cardiovascular: Regular normal sinus rhythm  Respiratory: no wheezing, bibasilar crackles.   Abdomen: soft, not tender to palpation, positive bowel sounds  MSK: minimal peripheral edema  Neuro: non focal  Data Reviewed: Basic Metabolic Panel:  Recent Labs Lab 09/24/13 0245 09/25/13 0505 09/27/13 0413 09/27/13 1703 09/28/13 0500  NA 140 144 141 141 140  K 4.4 4.4 4.7 5.4* 3.9  CL 93* 96 94* 93* 93*  CO2 34* 35* 38* 42* 38*  GLUCOSE 273* 182* 139* 104* 122*  BUN 59* 61* 63* 63* 59*  CREATININE 2.45* 2.36* 2.34* 2.36* 2.27*  CALCIUM 8.8 8.5 8.3* 9.5 8.7  MG  --   --   --  2.1  --   PHOS  --  3.0 3.0  --  3.7   Liver Function Tests:  Recent Labs Lab 09/25/13 0505 09/27/13 0413 09/28/13 0500  ALBUMIN 2.6* 2.6* 2.5*   CBC:  Recent Labs Lab 09/22/13 0429 09/23/13 0320 09/28/13 0500  WBC 13.6* 11.7* 11.2*  HGB 10.3* 10.4* 10.2*  HCT 31.7* 31.8* 30.3*  MCV 96.6 96.1 94.1  PLT 175 187 186   Cardiac Enzymes: No results found for this basename: CKTOTAL, CKMB, CKMBINDEX, TROPONINI,  in the last 168 hours BNP (last 3 results)  Recent Labs  09/08/13 1032 09/19/13 0628  PROBNP 5402.0* 6856.0*   Studies: No results found. Scheduled Meds: . amiodarone  400 mg Oral BID  . atorvastatin  40 mg Oral q1800  . diltiazem  300 mg Oral Daily  . guaiFENesin  1,200 mg Oral BID  . insulin aspart  0-15 Units Subcutaneous TID WC  . insulin aspart  3 Units Subcutaneous TID WC  . insulin detemir  10 Units Subcutaneous QHS   . ipratropium-albuterol  3 mL Nebulization TID  . linagliptin  5 mg Oral Daily  . metoprolol succinate  75 mg Oral QPC breakfast  . mometasone-formoterol  2 puff Inhalation BID  . Rivaroxaban  15 mg Oral Q supper  . sodium chloride  3 mL Intravenous Q12H  . sodium chloride  3 mL Intravenous Q12H   Continuous Infusions: . sodium chloride      Active Problems:   HTN (hypertension)   Atrial fibrillation   Long term (current) use of anticoagulants   Acute on chronic diastolic CHF (congestive heart failure)   COPD (chronic obstructive pulmonary disease)   Acute diastolic heart failure   Respiratory failure   Chronic kidney disease (CKD), stage III (moderate)  Time spent: 55  Pleas Koch, MD [facility] ([phone]   09/28/2013, 11:22 AM  LOS: 9 days

## 2013-09-28 NOTE — H&P (View-Only) (Signed)
Patient Name: Mark [name] Date of Encounter: 09/27/2013     Active Problems:   HTN (hypertension)   Atrial fibrillation   Long term (current) use of anticoagulants   Acute on chronic diastolic CHF (congestive heart failure)   COPD (chronic obstructive pulmonary disease)   Acute diastolic heart failure   Respiratory failure   Chronic kidney disease (CKD), stage III (moderate)    SUBJECTIVE  The patient remains in atrial fibrillation with controlled ventricular response.  He has lost 3 pounds overnight.  Renal function is elevated but stable on current diuretic therapy  CURRENT MEDS . amiodarone  400 mg Oral BID  . atorvastatin  40 mg Oral q1800  . diltiazem  300 mg Oral Daily  . furosemide  40 mg Intravenous BID  . guaiFENesin  1,200 mg Oral BID  . insulin aspart  0-15 Units Subcutaneous TID WC  . insulin aspart  3 Units Subcutaneous TID WC  . insulin detemir  10 Units Subcutaneous QHS  . ipratropium-albuterol  3 mL Nebulization TID  . metoprolol succinate  75 mg Oral QPC breakfast  . mometasone-formoterol  2 puff Inhalation BID  . Rivaroxaban  15 mg Oral Q supper  . sodium chloride  3 mL Intravenous Q12H    OBJECTIVE  Filed Vitals:   09/26/13 2008 09/26/13 2018 09/27/13 0544 09/27/13 1145  BP:  114/78 147/89   Pulse:  83 110   Temp:  97.8 F (36.6 C) 97.4 F (36.3 C)   TempSrc:  Oral Oral   Resp:  20 20   Height:      Weight:   153 lb 14.1 oz (69.8 kg)   SpO2: 95% 97% 98% 87%    Intake/Output Summary (Last 24 hours) at 09/27/13 1152 Last data filed at 09/27/13 1113  Gross per 24 hour  Intake   1078 ml  Output   3850 ml  Net  -2772 ml   Filed Weights   09/25/13 0700 09/26/13 0500 09/27/13 0544  Weight: 156 lb 14.4 oz (71.169 kg) 156 lb 4.9 oz (70.9 kg) 153 lb 14.1 oz (69.8 kg)    PHYSICAL EXAM  General: Pleasant, NAD. Neuro: Alert and oriented X 3. Moves all extremities spontaneously. Psych: Normal affect. HEENT:  Normal  Neck: Supple  without bruits or JVD. Lungs:  Mild inspiratory rales.  He is receiving a breathing treatment at the present time. Heart: Irregularly irregular rhythm. Abdomen: Soft, non-tender, non-distended, BS + x 4.  Extremities: Mild peripheral edema.Marland Kitchen. DP/PT/Radials 2+ and equal bilaterally.  Accessory Clinical Findings  CBC No results found for this basename: WBC, NEUTROABS, HGB, HCT, MCV, PLT,  in the last 72 hours Basic Metabolic Panel  Recent Labs  09/25/13 0505 09/27/13 0413  NA 144 141  K 4.4 4.7  CL 96 94*  CO2 35* 38*  GLUCOSE 182* 139*  BUN 61* 63*  CREATININE 2.36* 2.34*  CALCIUM 8.5 8.3*  PHOS 3.0 3.0   Liver Function Tests  Recent Labs  09/25/13 0505 09/27/13 0413  ALBUMIN 2.6* 2.6*   No results found for this basename: LIPASE, AMYLASE,  in the last 72 hours Cardiac Enzymes No results found for this basename: CKTOTAL, CKMB, CKMBINDEX, TROPONINI,  in the last 72 hours BNP No components found with this basename: POCBNP,  D-Dimer No results found for this basename: DDIMER,  in the last 72 hours Hemoglobin A1C No results found for this basename: HGBA1C,  in the last 72 hours Fasting Lipid Panel No results found  for this basename: CHOL, HDL, LDLCALC, TRIG, CHOLHDL, LDLDIRECT,  in the last 72 hours Thyroid Function Tests No results found for this basename: TSH, T4TOTAL, FREET3, T3FREE, THYROIDAB,  in the last 72 hours  TELE  Atrial fibrillation with controlled ventricular response  ECG    Radiology/Studies  Dg Chest 2 View  09/25/2013   CLINICAL DATA:  Pneumonia, shortness of breath  EXAM: CHEST  2 VIEW  COMPARISON:  09/19/2013  FINDINGS: Stable small pleural effusions, slightly larger on the left. Left lower lobe retrocardiac streaky opacity persist without significant change compatible with pneumonia. Minor right base atelectasis. Upper lungs clear. Normal heart size and vascularity. No pneumothorax. Trachea is midline.  IMPRESSION: Small pleural effusions with  persistent left base streaky airspace disease concerning for pneumonia   Electronically Signed   By: Ruel Favors M.D.   On: 09/25/2013 08:06   Dg Chest 2 View  09/10/2013   CLINICAL DATA:  Short of breath.  EXAM: CHEST  2 VIEW  COMPARISON:  09/08/2013  FINDINGS: Lungs are hyperexpanded. There are small bilateral pleural effusions, mildly decreased on the left since the prior study, with mild associated lung base reticular opacity, likely subsegmental atelectasis. There is no pulmonary edema. There is no convincing infiltrate.  Cardiac silhouette is normal in size. The mediastinum is normal in contour.  No pneumothorax.  IMPRESSION: Mild decrease in the small left pleural effusions since the prior study. No other change. No pulmonary edema is evident. Lung hyperexpansion suggests COPD.   Electronically Signed   By: Amie Portland M.D.   On: 09/10/2013 08:20   Dg Chest 2 View  09/08/2013   CLINICAL DATA:  Dyspnea and weakness and recent pneumonia  EXAM: CHEST  2 VIEW  COMPARISON:  Chest x-ray dated August 14, 2013.  FINDINGS: The lungs are well-expanded. The pleural effusion on the left is increased slightly in size. Pleural fluid has developed on the right as well. There are coarse lung markings in the retrocardiac region on the lateral film data lobe may reflect a bibasilar pneumonia. The cardiopericardial silhouette is normal in size. The pulmonary vascularity is not engorged. The mediastinum is normal in width. The observed portions of the bony thorax exhibit no acute abnormalities.  IMPRESSION: The findings are worrisome for bibasilar pneumonia with small but increasing bilateral pleural effusions. There is no evidence of CHF.   Electronically Signed   By: David  [place]   On: 09/08/2013 11:06   Dg Chest Portable 1 View  09/19/2013   CLINICAL DATA:  Shortness of breath  EXAM: PORTABLE CHEST - 1 VIEW  COMPARISON:  09/10/2013  FINDINGS: Small bilateral pleural effusions, left more than right. The volume  may have mildly increased from before. There is also increased lung density at the left base. Reticular opacities at the right base appears similar to prior. Normal heart size.  IMPRESSION: 1. Increased lung opacification at the left base, which could represent pneumonia or pneumonitis. 2. Small bilateral pleural effusions.   Electronically Signed   By: Tiburcio Pea M.D.   On: 09/19/2013 06:53    ASSESSMENT AND PLAN  HTN (hypertension)  Atrial fibrillation, recurrent.  Long term (current) use of anticoagulants  Acute on chronic diastolic CHF (congestive heart failure)  COPD (chronic obstructive pulmonary disease)  Acute diastolic heart failure  Respiratory failure  Chronic kidney disease (CKD), stage III (moderate)  Plan: We will attempt to schedule direct-current cardioversion in his room for Monday.  Will keep n.p.o. tonight after midnight  Signed, Darlin Coco MD

## 2013-09-28 NOTE — Progress Notes (Addendum)
Pt returned from cath lab Post cardioversion  in NSR 80's vs stable. Tolerated clear liq well advanced to Carb modified. Family at bedside.

## 2013-09-28 NOTE — Progress Notes (Signed)
Patient Name: Mark [name] Date of Encounter: 09/28/2013   Active Problems:   HTN (hypertension)   Atrial fibrillation   Long term (current) use of anticoagulants   Acute on chronic diastolic CHF (congestive heart failure)   COPD (chronic obstructive pulmonary disease)   Acute diastolic heart failure   Respiratory failure   Chronic kidney disease (CKD), stage III (moderate)    SUBJECTIVE  The patient remains in atrial fibrillation with ventricular rates 95-120 BPM.  He has lost 6 pounds in the last 48 hours and feels signicantly better. He still has residual LE edema. Renal function is elevated but stable on current diuretic therapy  CURRENT MEDS . amiodarone  400 mg Oral BID  . atorvastatin  40 mg Oral q1800  . diltiazem  300 mg Oral Daily  . guaiFENesin  1,200 mg Oral BID  . insulin aspart  0-15 Units Subcutaneous TID WC  . insulin aspart  3 Units Subcutaneous TID WC  . insulin detemir  10 Units Subcutaneous QHS  . ipratropium-albuterol  3 mL Nebulization TID  . metoprolol succinate  75 mg Oral QPC breakfast  . mometasone-formoterol  2 puff Inhalation BID  . Rivaroxaban  15 mg Oral Q supper  . sodium chloride  3 mL Intravenous Q12H  . sodium chloride  3 mL Intravenous Q12H    OBJECTIVE  Filed Vitals:   09/27/13 1507 09/27/13 1908 09/28/13 0500 09/28/13 0743  BP:  131/83 123/77   Pulse:  83 80   Temp:  97.6 F (36.4 C) 98.2 F (36.8 C)   TempSrc:  Oral Oral   Resp:  20 20   Height:      Weight:   150 lb 6.4 oz (68.221 kg)   SpO2: 94% 93% 93% 95%    Intake/Output Summary (Last 24 hours) at 09/28/13 0756 Last data filed at 09/28/13 2440  Gross per 24 hour  Intake    720 ml  Output   2100 ml  Net  -1380 ml   Filed Weights   09/26/13 0500 09/27/13 0544 09/28/13 0500  Weight: 156 lb 4.9 oz (70.9 kg) 153 lb 14.1 oz (69.8 kg) 150 lb 6.4 oz (68.221 kg)    PHYSICAL EXAM  General: Pleasant, NAD. Neuro: Alert and oriented X 3. Moves all extremities  spontaneously. Psych: Normal affect. HEENT:  Normal  Neck: Supple without bruits or JVD. Lungs:  Mild inspiratory rales.  No crackles, decreased breathing sounds at the bases. Heart: Irregularly irregular rhythm. Abdomen: Soft, non-tender, non-distended, BS + x 4.  Extremities: Mild pitting peripheral edema.Marland Kitchen DP/PT/Radials 2+ and equal bilaterally.  Accessory Clinical Findings  CBC  Recent Labs  09/28/13 0500  WBC 11.2*  HGB 10.2*  HCT 30.3*  MCV 94.1  PLT 186   Basic Metabolic Panel  Recent Labs  09/27/13 0413 09/27/13 1703 09/28/13 0500  NA 141 141 140  K 4.7 5.4* 3.9  CL 94* 93* 93*  CO2 38* 42* 38*  GLUCOSE 139* 104* 122*  BUN 63* 63* 59*  CREATININE 2.34* 2.36* 2.27*  CALCIUM 8.3* 9.5 8.7  MG  --  2.1  --   PHOS 3.0  --  3.7   Liver Function Tests  Recent Labs  09/27/13 0413 09/28/13 0500  ALBUMIN 2.6* 2.5*   TELE  Atrial fibrillation with RVR, ventricular rates 95-120 BPM  ECG    Radiology/Studies  Dg Chest 2 View  09/25/2013   CLINICAL DATA:  Pneumonia, shortness of breath  EXAM: CHEST  2 VIEW  COMPARISON:  09/19/2013  FINDINGS: Stable small pleural effusions, slightly larger on the left. Left lower lobe retrocardiac streaky opacity persist without significant change compatible with pneumonia. Minor right base atelectasis. Upper lungs clear. Normal heart size and vascularity. No pneumothorax. Trachea is midline.  IMPRESSION: Small pleural effusions with persistent left base streaky airspace disease concerning for pneumonia   Electronically Signed   By: Ruel Favorsrevor  Shick M.D.   On: 09/25/2013 08:06     ASSESSMENT AND PLAN  HTN (hypertension)  Atrial fibrillation, recurrent.  Long term (current) use of anticoagulants  Acute on chronic diastolic CHF (congestive heart failure)  COPD (chronic obstructive pulmonary disease)  Acute diastolic heart failure  Respiratory failure  Chronic kidney disease (CKD), stage III (moderate)  Plan: We will attempt  to schedule direct-current cardioversion in his room today.  He is NPO. His kidney function is stable, we will continue diuresis for residual fluid overload.   Signed, [name], Giavanni Odonovan, H MD

## 2013-09-28 NOTE — Interval H&P Note (Signed)
History and Physical Interval Note:  09/28/2013 3:16 PM  Mark [name]  has presented today for surgery, with the diagnosis of Afib  The various methods of treatment have been discussed with the patient and family. After consideration of risks, benefits and other options for treatment, the patient has consented to  Procedure(s): CARDIOVERSION (N/A) as a surgical intervention .  The patient's history has been reviewed, patient examined, no change in status, stable for surgery.  I have reviewed the patient's chart and labs.  Questions were answered to the patient's satisfaction.     Leonia ReevesGregg Latrina Guttman,M.D.

## 2013-09-28 NOTE — Progress Notes (Signed)
Pt resting comfortable on bed. VSS, denies any pain or discomfort. Pt encouraged to keep NPO after MN for bedside Cardioversion on 09/28/2013.

## 2013-09-28 NOTE — Plan of Care (Signed)
Problem: Not Ready for Diet/Lifestyle Change (NB-1.3) Goal: Nutrition education Formal process to instruct or train a patient/client in a skill or to impart knowledge to help patients/clients voluntarily manage or modify food choices and eating behavior to maintain or improve health. Outcome: Completed/Met Date Met:  09/28/13  RD consulted for nutrition education regarding diabetes. RD educated patient on a heart healthy diet last week. Spoke with patient and his wife. Both seemed resistant to diet education. OP diet education has been ordered.    No results found for this basename: HGBA1C    RD provided "Carbohydrate Counting for People with Diabetes" handout from the Academy of [employer]etics. Discussed different food groups and their effects on blood sugar, emphasizing carbohydrate-containing foods. Provided list of carbohydrates and recommended serving sizes of common foods.  Discussed importance of controlled and consistent carbohydrate intake throughout the day. Provided examples of ways to balance meals/snacks and encouraged intake of high-fiber, whole grain complex carbohydrates. Teach back method used.  Expect poor compliance.  Body mass index is 23.91 kg/(m^2). Pt meets criteria for normal weight based on current BMI.  Current diet order is NPO for a procedure, patient has been consuming approximately 100% of CHO-modified/heart healthy meals at this time. Labs and medications reviewed. No further nutrition interventions warranted at this time. RD contact information provided. If additional nutrition issues arise, please re-consult RD.    , RD, LDN, CNSC Pager [phone] After Hours Pager [phone]         

## 2013-09-28 NOTE — CV Procedure (Signed)
EP Procedure Note  Procedure: DCCV  Indication: Symptomatic atrial fibrillation  Findings: After informed consent was obtained, the patient was taken to the diagnostic EP lab in the fasting state. After sedating the patient with IV fentanyl and Versed (50mcg and 4mg  respectively), he was cardioverted with 200 joules of synchronized biphasic energy via the electrodispersive pads in the AP position resulting in restoration of NSR.  Complications: none  Conclusion: successful DCCV of a patient with symptomatic atrial fibrillation.   Mark ReevesGregg Jodell Weitman,M.D.

## 2013-09-29 ENCOUNTER — Telehealth: Payer: Self-pay | Admitting: Physician Assistant

## 2013-09-29 DIAGNOSIS — N189 Chronic kidney disease, unspecified: Secondary | ICD-10-CM

## 2013-09-29 LAB — CBC
HCT: 32.8 % — ABNORMAL LOW (ref 39.0–52.0)
Hemoglobin: 10.9 g/dL — ABNORMAL LOW (ref 13.0–17.0)
MCH: 31.8 pg (ref 26.0–34.0)
MCHC: 33.2 g/dL (ref 30.0–36.0)
MCV: 95.6 fL (ref 78.0–100.0)
PLATELETS: 184 10*3/uL (ref 150–400)
RBC: 3.43 MIL/uL — AB (ref 4.22–5.81)
RDW: 14.4 % (ref 11.5–15.5)
WBC: 12.1 10*3/uL — AB (ref 4.0–10.5)

## 2013-09-29 LAB — GLUCOSE, CAPILLARY
GLUCOSE-CAPILLARY: 161 mg/dL — AB (ref 70–99)
Glucose-Capillary: 211 mg/dL — ABNORMAL HIGH (ref 70–99)

## 2013-09-29 LAB — RENAL FUNCTION PANEL
Albumin: 2.4 g/dL — ABNORMAL LOW (ref 3.5–5.2)
BUN: 56 mg/dL — AB (ref 6–23)
CO2: 36 mEq/L — ABNORMAL HIGH (ref 19–32)
Calcium: 8.6 mg/dL (ref 8.4–10.5)
Chloride: 93 mEq/L — ABNORMAL LOW (ref 96–112)
Creatinine, Ser: 2.18 mg/dL — ABNORMAL HIGH (ref 0.50–1.35)
GFR calc Af Amer: 34 mL/min — ABNORMAL LOW (ref >90)
GFR calc non Af Amer: 29 mL/min — ABNORMAL LOW (ref >90)
GLUCOSE: 138 mg/dL — AB (ref 70–99)
POTASSIUM: 4.9 meq/L (ref 3.7–5.3)
Phosphorus: 4.2 mg/dL (ref 2.3–4.6)
Sodium: 139 mEq/L (ref 137–147)

## 2013-09-29 MED ORDER — FREESTYLE LANCETS MISC: Status: DC

## 2013-09-29 MED ORDER — ATORVASTATIN CALCIUM 40 MG PO TABS: 40.0000 mg | ORAL_TABLET | Freq: Every day | ORAL | Status: DC

## 2013-09-29 MED ORDER — METOPROLOL SUCCINATE ER 25 MG PO TB24: 75.0000 mg | ORAL_TABLET | Freq: Every day | ORAL | Status: DC

## 2013-09-29 MED ORDER — LINAGLIPTIN 5 MG PO TABS: 5.0000 mg | ORAL_TABLET | Freq: Every day | ORAL | Status: DC

## 2013-09-29 MED ORDER — INSULIN DETEMIR 100 UNIT/ML ~~LOC~~ SOLN: 10.0000 [IU] | Freq: Every day | SUBCUTANEOUS | Status: DC

## 2013-09-29 MED ORDER — FREESTYLE SYSTEM KIT: 1.0000 | PACK | Status: DC | PRN

## 2013-09-29 MED ORDER — FLUTICASONE-SALMETEROL 115-21 MCG/ACT IN AERO: 2.0000 | INHALATION_SPRAY | Freq: Two times a day (BID) | RESPIRATORY_TRACT | Status: DC

## 2013-09-29 MED ORDER — GLUCOSE BLOOD VI STRP: ORAL_STRIP | Status: DC

## 2013-09-29 MED ORDER — AMIODARONE HCL 200 MG PO TABS: 200.0000 mg | ORAL_TABLET | Freq: Two times a day (BID) | ORAL | Status: DC

## 2013-09-29 NOTE — Progress Notes (Signed)
Patient's IV and telemetry has been discontinued, patient verbalizes understanding of discharge instructions as well as his family, they have no further questions and the patient is being discharged home.  Lorretta Harp. Ishika Chesterfield RN

## 2013-09-29 NOTE — Discharge Summary (Signed)
Physician Discharge Summary  Mark [name] ZOX:[id number] DOB: [birth year] DOA: 09/19/2013  PCP: Leo Grosser, MD  Admit date: 09/19/2013 Discharge date: 09/29/2013  Time spent: 45 minutes  Recommendations for Outpatient Follow-up:  1. Needs daily weights with scale 2. Needs diabetic education and has been given a meter 3. Needs close Cardiology f/u 4. Needs complete metabolic panel as well as TSH in about 4-5 days 5. Recommend outpatient diabetic teaching 6. Please institute other sulfonylurea if you see fit as an outpatient-he had steroids during this hospital stay so I don't think he qualifies truly is being a diabetic 7. Patient has not been given Lasix on discharge as he had mild renal insufficiencyand a contraction alkalosis. If he gains 2-3 pounds of weight or come swollen however, he will need followup and will need Lasix 8. He is to followup with his cardiologist soon-tapering doses of amiodarone have prescribed for him  Discharge Diagnoses:  Active Problems:   HTN (hypertension)   Atrial fibrillation   Long term (current) use of anticoagulants   Acute on chronic diastolic CHF (congestive heart failure)   COPD (chronic obstructive pulmonary disease)   Acute diastolic heart failure   Respiratory failure   Chronic kidney disease (CKD), stage III (moderate)   Discharge Condition: stable  Diet recommendation: heart healthy low-salt  Filed Weights   09/27/13 0544 09/28/13 0500 09/29/13 0447  Weight: 69.8 kg (153 lb 14.1 oz) 68.221 kg (150 lb 6.4 oz) 68.493 kg (151 lb)    History of present illness:   68 y.o. ? known COPD [unclear stage], stage III chronic kidney disease, atrial fibrillation, diastolic congestive heart failure[Nuc stress 10/09/12=LVEF59%, Nl L wall motion] , AAA repair 10/15/12 c multiple hospitalizations recently. Admit on 09/08/2013 discharged on 09/12/2013 at which time he presented with acute on chronic diastolic congestive heart failure. creatinine  peaked at 3.49 on 09/11/2013. ?  diuretics were held on d/c. Snce discharge he has had progressive dyspnea at rest, cough associated with white sputum production, wheezing, worsening of bilateral lower extremity edema, 6 pound weight gain in 1 week. He denies chest pain, fevers chills, nausea or vomiting, abdominal pain, syncope, presyncope, bloody stools.  ED work-up= BNP of 6856. EKG showed atrial fibrillation. patient noted to be an respiratory distress and was administered supplemental oxygen via nonrebreather.  He was cardioverted with DC cardioversion on 09/23/13 but then reverted to A. fib at 115 to 120 range  Cardiology managing currently plan is for Dc cardioversion 1/12   Hospital Course:   Acute on chronic diastolic congestive heart failure.  - worsening dyspnea at rest, 6 pound weight gain, increasing bilateral extremity pitting edema.  - 8.06 L since admission. Discharge weight last hospital stay 154, current weight 153 on the -IV Lasix was completely discontinued this hospital stay -He will need close outpatient followup with cardiology Atrial fibrillation, CHad2Vasc2 score=2-3. S/p direct cardioversion 150 J 09/22/13  -Cont amiodarone 200 mg by mouth twice a day-tapered to 200 mg once daily on 1/17 Continue metoprolol 75 mg daily and Cardizem 300 mg by mouth daily.  -Needs TSH, LFTs in 4-6 weeks as is on chronic amiodarone  -Placed on Xarelto 15 mg daily due to renal fnxn.  -Cardioversion performed on 1/8, 1/12, currently in sinus rhythm Multifactorial acute respiratory failure,  -likely secondary to combined congestive heart failure and COPD exacerbation.  -Azithromycin 1/4-1/8  -Afebrile, leukocytosis 2/2 prednisone  Potential pneumonia  -CXR with left base opacification, Azithro discontinued 1/8  -1/9 repeat CXR =effusions Chronic  COPD, unclear as to GOLD stage  -Placed on IV steroids on admission, rapid taper to off 09/25/13  Mild metabolic alkalosis  -labs stable  Stage  III chronic kidney disease.  -Patient's baseline creatinine near 2.3, 2.4. He presently appears to be at his baseline.  -if creatinine worsens, knee discussion regarding this with cardiologist and potential renal consult-cardiorenal syndrome Mild-mod mitral regurgitation  -monitor  Hypertension  - Continue Cardizem and metoprolol therapy. Nutrition.  Hyperlipidemia  -continue atorvastatin 40 daily  Hyperglycemia , steroid-induced-Would NOT term as DM right now  -Continue diabetic diet  -Worsened by steroid-induced hyperglycemia  -metformin contraindicated given renal function -Started Tradjenta this admission -Started Levemir 7 units---> 10 units -Eating 100% of meals  -will need extensive teaching, appreciate dietitian input  Normocytic anemia  -Monitor as an outpatient as is on anticoagulation    Consultants:  Cardiology Procedures:  Cardioversion 1/7, 1/12 ECHO 1/6 EF 55-50  Discharge Exam: Filed Vitals:   09/29/13 0956  BP: 125/70  Pulse: 72  Temp: 97.5 F (36.4 C)  Resp: 20   Alert pleasant no CP n/n/v Maintaining sinus  General:  EOMI, NCAT  Cardiovascular: s1 s2 no m/r/g rrr Respiratory: clear NO Le edema  Discharge Instructions  Discharge Orders   Future Appointments Provider Department Dept Phone   10/14/2013 11:30 AM Beatrice Lecher, PA-C [facility] [place] Office [phone]   10/16/2013 8:00 AM Donita Brooks, MD [facility] [phone]   12/29/2013 9:00 AM [facility] [place] [facility] [address] [phone]   12/29/2013 9:30 AM Pryor Ochoa, MD [facility] [phone]   Future Orders Complete By Expires   Contraindication to ACEI at discharge  As directed    Diet - low sodium heart healthy  As directed    Discharge instructions  As directed    Comments:     Need Diabetes education Check Blood sugar x 2 per day Need daily weights Inform Dr. Tanya Nones of more than 2-3 lb weight  gain in 1 day-You might need a "fluid pill" like lasix. You will need labs in about 4-5 days   For home use only DME Glucometer  As directed    For home use only DME Glucometer  As directed    Heart Failure patients record your daily weight using the same scale at the same time of day  As directed    Increase activity slowly  As directed    Referral to Nutrition and Diabetes Services  As directed    Questions:     Check all special needs that apply to patient requiring 1 on 1 DSMT:  Low literacy   Choose type of Diabetes Self-Management Training (DSMT) training services and number of hours requested:  Does not apply   DSMT Content:  Self-blood glucose monitoring   Choose the type of Medical Nutrition Therapy (MNT) and number of hours:  Does not apply   If add'l MNT in same year, specify change in condition, treatment, diagnosis:     FOR MEDICARE PATIENTS: I hereby certify that I am managing this beneficiary's diabetes condition and that the above prescribed training is a necessary part of management.:  Yes       Medication List    STOP taking these medications       furosemide 40 MG tablet  Commonly known as:  LASIX      TAKE these medications       albuterol 108 (90 BASE) MCG/ACT  inhaler  Commonly known as:  PROVENTIL HFA;VENTOLIN HFA  Inhale 2 puffs into the lungs every 6 (six) hours as needed for wheezing or shortness of breath.     amiodarone 200 MG tablet  Commonly known as:  PACERONE  Take 1 tablet (200 mg total) by mouth 2 (two) times daily.     atorvastatin 40 MG tablet  Commonly known as:  LIPITOR  Take 1 tablet (40 mg total) by mouth daily.     diltiazem 300 MG 24 hr capsule  Commonly known as:  CARDIZEM CD  Take 1 capsule (300 mg total) by mouth daily.     fluticasone-salmeterol 115-21 MCG/ACT inhaler  Commonly known as:  ADVAIR HFA  Inhale 2 puffs into the lungs 2 (two) times daily.     linagliptin 5 MG Tabs tablet  Commonly known as:  TRADJENTA  Take 1  tablet (5 mg total) by mouth daily.     metoprolol succinate 25 MG 24 hr tablet  Commonly known as:  TOPROL-XL  Take 3 tablets (75 mg total) by mouth daily after breakfast. Take with or immediately following a meal.     omeprazole 40 MG capsule  Commonly known as:  PRILOSEC  Take 1 capsule (40 mg total) by mouth 2 (two) times daily.     predniSONE 20 MG tablet  Commonly known as:  DELTASONE  3 tabs poqday 1-2, 2 tabs poqday 3-4, 1 poqday 5-6     Rivaroxaban 15 MG Tabs tablet  Commonly known as:  XARELTO  Take 1 tablet (15 mg total) by mouth daily with supper.       Allergies  Allergen Reactions  . Niaspan [Niacin Er] Anaphylaxis       Follow-up Information   Schedule an appointment as soon as possible for a visit with Lars Masson, MD.   Specialty:  Cardiology   Contact information:   [address] [place] [address] [phone]       Follow up with [facility] TOM, MD. Schedule an appointment as soon as possible for a visit in 5 days.   Specialty:  Family Medicine   Contact information:   [address] [place] [address] [phone]        The results of significant diagnostics from this hospitalization (including imaging, microbiology, ancillary and laboratory) are listed below for reference.    Significant Diagnostic Studies: Dg Chest 2 View  09/25/2013   CLINICAL DATA:  Pneumonia, shortness of breath  EXAM: CHEST  2 VIEW  COMPARISON:  09/19/2013  FINDINGS: Stable small pleural effusions, slightly larger on the left. Left lower lobe retrocardiac streaky opacity persist without significant change compatible with pneumonia. Minor right base atelectasis. Upper lungs clear. Normal heart size and vascularity. No pneumothorax. Trachea is midline.  IMPRESSION: Small pleural effusions with persistent left base streaky airspace disease concerning for pneumonia   Electronically Signed   By: Ruel Favors M.D.   On: 09/25/2013 08:06    Dg Chest 2 View  09/10/2013   CLINICAL DATA:  Short of breath.  EXAM: CHEST  2 VIEW  COMPARISON:  09/08/2013  FINDINGS: Lungs are hyperexpanded. There are small bilateral pleural effusions, mildly decreased on the left since the prior study, with mild associated lung base reticular opacity, likely subsegmental atelectasis. There is no pulmonary edema. There is no convincing infiltrate.  Cardiac silhouette is normal in size. The mediastinum is normal in contour.  No pneumothorax.  IMPRESSION: Mild decrease in  the small left pleural effusions since the prior study. No other change. No pulmonary edema is evident. Lung hyperexpansion suggests COPD.   Electronically Signed   By: Amie Portlandavid  Ormond M.D.   On: 09/10/2013 08:20   Dg Chest 2 View  09/08/2013   CLINICAL DATA:  Dyspnea and weakness and recent pneumonia  EXAM: CHEST  2 VIEW  COMPARISON:  Chest x-ray dated August 14, 2013.  FINDINGS: The lungs are well-expanded. The pleural effusion on the left is increased slightly in size. Pleural fluid has developed on the right as well. There are coarse lung markings in the retrocardiac region on the lateral film data lobe may reflect a bibasilar pneumonia. The cardiopericardial silhouette is normal in size. The pulmonary vascularity is not engorged. The mediastinum is normal in width. The observed portions of the bony thorax exhibit no acute abnormalities.  IMPRESSION: The findings are worrisome for bibasilar pneumonia with small but increasing bilateral pleural effusions. There is no evidence of CHF.   Electronically Signed   By: David  SwazilandJordan   On: 09/08/2013 11:06   Dg Chest Portable 1 View  09/19/2013   CLINICAL DATA:  Shortness of breath  EXAM: PORTABLE CHEST - 1 VIEW  COMPARISON:  09/10/2013  FINDINGS: Small bilateral pleural effusions, left more than right. The volume may have mildly increased from before. There is also increased lung density at the left base. Reticular opacities at the right base appears  similar to prior. Normal heart size.  IMPRESSION: 1. Increased lung opacification at the left base, which could represent pneumonia or pneumonitis. 2. Small bilateral pleural effusions.   Electronically Signed   By: Tiburcio PeaJonathan  Watts M.D.   On: 09/19/2013 06:53    Microbiology: No results found for this or any previous visit (from the past 240 hour(s)).   Labs: Basic Metabolic Panel:  Recent Labs Lab 09/24/13 0245 09/25/13 0505 09/27/13 0413 09/27/13 1703 09/28/13 0500 09/29/13 0430  NA 140 144 141 141 140 139  K 4.4 4.4 4.7 5.4* 3.9 4.9  CL 93* 96 94* 93* 93* 93*  CO2 34* 35* 38* 42* 38* 36*  GLUCOSE 273* 182* 139* 104* 122* 138*  BUN 59* 61* 63* 63* 59* 56*  CREATININE 2.45* 2.36* 2.34* 2.36* 2.27* 2.18*  CALCIUM 8.8 8.5 8.3* 9.5 8.7 8.6  MG  --   --   --  2.1  --   --   PHOS  --  3.0 3.0  --  3.7 4.2   Liver Function Tests:  Recent Labs Lab 09/25/13 0505 09/27/13 0413 09/28/13 0500 09/29/13 0430  ALBUMIN 2.6* 2.6* 2.5* 2.4*   No results found for this basename: LIPASE, AMYLASE,  in the last 168 hours No results found for this basename: AMMONIA,  in the last 168 hours CBC:  Recent Labs Lab 09/23/13 0320 09/28/13 0500 09/29/13 0430  WBC 11.7* 11.2* 12.1*  HGB 10.4* 10.2* 10.9*  HCT 31.8* 30.3* 32.8*  MCV 96.1 94.1 95.6  PLT 187 186 184   Cardiac Enzymes: No results found for this basename: CKTOTAL, CKMB, CKMBINDEX, TROPONINI,  in the last 168 hours BNP: BNP (last 3 results)  Recent Labs  09/08/13 1032 09/19/13 0628  PROBNP 5402.0* 6856.0*   CBG:  Recent Labs Lab 09/27/13 2115 09/28/13 0600 09/28/13 1059 09/28/13 2109 09/29/13 0551  GLUCAP 149* 101* 89 345* 161*       Signed:  Derral Colucci, JAI-GURMUKH  [facility] 09/29/2013, 10:18 AM

## 2013-09-29 NOTE — Telephone Encounter (Signed)
New Problem:  Per Tereso NewcomerScott Weaver,   Patient d/c today from hospital.  Arrange follow up with me on 1/20 in one of the TCM spots and plug him into TCM (7 day) follow up (will need a phone call to update how he is doing).  His original follow up is 1/28. Leave that appointment in place for now.   Pt has appt on 1/20 at 12:10... Pt is TCM

## 2013-09-29 NOTE — Progress Notes (Signed)
Patient Name: Mark [name] Date of Encounter: 09/29/2013   Active Problems:   HTN (hypertension)   Atrial fibrillation   Long term (current) use of anticoagulants   Acute on chronic diastolic CHF (congestive heart failure)   COPD (chronic obstructive pulmonary disease)   Acute diastolic heart failure   Respiratory failure   Chronic kidney disease (CKD), stage III (moderate)    SUBJECTIVE  The patient remains in SR since the CV yesterday, he denies SOB.   CURRENT MEDS . amiodarone  400 mg Oral BID  . atorvastatin  40 mg Oral q1800  . diltiazem  300 mg Oral Daily  . guaiFENesin  1,200 mg Oral BID  . insulin detemir  10 Units Subcutaneous QHS  . ipratropium-albuterol  3 mL Nebulization TID  . linagliptin  5 mg Oral Daily  . metoprolol succinate  75 mg Oral QPC breakfast  . mometasone-formoterol  2 puff Inhalation BID  . Rivaroxaban  15 mg Oral Q supper  . sodium chloride  3 mL Intravenous Q12H  . sodium chloride  3 mL Intravenous Q12H  . sodium chloride  3 mL Intravenous Q12H    OBJECTIVE  Filed Vitals:   09/28/13 2037 09/29/13 0210 09/29/13 0447 09/29/13 0807  BP: 124/72 130/68 126/69   Pulse: 76 74 73   Temp: 97.6 F (36.4 C) 97.9 F (36.6 C) 98.2 F (36.8 C)   TempSrc: Oral Oral Oral   Resp: 20 20 20    Height:      Weight:   151 lb (68.493 kg)   SpO2:  94% 96% 98%    Intake/Output Summary (Last 24 hours) at 09/29/13 0834 Last data filed at 09/29/13 0451  Gross per 24 hour  Intake    480 ml  Output   1650 ml  Net  -1170 ml   Filed Weights   09/27/13 0544 09/28/13 0500 09/29/13 0447  Weight: 153 lb 14.1 oz (69.8 kg) 150 lb 6.4 oz (68.221 kg) 151 lb (68.493 kg)    PHYSICAL EXAM  General: Pleasant, NAD. Neuro: Alert and oriented X 3. Moves all extremities spontaneously. Psych: Normal affect. HEENT:  Normal  Neck: Supple without bruits or JVD. Lungs:  Mild inspiratory rales.  No crackles, decreased breathing sounds at the bases. Heart:  Irregularly irregular rhythm. Abdomen: Soft, non-tender, non-distended, BS + x 4.  Extremities: Mild pitting peripheral edema.Marland Kitchen. DP/PT/Radials 2+ and equal bilaterally.  Accessory Clinical Findings  CBC  Recent Labs  09/28/13 0500 09/29/13 0430  WBC 11.2* 12.1*  HGB 10.2* 10.9*  HCT 30.3* 32.8*  MCV 94.1 95.6  PLT 186 184   Basic Metabolic Panel  Recent Labs  09/27/13 1703 09/28/13 0500 09/29/13 0430  NA 141 140 139  K 5.4* 3.9 4.9  CL 93* 93* 93*  CO2 42* 38* 36*  GLUCOSE 104* 122* 138*  BUN 63* 59* 56*  CREATININE 2.36* 2.27* 2.18*  CALCIUM 9.5 8.7 8.6  MG 2.1  --   --   PHOS  --  3.7 4.2   Liver Function Tests  Recent Labs  09/28/13 0500 09/29/13 0430  ALBUMIN 2.5* 2.4*   TELE  Atrial fibrillation with RVR, ventricular rates 95-120 BPM  ECG    Radiology/Studies  Dg Chest 2 View  09/25/2013   CLINICAL DATA:  Pneumonia, shortness of breath  EXAM: CHEST  2 VIEW  COMPARISON:  09/19/2013  FINDINGS: Stable small pleural effusions, slightly larger on the left. Left lower lobe retrocardiac streaky opacity persist without significant  change compatible with pneumonia. Minor right base atelectasis. Upper lungs clear. Normal heart size and vascularity. No pneumothorax. Trachea is midline.  IMPRESSION: Small pleural effusions with persistent left base streaky airspace disease concerning for pneumonia   Electronically Signed   By: Ruel Favors M.D.   On: 09/25/2013 08:06     ASSESSMENT AND PLAN  HTN (hypertension)  Atrial fibrillation, recurrent.  Long term (current) use of anticoagulants  Acute on chronic diastolic CHF (congestive heart failure)  COPD (chronic obstructive pulmonary disease)  Acute diastolic heart failure  Respiratory failure  Chronic kidney disease (CKD), stage III (moderate)  1. Parox A-fib - now in SR, continue Amiodarone 200 mg PO BID until the next of this week, then 200 mg PO daily. Continue Diltiazem and metoprolol at the current  dose. Please arrange a follow up appointment at our clinic. Continue Xarelto.   2. Acute on chronic diastolic CHF - improved, the patient just have minimal residual fluid overload. Significant negative fluid balance without diuretics, hold until AKI improves, hold until the next clinic visit.   Signed, Lars Masson MD

## 2013-09-30 ENCOUNTER — Other Ambulatory Visit: Payer: Self-pay | Admitting: Family Medicine

## 2013-09-30 MED ORDER — LINAGLIPTIN 5 MG PO TABS: 5.0000 mg | ORAL_TABLET | Freq: Every day | ORAL | Status: DC

## 2013-09-30 MED ORDER — FREESTYLE SYSTEM KIT: 1.0000 | PACK | Status: DC | PRN

## 2013-09-30 MED ORDER — GLUCOSE BLOOD VI STRP: ORAL_STRIP | Status: DC

## 2013-09-30 MED ORDER — FREESTYLE LANCETS MISC: Status: DC

## 2013-09-30 NOTE — Telephone Encounter (Signed)
Reviewed meds with pt/ pt has all meds available. Pt is aware of app time/ date. Encouraged him to call with further questions or concerns.

## 2013-10-05 ENCOUNTER — Ambulatory Visit (INDEPENDENT_AMBULATORY_CARE_PROVIDER_SITE_OTHER): Payer: Medicare Other | Admitting: Family Medicine

## 2013-10-05 ENCOUNTER — Encounter: Payer: Self-pay | Admitting: Family Medicine

## 2013-10-05 VITALS — BP 150/68 | HR 82 | Temp 97.0°F | Resp 20 | Ht 66.5 in | Wt 156.0 lb

## 2013-10-05 DIAGNOSIS — J441 Chronic obstructive pulmonary disease with (acute) exacerbation: Secondary | ICD-10-CM

## 2013-10-05 DIAGNOSIS — I4891 Unspecified atrial fibrillation: Secondary | ICD-10-CM

## 2013-10-05 DIAGNOSIS — N183 Chronic kidney disease, stage 3 unspecified: Secondary | ICD-10-CM

## 2013-10-05 DIAGNOSIS — Z09 Encounter for follow-up examination after completed treatment for conditions other than malignant neoplasm: Secondary | ICD-10-CM

## 2013-10-05 LAB — COMPLETE METABOLIC PANEL WITH GFR
ALBUMIN: 2.9 g/dL — AB (ref 3.5–5.2)
ALK PHOS: 69 U/L (ref 39–117)
ALT: 27 U/L (ref 0–53)
AST: 19 U/L (ref 0–37)
BUN: 32 mg/dL — ABNORMAL HIGH (ref 6–23)
CHLORIDE: 102 meq/L (ref 96–112)
CO2: 25 mEq/L (ref 19–32)
Calcium: 8.9 mg/dL (ref 8.4–10.5)
Creat: 2.02 mg/dL — ABNORMAL HIGH (ref 0.50–1.35)
GFR, EST NON AFRICAN AMERICAN: 33 mL/min — AB
GFR, Est African American: 38 mL/min — ABNORMAL LOW
GLUCOSE: 114 mg/dL — AB (ref 70–99)
POTASSIUM: 5.5 meq/L — AB (ref 3.5–5.3)
Sodium: 138 mEq/L (ref 135–145)
TOTAL PROTEIN: 5.6 g/dL — AB (ref 6.0–8.3)
Total Bilirubin: 0.7 mg/dL (ref 0.3–1.2)

## 2013-10-05 LAB — HEMOGLOBIN A1C
Hgb A1c MFr Bld: 7.5 % — ABNORMAL HIGH (ref <5.7)
Mean Plasma Glucose: 169 mg/dL — ABNORMAL HIGH (ref <117)

## 2013-10-05 NOTE — Progress Notes (Signed)
Subjective:    Patient ID: Mark [name], male    DOB: [birth year], 68 y.o.   MRN: [id number]  HPI Patient was admitted to the hospital with CHF and COPD exacerbation in early January.  I copied the relevant portions of his discharge summary and included them below: Admit date: 09/19/2013  Discharge date: 09/29/2013  Time spent: 45 minutes  Recommendations for Outpatient Follow-up:  1. Needs daily weights with scale 2. Needs diabetic education and has been given a meter 3. Needs close Cardiology f/u 4. Needs complete metabolic panel as well as TSH in about 4-5 days 5. Recommend outpatient diabetic teaching 6. Please institute other sulfonylurea if you see fit as an outpatient-he had steroids during this hospital stay so I don't think he qualifies truly is being a diabetic 7. Patient has not been given Lasix on discharge as he had mild renal insufficiencyand a contraction alkalosis. If he gains 2-3 pounds of weight or come swollen however, he will need followup and will need Lasix 8. He is to followup with his cardiologist soon-tapering doses of amiodarone have prescribed for him Discharge Diagnoses:  Active Problems:  HTN (hypertension)  Atrial fibrillation  Long term (current) use of anticoagulants  Acute on chronic diastolic CHF (congestive heart failure)  COPD (chronic obstructive pulmonary disease)  Acute diastolic heart failure  Respiratory failure  Chronic kidney disease (CKD), stage III (moderate)  Discharge Condition: stable  Diet recommendation: heart healthy low-salt  Filed Weights    09/27/13 0544  09/28/13 0500  09/29/13 0447   Weight:  69.8 kg (153 lb 14.1 oz)  68.221 kg (150 lb 6.4 oz)  68.493 kg (151 lb)   History of present illness:  68 y.o. ? known COPD [unclear stage], stage III chronic kidney disease, atrial fibrillation, diastolic congestive heart failure[Nuc stress 10/09/12=LVEF59%, Nl L wall motion] , AAA repair 10/15/12 c multiple hospitalizations recently.  Admit on 09/08/2013 discharged on 09/12/2013 at which time he presented with acute on chronic diastolic congestive heart failure. creatinine peaked at 3.49 on 09/11/2013. ?  diuretics were held on d/c. Snce discharge he has had progressive dyspnea at rest, cough associated with white sputum production, wheezing, worsening of bilateral lower extremity edema, 6 pound weight gain in 1 week. He denies chest pain, fevers chills, nausea or vomiting, abdominal pain, syncope, presyncope, bloody stools.  ED work-up= BNP of 6856. EKG showed atrial fibrillation. patient noted to be an respiratory distress and was administered supplemental oxygen via nonrebreather.  He was cardioverted with DC cardioversion on 09/23/13 but then reverted to A. fib at 115 to 120 range  Cardiology managing currently plan is for Dc cardioversion 1/12  Hospital Course:  Acute on chronic diastolic congestive heart failure.  - worsening dyspnea at rest, 6 pound weight gain, increasing bilateral extremity pitting edema.  - 8.06 L since admission. Discharge weight last hospital stay 154, current weight 153 on the  -IV Lasix was completely discontinued this hospital stay  -He will need close outpatient followup with cardiology  Atrial fibrillation, CHad2Vasc2 score=2-3. S/p direct cardioversion 150 J 09/22/13  -Cont amiodarone 200 mg by mouth twice a day-tapered to 200 mg once daily on 1/17  Continue metoprolol 75 mg daily and Cardizem 300 mg by mouth daily.  -Needs TSH, LFTs in 4-6 weeks as is on chronic amiodarone  -Placed on Xarelto 15 mg daily due to renal fnxn.  -Cardioversion performed on 1/8, 1/12, currently in sinus rhythm  Multifactorial acute respiratory failure,  -likely secondary to  combined congestive heart failure and COPD exacerbation.  -Azithromycin 1/4-1/8  -Afebrile, leukocytosis 2/2 prednisone  Potential pneumonia  -CXR with left base opacification, Azithro discontinued 1/8  -1/9 repeat CXR =effusions Chronic COPD,  unclear as to GOLD stage  -Placed on IV steroids on admission, rapid taper to off 01/20/42  Mild metabolic alkalosis  -labs stable  Stage III chronic kidney disease.  -Patient's baseline creatinine near 2.3, 2.4. He presently appears to be at his baseline.  -if creatinine worsens, need discussion regarding this with cardiologist and potential renal consult-cardiorenal syndrome  Mild-mod mitral regurgitation  -monitor  Hypertension  - Continue Cardizem and metoprolol therapy. Nutrition.  Hyperlipidemia  -continue atorvastatin 40 daily  Hyperglycemia , steroid-induced-Would NOT term as DM right now  -Continue diabetic diet  -Worsened by steroid-induced hyperglycemia  -metformin contraindicated given renal function  -Started Tradjenta this admission  -Started Levemir 7 units---> 10 units  -Eating 100% of meals  -will need extensive teaching, appreciate dietitian input  Normocytic anemia  -Monitor as an outpatient as is on anticoagulation  Consultants:  Cardiology Procedures:  Cardioversion 1/7, 1/12  ECHO 1/6 EF 55-50   Patient states that his breathing has worsened slightly since coming home from the hospital. He is currently satting 87% on 3 L nasal cannula. His weight is 156 pounds today in clinic. He was 151 when he was discharged from the hospital. He has +1 pedal edema in both legs. I could also appreciate some bi-basilar crackles. He is not currently taking any diuretic. He is also wheezing slightly on exam. He has started on Advair since discharge from the hospital although he cannot tell if the medicine seems to be helping.  His blood sugars are ranging 98-200. He does not have a history of diabetes. His pressure has always been well controlled in this clinic. I believe the hyperglycemia is likely due to steroids and not true diabetes. He has not had a hemoglobin A1c. Past Medical History  Diagnosis Date  . GERD (gastroesophageal reflux disease)   . Hyperlipidemia   . Atrial  fibrillation     Stress test 10/09/12  . H/O hiatal hernia   . Hypertension     Sees Dr. Jenna Luo  . AAA (abdominal aortic aneurysm)   . COPD (chronic obstructive pulmonary disease)   . Diastolic heart failure   . Pneumonia   . Arthritis     "little in my hands" (09/08/2013)  . Chronic kidney disease (CKD), stage III (moderate)    Past Surgical History  Procedure Laterality Date  . Knee arthroscopy Right   . Abdominal aortic aneurysm repair  10/15/2012    Procedure: ANEURYSM ABDOMINAL AORTIC REPAIR;  Surgeon: Mal Misty, MD;  Location: [facility] OR;  Service: Vascular;  Laterality: N/A;  Resection and Grafting of Abdominal Aortic Aneurysm - Aortobi-Iliac   . Tee without cardioversion N/A 09/22/2013    Procedure: TRANSESOPHAGEAL ECHOCARDIOGRAM (TEE);  Surgeon: Dorothy Spark, MD;  Location: [facility];  Service: Cardiovascular;  Laterality: N/A;  . Cardioversion N/A 09/22/2013    Procedure: CARDIOVERSION;  Surgeon: Dorothy Spark, MD;  Location: [facility] ENDOSCOPY;  Service: Cardiovascular;  Laterality: N/A;   Current Outpatient Prescriptions on File Prior to Visit  Medication Sig Dispense Refill  . albuterol (PROVENTIL HFA;VENTOLIN HFA) 108 (90 BASE) MCG/ACT inhaler Inhale 2 puffs into the lungs every 6 (six) hours as needed for wheezing or shortness of breath.  1 Inhaler  2  . amiodarone (PACERONE) 200 MG tablet Take 1 tablet (200  mg total) by mouth 2 (two) times daily.  30 tablet  0  . atorvastatin (LIPITOR) 40 MG tablet Take 1 tablet (40 mg total) by mouth daily.  30 tablet  5  . diltiazem (CARDIZEM CD) 300 MG 24 hr capsule Take 1 capsule (300 mg total) by mouth daily.  30 capsule  0  . fluticasone-salmeterol (ADVAIR HFA) 115-21 MCG/ACT inhaler Inhale 2 puffs into the lungs 2 (two) times daily.  1 Inhaler  12  . glucose blood test strip Check BS TID Dx - 250.00  100 each  12  . glucose monitoring kit (FREESTYLE) monitoring kit 1 each by Does not apply route as needed for other. DX -  250.00  1 each  0  . insulin detemir (LEVEMIR) 100 UNIT/ML injection Inject 0.1 mLs (10 Units total) into the skin at bedtime.  10 mL  11  . Lancets (FREESTYLE) lancets Check BS TID  Dx - 250.00  100 each  12  . linagliptin (TRADJENTA) 5 MG TABS tablet Take 1 tablet (5 mg total) by mouth daily.  30 tablet  3  . metoprolol succinate (TOPROL-XL) 25 MG 24 hr tablet Take 3 tablets (75 mg total) by mouth daily after breakfast. Take with or immediately following a meal.  90 tablet  0  . omeprazole (PRILOSEC) 40 MG capsule Take 1 capsule (40 mg total) by mouth 2 (two) times daily.  60 capsule  1  . Rivaroxaban (XARELTO) 15 MG TABS tablet Take 1 tablet (15 mg total) by mouth daily with supper.  30 tablet  0   No current facility-administered medications on file prior to visit.   Allergies  Allergen Reactions  . Niaspan [Niacin Er] Anaphylaxis   History   Social History  . Marital Status: Married    Spouse Name: Earlie Server    Number of Children: 4  . Years of Education: N/A   Occupational History  . Works PT as a Curator    Social History Main Topics  . Smoking status: Former Smoker -- 1.00 packs/day for 56 years    Types: Cigarettes  . Smokeless tobacco: Former Systems developer    Types: Chew     Comment: 09/08/2013 "stopped smoking cigarettes ~ 2 months ago; aien't chew in probably 10 yrs"  . Alcohol Use: No  . Drug Use: No  . Sexual Activity: Not Currently   Other Topics Concern  . Not on file   Social History Narrative   Married.  Lives with wife.  Ambulates without assistance.     Review of Systems  All other systems reviewed and are negative.       Objective:   Physical Exam  Vitals reviewed. Constitutional: He appears well-developed and well-nourished. No distress.  Cardiovascular: Normal rate, regular rhythm, normal heart sounds and intact distal pulses.  Exam reveals no gallop and no friction rub.   No murmur heard. Pulmonary/Chest: Effort normal. He has decreased breath  sounds. He has wheezes. He has rales in the right lower field and the left lower field.  Abdominal: Soft. Bowel sounds are normal. He exhibits no distension and no mass. There is no tenderness. There is no rebound and no guarding.  Musculoskeletal: He exhibits edema.  Skin: Skin is warm. No rash noted. He is not diaphoretic. No erythema. No pallor.          Assessment & Plan:  1. Hospital discharge follow-up I do not feel that the patient is truly a diabetic. I believe his hyperglycemia is likely  due to steroids used to treat his COPD. I have asked the patient to discontinue insulin. I will continue Tradjenta temporarily.  I have asked the patient to monitor fasting blood sugars and two-hour postprandial sugars over the next week. If his sugars continue to follow as he is home from the hospital I will likely then discontinue all his diabetic medication.  I believe the shortness of breath is likely a combination of his COPD and some mild pulmonary edema that has developed since he discontinued his diuretic upon discharge from the hospital. I have asked the patient to resume Lasix 40 mg by mouth daily for the next 3 days. I will see him back in clinic on Thursday. At that time we'll assess whether not the patient needs to continue a daily diuretic or resume PRN lasix. I have also asked the patient to discontinue Advair and I will replace it with Symbicort 160/4.5, 2 puffs inhaled twice a day. - Hemoglobin A1c  2. Atrial fibrillation Currently in normal sinus rhythm. On recheck a TSH as well as liver function tests in 4 weeks. Continue xarelto.    3. COPD exacerbation Continue oxygen, start symbicort 160/4.5 2 puff inh bid and discontinue advair.  4. CKD (chronic kidney disease), stage III I will recheck the patient in 3 days. We may need to discontinue Lasix at that time if the patient is beginning to develop signs of worsening renal insufficiency. - COMPLETE METABOLIC PANEL WITH GFR - CBC with  Differential

## 2013-10-06 ENCOUNTER — Encounter: Payer: Self-pay | Admitting: Physician Assistant

## 2013-10-06 ENCOUNTER — Ambulatory Visit (INDEPENDENT_AMBULATORY_CARE_PROVIDER_SITE_OTHER): Payer: Medicare Other | Admitting: Physician Assistant

## 2013-10-06 VITALS — BP 146/86 | HR 64 | Ht 66.0 in | Wt 154.0 lb

## 2013-10-06 DIAGNOSIS — N183 Chronic kidney disease, stage 3 unspecified: Secondary | ICD-10-CM

## 2013-10-06 DIAGNOSIS — E785 Hyperlipidemia, unspecified: Secondary | ICD-10-CM

## 2013-10-06 DIAGNOSIS — J449 Chronic obstructive pulmonary disease, unspecified: Secondary | ICD-10-CM

## 2013-10-06 DIAGNOSIS — I5032 Chronic diastolic (congestive) heart failure: Secondary | ICD-10-CM

## 2013-10-06 DIAGNOSIS — I10 Essential (primary) hypertension: Secondary | ICD-10-CM

## 2013-10-06 DIAGNOSIS — E46 Unspecified protein-calorie malnutrition: Secondary | ICD-10-CM | POA: Insufficient documentation

## 2013-10-06 DIAGNOSIS — I4891 Unspecified atrial fibrillation: Secondary | ICD-10-CM

## 2013-10-06 LAB — CBC WITH DIFFERENTIAL/PLATELET
BASOS ABS: 0 10*3/uL (ref 0.0–0.1)
BASOS PCT: 0 % (ref 0–1)
EOS PCT: 1 % (ref 0–5)
Eosinophils Absolute: 0.1 10*3/uL (ref 0.0–0.7)
HCT: 33.3 % — ABNORMAL LOW (ref 39.0–52.0)
Hemoglobin: 11 g/dL — ABNORMAL LOW (ref 13.0–17.0)
Lymphocytes Relative: 10 % — ABNORMAL LOW (ref 12–46)
Lymphs Abs: 1.3 10*3/uL (ref 0.7–4.0)
MCH: 30.5 pg (ref 26.0–34.0)
MCHC: 33 g/dL (ref 30.0–36.0)
MCV: 92.2 fL (ref 78.0–100.0)
MONO ABS: 0.7 10*3/uL (ref 0.1–1.0)
Monocytes Relative: 5 % (ref 3–12)
Neutro Abs: 10.6 10*3/uL — ABNORMAL HIGH (ref 1.7–7.7)
Neutrophils Relative %: 84 % — ABNORMAL HIGH (ref 43–77)
Platelets: 230 10*3/uL (ref 150–400)
RBC: 3.61 MIL/uL — ABNORMAL LOW (ref 4.22–5.81)
RDW: 15.1 % (ref 11.5–15.5)
WBC: 12.7 10*3/uL — ABNORMAL HIGH (ref 4.0–10.5)

## 2013-10-06 NOTE — Patient Instructions (Signed)
Your physician recommends that you continue on your current medications as directed. Please refer to the Current Medication list given to you today.  Your physician recommends that you schedule a follow-up appointment in: 3-4 WEEKS WITH DR. Patty SermonsBRACKBILL; IF NOT AVAILABLE THEN WITH SCOTT WEAVER, PAC SAME DAY DR. Patty SermonsBRACKBILL IS IN THE OFFICE PER SCOTT WEAVER, PAC

## 2013-10-06 NOTE — Progress Notes (Signed)
[address], [place], [place]  [address] Phone: [phone] Fax:  [phone]  Date:  10/06/2013   ID:  Slayton, Lubitz [birth year], MRN [id number]  PCP:  Odette Fraction, MD  Cardiologist:  Dr. Darlin Coco     History of Present Illness: IRISH PIECH is a 68 y.o. male with the history of HTN, HL, CKD, AAA status post repair in 09/2012. Myoview (09/2012):  EF 59%, no scar or ischemia.    He was initially seen by cardiology during an admission in 07/2013 for acute exacerbation of COPD complicated by atrial fibrillation with RVR.  CHADS2-VASc=3 (age, HTN, vascular disease).  He was placed on Coumadin and rate control. Plan was to proceed with DCCV should he remain in atrial fibrillation.  Echo (08/13/2013): EF 55-60%, normal wall motion, mild MR, mild LAE.  He was readmitted [birth year] with a/c diastolic CHF. He had worsening creatinine with diuresis. Coumadin was switched to Xarelto. He was also placed on amiodarone. He was discharged with plans to proceed with DCCV in approximately 3 weeks. He was discharged off of Lasix due to worsening renal function.  He was then readmitted 6/5-4/65 with a/c diastolic CHF and persistent atrial fibrillation. He again had worsening renal function with diuresis. Lasix had to be held.  He underwent TEE guided DCCV 09/22/2013. However, he had recurrent atrial fibrillation with RVR. Amiodarone dose was adjusted. He underwent repeat cardioversion 09/28/13.  He remained in NSR at discharge.  He was again not sent home on Lasix due to worsening renal function.  Of note, he saw his PCP yesterday who resumed his Lasix due to increased dyspnea and edema.   The patient is here with his wife and granddaughter.  His O2 has been increased to 3 LPM and his O2 levels were 91% this AM.  His weights have been steadily decreasing.  His LE edema is better than yesterday since resuming Lasix.  He feels like his breathing is better.  He is still NYHA Class 3.  He  denies chest pain, syncope, near syncope, PND.  He does sleep on an incline since his initial presentation to the hospital in 07/2013.     Recent Labs: 04/13/2013: HDL Cholesterol 35*; LDL (calc) 61  08/13/2013: TSH 0.389  09/19/2013: Pro B Natriuretic peptide (BNP) 6856.0*  10/05/2013: ALT 27; Creatinine 2.02*; Hemoglobin 11.0*; Potassium 5.5*  10/05/2013: Albumin 2.9*   Wt Readings from Last 3 Encounters:  10/06/13 154 lb (69.854 kg)  10/05/13 156 lb (70.761 kg)  09/29/13 151 lb (68.493 kg)     Past Medical History  Diagnosis Date  . GERD (gastroesophageal reflux disease)   . Hyperlipidemia   . H/O hiatal hernia   . Hypertension     Sees Dr. Jenna Luo  . AAA (abdominal aortic aneurysm)   . COPD (chronic obstructive pulmonary disease)   . Diastolic heart failure     a.  Echo (08/13/2013): EF 55-60%, normal wall motion, mild MR, mild LAE  . Pneumonia   . Arthritis     "little in my hands" (09/08/2013)  . Chronic kidney disease (CKD), stage III (moderate)   . Atrial fibrillation     a. amiodarone Rx started 08/2013;  b. s/p TEE-DCCV 09/2013 => recurrent AFib => repeat DCCV => NSR;   c. Xarelto 15 QD due to CKD  . Hx of cardiovascular stress test     a. Myoview 09/2012:  no scar or ischemia    Current Outpatient Prescriptions  Medication Sig Dispense Refill  . albuterol (PROVENTIL HFA;VENTOLIN HFA) 108 (90 BASE) MCG/ACT inhaler Inhale 2 puffs into the lungs every 6 (six) hours as needed for wheezing or shortness of breath.  1 Inhaler  2  . amiodarone (PACERONE) 200 MG tablet Take 1 tablet (200 mg total) by mouth 2 (two) times daily.  30 tablet  0  . atorvastatin (LIPITOR) 40 MG tablet Take 1 tablet (40 mg total) by mouth daily.  30 tablet  5  . diltiazem (CARDIZEM CD) 300 MG 24 hr capsule Take 1 capsule (300 mg total) by mouth daily.  30 capsule  0  . fluticasone-salmeterol (ADVAIR HFA) 115-21 MCG/ACT inhaler Inhale 2 puffs into the lungs 2 (two) times daily.  1 Inhaler   12  . glucose blood test strip Check BS TID Dx - 250.00  100 each  12  . glucose monitoring kit (FREESTYLE) monitoring kit 1 each by Does not apply route as needed for other. DX - 250.00  1 each  0  . insulin detemir (LEVEMIR) 100 UNIT/ML injection Inject 0.1 mLs (10 Units total) into the skin at bedtime.  10 mL  11  . Lancets (FREESTYLE) lancets Check BS TID  Dx - 250.00  100 each  12  . linagliptin (TRADJENTA) 5 MG TABS tablet Take 1 tablet (5 mg total) by mouth daily.  30 tablet  3  . metoprolol succinate (TOPROL-XL) 25 MG 24 hr tablet Take 3 tablets (75 mg total) by mouth daily after breakfast. Take with or immediately following a meal.  90 tablet  0  . omeprazole (PRILOSEC) 40 MG capsule Take 1 capsule (40 mg total) by mouth 2 (two) times daily.  60 capsule  1  . Rivaroxaban (XARELTO) 15 MG TABS tablet Take 1 tablet (15 mg total) by mouth daily with supper.  30 tablet  0   No current facility-administered medications for this visit.    Allergies:   Niaspan   Social History:  The patient  reports that he has quit smoking. His smoking use included Cigarettes. He has a 56 pack-year smoking history. He has quit using smokeless tobacco. His smokeless tobacco use included Chew. He reports that he does not drink alcohol or use illicit drugs.   Family History:  The patient's family history includes Diabetes in his mother; Heart attack in his father; Heart disease in his father.   ROS:  Please see the history of present illness.   He has a chronic cough.  No melena, hematochezia, hematuria.   All other systems reviewed and negative.   PHYSICAL EXAM: VS:  BP 146/86  Pulse 64  Ht _0  (1.676 m)  Wt 154 lb (69.854 kg)  BMI 24.87 kg/m2 Well nourished, well developed, in no acute distress HEENT: normal Neck: no JVD Cardiac:  normal S1, S2; RRR; no murmur Lungs:  Decreased breath sounds, no wheezing or rales Abd: soft, nontender, no hepatomegaly Ext: 1 + bilateral ankle edema Skin: warm and  dry Neuro:  CNs 2-12 intact, no focal abnormalities noted  EKG:  NSR, HR 64, normal axis, no ST changes, QTc 441     ASSESSMENT AND PLAN:  1. Atrial Fibrillation:  Maintaining NSR.  Continue current dose of Amiodarone and Xarelto.  Recent Hgb stable.  Dr. Dennard Schaumann plans to check LFTs and TSH soon.   2. Chronic Diastolic CHF:  Volume appears stable.  Agree with continuing Lasix for now.  Dr. Dennard Schaumann plans to recheck his BMET later this week.  3. Chronic Kidney Disease:  Recent creatinine stable.  Repeat BMET later this week per primary care. 4. COPD:  Patient remains on O2 at 3 LPM.  Continue f/u with primary care.  5. Protein Calorie Malnutrition:  This is likely contributing to his edema as well.  We discussed increasing protein in his diet or adding Ensure/Glucerna.   6. Hypertension:  Fair control.  Continue current Rx.  7. Hyperlipidemia:  Managed by primary care.  Continue statin.  8. Disposition:  Follow up with [facility] TOM, MD this week as planned.  F/u with Dr. Darlin Coco or me in 3-4 weeks.   Signed, Richardson Dopp, PA-C  10/06/2013 12:25 PM

## 2013-10-08 ENCOUNTER — Encounter: Payer: Self-pay | Admitting: Family Medicine

## 2013-10-08 ENCOUNTER — Ambulatory Visit (INDEPENDENT_AMBULATORY_CARE_PROVIDER_SITE_OTHER): Payer: Medicare Other | Admitting: Family Medicine

## 2013-10-08 VITALS — BP 138/68 | HR 66 | Temp 97.0°F | Resp 20 | Ht 66.5 in | Wt 152.0 lb

## 2013-10-08 DIAGNOSIS — R0989 Other specified symptoms and signs involving the circulatory and respiratory systems: Secondary | ICD-10-CM

## 2013-10-08 DIAGNOSIS — R0600 Dyspnea, unspecified: Secondary | ICD-10-CM

## 2013-10-08 DIAGNOSIS — R0609 Other forms of dyspnea: Secondary | ICD-10-CM

## 2013-10-08 DIAGNOSIS — N184 Chronic kidney disease, stage 4 (severe): Secondary | ICD-10-CM

## 2013-10-08 DIAGNOSIS — J441 Chronic obstructive pulmonary disease with (acute) exacerbation: Secondary | ICD-10-CM

## 2013-10-08 NOTE — Progress Notes (Signed)
Subjective:    Patient ID: Mark [name], male    DOB: Jul 21, 1946, 68 y.o.   MRN: [id number]  HPI 10/05/13 Patient was admitted to the hospital with CHF and COPD exacerbation in early January.  I copied the relevant portions of his discharge summary and included them below: Admit date: 09/19/2013  Discharge date: 09/29/2013  Time spent: 45 minutes  Recommendations for Outpatient Follow-up:  1. Needs daily weights with scale 2. Needs diabetic education and has been given a meter 3. Needs close Cardiology f/u 4. Needs complete metabolic panel as well as TSH in about 4-5 days 5. Recommend outpatient diabetic teaching 6. Please institute other sulfonylurea if you see fit as an outpatient-he had steroids during this hospital stay so I don't think he qualifies truly is being a diabetic 7. Patient has not been given Lasix on discharge as he had mild renal insufficiencyand a contraction alkalosis. If he gains 2-3 pounds of weight or come swollen however, he will need followup and will need Lasix 8. He is to followup with his cardiologist soon-tapering doses of amiodarone have prescribed for him Discharge Diagnoses:  Active Problems:  HTN (hypertension)  Atrial fibrillation  Long term (current) use of anticoagulants  Acute on chronic diastolic CHF (congestive heart failure)  COPD (chronic obstructive pulmonary disease)  Acute diastolic heart failure  Respiratory failure  Chronic kidney disease (CKD), stage III (moderate)  Discharge Condition: stable  Diet recommendation: heart healthy low-salt  Filed Weights    09/27/13 0544  09/28/13 0500  09/29/13 0447   Weight:  69.8 kg (153 lb 14.1 oz)  68.221 kg (150 lb 6.4 oz)  68.493 kg (151 lb)   History of present illness:  68 y.o. ? known COPD [unclear stage], stage III chronic kidney disease, atrial fibrillation, diastolic congestive heart failure[Nuc stress 10/09/12=[id number]%, Nl L wall motion] , AAA repair 10/15/12 c multiple hospitalizations  recently. Admit on 09/08/2013 discharged on 09/12/2013 at which time he presented with acute on chronic diastolic congestive heart failure. creatinine peaked at 3.49 on 09/11/2013. ?  diuretics were held on d/c. Snce discharge he has had progressive dyspnea at rest, cough associated with white sputum production, wheezing, worsening of bilateral lower extremity edema, 6 pound weight gain in 1 week. He denies chest pain, fevers chills, nausea or vomiting, abdominal pain, syncope, presyncope, bloody stools.  ED work-up= BNP of 6856. EKG showed atrial fibrillation. patient noted to be an respiratory distress and was administered supplemental oxygen via nonrebreather.  He was cardioverted with DC cardioversion on 09/23/13 but then reverted to A. fib at 115 to 120 range  Cardiology managing currently plan is for Dc cardioversion 1/12  Hospital Course:  Acute on chronic diastolic congestive heart failure.  - worsening dyspnea at rest, 6 pound weight gain, increasing bilateral extremity pitting edema.  - 8.06 L since admission. Discharge weight last hospital stay 154, current weight 153 on the  -IV Lasix was completely discontinued this hospital stay  -He will need close outpatient followup with cardiology  Atrial fibrillation, CHad2Vasc2 score=2-3. S/p direct cardioversion 150 J 09/22/13  -Cont amiodarone 200 mg by mouth twice a day-tapered to 200 mg once daily on 1/17  Continue metoprolol 75 mg daily and Cardizem 300 mg by mouth daily.  -Needs TSH, LFTs in 4-6 weeks as is on chronic amiodarone  -Placed on Xarelto 15 mg daily due to renal fnxn.  -Cardioversion performed on 1/8, 1/12, currently in sinus rhythm  Multifactorial acute respiratory failure,  -likely secondary  to combined congestive heart failure and COPD exacerbation.  -Azithromycin 1/4-1/8  -Afebrile, leukocytosis 2/2 prednisone  Potential pneumonia  -CXR with left base opacification, Azithro discontinued 1/8  -1/9 repeat CXR  =effusions Chronic COPD, unclear as to GOLD stage  -Placed on IV steroids on admission, rapid taper to off 09/25/13  Mild metabolic alkalosis  -labs stable  Stage III chronic kidney disease.  -Patient's baseline creatinine near 2.3, 2.4. He presently appears to be at his baseline.  -if creatinine worsens, need discussion regarding this with cardiologist and potential renal consult-cardiorenal syndrome  Mild-mod mitral regurgitation  -monitor  Hypertension  - Continue Cardizem and metoprolol therapy. Nutrition.  Hyperlipidemia  -continue atorvastatin 40 daily  Hyperglycemia , steroid-induced-Would NOT term as DM right now  -Continue diabetic diet  -Worsened by steroid-induced hyperglycemia  -metformin contraindicated given renal function  -Started Tradjenta this admission  -Started Levemir 7 units---> 10 units  -Eating 100% of meals  -will need extensive teaching, appreciate dietitian input  Normocytic anemia  -Monitor as an outpatient as is on anticoagulation  Consultants:  Cardiology Procedures:  Cardioversion 1/7, 1/12  ECHO 1/6 EF 55-50   Patient states that his breathing has worsened slightly since coming home from the hospital. He is currently satting 87% on 3 L nasal cannula. His weight is 156 pounds today in clinic. He was 151 when he was discharged from the hospital. He has +1 pedal edema in both legs. I could also appreciate some bi-basilar crackles. He is not currently taking any diuretic. He is also wheezing slightly on exam. He has started on Advair since discharge from the hospital although he cannot tell if the medicine seems to be helping.  His blood sugars are ranging 98-200. He does not have a history of diabetes. His pressure has always been well controlled in this clinic. I believe the hyperglycemia is likely due to steroids and not true diabetes. He has not had a hemoglobin A1c.  At that time, my plan was: 1. Hospital discharge follow-up I do not feel that the  patient is truly a diabetic. I believe his hyperglycemia is likely due to steroids used to treat his COPD. I have asked the patient to discontinue insulin. I will continue Tradjenta temporarily.  I have asked the patient to monitor fasting blood sugars and two-hour postprandial sugars over the next week. If his sugars continue to improve as he is home from the hospital I will likely then discontinue all his diabetic medication.  I believe the shortness of breath is likely a combination of his COPD and some mild pulmonary edema that has developed since he discontinued his diuretic upon discharge from the hospital. I have asked the patient to resume Lasix 40 mg by mouth daily for the next 3 days. I will see him back in clinic on Thursday. At that time we'll assess whether not the patient needs to continue a daily diuretic or resume PRN lasix. I have also asked the patient to discontinue Advair and I will replace it with Symbicort 160/4.5, 2 puffs inhaled twice a day. - Hemoglobin A1c  2. Atrial fibrillation Currently in normal sinus rhythm. On recheck a TSH as well as liver function tests in 4 weeks. Continue xarelto.    3. COPD exacerbation Continue oxygen, start symbicort 160/4.5 2 puff inh bid and discontinue advair.  4. CKD (chronic kidney disease), stage III I will recheck the patient in 3 days. We may need to discontinue Lasix at that time if the patient is  beginning to develop signs of worsening renal insufficiency.  10/08/13 Patient was 156 pounds here 3 days ago. After being on Lasix the last 3 days his weight has fallen to 151 pounds on my scale.  He is 148 pounds by his scale at home.  Of note his hemoglobin A1c returned at 7.6 indicating that the patient has had hyperglycemia for at least the last 3 months.  Although the patient's breathing is better he is still requiring oxygen. He is still satting 85-90% on 2 L via nasal cannula. He gets very winded with any exertion. On examination today his  lungs have some faint bibasilar crackles which are noncompressive. He does have significant pretibial pitting edema. However I do not believe that the patient's fluid overload is the cause of his dyspnea. He is still wheezing on examination. He does feel better since starting the Symbicort.  He is also using albuterol 2 puffs inhaled every 6 hours as needed for wheezing. - COMPLETE METABOLIC PANEL WITH GFR Past Medical History  Diagnosis Date  . GERD (gastroesophageal reflux disease)   . Hyperlipidemia   . H/O hiatal hernia   . Hypertension     Sees Dr. Lynnea Ferrier  . AAA (abdominal aortic aneurysm)   . COPD (chronic obstructive pulmonary disease)   . Diastolic heart failure     a.  Echo (08/13/2013): EF 55-60%, normal wall motion, mild MR, mild LAE  . Pneumonia   . Arthritis     "little in my hands" (09/08/2013)  . Chronic kidney disease (CKD), stage III (moderate)   . Atrial fibrillation     a. amiodarone Rx started 08/2013;  b. s/p TEE-DCCV 09/2013 => recurrent AFib => repeat DCCV => NSR;   c. Xarelto 15 QD due to CKD  . Hx of cardiovascular stress test     a. Myoview 09/2012:  no scar or ischemia   Past Surgical History  Procedure Laterality Date  . Knee arthroscopy Right   . Abdominal aortic aneurysm repair  10/15/2012    Procedure: ANEURYSM ABDOMINAL AORTIC REPAIR;  Surgeon: Pryor Ochoa, MD;  Location: [facility];  Service: Vascular;  Laterality: N/A;  Resection and Grafting of Abdominal Aortic Aneurysm - Aortobi-Iliac   . Tee without cardioversion N/A 09/22/2013    Procedure: TRANSESOPHAGEAL ECHOCARDIOGRAM (TEE);  Surgeon: Lars Masson, MD;  Location: [facility] ENDOSCOPY;  Service: Cardiovascular;  Laterality: N/A;  . Cardioversion N/A 09/22/2013    Procedure: CARDIOVERSION;  Surgeon: Lars Masson, MD;  Location: [facility] ENDOSCOPY;  Service: Cardiovascular;  Laterality: N/A;   Current Outpatient Prescriptions on File Prior to Visit  Medication Sig Dispense Refill  . albuterol  (PROVENTIL HFA;VENTOLIN HFA) 108 (90 BASE) MCG/ACT inhaler Inhale 2 puffs into the lungs every 6 (six) hours as needed for wheezing or shortness of breath.  1 Inhaler  2  . amiodarone (PACERONE) 200 MG tablet Take 200 mg by mouth daily.      Marland Kitchen atorvastatin (LIPITOR) 40 MG tablet Take 1 tablet (40 mg total) by mouth daily.  30 tablet  5  . budesonide-formoterol (SYMBICORT) 160-4.5 MCG/ACT inhaler Inhale 2 puffs into the lungs 2 (two) times daily.      Marland Kitchen diltiazem (CARDIZEM CD) 300 MG 24 hr capsule Take 1 capsule (300 mg total) by mouth daily.  30 capsule  0  . furosemide (LASIX) 40 MG tablet 1 tab daily      . glucose blood test strip Check BS TID Dx - 250.00  100 each  12  . linagliptin (TRADJENTA) 5 MG TABS tablet Take 1 tablet (5 mg total) by mouth daily.  30 tablet  3  . metoprolol succinate (TOPROL-XL) 25 MG 24 hr tablet Take 3 tablets (75 mg total) by mouth daily after breakfast. Take with or immediately following a meal.  90 tablet  0  . omeprazole (PRILOSEC) 40 MG capsule Take 1 capsule (40 mg total) by mouth 2 (two) times daily.  60 capsule  1  . Rivaroxaban (XARELTO) 15 MG TABS tablet Take 1 tablet (15 mg total) by mouth daily with supper.  30 tablet  0   No current facility-administered medications on file prior to visit.   Allergies  Allergen Reactions  . Niaspan [Niacin Er] Anaphylaxis   History   Social History  . Marital Status: Married    Spouse Name: Nicole Cella    Number of Children: 4  . Years of Education: N/A   Occupational History  . Works PT as a Education administrator    Social History Main Topics  . Smoking status: Former Smoker -- 1.00 packs/day for 56 years    Types: Cigarettes  . Smokeless tobacco: Former Neurosurgeon    Types: Chew     Comment: 09/08/2013 "stopped smoking cigarettes ~ 2 months ago; aien't chew in probably 10 yrs"  . Alcohol Use: No  . Drug Use: No  . Sexual Activity: Not Currently   Other Topics Concern  . Not on file   Social History Narrative   Married.   Lives with wife.  Ambulates without assistance.     Review of Systems  All other systems reviewed and are negative.       Objective:   Physical Exam  Vitals reviewed. Constitutional: He appears well-developed and well-nourished. No distress.  Cardiovascular: Normal rate, regular rhythm, normal heart sounds and intact distal pulses.  Exam reveals no gallop and no friction rub.   No murmur heard. Pulmonary/Chest: Effort normal. He has decreased breath sounds. He has wheezes. He has rales in the right lower field and the left lower field.  Abdominal: Soft. Bowel sounds are normal. He exhibits no distension and no mass. There is no tenderness. There is no rebound and no guarding.  Musculoskeletal: He exhibits edema.  Skin: Skin is warm. No rash noted. He is not diaphoretic. No erythema. No pallor.          Assessment & Plan:  1. Chronic kidney disease (CKD), stage IV (severe) Patient has diuresed approximately 4 pounds. This seems to be some persistent edema. I like to continue the Lasix 40 mg by mouth daily for at least another 3 days. I then asked the patient to stop the medication. Meanwhile check a BMP to make sure that his potassium and his renal function has not deteriorated. - BASIC METABOLIC PANEL WITH GFR  2. COPD  Modest signs of peripheral edema, I believe the majority of the patient's dyspnea is now likely secondary to COPD. I want him to continue Symbicort. Today I added spiriva one inhalation by mouth daily when necessary recheck the patient in one week to assess its benefit on this new medication. Congratulating him off oxygen. 3. Dyspnea See prob #2

## 2013-10-09 LAB — BASIC METABOLIC PANEL WITH GFR
BUN: 32 mg/dL — AB (ref 6–23)
CALCIUM: 8.4 mg/dL (ref 8.4–10.5)
CO2: 27 mEq/L (ref 19–32)
Chloride: 100 mEq/L (ref 96–112)
Creat: 2.21 mg/dL — ABNORMAL HIGH (ref 0.50–1.35)
GFR, EST AFRICAN AMERICAN: 34 mL/min — AB
GFR, Est Non African American: 29 mL/min — ABNORMAL LOW
Glucose, Bld: 83 mg/dL (ref 70–99)
Potassium: 4.9 mEq/L (ref 3.5–5.3)
Sodium: 136 mEq/L (ref 135–145)

## 2013-10-13 ENCOUNTER — Emergency Department (HOSPITAL_COMMUNITY): Payer: Medicare Other

## 2013-10-13 ENCOUNTER — Inpatient Hospital Stay (HOSPITAL_COMMUNITY)
Admission: EM | Admit: 2013-10-13 | Discharge: 2013-10-17 | DRG: 291 | Disposition: A | Discharge status: Home Health | Payer: Medicare Other | Attending: Internal Medicine | Admitting: Internal Medicine

## 2013-10-13 ENCOUNTER — Encounter (HOSPITAL_COMMUNITY): Payer: Self-pay | Admitting: Emergency Medicine

## 2013-10-13 DIAGNOSIS — J9620 Acute and chronic respiratory failure, unspecified whether with hypoxia or hypercapnia: Secondary | ICD-10-CM | POA: Diagnosis present

## 2013-10-13 DIAGNOSIS — Z87891 Personal history of nicotine dependence: Secondary | ICD-10-CM

## 2013-10-13 DIAGNOSIS — E875 Hyperkalemia: Secondary | ICD-10-CM | POA: Diagnosis present

## 2013-10-13 DIAGNOSIS — I714 Abdominal aortic aneurysm, without rupture, unspecified: Secondary | ICD-10-CM | POA: Diagnosis present

## 2013-10-13 DIAGNOSIS — Z7901 Long term (current) use of anticoagulants: Secondary | ICD-10-CM

## 2013-10-13 DIAGNOSIS — J4489 Other specified chronic obstructive pulmonary disease: Secondary | ICD-10-CM | POA: Diagnosis present

## 2013-10-13 DIAGNOSIS — Z9981 Dependence on supplemental oxygen: Secondary | ICD-10-CM

## 2013-10-13 DIAGNOSIS — I5033 Acute on chronic diastolic (congestive) heart failure: Principal | ICD-10-CM | POA: Diagnosis present

## 2013-10-13 DIAGNOSIS — Z888 Allergy status to other drugs, medicaments and biological substances status: Secondary | ICD-10-CM

## 2013-10-13 DIAGNOSIS — I10 Essential (primary) hypertension: Secondary | ICD-10-CM

## 2013-10-13 DIAGNOSIS — Z8249 Family history of ischemic heart disease and other diseases of the circulatory system: Secondary | ICD-10-CM

## 2013-10-13 DIAGNOSIS — I5031 Acute diastolic (congestive) heart failure: Secondary | ICD-10-CM

## 2013-10-13 DIAGNOSIS — J441 Chronic obstructive pulmonary disease with (acute) exacerbation: Secondary | ICD-10-CM

## 2013-10-13 DIAGNOSIS — N183 Chronic kidney disease, stage 3 unspecified: Secondary | ICD-10-CM

## 2013-10-13 DIAGNOSIS — J449 Chronic obstructive pulmonary disease, unspecified: Secondary | ICD-10-CM | POA: Diagnosis present

## 2013-10-13 DIAGNOSIS — E119 Type 2 diabetes mellitus without complications: Secondary | ICD-10-CM | POA: Diagnosis present

## 2013-10-13 DIAGNOSIS — I509 Heart failure, unspecified: Secondary | ICD-10-CM

## 2013-10-13 DIAGNOSIS — Z79899 Other long term (current) drug therapy: Secondary | ICD-10-CM

## 2013-10-13 DIAGNOSIS — I4891 Unspecified atrial fibrillation: Secondary | ICD-10-CM | POA: Diagnosis present

## 2013-10-13 DIAGNOSIS — I129 Hypertensive chronic kidney disease with stage 1 through stage 4 chronic kidney disease, or unspecified chronic kidney disease: Secondary | ICD-10-CM | POA: Diagnosis present

## 2013-10-13 DIAGNOSIS — Z833 Family history of diabetes mellitus: Secondary | ICD-10-CM

## 2013-10-13 DIAGNOSIS — E785 Hyperlipidemia, unspecified: Secondary | ICD-10-CM | POA: Diagnosis present

## 2013-10-13 DIAGNOSIS — K219 Gastro-esophageal reflux disease without esophagitis: Secondary | ICD-10-CM | POA: Diagnosis present

## 2013-10-13 LAB — POCT I-STAT TROPONIN I: Troponin i, poc: 0 ng/mL (ref 0.00–0.08)

## 2013-10-13 LAB — CBC
HCT: 33.9 % — ABNORMAL LOW (ref 39.0–52.0)
Hemoglobin: 11.3 g/dL — ABNORMAL LOW (ref 13.0–17.0)
MCH: 31.3 pg (ref 26.0–34.0)
MCHC: 33.3 g/dL (ref 30.0–36.0)
MCV: 93.9 fL (ref 78.0–100.0)
PLATELETS: 261 10*3/uL (ref 150–400)
RBC: 3.61 MIL/uL — ABNORMAL LOW (ref 4.22–5.81)
RDW: 14.3 % (ref 11.5–15.5)
WBC: 6.9 10*3/uL (ref 4.0–10.5)

## 2013-10-13 LAB — BASIC METABOLIC PANEL
BUN: 23 mg/dL (ref 6–23)
BUN: 23 mg/dL (ref 6–23)
CALCIUM: 8.8 mg/dL (ref 8.4–10.5)
CO2: 29 mEq/L (ref 19–32)
CO2: 28 meq/L (ref 19–32)
CREATININE: 2.03 mg/dL — AB (ref 0.50–1.35)
Calcium: 9.2 mg/dL (ref 8.4–10.5)
Chloride: 99 mEq/L (ref 96–112)
Chloride: 99 mEq/L (ref 96–112)
Creatinine, Ser: 2.09 mg/dL — ABNORMAL HIGH (ref 0.50–1.35)
GFR calc Af Amer: 37 mL/min — ABNORMAL LOW (ref >90)
GFR calc non Af Amer: 32 mL/min — ABNORMAL LOW (ref >90)
GFR, EST AFRICAN AMERICAN: 36 mL/min — AB (ref >90)
GFR, EST NON AFRICAN AMERICAN: 31 mL/min — AB (ref >90)
Glucose, Bld: 108 mg/dL — ABNORMAL HIGH (ref 70–99)
Glucose, Bld: 116 mg/dL — ABNORMAL HIGH (ref 70–99)
Potassium: 6 mEq/L — ABNORMAL HIGH (ref 3.7–5.3)
Potassium: 5.7 mEq/L — ABNORMAL HIGH (ref 3.7–5.3)
Sodium: 140 mEq/L (ref 137–147)
Sodium: 138 mEq/L (ref 137–147)

## 2013-10-13 LAB — PRO B NATRIURETIC PEPTIDE: PRO B NATRI PEPTIDE: 4757 pg/mL — AB (ref 0–125)

## 2013-10-13 MED ORDER — SODIUM CHLORIDE 0.9 % IJ SOLN
3.0000 mL | Freq: Two times a day (BID) | INTRAMUSCULAR | Status: DC
  Administered 2013-10-14 – 2013-10-17 (×7): 3 mL via INTRAVENOUS

## 2013-10-13 MED ORDER — ONDANSETRON HCL 4 MG PO TABS: 4.0000 mg | ORAL_TABLET | Freq: Four times a day (QID) | ORAL | Status: DC | PRN

## 2013-10-13 MED ORDER — FUROSEMIDE 10 MG/ML IJ SOLN
40.0000 mg | Freq: Once | INTRAMUSCULAR | Status: DC
  Filled 2013-10-13: qty 4

## 2013-10-13 MED ORDER — NITROGLYCERIN 2 % TD OINT
1.0000 [in_us] | TOPICAL_OINTMENT | Freq: Once | TRANSDERMAL | Status: AC
  Administered 2013-10-13: 1 [in_us] via TOPICAL
  Filled 2013-10-13: qty 1

## 2013-10-13 MED ORDER — FUROSEMIDE 10 MG/ML IJ SOLN
40.0000 mg | Freq: Once | INTRAMUSCULAR | Status: AC
  Administered 2013-10-13: 40 mg via INTRAVENOUS
  Filled 2013-10-13: qty 4

## 2013-10-13 MED ORDER — ATORVASTATIN CALCIUM 40 MG PO TABS
40.0000 mg | ORAL_TABLET | Freq: Every day | ORAL | Status: DC
  Administered 2013-10-14 – 2013-10-17 (×4): 40 mg via ORAL
  Filled 2013-10-13 (×5): qty 1

## 2013-10-13 MED ORDER — ONDANSETRON HCL 4 MG/2ML IJ SOLN
4.0000 mg | Freq: Three times a day (TID) | INTRAMUSCULAR | Status: AC | PRN
  Administered 2013-10-13: 4 mg via INTRAVENOUS
  Filled 2013-10-13: qty 2

## 2013-10-13 MED ORDER — DILTIAZEM HCL ER COATED BEADS 300 MG PO CP24
300.0000 mg | ORAL_CAPSULE | Freq: Every day | ORAL | Status: DC
  Administered 2013-10-14 – 2013-10-17 (×4): 300 mg via ORAL
  Filled 2013-10-13 (×5): qty 1

## 2013-10-13 MED ORDER — INSULIN ASPART 100 UNIT/ML ~~LOC~~ SOLN
0.0000 [IU] | Freq: Three times a day (TID) | SUBCUTANEOUS | Status: DC
  Administered 2013-10-15: 1 [IU] via SUBCUTANEOUS

## 2013-10-13 MED ORDER — ONDANSETRON HCL 4 MG/2ML IJ SOLN: 4.0000 mg | Freq: Four times a day (QID) | INTRAMUSCULAR | Status: DC | PRN

## 2013-10-13 MED ORDER — RIVAROXABAN 15 MG PO TABS
15.0000 mg | ORAL_TABLET | Freq: Every day | ORAL | Status: DC
  Administered 2013-10-14 – 2013-10-16 (×3): 15 mg via ORAL
  Filled 2013-10-13 (×5): qty 1

## 2013-10-13 MED ORDER — LINAGLIPTIN 5 MG PO TABS
5.0000 mg | ORAL_TABLET | Freq: Every day | ORAL | Status: DC
  Administered 2013-10-14 – 2013-10-17 (×4): 5 mg via ORAL
  Filled 2013-10-13 (×5): qty 1

## 2013-10-13 MED ORDER — TIOTROPIUM BROMIDE MONOHYDRATE 18 MCG IN CAPS
18.0000 ug | ORAL_CAPSULE | Freq: Every day | RESPIRATORY_TRACT | Status: DC
  Administered 2013-10-14 – 2013-10-17 (×4): 18 ug via RESPIRATORY_TRACT
  Filled 2013-10-13: qty 5

## 2013-10-13 MED ORDER — METOPROLOL SUCCINATE ER 50 MG PO TB24
75.0000 mg | ORAL_TABLET | Freq: Every day | ORAL | Status: DC
  Administered 2013-10-14 – 2013-10-17 (×4): 75 mg via ORAL
  Filled 2013-10-13 (×5): qty 1

## 2013-10-13 MED ORDER — ALBUTEROL SULFATE (2.5 MG/3ML) 0.083% IN NEBU: 3.0000 mL | INHALATION_SOLUTION | Freq: Four times a day (QID) | RESPIRATORY_TRACT | Status: DC | PRN

## 2013-10-13 MED ORDER — AMIODARONE HCL 200 MG PO TABS
200.0000 mg | ORAL_TABLET | Freq: Every day | ORAL | Status: DC
  Administered 2013-10-14 – 2013-10-17 (×4): 200 mg via ORAL
  Filled 2013-10-13 (×5): qty 1

## 2013-10-13 MED ORDER — BUDESONIDE-FORMOTEROL FUMARATE 160-4.5 MCG/ACT IN AERO
2.0000 | INHALATION_SPRAY | Freq: Two times a day (BID) | RESPIRATORY_TRACT | Status: DC
  Administered 2013-10-14 – 2013-10-17 (×6): 2 via RESPIRATORY_TRACT
  Filled 2013-10-13: qty 6

## 2013-10-13 MED ORDER — INSULIN ASPART 100 UNIT/ML ~~LOC~~ SOLN: 0.0000 [IU] | Freq: Every day | SUBCUTANEOUS | Status: DC

## 2013-10-13 MED ORDER — PANTOPRAZOLE SODIUM 40 MG PO TBEC
40.0000 mg | DELAYED_RELEASE_TABLET | Freq: Every day | ORAL | Status: DC
  Administered 2013-10-14 – 2013-10-17 (×4): 40 mg via ORAL
  Filled 2013-10-13 (×4): qty 1

## 2013-10-13 NOTE — ED Notes (Signed)
Patient transported to X-ray 

## 2013-10-13 NOTE — ED Notes (Signed)
Transporting patient to new room assignment. 

## 2013-10-13 NOTE — ED Provider Notes (Signed)
CSN: [id number]     Arrival date & time 10/13/13  1657 History   First MD Initiated Contact with Patient 10/13/13 1822     Chief Complaint  Patient presents with  . Shortness of Breath   (Consider location/radiation/quality/duration/timing/severity/associated sxs/prior Treatment) Patient is a 68 y.o. male presenting with shortness of breath. The history is provided by the patient and medical records.  Shortness of Breath Severity:  Severe Onset quality:  Gradual Duration:  3 days Timing:  Constant Progression:  Worsening Chronicity:  Recurrent Context: activity   Context: not emotional upset, not known allergens, not occupational exposure and not pollens   Relieved by:  Nothing Worsened by:  Nothing tried Ineffective treatments:  None tried Associated symptoms: chest pain and PND   Associated symptoms: no abdominal pain, no fever, no headaches, no hemoptysis, no rash, no sputum production, no syncope, no swollen glands and no vomiting   Risk factors: no hx of cancer, no prolonged immobilization and no recent surgery     Past Medical History  Diagnosis Date  . GERD (gastroesophageal reflux disease)   . Hyperlipidemia   . H/O hiatal hernia   . Hypertension     Sees Dr. Lynnea FerrierWarren Pickard  . AAA (abdominal aortic aneurysm)   . COPD (chronic obstructive pulmonary disease)   . Diastolic heart failure     a.  Echo (08/13/2013): EF 55-60%, normal wall motion, mild MR, mild LAE  . Pneumonia   . Arthritis     "little in my hands" (09/08/2013)  . Chronic kidney disease (CKD), stage III (moderate)   . Atrial fibrillation     a. amiodarone Rx started 08/2013;  b. s/p TEE-DCCV 09/2013 => recurrent AFib => repeat DCCV => NSR;   c. Xarelto 15 QD due to CKD  . Hx of cardiovascular stress test     a. Myoview 09/2012:  no scar or ischemia   Past Surgical History  Procedure Laterality Date  . Knee arthroscopy Right   . Abdominal aortic aneurysm repair  10/15/2012    Procedure: ANEURYSM  ABDOMINAL AORTIC REPAIR;  Surgeon: Pryor OchoaJames D Lawson, MD;  [facility];  Service: Vascular;  Laterality: N/A;  Resection and Grafting of Abdominal Aortic Aneurysm - Aortobi-Iliac   . Tee without cardioversion N/A 09/22/2013    Procedure: TRANSESOPHAGEAL ECHOCARDIOGRAM (TEE);  Surgeon: Lars MassonKatarina H Nelson, MD;  [facility];  Service: Cardiovascular;  Laterality: N/A;  . Cardioversion N/A 09/22/2013    Procedure: CARDIOVERSION;  Surgeon: Lars MassonKatarina H Nelson, MD;  [facility];  Service: Cardiovascular;  Laterality: N/A;   Family History  Problem Relation Age of Onset  . Diabetes Mother   . Heart disease Father   . Heart attack Father    History  Substance Use Topics  . Smoking status: Former Smoker -- 1.00 packs/day for 56 years    Types: Cigarettes  . Smokeless tobacco: Former NeurosurgeonUser    Types: Chew     Comment: 09/08/2013 "stopped smoking cigarettes ~ 2 months ago; aien't chew in probably 10 yrs"  . Alcohol Use: No    Review of Systems  Constitutional: Negative for fever and chills.  HENT: Negative for congestion and facial swelling.   Eyes: Negative for discharge and visual disturbance.  Respiratory: Positive for shortness of breath. Negative for hemoptysis and sputum production.   Cardiovascular: Positive for chest pain, leg swelling and PND. Negative for palpitations and syncope.  Gastrointestinal: Negative for vomiting, abdominal pain and diarrhea.  Musculoskeletal: Negative for arthralgias and  myalgias.  Skin: Negative for color change and rash.  Neurological: Negative for tremors, syncope and headaches.  Psychiatric/Behavioral: Negative for confusion and dysphoric mood.    Allergies  Niaspan  Home Medications   No current outpatient prescriptions on file. BP 140/73  Pulse 63  Temp(Src) 98.5 F (36.9 C) (Oral)  Resp 14  Ht 5' 6.5" (1.689 m)  Wt 147 lb 9.6 oz (66.951 kg)  BMI 23.47 kg/m2  SpO2 91% Physical Exam  Constitutional: He is oriented to  person, place, and time. He appears well-developed and well-nourished.  HENT:  Head: Normocephalic and atraumatic.  JVD to the angle of the jaw  Eyes: EOM are normal. Pupils are equal, round, and reactive to light.  Neck: Normal range of motion. Neck supple. No JVD present.  Cardiovascular: Normal rate and regular rhythm.  Exam reveals no gallop and no friction rub.   No murmur heard. No noted S3 on exam  Pulmonary/Chest: No respiratory distress. He has no wheezes. He has rales (and bilateral lung bases).  Abdominal: He exhibits no distension. There is no rebound and no guarding.  Musculoskeletal: Normal range of motion. He exhibits edema (4+ pedal edema).  Neurological: He is alert and oriented to person, place, and time.  Skin: No rash noted. No pallor.  Psychiatric: He has a normal mood and affect. His behavior is normal.    ED Course  Procedures (including critical care time) Labs Review Labs Reviewed  CBC - Abnormal; Notable for the following:    RBC 3.61 (*)    Hemoglobin 11.3 (*)    HCT 33.9 (*)    All other components within normal limits  BASIC METABOLIC PANEL - Abnormal; Notable for the following:    Potassium 6.0 (*)    Glucose, Bld 108 (*)    Creatinine, Ser 2.09 (*)    GFR calc non Af Amer 31 (*)    GFR calc Af Amer 36 (*)    All other components within normal limits  PRO B NATRIURETIC PEPTIDE - Abnormal; Notable for the following:    Pro B Natriuretic peptide (BNP) 4757.0 (*)    All other components within normal limits  BASIC METABOLIC PANEL - Abnormal; Notable for the following:    Potassium 5.7 (*)    Glucose, Bld 116 (*)    Creatinine, Ser 2.03 (*)    GFR calc non Af Amer 32 (*)    GFR calc Af Amer 37 (*)    All other components within normal limits  TROPONIN I  TROPONIN I  TROPONIN I  COMPREHENSIVE METABOLIC PANEL  CBC  POCT I-STAT TROPONIN I   Imaging Review Dg Chest 2 View  10/13/2013   CLINICAL DATA:  Shortness of breath, cough  EXAM: CHEST -  2 VIEW  COMPARISON:  09/25/2013  FINDINGS: Increase in bilateral pleural effusions. Worsening atelectasis/consolidation in the lung bases left greater than right. Patchy airspace opacities in the lingula and superior segment right lower lobe. No pneumothorax. Mild cardiomegaly. Regional bones unremarkable. Marland Kitchen  IMPRESSION: 1. Interval increase in pleural effusions and asymmetric airspace disease.   Electronically Signed   By: Oley Balm M.D.   On: 10/13/2013 18:36    EKG Interpretation    Date/Time:    Ventricular Rate:    PR Interval:    QRS Duration:   QT Interval:    QTC Calculation:   R Axis:     Text Interpretation:  MDM   1. CHF (congestive heart failure)   2. COPD (chronic obstructive pulmonary disease)    68 year old male with history of CHF comes in with a chief complaint shortness of breath. Patient was recently stopped his home Lasix about 4 days ago. Patient with a 3 pound weight gain since this progressive shortness of breath. Patient on 2 L of oxygen at home was hypoxic at home and increased to 3 L. Patient having worsening shortness of breath he called his PCP suggested he come to the emergency department for evaluation. Patient found to be hypoxic on 3 L placed on 6 L of oxygen with improvement into the low 90s.  Patient with fluid overloaded status probable CHF exacerbation we will admit to medicine.     Melene Plan, MD 10/14/13 [phone]

## 2013-10-13 NOTE — ED Notes (Signed)
Pt states increase in shortness of breath and bilateral leg swelling x 3 days.  Pitting edema +3 to bilateral legs. States recently stopped lasix due to issues with kidneys.

## 2013-10-13 NOTE — ED Notes (Signed)
Pt reports sob that started yesterday, having productive cough and swelling to extremities. spo2 97% on 4L [place].

## 2013-10-13 NOTE — H&P (Signed)
[facility] History and Physical  Patient: Mark [name]  ZOX:[id number]  DOB: [birth year]  DOS: the patient was seen and examined on 10/13/2013 PCP: Leo Grosser, MD  Chief Complaint: Shortness of breath  HPI: Mark [name] is a 68 y.o. male with Past medical history of diastolic dysfunction, COPD, hypertension, dyslipidemia, GERD, CKD, atrial fibrillation. The patient is coming from home. The patient presents with complaints of shortness of breath and orthopnea as well as PND. He mentions that he was discharged of Lasix recently. And last Thursday he went to see his doctor who placed him back on Lasix for 2 days and then ask him to stop taking. Since Saturday he has noted 3 pound weight gain along with worsening leg swelling and shortness of breath. He denies any other change in his medication in mentions he is compliant with his medication. He mentions he checks his weight and a daily basis. Pt denies any fever, chills, headache, cough, chest pain, palpitation, nausea, vomiting, abdominal pain, diarrhea, constipation, active bleeding, burning urination, dizziness, pedal edema,  focal neurological deficit.   Review of Systems: as mentioned in the history of present illness.  A Comprehensive review of the other systems is negative.  Past Medical History  Diagnosis Date  . GERD (gastroesophageal reflux disease)   . Hyperlipidemia   . H/O hiatal hernia   . Hypertension     Sees Dr. Lynnea Ferrier  . AAA (abdominal aortic aneurysm)   . COPD (chronic obstructive pulmonary disease)   . Diastolic heart failure     a.  Echo (08/13/2013): EF 55-60%, normal wall motion, mild MR, mild LAE  . Pneumonia   . Arthritis     "little in my hands" (09/08/2013)  . Chronic kidney disease (CKD), stage III (moderate)   . Atrial fibrillation     a. amiodarone Rx started 08/2013;  b. s/p TEE-DCCV 09/2013 => recurrent AFib => repeat DCCV => NSR;   c. Xarelto 15 QD due to CKD  . Hx  of cardiovascular stress test     a. Myoview 09/2012:  no scar or ischemia   Past Surgical History  Procedure Laterality Date  . Knee arthroscopy Right   . Abdominal aortic aneurysm repair  10/15/2012    Procedure: ANEURYSM ABDOMINAL AORTIC REPAIR;  Surgeon: Pryor Ochoa, MD;  Location: [facility] OR;  Service: Vascular;  Laterality: N/A;  Resection and Grafting of Abdominal Aortic Aneurysm - Aortobi-Iliac   . Tee without cardioversion N/A 09/22/2013    Procedure: TRANSESOPHAGEAL ECHOCARDIOGRAM (TEE);  Surgeon: Lars Masson, MD;  Location: [facility] ENDOSCOPY;  Service: Cardiovascular;  Laterality: N/A;  . Cardioversion N/A 09/22/2013    Procedure: CARDIOVERSION;  Surgeon: Lars Masson, MD;  Location: [facility] ENDOSCOPY;  Service: Cardiovascular;  Laterality: N/A;   Social History:  reports that he has quit smoking. His smoking use included Cigarettes. He has a 56 pack-year smoking history. He has quit using smokeless tobacco. His smokeless tobacco use included Chew. He reports that he does not drink alcohol or use illicit drugs. Independent for most of his  ADL.  Allergies  Allergen Reactions  . Niaspan [Niacin Er] Anaphylaxis    Family History  Problem Relation Age of Onset  . Diabetes Mother   . Heart disease Father   . Heart attack Father     Prior to Admission medications   Medication Sig Start Date End Date Taking? Authorizing Provider  albuterol (PROVENTIL HFA;VENTOLIN HFA) 108 (90 BASE) MCG/ACT inhaler Inhale 2 puffs into  the lungs every 6 (six) hours as needed for wheezing or shortness of breath. 08/18/13  Yes Donita Brooks, MD  amiodarone (PACERONE) 200 MG tablet Take 200 mg by mouth daily. 09/29/13  Yes Rhetta Mura, MD  atorvastatin (LIPITOR) 40 MG tablet Take 1 tablet (40 mg total) by mouth daily. 09/29/13  Yes Rhetta Mura, MD  budesonide-formoterol (SYMBICORT) 160-4.5 MCG/ACT inhaler Inhale 2 puffs into the lungs 2 (two) times daily.   Yes Historical Provider, MD   diltiazem (CARDIZEM CD) 300 MG 24 hr capsule Take 1 capsule (300 mg total) by mouth daily. 09/12/13  Yes Elease Etienne, MD  glucose blood test strip Check BS TID Dx - 250.00 09/30/13  Yes Donita Brooks, MD  linagliptin (TRADJENTA) 5 MG TABS tablet Take 1 tablet (5 mg total) by mouth daily. 09/30/13  Yes Donita Brooks, MD  metoprolol succinate (TOPROL-XL) 25 MG 24 hr tablet Take 3 tablets (75 mg total) by mouth daily after breakfast. Take with or immediately following a meal. 09/29/13  Yes Rhetta Mura, MD  omeprazole (PRILOSEC) 40 MG capsule Take 1 capsule (40 mg total) by mouth 2 (two) times daily. 08/17/13  Yes Kathlen Mody, MD  Rivaroxaban (XARELTO) 15 MG TABS tablet Take 1 tablet (15 mg total) by mouth daily with supper. 09/12/13  Yes Elease Etienne, MD  tiotropium (SPIRIVA) 18 MCG inhalation capsule Place 18 mcg into inhaler and inhale daily.   Yes Historical Provider, MD    Physical Exam: Filed Vitals:   10/13/13 2100 10/13/13 2130 10/13/13 2141 10/13/13 2218  BP: 139/70 135/72  140/73  Pulse: 58 59  63  Temp:    98.5 F (36.9 C)  TempSrc:      Resp:  14    Height:   5' 6.5" (1.689 m)   Weight:   66.951 kg (147 lb 9.6 oz)   SpO2: 94% 95%  91%    General: Alert, Awake and Oriented to Time, Place and Person. Appear in mild distress Eyes: PERRL ENT: Oral Mucosa clear moist. Neck: Mild JVD Cardiovascular: S1 and S2 Present, no Murmur, Peripheral Pulses Present Respiratory: Bilateral Air entry equal and Decreased, bilateral basal Crackles, no wheezes Abdomen: Bowel Sound Present, Soft and Non tender Skin: No Rash Extremities: Bilateral Pedal edema, no calf tenderness Neurologic: Grossly Unremarkable. Labs on Admission:  CBC:  Recent Labs Lab 10/13/13 1727  WBC 6.9  HGB 11.3*  HCT 33.9*  MCV 93.9  PLT 261    CMP     Component Value Date/Time   NA 138 10/13/2013 1854   K 5.7* 10/13/2013 1854   CL 99 10/13/2013 1854   CO2 28 10/13/2013 1854   GLUCOSE  116* 10/13/2013 1854   BUN 23 10/13/2013 1854   CREATININE 2.03* 10/13/2013 1854   CREATININE 2.21* 10/08/2013 1450   CALCIUM 8.8 10/13/2013 1854   PROT 5.6* 10/05/2013 1146   ALBUMIN 2.9* 10/05/2013 1146   AST 19 10/05/2013 1146   ALT 27 10/05/2013 1146   ALKPHOS 69 10/05/2013 1146   BILITOT 0.7 10/05/2013 1146   GFRNONAA 32* 10/13/2013 1854   GFRAA 37* 10/13/2013 1854    No results found for this basename: LIPASE, AMYLASE,  in the last 168 hours No results found for this basename: AMMONIA,  in the last 168 hours  No results found for this basename: CKTOTAL, CKMB, CKMBINDEX, TROPONINI,  in the last 168 hours BNP (last 3 results)  Recent Labs  09/08/13 1032 09/19/13 0628 10/13/13 1727  PROBNP 5402.0* 6856.0* 4757.0*    Radiological Exams on Admission: Dg Chest 2 View  10/13/2013   CLINICAL DATA:  Shortness of breath, cough  EXAM: CHEST - 2 VIEW  COMPARISON:  09/25/2013  FINDINGS: Increase in bilateral pleural effusions. Worsening atelectasis/consolidation in the lung bases left greater than right. Patchy airspace opacities in the lingula and superior segment right lower lobe. No pneumothorax. Mild cardiomegaly. Regional bones unremarkable. Marland Kitchen.  IMPRESSION: 1. Interval increase in pleural effusions and asymmetric airspace disease.   Electronically Signed   By: Oley Balmaniel  Hassell M.D.   On: 10/13/2013 18:36    EKG: Independently reviewed. normal sinus rhythm, nonspecific ST and T waves changes.  Assessment/Plan Active Problems:   Atrial fibrillation   Long term (current) use of anticoagulants   Acute on chronic diastolic CHF (congestive heart failure)   COPD (chronic obstructive pulmonary disease)   Chronic kidney disease (CKD), stage III (moderate)   1. Acute on chronic diastolic CHF (congestive heart failure) The patient is presenting with complaints of shortness of breath with weight gain and pedal edema. He has elevated proBNP on weight. He has received IV Lasix in the ED and has felt  improvement in his breathing. I would continue him on IV Lasix for one more dose and continue his rest of the medications He may require at least a when necessary basis does based on his weight gain I would for telemetry and serial troponin  2. Atrial fibrillation At present controlled Continue Xarelto  3. CKD At present stable  Avoid nephrotoxic medications  4. COPD Patient has been placed on by his PCP I would continue him on duo nebs at present I would also provide and incentive spirometry  DVT Prophylaxis: Xarelto Nutrition: Cardiac diet  Code Status: Full  Disposition: Admitted to inpatient in telemetry unit.  Author: Lynden OxfordPranav Kadin Bera, MD [facility] Pager: [phone] 10/13/2013, 11:09 PM    If 7PM-7AM, please contact night-coverage [email or web address] Password [email or web address]

## 2013-10-14 ENCOUNTER — Encounter: Payer: Medicare Other | Admitting: Physician Assistant

## 2013-10-14 DIAGNOSIS — I4891 Unspecified atrial fibrillation: Secondary | ICD-10-CM

## 2013-10-14 DIAGNOSIS — I714 Abdominal aortic aneurysm, without rupture, unspecified: Secondary | ICD-10-CM

## 2013-10-14 DIAGNOSIS — I5033 Acute on chronic diastolic (congestive) heart failure: Principal | ICD-10-CM

## 2013-10-14 LAB — BASIC METABOLIC PANEL
BUN: 28 mg/dL — AB (ref 6–23)
CALCIUM: 8.4 mg/dL (ref 8.4–10.5)
CO2: 31 meq/L (ref 19–32)
CREATININE: 2.06 mg/dL — AB (ref 0.50–1.35)
Chloride: 94 mEq/L — ABNORMAL LOW (ref 96–112)
GFR calc Af Amer: 36 mL/min — ABNORMAL LOW (ref >90)
GFR calc non Af Amer: 31 mL/min — ABNORMAL LOW (ref >90)
GLUCOSE: 106 mg/dL — AB (ref 70–99)
Potassium: 6.1 mEq/L — ABNORMAL HIGH (ref 3.7–5.3)
Sodium: 135 mEq/L — ABNORMAL LOW (ref 137–147)

## 2013-10-14 LAB — COMPREHENSIVE METABOLIC PANEL
ALK PHOS: 75 U/L (ref 39–117)
ALT: 32 U/L (ref 0–53)
AST: 31 U/L (ref 0–37)
Albumin: 2.5 g/dL — ABNORMAL LOW (ref 3.5–5.2)
BUN: 22 mg/dL (ref 6–23)
CO2: 30 mEq/L (ref 19–32)
Calcium: 8.6 mg/dL (ref 8.4–10.5)
Chloride: 101 mEq/L (ref 96–112)
Creatinine, Ser: 2.14 mg/dL — ABNORMAL HIGH (ref 0.50–1.35)
GFR calc Af Amer: 35 mL/min — ABNORMAL LOW (ref >90)
GFR calc non Af Amer: 30 mL/min — ABNORMAL LOW (ref >90)
Glucose, Bld: 103 mg/dL — ABNORMAL HIGH (ref 70–99)
Potassium: 6 mEq/L — ABNORMAL HIGH (ref 3.7–5.3)
Sodium: 141 mEq/L (ref 137–147)
Total Bilirubin: 0.2 mg/dL — ABNORMAL LOW (ref 0.3–1.2)
Total Protein: 5.9 g/dL — ABNORMAL LOW (ref 6.0–8.3)

## 2013-10-14 LAB — CBC
HCT: 30.1 % — ABNORMAL LOW (ref 39.0–52.0)
Hemoglobin: 10.1 g/dL — ABNORMAL LOW (ref 13.0–17.0)
MCH: 31.4 pg (ref 26.0–34.0)
MCHC: 33.6 g/dL (ref 30.0–36.0)
MCV: 93.5 fL (ref 78.0–100.0)
PLATELETS: 219 10*3/uL (ref 150–400)
RBC: 3.22 MIL/uL — ABNORMAL LOW (ref 4.22–5.81)
RDW: 14.4 % (ref 11.5–15.5)
WBC: 5.8 10*3/uL (ref 4.0–10.5)

## 2013-10-14 LAB — GLUCOSE, CAPILLARY
GLUCOSE-CAPILLARY: 123 mg/dL — AB (ref 70–99)
Glucose-Capillary: 99 mg/dL (ref 70–99)
Glucose-Capillary: 94 mg/dL (ref 70–99)
Glucose-Capillary: 121 mg/dL — ABNORMAL HIGH (ref 70–99)
Glucose-Capillary: 98 mg/dL (ref 70–99)

## 2013-10-14 LAB — TROPONIN I
Troponin I: <0.3 ng/mL (ref <0.30)
Troponin I: <0.3 ng/mL (ref <0.30)

## 2013-10-14 NOTE — Care Management Note (Addendum)
    Page 1 of 2   10/17/2013     2:39:25 PM   CARE MANAGEMENT NOTE 10/17/2013  Patient:  Mark [name],Mark [name]   Account Number:  [id number]  Date Initiated:  10/14/2013  Documentation initiated by:  Oletta CohnWOOD,CAMELLIA  Subjective/Objective Assessment:   68 y.o. male with Past medical history of diastolic dysfunction, COPD, hypertension, dyslipidemia, GERD, CKD, atrial fibrillation. Admitted for CHF//Home with spouse     Action/Plan:   IV diureses//Home with HH   Anticipated DC Date:  10/17/2013   Anticipated DC Plan:  HOME W HOME HEALTH SERVICES      DC Planning Services  CM consult      [facility]  [facility]   Choice offered to / List presented to:  C-1 Patient        HH arranged  HH-1 RN  HH-10 DISEASE MANAGEMENT      HH [facility]   Status of service:  Completed, signed off Medicare Important Message given?   (If response is "NO", the following Medicare IM given date fields will be blank) Date Medicare IM given:   Date Additional Medicare IM given:    Discharge Disposition:    Per UR Regulation:    If discussed at Long Length of Stay Meetings, dates discussed:    Comments:  10/17/13 14:35 [facility], CM faxed facesheet, F2F, and orders to [phone].  No other CM needs were communicated.  Freddy Jakschsarah Annalycia Done, BSN< CM [phone].  10/17/13 13:55 CM notified [facility] of pt pending discharge today.  [facility]) to be brought to room by family. No other CM needs were communicated.  Freddy JakschSarah Latashia Koch, BSN, [place] [phone].  10/14/13 1400 Oletta Cohnamellia Wood, RN, BSN, [place] [employer] [phone] Spoke with pt at bedside regarding discharge planning [facility]. Offered pt list of home health agencies to choose from.  [facility]. Ayesha RumpfMary Yonjof, RN [facility] notified.  No DME needs identified at this time.

## 2013-10-14 NOTE — Progress Notes (Addendum)
[facility] PROGRESS NOTE  Filed Weights   10/13/13 2141 10/14/13 0442  Weight: 66.951 kg (147 lb 9.6 oz) 65.9 kg (145 lb 4.5 oz)        Intake/Output Summary (Last 24 hours) at 10/14/13 2243 Last data filed at 10/14/13 2146  Gross per 24 hour  Intake    840 ml  Output   2100 ml  Net  -1260 ml     Assessment/Plan: Acute on chronic diastolic CHF (congestive heart failure) -continue IV lasix, daily weight and close I's and O's -patient on B-blocker and normal EF in Echo done Nov 2014 -neg troponin -will need  Daily low dose of lasix -low sodium diet and fluid restriction  Chronic kidney disease (CKD), stage III (moderate) -Cr stable -will monitor closely due to diuresis  COPD (chronic obstructive pulmonary disease) -continue home meds -stable and currently no wheezing  DM type 2: -continue low carb diet -A1C 7.5 -continue tradjenta  Long term (current) use of anticoagulants -continue xarelto -no overt bleeding  Atrial fibrillation -status post ablation by EP -on amiodarone tapering dose, diltiazem, b-blocker and xarelto  AAA -will touch bases with vascular surgery for close follow up and decision on needed intervention.   Hyperkalemia -hemolysis seen on repeat labs -no abnormalities on telemetry or EKG -will continue IV lasix and repeat BMET in am  Code Status: full  Family Communication: no family at bedside Disposition Plan:    Consultants:  None   Procedures: ECHO: 08/13/13, no wall motion abnormalities, normal EF.  Antibiotics:  None   HPI/Subjective: Breathing better, denies nausea or vomiting. No fever. Mild left rib discomfort, no truly pain according to patient.  Objective: Filed Vitals:   10/14/13 1410 10/14/13 1500 10/14/13 1700 10/14/13 2038  BP:  123/67 127/68 134/73  Pulse: 65 88 62 64  Temp:  98 F (36.7 C) 97.9 F (36.6 C) 98.7 F (37.1 C)  TempSrc:  Oral Oral Oral  Resp:  18 18 18   Height:      Weight:       SpO2: 94% 98% 98% 98%     Exam:  General: Alert, awake, oriented x3, in no acute distress.  HEENT: No bruits, no goiter. No JVD Heart: Regular rate and rhythm, without rubs or gallops. Positive SEM (soft)  Lungs: improved movement bilateral, no wheezing, slight crackles at bases Abdomen: Soft, nontender, nondistended, positive bowel sounds. No pulsatile masses on exam Neuro: Grossly intact, nonfocal.   Data Reviewed: Basic Metabolic Panel:  Recent Labs Lab 10/08/13 1450 10/13/13 1727 10/13/13 1854 10/14/13 0540 10/14/13 1645  NA 136 140 138 141 135*  K 4.9 6.0* 5.7* 6.0* 6.1*  CL 100 99 99 101 94*  CO2 27 29 28 30 31   GLUCOSE 83 108* 116* 103* 106*  BUN 32* 23 23 22  28*  CREATININE 2.21* 2.09* 2.03* 2.14* 2.06*  CALCIUM 8.4 9.2 8.8 8.6 8.4   Liver Function Tests:  Recent Labs Lab 10/14/13 0540  AST 31  ALT 32  ALKPHOS 75  BILITOT 0.2*  PROT 5.9*  ALBUMIN 2.5*   CBC:  Recent Labs Lab 10/13/13 1727 10/14/13 0540  WBC 6.9 5.8  HGB 11.3* 10.1*  HCT 33.9* 30.1*  MCV 93.9 93.5  PLT 261 219   Cardiac Enzymes:  Recent Labs Lab 10/14/13 0007 10/14/13 0540 10/14/13 1100  TROPONINI <0.30 <0.30 <0.30   BNP (last 3 results)  Recent Labs  09/08/13 1032 09/19/13 0628 10/13/13 1727  PROBNP 5402.0* 6856.0* 4757.0*   CBG:  Recent Labs Lab 10/14/13 0202 10/14/13 0706 10/14/13 1137 10/14/13 1615 10/14/13 2102  GLUCAP 99 94 121* 98 123*    Studies: Dg Chest 2 View  10/13/2013   CLINICAL DATA:  Shortness of breath, cough  EXAM: CHEST - 2 VIEW  COMPARISON:  09/25/2013  FINDINGS: Increase in bilateral pleural effusions. Worsening atelectasis/consolidation in the lung bases left greater than right. Patchy airspace opacities in the lingula and superior segment right lower lobe. No pneumothorax. Mild cardiomegaly. Regional bones unremarkable. Marland Kitchen.  IMPRESSION: 1. Interval increase in pleural effusions and asymmetric airspace disease.   Electronically  Signed   By: Oley Balmaniel  Hassell M.D.   On: 10/13/2013 18:36    Scheduled Meds: . amiodarone  200 mg Oral Daily  . atorvastatin  40 mg Oral Daily  . budesonide-formoterol  2 puff Inhalation BID  . diltiazem  300 mg Oral Daily  . furosemide  40 mg Intravenous Once  . insulin aspart  0-5 Units Subcutaneous QHS  . insulin aspart  0-9 Units Subcutaneous TID WC  . linagliptin  5 mg Oral Daily  . metoprolol succinate  75 mg Oral QPC breakfast  . pantoprazole  40 mg Oral Daily  . Rivaroxaban  15 mg Oral Q supper  . sodium chloride  3 mL Intravenous Q12H  . tiotropium  18 mcg Inhalation Daily   Continuous Infusions:   Time > 30 minutes  Cherell Colvin  [facility] Pager [phone]. If 8PM-8AM, please contact night-coverage at [email or web address], password [email or web address] 10/14/2013, 10:43 PM  LOS: 1 day

## 2013-10-14 NOTE — Progress Notes (Signed)
Pt. Alert and oriented. No s/s of distress or discomfort noted. Pt. Arrived to the floor via stretcher from ED in stable condition. Wife and granddaughter at the bedside. Pt. Placed on telemetry. CMT notified. VSS. Pt. Oriented to room. Call light placed within reach. RN will continue to monitor pt. For changes in condition. Gabrianna Fassnacht, Cheryll DessertKaren Cherrell

## 2013-10-14 NOTE — ED Provider Notes (Signed)
Medical screening examination/treatment/procedure(s) were conducted as a shared visit with non-physician practitioner(s) or resident  and myself.  I personally evaluated the patient during the encounter and agree with the findings and plan unless otherwise indicated.    I have personally reviewed any xrays and/ or EKG's with the provider and I agree with interpretation.   Gradual worsening leg swelling/ weight gain and dyspnea since stopping lasix.  Clinically acute CHF exacerbation. Pt requiring 5-6 L [place].  Few crackles bilateral bases, neck supple, mild JVD, no distress, abd soft/ NT, mild leg swelling bilateral LE.  Lasix and nitro in ED. Admitted to tele.    Acute CHF exacerbation, CRF  ED EKG  Date: 10/14/2013  Rate: 63  Rhythm: normal sinus rhythm  QRS Axis: left  Intervals: normal  ST/T Wave abnormalities: nonspecific ST changes  Conduction Disutrbances:none   EKG Interpretation    Date/Time:  Tuesday October 13 2013 19:05:30 EST Ventricular Rate:  63 PR Interval:  161 QRS Duration: 91 QT Interval:  422 QTC Calculation: 432 R Axis:   -28 Text Interpretation:  Sinus rhythm Nonspecific ST changes. Borderline left axis deviation Low voltage, extremity leads Confirmed by Jodi MourningZAVITZ  MD, Lashawnda Hancox ([id number]) on 10/14/2013 2:28:30 AM                Enid SkeensJoshua M Wadie Mattie, MD 10/14/13 [id number]

## 2013-10-15 LAB — GLUCOSE, CAPILLARY
GLUCOSE-CAPILLARY: 100 mg/dL — AB (ref 70–99)
GLUCOSE-CAPILLARY: 122 mg/dL — AB (ref 70–99)
Glucose-Capillary: 108 mg/dL — ABNORMAL HIGH (ref 70–99)
Glucose-Capillary: 142 mg/dL — ABNORMAL HIGH (ref 70–99)

## 2013-10-15 LAB — BASIC METABOLIC PANEL
BUN: 30 mg/dL — ABNORMAL HIGH (ref 6–23)
CO2: 33 mEq/L — ABNORMAL HIGH (ref 19–32)
Calcium: 8.7 mg/dL (ref 8.4–10.5)
Chloride: 96 mEq/L (ref 96–112)
Creatinine, Ser: 2.25 mg/dL — ABNORMAL HIGH (ref 0.50–1.35)
GFR, EST AFRICAN AMERICAN: 32 mL/min — AB (ref >90)
GFR, EST NON AFRICAN AMERICAN: 28 mL/min — AB (ref >90)
Glucose, Bld: 116 mg/dL — ABNORMAL HIGH (ref 70–99)
POTASSIUM: 5.3 meq/L (ref 3.7–5.3)
Sodium: 139 mEq/L (ref 137–147)

## 2013-10-15 MED ORDER — MENTHOL 3 MG MT LOZG
1.0000 | LOZENGE | OROMUCOSAL | Status: DC | PRN
  Filled 2013-10-15 (×2): qty 9

## 2013-10-15 MED ORDER — PHENOL 1.4 % MT LIQD
1.0000 | OROMUCOSAL | Status: DC | PRN
  Administered 2013-10-16: 1 via OROMUCOSAL
  Filled 2013-10-15 (×2): qty 177

## 2013-10-15 NOTE — Progress Notes (Signed)
[facility] PROGRESS NOTE  Filed Weights   10/13/13 2141 10/14/13 0442 10/15/13 0657  Weight: 66.951 kg (147 lb 9.6 oz) 65.9 kg (145 lb 4.5 oz) 65.681 kg (144 lb 12.8 oz)        Intake/Output Summary (Last 24 hours) at 10/15/13 1657 Last data filed at 10/15/13 1500  Gross per 24 hour  Intake   1230 ml  Output   2500 ml  Net  -1270 ml     Assessment/Plan: Acute on chronic diastolic CHF (congestive heart failure) -continue IV lasix, daily weight and close I's and O's -patient on B-blocker and normal EF in Echo done Nov 2014 -neg troponin -will continue Daily low dose of lasix -low sodium diet and fluid restriction -3 pounds lighter since admission.  Chronic kidney disease (CKD), stage III (moderate) -Cr continue to be stable -will monitor closely due to diuresis  COPD (chronic obstructive pulmonary disease) -continue home meds -stable and currently no wheezing  DM type 2: -continue low carb diet -A1C 7.5 -continue tradjenta  Long term (current) use of anticoagulants -continue xarelto -no overt bleeding  Atrial fibrillation -status post ablation by EP -on amiodarone, diltiazem, b-blocker and xarelto -continue follow up with cardiology as an outpatient -patient continue   AAA -after discussing with vascular surgery they will continue schedule outpatient follow up.   Hyperkalemia -resolved  -no abnormalities on telemetry or EKG -will continue to monitor  Code Status: full  Family Communication: no family at bedside Disposition Plan:    Consultants:  None   Procedures: ECHO: 08/13/13, no wall motion abnormalities, normal EF.  Antibiotics:  None   HPI/Subjective: Breathing continue to improve, denies nausea or vomiting. No fever. No CP  Objective: Filed Vitals:   10/15/13 0856 10/15/13 1155 10/15/13 1425 10/15/13 1427  BP:  137/61  129/73  Pulse: 65 67  68  Temp:    98.1 F (36.7 C)  TempSrc:    Oral  Resp: 18 18  18   Height:       Weight:      SpO2: 96% 98% 96% 97%     Exam:  General: Alert, awake, oriented x3, in no acute distress. Breathing a lot better.  HEENT: No bruits, no goiter. No JVD Heart: Regular rate and rhythm, without rubs or gallops. Positive SEM (soft)  Lungs: improved movement bilateral, no wheezing, slight crackles at bases still present Abdomen: Soft, nontender, nondistended, positive bowel sounds. No pulsatile masses on exam Neuro: Grossly intact, nonfocal.   Data Reviewed: Basic Metabolic Panel:  Recent Labs Lab 10/13/13 1727 10/13/13 1854 10/14/13 0540 10/14/13 1645 10/15/13 0519  NA 140 138 141 135* 139  K 6.0* 5.7* 6.0* 6.1* 5.3  CL 99 99 101 94* 96  CO2 29 28 30 31  33*  GLUCOSE 108* 116* 103* 106* 116*  BUN 23 23 22  28* 30*  CREATININE 2.09* 2.03* 2.14* 2.06* 2.25*  CALCIUM 9.2 8.8 8.6 8.4 8.7   Liver Function Tests:  Recent Labs Lab 10/14/13 0540  AST 31  ALT 32  ALKPHOS 75  BILITOT 0.2*  PROT 5.9*  ALBUMIN 2.5*   CBC:  Recent Labs Lab 10/13/13 1727 10/14/13 0540  WBC 6.9 5.8  HGB 11.3* 10.1*  HCT 33.9* 30.1*  MCV 93.9 93.5  PLT 261 219   Cardiac Enzymes:  Recent Labs Lab 10/14/13 0007 10/14/13 0540 10/14/13 1100  TROPONINI <0.30 <0.30 <0.30   BNP (last 3 results)  Recent Labs  09/08/13 1032 09/19/13 0628 10/13/13 1727  PROBNP  5402.0* 6856.0* 4757.0*   CBG:  Recent Labs Lab 10/14/13 1615 10/14/13 2102 10/15/13 0602 10/15/13 1121 10/15/13 1619  GLUCAP 98 123* 100* 108* 142*    Studies: Dg Chest 2 View  10/13/2013   CLINICAL DATA:  Shortness of breath, cough  EXAM: CHEST - 2 VIEW  COMPARISON:  09/25/2013  FINDINGS: Increase in bilateral pleural effusions. Worsening atelectasis/consolidation in the lung bases left greater than right. Patchy airspace opacities in the lingula and superior segment right lower lobe. No pneumothorax. Mild cardiomegaly. Regional bones unremarkable. Marland Kitchen  IMPRESSION: 1. Interval increase in pleural  effusions and asymmetric airspace disease.   Electronically Signed   By: Oley Balm M.D.   On: 10/13/2013 18:36    Scheduled Meds: . amiodarone  200 mg Oral Daily  . atorvastatin  40 mg Oral Daily  . budesonide-formoterol  2 puff Inhalation BID  . diltiazem  300 mg Oral Daily  . furosemide  40 mg Intravenous Once  . insulin aspart  0-5 Units Subcutaneous QHS  . insulin aspart  0-9 Units Subcutaneous TID WC  . linagliptin  5 mg Oral Daily  . metoprolol succinate  75 mg Oral QPC breakfast  . pantoprazole  40 mg Oral Daily  . Rivaroxaban  15 mg Oral Q supper  . sodium chloride  3 mL Intravenous Q12H  . tiotropium  18 mcg Inhalation Daily   Continuous Infusions:   Time > 30 minutes  Gunda Maqueda  [facility] Pager [phone]. If 8PM-8AM, please contact night-coverage at [email or web address], password [facility] 10/15/2013, 4:57 PM  LOS: 2 days

## 2013-10-15 NOTE — Clinical Documentation Improvement (Signed)
  Per ED Note "on 2 L of oxygen at home was hypoxic at home and increased to 3 L". Would Chronic respiratory failure be an appropriate secondary diagnosis to reflect this patient's severity of illness and risk of mortality? If you agree please add to documentation. Thank you.  Thank You, Beverley FiedlerLaurie E Ivon Oelkers ,RN Clinical Documentation Specialist:  [phone]  [facility] Information Management

## 2013-10-15 NOTE — Progress Notes (Signed)
Pt. Alert and oriented this am. No s/s of distress or discomfort noted. No c/o pain. Pt. Resting in bed quietly during the night. VSS. Call light within reach. RN will continue to monitor pt. For changes in condition.  Eunique Balik, Cheryll DessertKaren Cherrell

## 2013-10-16 ENCOUNTER — Ambulatory Visit: Payer: Medicare Other | Admitting: Family Medicine

## 2013-10-16 DIAGNOSIS — J9620 Acute and chronic respiratory failure, unspecified whether with hypoxia or hypercapnia: Secondary | ICD-10-CM

## 2013-10-16 DIAGNOSIS — E875 Hyperkalemia: Secondary | ICD-10-CM

## 2013-10-16 LAB — BASIC METABOLIC PANEL
BUN: 30 mg/dL — AB (ref 6–23)
CHLORIDE: 96 meq/L (ref 96–112)
CO2: 32 meq/L (ref 19–32)
Calcium: 8.5 mg/dL (ref 8.4–10.5)
Creatinine, Ser: 2.25 mg/dL — ABNORMAL HIGH (ref 0.50–1.35)
GFR calc Af Amer: 32 mL/min — ABNORMAL LOW (ref >90)
GFR calc non Af Amer: 28 mL/min — ABNORMAL LOW (ref >90)
Glucose, Bld: 149 mg/dL — ABNORMAL HIGH (ref 70–99)
Potassium: 5.4 mEq/L — ABNORMAL HIGH (ref 3.7–5.3)
Sodium: 139 mEq/L (ref 137–147)

## 2013-10-16 LAB — GLUCOSE, CAPILLARY
GLUCOSE-CAPILLARY: 119 mg/dL — AB (ref 70–99)
Glucose-Capillary: 102 mg/dL — ABNORMAL HIGH (ref 70–99)
Glucose-Capillary: 114 mg/dL — ABNORMAL HIGH (ref 70–99)

## 2013-10-16 MED ORDER — FUROSEMIDE 10 MG/ML IJ SOLN
40.0000 mg | Freq: Once | INTRAMUSCULAR | Status: AC
  Administered 2013-10-16: 40 mg via INTRAVENOUS

## 2013-10-16 NOTE — Evaluation (Signed)
Physical Therapy Evaluation Patient Details Name: Mark [name] MRN: [id number] DOB: 09-12-1946 Today's Date: 10/16/2013 Time: [id number] PT Time Calculation (min): 23 min  PT Assessment / Plan / Recommendation History of Present Illness  Mark [name] is a 68 y.o. male with Past medical history of diastolic dysfunction, COPD, hypertension, dyslipidemia, GERD, CKD, atrial fibrillation. Pt presents with SOB and CHF.  Clinical Impression  Pt presents with dependencies in mobility secondary to O2 desaturation with activity. Pt does no appear to accept the need for O2 with gait. Pt asked me multiple times if he needs the oxygen which I replied every time yes. O2 sats 96% on 2 liters at rest, 89% on RA at rest, 80% on 2 liters after gait. Educated pt on pursed lip breathing and continued to reinforce the need for oxygen. Recommend continued PT to focus on ambulation while monitoring O2 sats and education to improve overall safety.    PT Assessment  Patient needs continued PT services    Follow Up Recommendations  No PT follow up    Does the patient have the potential to tolerate intense rehabilitation      Barriers to Discharge        Equipment Recommendations  None recommended by PT    Recommendations for Other Services     Frequency Min 3X/week    Precautions / Restrictions Precautions Precautions: Other (comment) (desaturation with activity)   Pertinent Vitals/Pain       Mobility  Bed Mobility Overal bed mobility: Independent Transfers Overall transfer level: Independent Ambulation/Gait Ambulation/Gait assistance: Supervision Ambulation Distance (Feet): 300 Feet Assistive device: None Gait Pattern/deviations: WFL(Within Functional Limits) Gait velocity: good Gait velocity interpretation: >2.62 ft/sec, indicative of independent community ambulator General Gait Details: pt desat to 80% on 2 liters on 02 during gait.    Exercises     PT Diagnosis:  Difficulty walking  PT Problem List: Decreased activity tolerance;Decreased mobility;Decreased knowledge of precautions PT Treatment Interventions: Gait training;Functional mobility training;Patient/family education     PT Goals(Current goals can be found in the care plan section) Acute Rehab PT Goals Patient Stated Goal: to go home and to not need o2 PT Goal Formulation: With patient Time For Goal Achievement: 10/30/13 Potential to Achieve Goals: Good  Visit Information  Last PT Received On: 10/16/13 Assistance Needed: +1 History of Present Illness: Mark [name] is a 68 y.o. male with Past medical history of diastolic dysfunction, COPD, hypertension, dyslipidemia, GERD, CKD, atrial fibrillation. Pt presents with SOB and CHF.       Prior Functioning       Cognition  Cognition Arousal/Alertness: Awake/alert Behavior During Therapy: WFL for tasks assessed/performed Overall Cognitive Status: Within Functional Limits for tasks assessed    Extremity/Trunk Assessment Lower Extremity Assessment Lower Extremity Assessment: Overall WFL for tasks assessed Cervical / Trunk Assessment Cervical / Trunk Assessment: Normal   Balance Balance Overall balance assessment: No apparent balance deficits (not formally assessed)  End of Session PT - End of Session Equipment Utilized During Treatment: Oxygen Activity Tolerance: Patient tolerated treatment well Patient left: in bed;with family/visitor present;with call bell/phone within reach Nurse Communication: Mobility status  GP     Greggory StallionWrisley, Nikolas Casher Kerstine 10/16/2013, 1:44 PM

## 2013-10-16 NOTE — Discharge Instructions (Signed)
Information on my medicine - XARELTO® (Rivaroxaban) ° °This medication education was reviewed with me or my healthcare representative as part of my discharge preparation.  The pharmacist that spoke with me during my hospital stay was:  Madeliene Tejera P, RPH ° °Why was Xarelto® prescribed for you? °Xarelto® was prescribed for you to reduce the risk of a blood clot forming that can cause a stroke if you have a medical condition called atrial fibrillation (a type of irregular heartbeat). ° °What do you need to know about xarelto® ? °Take your Xarelto® ONCE DAILY at the same time every day with your evening meal. °If you have difficulty swallowing the tablet whole, you may crush it and mix in applesauce just prior to taking your dose. ° °Take Xarelto® exactly as prescribed by your doctor and DO NOT stop taking Xarelto® without talking to the doctor who prescribed the medication.  Stopping without other stroke prevention medication to take the place of Xarelto® may increase your risk of developing a clot that causes a stroke.  Refill your prescription before you run out. ° °After discharge, you should have regular check-up appointments with your healthcare provider that is prescribing your Xarelto®.  In the future your dose may need to be changed if your kidney function or weight changes by a significant amount. ° °What do you do if you miss a dose? °If you are taking Xarelto® ONCE DAILY and you miss a dose, take it as soon as you remember on the same day then continue your regularly scheduled once daily regimen the next day. Do not take two doses of Xarelto® at the same time or on the same day.  ° °Important Safety Information °A possible side effect of Xarelto® is bleeding. You should call your healthcare provider right away if you experience any of the following: °  Bleeding from an injury or your nose that does not stop. °  Unusual colored urine (red or dark brown) or unusual colored stools (red or black). °  Unusual  bruising for unknown reasons. °  A serious fall or if you hit your head (even if there is no bleeding). ° °Some medicines may interact with Xarelto® and might increase your risk of bleeding while on Xarelto®. To help avoid this, consult your healthcare provider or pharmacist prior to using any new prescription or non-prescription medications, including herbals, vitamins, non-steroidal anti-inflammatory drugs (NSAIDs) and supplements. ° °This website has more information on Xarelto®: www.xarelto.com. ° °

## 2013-10-16 NOTE — Progress Notes (Signed)
Pt given Lasix 40 mg IV per MD Order. 02 at 2 l n/c Sat now 93 %

## 2013-10-16 NOTE — Progress Notes (Signed)
Nutrition Brief Note  Patient identified on the Malnutrition Screening Tool (MST) Report  Wt Readings from Last 15 Encounters:  10/16/13 144 lb 4.8 oz (65.454 kg)  10/08/13 152 lb (68.947 kg)  10/06/13 154 lb (69.854 kg)  10/05/13 156 lb (70.761 kg)  09/29/13 151 lb (68.493 kg)  09/29/13 151 lb (68.493 kg)  09/29/13 151 lb (68.493 kg)  09/18/13 164 lb (74.39 kg)  09/12/13 154 lb 12.8 oz (70.217 kg)  09/08/13 159 lb (72.122 kg)  09/04/13 169 lb (76.658 kg)  08/21/13 159 lb (72.122 kg)  08/15/13 151 lb 9.6 oz (68.765 kg)  08/11/13 146 lb (66.225 kg)  04/13/13 146 lb (66.225 kg)    Body mass index is 22.94 kg/(m^2). Patient meets criteria for Normal Weight based on current BMI. Per weight history, pt's weight has been stable since 04/13/13.   Current diet order is Carb Modified, patient is consuming approximately 100% of meals at this time. Pt states his appetite is good and he is eating well. He relates weight loss to fluid losses. Pt has received nutrition education during previous admission. Pt has no questions or concerns at this time.  Labs and medications reviewed.   No nutrition interventions warranted at this time. If nutrition issues arise, please consult RD.   Ian Malkineanne Barnett RD, LDN Inpatient Clinical Dietitian Pager: [phone] After Hours Pager: [phone]

## 2013-10-16 NOTE — Progress Notes (Signed)
I spoke briefly with Mr. Mark [name] concerning heart failure.  He says he knows about it as he had it for a while.  I reinforced heart failure education with him including signs and symptoms of heart failure, when to call the physician, taking medications as prescribed, low sodium diet and fluid restriction.  He likes to drink coffee, he says "its either that or smoke".  I reinforced fluid restriction with him and he verbalizes understanding.  He plans to be discharge to home with his wife and granddaughter. [facility] as well.  Crissy Mccreadie RN,BSN, PCCN--Heart Failure Navigator

## 2013-10-16 NOTE — Progress Notes (Signed)
[facility] PROGRESS NOTE  Filed Weights   10/14/13 0442 10/15/13 0657 10/16/13 0555  Weight: 65.9 kg (145 lb 4.5 oz) 65.681 kg (144 lb 12.8 oz) 65.454 kg (144 lb 4.8 oz)        Intake/Output Summary (Last 24 hours) at 10/16/13 1839 Last data filed at 10/16/13 1400  Gross per 24 hour  Intake    920 ml  Output   2180 ml  Net  -1260 ml     Assessment/Plan: Acute on chronic resp failure due to diastolic CHF exacerbation (congestive heart failure) -continue IV lasix, daily weight and close I's and O's -patient on B-blocker and normal EF in Echo done Nov 2014 -neg troponin -will continue IV lasix today and at discharge daily low dose lasix will be required to control volume -low sodium diet and fluid restriction -weight is not reliable but patient neg 4-5L approx since admission and breathing better -repeat CXR in am and also BNP  Chronic kidney disease (CKD), stage III (moderate) -renal function continue to be stable, especially by GFR -will monitor closely due to diuresis -BMET in am  COPD (chronic obstructive pulmonary disease) -continue home meds -stable and currently w/o significant wheezing  DM type 2: -continue low carb diet -A1C 7.5 -continue tradjenta  Long term (current) use of anticoagulants -continue xarelto -no overt bleeding  Atrial fibrillation -status post ablation by EP (patient back on a. Fib) -on amiodarone, diltiazem, b-blocker and xarelto -continue follow up with cardiology as an outpatient -patient continue   AAA -after discussing with vascular surgery they will continue schedule outpatient follow up.   Hyperkalemia -resolved  -no abnormalities on telemetry or EKG -will continue to monitor  Code Status: full  Family Communication: no family at bedside Disposition Plan:    Consultants:  None   Procedures: ECHO: 08/13/13, no wall motion abnormalities, normal EF.  Antibiotics:  None   HPI/Subjective: Breathing slightly  better according to patient; denies CP, nausea or vomiting. No fever.   Objective: Filed Vitals:   10/16/13 0947 10/16/13 1336 10/16/13 1337 10/16/13 1419  BP:    132/67  Pulse:    61  Temp:    97.5 F (36.4 C)  TempSrc:    Oral  Resp: 20   20  Height:      Weight:      SpO2: 93% 96% 80% 98%     Exam:  General: Alert, awake, oriented x3, in no acute distress. Breathing a lot better.  HEENT: No bruits, no goiter. No JVD Heart: Regular rate and rhythm, without rubs or gallops. Positive SEM (soft). 2 ++ edema Lungs: no changes from yesterday on air movement bilateral, no wheezing, continue to have positive crackles at bases; scattered rhonchi Abdomen: Soft, nontender, nondistended, positive bowel sounds. No pulsatile masses on exam Neuro: Grossly intact, nonfocal.   Data Reviewed: Basic Metabolic Panel:  Recent Labs Lab 10/13/13 1854 10/14/13 0540 10/14/13 1645 10/15/13 0519 10/16/13 0340  NA 138 141 135* 139 139  K 5.7* 6.0* 6.1* 5.3 5.4*  CL 99 101 94* 96 96  CO2 28 30 31  33* 32  GLUCOSE 116* 103* 106* 116* 149*  BUN 23 22 28* 30* 30*  CREATININE 2.03* 2.14* 2.06* 2.25* 2.25*  CALCIUM 8.8 8.6 8.4 8.7 8.5   Liver Function Tests:  Recent Labs Lab 10/14/13 0540  AST 31  ALT 32  ALKPHOS 75  BILITOT 0.2*  PROT 5.9*  ALBUMIN 2.5*   CBC:  Recent Labs Lab 10/13/13 1727 10/14/13  0540  WBC 6.9 5.8  HGB 11.3* 10.1*  HCT 33.9* 30.1*  MCV 93.9 93.5  PLT 261 219   Cardiac Enzymes:  Recent Labs Lab 10/14/13 0007 10/14/13 0540 10/14/13 1100  TROPONINI <0.30 <0.30 <0.30   BNP (last 3 results)  Recent Labs  09/08/13 1032 09/19/13 0628 10/13/13 1727  PROBNP 5402.0* 6856.0* 4757.0*   CBG:  Recent Labs Lab 10/15/13 1619 10/15/13 2134 10/16/13 0601 10/16/13 1146 10/16/13 1622  GLUCAP 142* 122* 102* 114* 119*    Studies: No results found.  Scheduled Meds: . amiodarone  200 mg Oral Daily  . atorvastatin  40 mg Oral Daily  .  budesonide-formoterol  2 puff Inhalation BID  . diltiazem  300 mg Oral Daily  . insulin aspart  0-5 Units Subcutaneous QHS  . insulin aspart  0-9 Units Subcutaneous TID WC  . linagliptin  5 mg Oral Daily  . metoprolol succinate  75 mg Oral QPC breakfast  . pantoprazole  40 mg Oral Daily  . Rivaroxaban  15 mg Oral Q supper  . sodium chloride  3 mL Intravenous Q12H  . tiotropium  18 mcg Inhalation Daily   Continuous Infusions:   Time > 30 minutes  Mark [name]  [facility] Pager [phone]. If 8PM-8AM, please contact night-coverage at [email or web address], password [facility] 10/16/2013, 6:39 PM  LOS: 3 days

## 2013-10-16 NOTE — Plan of Care (Signed)
Problem: Consults Goal: Tobacco Cessation referral if indicated Outcome: Completed/Met Date Met:  10/16/13 Pt says he does not smoke but granddaughter and son do outside- talked about second hand smoke

## 2013-10-16 NOTE — Progress Notes (Signed)
Medicare Important Message given. Ettamae Barkett J. Hillery Zachman, RN, BSN, NCM [phone].   

## 2013-10-16 NOTE — Progress Notes (Signed)
Went over heart failure booklet with emphasis on diet and daily weights. 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 Using teachbook method and pt has some understanding. Made a joke about smoking - says if I have to stay on this oxygen at home all the time I might as well start smoking again.. Redirected pt back to hazards of second hand smoke and eating a low NA diet including sodium hidden in certain foods. Trying to wean pt's 02 from 3 to 2. Sats reamain low 90's. And  he desaturates  when up in room amb to BR

## 2013-10-17 ENCOUNTER — Inpatient Hospital Stay (HOSPITAL_COMMUNITY): Payer: Medicare Other

## 2013-10-17 DIAGNOSIS — E785 Hyperlipidemia, unspecified: Secondary | ICD-10-CM

## 2013-10-17 LAB — BASIC METABOLIC PANEL
BUN: 30 mg/dL — ABNORMAL HIGH (ref 6–23)
CALCIUM: 8.7 mg/dL (ref 8.4–10.5)
CHLORIDE: 94 meq/L — AB (ref 96–112)
CO2: 30 meq/L (ref 19–32)
Creatinine, Ser: 2.38 mg/dL — ABNORMAL HIGH (ref 0.50–1.35)
GFR calc non Af Amer: 26 mL/min — ABNORMAL LOW (ref >90)
GFR, EST AFRICAN AMERICAN: 30 mL/min — AB (ref >90)
Glucose, Bld: 118 mg/dL — ABNORMAL HIGH (ref 70–99)
Potassium: 5.4 mEq/L — ABNORMAL HIGH (ref 3.7–5.3)
SODIUM: 137 meq/L (ref 137–147)

## 2013-10-17 LAB — GLUCOSE, CAPILLARY
Glucose-Capillary: 121 mg/dL — ABNORMAL HIGH (ref 70–99)
Glucose-Capillary: 102 mg/dL — ABNORMAL HIGH (ref 70–99)
Glucose-Capillary: 149 mg/dL — ABNORMAL HIGH (ref 70–99)

## 2013-10-17 LAB — PRO B NATRIURETIC PEPTIDE: Pro B Natriuretic peptide (BNP): 2580 pg/mL — ABNORMAL HIGH (ref 0–125)

## 2013-10-17 MED ORDER — FUROSEMIDE 40 MG PO TABS: 40.0000 mg | ORAL_TABLET | Freq: Every day | ORAL | Status: DC

## 2013-10-17 NOTE — Progress Notes (Signed)
   CARE MANAGEMENT NOTE 10/17/2013  Patient:  Mark [name],Mark [name]   Account Number:  [id number]  Date Initiated:  10/14/2013  Documentation initiated by:  [facility],CAMELLIA  Subjective/Objective Assessment:   68 y.o. male with Past medical history of diastolic dysfunction, COPD, hypertension, dyslipidemia, GERD, CKD, atrial fibrillation. Admitted for CHF//Home with spouse     Action/Plan:   IV diureses//Home with HH   Anticipated DC Date:  10/17/2013   Anticipated DC Plan:  HOME W HOME HEALTH SERVICES      DC Planning Services  CM consult      [facility]   Choice offered to / List presented to:  C-1 Patient        HH arranged  HH-1 RN  HH-10 DISEASE MANAGEMENT      [facility]   Status of service:  Completed, signed off Medicare Important Message given?   (If response is "NO", the following Medicare IM given date fields will be blank) Date Medicare IM given:   Date Additional Medicare IM given:    Discharge Disposition:    Per UR Regulation:    If discussed at Long Length of Stay Meetings, dates discussed:    Comments:  10/17/13 13:55 [facility].  [facility] to be brought to room by family. No other CM needs were communicated.  Freddy JakschSarah Jeriah Corkum, BSN, [place] [phone].  10/14/13 1400 Oletta Cohnamellia Wood, RN, BSN, [place] [employer] [phone] Spoke with pt at bedside regarding [facility]. Offered pt list of home health agencies to choose from.  [facility]. Ayesha RumpfMary Yonjof, RN [facility] notified.  No DME needs identified at this time.

## 2013-10-17 NOTE — Discharge Summary (Signed)
Physician Discharge Summary  Mark [name] ZOX:[id number] DOB: [birth year] DOA: 10/13/2013  PCP: Leo Grosser, MD  Admit date: 10/13/2013 Discharge date: 10/17/2013  Time spent: >30 minutes  Recommendations for Outpatient Follow-up:  1. BMET to follow renal function and electrolytes 2. Close follow up to CBG's and diabetes with adjustments in his meds as needed 3. Continue slowly weaning oxygen down as tolerated   BNP    Component Value Date/Time   PROBNP 2580.0* 10/17/2013 0250   Filed Weights   10/15/13 0657 10/16/13 0555 10/17/13 0422  Weight: 65.681 kg (144 lb 12.8 oz) 65.454 kg (144 lb 4.8 oz) 64.864 kg (143 lb)     Discharge Diagnoses:  Acute on chronic resp failure due diastolic heart failure exacerbation Acute on chronic diastolic CHF (congestive heart failure) Atrial fibrillation Long term (current) use of anticoagulants COPD (chronic obstructive pulmonary disease) Chronic kidney disease (CKD), stage III (moderate) DM type 2 (A1C 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 Patient required to be discharge on oxygen especially for exertion due to O2 sat dropping below 88% on RA.  Diet recommendation: low sodium diet and low carb diet   History of present illness:  68 y.o. male with Past medical history of diastolic dysfunction, COPD, hypertension, dyslipidemia, GERD, CKD, atrial fibrillation.  The patient is coming from home.  The patient presents with complaints of shortness of breath and orthopnea as well as PND. He mentions that he was discharged of Lasix recently. And last Thursday he went to see his doctor who placed him back on Lasix for 2 days and then ask him to stop taking. Since Saturday he has noted 3 pound weight gain along with worsening leg swelling and shortness of breath.  He denies any other change in his medication in mentions he is compliant with his medication. He mentions he checks his weight and a daily basis.  Pt denies any  fever, chills, headache, cough, chest pain, palpitation, nausea, vomiting, abdominal pain, diarrhea, constipation, active bleeding, burning urination, dizziness, pedal edema, focal neurological deficit.    Hospital Course:  Acute on chronic resp failure due to diastolic CHF exacerbation (congestive heart failure)  -will discharge on daily lasix 40mg  -low sodium diet -encourage to stick to 1-1.2 L fluids intake -we might need to get use of higher than usual Cr while helping controlling his volume and breathing -patient on B-blocker and normal EF in Echo done Nov 2014  -neg troponin X 3 -weight is not reliable but patient neg 7L approx since admission and breathing a whole better and w/o crackles or JVD -BNP lower as well at discharge -stoking socks recommended to help with LE swelling  Chronic kidney disease (CKD), stage III (moderate)  -renal function continue to be stable, especially compare by  GFR  -will monitor closely after discharge -he might be now in 2.2 - 2.3 range at baseline -at discharge 2.3  COPD (chronic obstructive pulmonary disease)  -continue home meds  -stable and currently w/o significant wheezing  -continue home oxygen   DM type 2:  -continue low carb diet  -A1C 7.5  -continue tradjenta   Long term (current) use of anticoagulants  -continue xarelto  -no overt bleeding   Atrial fibrillation  -status post ablation by EP (patient back on a. Fib)  -on amiodarone, diltiazem, b-blocker and xarelto  -continue follow up with cardiology as an outpatient  -patient continue   AAA  -after discussing with vascular surgery they will continue schedule outpatient follow up.  Hyperkalemia  -resolved  -no abnormalities on telemetry or EKG  -will continue to monitor   Procedures:  See below for x-ray reports  Consultations:  None   Discharge Exam: Filed Vitals:   10/17/13 0422  BP: 135/69  Pulse: 66  Temp: 98.4 F (36.9 C)  Resp: 18   General:  Alert, awake, oriented x3, in no acute distress. Breathing a lot better.  HEENT: No bruits, no goiter. No JVD  Heart: Regular rate and rhythm, without rubs or gallops. Positive SEM (soft). 2 ++ edema bilaterally Lungs: improved air movement bilateral, no wheezing, no crackles; scattered rhonchi  Abdomen: Soft, nontender, nondistended, positive bowel sounds. No pulsatile masses on exam  Neuro: Grossly intact, nonfocal.    Discharge Instructions  Discharge Orders   Future Appointments Provider Department Dept Phone   11/04/2013 11:50 AM Kennon RoundsScott T Weaver, PA-C [facility] [address] [facility] [phone]   11/26/2013 3:15 PM Jerene BearsKevan M Mellendick, RD [facility] [phone]   12/29/2013 9:00 AM [facility] [address] [phone]   12/29/2013 9:30 AM Pryor OchoaJames D Lawson, MD [phone]   Future Orders Complete By Expires   Diet - low sodium heart healthy  As directed    Discharge instructions  As directed    Comments:     Wear oxygen 24/7 especially on activity Arrange follow up with PCP in 10 days Follow a low sodium diet (< 2 grams daily) Maintain good hydration (approx 1-1.2 L daily, no more than that)       Medication List         albuterol 108 (90 BASE) MCG/ACT inhaler  Commonly known as:  PROVENTIL HFA;VENTOLIN HFA  Inhale 2 puffs into the lungs every 6 (six) hours as needed for wheezing or shortness of breath.     amiodarone 200 MG tablet  Commonly known as:  PACERONE  Take 200 mg by mouth daily.     atorvastatin 40 MG tablet  Commonly known as:  LIPITOR  Take 1 tablet (40 mg total) by mouth daily.     budesonide-formoterol 160-4.5 MCG/ACT inhaler  Commonly known as:  SYMBICORT  Inhale 2 puffs into the lungs 2 (two) times daily.     diltiazem 300 MG 24 hr capsule  Commonly known as:  CARDIZEM CD  Take 1 capsule (300 mg total) by mouth daily.     furosemide 40 MG  tablet  Commonly known as:  LASIX  Take 1 tablet (40 mg total) by mouth daily.     glucose blood test strip  Check BS TID Dx - 250.00     linagliptin 5 MG Tabs tablet  Commonly known as:  TRADJENTA  Take 1 tablet (5 mg total) by mouth daily.     metoprolol succinate 25 MG 24 hr tablet  Commonly known as:  TOPROL-XL  Take 3 tablets (75 mg total) by mouth daily after breakfast. Take with or immediately following a meal.     omeprazole 40 MG capsule  Commonly known as:  PRILOSEC  Take 1 capsule (40 mg total) by mouth 2 (two) times daily.     Rivaroxaban 15 MG Tabs tablet  Commonly known as:  XARELTO  Take 1 tablet (15 mg total) by mouth daily with supper.     tiotropium 18 MCG inhalation capsule  Commonly known as:  SPIRIVA  Place 18 mcg into inhaler and inhale daily.       Allergies  Allergen Reactions  . Niaspan [Niacin Er] Anaphylaxis       Follow-up Information   Follow up with [facility]. Banker)    Contact information:   [address] [place] [address] [phone]       Follow up with [facility] TOM, MD. Schedule an appointment as soon as possible for a visit in 10 days.   Specialty:  Family Medicine   Contact information:   [address] [place] [address] [phone]        The results of significant diagnostics from this hospitalization (including imaging, microbiology, ancillary and laboratory) are listed below for reference.    Significant Diagnostic Studies: Dg Chest 2 View  10/17/2013   CLINICAL DATA:  Shortness of breath, follow up  EXAM: CHEST  2 VIEW  COMPARISON:  10/13/2013  FINDINGS: Minimal enlargement of cardiac silhouette with pulmonary vascular congestion.  Atherosclerotic calcification of a mildly tortuous thoracic aorta.  Bibasilar effusions and atelectasis greater on left.  Underlying COPD changes.  Patchy perihilar infiltrates improved.  No pneumothorax or acute osseous findings.  IMPRESSION:  Slightly improved perihilar infiltrates.  Persistent bibasilar effusions and atelectasis greater on left.   Electronically Signed   By: Ulyses Southward M.D.   On: 10/17/2013 12:17   Dg Chest 2 View  10/13/2013   CLINICAL DATA:  Shortness of breath, cough  EXAM: CHEST - 2 VIEW  COMPARISON:  09/25/2013  FINDINGS: Increase in bilateral pleural effusions. Worsening atelectasis/consolidation in the lung bases left greater than right. Patchy airspace opacities in the lingula and superior segment right lower lobe. No pneumothorax. Mild cardiomegaly. Regional bones unremarkable. Marland Kitchen  IMPRESSION: 1. Interval increase in pleural effusions and asymmetric airspace disease.   Electronically Signed   By: Oley Balm M.D.   On: 10/13/2013 18:36   Dg Chest 2 View  09/25/2013   CLINICAL DATA:  Pneumonia, shortness of breath  EXAM: CHEST  2 VIEW  COMPARISON:  09/19/2013  FINDINGS: Stable small pleural effusions, slightly larger on the left. Left lower lobe retrocardiac streaky opacity persist without significant change compatible with pneumonia. Minor right base atelectasis. Upper lungs clear. Normal heart size and vascularity. No pneumothorax. Trachea is midline.  IMPRESSION: Small pleural effusions with persistent left base streaky airspace disease concerning for pneumonia   Electronically Signed   By: Ruel Favors M.D.   On: 09/25/2013 08:06   Dg Chest Portable 1 View  09/19/2013   CLINICAL DATA:  Shortness of breath  EXAM: PORTABLE CHEST - 1 VIEW  COMPARISON:  09/10/2013  FINDINGS: Small bilateral pleural effusions, left more than right. The volume may have mildly increased from before. There is also increased lung density at the left base. Reticular opacities at the right base appears similar to prior. Normal heart size.  IMPRESSION: 1. Increased lung opacification at the left base, which could represent pneumonia or pneumonitis. 2. Small bilateral pleural effusions.   Electronically Signed   By: Tiburcio Pea M.D.   On:  09/19/2013 06:53   Labs: Basic Metabolic Panel:  Recent Labs Lab 10/14/13 0540 10/14/13 1645 10/15/13 0519 10/16/13 0340 10/17/13 0250  NA 141 135* 139 139 137  K 6.0* 6.1* 5.3 5.4* 5.4*  CL 101 94* 96 96 94*  CO2 30 31 33* 32 30  GLUCOSE 103* 106* 116* 149* 118*  BUN 22 28* 30* 30* 30*  CREATININE 2.14* 2.06* 2.25* 2.25* 2.38*  CALCIUM 8.6 8.4 8.7 8.5 8.7   Liver Function  Tests:  Recent Labs Lab 10/14/13 0540  AST 31  ALT 32  ALKPHOS 75  BILITOT 0.2*  PROT 5.9*  ALBUMIN 2.5*   CBC:  Recent Labs Lab 10/13/13 1727 10/14/13 0540  WBC 6.9 5.8  HGB 11.3* 10.1*  HCT 33.9* 30.1*  MCV 93.9 93.5  PLT 261 219   Cardiac Enzymes:  Recent Labs Lab 10/14/13 0007 10/14/13 0540 10/14/13 1100  TROPONINI <0.30 <0.30 <0.30   BNP: BNP (last 3 results)  Recent Labs  09/19/13 0628 10/13/13 1727 10/17/13 0250  PROBNP 6856.0* 4757.0* 2580.0*   CBG:  Recent Labs Lab 10/16/13 1146 10/16/13 1622 10/16/13 2021 10/17/13 0429 10/17/13 1122  GLUCAP 114* 119* 121* 102* 149*    Signed:  Bhavika Schnider  [facility] 10/17/2013, 1:10 PM

## 2013-10-23 ENCOUNTER — Ambulatory Visit (INDEPENDENT_AMBULATORY_CARE_PROVIDER_SITE_OTHER): Payer: Medicare Other | Admitting: Family Medicine

## 2013-10-23 ENCOUNTER — Encounter: Payer: Self-pay | Admitting: Family Medicine

## 2013-10-23 ENCOUNTER — Other Ambulatory Visit (HOSPITAL_COMMUNITY): Payer: Self-pay | Admitting: Family Medicine

## 2013-10-23 ENCOUNTER — Other Ambulatory Visit (HOSPITAL_COMMUNITY): Payer: Self-pay | Admitting: Cardiology

## 2013-10-23 VITALS — BP 152/70 | HR 70 | Temp 97.1°F | Resp 22 | Ht 66.5 in | Wt 152.0 lb

## 2013-10-23 DIAGNOSIS — Z09 Encounter for follow-up examination after completed treatment for conditions other than malignant neoplasm: Secondary | ICD-10-CM

## 2013-10-23 DIAGNOSIS — I509 Heart failure, unspecified: Secondary | ICD-10-CM

## 2013-10-23 DIAGNOSIS — R609 Edema, unspecified: Secondary | ICD-10-CM

## 2013-10-23 MED ORDER — POTASSIUM CHLORIDE CRYS ER 20 MEQ PO TBCR: 20.0000 meq | EXTENDED_RELEASE_TABLET | Freq: Every day | ORAL | Status: DC

## 2013-10-23 NOTE — Progress Notes (Signed)
Subjective:    Patient ID: Mark [name], male    DOB: [birth year], 68 y.o.   MRN: [id number]  HPI  Patient was recently readmitted to the hospital with diastolic CHF exacerbation.  I have copied relevant portions of the discharge summary and have included them below: Admit date: 10/13/2013  Discharge date: 10/17/2013  Time spent: >30 minutes  Recommendations for Outpatient Follow-up:  1. BMET to follow renal function and electrolytes 2. Close follow up to CBG's and diabetes with adjustments in his meds as needed 3. Continue slowly weaning oxygen down as tolerated BNP    Component  Value  Date/Time    PROBNP  2580.0*  10/17/2013 0250    Filed Weights    10/15/13 0657  10/16/13 0555  10/17/13 0422   Weight:  65.681 kg (144 lb 12.8 oz)  65.454 kg (144 lb 4.8 oz)  64.864 kg (143 lb)   Discharge Diagnoses:  Acute on chronic resp failure due diastolic heart failure exacerbation  Acute on chronic diastolic CHF (congestive heart failure)  Atrial fibrillation  Long term (current) use of anticoagulants  COPD (chronic obstructive pulmonary disease)  Chronic kidney disease (CKD), stage III (moderate)  DM type 2 (A1C 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 Patient required to be discharge on oxygen especially for exertion due to O2 sat dropping below 88% on RA.   History of present illness:  68 y.o. male with Past medical history of diastolic dysfunction, COPD, hypertension, dyslipidemia, GERD, CKD, atrial fibrillation.  The patient is coming from home.  The patient presents with complaints of shortness of breath and orthopnea as well as PND. He mentions that he was discharged of Lasix recently. And last Thursday he went to see his doctor who placed him back on Lasix for 2 days and then ask him to stop taking. Since Saturday he has noted 3 pound weight gain along with worsening leg swelling and shortness of breath.  He denies any other change in his medication in mentions he is  compliant with his medication. He mentions he checks his weight and a daily basis.  Pt denies any fever, chills, headache, cough, chest pain, palpitation, nausea, vomiting, abdominal pain, diarrhea, constipation, active bleeding, burning urination, dizziness, pedal edema, focal neurological deficit.  Hospital Course:  Acute on chronic resp failure due to diastolic CHF exacerbation (congestive heart failure)  -will discharge on daily lasix 40mg   -low sodium diet  -encourage to stick to 1-1.2 L fluids intake  -we might need to get use of higher than usual Cr while helping controlling his volume and breathing  -patient on B-blocker and normal EF in Echo done Nov 2014  -neg troponin X 3  -weight is not reliable but patient neg 7L approx since admission and breathing a whole better and w/o crackles or JVD  -BNP lower as well at discharge  -stoking socks recommended to help with LE swelling  Chronic kidney disease (CKD), stage III (moderate)  -renal function continue to be stable, especially compare by GFR  -will monitor closely after discharge  -he might be now in 2.2 - 2.3 range at baseline  -at discharge 2.3  COPD (chronic obstructive pulmonary disease)  -continue home meds  -stable and currently w/o significant wheezing  -continue home oxygen  DM type 2:  -continue low carb diet  -A1C 7.5  -continue tradjenta  Long term (current) use of anticoagulants  -continue xarelto  -no overt bleeding  Atrial fibrillation  -status post ablation by EP (  patient back on a. Fib)  -on amiodarone, diltiazem, b-blocker and xarelto  -continue follow up with cardiology as an outpatient  -patient continue  AAA  -after discussing with vascular surgery they will continue schedule outpatient follow up.  Hyperkalemia  -resolved  -no abnormalities on telemetry or EKG  -will continue to monitor  He is here today for follow up.   By his scales at home the patient weighed 139 pounds when he left the  hospital. He was satting 98% on room air his breathing was much better. Since being home on 40 mg a day of Lasix he has gained 5 pounds and is 144 pounds by his home scales. Furthermore his breathing is worse and he is now 84% on room air. Here for shortness of breath and cough and orthopnea. He has +3 edema to his knees bilaterally. He has not been able to get the compression stockings. Furthermore he has run out of spiriva and symbicort.   Past Medical History  Diagnosis Date  . GERD (gastroesophageal reflux disease)   . Hyperlipidemia   . H/O hiatal hernia   . Hypertension     Sees Dr. Lynnea Ferrier  . AAA (abdominal aortic aneurysm)   . COPD (chronic obstructive pulmonary disease)   . Diastolic heart failure     a.  Echo (08/13/2013): EF 55-60%, normal wall motion, mild MR, mild LAE  . Pneumonia   . Arthritis     "little in my hands" (09/08/2013)  . Chronic kidney disease (CKD), stage III (moderate)   . Atrial fibrillation     a. amiodarone Rx started 08/2013;  b. s/p TEE-DCCV 09/2013 => recurrent AFib => repeat DCCV => NSR;   c. Xarelto 15 QD due to CKD  . Hx of cardiovascular stress test     a. Myoview 09/2012:  no scar or ischemia   Current Outpatient Prescriptions on File Prior to Visit  Medication Sig Dispense Refill  . albuterol (PROVENTIL HFA;VENTOLIN HFA) 108 (90 BASE) MCG/ACT inhaler Inhale 2 puffs into the lungs every 6 (six) hours as needed for wheezing or shortness of breath.  1 Inhaler  2  . amiodarone (PACERONE) 200 MG tablet Take 200 mg by mouth daily.      Marland Kitchen atorvastatin (LIPITOR) 40 MG tablet Take 1 tablet (40 mg total) by mouth daily.  30 tablet  5  . budesonide-formoterol (SYMBICORT) 160-4.5 MCG/ACT inhaler Inhale 2 puffs into the lungs 2 (two) times daily.      Marland Kitchen diltiazem (CARDIZEM CD) 300 MG 24 hr capsule Take 1 capsule (300 mg total) by mouth daily.  30 capsule  0  . furosemide (LASIX) 40 MG tablet Take 1 tablet (40 mg total) by mouth daily.  30 tablet  1  .  glucose blood test strip Check BS TID Dx - 250.00  100 each  12  . linagliptin (TRADJENTA) 5 MG TABS tablet Take 1 tablet (5 mg total) by mouth daily.  30 tablet  3  . metoprolol succinate (TOPROL-XL) 25 MG 24 hr tablet Take 3 tablets (75 mg total) by mouth daily after breakfast. Take with or immediately following a meal.  90 tablet  0  . omeprazole (PRILOSEC) 40 MG capsule Take 1 capsule (40 mg total) by mouth 2 (two) times daily.  60 capsule  1  . Rivaroxaban (XARELTO) 15 MG TABS tablet Take 1 tablet (15 mg total) by mouth daily with supper.  30 tablet  0  . tiotropium (SPIRIVA) 18 MCG inhalation capsule  Place 18 mcg into inhaler and inhale daily.       No current facility-administered medications on file prior to visit.   Allergies  Allergen Reactions  . Niaspan [Niacin Er] Anaphylaxis   History   Social History  . Marital Status: Married    Spouse Name: Nicole Cella    Number of Children: 4  . Years of Education: N/A   Occupational History  . Works PT as a Education administrator    Social History Main Topics  . Smoking status: Former Smoker -- 1.00 packs/day for 56 years    Types: Cigarettes  . Smokeless tobacco: Former Neurosurgeon    Types: Chew     Comment: 09/08/2013 "stopped smoking cigarettes ~ 2 months ago; aien't chew in probably 10 yrs"  . Alcohol Use: No  . Drug Use: No  . Sexual Activity: Not Currently   Other Topics Concern  . Not on file   Social History Narrative   Married.  Lives with wife.  Ambulates without assistance.     Review of Systems  All other systems reviewed and are negative.       Objective:   Physical Exam  Vitals reviewed. Constitutional: He appears well-developed and well-nourished. No distress.  HENT:  Mouth/Throat: Oropharynx is clear and moist.  Eyes: Conjunctivae are normal. No scleral icterus.  Neck: Neck supple. No JVD present.  Cardiovascular: Normal rate, regular rhythm and normal heart sounds.   No murmur heard. Pulmonary/Chest: Effort normal.  He has no wheezes. He has rales.  Abdominal: Soft. Bowel sounds are normal. He exhibits no distension. There is no tenderness. There is no rebound and no guarding.  Musculoskeletal: He exhibits edema.  Lymphadenopathy:    He has no cervical adenopathy.  Skin: He is not diaphoretic.   +3 edema in both legs to the level of the knee        Assessment & Plan:  CHF (congestive heart failure) - Plan: COMPLETE METABOLIC PANEL WITH GFR, Brain natriuretic peptide  Edema - Plan: COMPLETE METABOLIC PANEL WITH GFR, Brain natriuretic peptide  Hospital discharge follow-up  Increase Lasix to 80 mg by mouth daily. The patient is to take an extra 80 mg of Lasix today. Also recommended he start K-Dur 20 milliequivalents by mouth daily. I will check a CMP and a BNP today prior to beginning diuresis and on recheck the patient the first of next week. Also place the patient in unna boots on both legs to assist in diuresis. Hopefully after diuresis, when I remove the unna boots we can then place the patient in a medium-sized compression stocking which he is supposed to bring with him to his next appointment.  At the present time he needs diuresis and even if this creates worsening renal insufficiency he is having a difficult time breathing.

## 2013-10-24 LAB — BRAIN NATRIURETIC PEPTIDE: Brain Natriuretic Peptide: 399.9 pg/mL — ABNORMAL HIGH (ref 0.0–100.0)

## 2013-10-24 LAB — COMPLETE METABOLIC PANEL WITH GFR
ALT: 25 U/L (ref 0–53)
AST: 20 U/L (ref 0–37)
Albumin: 3.4 g/dL — ABNORMAL LOW (ref 3.5–5.2)
Alkaline Phosphatase: 86 U/L (ref 39–117)
BILIRUBIN TOTAL: 0.3 mg/dL (ref 0.2–1.2)
BUN: 27 mg/dL — ABNORMAL HIGH (ref 6–23)
CO2: 25 mEq/L (ref 19–32)
Calcium: 9 mg/dL (ref 8.4–10.5)
Chloride: 100 mEq/L (ref 96–112)
Creat: 2.02 mg/dL — ABNORMAL HIGH (ref 0.50–1.35)
GFR, EST AFRICAN AMERICAN: 38 mL/min — AB
GFR, EST NON AFRICAN AMERICAN: 33 mL/min — AB
Glucose, Bld: 141 mg/dL — ABNORMAL HIGH (ref 70–99)
Potassium: 4.8 mEq/L (ref 3.5–5.3)
Sodium: 137 mEq/L (ref 135–145)
Total Protein: 6.3 g/dL (ref 6.0–8.3)

## 2013-10-26 ENCOUNTER — Other Ambulatory Visit: Payer: Self-pay | Admitting: Family Medicine

## 2013-10-26 MED ORDER — GLUCOSE BLOOD VI STRP: ORAL_STRIP | Status: DC

## 2013-10-27 ENCOUNTER — Encounter: Payer: Self-pay | Admitting: Family Medicine

## 2013-10-27 ENCOUNTER — Ambulatory Visit (INDEPENDENT_AMBULATORY_CARE_PROVIDER_SITE_OTHER): Payer: Medicare Other | Admitting: Family Medicine

## 2013-10-27 VITALS — BP 130/64 | HR 68 | Temp 97.1°F | Resp 20 | Ht 66.5 in | Wt 146.0 lb

## 2013-10-27 DIAGNOSIS — N184 Chronic kidney disease, stage 4 (severe): Secondary | ICD-10-CM

## 2013-10-27 DIAGNOSIS — J441 Chronic obstructive pulmonary disease with (acute) exacerbation: Secondary | ICD-10-CM

## 2013-10-27 DIAGNOSIS — I509 Heart failure, unspecified: Secondary | ICD-10-CM

## 2013-10-27 NOTE — Progress Notes (Signed)
Subjective:    Patient ID: Mark [name], male    DOB: 31-Jan-1946, 68 y.o.   MRN: [id number]  HPI  10/23/13 Patient was recently readmitted to the hospital with diastolic CHF exacerbation.  I have copied relevant portions of the discharge summary and have included them below: Admit date: 10/13/2013  Discharge date: 10/17/2013  Time spent: >30 minutes  Recommendations for Outpatient Follow-up:  1. BMET to follow renal function and electrolytes 2. Close follow up to CBG's and diabetes with adjustments in his meds as needed 3. Continue slowly weaning oxygen down as tolerated BNP    Component  Value  Date/Time    PROBNP  2580.0*  10/17/2013 0250    Filed Weights    10/15/13 0657  10/16/13 0555  10/17/13 0422   Weight:  65.681 kg (144 lb 12.8 oz)  65.454 kg (144 lb 4.8 oz)  64.864 kg (143 lb)   Discharge Diagnoses:  Acute on chronic resp failure due diastolic heart failure exacerbation  Acute on chronic diastolic CHF (congestive heart failure)  Atrial fibrillation  Long term (current) use of anticoagulants  COPD (chronic obstructive pulmonary disease)  Chronic kidney disease (CKD), stage III (moderate)  DM type 2 (A1C 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 Patient required to be discharge on oxygen especially for exertion due to O2 sat dropping below 88% on RA.   History of present illness:  68 y.o. male with Past medical history of diastolic dysfunction, COPD, hypertension, dyslipidemia, GERD, CKD, atrial fibrillation.  The patient is coming from home.  The patient presents with complaints of shortness of breath and orthopnea as well as PND. He mentions that he was discharged of Lasix recently. And last Thursday he went to see his doctor who placed him back on Lasix for 2 days and then ask him to stop taking. Since Saturday he has noted 3 pound weight gain along with worsening leg swelling and shortness of breath.  He denies any other change in his medication in  mentions he is compliant with his medication. He mentions he checks his weight and a daily basis.  Pt denies any fever, chills, headache, cough, chest pain, palpitation, nausea, vomiting, abdominal pain, diarrhea, constipation, active bleeding, burning urination, dizziness, pedal edema, focal neurological deficit.  Hospital Course:  Acute on chronic resp failure due to diastolic CHF exacerbation (congestive heart failure)  -will discharge on daily lasix 40mg   -low sodium diet  -encourage to stick to 1-1.2 L fluids intake  -we might need to get use of higher than usual Cr while helping controlling his volume and breathing  -patient on B-blocker and normal EF in Echo done Nov 2014  -neg troponin X 3  -weight is not reliable but patient neg 7L approx since admission and breathing a whole better and w/o crackles or JVD  -BNP lower as well at discharge  -stoking socks recommended to help with LE swelling  Chronic kidney disease (CKD), stage III (moderate)  -renal function continue to be stable, especially compare by GFR  -will monitor closely after discharge  -he might be now in 2.2 - 2.3 range at baseline  -at discharge 2.3  COPD (chronic obstructive pulmonary disease)  -continue home meds  -stable and currently w/o significant wheezing  -continue home oxygen  DM type 2:  -continue low carb diet  -A1C 7.5  -continue tradjenta  Long term (current) use of anticoagulants  -continue xarelto  -no overt bleeding  Atrial fibrillation  -status post ablation by  EP (patient back on a. Fib)  -on amiodarone, diltiazem, b-blocker and xarelto  -continue follow up with cardiology as an outpatient  -patient continue  AAA  -after discussing with vascular surgery they will continue schedule outpatient follow up.  Hyperkalemia  -resolved  -no abnormalities on telemetry or EKG  -will continue to monitor  He is here today for follow up.   By his scales at home the patient weighed 139 pounds when he  left the hospital. He was satting 98% on room air his breathing was much better. Since being home on 40 mg a day of Lasix he has gained 5 pounds and is 144 pounds by his home scales. Furthermore his breathing is worse and he is now 84% on room air. Here for shortness of breath and cough and orthopnea. He has +3 edema to his knees bilaterally. He has not been able to get the compression stockings. Furthermore he has run out of spiriva and symbicort.  At that time, my plan was: Chronic kidney disease (CKD), stage IV (severe) - Plan: BASIC METABOLIC PANEL WITH GFR  CHF (congestive heart failure)  COPD exacerbation  Increase Lasix to 80 mg by mouth daily. The patient is to take an extra 80 mg of Lasix today. Also recommended he start K-Dur 20 milliequivalents by mouth daily. I will check a CMP and a BNP today prior to beginning diuresis and on recheck the patient the first of next week. Also place the patient in unna boots on both legs to assist in diuresis. Hopefully after diuresis, when I remove the unna boots we can then place the patient in a medium-sized compression stocking which he is supposed to bring with him to his next appointment.  At the present time he needs diuresis and even if this creates worsening renal insufficiency he is having a difficult time breathing.  10/27/13 Over the weekend, according to the patient's home scales, he has lost from 144 pounds to 139 pounds. The swelling in his legs has improved dramatically. He has no further edema distal to his knee. His breathing is much better. He is satting 98% on 3 L via nasal cannula. He is 90% on room air after talking for approximately 10 minutes. He is also using Symbicort and spiriva.  My only concern is that his kidneys cannot tolerate the higher dose of Lasix. His been taking 80 mg of Lasix now every day for the last 4 days. Past Medical History  Diagnosis Date  . GERD (gastroesophageal reflux disease)   . Hyperlipidemia   . H/O  hiatal hernia   . Hypertension     Sees Dr. Lynnea Ferrier  . AAA (abdominal aortic aneurysm)   . COPD (chronic obstructive pulmonary disease)   . Diastolic heart failure     a.  Echo (08/13/2013): EF 55-60%, normal wall motion, mild MR, mild LAE  . Pneumonia   . Arthritis     "little in my hands" (09/08/2013)  . Chronic kidney disease (CKD), stage III (moderate)   . Atrial fibrillation     a. amiodarone Rx started 08/2013;  b. s/p TEE-DCCV 09/2013 => recurrent AFib => repeat DCCV => NSR;   c. Xarelto 15 QD due to CKD  . Hx of cardiovascular stress test     a. Myoview 09/2012:  no scar or ischemia   Current Outpatient Prescriptions on File Prior to Visit  Medication Sig Dispense Refill  . albuterol (PROVENTIL HFA;VENTOLIN HFA) 108 (90 BASE) MCG/ACT inhaler Inhale 2 puffs  into the lungs every 6 (six) hours as needed for wheezing or shortness of breath.  1 Inhaler  2  . amiodarone (PACERONE) 200 MG tablet Take 200 mg by mouth daily.      Marland Kitchen atorvastatin (LIPITOR) 40 MG tablet Take 1 tablet (40 mg total) by mouth daily.  30 tablet  5  . budesonide-formoterol (SYMBICORT) 160-4.5 MCG/ACT inhaler Inhale 2 puffs into the lungs 2 (two) times daily.      Marland Kitchen diltiazem (CARDIZEM CD) 300 MG 24 hr capsule Take 1 capsule (300 mg total) by mouth daily.  30 capsule  0  . glucose blood (FREESTYLE INSULINX TEST) test strip Use as instructed  100 each  12  . glucose blood test strip Check BS TID Dx - 250.00  100 each  12  . linagliptin (TRADJENTA) 5 MG TABS tablet Take 1 tablet (5 mg total) by mouth daily.  30 tablet  3  . metoprolol succinate (TOPROL-XL) 25 MG 24 hr tablet TAKE 3 TABLETS BY MOUTH EVERY DAY TAKE WITH OR IMMEDIATELY AFTER BREAKFAST  90 tablet  11  . omeprazole (PRILOSEC) 40 MG capsule Take 1 capsule (40 mg total) by mouth 2 (two) times daily.  60 capsule  1  . potassium chloride SA (K-DUR,KLOR-CON) 20 MEQ tablet Take 1 tablet (20 mEq total) by mouth daily.  30 tablet  3  . tiotropium (SPIRIVA)  18 MCG inhalation capsule Place 18 mcg into inhaler and inhale daily.      Carlena Hurl 15 MG TABS tablet TAKE 1 TABLET BY MOUTH EVERY DAY WITH SUPPER  30 tablet  0   No current facility-administered medications on file prior to visit.   Allergies  Allergen Reactions  . Niaspan [Niacin Er] Anaphylaxis   History   Social History  . Marital Status: Married    Spouse Name: Nicole Cella    Number of Children: 4  . Years of Education: N/A   Occupational History  . Works PT as a Education administrator    Social History Main Topics  . Smoking status: Former Smoker -- 1.00 packs/day for 56 years    Types: Cigarettes  . Smokeless tobacco: Former Neurosurgeon    Types: Chew     Comment: 09/08/2013 "stopped smoking cigarettes ~ 2 months ago; aien't chew in probably 10 yrs"  . Alcohol Use: No  . Drug Use: No  . Sexual Activity: Not Currently   Other Topics Concern  . Not on file   Social History Narrative   Married.  Lives with wife.  Ambulates without assistance.     Review of Systems  All other systems reviewed and are negative.       Objective:   Physical Exam  Vitals reviewed. Constitutional: He appears well-developed and well-nourished. No distress.  HENT:  Mouth/Throat: Oropharynx is clear and moist.  Eyes: Conjunctivae are normal. No scleral icterus.  Neck: Neck supple. No JVD present.  Cardiovascular: Normal rate, regular rhythm and normal heart sounds.   No murmur heard. Pulmonary/Chest: Effort normal. He has no wheezes. He has rales.  Abdominal: Soft. Bowel sounds are normal. He exhibits no distension. There is no tenderness. There is no rebound and no guarding.  Musculoskeletal: He exhibits edema.  Lymphadenopathy:    He has no cervical adenopathy.  Skin: He is not diaphoretic.          Assessment & Plan:  Chronic kidney disease (CKD), stage IV (severe) - Plan: BASIC METABOLIC PANEL WITH GFR  CHF (congestive heart failure)  COPD  exacerbation  Her respiratory status this is the  best the patient has looked me in several months. I would like to continue his current medical regimen if his renal function can tolerate it. I will check a BMP today to evaluate his renal function as well as his potassium. His creatinine is stable between 2.3 and 2.5 I will continue Lasix 80 mg every day and K-Dur 20 milliequivalents every day. I have also instructed the family and the patient monitor his weight. If his weight drops below 139 pounds, we will need to temporarily decrease the Lasix to 40 mg a day. He's going to have to monitor his weight daily and adjust his Lasix dose accordingly. If his renal function is stable I will see the patient back in 2 weeks. If he continues to do well we will then try to bring the patient off oxygen and possibly discontinue spiriva.  We will then begin discussing treatment for diabetes.

## 2013-10-28 LAB — BASIC METABOLIC PANEL WITH GFR
BUN: 31 mg/dL — AB (ref 6–23)
CALCIUM: 9.4 mg/dL (ref 8.4–10.5)
CO2: 26 mEq/L (ref 19–32)
CREATININE: 2.27 mg/dL — AB (ref 0.50–1.35)
Chloride: 95 mEq/L — ABNORMAL LOW (ref 96–112)
GFR, EST AFRICAN AMERICAN: 33 mL/min — AB
GFR, Est Non African American: 28 mL/min — ABNORMAL LOW
GLUCOSE: 88 mg/dL (ref 70–99)
Potassium: 4.9 mEq/L (ref 3.5–5.3)
Sodium: 138 mEq/L (ref 135–145)

## 2013-10-29 ENCOUNTER — Other Ambulatory Visit: Payer: Self-pay | Admitting: Family Medicine

## 2013-10-29 NOTE — Telephone Encounter (Signed)
Medication refilled per protocol. 

## 2013-11-04 ENCOUNTER — Ambulatory Visit: Payer: Medicare Other | Admitting: Physician Assistant

## 2013-11-10 ENCOUNTER — Ambulatory Visit: Payer: Medicare Other | Admitting: Family Medicine

## 2013-11-12 ENCOUNTER — Ambulatory Visit: Payer: Medicare Other | Admitting: Physician Assistant

## 2013-11-13 ENCOUNTER — Ambulatory Visit (INDEPENDENT_AMBULATORY_CARE_PROVIDER_SITE_OTHER): Payer: Medicare Other | Admitting: Family Medicine

## 2013-11-13 ENCOUNTER — Encounter: Payer: Self-pay | Admitting: Family Medicine

## 2013-11-13 VITALS — BP 130/68 | HR 68 | Temp 97.9°F | Resp 20 | Ht 66.5 in | Wt 142.0 lb

## 2013-11-13 DIAGNOSIS — N184 Chronic kidney disease, stage 4 (severe): Secondary | ICD-10-CM

## 2013-11-13 DIAGNOSIS — J449 Chronic obstructive pulmonary disease, unspecified: Secondary | ICD-10-CM

## 2013-11-13 DIAGNOSIS — I509 Heart failure, unspecified: Secondary | ICD-10-CM

## 2013-11-13 MED ORDER — AMIODARONE HCL 200 MG PO TABS: 200.0000 mg | ORAL_TABLET | Freq: Every day | ORAL | Status: DC

## 2013-11-13 NOTE — Progress Notes (Signed)
Subjective:    Patient ID: Mark [name], male    DOB: 31-Jan-1946, 68 y.o.   MRN: [id number]  HPI  10/23/13 Patient was recently readmitted to the hospital with diastolic CHF exacerbation.  I have copied relevant portions of the discharge summary and have included them below: Admit date: 10/13/2013  Discharge date: 10/17/2013  Time spent: >30 minutes  Recommendations for Outpatient Follow-up:  1. BMET to follow renal function and electrolytes 2. Close follow up to CBG's and diabetes with adjustments in his meds as needed 3. Continue slowly weaning oxygen down as tolerated BNP    Component  Value  Date/Time    PROBNP  2580.0*  10/17/2013 0250    Filed Weights    10/15/13 0657  10/16/13 0555  10/17/13 0422   Weight:  65.681 kg (144 lb 12.8 oz)  65.454 kg (144 lb 4.8 oz)  64.864 kg (143 lb)   Discharge Diagnoses:  Acute on chronic resp failure due diastolic heart failure exacerbation  Acute on chronic diastolic CHF (congestive heart failure)  Atrial fibrillation  Long term (current) use of anticoagulants  COPD (chronic obstructive pulmonary disease)  Chronic kidney disease (CKD), stage III (moderate)  DM type 2 (A1C 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 Patient required to be discharge on oxygen especially for exertion due to O2 sat dropping below 88% on RA.   History of present illness:  68 y.o. male with Past medical history of diastolic dysfunction, COPD, hypertension, dyslipidemia, GERD, CKD, atrial fibrillation.  The patient is coming from home.  The patient presents with complaints of shortness of breath and orthopnea as well as PND. He mentions that he was discharged of Lasix recently. And last Thursday he went to see his doctor who placed him back on Lasix for 2 days and then ask him to stop taking. Since Saturday he has noted 3 pound weight gain along with worsening leg swelling and shortness of breath.  He denies any other change in his medication in  mentions he is compliant with his medication. He mentions he checks his weight and a daily basis.  Pt denies any fever, chills, headache, cough, chest pain, palpitation, nausea, vomiting, abdominal pain, diarrhea, constipation, active bleeding, burning urination, dizziness, pedal edema, focal neurological deficit.  Hospital Course:  Acute on chronic resp failure due to diastolic CHF exacerbation (congestive heart failure)  -will discharge on daily lasix 40mg   -low sodium diet  -encourage to stick to 1-1.2 L fluids intake  -we might need to get use of higher than usual Cr while helping controlling his volume and breathing  -patient on B-blocker and normal EF in Echo done Nov 2014  -neg troponin X 3  -weight is not reliable but patient neg 7L approx since admission and breathing a whole better and w/o crackles or JVD  -BNP lower as well at discharge  -stoking socks recommended to help with LE swelling  Chronic kidney disease (CKD), stage III (moderate)  -renal function continue to be stable, especially compare by GFR  -will monitor closely after discharge  -he might be now in 2.2 - 2.3 range at baseline  -at discharge 2.3  COPD (chronic obstructive pulmonary disease)  -continue home meds  -stable and currently w/o significant wheezing  -continue home oxygen  DM type 2:  -continue low carb diet  -A1C 7.5  -continue tradjenta  Long term (current) use of anticoagulants  -continue xarelto  -no overt bleeding  Atrial fibrillation  -status post ablation by  EP (patient back on a. Fib)  -on amiodarone, diltiazem, b-blocker and xarelto  -continue follow up with cardiology as an outpatient  -patient continue  AAA  -after discussing with vascular surgery they will continue schedule outpatient follow up.  Hyperkalemia  -resolved  -no abnormalities on telemetry or EKG  -will continue to monitor  He is here today for follow up.   By his scales at home the patient weighed 139 pounds when he  left the hospital. He was satting 98% on room air his breathing was much better. Since being home on 40 mg a day of Lasix he has gained 5 pounds and is 144 pounds by his home scales. Furthermore his breathing is worse and he is now 84% on room air. Here for shortness of breath and cough and orthopnea. He has +3 edema to his knees bilaterally. He has not been able to get the compression stockings. Furthermore he has run out of spiriva and symbicort.  At that time, my plan was: Chronic kidney disease (CKD), stage IV (severe) - Plan: BASIC METABOLIC PANEL WITH GFR  CHF (congestive heart failure)  COPD exacerbation  Increase Lasix to 80 mg by mouth daily. The patient is to take an extra 80 mg of Lasix today. Also recommended he start K-Dur 20 milliequivalents by mouth daily. I will check a CMP and a BNP today prior to beginning diuresis and on recheck the patient the first of next week. Also place the patient in unna boots on both legs to assist in diuresis. Hopefully after diuresis, when I remove the unna boots we can then place the patient in a medium-sized compression stocking which he is supposed to bring with him to his next appointment.  At the present time he needs diuresis and even if this creates worsening renal insufficiency he is having a difficult time breathing.  10/27/13 Over the weekend, according to the patient's home scales, he has lost from 144 pounds to 139 pounds. The swelling in his legs has improved dramatically. He has no further edema distal to his knee. His breathing is much better. He is satting 98% on 3 L via nasal cannula. He is 90% on room air after talking for approximately 10 minutes. He is also using Symbicort and spiriva.  My only concern is that his kidneys cannot tolerate the higher dose of Lasix. His been taking 80 mg of Lasix now every day for the last 4 days.  At that time, my plan was:  His respiratory status this is the best the patient has looked to me in several  months. I would like to continue his current medical regimen if his renal function can tolerate it. I will check a BMP today to evaluate his renal function as well as his potassium. If his creatinine is stable between 2.3 and 2.5, I will continue Lasix 80 mg every day and K-Dur 20 milliequivalents every day. I have also instructed the family and the patient monitor his weight. If his weight drops below 139 pounds, we will need to temporarily decrease the Lasix to 40 mg a day. He's going to have to monitor his weight daily and adjust his Lasix dose accordingly. If his renal function is stable I will see the patient back in 2 weeks. If he continues to do well, at that time, we will then try to bring the patient off oxygen and possibly discontinue spiriva.  We will then begin discussing treatment for diabetes.  11/13/13 Patient is here today for  follow up.  Today the patient is 142 lbs.   He has lost an additional 4 lbs from his last ov.  The patient had to decrease his Lasix to 40 mg a day due to losing an excessive amount of weight. His weight had dropped to 133 pounds by his home scales. Currently today it is 137 pounds. He has been stable on Lasix 40 mg a day for the last 2 weeks.  His breathing is much better. He is no longer on oxygen and he is satting 97% on room air. He denies any complications.       Wt Readings from Last 3 Encounters:  10/27/13 146 lb (66.225 kg)  10/23/13 152 lb (68.947 kg)  10/17/13 143 lb (64.864 kg)    Past Medical History  Diagnosis Date  . GERD (gastroesophageal reflux disease)   . Hyperlipidemia   . H/O hiatal hernia   . Hypertension     Sees Dr. Lynnea Ferrier  . AAA (abdominal aortic aneurysm)   . COPD (chronic obstructive pulmonary disease)   . Diastolic heart failure     a.  Echo (08/13/2013): EF 55-60%, normal wall motion, mild MR, mild LAE  . Pneumonia   . Arthritis     "little in my hands" (09/08/2013)  . Chronic kidney disease (CKD), stage III (moderate)    . Atrial fibrillation     a. amiodarone Rx started 08/2013;  b. s/p TEE-DCCV 09/2013 => recurrent AFib => repeat DCCV => NSR;   c. Xarelto 15 QD due to CKD  . Hx of cardiovascular stress test     a. Myoview 09/2012:  no scar or ischemia   Current Outpatient Prescriptions on File Prior to Visit  Medication Sig Dispense Refill  . albuterol (PROVENTIL HFA;VENTOLIN HFA) 108 (90 BASE) MCG/ACT inhaler Inhale 2 puffs into the lungs every 6 (six) hours as needed for wheezing or shortness of breath.  1 Inhaler  2  . amiodarone (PACERONE) 200 MG tablet Take 200 mg by mouth daily.      Marland Kitchen atorvastatin (LIPITOR) 40 MG tablet Take 1 tablet (40 mg total) by mouth daily.  30 tablet  5  . budesonide-formoterol (SYMBICORT) 160-4.5 MCG/ACT inhaler Inhale 2 puffs into the lungs 2 (two) times daily.      Marland Kitchen diltiazem (CARDIZEM CD) 300 MG 24 hr capsule Take 1 capsule (300 mg total) by mouth daily.  30 capsule  0  . furosemide (LASIX) 40 MG tablet Take 80 mg by mouth daily.      Marland Kitchen glucose blood (FREESTYLE INSULINX TEST) test strip Use as instructed  100 each  12  . glucose blood test strip Check BS TID Dx - 250.00  100 each  12  . linagliptin (TRADJENTA) 5 MG TABS tablet Take 1 tablet (5 mg total) by mouth daily.  30 tablet  3  . metoprolol succinate (TOPROL-XL) 25 MG 24 hr tablet TAKE 3 TABLETS BY MOUTH EVERY DAY TAKE WITH OR IMMEDIATELY AFTER BREAKFAST  90 tablet  11  . omeprazole (PRILOSEC) 40 MG capsule TAKE ONE CAPSULE TWICE A DAY  60 capsule  6  . potassium chloride SA (K-DUR,KLOR-CON) 20 MEQ tablet Take 1 tablet (20 mEq total) by mouth daily.  30 tablet  3  . tiotropium (SPIRIVA) 18 MCG inhalation capsule Place 18 mcg into inhaler and inhale daily.      Carlena Hurl 15 MG TABS tablet TAKE 1 TABLET BY MOUTH EVERY DAY WITH SUPPER  30 tablet  0  No current facility-administered medications on file prior to visit.   Allergies  Allergen Reactions  . Niaspan [Niacin Er] Anaphylaxis   History   Social History    . Marital Status: Married    Spouse Name: Nicole Cella    Number of Children: 4  . Years of Education: N/A   Occupational History  . Works PT as a Education administrator    Social History Main Topics  . Smoking status: Former Smoker -- 1.00 packs/day for 56 years    Types: Cigarettes  . Smokeless tobacco: Former Neurosurgeon    Types: Chew     Comment: 09/08/2013 "stopped smoking cigarettes ~ 2 months ago; aien't chew in probably 10 yrs"  . Alcohol Use: No  . Drug Use: No  . Sexual Activity: Not Currently   Other Topics Concern  . Not on file   Social History Narrative   Married.  Lives with wife.  Ambulates without assistance.     Review of Systems  All other systems reviewed and are negative.       Objective:   Physical Exam  Vitals reviewed. Constitutional: He appears well-developed and well-nourished. No distress.  HENT:  Mouth/Throat: Oropharynx is clear and moist.  Eyes: Conjunctivae are normal. No scleral icterus.  Neck: Neck supple. No JVD present.  Cardiovascular: Normal rate, regular rhythm and normal heart sounds.   No murmur heard. Pulmonary/Chest: Effort normal. He has no wheezes. He has rales.  Abdominal: Soft. Bowel sounds are normal. He exhibits no distension. There is no tenderness. There is no rebound and no guarding.  Musculoskeletal: He exhibits edema.  Lymphadenopathy:    He has no cervical adenopathy.  Skin: He is not diaphoretic.          Assessment & Plan:  Chronic kidney disease (CKD), stage IV (severe) - Plan: Basic Metabolic Panel  CHF (congestive heart failure)  COPD (chronic obstructive pulmonary disease)  Patient continues to do exceptionally well.  Continue Lasix 40 mg by mouth daily. I asked the patient to discontinue potassium unless he takes 80 mg of Lasix. I instructed the patient to discontinue Spiriva. He is to continue Symbicort 160/4.5 2 puffs inhaled twice a day.  I see the patient back in one month to recheck his hemoglobin A1c. The patient  is now stable and can resume work.

## 2013-11-14 LAB — BASIC METABOLIC PANEL
BUN: 33 mg/dL — AB (ref 6–23)
CALCIUM: 9.2 mg/dL (ref 8.4–10.5)
CO2: 26 mEq/L (ref 19–32)
CREATININE: 2.47 mg/dL — AB (ref 0.50–1.35)
Chloride: 98 mEq/L (ref 96–112)
GLUCOSE: 107 mg/dL — AB (ref 70–99)
POTASSIUM: 4.8 meq/L (ref 3.5–5.3)
Sodium: 134 mEq/L — ABNORMAL LOW (ref 135–145)

## 2013-11-16 ENCOUNTER — Encounter: Payer: Self-pay | Admitting: *Deleted

## 2013-11-20 ENCOUNTER — Other Ambulatory Visit: Payer: Self-pay | Admitting: Family Medicine

## 2013-11-20 NOTE — Telephone Encounter (Signed)
Refill appropriate and filled per protocol. 

## 2013-11-22 ENCOUNTER — Other Ambulatory Visit (HOSPITAL_COMMUNITY): Payer: Self-pay | Admitting: Cardiology

## 2013-11-23 ENCOUNTER — Encounter: Payer: Self-pay | Admitting: Physician Assistant

## 2013-11-23 ENCOUNTER — Other Ambulatory Visit: Payer: Self-pay

## 2013-11-23 ENCOUNTER — Ambulatory Visit (INDEPENDENT_AMBULATORY_CARE_PROVIDER_SITE_OTHER): Payer: Medicare Other | Admitting: Physician Assistant

## 2013-11-23 VITALS — BP 140/70 | HR 63 | Ht 66.5 in | Wt 145.0 lb

## 2013-11-23 DIAGNOSIS — I10 Essential (primary) hypertension: Secondary | ICD-10-CM

## 2013-11-23 DIAGNOSIS — N189 Chronic kidney disease, unspecified: Secondary | ICD-10-CM

## 2013-11-23 DIAGNOSIS — I5032 Chronic diastolic (congestive) heart failure: Secondary | ICD-10-CM

## 2013-11-23 DIAGNOSIS — J449 Chronic obstructive pulmonary disease, unspecified: Secondary | ICD-10-CM

## 2013-11-23 DIAGNOSIS — Z79899 Other long term (current) drug therapy: Secondary | ICD-10-CM

## 2013-11-23 DIAGNOSIS — I4891 Unspecified atrial fibrillation: Secondary | ICD-10-CM

## 2013-11-23 LAB — BASIC METABOLIC PANEL
BUN: 35 mg/dL — AB (ref 6–23)
CALCIUM: 9 mg/dL (ref 8.4–10.5)
CO2: 29 meq/L (ref 19–32)
Chloride: 101 mEq/L (ref 96–112)
Creatinine, Ser: 2.7 mg/dL — ABNORMAL HIGH (ref 0.4–1.5)
GFR: 25.22 mL/min — AB (ref >60.00)
GLUCOSE: 103 mg/dL — AB (ref 70–99)
Potassium: 4.3 mEq/L (ref 3.5–5.1)
Sodium: 138 mEq/L (ref 135–145)

## 2013-11-23 LAB — TSH: TSH: 2.36 u[IU]/mL (ref 0.35–5.50)

## 2013-11-23 NOTE — Patient Instructions (Signed)
Your physician recommends that you continue on your current medications as directed. Please refer to the Current Medication list given to you today.   Your physician [facility] that you [facility]  Your physician recommends that you schedule a follow-up appointment ON 02/25/14 @ 12 PM WITH DR. Patty SermonsBRACKBILL

## 2013-11-23 NOTE — Progress Notes (Signed)
[address], [address] [place], [place]  [address] Phone: [phone] Fax:  [phone]  Date:  11/23/2013   ID:  Mark, [name] April 21, 1946, MRN [id number]  PCP:  Leo Grosser, MD  Cardiologist:  Dr. Cassell Clement     History of Present Illness: Mark [name] is a 68 y.o. male with the history of HTN, HL, CKD, AAA s/p repair in 09/2012, Myoview in 09/2012 normal.   Admitted in 07/2013 for AECOPD c/b AFib with RVR.  CHADS2-VASc=3 (age, HTN, vascular disease).  He was placed on Coumadin and rate control Rx. Plan was to proceed with DCCV should he remain in atrial fibrillation.  Echo demonstrated normal LVF.  Readmitted 08/2013 with a/c diastolic CHF c/b AKI on CKD. Coumadin was switched to Xarelto. He was also placed on amiodarone. He was discharged with plans to proceed with DCCV in approximately 3 weeks. He was discharged off of Lasix due to worsening renal function.    Readmitted 09/2013 with a/c diastolic CHF and persistent atrial fibrillation. He again had worsening renal function with diuresis. Lasix had to be held.  He underwent TEE guided DCCV 09/22/2013. However, he had recurrent atrial fibrillation with RVR. Amiodarone dose was adjusted. He underwent repeat cardioversion 09/28/13.  He remained in NSR at discharge.  He was again not sent home on Lasix due to worsening renal function.   After discharge, his Lasix was resumed due to worsening edema. I saw him in follow up 10/06/13. Volume was improving. He has had close follow up with primary care.  His Lasix had to be adjusted for worsening edema.  He was also started on UNNA boots.  Edema improved and he has been maintained on Lasix 40 QD.  Last creatinine 2/27 was stable.  He returns for f/u.  He is doing well.  Breathing is much better.  He is now off O2.  He denies orthopnea, PND.  LE edema is much improved.  He denies any palpitations or syncope.  He does have an occasional chest ache.  He denies exertional  chest pain or associated symptoms.   Recent Labs: 04/13/2013: HDL Cholesterol 35*; LDL (calc) 61  08/13/2013: TSH 0.389  10/14/2013: Hemoglobin 10.1*  10/17/2013: Pro B Natriuretic peptide (BNP) 2580.0*  10/23/2013: ALT 25  11/13/2013: Creatinine 2.47*; Potassium 4.8  10/23/2013: Albumin 3.4*   Wt Readings from Last 3 Encounters:  11/23/13 145 lb (65.772 kg)  11/13/13 142 lb (64.411 kg)  10/27/13 146 lb (66.225 kg)     Past Medical History  Diagnosis Date  . GERD (gastroesophageal reflux disease)   . Hyperlipidemia   . H/O hiatal hernia   . Hypertension     Sees Dr. Lynnea Ferrier  . AAA (abdominal aortic aneurysm)   . COPD (chronic obstructive pulmonary disease)   . Diastolic heart failure     a.  Echo (08/13/2013): EF 55-60%, normal wall motion, mild MR, mild LAE  . Pneumonia   . Arthritis     "little in my hands" (09/08/2013)  . Chronic kidney disease (CKD), stage III (moderate)   . Atrial fibrillation     a. amiodarone Rx started 08/2013;  b. s/p TEE-DCCV 09/2013 => recurrent AFib => repeat DCCV => NSR;   c. Xarelto 15 QD due to CKD  . Hx of cardiovascular stress test     a. Myoview 09/2012:  no scar or ischemia    Current Outpatient Prescriptions  Medication Sig Dispense Refill  . ADVAIR HFA 115-21 MCG/ACT  inhaler Inhale 2 puffs into the lungs as needed.       Marland Kitchen. albuterol (PROVENTIL HFA;VENTOLIN HFA) 108 (90 BASE) MCG/ACT inhaler Inhale 2 puffs into the lungs every 6 (six) hours as needed for wheezing or shortness of breath.  1 Inhaler  2  . amiodarone (PACERONE) 200 MG tablet Take 1 tablet (200 mg total) by mouth daily.  30 tablet  11  . atorvastatin (LIPITOR) 40 MG tablet Take 1 tablet (40 mg total) by mouth daily.  30 tablet  5  . budesonide-formoterol (SYMBICORT) 160-4.5 MCG/ACT inhaler Inhale 2 puffs into the lungs 2 (two) times daily.      Marland Kitchen. DILTIAZEM HCL CD 360 MG 24 hr capsule TAKE ONE CAPSULE EVERY DAY  30 capsule  2  . furosemide (LASIX) 40 MG tablet Take 40  mg by mouth daily.       Marland Kitchen. linagliptin (TRADJENTA) 5 MG TABS tablet Take 1 tablet (5 mg total) by mouth daily.  30 tablet  3  . metoprolol succinate (TOPROL-XL) 25 MG 24 hr tablet TAKE 3 TABLETS BY MOUTH EVERY DAY TAKE WITH OR IMMEDIATELY AFTER BREAKFAST  90 tablet  11  . omeprazole (PRILOSEC) 40 MG capsule TAKE ONE CAPSULE TWICE A DAY  60 capsule  6  . XARELTO 15 MG TABS tablet TAKE 1 TABLET BY MOUTH EVERY DAY WITH SUPPER  30 tablet  0   No current facility-administered medications for this visit.    Allergies:   Niaspan   Social History:  The patient  reports that he has quit smoking. His smoking use included Cigarettes. He has a 56 pack-year smoking history. He has quit using smokeless tobacco. His smokeless tobacco use included Chew. He reports that he does not drink alcohol or use illicit drugs.   Family History:  The patient's family history includes Diabetes in his mother; Heart attack in his father; Heart disease in his father.   ROS:  Please see the history of present illness.     All other systems reviewed and negative.   PHYSICAL EXAM: VS:  BP 140/70  Pulse 63  Ht 5' 6.5" (1.689 m)  Wt 145 lb (65.772 kg)  BMI 23.06 kg/m2 Well nourished, well developed, in no acute distress HEENT: normal Neck: no JVD Cardiac:  normal S1, S2; RRR; no murmur Lungs:  Decreased breath sounds, no wheezing or rales Abd: soft, nontender, no hepatomegaly Ext: trace  bilateral ankle edema Skin: warm and dry Neuro:  CNs 2-12 intact, no focal abnormalities noted  EKG:   NSR, HR 63, nonspecific ST-T wave changes, QTc 446 ms     ASSESSMENT AND PLAN:  1. Atrial Fibrillation:  Maintaining NSR.  Continue current dose of Amiodarone and Xarelto.  Creatinine Clearance is 26 (> 15 so ok to continue Xarelto 15 QD).  Check TSH today.  Recent LFTs ok.    2. Chronic Diastolic CHF:  Volume appears stable.  Agree with continuing Lasix for now.  Check BMET today.    3. Chronic Kidney Disease:  Recent creatinine  stable.  Repeat BMET today. 4. COPD:  F/u with primary care.  5. Hypertension:  Fair control.  Continue current Rx.  6. Hyperlipidemia: Managed by primary care.  Continue statin.  7. Disposition:  Follow up with Dr. Cassell Clementhomas Brackbill in 3 mos.   Signed, Tereso NewcomerScott Weaver, PA-C  11/23/2013 12:29 PM

## 2013-11-26 ENCOUNTER — Ambulatory Visit: Payer: Medicare Other | Admitting: Dietician

## 2013-12-01 ENCOUNTER — Encounter: Payer: Self-pay | Admitting: Family Medicine

## 2013-12-01 ENCOUNTER — Ambulatory Visit (INDEPENDENT_AMBULATORY_CARE_PROVIDER_SITE_OTHER): Payer: Medicare Other | Admitting: Family Medicine

## 2013-12-01 VITALS — BP 156/64 | HR 68 | Temp 97.0°F | Resp 20 | Ht 66.5 in | Wt 149.0 lb

## 2013-12-01 DIAGNOSIS — I509 Heart failure, unspecified: Secondary | ICD-10-CM

## 2013-12-01 LAB — BASIC METABOLIC PANEL
BUN: 26 mg/dL — AB (ref 6–23)
CO2: 23 mEq/L (ref 19–32)
CREATININE: 2.2 mg/dL — AB (ref 0.50–1.35)
Calcium: 9.2 mg/dL (ref 8.4–10.5)
Chloride: 100 mEq/L (ref 96–112)
GLUCOSE: 125 mg/dL — AB (ref 70–99)
POTASSIUM: 5 meq/L (ref 3.5–5.3)
Sodium: 136 mEq/L (ref 135–145)

## 2013-12-01 NOTE — Progress Notes (Signed)
Subjective:    Patient ID: Mark [name], male    DOB: 31-Jan-1946, 68 y.o.   MRN: [id number]  HPI  10/23/13 Patient was recently readmitted to the hospital with diastolic CHF exacerbation.  I have copied relevant portions of the discharge summary and have included them below: Admit date: 10/13/2013  Discharge date: 10/17/2013  Time spent: >30 minutes  Recommendations for Outpatient Follow-up:  1. BMET to follow renal function and electrolytes 2. Close follow up to CBG's and diabetes with adjustments in his meds as needed 3. Continue slowly weaning oxygen down as tolerated BNP    Component  Value  Date/Time    PROBNP  2580.0*  10/17/2013 0250    Filed Weights    10/15/13 0657  10/16/13 0555  10/17/13 0422   Weight:  65.681 kg (144 lb 12.8 oz)  65.454 kg (144 lb 4.8 oz)  64.864 kg (143 lb)   Discharge Diagnoses:  Acute on chronic resp failure due diastolic heart failure exacerbation  Acute on chronic diastolic CHF (congestive heart failure)  Atrial fibrillation  Long term (current) use of anticoagulants  COPD (chronic obstructive pulmonary disease)  Chronic kidney disease (CKD), stage III (moderate)  DM type 2 (A1C 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 Patient required to be discharge on oxygen especially for exertion due to O2 sat dropping below 88% on RA.   History of present illness:  68 y.o. male with Past medical history of diastolic dysfunction, COPD, hypertension, dyslipidemia, GERD, CKD, atrial fibrillation.  The patient is coming from home.  The patient presents with complaints of shortness of breath and orthopnea as well as PND. He mentions that he was discharged of Lasix recently. And last Thursday he went to see his doctor who placed him back on Lasix for 2 days and then ask him to stop taking. Since Saturday he has noted 3 pound weight gain along with worsening leg swelling and shortness of breath.  He denies any other change in his medication in  mentions he is compliant with his medication. He mentions he checks his weight and a daily basis.  Pt denies any fever, chills, headache, cough, chest pain, palpitation, nausea, vomiting, abdominal pain, diarrhea, constipation, active bleeding, burning urination, dizziness, pedal edema, focal neurological deficit.  Hospital Course:  Acute on chronic resp failure due to diastolic CHF exacerbation (congestive heart failure)  -will discharge on daily lasix 40mg   -low sodium diet  -encourage to stick to 1-1.2 L fluids intake  -we might need to get use of higher than usual Cr while helping controlling his volume and breathing  -patient on B-blocker and normal EF in Echo done Nov 2014  -neg troponin X 3  -weight is not reliable but patient neg 7L approx since admission and breathing a whole better and w/o crackles or JVD  -BNP lower as well at discharge  -stoking socks recommended to help with LE swelling  Chronic kidney disease (CKD), stage III (moderate)  -renal function continue to be stable, especially compare by GFR  -will monitor closely after discharge  -he might be now in 2.2 - 2.3 range at baseline  -at discharge 2.3  COPD (chronic obstructive pulmonary disease)  -continue home meds  -stable and currently w/o significant wheezing  -continue home oxygen  DM type 2:  -continue low carb diet  -A1C 7.5  -continue tradjenta  Long term (current) use of anticoagulants  -continue xarelto  -no overt bleeding  Atrial fibrillation  -status post ablation by  EP (patient back on a. Fib)  -on amiodarone, diltiazem, b-blocker and xarelto  -continue follow up with cardiology as an outpatient  -patient continue  AAA  -after discussing with vascular surgery they will continue schedule outpatient follow up.  Hyperkalemia  -resolved  -no abnormalities on telemetry or EKG  -will continue to monitor  He is here today for follow up.   By his scales at home the patient weighed 139 pounds when he  left the hospital. He was satting 98% on room air his breathing was much better. Since being home on 40 mg a day of Lasix he has gained 5 pounds and is 144 pounds by his home scales. Furthermore his breathing is worse and he is now 84% on room air. Here for shortness of breath and cough and orthopnea. He has +3 edema to his knees bilaterally. He has not been able to get the compression stockings. Furthermore he has run out of spiriva and symbicort.  At that time, my plan was: Chronic kidney disease (CKD), stage IV (severe) - Plan: BASIC METABOLIC PANEL WITH GFR  CHF (congestive heart failure)  COPD exacerbation  Increase Lasix to 80 mg by mouth daily. The patient is to take an extra 80 mg of Lasix today. Also recommended he start K-Dur 20 milliequivalents by mouth daily. I will check a CMP and a BNP today prior to beginning diuresis and on recheck the patient the first of next week. Also place the patient in unna boots on both legs to assist in diuresis. Hopefully after diuresis, when I remove the unna boots we can then place the patient in a medium-sized compression stocking which he is supposed to bring with him to his next appointment.  At the present time he needs diuresis and even if this creates worsening renal insufficiency he is having a difficult time breathing.  10/27/13 Over the weekend, according to the patient's home scales, he has lost from 144 pounds to 139 pounds. The swelling in his legs has improved dramatically. He has no further edema distal to his knee. His breathing is much better. He is satting 98% on 3 L via nasal cannula. He is 90% on room air after talking for approximately 10 minutes. He is also using Symbicort and spiriva.  My only concern is that his kidneys cannot tolerate the higher dose of Lasix. His been taking 80 mg of Lasix now every day for the last 4 days.  At that time, my plan was:  His respiratory status this is the best the patient has looked to me in several  months. I would like to continue his current medical regimen if his renal function can tolerate it. I will check a BMP today to evaluate his renal function as well as his potassium. If his creatinine is stable between 2.3 and 2.5, I will continue Lasix 80 mg every day and K-Dur 20 milliequivalents every day. I have also instructed the family and the patient monitor his weight. If his weight drops below 139 pounds, we will need to temporarily decrease the Lasix to 40 mg a day. He's going to have to monitor his weight daily and adjust his Lasix dose accordingly. If his renal function is stable I will see the patient back in 2 weeks. If he continues to do well, at that time, we will then try to bring the patient off oxygen and possibly discontinue spiriva.  We will then begin discussing treatment for diabetes.  11/13/13 Patient is here today for  follow up.  Today the patient is 142 lbs.   He has lost an additional 4 lbs from his last ov.  The patient had to decrease his Lasix to 40 mg a day due to losing an excessive amount of weight. His weight had dropped to 133 pounds by his home scales. Currently today it is 137 pounds. He has been stable on Lasix 40 mg a day for the last 2 weeks.  His breathing is much better. He is no longer on oxygen and he is satting 97% on room air. He denies any complications.     At that time, my plan was: Patient continues to do exceptionally well.  Continue Lasix 40 mg by mouth daily. I asked the patient to discontinue potassium unless he takes 80 mg of Lasix. I instructed the patient to discontinue Spiriva. He is to continue Symbicort 160/4.5 2 puffs inhaled twice a day.  I see the patient back in one month to recheck his hemoglobin A1c. The patient is now stable and can resume work. 12/01/13 Patient was seen March 9 at the cardiology office. At that time a creatinine was checked and found to be elevated at 2.7. They had the patient temporarily discontinue his Lasix for 2 days and  then reduce the dose to 20 mg a day. He was supposed to contact them if his weight increased 3 pounds and repeat a BMP in one week. Patient presents today with an oxygen saturation of 80%. He is reporting dyspnea on exertion and shortness of breath. He looks surprisingly well for such a low oxygen saturation. He is in no respiratory distress. He is not coughing. He denies any chest pain. He is mentating normally. However, his weight has risen 7 pounds according to my scales since his last office visit. He has +1 edema in both legs even wearing his compression stockings. He also has bibasilar crackles.  He has not notified the cardiologist of his weight gain.  Wt Readings from Last 3 Encounters:  12/01/13 149 lb (67.586 kg)  11/23/13 145 lb (65.772 kg)  11/13/13 142 lb (64.411 kg)    Past Medical History  Diagnosis Date  . GERD (gastroesophageal reflux disease)   . Hyperlipidemia   . H/O hiatal hernia   . Hypertension     Sees Dr. Lynnea Ferrier  . AAA (abdominal aortic aneurysm)   . COPD (chronic obstructive pulmonary disease)   . Diastolic heart failure     a.  Echo (08/13/2013): EF 55-60%, normal wall motion, mild MR, mild LAE  . Pneumonia   . Arthritis     "little in my hands" (09/08/2013)  . Chronic kidney disease (CKD), stage III (moderate)   . Atrial fibrillation     a. amiodarone Rx started 08/2013;  b. s/p TEE-DCCV 09/2013 => recurrent AFib => repeat DCCV => NSR;   c. Xarelto 15 QD due to CKD  . Hx of cardiovascular stress test     a. Myoview 09/2012:  no scar or ischemia   Current Outpatient Prescriptions on File Prior to Visit  Medication Sig Dispense Refill  . ADVAIR HFA 115-21 MCG/ACT inhaler Inhale 2 puffs into the lungs as needed.       Marland Kitchen albuterol (PROVENTIL HFA;VENTOLIN HFA) 108 (90 BASE) MCG/ACT inhaler Inhale 2 puffs into the lungs every 6 (six) hours as needed for wheezing or shortness of breath.  1 Inhaler  2  . amiodarone (PACERONE) 200 MG tablet Take 1 tablet (200  mg total) by mouth  daily.  30 tablet  11  . atorvastatin (LIPITOR) 40 MG tablet Take 1 tablet (40 mg total) by mouth daily.  30 tablet  5  . budesonide-formoterol (SYMBICORT) 160-4.5 MCG/ACT inhaler Inhale 2 puffs into the lungs 2 (two) times daily.      Marland Kitchen. DILTIAZEM HCL CD 360 MG 24 hr capsule TAKE ONE CAPSULE EVERY DAY  30 capsule  2  . furosemide (LASIX) 40 MG tablet Take 1/2 tablet ( 20 mg ) daily  30 tablet  6  . linagliptin (TRADJENTA) 5 MG TABS tablet Take 1 tablet (5 mg total) by mouth daily.  30 tablet  3  . metoprolol succinate (TOPROL-XL) 25 MG 24 hr tablet TAKE 3 TABLETS BY MOUTH EVERY DAY TAKE WITH OR IMMEDIATELY AFTER BREAKFAST  90 tablet  11  . omeprazole (PRILOSEC) 40 MG capsule TAKE ONE CAPSULE TWICE A DAY  60 capsule  6  . XARELTO 15 MG TABS tablet TAKE 1 TABLET BY MOUTH EVERY DAY WITH SUPPER  30 tablet  3   No current facility-administered medications on file prior to visit.   Allergies  Allergen Reactions  . Niaspan [Niacin Er] Anaphylaxis   History   Social History  . Marital Status: Married    Spouse Name: [name]    Number of Children: 4  . Years of Education: N/A   Occupational History  . Works PT as a Education administratorpainter    Social History Main Topics  . Smoking status: Former Smoker -- 1.00 packs/day for 56 years    Types: Cigarettes  . Smokeless tobacco: Former NeurosurgeonUser    Types: Chew     Comment: 09/08/2013 "stopped smoking cigarettes ~ 2 months ago; aien't chew in probably 10 yrs"  . Alcohol Use: No  . Drug Use: No  . Sexual Activity: Not Currently   Other Topics Concern  . Not on file   Social History Narrative   Married.  Lives with wife.  Ambulates without assistance.     Review of Systems  All other systems reviewed and are negative.       Objective:   Physical Exam  Vitals reviewed. Constitutional: He appears well-developed and well-nourished. No distress.  HENT:  Mouth/Throat: Oropharynx is clear and moist.  Eyes: Conjunctivae are normal. No  scleral icterus.  Neck: Neck supple. No JVD present.  Cardiovascular: Normal rate, regular rhythm and normal heart sounds.   No murmur heard. Pulmonary/Chest: Effort normal. He has no wheezes. He has rales.  Abdominal: Soft. Bowel sounds are normal. He exhibits no distension. There is no tenderness. There is no rebound and no guarding.  Musculoskeletal: He exhibits edema.  Lymphadenopathy:    He has no cervical adenopathy.  Skin: He is not diaphoretic.          Assessment & Plan:  CHF (congestive heart failure) - Plan: Basic Metabolic Panel  increase Lasix to 80 mg x1 today and then resume 40 mg via daily thereafter. I would check a BMP today and recheck a BMP in one week. I believe we are going to have to accept a creatinine between 2-3 in this patient. He seems to require 40 mg of Lasix daily to prevent pulmonary edema even though that causes his creatinine to rise slightly.  The patient is to recheck with me in 48 hours if no better or sooner if worse.

## 2013-12-03 ENCOUNTER — Other Ambulatory Visit: Payer: Medicare Other

## 2013-12-09 ENCOUNTER — Telehealth: Payer: Self-pay | Admitting: Family Medicine

## 2013-12-09 NOTE — Telephone Encounter (Signed)
Submitted PA through [email or web address]

## 2013-12-10 ENCOUNTER — Encounter: Payer: Self-pay | Admitting: Family Medicine

## 2013-12-10 ENCOUNTER — Ambulatory Visit (INDEPENDENT_AMBULATORY_CARE_PROVIDER_SITE_OTHER): Payer: Medicare Other | Admitting: Family Medicine

## 2013-12-10 VITALS — BP 160/70 | HR 60 | Temp 96.8°F | Resp 22 | Ht 66.5 in | Wt 148.0 lb

## 2013-12-10 DIAGNOSIS — R0600 Dyspnea, unspecified: Secondary | ICD-10-CM

## 2013-12-10 DIAGNOSIS — R0609 Other forms of dyspnea: Secondary | ICD-10-CM

## 2013-12-10 DIAGNOSIS — R0989 Other specified symptoms and signs involving the circulatory and respiratory systems: Secondary | ICD-10-CM

## 2013-12-10 LAB — BASIC METABOLIC PANEL WITH GFR
BUN: 29 mg/dL — ABNORMAL HIGH (ref 6–23)
CALCIUM: 9.2 mg/dL (ref 8.4–10.5)
CO2: 27 mEq/L (ref 19–32)
Chloride: 99 mEq/L (ref 96–112)
Creat: 2.65 mg/dL — ABNORMAL HIGH (ref 0.50–1.35)
GFR, EST AFRICAN AMERICAN: 27 mL/min — AB
GFR, EST NON AFRICAN AMERICAN: 24 mL/min — AB
GLUCOSE: 114 mg/dL — AB (ref 70–99)
POTASSIUM: 5 meq/L (ref 3.5–5.3)
SODIUM: 138 meq/L (ref 135–145)

## 2013-12-10 MED ORDER — TIOTROPIUM BROMIDE MONOHYDRATE 18 MCG IN CAPS: 18.0000 ug | ORAL_CAPSULE | Freq: Every day | RESPIRATORY_TRACT | Status: DC

## 2013-12-10 NOTE — Progress Notes (Signed)
Subjective:    Patient ID: Mark [name], male    DOB: [birth year], 68 y.o.   MRN: [id number]  Shortness of Breath   10/23/13 Patient was recently readmitted to the hospital with diastolic CHF exacerbation.  I have copied relevant portions of the discharge summary and have included them below: Admit date: 10/13/2013  Discharge date: 10/17/2013  Time spent: >30 minutes  Recommendations for Outpatient Follow-up:  1. BMET to follow renal function and electrolytes 2. Close follow up to CBG's and diabetes with adjustments in his meds as needed 3. Continue slowly weaning oxygen down as tolerated BNP    Component  Value  Date/Time    PROBNP  2580.0*  10/17/2013 0250    Filed Weights    10/15/13 0657  10/16/13 0555  10/17/13 0422   Weight:  65.681 kg (144 lb 12.8 oz)  65.454 kg (144 lb 4.8 oz)  64.864 kg (143 lb)   Discharge Diagnoses:  Acute on chronic resp failure due diastolic heart failure exacerbation  Acute on chronic diastolic CHF (congestive heart failure)  Atrial fibrillation  Long term (current) use of anticoagulants  COPD (chronic obstructive pulmonary disease)  Chronic kidney disease (CKD), stage III (moderate)  DM type 2 (A1C 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 Patient required to be discharge on oxygen especially for exertion due to O2 sat dropping below 88% on RA.   History of present illness:  68 y.o. male with Past medical history of diastolic dysfunction, COPD, hypertension, dyslipidemia, GERD, CKD, atrial fibrillation.  The patient is coming from home.  The patient presents with complaints of shortness of breath and orthopnea as well as PND. He mentions that he was discharged of Lasix recently. And last Thursday he went to see his doctor who placed him back on Lasix for 2 days and then ask him to stop taking. Since Saturday he has noted 3 pound weight gain along with worsening leg swelling and shortness of breath.  He denies any other change in his  medication in mentions he is compliant with his medication. He mentions he checks his weight and a daily basis.  Pt denies any fever, chills, headache, cough, chest pain, palpitation, nausea, vomiting, abdominal pain, diarrhea, constipation, active bleeding, burning urination, dizziness, pedal edema, focal neurological deficit.  Hospital Course:  Acute on chronic resp failure due to diastolic CHF exacerbation (congestive heart failure)  -will discharge on daily lasix 40mg   -low sodium diet  -encourage to stick to 1-1.2 L fluids intake  -we might need to get use of higher than usual Cr while helping controlling his volume and breathing  -patient on B-blocker and normal EF in Echo done Nov 2014  -neg troponin X 3  -weight is not reliable but patient neg 7L approx since admission and breathing a whole better and w/o crackles or JVD  -BNP lower as well at discharge  -stoking socks recommended to help with LE swelling  Chronic kidney disease (CKD), stage III (moderate)  -renal function continue to be stable, especially compare by GFR  -will monitor closely after discharge  -he might be now in 2.2 - 2.3 range at baseline  -at discharge 2.3  COPD (chronic obstructive pulmonary disease)  -continue home meds  -stable and currently w/o significant wheezing  -continue home oxygen  DM type 2:  -continue low carb diet  -A1C 7.5  -continue tradjenta  Long term (current) use of anticoagulants  -continue xarelto  -no overt bleeding  Atrial fibrillation  -status  post ablation by EP (patient back on a. Fib)  -on amiodarone, diltiazem, b-blocker and xarelto  -continue follow up with cardiology as an outpatient  -patient continue  AAA  -after discussing with vascular surgery they will continue schedule outpatient follow up.  Hyperkalemia  -resolved  -no abnormalities on telemetry or EKG  -will continue to monitor  He is here today for follow up.   By his scales at home the patient weighed 139  pounds when he left the hospital. He was satting 98% on room air his breathing was much better. Since being home on 40 mg a day of Lasix he has gained 5 pounds and is 144 pounds by his home scales. Furthermore his breathing is worse and he is now 84% on room air. Here for shortness of breath and cough and orthopnea. He has +3 edema to his knees bilaterally. He has not been able to get the compression stockings. Furthermore he has run out of spiriva and symbicort.  At that time, my plan was: Chronic kidney disease (CKD), stage IV (severe) - Plan: BASIC METABOLIC PANEL WITH GFR  CHF (congestive heart failure)  COPD exacerbation  Increase Lasix to 80 mg by mouth daily. The patient is to take an extra 80 mg of Lasix today. Also recommended he start K-Dur 20 milliequivalents by mouth daily. I will check a CMP and a BNP today prior to beginning diuresis and on recheck the patient the first of next week. Also place the patient in unna boots on both legs to assist in diuresis. Hopefully after diuresis, when I remove the unna boots we can then place the patient in a medium-sized compression stocking which he is supposed to bring with him to his next appointment.  At the present time he needs diuresis and even if this creates worsening renal insufficiency he is having a difficult time breathing.  10/27/13 Over the weekend, according to the patient's home scales, he has lost from 144 pounds to 139 pounds. The swelling in his legs has improved dramatically. He has no further edema distal to his knee. His breathing is much better. He is satting 98% on 3 L via nasal cannula. He is 90% on room air after talking for approximately 10 minutes. He is also using Symbicort and spiriva.  My only concern is that his kidneys cannot tolerate the higher dose of Lasix. His been taking 80 mg of Lasix now every day for the last 4 days.  At that time, my plan was:  His respiratory status this is the best the patient has looked to me  in several months. I would like to continue his current medical regimen if his renal function can tolerate it. I will check a BMP today to evaluate his renal function as well as his potassium. If his creatinine is stable between 2.3 and 2.5, I will continue Lasix 80 mg every day and K-Dur 20 milliequivalents every day. I have also instructed the family and the patient monitor his weight. If his weight drops below 139 pounds, we will need to temporarily decrease the Lasix to 40 mg a day. He's going to have to monitor his weight daily and adjust his Lasix dose accordingly. If his renal function is stable I will see the patient back in 2 weeks. If he continues to do well, at that time, we will then try to bring the patient off oxygen and possibly discontinue spiriva.  We will then begin discussing treatment for diabetes.  11/13/13 Patient is  here today for follow up.  Today the patient is 142 lbs.   He has lost an additional 4 lbs from his last ov.  The patient had to decrease his Lasix to 40 mg a day due to losing an excessive amount of weight. His weight had dropped to 133 pounds by his home scales. Currently today it is 137 pounds. He has been stable on Lasix 40 mg a day for the last 2 weeks.  His breathing is much better. He is no longer on oxygen and he is satting 97% on room air. He denies any complications.     At that time, my plan was: Patient continues to do exceptionally well.  Continue Lasix 40 mg by mouth daily. I asked the patient to discontinue potassium unless he takes 80 mg of Lasix. I instructed the patient to discontinue Spiriva. He is to continue Symbicort 160/4.5 2 puffs inhaled twice a day.  I see the patient back in one month to recheck his hemoglobin A1c. The patient is now stable and can resume work. 12/01/13 Patient was seen March 9 at the cardiology office. At that time a creatinine was checked and found to be elevated at 2.7. They had the patient temporarily discontinue his Lasix for 2  days and then reduce the dose to 20 mg a day. He was supposed to contact them if his weight increased 3 pounds and repeat a BMP in one week. Patient presents today with an oxygen saturation of 80%. He is reporting dyspnea on exertion and shortness of breath. He looks surprisingly well for such a low oxygen saturation. He is in no respiratory distress. He is not coughing. He denies any chest pain. He is mentating normally. However, his weight has risen 7 pounds according to my scales since his last office visit. He has +1 edema in both legs even wearing his compression stockings. He also has bibasilar crackles.  He has not notified the cardiologist of his weight gain.  At that time, my plan was: increase Lasix to 80 mg x1 today and then resume 40 mg via daily thereafter. I would check a BMP today and recheck a BMP in one week. I believe we are going to have to accept a creatinine between 2-3 in this patient. He seems to require 40 mg of Lasix daily to prevent pulmonary edema even though that causes his creatinine to rise slightly.  The patient is to recheck with me in 48 hours if no better or sooner if worse.  12/10/13 His weight is down 1 pound since last office visit.  He continues to report dyspnea on exertion and shortness of breath. His oxygen saturation is frequently 70-80% when he first wakes up in the morning. Deep inhalation he can raise it to 90%. He denies any cough or fever or pleurisy. He does report congestion in his chest that "he can't seem to bring up."  He denies any chest pain. He denies any hemoptysis.  On examination today he has trace bipedal edema and faint bibasilar crackles  Wt Readings from Last 3 Encounters:  12/10/13 148 lb (67.132 kg)  12/01/13 149 lb (67.586 kg)  11/23/13 145 lb (65.772 kg)    Past Medical History  Diagnosis Date  . GERD (gastroesophageal reflux disease)   . Hyperlipidemia   . H/O hiatal hernia   . Hypertension     Sees Dr. Lynnea Ferrier  . AAA  (abdominal aortic aneurysm)   . COPD (chronic obstructive pulmonary disease)   .  Diastolic heart failure     a.  Echo (08/13/2013): EF 55-60%, normal wall motion, mild MR, mild LAE  . Pneumonia   . Arthritis     "little in my hands" (09/08/2013)  . Chronic kidney disease (CKD), stage III (moderate)   . Atrial fibrillation     a. amiodarone Rx started 08/2013;  b. s/p TEE-DCCV 09/2013 => recurrent AFib => repeat DCCV => NSR;   c. Xarelto 15 QD due to CKD  . Hx of cardiovascular stress test     a. Myoview 09/2012:  no scar or ischemia   Current Outpatient Prescriptions on File Prior to Visit  Medication Sig Dispense Refill  . albuterol (PROVENTIL HFA;VENTOLIN HFA) 108 (90 BASE) MCG/ACT inhaler Inhale 2 puffs into the lungs every 6 (six) hours as needed for wheezing or shortness of breath.  1 Inhaler  2  . amiodarone (PACERONE) 200 MG tablet Take 1 tablet (200 mg total) by mouth daily.  30 tablet  11  . atorvastatin (LIPITOR) 40 MG tablet Take 1 tablet (40 mg total) by mouth daily.  30 tablet  5  . budesonide-formoterol (SYMBICORT) 160-4.5 MCG/ACT inhaler Inhale 2 puffs into the lungs 2 (two) times daily.      Marland Kitchen DILTIAZEM HCL CD 360 MG 24 hr capsule TAKE ONE CAPSULE EVERY DAY  30 capsule  2  . furosemide (LASIX) 40 MG tablet Take 40 mg by mouth daily.   30 tablet  6  . linagliptin (TRADJENTA) 5 MG TABS tablet Take 1 tablet (5 mg total) by mouth daily.  30 tablet  3  . metoprolol succinate (TOPROL-XL) 25 MG 24 hr tablet TAKE 3 TABLETS BY MOUTH EVERY DAY TAKE WITH OR IMMEDIATELY AFTER BREAKFAST  90 tablet  11  . omeprazole (PRILOSEC) 40 MG capsule TAKE ONE CAPSULE TWICE A DAY  60 capsule  6  . XARELTO 15 MG TABS tablet TAKE 1 TABLET BY MOUTH EVERY DAY WITH SUPPER  30 tablet  3   No current facility-administered medications on file prior to visit.   Allergies  Allergen Reactions  . Niaspan [Niacin Er] Anaphylaxis   History   Social History  . Marital Status: Married    Spouse Name:  Nicole Cella    Number of Children: 4  . Years of Education: N/A   Occupational History  . Works PT as a Education administrator    Social History Main Topics  . Smoking status: Former Smoker -- 1.00 packs/day for 56 years    Types: Cigarettes  . Smokeless tobacco: Former Neurosurgeon    Types: Chew     Comment: 09/08/2013 "stopped smoking cigarettes ~ 2 months ago; aien't chew in probably 10 yrs"  . Alcohol Use: No  . Drug Use: No  . Sexual Activity: Not Currently   Other Topics Concern  . Not on file   Social History Narrative   Married.  Lives with wife.  Ambulates without assistance.     Review of Systems  Respiratory: Positive for shortness of breath.   All other systems reviewed and are negative.       Objective:   Physical Exam  Vitals reviewed. Constitutional: He appears well-developed and well-nourished. No distress.  HENT:  Mouth/Throat: Oropharynx is clear and moist.  Eyes: Conjunctivae are normal. No scleral icterus.  Neck: Neck supple. No JVD present.  Cardiovascular: Normal rate, regular rhythm and normal heart sounds.   No murmur heard. Pulmonary/Chest: Effort normal. He has no wheezes. He has rales.  Abdominal: Soft. Bowel  sounds are normal. He exhibits no distension. There is no tenderness. There is no rebound and no guarding.  Musculoskeletal: He exhibits edema.  Lymphadenopathy:    He has no cervical adenopathy.  Skin: He is not diaphoretic.          Assessment & Plan:  1. Dyspnea Debris the patient is fluid overloaded. I recommended Lasix 80 mg by mouth daily and K-Dur 20 milliequivalents by mouth daily for the next hours. He is to call me back after 48 hours. Continue aggressive diuresis until he begins to lose weight. I believe he has 6 extra pounds that is fluid retention. I also have the patient resume Spiriva 1 inhalation daily for his COPD. I have asked him to continue Symbicort. I will check a BMP and a BNP. I anticipate that will show fluid overload.  Recheck  Monday. - BASIC METABOLIC PANEL WITH GFR - Pro b natriuretic peptide (BNP)

## 2013-12-11 ENCOUNTER — Ambulatory Visit: Payer: Medicare Other | Admitting: Family Medicine

## 2013-12-11 LAB — PRO B NATRIURETIC PEPTIDE: Pro B Natriuretic peptide (BNP): 4505 pg/mL — ABNORMAL HIGH (ref <126)

## 2013-12-11 NOTE — Telephone Encounter (Signed)
Medication approved through 09/16/14 - pharmacy called and made aware of approval. 

## 2013-12-14 ENCOUNTER — Ambulatory Visit (INDEPENDENT_AMBULATORY_CARE_PROVIDER_SITE_OTHER): Payer: Medicare Other | Admitting: Family Medicine

## 2013-12-14 ENCOUNTER — Encounter: Payer: Self-pay | Admitting: Family Medicine

## 2013-12-14 VITALS — BP 150/80 | HR 68 | Temp 96.9°F | Resp 20 | Ht 66.5 in | Wt 149.0 lb

## 2013-12-14 DIAGNOSIS — R0609 Other forms of dyspnea: Secondary | ICD-10-CM

## 2013-12-14 DIAGNOSIS — R0600 Dyspnea, unspecified: Secondary | ICD-10-CM

## 2013-12-14 DIAGNOSIS — R0989 Other specified symptoms and signs involving the circulatory and respiratory systems: Secondary | ICD-10-CM

## 2013-12-14 LAB — COMPLETE METABOLIC PANEL WITH GFR
ALT: 14 U/L (ref 0–53)
AST: 15 U/L (ref 0–37)
Albumin: 3.7 g/dL (ref 3.5–5.2)
Alkaline Phosphatase: 78 U/L (ref 39–117)
BUN: 31 mg/dL — ABNORMAL HIGH (ref 6–23)
CALCIUM: 8.8 mg/dL (ref 8.4–10.5)
CHLORIDE: 101 meq/L (ref 96–112)
CO2: 25 meq/L (ref 19–32)
CREATININE: 2.73 mg/dL — AB (ref 0.50–1.35)
GFR, EST AFRICAN AMERICAN: 26 mL/min — AB
GFR, EST NON AFRICAN AMERICAN: 23 mL/min — AB
Glucose, Bld: 100 mg/dL — ABNORMAL HIGH (ref 70–99)
Potassium: 3.9 mEq/L (ref 3.5–5.3)
Sodium: 139 mEq/L (ref 135–145)
TOTAL PROTEIN: 6.8 g/dL (ref 6.0–8.3)
Total Bilirubin: 0.4 mg/dL (ref 0.2–1.2)

## 2013-12-14 NOTE — Progress Notes (Signed)
Subjective:    Patient ID: Mark [name], male    DOB: [birth year], 68 y.o.   MRN: [id number]  Shortness of Breath   10/23/13 Patient was recently readmitted to the hospital with diastolic CHF exacerbation.  I have copied relevant portions of the discharge summary and have included them below: Admit date: 10/13/2013  Discharge date: 10/17/2013  Time spent: >30 minutes  Recommendations for Outpatient Follow-up:  1. BMET to follow renal function and electrolytes 2. Close follow up to CBG's and diabetes with adjustments in his meds as needed 3. Continue slowly weaning oxygen down as tolerated BNP    Component  Value  Date/Time    PROBNP  2580.0*  10/17/2013 0250    Filed Weights    10/15/13 0657  10/16/13 0555  10/17/13 0422   Weight:  65.681 kg (144 lb 12.8 oz)  65.454 kg (144 lb 4.8 oz)  64.864 kg (143 lb)   Discharge Diagnoses:  Acute on chronic resp failure due diastolic heart failure exacerbation  Acute on chronic diastolic CHF (congestive heart failure)  Atrial fibrillation  Long term (current) use of anticoagulants  COPD (chronic obstructive pulmonary disease)  Chronic kidney disease (CKD), stage III (moderate)  DM type 2 (A1C 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 Patient required to be discharge on oxygen especially for exertion due to O2 sat dropping below 88% on RA.   History of present illness:  68 y.o. male with Past medical history of diastolic dysfunction, COPD, hypertension, dyslipidemia, GERD, CKD, atrial fibrillation.  The patient is coming from home.  The patient presents with complaints of shortness of breath and orthopnea as well as PND. He mentions that he was discharged of Lasix recently. And last Thursday he went to see his doctor who placed him back on Lasix for 2 days and then ask him to stop taking. Since Saturday he has noted 3 pound weight gain along with worsening leg swelling and shortness of breath.  He denies any other change in his  medication in mentions he is compliant with his medication. He mentions he checks his weight and a daily basis.  Pt denies any fever, chills, headache, cough, chest pain, palpitation, nausea, vomiting, abdominal pain, diarrhea, constipation, active bleeding, burning urination, dizziness, pedal edema, focal neurological deficit.  Hospital Course:  Acute on chronic resp failure due to diastolic CHF exacerbation (congestive heart failure)  -will discharge on daily lasix 40mg   -low sodium diet  -encourage to stick to 1-1.2 L fluids intake  -we might need to get use of higher than usual Cr while helping controlling his volume and breathing  -patient on B-blocker and normal EF in Echo done Nov 2014  -neg troponin X 3  -weight is not reliable but patient neg 7L approx since admission and breathing a whole better and w/o crackles or JVD  -BNP lower as well at discharge  -stoking socks recommended to help with LE swelling  Chronic kidney disease (CKD), stage III (moderate)  -renal function continue to be stable, especially compare by GFR  -will monitor closely after discharge  -he might be now in 2.2 - 2.3 range at baseline  -at discharge 2.3  COPD (chronic obstructive pulmonary disease)  -continue home meds  -stable and currently w/o significant wheezing  -continue home oxygen  DM type 2:  -continue low carb diet  -A1C 7.5  -continue tradjenta  Long term (current) use of anticoagulants  -continue xarelto  -no overt bleeding  Atrial fibrillation  -status  post ablation by EP (patient back on a. Fib)  -on amiodarone, diltiazem, b-blocker and xarelto  -continue follow up with cardiology as an outpatient  -patient continue  AAA  -after discussing with vascular surgery they will continue schedule outpatient follow up.  Hyperkalemia  -resolved  -no abnormalities on telemetry or EKG  -will continue to monitor  He is here today for follow up.   By his scales at home the patient weighed 139  pounds when he left the hospital. He was satting 98% on room air his breathing was much better. Since being home on 40 mg a day of Lasix he has gained 5 pounds and is 144 pounds by his home scales. Furthermore his breathing is worse and he is now 84% on room air. Here for shortness of breath and cough and orthopnea. He has +3 edema to his knees bilaterally. He has not been able to get the compression stockings. Furthermore he has run out of spiriva and symbicort.  At that time, my plan was: Chronic kidney disease (CKD), stage IV (severe) - Plan: BASIC METABOLIC PANEL WITH GFR  CHF (congestive heart failure)  COPD exacerbation  Increase Lasix to 80 mg by mouth daily. The patient is to take an extra 80 mg of Lasix today. Also recommended he start K-Dur 20 milliequivalents by mouth daily. I will check a CMP and a BNP today prior to beginning diuresis and on recheck the patient the first of next week. Also place the patient in unna boots on both legs to assist in diuresis. Hopefully after diuresis, when I remove the unna boots we can then place the patient in a medium-sized compression stocking which he is supposed to bring with him to his next appointment.  At the present time he needs diuresis and even if this creates worsening renal insufficiency he is having a difficult time breathing.  10/27/13 Over the weekend, according to the patient's home scales, he has lost from 144 pounds to 139 pounds. The swelling in his legs has improved dramatically. He has no further edema distal to his knee. His breathing is much better. He is satting 98% on 3 L via nasal cannula. He is 90% on room air after talking for approximately 10 minutes. He is also using Symbicort and spiriva.  My only concern is that his kidneys cannot tolerate the higher dose of Lasix. His been taking 80 mg of Lasix now every day for the last 4 days.  At that time, my plan was:  His respiratory status this is the best the patient has looked to me  in several months. I would like to continue his current medical regimen if his renal function can tolerate it. I will check a BMP today to evaluate his renal function as well as his potassium. If his creatinine is stable between 2.3 and 2.5, I will continue Lasix 80 mg every day and K-Dur 20 milliequivalents every day. I have also instructed the family and the patient monitor his weight. If his weight drops below 139 pounds, we will need to temporarily decrease the Lasix to 40 mg a day. He's going to have to monitor his weight daily and adjust his Lasix dose accordingly. If his renal function is stable I will see the patient back in 2 weeks. If he continues to do well, at that time, we will then try to bring the patient off oxygen and possibly discontinue spiriva.  We will then begin discussing treatment for diabetes.  11/13/13 Patient is  here today for follow up.  Today the patient is 142 lbs.   He has lost an additional 4 lbs from his last ov.  The patient had to decrease his Lasix to 40 mg a day due to losing an excessive amount of weight. His weight had dropped to 133 pounds by his home scales. Currently today it is 137 pounds. He has been stable on Lasix 40 mg a day for the last 2 weeks.  His breathing is much better. He is no longer on oxygen and he is satting 97% on room air. He denies any complications.     At that time, my plan was: Patient continues to do exceptionally well.  Continue Lasix 40 mg by mouth daily. I asked the patient to discontinue potassium unless he takes 80 mg of Lasix. I instructed the patient to discontinue Spiriva. He is to continue Symbicort 160/4.5 2 puffs inhaled twice a day.  I see the patient back in one month to recheck his hemoglobin A1c. The patient is now stable and can resume work. 12/01/13 Patient was seen March 9 at the cardiology office. At that time a creatinine was checked and found to be elevated at 2.7. They had the patient temporarily discontinue his Lasix for 2  days and then reduce the dose to 20 mg a day. He was supposed to contact them if his weight increased 3 pounds and repeat a BMP in one week. Patient presents today with an oxygen saturation of 80%. He is reporting dyspnea on exertion and shortness of breath. He looks surprisingly well for such a low oxygen saturation. He is in no respiratory distress. He is not coughing. He denies any chest pain. He is mentating normally. However, his weight has risen 7 pounds according to my scales since his last office visit. He has +1 edema in both legs even wearing his compression stockings. He also has bibasilar crackles.  He has not notified the cardiologist of his weight gain.  At that time, my plan was: increase Lasix to 80 mg x1 today and then resume 40 mg via daily thereafter. I would check a BMP today and recheck a BMP in one week. I believe we are going to have to accept a creatinine between 2-3 in this patient. He seems to require 40 mg of Lasix daily to prevent pulmonary edema even though that causes his creatinine to rise slightly.  The patient is to recheck with me in 48 hours if no better or sooner if worse.  12/10/13 His weight is down 1 pound since last office visit.  He continues to report dyspnea on exertion and shortness of breath. His oxygen saturation is frequently 70-80% when he first wakes up in the morning. Deep inhalation he can raise it to 90%. He denies any cough or fever or pleurisy. He does report congestion in his chest that "he can't seem to bring up."  He denies any chest pain. He denies any hemoptysis.  On examination today he has trace bipedal edema and faint bibasilar crackles.  At that time, my plan was: 1. Dyspnea I believe the patient is fluid overloaded. I recommended Lasix 80 mg by mouth daily and K-Dur 20 milliequivalents by mouth daily for the next 48 hours. He is to call me back after 48 hours. Continue aggressive diuresis until he begins to lose weight. I believe he has 6 extra  pounds that is fluid retention. I also have the patient resume Spiriva 1 inhalation daily for his COPD.  I have asked him to continue Symbicort. I will check a BMP and a BNP. I anticipate that will show fluid overload.  Recheck Monday. - BASIC METABOLIC PANEL WITH GFR - Pro b natriuretic peptide (BNP)  12/14/13 Patient lost 2 pounds after the first day.  I had the patient continue Lasix 80 mg by mouth daily along with potassium 20 mEq by mouth daily for Thursday Friday. On Saturday and Sunday the patient took 40 mg of Lasix. He is here today to recheck. Of note, his creatinine was slightly elevated on Thursday at 2.65, but his BNP was elevated at 4600.  The patient's weight is actually 1 pound greater than it was last week at 149 pounds. He has not lost any weight. However his breathing is subjectively better. This seems to coincide with resumption of spiriva.  He denies any orthopnea. His oxygen saturation is between 89 and 90% which is better than it was last week.  Wt Readings from Last 3 Encounters:  12/10/13 148 lb (67.132 kg)  12/01/13 149 lb (67.586 kg)  11/23/13 145 lb (65.772 kg)    Past Medical History  Diagnosis Date  . GERD (gastroesophageal reflux disease)   . Hyperlipidemia   . H/O hiatal hernia   . Hypertension     Sees Dr. Lynnea Ferrier  . AAA (abdominal aortic aneurysm)   . COPD (chronic obstructive pulmonary disease)   . Diastolic heart failure     a.  Echo (08/13/2013): EF 55-60%, normal wall motion, mild MR, mild LAE  . Pneumonia   . Arthritis     "little in my hands" (09/08/2013)  . Chronic kidney disease (CKD), stage III (moderate)   . Atrial fibrillation     a. amiodarone Rx started 08/2013;  b. s/p TEE-DCCV 09/2013 => recurrent AFib => repeat DCCV => NSR;   c. Xarelto 15 QD due to CKD  . Hx of cardiovascular stress test     a. Myoview 09/2012:  no scar or ischemia   Current Outpatient Prescriptions on File Prior to Visit  Medication Sig Dispense Refill  .  albuterol (PROVENTIL HFA;VENTOLIN HFA) 108 (90 BASE) MCG/ACT inhaler Inhale 2 puffs into the lungs every 6 (six) hours as needed for wheezing or shortness of breath.  1 Inhaler  2  . amiodarone (PACERONE) 200 MG tablet Take 1 tablet (200 mg total) by mouth daily.  30 tablet  11  . atorvastatin (LIPITOR) 40 MG tablet Take 1 tablet (40 mg total) by mouth daily.  30 tablet  5  . budesonide-formoterol (SYMBICORT) 160-4.5 MCG/ACT inhaler Inhale 2 puffs into the lungs 2 (two) times daily.      Marland Kitchen DILTIAZEM HCL CD 360 MG 24 hr capsule TAKE ONE CAPSULE EVERY DAY  30 capsule  2  . furosemide (LASIX) 40 MG tablet Take 40 mg by mouth daily.   30 tablet  6  . linagliptin (TRADJENTA) 5 MG TABS tablet Take 1 tablet (5 mg total) by mouth daily.  30 tablet  3  . metoprolol succinate (TOPROL-XL) 25 MG 24 hr tablet TAKE 3 TABLETS BY MOUTH EVERY DAY TAKE WITH OR IMMEDIATELY AFTER BREAKFAST  90 tablet  11  . omeprazole (PRILOSEC) 40 MG capsule TAKE ONE CAPSULE TWICE A DAY  60 capsule  6  . tiotropium (SPIRIVA HANDIHALER) 18 MCG inhalation capsule Place 1 capsule (18 mcg total) into inhaler and inhale daily.  30 capsule  5  . XARELTO 15 MG TABS tablet TAKE 1 TABLET BY MOUTH EVERY  DAY WITH SUPPER  30 tablet  3   No current facility-administered medications on file prior to visit.   Allergies  Allergen Reactions  . Niaspan [Niacin Er] Anaphylaxis   History   Social History  . Marital Status: Married    Spouse Name: [name]    Number of Children: 4  . Years of Education: N/A   Occupational History  . Works PT as a Education administratorpainter    Social History Main Topics  . Smoking status: Former Smoker -- 1.00 packs/day for 56 years    Types: Cigarettes  . Smokeless tobacco: Former NeurosurgeonUser    Types: Chew     Comment: 09/08/2013 "stopped smoking cigarettes ~ 2 months ago; aien't chew in probably 10 yrs"  . Alcohol Use: No  . Drug Use: No  . Sexual Activity: Not Currently   Other Topics Concern  . Not on file   Social  History Narrative   Married.  Lives with wife.  Ambulates without assistance.     Review of Systems  Respiratory: Positive for shortness of breath.   All other systems reviewed and are negative.       Objective:   Physical Exam  Vitals reviewed. Constitutional: He appears well-developed and well-nourished. No distress.  HENT:  Mouth/Throat: Oropharynx is clear and moist.  Eyes: Conjunctivae are normal. No scleral icterus.  Neck: Neck supple. No JVD present.  Cardiovascular: Normal rate, regular rhythm and normal heart sounds.   No murmur heard. Pulmonary/Chest: Effort normal. He has no wheezes. He has rales.  Abdominal: Soft. Bowel sounds are normal. He exhibits no distension. There is no tenderness. There is no rebound and no guarding.  Musculoskeletal: He exhibits edema.  Lymphadenopathy:    He has no cervical adenopathy.  Skin: He is not diaphoretic.   patient has trace bipedal edema and fine bibasilar crackles which are chronic and have not changed since Thursday       Assessment & Plan:  1. Dyspnea Patient's breathing is better but he does not seem to have improved with diuresis. I believe the improvement is due to the resumption of spiriva.  Continue Symbicort and spiriva and Lasix 40 mg by mouth daily. Check creatinine to make sure his renal function is stable as well as a pro BNP to establish his baseline. We are going to recheck the patient in one week. - COMPLETE METABOLIC PANEL WITH GFR - Pro b natriuretic peptide (BNP)

## 2013-12-15 LAB — PRO B NATRIURETIC PEPTIDE: Pro B Natriuretic peptide (BNP): 3274 pg/mL — ABNORMAL HIGH (ref <126)

## 2013-12-22 ENCOUNTER — Encounter: Payer: Self-pay | Admitting: Family Medicine

## 2013-12-22 ENCOUNTER — Ambulatory Visit (INDEPENDENT_AMBULATORY_CARE_PROVIDER_SITE_OTHER): Payer: Medicare Other | Admitting: Family Medicine

## 2013-12-22 ENCOUNTER — Telehealth: Payer: Self-pay | Admitting: Family Medicine

## 2013-12-22 VITALS — BP 142/64 | HR 80 | Temp 97.1°F | Resp 20 | Ht 66.5 in | Wt 150.0 lb

## 2013-12-22 DIAGNOSIS — J441 Chronic obstructive pulmonary disease with (acute) exacerbation: Secondary | ICD-10-CM

## 2013-12-22 MED ORDER — PREDNISONE 20 MG PO TABS: ORAL_TABLET | ORAL | Status: DC

## 2013-12-22 MED ORDER — AZITHROMYCIN 250 MG PO TABS: ORAL_TABLET | ORAL | Status: DC

## 2013-12-22 NOTE — Telephone Encounter (Signed)
Pts called and states that is DILTIAZEM HCL CD 360 MG 24 hr capsule has gone up to 170.00 out of pocket and would like to know if there is something cheaper he can take???

## 2013-12-22 NOTE — Progress Notes (Signed)
Subjective:    Patient ID: Mark [name], male    DOB: 12/27/1945, 68 y.o.   MRN: [id number]  HPI Patient history of congestive heart failure along with severe COPD. He had been doing well until Saturday. After Saturday he developed a cough productive of yellow and white the sputum. He reports chest congestion and increasing shortness of breath. His weight is up 1 pound since his last office visit. He does have pitting edema in both lower extremities there is +1 distal to the knee. I do not appreciate any crackles in his lung bases. He did not appear to be fluid overloaded today on examination. Instead he is wheezing and rhonchorous breath sounds and prolonged expiratory phase. Past Medical History  Diagnosis Date  . GERD (gastroesophageal reflux disease)   . Hyperlipidemia   . H/O hiatal hernia   . Hypertension     Sees Dr. Lynnea Ferrier  . AAA (abdominal aortic aneurysm)   . COPD (chronic obstructive pulmonary disease)   . Diastolic heart failure     a.  Echo (08/13/2013): EF 55-60%, normal wall motion, mild MR, mild LAE  . Pneumonia   . Arthritis     "little in my hands" (09/08/2013)  . Chronic kidney disease (CKD), stage III (moderate)   . Atrial fibrillation     a. amiodarone Rx started 08/2013;  b. s/p TEE-DCCV 09/2013 => recurrent AFib => repeat DCCV => NSR;   c. Xarelto 15 QD due to CKD  . Hx of cardiovascular stress test     a. Myoview 09/2012:  no scar or ischemia   Current Outpatient Prescriptions on File Prior to Visit  Medication Sig Dispense Refill  . albuterol (PROVENTIL HFA;VENTOLIN HFA) 108 (90 BASE) MCG/ACT inhaler Inhale 2 puffs into the lungs every 6 (six) hours as needed for wheezing or shortness of breath.  1 Inhaler  2  . amiodarone (PACERONE) 200 MG tablet Take 1 tablet (200 mg total) by mouth daily.  30 tablet  11  . atorvastatin (LIPITOR) 40 MG tablet Take 1 tablet (40 mg total) by mouth daily.  30 tablet  5  . budesonide-formoterol (SYMBICORT)  160-4.5 MCG/ACT inhaler Inhale 2 puffs into the lungs 2 (two) times daily.      Marland Kitchen DILTIAZEM HCL CD 360 MG 24 hr capsule TAKE ONE CAPSULE EVERY DAY  30 capsule  2  . furosemide (LASIX) 40 MG tablet Take 40 mg by mouth daily.   30 tablet  6  . linagliptin (TRADJENTA) 5 MG TABS tablet Take 1 tablet (5 mg total) by mouth daily.  30 tablet  3  . metoprolol succinate (TOPROL-XL) 25 MG 24 hr tablet TAKE 3 TABLETS BY MOUTH EVERY DAY TAKE WITH OR IMMEDIATELY AFTER BREAKFAST  90 tablet  11  . omeprazole (PRILOSEC) 40 MG capsule TAKE ONE CAPSULE TWICE A DAY  60 capsule  6  . tiotropium (SPIRIVA HANDIHALER) 18 MCG inhalation capsule Place 1 capsule (18 mcg total) into inhaler and inhale daily.  30 capsule  5  . XARELTO 15 MG TABS tablet TAKE 1 TABLET BY MOUTH EVERY DAY WITH SUPPER  30 tablet  3   No current facility-administered medications on file prior to visit.   Allergies  Allergen Reactions  . Niaspan [Niacin Er] Anaphylaxis   History   Social History  . Marital Status: Married    Spouse Name: Nicole Cella    Number of Children: 4  . Years of Education: N/A   Occupational History  .  Works PT as a Education administratorpainter    Social History Main Topics  . Smoking status: Former Smoker -- 1.00 packs/day for 56 years    Types: Cigarettes  . Smokeless tobacco: Former NeurosurgeonUser    Types: Chew     Comment: 09/08/2013 "stopped smoking cigarettes ~ 2 months ago; aien't chew in probably 10 yrs"  . Alcohol Use: No  . Drug Use: No  . Sexual Activity: Not Currently   Other Topics Concern  . Not on file   Social History Narrative   Married.  Lives with wife.  Ambulates without assistance.      Review of Systems  All other systems reviewed and are negative.       Objective:   Physical Exam  Vitals reviewed. Neck: Neck supple. No JVD present.  Cardiovascular: Normal rate, regular rhythm and normal heart sounds.   Pulmonary/Chest: Effort normal. He has wheezes.  Abdominal: Soft. Bowel sounds are normal. He  exhibits no distension and no mass. There is no tenderness. There is no rebound and no guarding.  Musculoskeletal: He exhibits edema.  Lymphadenopathy:    He has no cervical adenopathy.          Assessment & Plan:  1. COPD exacerbation Based on his examination I feel that this is bronchitis/COPD exacerbation rather than fluid overload pulmonary edema. I'll start the patient on prednisone taper pack along with azithromycin. I'll also send the patient for chest x-ray to rule out occult pulmonary edema that I can not appreciate today on examination. If symptoms are not improving, I would consider aggressive diuresis as well. Recheck in 24 hours or sooner if worse. - predniSONE (DELTASONE) 20 MG tablet; 3 tabs poqday 1-2, 2 tabs poqday 3-4, 1 tab poqday 5-6  Dispense: 12 tablet; Refill: 0 - azithromycin (ZITHROMAX) 250 MG tablet; 2 tabs poqday1, 1 tab poqday 2-5  Dispense: 6 tablet; Refill: 0 - DG Chest 2 View; Future

## 2013-12-23 ENCOUNTER — Ambulatory Visit
Admission: RE | Admit: 2013-12-23 | Discharge: 2013-12-23 | Disposition: A | Discharge status: Home / Self Care | Payer: Medicare Other | Source: Ambulatory Visit | Attending: Family Medicine | Admitting: Family Medicine

## 2013-12-23 DIAGNOSIS — J441 Chronic obstructive pulmonary disease with (acute) exacerbation: Secondary | ICD-10-CM

## 2013-12-24 NOTE — Telephone Encounter (Signed)
Can we switch him to Kenyaaztia XT 360 mg poqday, same med but appears cheaper on uptodate.

## 2013-12-25 ENCOUNTER — Other Ambulatory Visit: Payer: Self-pay | Admitting: Family Medicine

## 2013-12-25 MED ORDER — DILTIAZEM HCL ER BEADS 360 MG PO CP24
360.0000 mg | ORAL_CAPSULE | Freq: Every day | ORAL | Status: DC
Start: 2013-12-25 — End: 2014-01-21

## 2013-12-28 ENCOUNTER — Ambulatory Visit (INDEPENDENT_AMBULATORY_CARE_PROVIDER_SITE_OTHER): Payer: Medicare Other | Admitting: Family Medicine

## 2013-12-28 ENCOUNTER — Encounter: Payer: Self-pay | Admitting: Family Medicine

## 2013-12-28 ENCOUNTER — Encounter: Payer: Self-pay | Admitting: Family

## 2013-12-28 VITALS — BP 156/90 | HR 78 | Temp 97.0°F | Resp 18 | Ht 66.5 in | Wt 146.0 lb

## 2013-12-28 DIAGNOSIS — N183 Chronic kidney disease, stage 3 unspecified: Secondary | ICD-10-CM

## 2013-12-28 DIAGNOSIS — J441 Chronic obstructive pulmonary disease with (acute) exacerbation: Secondary | ICD-10-CM

## 2013-12-28 LAB — BASIC METABOLIC PANEL
BUN: 41 mg/dL — AB (ref 6–23)
CALCIUM: 9.2 mg/dL (ref 8.4–10.5)
CO2: 31 mEq/L (ref 19–32)
Chloride: 101 mEq/L (ref 96–112)
Creat: 2.28 mg/dL — ABNORMAL HIGH (ref 0.50–1.35)
Glucose, Bld: 120 mg/dL — ABNORMAL HIGH (ref 70–99)
Potassium: 4.9 mEq/L (ref 3.5–5.3)
SODIUM: 144 meq/L (ref 135–145)

## 2013-12-28 NOTE — Progress Notes (Signed)
Subjective:    Patient ID: Mark [name], male    DOB: 1946-06-05, 68 y.o.   MRN: [id number]  HPI 12/22/13 Patient history of congestive heart failure along with severe COPD. He had been doing well until Saturday. After Saturday he developed a cough productive of yellow and white the sputum. He reports chest congestion and increasing shortness of breath. His weight is up 1 pound since his last office visit. He does have pitting edema in both lower extremities there is +1 distal to the knee. I do not appreciate any crackles in his lung bases. He did not appear to be fluid overloaded today on examination. Instead he is wheezing and rhonchorous breath sounds and prolonged expiratory phase.  The plan was: 1. COPD exacerbation Based on his examination I feel that this is bronchitis/COPD exacerbation rather than fluid overload pulmonary edema. I'll start the patient on prednisone taper pack along with azithromycin. I'll also send the patient for chest x-ray to rule out occult pulmonary edema that I can not appreciate today on examination. If symptoms are not improving, I would consider aggressive diuresis as well. Recheck in 24 hours or sooner if worse. - predniSONE (DELTASONE) 20 MG tablet; 3 tabs poqday 1-2, 2 tabs poqday 3-4, 1 tab poqday 5-6  Dispense: 12 tablet; Refill: 0 - azithromycin (ZITHROMAX) 250 MG tablet; 2 tabs poqday1, 1 tab poqday 2-5  Dispense: 6 tablet; Refill: 0 - DG Chest 2 View; Future  12/28/13 Patient is here for follow up.  His breathing is much better.  Oxygen sats are up to 98%.  He denies any further sob, doe.  He denies CP.  STill has +1 edema in both legs.  CXR was clear.  Past Medical History  Diagnosis Date  . GERD (gastroesophageal reflux disease)   . Hyperlipidemia   . H/O hiatal hernia   . Hypertension     Sees Dr. Lynnea FerrierWarren Kenzie Thoreson  . AAA (abdominal aortic aneurysm)   . COPD (chronic obstructive pulmonary disease)   . Diastolic heart failure     a.  Echo  (08/13/2013): EF 55-60%, normal wall motion, mild MR, mild LAE  . Pneumonia   . Arthritis     "little in my hands" (09/08/2013)  . Chronic kidney disease (CKD), stage III (moderate)   . Atrial fibrillation     a. amiodarone Rx started 08/2013;  b. s/p TEE-DCCV 09/2013 => recurrent AFib => repeat DCCV => NSR;   c. Xarelto 15 QD due to CKD  . Hx of cardiovascular stress test     a. Myoview 09/2012:  no scar or ischemia   Current Outpatient Prescriptions on File Prior to Visit  Medication Sig Dispense Refill  . albuterol (PROVENTIL HFA;VENTOLIN HFA) 108 (90 BASE) MCG/ACT inhaler Inhale 2 puffs into the lungs every 6 (six) hours as needed for wheezing or shortness of breath.  1 Inhaler  2  . amiodarone (PACERONE) 200 MG tablet Take 1 tablet (200 mg total) by mouth daily.  30 tablet  11  . atorvastatin (LIPITOR) 40 MG tablet Take 1 tablet (40 mg total) by mouth daily.  30 tablet  5  . azithromycin (ZITHROMAX) 250 MG tablet 2 tabs poqday1, 1 tab poqday 2-5  6 tablet  0  . budesonide-formoterol (SYMBICORT) 160-4.5 MCG/ACT inhaler Inhale 2 puffs into the lungs 2 (two) times daily.      Marland Kitchen. diltiazem (TAZTIA XT) 360 MG 24 hr capsule Take 1 capsule (360 mg total) by mouth daily.  30 capsule  5  . DILTIAZEM HCL CD 360 MG 24 hr capsule TAKE ONE CAPSULE EVERY DAY  30 capsule  2  . furosemide (LASIX) 40 MG tablet Take 40 mg by mouth daily.   30 tablet  6  . linagliptin (TRADJENTA) 5 MG TABS tablet Take 1 tablet (5 mg total) by mouth daily.  30 tablet  3  . metoprolol succinate (TOPROL-XL) 25 MG 24 hr tablet TAKE 3 TABLETS BY MOUTH EVERY DAY TAKE WITH OR IMMEDIATELY AFTER BREAKFAST  90 tablet  11  . omeprazole (PRILOSEC) 40 MG capsule TAKE ONE CAPSULE TWICE A DAY  60 capsule  6  . predniSONE (DELTASONE) 20 MG tablet 3 tabs poqday 1-2, 2 tabs poqday 3-4, 1 tab poqday 5-6  12 tablet  0  . tiotropium (SPIRIVA HANDIHALER) 18 MCG inhalation capsule Place 1 capsule (18 mcg total) into inhaler and inhale daily.  30  capsule  5  . XARELTO 15 MG TABS tablet TAKE 1 TABLET BY MOUTH EVERY DAY WITH SUPPER  30 tablet  3   No current facility-administered medications on file prior to visit.   Allergies  Allergen Reactions  . Niaspan [Niacin Er] Anaphylaxis   History   Social History  . Marital Status: Married    Spouse Name: [name]    Number of Children: 4  . Years of Education: N/A   Occupational History  . Works PT as a Education administratorpainter    Social History Main Topics  . Smoking status: Former Smoker -- 1.00 packs/day for 56 years    Types: Cigarettes  . Smokeless tobacco: Former NeurosurgeonUser    Types: Chew     Comment: 09/08/2013 "stopped smoking cigarettes ~ 2 months ago; aien't chew in probably 10 yrs"  . Alcohol Use: No  . Drug Use: No  . Sexual Activity: Not Currently   Other Topics Concern  . Not on file   Social History Narrative   Married.  Lives with wife.  Ambulates without assistance.      Review of Systems  All other systems reviewed and are negative.      Objective:   Physical Exam  Vitals reviewed. Neck: Neck supple. No JVD present.  Cardiovascular: Normal rate, regular rhythm and normal heart sounds.   Pulmonary/Chest: Effort normal.  Abdominal: Soft. Bowel sounds are normal. He exhibits no distension and no mass. There is no tenderness. There is no rebound and no guarding.  Musculoskeletal: He exhibits edema.  Lymphadenopathy:    He has no cervical adenopathy.          Assessment & Plan:  CKD (chronic kidney disease) stage 3, GFR 30-59 ml/min - Plan: Basic Metabolic Panel  COPD exacerbation  Breathing is now back to baseline.  Continue spiriva and symbicort indefinitely.  Continue lasix 40 mg poqday indefinitely.  Recheck BMP today.  Take extra 40 mg of lasix on days if he gains 3 or more pounds.

## 2013-12-28 NOTE — Telephone Encounter (Signed)
Med sent to pharm 12/25/13

## 2013-12-29 ENCOUNTER — Ambulatory Visit (INDEPENDENT_AMBULATORY_CARE_PROVIDER_SITE_OTHER): Payer: Medicare Other | Admitting: Family

## 2013-12-29 ENCOUNTER — Encounter: Payer: Self-pay | Admitting: Family

## 2013-12-29 ENCOUNTER — Ambulatory Visit (HOSPITAL_COMMUNITY)
Admission: RE | Admit: 2013-12-29 | Discharge: 2013-12-29 | Disposition: A | Discharge status: Home / Self Care | Payer: Medicare Other | Source: Ambulatory Visit | Attending: Family | Admitting: Family

## 2013-12-29 ENCOUNTER — Encounter (HOSPITAL_COMMUNITY): Payer: Medicare Other

## 2013-12-29 ENCOUNTER — Ambulatory Visit: Payer: Medicare Other | Admitting: Vascular Surgery

## 2013-12-29 VITALS — BP 113/69 | HR 50 | Resp 14 | Ht 66.5 in | Wt 147.0 lb

## 2013-12-29 DIAGNOSIS — Z7189 Other specified counseling: Secondary | ICD-10-CM | POA: Insufficient documentation

## 2013-12-29 DIAGNOSIS — I714 Abdominal aortic aneurysm, without rupture, unspecified: Secondary | ICD-10-CM | POA: Insufficient documentation

## 2013-12-29 DIAGNOSIS — Z48812 Encounter for surgical aftercare following surgery on the circulatory system: Secondary | ICD-10-CM | POA: Insufficient documentation

## 2013-12-29 NOTE — Progress Notes (Signed)
[facility] [place]  Established Open AAA Repair  History of Present Illness  Mark [name] is a 68 y.o. (10/30/1945) male patient of Dr. 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  Most recent ABI (Date: 10/13/2012) demonstrates: 1.09 on the right and 0.91 on the left.  The patient has not had back or abdominal pain.  His left hip no longer hurts since resection and Grafting of Abdominal Aortobi-Iliac Aneurysm. [facility], [facility], also has COPD, lost 25 pounds. He feels well now, getting his strength back. He is taking Xaralto for atrial fib.  Pt Diabetic: Yes, well controlled Pt smoker: former smoker, quit November, 2014  Past Medical History  Diagnosis Date  . GERD (gastroesophageal reflux disease)   . Hyperlipidemia   . H/O hiatal hernia   . Hypertension     Sees Dr. Lynnea FerrierWarren Pickard  . AAA (abdominal aortic aneurysm)   . COPD (chronic obstructive pulmonary disease)   . Diastolic heart failure     a.  Echo (08/13/2013): EF 55-60%, normal wall motion, mild MR, mild LAE  . Pneumonia   . Arthritis     "little in my hands" (09/08/2013)  . Chronic kidney disease (CKD), stage III (moderate)   . Atrial fibrillation     a. amiodarone Rx started 08/2013;  b. s/p TEE-DCCV 09/2013 => recurrent AFib => repeat DCCV => NSR;   c. Xarelto 15 QD due to CKD  . Hx of cardiovascular stress test     a. Myoview 09/2012:  no scar or ischemia    Past Surgical History  Procedure Laterality Date  . Knee arthroscopy Right   . Abdominal aortic aneurysm repair  10/15/2012    Procedure: ANEURYSM ABDOMINAL AORTIC REPAIR;  Surgeon: Pryor OchoaJames D Lawson, MD;  [facility];  Service: Vascular;  Laterality: N/A;  Resection and Grafting of Abdominal Aortic Aneurysm - Aortobi-Iliac   . Tee without cardioversion N/A 09/22/2013    Procedure: TRANSESOPHAGEAL  ECHOCARDIOGRAM (TEE);  Surgeon: Lars MassonKatarina H Nelson, MD;  [facility];  Service: Cardiovascular;  Laterality: N/A;  . Cardioversion N/A 09/22/2013    Procedure: CARDIOVERSION;  Surgeon: Lars MassonKatarina H Nelson, MD;  [facility];  Service: Cardiovascular;  Laterality: N/A;   Social History History   Social History  . Marital Status: Married    Spouse Name: [name]    Number of Children: 4  . Years of Education: N/A   Occupational History  . Works PT as a Education administratorpainter    Social History Main Topics  . Smoking status: Former Smoker -- 1.00 packs/day for 56 years    Types: Cigarettes  . Smokeless tobacco: Former NeurosurgeonUser    Types: Chew     Comment: 09/08/2013 "stopped smoking cigarettes ~ 2 months ago; aien't chew in probably 10 yrs"  . Alcohol Use: No  . Drug Use: No  . Sexual Activity: Not Currently   Other Topics Concern  . Not on file   Social History Narrative   Married.  Lives with wife.  Ambulates without assistance.   Family History Family History  Problem Relation Age of Onset  . Diabetes Mother   . Heart disease Father   . Heart attack Father    Current Outpatient Prescriptions on File Prior to Visit  Medication Sig Dispense Refill  . albuterol (PROVENTIL HFA;VENTOLIN HFA) 108 (90 BASE) MCG/ACT inhaler Inhale 2 puffs  into the lungs every 6 (six) hours as needed for wheezing or shortness of breath.  1 Inhaler  2  . amiodarone (PACERONE) 200 MG tablet Take 1 tablet (200 mg total) by mouth daily.  30 tablet  11  . atorvastatin (LIPITOR) 40 MG tablet Take 1 tablet (40 mg total) by mouth daily.  30 tablet  5  . azithromycin (ZITHROMAX) 250 MG tablet 2 tabs poqday1, 1 tab poqday 2-5  6 tablet  0  . budesonide-formoterol (SYMBICORT) 160-4.5 MCG/ACT inhaler Inhale 2 puffs into the lungs 2 (two) times daily.      Marland Kitchen diltiazem (TAZTIA XT) 360 MG 24 hr capsule Take 1 capsule (360 mg total) by mouth daily.  30 capsule  5  . furosemide (LASIX) 40 MG tablet Take 40 mg by mouth  daily.   30 tablet  6  . linagliptin (TRADJENTA) 5 MG TABS tablet Take 1 tablet (5 mg total) by mouth daily.  30 tablet  3  . metoprolol succinate (TOPROL-XL) 25 MG 24 hr tablet TAKE 3 TABLETS BY MOUTH EVERY DAY TAKE WITH OR IMMEDIATELY AFTER BREAKFAST  90 tablet  11  . omeprazole (PRILOSEC) 40 MG capsule TAKE ONE CAPSULE TWICE A DAY  60 capsule  6  . tiotropium (SPIRIVA HANDIHALER) 18 MCG inhalation capsule Place 1 capsule (18 mcg total) into inhaler and inhale daily.  30 capsule  5  . XARELTO 15 MG TABS tablet TAKE 1 TABLET BY MOUTH EVERY DAY WITH SUPPER  30 tablet  3   No current facility-administered medications on file prior to visit.   Allergies  Allergen Reactions  . Niaspan [Niacin Er] Anaphylaxis    ROS: See HPI for pertinent positives and negatives.    Physical Examination  Filed Vitals:   12/29/13 1524  BP: 113/69  Pulse: 50  Resp: 14   Filed Weights   12/29/13 1524  Weight: 147 lb (66.679 kg)  Body mass index is 23.37 kg/(m^2).   General: A&O x 3, WD.  Pulmonary: Sym exp, good air movt, CTAB, no rales, rhonchi, or wheezing.   Cardiac: RRR, Nl S1, S2, no detected murmur.   Carotid Bruits Left Right   Negative Negative    Aorta is faintly palpable. Radial pulses are 2+ palpable and equal.                         VASCULAR EXAM: Extremities without ischemic changes  without Gangrene; without open wounds. Bilateral 1-2+ pitting edema in ankles.                                                                                                        LE Pulses LEFT RIGHT       FEMORAL   palpable   palpable        POPLITEAL  not palpable   not palpable       POSTERIOR TIBIAL   palpable    palpable        DORSALIS PEDIS      ANTERIOR TIBIAL  palpable  palpable      Gastrointestinal: soft, NTND, -G/R, - HSM, - masses, - CVAT B.  Musculoskeletal: M/S 5/5 throughout , Extremities without ischemic changes.  Neurologic: Pain and light touch intact in  extremities, Motor exam as listed above. Some hearing loss.  Non-Invasive Vascular Imaging  ABI (Date: 12/29/2013)  Right: PTA 1.15, DPA 1.07, TBI 1.12 with triphasic waveforms.  Left: PTA 1.15, DPA 1.18, TBI 1.28 with triphasic waveforms.   Medical Decision Making  Mark CarnesHorace Harl Bowiehomas [name] is a 68 y.o. male who presents s/p OAR.  Pt is asymptomatic with normal ABI's.  The next ABI will be scheduled for 2 years.  The patient will follow up with us in 24 months with these studies.  I discussed in depth with the patient the nature of atherosclerosis, and emphasized the importance of maximal medical management including strict control of blood pressure, blood glucose, and lipid levels, obtaining regular exercise, and continued cessation of smoking.    The patient is aware that without maximal medical management the underlying atherosclerotic disease process will progress, limiting the benefit of any interventions.   Thank you for allowing us to participate in this patient's care.  Charisse MarchSuzanne Nickel, RN, MSN, FNP-C [facility] [place] Office: [phone]   Clinic Physician: Hart RochesterLawson   12/29/2013, 2:59 PM

## 2013-12-29 NOTE — Patient Instructions (Signed)

## 2013-12-31 NOTE — Addendum Note (Signed)
Addended by: Adria DillELDRIDGE-LEWIS, Domenica Weightman L on: 12/31/2013 02:25 PM   Modules accepted: Orders

## 2014-01-21 ENCOUNTER — Encounter: Payer: Self-pay | Admitting: Family Medicine

## 2014-01-21 ENCOUNTER — Ambulatory Visit (INDEPENDENT_AMBULATORY_CARE_PROVIDER_SITE_OTHER): Payer: Medicare Other | Admitting: Family Medicine

## 2014-01-21 VITALS — BP 148/86 | HR 62 | Temp 97.7°F | Resp 18 | Ht 66.0 in | Wt 148.0 lb

## 2014-01-21 DIAGNOSIS — R339 Retention of urine, unspecified: Secondary | ICD-10-CM

## 2014-01-21 DIAGNOSIS — K5900 Constipation, unspecified: Secondary | ICD-10-CM

## 2014-01-21 NOTE — Progress Notes (Signed)
Subjective:    Patient ID: Mark [name], male    DOB: 1946-06-10, 68 y.o.   MRN: [id number]  HPI Patient complains of constipation has been going on for several weeks. He is very infrequent and very hard stool over the last 2 days his reported difficulty urinating. He states he feels like he needs to pee but nothing would come out.  He is still making urine he does feel like he is not completely emptying his bladder. He denies any hematuria or dysuria. He does have some mild low back pain. He denies any fever. Past Medical History  Diagnosis Date  . GERD (gastroesophageal reflux disease)   . Hyperlipidemia   . H/O hiatal hernia   . Hypertension     Sees Dr. Lynnea FerrierWarren Pickard  . AAA (abdominal aortic aneurysm)   . COPD (chronic obstructive pulmonary disease)   . Diastolic heart failure     a.  Echo (08/13/2013): EF 55-60%, normal wall motion, mild MR, mild LAE  . Pneumonia   . Arthritis     "little in my hands" (09/08/2013)  . Chronic kidney disease (CKD), stage III (moderate)   . Atrial fibrillation     a. amiodarone Rx started 08/2013;  b. s/p TEE-DCCV 09/2013 => recurrent AFib => repeat DCCV => NSR;   c. Xarelto 15 QD due to CKD  . Hx of cardiovascular stress test     a. Myoview 09/2012:  no scar or ischemia   Current Outpatient Prescriptions on File Prior to Visit  Medication Sig Dispense Refill  . albuterol (PROVENTIL HFA;VENTOLIN HFA) 108 (90 BASE) MCG/ACT inhaler Inhale 2 puffs into the lungs every 6 (six) hours as needed for wheezing or shortness of breath.  1 Inhaler  2  . amiodarone (PACERONE) 200 MG tablet Take 1 tablet (200 mg total) by mouth daily.  30 tablet  11  . atorvastatin (LIPITOR) 40 MG tablet Take 1 tablet (40 mg total) by mouth daily.  30 tablet  5  . budesonide-formoterol (SYMBICORT) 160-4.5 MCG/ACT inhaler Inhale 2 puffs into the lungs 2 (two) times daily.      Marland Kitchen. diltiazem (CARDIZEM CD) 360 MG 24 hr capsule       . furosemide (LASIX) 40 MG tablet Take 40  mg by mouth daily.   30 tablet  6  . linagliptin (TRADJENTA) 5 MG TABS tablet Take 1 tablet (5 mg total) by mouth daily.  30 tablet  3  . metoprolol succinate (TOPROL-XL) 25 MG 24 hr tablet TAKE 3 TABLETS BY MOUTH EVERY DAY TAKE WITH OR IMMEDIATELY AFTER BREAKFAST  90 tablet  11  . tiotropium (SPIRIVA HANDIHALER) 18 MCG inhalation capsule Place 1 capsule (18 mcg total) into inhaler and inhale daily.  30 capsule  5  . XARELTO 15 MG TABS tablet TAKE 1 TABLET BY MOUTH EVERY DAY WITH SUPPER  30 tablet  3  . omeprazole (PRILOSEC) 40 MG capsule TAKE ONE CAPSULE TWICE A DAY  60 capsule  6   No current facility-administered medications on file prior to visit.   Past Surgical History  Procedure Laterality Date  . Knee arthroscopy Right   . Abdominal aortic aneurysm repair  10/15/2012    Procedure: ANEURYSM ABDOMINAL AORTIC REPAIR;  Surgeon: Pryor OchoaJames D Lawson, MD;  [facility];  Service: Vascular;  Laterality: N/A;  Resection and Grafting of Abdominal Aortic Aneurysm - Aortobi-Iliac   . Tee without cardioversion N/A 09/22/2013    Procedure: TRANSESOPHAGEAL ECHOCARDIOGRAM (TEE);  Surgeon: Faustino CongressKatarina H  Delton SeeNelson, MD;  [facility];  Service: Cardiovascular;  Laterality: N/A;  . Cardioversion N/A 09/22/2013    Procedure: CARDIOVERSION;  Surgeon: Lars MassonKatarina H Nelson, MD;  [facility];  Service: Cardiovascular;  Laterality: N/A;  . Abdominal aortic aneurysm repair  10-15-2012  . Cardioversion  Jan. 12, 2015    Dr. Doylene CanningGregg W. Ladona Ridgelaylor   Allergies  Allergen Reactions  . Niaspan [Niacin Er] Anaphylaxis   History   Social History  . Marital Status: Married    Spouse Name: [name]    Number of Children: 4  . Years of Education: N/A   Occupational History  . Works PT as a Education administratorpainter    Social History Main Topics  . Smoking status: Former Smoker -- 1.00 packs/day for 56 years    Types: Cigarettes    Quit date: 07/31/2013  . Smokeless tobacco: Former NeurosurgeonUser    Types: Chew     Comment: 09/08/2013  "stopped smoking cigarettes ~ 2 months ago; aien't chew in probably 10 yrs"  . Alcohol Use: No  . Drug Use: No  . Sexual Activity: Not Currently   Other Topics Concern  . Not on file   Social History Narrative   Married.  Lives with wife.  Ambulates without assistance.      Review of Systems  All other systems reviewed and are negative.      Objective:   Physical Exam  Vitals reviewed. Constitutional: He appears well-developed and well-nourished.  Cardiovascular: Normal rate, regular rhythm and normal heart sounds.   Pulmonary/Chest: Effort normal and breath sounds normal.  Abdominal: Soft. Bowel sounds are normal. He exhibits no distension. There is no tenderness. There is no rebound.          Assessment & Plan:  Unspecified constipation  Urinary retention with incomplete bladder emptying  patient is definitely constipated. I am concerned constipation may be causing his urinary retention. The other possibility would be prostatitis. I will try to treat his constipation first. If his incomplete bladder emptying continues I would then treat patient for prostatitis.  Start linzess 290 mcg poqday and fleets enema prn x 1 and Recheck if no better by Monday.

## 2014-01-28 ENCOUNTER — Ambulatory Visit: Payer: Self-pay | Admitting: Family Medicine

## 2014-02-02 ENCOUNTER — Telehealth: Payer: Self-pay | Admitting: Family Medicine

## 2014-02-02 NOTE — Telephone Encounter (Signed)
Message copied by Ricard DillonWILLIS, SANDY B on Tue Feb 02, 2014  4:18 PM ------      Message from: Malvin JohnsBULLINS, SUSAN S      Created: Tue Feb 02, 2014 10:51 AM       Patient would like to talk to you about some medication       [phone] ------

## 2014-02-02 NOTE — Telephone Encounter (Signed)
Spoke with pt and he would like to know if you want him to continue taking the [place] as he is only taking it every other day and feels fine. Also only using the Symbicort qd as well and wants to know if he should be doing this bid?? (Needs sample of Xeralto - (FYI for me)

## 2014-02-04 NOTE — Telephone Encounter (Signed)
Call placed to patient and patient made aware.   States that he will stop Spiriva for now and increase Symbicort to BID. Patient will F/u as needed if breathing worsens.

## 2014-02-04 NOTE — Telephone Encounter (Signed)
symbicort needs to be bid.  When we lasts topped spiriva, his breathing worsened.  He can certainly try stopping spiriva but needs to tell me asap if his breathing worsens.

## 2014-02-18 ENCOUNTER — Telehealth: Payer: Self-pay | Admitting: Family Medicine

## 2014-02-22 ENCOUNTER — Other Ambulatory Visit: Payer: Self-pay | Admitting: Family Medicine

## 2014-02-24 ENCOUNTER — Other Ambulatory Visit: Payer: Self-pay | Admitting: Family Medicine

## 2014-02-24 MED ORDER — TIOTROPIUM BROMIDE MONOHYDRATE 18 MCG IN CAPS: 18.0000 ug | ORAL_CAPSULE | Freq: Every day | RESPIRATORY_TRACT | Status: DC

## 2014-02-24 MED ORDER — RIVAROXABAN 15 MG PO TABS: ORAL_TABLET | ORAL | Status: DC

## 2014-02-24 MED ORDER — BUDESONIDE-FORMOTEROL FUMARATE 160-4.5 MCG/ACT IN AERO
2.0000 | INHALATION_SPRAY | Freq: Two times a day (BID) | RESPIRATORY_TRACT | Status: DC
Start: 2014-02-24 — End: 2015-02-15

## 2014-02-24 MED ORDER — LINAGLIPTIN 5 MG PO TABS: 5.0000 mg | ORAL_TABLET | Freq: Every day | ORAL | Status: DC

## 2014-02-24 NOTE — Telephone Encounter (Signed)
Rx's printed left on provider desk to sign and to be faxed

## 2014-02-25 ENCOUNTER — Ambulatory Visit: Payer: Medicare Other | Admitting: Cardiology

## 2014-04-01 ENCOUNTER — Ambulatory Visit (INDEPENDENT_AMBULATORY_CARE_PROVIDER_SITE_OTHER): Payer: Medicare Other | Admitting: Family Medicine

## 2014-04-01 ENCOUNTER — Encounter: Payer: Self-pay | Admitting: Family Medicine

## 2014-04-01 VITALS — BP 142/70 | HR 74 | Temp 97.1°F | Resp 20 | Ht 66.5 in | Wt 144.0 lb

## 2014-04-01 DIAGNOSIS — R0989 Other specified symptoms and signs involving the circulatory and respiratory systems: Secondary | ICD-10-CM

## 2014-04-01 DIAGNOSIS — R0600 Dyspnea, unspecified: Secondary | ICD-10-CM

## 2014-04-01 DIAGNOSIS — R0609 Other forms of dyspnea: Secondary | ICD-10-CM

## 2014-04-01 MED ORDER — PREDNISONE 20 MG PO TABS: ORAL_TABLET | ORAL | Status: DC

## 2014-04-01 NOTE — Progress Notes (Signed)
Subjective:    Patient ID: Mark [name], male    DOB: 1946/09/10, 68 y.o.   MRN: [id number]  HPI Patient presents with one-week of dyspnea on exertion. He has been using his Symbicort and spiriva daily as directed. He is also been using his albuterol at lunch. The albuterol does seem to help his breathing temporarily. He does report a cough for the morning productive of clear mucus. Otherwise he denies fever or coughing. He denies any chest pain or chest pressure. His weight is at his dry weight. There is no pitting edema in his legs. There is no evidence of fluid overload. He denies orthopnea or paroxysmal nocturnal dyspnea. He does report dyspnea on exertion. Examination today is significant for decreased breath sounds, prolonged toes. There are no wheezes on examination. In the past he has had respiratory failure due to combination of diastolic dysfunction, pulmonary edema, and COPD.   Past Medical History  Diagnosis Date  . GERD (gastroesophageal reflux disease)   . Hyperlipidemia   . H/O hiatal hernia   . Hypertension     Sees Dr. Lynnea FerrierWarren Pickard  . AAA (abdominal aortic aneurysm)   . COPD (chronic obstructive pulmonary disease)   . Diastolic heart failure     a.  Echo (08/13/2013): EF 55-60%, normal wall motion, mild MR, mild LAE  . Pneumonia   . Arthritis     "little in my hands" (09/08/2013)  . Chronic kidney disease (CKD), stage III (moderate)   . Atrial fibrillation     a. amiodarone Rx started 08/2013;  b. s/p TEE-DCCV 09/2013 => recurrent AFib => repeat DCCV => NSR;   c. Xarelto 15 QD due to CKD  . Hx of cardiovascular stress test     a. Myoview 09/2012:  no scar or ischemia   Past Surgical History  Procedure Laterality Date  . Knee arthroscopy Right   . Abdominal aortic aneurysm repair  10/15/2012    Procedure: ANEURYSM ABDOMINAL AORTIC REPAIR;  Surgeon: Pryor OchoaJames D Lawson, MD;  [facility];  Service: Vascular;  Laterality: N/A;  Resection and Grafting of Abdominal  Aortic Aneurysm - Aortobi-Iliac   . Tee without cardioversion N/A 09/22/2013    Procedure: TRANSESOPHAGEAL ECHOCARDIOGRAM (TEE);  Surgeon: Lars MassonKatarina H Nelson, MD;  [facility];  Service: Cardiovascular;  Laterality: N/A;  . Cardioversion N/A 09/22/2013    Procedure: CARDIOVERSION;  Surgeon: Lars MassonKatarina H Nelson, MD;  [facility];  Service: Cardiovascular;  Laterality: N/A;  . Abdominal aortic aneurysm repair  10-15-2012  . Cardioversion  Jan. 12, 2015    Dr. Doylene CanningGregg W. Ladona Ridgelaylor   Current Outpatient Prescriptions on File Prior to Visit  Medication Sig Dispense Refill  . albuterol (VENTOLIN HFA) 108 (90 BASE) MCG/ACT inhaler Inhale 2 puffs into the lungs every 6 (six) hours as needed for wheezing or shortness of breath.      Marland Kitchen. amiodarone (PACERONE) 200 MG tablet Take 1 tablet (200 mg total) by mouth daily.  30 tablet  11  . budesonide-formoterol (SYMBICORT) 160-4.5 MCG/ACT inhaler Inhale 2 puffs into the lungs 2 (two) times daily.  1 Inhaler  11  . diltiazem (CARDIZEM CD) 360 MG 24 hr capsule       . esomeprazole (NEXIUM) 40 MG capsule Take 40 mg by mouth daily at 12 noon.      . furosemide (LASIX) 40 MG tablet TAKE 1 TABLET (40 MG TOTAL) BY MOUTH DAILY.  30 tablet  5  . linagliptin (TRADJENTA) 5 MG TABS tablet  Take 1 tablet (5 mg total) by mouth daily.  30 tablet  11  . metoprolol succinate (TOPROL-XL) 25 MG 24 hr tablet TAKE 3 TABLETS BY MOUTH EVERY DAY TAKE WITH OR IMMEDIATELY AFTER BREAKFAST  90 tablet  11  . ranitidine (ZANTAC) 150 MG capsule Take 150 mg by mouth 2 (two) times daily.      . Rivaroxaban (XARELTO) 15 MG TABS tablet TAKE 1 TABLET BY MOUTH EVERY DAY WITH SUPPER  30 tablet  11  . rosuvastatin (CRESTOR) 20 MG tablet Take 20 mg by mouth daily.      Marland Kitchen tiotropium (SPIRIVA HANDIHALER) 18 MCG inhalation capsule Place 1 capsule (18 mcg total) into inhaler and inhale daily.  30 capsule  11   No current facility-administered medications on file prior to visit.   Allergies    Allergen Reactions  . Niaspan [Niacin Er] Anaphylaxis   History   Social History  . Marital Status: Married    Spouse Name: Nicole Cella    Number of Children: 4  . Years of Education: N/A   Occupational History  . Works PT as a Education administrator    Social History Main Topics  . Smoking status: Former Smoker -- 1.00 packs/day for 56 years    Types: Cigarettes    Quit date: 07/31/2013  . Smokeless tobacco: Former Neurosurgeon    Types: Chew     Comment: 09/08/2013 "stopped smoking cigarettes ~ 2 months ago; aien't chew in probably 10 yrs"  . Alcohol Use: No  . Drug Use: No  . Sexual Activity: Not Currently   Other Topics Concern  . Not on file   Social History Narrative   Married.  Lives with wife.  Ambulates without assistance.      Review of Systems  All other systems reviewed and are negative.      Objective:   Physical Exam  Vitals reviewed. Constitutional: He appears well-developed and well-nourished. No distress.  Neck: Neck supple. No JVD present.  Cardiovascular: Normal rate, regular rhythm and normal heart sounds.   Pulmonary/Chest: Effort normal. No respiratory distress. He has decreased breath sounds. He has no wheezes. He has no rales.  Abdominal: Soft. Bowel sounds are normal.  Musculoskeletal: He exhibits no edema.  Lymphadenopathy:    He has no cervical adenopathy.  Skin: He is not diaphoretic.          Assessment & Plan:  1. Dyspnea  There is no evidence of fluid overload today on his examination. I will obtain a chest x-ray to rule out pulmonary edema and pneumonia as a cause of his dyspnea on exertion. However I believe is more likely this is secondary to his COPD despite maximum therapy with a long-acting bronchodilator as well as a long-acting anti-cholinergic. EKG obtained today in the office reveals normal sinus rhythm at 61 beats per minute with normal and there is a left axis deviation. There is no significant change in his EKG from EKG in March.  That  probably the patient's shortness of breath is due to his COPD. I want him to increase albuterol to 2 puffs inhaled every 6 hours as needed. I also put him on a temporary prednisone Dosepak. At the present time I see no indication for antibiotics. If his cough becomes productive of purulent sputum I will add Levaquin.  Recheck next week or immediately if worse - EKG 12-Lead - DG Chest 2 View; Future

## 2014-04-01 NOTE — Addendum Note (Signed)
Addended by: Lynnea FerrierPICKARD, Marjorie Deprey on: 04/01/2014 02:22 PM   Modules accepted: Orders

## 2014-04-02 LAB — BASIC METABOLIC PANEL WITH GFR
BUN: 30 mg/dL — AB (ref 6–23)
CHLORIDE: 99 meq/L (ref 96–112)
CO2: 26 mEq/L (ref 19–32)
CREATININE: 2.75 mg/dL — AB (ref 0.50–1.35)
Calcium: 8.7 mg/dL (ref 8.4–10.5)
GFR, Est African American: 26 mL/min — ABNORMAL LOW
GFR, Est Non African American: 23 mL/min — ABNORMAL LOW
GLUCOSE: 113 mg/dL — AB (ref 70–99)
POTASSIUM: 4.6 meq/L (ref 3.5–5.3)
Sodium: 137 mEq/L (ref 135–145)

## 2014-04-02 LAB — CBC WITH DIFFERENTIAL/PLATELET
Basophils Absolute: 0 10*3/uL (ref 0.0–0.1)
Basophils Relative: 0 % (ref 0–1)
EOS PCT: 3 % (ref 0–5)
Eosinophils Absolute: 0.2 10*3/uL (ref 0.0–0.7)
HCT: 33 % — ABNORMAL LOW (ref 39.0–52.0)
HEMOGLOBIN: 10.8 g/dL — AB (ref 13.0–17.0)
LYMPHS PCT: 15 % (ref 12–46)
Lymphs Abs: 1.2 10*3/uL (ref 0.7–4.0)
MCH: 27.2 pg (ref 26.0–34.0)
MCHC: 32.7 g/dL (ref 30.0–36.0)
MCV: 83.1 fL (ref 78.0–100.0)
MONO ABS: 1 10*3/uL (ref 0.1–1.0)
MONOS PCT: 13 % — AB (ref 3–12)
NEUTROS ABS: 5.5 10*3/uL (ref 1.7–7.7)
Neutrophils Relative %: 69 % (ref 43–77)
Platelets: 269 10*3/uL (ref 150–400)
RBC: 3.97 MIL/uL — ABNORMAL LOW (ref 4.22–5.81)
RDW: 19.4 % — ABNORMAL HIGH (ref 11.5–15.5)
WBC: 7.9 10*3/uL (ref 4.0–10.5)

## 2014-04-13 ENCOUNTER — Telehealth: Payer: Self-pay | Admitting: *Deleted

## 2014-04-13 NOTE — Telephone Encounter (Signed)
Patient needs to bee seen tomorrow.  Likely needs prednisone.

## 2014-04-13 NOTE — Telephone Encounter (Signed)
Pt called wanting to speak to you in reference to his O2 levels, and breathing. Please call ASAP!  [phone]

## 2014-04-13 NOTE — Telephone Encounter (Signed)
Appt. Scheduled.

## 2014-04-13 NOTE — Telephone Encounter (Signed)
Pt states that his 02 sats have been dropping this week. 60's & 70's. Pt has been working in a hot attic as well. He did take 2 fluid pills this am and his o2 sat was 81% while on phone with me. He has had some increase SOB and pain in right chest on and off. Is there anything else he needs to do?

## 2014-04-14 ENCOUNTER — Inpatient Hospital Stay (HOSPITAL_COMMUNITY)
Admission: EM | Admit: 2014-04-14 | Discharge: 2014-04-16 | DRG: 193 | Disposition: A | Discharge status: Home / Self Care | Payer: Medicare Other | Attending: Internal Medicine | Admitting: Internal Medicine

## 2014-04-14 ENCOUNTER — Encounter (HOSPITAL_COMMUNITY): Payer: Self-pay | Admitting: Emergency Medicine

## 2014-04-14 ENCOUNTER — Encounter: Payer: Self-pay | Admitting: Family Medicine

## 2014-04-14 ENCOUNTER — Ambulatory Visit (INDEPENDENT_AMBULATORY_CARE_PROVIDER_SITE_OTHER): Payer: Medicare Other | Admitting: Family Medicine

## 2014-04-14 ENCOUNTER — Emergency Department (HOSPITAL_COMMUNITY): Payer: Medicare Other

## 2014-04-14 VITALS — BP 144/72 | HR 77 | Temp 98.3°F | Resp 24 | Ht 65.0 in | Wt 145.0 lb

## 2014-04-14 DIAGNOSIS — M19049 Primary osteoarthritis, unspecified hand: Secondary | ICD-10-CM | POA: Diagnosis present

## 2014-04-14 DIAGNOSIS — Z7901 Long term (current) use of anticoagulants: Secondary | ICD-10-CM

## 2014-04-14 DIAGNOSIS — J441 Chronic obstructive pulmonary disease with (acute) exacerbation: Secondary | ICD-10-CM

## 2014-04-14 DIAGNOSIS — I509 Heart failure, unspecified: Secondary | ICD-10-CM | POA: Diagnosis present

## 2014-04-14 DIAGNOSIS — K219 Gastro-esophageal reflux disease without esophagitis: Secondary | ICD-10-CM | POA: Diagnosis present

## 2014-04-14 DIAGNOSIS — R0902 Hypoxemia: Secondary | ICD-10-CM

## 2014-04-14 DIAGNOSIS — J9600 Acute respiratory failure, unspecified whether with hypoxia or hypercapnia: Secondary | ICD-10-CM | POA: Diagnosis present

## 2014-04-14 DIAGNOSIS — N184 Chronic kidney disease, stage 4 (severe): Secondary | ICD-10-CM | POA: Diagnosis present

## 2014-04-14 DIAGNOSIS — J449 Chronic obstructive pulmonary disease, unspecified: Secondary | ICD-10-CM | POA: Diagnosis present

## 2014-04-14 DIAGNOSIS — N183 Chronic kidney disease, stage 3 unspecified: Secondary | ICD-10-CM

## 2014-04-14 DIAGNOSIS — I5032 Chronic diastolic (congestive) heart failure: Secondary | ICD-10-CM

## 2014-04-14 DIAGNOSIS — I129 Hypertensive chronic kidney disease with stage 1 through stage 4 chronic kidney disease, or unspecified chronic kidney disease: Secondary | ICD-10-CM | POA: Diagnosis present

## 2014-04-14 DIAGNOSIS — Z87891 Personal history of nicotine dependence: Secondary | ICD-10-CM

## 2014-04-14 DIAGNOSIS — I10 Essential (primary) hypertension: Secondary | ICD-10-CM

## 2014-04-14 DIAGNOSIS — J189 Pneumonia, unspecified organism: Secondary | ICD-10-CM | POA: Diagnosis present

## 2014-04-14 DIAGNOSIS — I5031 Acute diastolic (congestive) heart failure: Secondary | ICD-10-CM

## 2014-04-14 DIAGNOSIS — J9601 Acute respiratory failure with hypoxia: Secondary | ICD-10-CM

## 2014-04-14 DIAGNOSIS — I4891 Unspecified atrial fibrillation: Secondary | ICD-10-CM | POA: Diagnosis present

## 2014-04-14 DIAGNOSIS — J9620 Acute and chronic respiratory failure, unspecified whether with hypoxia or hypercapnia: Secondary | ICD-10-CM

## 2014-04-14 DIAGNOSIS — J9621 Acute and chronic respiratory failure with hypoxia: Secondary | ICD-10-CM

## 2014-04-14 DIAGNOSIS — I5033 Acute on chronic diastolic (congestive) heart failure: Secondary | ICD-10-CM | POA: Diagnosis present

## 2014-04-14 DIAGNOSIS — E785 Hyperlipidemia, unspecified: Secondary | ICD-10-CM | POA: Diagnosis present

## 2014-04-14 DIAGNOSIS — Z66 Do not resuscitate: Secondary | ICD-10-CM | POA: Diagnosis present

## 2014-04-14 DIAGNOSIS — J4489 Other specified chronic obstructive pulmonary disease: Secondary | ICD-10-CM | POA: Diagnosis present

## 2014-04-14 LAB — TROPONIN I: Troponin I: <0.3 ng/mL (ref <0.30)

## 2014-04-14 LAB — I-STAT ARTERIAL BLOOD GAS, ED
ACID-BASE EXCESS: 2 mmol/L (ref 0.0–2.0)
Bicarbonate: 25.9 mEq/L — ABNORMAL HIGH (ref 20.0–24.0)
O2 Saturation: 95 %
PH ART: 7.475 — AB (ref 7.350–7.450)
TCO2: 27 mmol/L (ref 0–100)
pCO2 arterial: 35.1 mmHg (ref 35.0–45.0)
pO2, Arterial: 68 mmHg — ABNORMAL LOW (ref 80.0–100.0)

## 2014-04-14 LAB — CBC
HCT: 32.4 % — ABNORMAL LOW (ref 39.0–52.0)
Hemoglobin: 10.4 g/dL — ABNORMAL LOW (ref 13.0–17.0)
MCH: 27.3 pg (ref 26.0–34.0)
MCHC: 32.1 g/dL (ref 30.0–36.0)
MCV: 85 fL (ref 78.0–100.0)
PLATELETS: 298 10*3/uL (ref 150–400)
RBC: 3.81 MIL/uL — AB (ref 4.22–5.81)
RDW: 18.3 % — AB (ref 11.5–15.5)
WBC: 11.1 10*3/uL — AB (ref 4.0–10.5)

## 2014-04-14 LAB — URINALYSIS, ROUTINE W REFLEX MICROSCOPIC
Bilirubin Urine: NEGATIVE
Glucose, UA: NEGATIVE mg/dL
HGB URINE DIPSTICK: NEGATIVE
Ketones, ur: NEGATIVE mg/dL
LEUKOCYTES UA: NEGATIVE
Nitrite: NEGATIVE
PROTEIN: 30 mg/dL — AB
SPECIFIC GRAVITY, URINE: 1.015 (ref 1.005–1.030)
UROBILINOGEN UA: 1 mg/dL (ref 0.0–1.0)
pH: 5.5 (ref 5.0–8.0)

## 2014-04-14 LAB — BASIC METABOLIC PANEL
Anion gap: 17 — ABNORMAL HIGH (ref 5–15)
BUN: 34 mg/dL — ABNORMAL HIGH (ref 6–23)
CHLORIDE: 94 meq/L — AB (ref 96–112)
CO2: 26 mEq/L (ref 19–32)
Calcium: 9.3 mg/dL (ref 8.4–10.5)
Creatinine, Ser: 2.87 mg/dL — ABNORMAL HIGH (ref 0.50–1.35)
GFR calc Af Amer: 24 mL/min — ABNORMAL LOW (ref >90)
GFR calc non Af Amer: 21 mL/min — ABNORMAL LOW (ref >90)
GLUCOSE: 146 mg/dL — AB (ref 70–99)
POTASSIUM: 5.1 meq/L (ref 3.7–5.3)
SODIUM: 137 meq/L (ref 137–147)

## 2014-04-14 LAB — I-STAT TROPONIN, ED: Troponin i, poc: 0.03 ng/mL (ref 0.00–0.08)

## 2014-04-14 LAB — URINE MICROSCOPIC-ADD ON

## 2014-04-14 LAB — DIFFERENTIAL
BASOS ABS: 0 10*3/uL (ref 0.0–0.1)
Basophils Relative: 0 % (ref 0–1)
Eosinophils Absolute: 0.2 10*3/uL (ref 0.0–0.7)
Eosinophils Relative: 2 % (ref 0–5)
LYMPHS PCT: 7 % — AB (ref 12–46)
Lymphs Abs: 0.8 10*3/uL (ref 0.7–4.0)
MONO ABS: 0.8 10*3/uL (ref 0.1–1.0)
Monocytes Relative: 8 % (ref 3–12)
Neutro Abs: 8.5 10*3/uL — ABNORMAL HIGH (ref 1.7–7.7)
Neutrophils Relative %: 83 % — ABNORMAL HIGH (ref 43–77)

## 2014-04-14 LAB — PRO B NATRIURETIC PEPTIDE: PRO B NATRI PEPTIDE: 4504 pg/mL — AB (ref 0–125)

## 2014-04-14 LAB — STREP PNEUMONIAE URINARY ANTIGEN: STREP PNEUMO URINARY ANTIGEN: NEGATIVE

## 2014-04-14 LAB — I-STAT CG4 LACTIC ACID, ED: Lactic Acid, Venous: 2.27 mmol/L — ABNORMAL HIGH (ref 0.5–2.2)

## 2014-04-14 MED ORDER — ACETAMINOPHEN 325 MG PO TABS: 650.0000 mg | ORAL_TABLET | Freq: Four times a day (QID) | ORAL | Status: DC | PRN

## 2014-04-14 MED ORDER — AMIODARONE HCL 200 MG PO TABS
200.0000 mg | ORAL_TABLET | Freq: Every day | ORAL | Status: DC
  Administered 2014-04-15 – 2014-04-16 (×2): 200 mg via ORAL
  Filled 2014-04-14 (×2): qty 1

## 2014-04-14 MED ORDER — IPRATROPIUM-ALBUTEROL 0.5-2.5 (3) MG/3ML IN SOLN: 3.0000 mL | RESPIRATORY_TRACT | Status: DC

## 2014-04-14 MED ORDER — FUROSEMIDE 10 MG/ML IJ SOLN
40.0000 mg | Freq: Every day | INTRAMUSCULAR | Status: DC
  Administered 2014-04-14 – 2014-04-15 (×2): 40 mg via INTRAVENOUS
  Filled 2014-04-14: qty 4

## 2014-04-14 MED ORDER — ONDANSETRON HCL 4 MG/2ML IJ SOLN: 4.0000 mg | Freq: Four times a day (QID) | INTRAMUSCULAR | Status: DC | PRN

## 2014-04-14 MED ORDER — IPRATROPIUM-ALBUTEROL 0.5-2.5 (3) MG/3ML IN SOLN
3.0000 mL | Freq: Once | RESPIRATORY_TRACT | Status: AC
  Administered 2014-04-14: 3 mL via RESPIRATORY_TRACT

## 2014-04-14 MED ORDER — PANTOPRAZOLE SODIUM 40 MG PO TBEC
40.0000 mg | DELAYED_RELEASE_TABLET | Freq: Every day | ORAL | Status: DC
  Administered 2014-04-15 – 2014-04-16 (×2): 40 mg via ORAL
  Filled 2014-04-14 (×2): qty 1

## 2014-04-14 MED ORDER — ATORVASTATIN CALCIUM 40 MG PO TABS
40.0000 mg | ORAL_TABLET | Freq: Every day | ORAL | Status: DC
  Administered 2014-04-14 – 2014-04-15 (×2): 40 mg via ORAL
  Filled 2014-04-14 (×3): qty 1

## 2014-04-14 MED ORDER — METOPROLOL SUCCINATE ER 50 MG PO TB24
75.0000 mg | ORAL_TABLET | Freq: Every day | ORAL | Status: DC
  Administered 2014-04-15 – 2014-04-16 (×2): 75 mg via ORAL
  Filled 2014-04-14 (×2): qty 1

## 2014-04-14 MED ORDER — ALBUTEROL SULFATE (2.5 MG/3ML) 0.083% IN NEBU: 2.5000 mg | INHALATION_SOLUTION | RESPIRATORY_TRACT | Status: DC | PRN

## 2014-04-14 MED ORDER — DILTIAZEM HCL ER COATED BEADS 360 MG PO CP24
360.0000 mg | ORAL_CAPSULE | Freq: Every day | ORAL | Status: DC
  Administered 2014-04-15 – 2014-04-16 (×2): 360 mg via ORAL
  Filled 2014-04-14 (×2): qty 1

## 2014-04-14 MED ORDER — DEXTROSE 5 % IV SOLN
1.0000 g | Freq: Once | INTRAVENOUS | Status: AC
  Administered 2014-04-14: 1 g via INTRAVENOUS
  Filled 2014-04-14: qty 10

## 2014-04-14 MED ORDER — DEXTROSE 5 % IV SOLN
500.0000 mg | INTRAVENOUS | Status: DC
  Administered 2014-04-15: 500 mg via INTRAVENOUS
  Filled 2014-04-14 (×2): qty 500

## 2014-04-14 MED ORDER — TIOTROPIUM BROMIDE MONOHYDRATE 18 MCG IN CAPS
18.0000 ug | ORAL_CAPSULE | Freq: Every day | RESPIRATORY_TRACT | Status: DC
  Administered 2014-04-15 – 2014-04-16 (×2): 18 ug via RESPIRATORY_TRACT
  Filled 2014-04-14: qty 5

## 2014-04-14 MED ORDER — METHYLPREDNISOLONE ACETATE 40 MG/ML IJ SUSP
40.0000 mg | Freq: Once | INTRAMUSCULAR | Status: AC
Start: 2014-04-14 — End: 2014-04-14
  Administered 2014-04-14: 40 mg via INTRAMUSCULAR

## 2014-04-14 MED ORDER — LINAGLIPTIN 5 MG PO TABS
5.0000 mg | ORAL_TABLET | Freq: Every day | ORAL | Status: DC
  Administered 2014-04-15 – 2014-04-16 (×2): 5 mg via ORAL
  Filled 2014-04-14 (×2): qty 1

## 2014-04-14 MED ORDER — DEXTROSE 5 % IV SOLN
1.0000 g | INTRAVENOUS | Status: DC
  Administered 2014-04-15: 1 g via INTRAVENOUS
  Filled 2014-04-14 (×2): qty 10

## 2014-04-14 MED ORDER — RIVAROXABAN 15 MG PO TABS
15.0000 mg | ORAL_TABLET | Freq: Every day | ORAL | Status: DC
  Administered 2014-04-14 – 2014-04-15 (×2): 15 mg via ORAL
  Filled 2014-04-14 (×3): qty 1

## 2014-04-14 MED ORDER — BUDESONIDE-FORMOTEROL FUMARATE 160-4.5 MCG/ACT IN AERO
2.0000 | INHALATION_SPRAY | Freq: Two times a day (BID) | RESPIRATORY_TRACT | Status: DC
  Administered 2014-04-15 – 2014-04-16 (×3): 2 via RESPIRATORY_TRACT
  Filled 2014-04-14: qty 6

## 2014-04-14 MED ORDER — SALINE SPRAY 0.65 % NA SOLN
1.0000 | NASAL | Status: DC | PRN
  Filled 2014-04-14: qty 44

## 2014-04-14 MED ORDER — DEXTROSE 5 % IV SOLN
500.0000 mg | Freq: Once | INTRAVENOUS | Status: AC
  Administered 2014-04-14: 500 mg via INTRAVENOUS
  Filled 2014-04-14: qty 500

## 2014-04-14 NOTE — ED Notes (Signed)
Admitting MD at bedside.

## 2014-04-14 NOTE — Progress Notes (Addendum)
Patient ID: Mark [name], male   DOB: 08-20-1946, 68 y.o.   MRN: [id number]   Subjective:    Patient ID: Mark ChickHorace Thomas Leaming, male    DOB: 08-20-1946, 68 y.o.   MRN: [id number]  Patient presents for SOB  68 year old male with a very complex medical history consisting of heart failure atrial fibrillation COPD and CKG stage IV. He presented to our office on July 16 at that time he was having increased shortness of breath as well as wheezing. He was getting prednisone and was advised to use his COPD medications, it chest x-ray was ordered however he did not get this done. He returns today with worsening as of breath. He states he didn't feel good for about 2 or 3 days but then it came back last Thursday. He states he thinks his oxygen is been in the 60s. He does not wear oxygen at home. He denies any chest pain. He has had cough with a thick sputum production as well as some wheezing. He also states that he fell his oxygen troponin because his legs are so weak he Baldo AshCarl A. to get around at times.   Review Of Systems:  GEN- + fatigue, fever, weight loss,weakness, recent illness HEENT- denies eye drainage, change in vision, nasal discharge, CVS- denies chest pain, palpitations RESP-+SOB, +cough,+wheeze ABD- denies N/V, change in stools, abd pain Neuro- denies headache, dizziness, syncope, seizure activity       Objective:    BP 144/72  Pulse 77  Temp(Src) 98.3 F (36.8 C)  Resp 24  Ht 5\' 5"  (1.651 m)  Wt 145 lb (65.772 kg)  BMI 24.13 kg/m2  SpO2 74% GEN- NAD, alert and oriented x3 HEENT- PERRL, EOMI, non injected sclera, pink conjunctiva, MMM, oropharynx clear Neck- Supple, no LAD CVS- RRR, no murmur RESP-increased WOB, + retractions noted, scattered wheeze at bases, fair air movement, oxygen sat 96% on 2 L, s/p neb ambulating 78% on RA, speaking in full sentences EXT- No edema Pulses- Radial 2+        Assessment & Plan:      Problem List Items Addressed This Visit    COPD exacerbation - Primary   Relevant Medications      methylPREDNISolone acetate (DEPO-MEDROL) injection 40 mg (Completed)      ipratropium-albuterol (DUONEB) 0.5-2.5 (3) MG/3ML nebulizer solution 3 mL (Completed)      Note: This dictation was prepared with Dragon dictation along with smaller phrase technology. Any transcriptional errors that result from this process are unintentional.

## 2014-04-14 NOTE — ED Notes (Signed)
Pt reports having sob since Thursday, hx of copd. Pt went to pcp office today and had spo2 74% and sent here by pov for eval. At triage, spo2 82% on room air. Pt denies any cp.

## 2014-04-14 NOTE — ED Notes (Signed)
Jen, PA at bedside.

## 2014-04-14 NOTE — ED Notes (Signed)
Pt will be going to 3E Rm 25 per RN on floor.

## 2014-04-14 NOTE — Patient Instructions (Signed)
Go directly to the ER Hypoxia

## 2014-04-14 NOTE — Assessment & Plan Note (Addendum)
I think his hypoxia is related to his COPD. He's failed outpatient treatment with his home regimen as well as prednisone. I think he will likely need long-term oxygen this is about the 2nd or 3rd time, his oxygen was this low , a few months ago he was actually hypoxic to the 40s He's been sent to the ER as he is declined going by EMS though he does need oxygen at this time. He needs chest x-ray as well as oxygen therapy IV medications   In office given 2 L oxygen, Depo Medrol 40mg , Duoneb

## 2014-04-14 NOTE — H&P (Signed)
[facility] History and Physical  Mark ChickHorace Thomas Wease ZOX:[id number] DOB: 04/14/1946 DOA: 04/14/2014  Referring physician: EDP PCP: Leo GrosserPICKARD,WARREN TOM, MD   Chief Complaint: sent to ER due to Hypoxia  HPI: Mark [name] is a 68 y.o. male with PMH of COPD, chronic diastolic CHF, Afib on Xarelto, HTN, CKD 3-4, presents to the ER with the above complaints. He reports feeling poorly since Thursday, noticed increased dyspnea, cough and weakness for 3-4 days. No fevers or chills, no wheezing, no orthopnea, no leg swelling Reports compliance with lasix, has used his inhaler in the last couple of days without much benefit. [facility] he was noted to be hypoxic and [facility]. In ER, noted to have O2 sats of 74% on RA initially, improved to 95% on 3L, CXR with CAP   Review of Systems:  Constitutional:  No weight loss, night sweats, Fevers, chills, fatigue.  HEENT:  No headaches, Difficulty swallowing,Tooth/dental problems,Sore throat,  No sneezing, itching, ear ache, nasal congestion, post nasal drip,  Cardio-vascular:  No chest pain, Orthopnea, PND, swelling in lower extremities, anasarca, dizziness, palpitations  GI:  No heartburn, indigestion, abdominal pain, nausea, vomiting, diarrhea, change in bowel habits, loss of appetite  Resp:  shortness of breath with exertion or at rest. No excess mucus, no productive cough, No non-productive cough, No coughing up of blood.No change in color of mucus.No wheezing.No chest wall deformity  Skin:  no rash or lesions.  GU:  no dysuria, change in color of urine, no urgency or frequency. No flank pain.  Musculoskeletal:  No joint pain or swelling. No decreased range of motion. No back pain.  Psych:  No change in mood or affect. No depression or anxiety. No memory loss.   Past Medical History  Diagnosis Date  . GERD (gastroesophageal reflux disease)   . Hyperlipidemia   . H/O hiatal hernia    . Hypertension     Sees Dr. Lynnea FerrierWarren Pickard  . AAA (abdominal aortic aneurysm)   . COPD (chronic obstructive pulmonary disease)   . Diastolic heart failure     a.  Echo (08/13/2013): EF 55-60%, normal wall motion, mild MR, mild LAE  . Pneumonia   . Arthritis     "little in my hands" (09/08/2013)  . Chronic kidney disease (CKD), stage III (moderate)   . Atrial fibrillation     a. amiodarone Rx started 08/2013;  b. s/p TEE-DCCV 09/2013 => recurrent AFib => repeat DCCV => NSR;   c. Xarelto 15 QD due to CKD  . Hx of cardiovascular stress test     a. Myoview 09/2012:  no scar or ischemia   Past Surgical History  Procedure Laterality Date  . Knee arthroscopy Right   . Abdominal aortic aneurysm repair  10/15/2012    Procedure: ANEURYSM ABDOMINAL AORTIC REPAIR;  Surgeon: Pryor OchoaJames D Lawson, MD;  [facility];  Service: Vascular;  Laterality: N/A;  Resection and Grafting of Abdominal Aortic Aneurysm - Aortobi-Iliac   . Tee without cardioversion N/A 09/22/2013    Procedure: TRANSESOPHAGEAL ECHOCARDIOGRAM (TEE);  Surgeon: Lars MassonKatarina H Nelson, MD;  [facility];  Service: Cardiovascular;  Laterality: N/A;  . Cardioversion N/A 09/22/2013    Procedure: CARDIOVERSION;  Surgeon: Lars MassonKatarina H Nelson, MD;  [facility];  Service: Cardiovascular;  Laterality: N/A;  . Abdominal aortic aneurysm repair  10-15-2012  . Cardioversion  Jan. 12, 2015    Dr. Doylene CanningGregg W. Ladona Ridgelaylor   Social History:  reports that  he quit smoking about 8 months ago. His smoking use included Cigarettes. He has a 56 pack-year smoking history. He has quit using smokeless tobacco. His smokeless tobacco use included Chew. He reports that he does not drink alcohol or use illicit drugs.  Allergies  Allergen Reactions  . Niaspan [Niacin Er] Anaphylaxis    Family History  Problem Relation Age of Onset  . Diabetes Mother   . Tuberculosis Mother   . Heart disease Father   . Heart attack Father   . Heart disease Son     Blood  clot:  Arm     Prior to Admission medications   Medication Sig Start Date End Date Taking? Authorizing Provider  albuterol (VENTOLIN HFA) 108 (90 BASE) MCG/ACT inhaler Inhale 2 puffs into the lungs every 6 (six) hours as needed for wheezing or shortness of breath.   Yes Historical Provider, MD  amiodarone (PACERONE) 200 MG tablet Take 1 tablet (200 mg total) by mouth daily. 11/13/13  Yes Donita Brooks, MD  atorvastatin (LIPITOR) 40 MG tablet Take 40 mg by mouth daily. 03/22/14  Yes Historical Provider, MD  budesonide-formoterol (SYMBICORT) 160-4.5 MCG/ACT inhaler Inhale 2 puffs into the lungs 2 (two) times daily. 02/24/14  Yes Donita Brooks, MD  diltiazem (CARDIZEM CD) 360 MG 24 hr capsule Take 360 mg by mouth daily.  12/21/13  Yes Historical Provider, MD  esomeprazole (NEXIUM) 40 MG capsule Take 40 mg by mouth daily at 12 noon.   Yes Historical Provider, MD  furosemide (LASIX) 40 MG tablet Take 40 mg by mouth daily.   Yes Historical Provider, MD  linagliptin (TRADJENTA) 5 MG TABS tablet Take 1 tablet (5 mg total) by mouth daily. 02/24/14  Yes Donita Brooks, MD  metoprolol succinate (TOPROL-XL) 25 MG 24 hr tablet Take 75 mg by mouth daily.   Yes Historical Provider, MD  Rivaroxaban (XARELTO) 15 MG TABS tablet Take 15 mg by mouth daily.   Yes Historical Provider, MD  sodium chloride (OCEAN) 0.65 % SOLN nasal spray Place 1 spray into both nostrils as needed for congestion.   Yes Historical Provider, MD  tiotropium (SPIRIVA HANDIHALER) 18 MCG inhalation capsule Place 1 capsule (18 mcg total) into inhaler and inhale daily. 02/24/14  Yes Donita Brooks, MD   Physical Exam: Filed Vitals:   04/14/14 1615 04/14/14 1630 04/14/14 1645 04/14/14 1700  BP: 124/64 119/64 119/63 119/64  Pulse: 60 58 58 55  Temp:      TempSrc:      Resp: 16 19 17 21   SpO2: 96% 96% 97% 95%    Wt Readings from Last 3 Encounters:  04/14/14 65.772 kg (145 lb)  04/01/14 65.318 kg (144 lb)  01/21/14 67.132 kg (148 lb)      General:  Appears calm and comfortable, AAOx3, no distress Eyes: PERRL, normal lids, irises & conjunctiva ENT: grossly normal hearing, lips & tongue Neck: no LAD, masses or thyromegaly Cardiovascular: RRR, no m/r/g. No LE edema. Respiratory: poor air movement bilaterally, scattered basilar ronchi. Normal respiratory effort. Abdomen: soft, NT, ND, BS present Skin: no rash or induration seen on limited exam Musculoskeletal: grossly normal tone BUE/BLE Psychiatric: grossly normal mood and affect, speech fluent and appropriate Neurologic: grossly non-focal.          Labs on Admission:  Basic Metabolic Panel:  Recent Labs Lab 04/14/14 1355  NA 137  K 5.1  CL 94*  CO2 26  GLUCOSE 146*  BUN 34*  CREATININE 2.87*  CALCIUM  9.3   Liver Function Tests: No results found for this basename: AST, ALT, ALKPHOS, BILITOT, PROT, ALBUMIN,  in the last 168 hours No results found for this basename: LIPASE, AMYLASE,  in the last 168 hours No results found for this basename: AMMONIA,  in the last 168 hours CBC:  Recent Labs Lab 04/14/14 1355 04/14/14 1516  WBC 11.1*  --   NEUTROABS  --  8.5*  HGB 10.4*  --   HCT 32.4*  --   MCV 85.0  --   PLT 298  --    Cardiac Enzymes:  Recent Labs Lab 04/14/14 1432  TROPONINI <0.30    BNP (last 3 results)  Recent Labs  12/10/13 1148 12/14/13 1211 04/14/14 1355  PROBNP 4505.00* 3274.00* 4504.0*   CBG: No results found for this basename: GLUCAP,  in the last 168 hours  Radiological Exams on Admission: Dg Chest Port 1 View  04/14/2014   CLINICAL DATA:  Shortness of breath. With history of COPD and atrial fibrillation  EXAM: PORTABLE CHEST - 1 VIEW  COMPARISON:  PA and lateral chest x-ray of December 23, 2013  FINDINGS: There is new increased interstitial density throughout much of the left lung consistent with pneumonia. There may be a tiny amount of pleural fluid but this is not new. The right lung is clear. The heart and pulmonary  vascularity are normal. The bony thorax exhibits no acute abnormality.  IMPRESSION: New increased interstitial density in the left lung is consistent with pneumonia. There is underlying COPD. A follow-up PA and lateral chest x-ray following therapy is recommended to assure clearing.   Electronically Signed   By: David  [place]   On: 04/14/2014 15:02   Assessment/Plan   Acute hypoxic respiratory failure -due to CAP, underlying COPD    CAP (community acquired pneumonia) -IV Rocephin/Zithromax -check urine legionella/pneumo Ag -wean O2 as tolerated -albuterol PRN -needs FU CXR in 4-6 weeks to ensure resolution    HTN (hypertension) -stable, continue home meds    Atrial fibrillation -rate controlled -continue Toprol, Diltiazem, Xarelto    Stage III chronic kidney disease -stable, monitor, needs Outpt Renal referral    Acute on chronic diastolic CHF (congestive heart failure) -suspect a mild component, BNp up compared to baseline, IV lasix for 1-2days then transition to PO    COPD (chronic obstructive pulmonary disease) -stable, continue spiriva, symbicort and albuterol PRN  Code Status:DNR DVT Prophylaxis: on xarelto Family Communication: no family at bedside Disposition Plan: inpatient  Time spent:  [facility] Pager [phone]  **Disclaimer: This note may have been dictated with voice recognition software. Similar sounding words can inadvertently be transcribed and this note may contain transcription errors which may not have been corrected upon publication of note.**

## 2014-04-14 NOTE — ED Notes (Signed)
Dr. Delo at bedside. 

## 2014-04-14 NOTE — Assessment & Plan Note (Signed)
He appears to be compensated his weight has been stable I do not see any evidence of fluid overload

## 2014-04-14 NOTE — ED Provider Notes (Signed)
Medical screening examination/treatment/procedure(s) were performed by non-physician practitioner and as supervising physician I was immediately available for consultation/collaboration.   Date: 04/14/2014  Rate: 70  Rhythm: normal sinus rhythm  QRS Axis: normal  Intervals: normal  ST/T Wave abnormalities: normal  Conduction Disutrbances:none  Narrative Interpretation:   Old EKG Reviewed: unchanged    Geoffery Lyonsouglas Delano Scardino, MD 04/14/14 1954

## 2014-04-14 NOTE — ED Notes (Signed)
Notified dr. Judd Liendelo for patients lab results of cg4+lactic acid ,@17 :pm ,04/14/2014.

## 2014-04-14 NOTE — ED Provider Notes (Signed)
CSN: [id number]     Arrival date & time 04/14/14  1325 History   First MD Initiated Contact with Patient 04/14/14 1430     Chief Complaint  Patient presents with  . Shortness of Breath     (Consider location/radiation/quality/duration/timing/severity/associated sxs/prior Treatment) HPI Comments: Patient is a 68 yo M PMHx significant for COPD (no home O2), HLD, CHF, A. Fib, AAA presenting to the ED for shortness of breath and hypoxia from PCP office. Patient states he has had shortness of breath and fatigue since Thursday. Alleviating factors: none. Aggravating factors: none. Medications tried prior to arrival: inhalers with minimal improvement. Denies any fevers, chills, chest pain, nausea, vomiting, abdominal pain, leg swelling.   Patient is a 68 y.o. male presenting with shortness of breath.  Shortness of Breath   Past Medical History  Diagnosis Date  . GERD (gastroesophageal reflux disease)   . Hyperlipidemia   . H/O hiatal hernia   . Hypertension     Sees Dr. Lynnea FerrierWarren Pickard  . AAA (abdominal aortic aneurysm)   . COPD (chronic obstructive pulmonary disease)   . Diastolic heart failure     a.  Echo (08/13/2013): EF 55-60%, normal wall motion, mild MR, mild LAE  . Pneumonia   . Arthritis     "little in my hands" (09/08/2013)  . Chronic kidney disease (CKD), stage III (moderate)   . Atrial fibrillation     a. amiodarone Rx started 08/2013;  b. s/p TEE-DCCV 09/2013 => recurrent AFib => repeat DCCV => NSR;   c. Xarelto 15 QD due to CKD  . Hx of cardiovascular stress test     a. Myoview 09/2012:  no scar or ischemia   Past Surgical History  Procedure Laterality Date  . Knee arthroscopy Right   . Abdominal aortic aneurysm repair  10/15/2012    Procedure: ANEURYSM ABDOMINAL AORTIC REPAIR;  Surgeon: Pryor OchoaJames D Lawson, MD;  [facility];  Service: Vascular;  Laterality: N/A;  Resection and Grafting of Abdominal Aortic Aneurysm - Aortobi-Iliac   . Tee without cardioversion N/A 09/22/2013     Procedure: TRANSESOPHAGEAL ECHOCARDIOGRAM (TEE);  Surgeon: Lars MassonKatarina H Nelson, MD;  [facility];  Service: Cardiovascular;  Laterality: N/A;  . Cardioversion N/A 09/22/2013    Procedure: CARDIOVERSION;  Surgeon: Lars MassonKatarina H Nelson, MD;  [facility];  Service: Cardiovascular;  Laterality: N/A;  . Abdominal aortic aneurysm repair  10-15-2012  . Cardioversion  Jan. 12, 2015    Dr. Doylene CanningGregg W. Ladona Ridgelaylor   Family History  Problem Relation Age of Onset  . Diabetes Mother   . Tuberculosis Mother   . Heart disease Father   . Heart attack Father   . Heart disease Son     Blood clot:  Arm   History  Substance Use Topics  . Smoking status: Former Smoker -- 1.00 packs/day for 56 years    Types: Cigarettes    Quit date: 07/31/2013  . Smokeless tobacco: Former NeurosurgeonUser    Types: Chew     Comment: 09/08/2013 "stopped smoking cigarettes ~ 2 months ago; aien't chew in probably 10 yrs"  . Alcohol Use: No    Review of Systems  Respiratory: Positive for shortness of breath.   All other systems reviewed and are negative.     Allergies  Niaspan  Home Medications   Prior to Admission medications   Medication Sig Start Date End Date Taking? Authorizing Provider  albuterol (VENTOLIN HFA) 108 (90 BASE) MCG/ACT inhaler Inhale 2 puffs into the lungs  every 6 (six) hours as needed for wheezing or shortness of breath.   Yes Historical Provider, MD  amiodarone (PACERONE) 200 MG tablet Take 1 tablet (200 mg total) by mouth daily. 11/13/13  Yes Donita Brooks, MD  atorvastatin (LIPITOR) 40 MG tablet Take 40 mg by mouth daily. 03/22/14  Yes Historical Provider, MD  budesonide-formoterol (SYMBICORT) 160-4.5 MCG/ACT inhaler Inhale 2 puffs into the lungs 2 (two) times daily. 02/24/14  Yes Donita Brooks, MD  diltiazem (CARDIZEM CD) 360 MG 24 hr capsule Take 360 mg by mouth daily.  12/21/13  Yes Historical Provider, MD  esomeprazole (NEXIUM) 40 MG capsule Take 40 mg by mouth daily at 12 noon.   Yes  Historical Provider, MD  furosemide (LASIX) 40 MG tablet Take 40 mg by mouth daily.   Yes Historical Provider, MD  linagliptin (TRADJENTA) 5 MG TABS tablet Take 1 tablet (5 mg total) by mouth daily. 02/24/14  Yes Donita Brooks, MD  metoprolol succinate (TOPROL-XL) 25 MG 24 hr tablet Take 75 mg by mouth daily.   Yes Historical Provider, MD  Rivaroxaban (XARELTO) 15 MG TABS tablet Take 15 mg by mouth daily.   Yes Historical Provider, MD  sodium chloride (OCEAN) 0.65 % SOLN nasal spray Place 1 spray into both nostrils as needed for congestion.   Yes Historical Provider, MD  tiotropium (SPIRIVA HANDIHALER) 18 MCG inhalation capsule Place 1 capsule (18 mcg total) into inhaler and inhale daily. 02/24/14  Yes Donita Brooks, MD   BP 119/64  Pulse 55  Temp(Src) 98.2 F (36.8 C) (Oral)  Resp 21  SpO2 95% Physical Exam  Constitutional: He is oriented to person, place, and time. He appears well-developed and well-nourished. He appears distressed. Nasal cannula in place.  HENT:  Head: Normocephalic and atraumatic.  Right Ear: External ear normal.  Left Ear: External ear normal.  Nose: Nose normal.  Eyes: Conjunctivae are normal.  Neck: Neck supple.  Cardiovascular: Normal rate, regular rhythm and normal heart sounds.   Pulmonary/Chest: Accessory muscle usage present. No respiratory distress. He has decreased breath sounds. He has no wheezes. He has no rhonchi. He has no rales. He exhibits no tenderness.  Abdominal: Soft. There is no tenderness.  Musculoskeletal: He exhibits no edema.  Neurological: He is alert and oriented to person, place, and time.  Skin: Skin is warm and dry. He is not diaphoretic.    ED Course  Procedures (including critical care time) Medications  azithromycin (ZITHROMAX) 500 mg in dextrose 5 % 250 mL IVPB (not administered)  cefTRIAXone (ROCEPHIN) 1 g in dextrose 5 % 50 mL IVPB (not administered)  atorvastatin (LIPITOR) tablet 40 mg (not administered)  metoprolol  succinate (TOPROL-XL) 24 hr tablet 75 mg (not administered)  Rivaroxaban (XARELTO) tablet 15 mg (not administered)  sodium chloride (OCEAN) 0.65 % nasal spray 1 spray (not administered)  budesonide-formoterol (SYMBICORT) 160-4.5 MCG/ACT inhaler 2 puff (not administered)  pantoprazole (PROTONIX) EC tablet 40 mg (not administered)  linagliptin (TRADJENTA) tablet 5 mg (not administered)  tiotropium (SPIRIVA) inhalation capsule 18 mcg (not administered)  diltiazem (CARDIZEM CD) 24 hr capsule 360 mg (not administered)  amiodarone (PACERONE) tablet 200 mg (not administered)  furosemide (LASIX) injection 40 mg (not administered)  acetaminophen (TYLENOL) tablet 650 mg (not administered)  ondansetron (ZOFRAN) injection 4 mg (not administered)  albuterol (PROVENTIL) (2.5 MG/3ML) 0.083% nebulizer solution 2.5 mg (not administered)  cefTRIAXone (ROCEPHIN) 1 g in dextrose 5 % 50 mL IVPB (0 g Intravenous Stopped 04/14/14 1645)  azithromycin (ZITHROMAX) 500 mg in dextrose 5 % 250 mL IVPB (500 mg Intravenous New Bag/Given 04/14/14 1548)    Labs Review Labs Reviewed  BASIC METABOLIC PANEL - Abnormal; Notable for the following:    Chloride 94 (*)    Glucose, Bld 146 (*)    BUN 34 (*)    Creatinine, Ser 2.87 (*)    GFR calc non Af Amer 21 (*)    GFR calc Af Amer 24 (*)    Anion gap 17 (*)    All other components within normal limits  PRO B NATRIURETIC PEPTIDE - Abnormal; Notable for the following:    Pro B Natriuretic peptide (BNP) 4504.0 (*)    All other components within normal limits  CBC - Abnormal; Notable for the following:    WBC 11.1 (*)    RBC 3.81 (*)    Hemoglobin 10.4 (*)    HCT 32.4 (*)    RDW 18.3 (*)    All other components within normal limits  URINALYSIS, ROUTINE W REFLEX MICROSCOPIC - Abnormal; Notable for the following:    Protein, ur 30 (*)    All other components within normal limits  DIFFERENTIAL - Abnormal; Notable for the following:    Neutrophils Relative % 83 (*)     Neutro Abs 8.5 (*)    Lymphocytes Relative 7 (*)    All other components within normal limits  URINE MICROSCOPIC-ADD ON - Abnormal; Notable for the following:    Casts GRANULAR CAST (*)    All other components within normal limits  I-STAT CG4 LACTIC ACID, ED - Abnormal; Notable for the following:    Lactic Acid, Venous 2.27 (*)    All other components within normal limits  I-STAT ARTERIAL BLOOD GAS, ED - Abnormal; Notable for the following:    pH, Arterial 7.475 (*)    pO2, Arterial 68.0 (*)    Bicarbonate 25.9 (*)    All other components within normal limits  CULTURE, BLOOD (ROUTINE X 2)  CULTURE, BLOOD (ROUTINE X 2)  URINE CULTURE  TROPONIN I  LEGIONELLA ANTIGEN, URINE  STREP PNEUMONIAE URINARY ANTIGEN  COMPREHENSIVE METABOLIC PANEL  CBC  I-STAT TROPOININ, ED    Imaging Review Dg Chest Port 1 View  04/14/2014   CLINICAL DATA:  Shortness of breath. With history of COPD and atrial fibrillation  EXAM: PORTABLE CHEST - 1 VIEW  COMPARISON:  PA and lateral chest x-ray of December 23, 2013  FINDINGS: There is new increased interstitial density throughout much of the left lung consistent with pneumonia. There may be a tiny amount of pleural fluid but this is not new. The right lung is clear. The heart and pulmonary vascularity are normal. The bony thorax exhibits no acute abnormality.  IMPRESSION: New increased interstitial density in the left lung is consistent with pneumonia. There is underlying COPD. A follow-up PA and lateral chest x-ray following therapy is recommended to assure clearing.   Electronically Signed   By: David  [place]   On: 04/14/2014 15:02     EKG Interpretation None      MDM   Final diagnoses:  CAP (community acquired pneumonia)  Hypoxia    Filed Vitals:   04/14/14 1700  BP: 119/64  Pulse: 55  Temp:   Resp: 21   Patient presenting from PCP office for shortness of breath with new hypoxia. Initially with tachypnea and accessory muscle use and noted to be  hypoxic on room air. Shortness of breath and associated symptoms improved with  3 L of oxygen via nasal cannula. Mild accessory muscle use still noted. I have reviewed nursing notes, vital signs, and all appropriate lab and imaging results for this patient. Patient noted to have pneumonia on chest x-ray. He presents from home this is likely due to community-acquired pneumonia. Will start azithromycin and Rocephin. Plan to admit patient to hospital service for further evaluation and management of symptoms. Patient d/w with Dr. Judd Lien, agrees with plan.       Lise Auer Graceanne Guin, PA-C 04/14/14 1800

## 2014-04-15 DIAGNOSIS — I4891 Unspecified atrial fibrillation: Secondary | ICD-10-CM

## 2014-04-15 DIAGNOSIS — I10 Essential (primary) hypertension: Secondary | ICD-10-CM

## 2014-04-15 DIAGNOSIS — Z7901 Long term (current) use of anticoagulants: Secondary | ICD-10-CM

## 2014-04-15 DIAGNOSIS — J9600 Acute respiratory failure, unspecified whether with hypoxia or hypercapnia: Secondary | ICD-10-CM

## 2014-04-15 LAB — COMPREHENSIVE METABOLIC PANEL
ALK PHOS: 63 U/L (ref 39–117)
ALT: 50 U/L (ref 0–53)
AST: 38 U/L — AB (ref 0–37)
Albumin: 2.2 g/dL — ABNORMAL LOW (ref 3.5–5.2)
Anion gap: 15 (ref 5–15)
BILIRUBIN TOTAL: 0.4 mg/dL (ref 0.3–1.2)
BUN: 35 mg/dL — ABNORMAL HIGH (ref 6–23)
CHLORIDE: 95 meq/L — AB (ref 96–112)
CO2: 23 meq/L (ref 19–32)
CREATININE: 2.63 mg/dL — AB (ref 0.50–1.35)
Calcium: 8.6 mg/dL (ref 8.4–10.5)
GFR calc Af Amer: 27 mL/min — ABNORMAL LOW (ref >90)
GFR calc non Af Amer: 23 mL/min — ABNORMAL LOW (ref >90)
Glucose, Bld: 118 mg/dL — ABNORMAL HIGH (ref 70–99)
POTASSIUM: 4.7 meq/L (ref 3.7–5.3)
Sodium: 133 mEq/L — ABNORMAL LOW (ref 137–147)
Total Protein: 5.9 g/dL — ABNORMAL LOW (ref 6.0–8.3)

## 2014-04-15 LAB — LEGIONELLA ANTIGEN, URINE: LEGIONELLA ANTIGEN, URINE: NEGATIVE

## 2014-04-15 LAB — URINE CULTURE
COLONY COUNT: NO GROWTH
Culture: NO GROWTH

## 2014-04-15 LAB — CBC
HEMATOCRIT: 29.2 % — AB (ref 39.0–52.0)
Hemoglobin: 9.4 g/dL — ABNORMAL LOW (ref 13.0–17.0)
MCH: 27 pg (ref 26.0–34.0)
MCHC: 32.2 g/dL (ref 30.0–36.0)
MCV: 83.9 fL (ref 78.0–100.0)
PLATELETS: 266 10*3/uL (ref 150–400)
RBC: 3.48 MIL/uL — ABNORMAL LOW (ref 4.22–5.81)
RDW: 18 % — AB (ref 11.5–15.5)
WBC: 10.4 10*3/uL (ref 4.0–10.5)

## 2014-04-15 LAB — GLUCOSE, CAPILLARY: Glucose-Capillary: 122 mg/dL — ABNORMAL HIGH (ref 70–99)

## 2014-04-15 MED ORDER — FUROSEMIDE 40 MG PO TABS
40.0000 mg | ORAL_TABLET | Freq: Two times a day (BID) | ORAL | Status: DC
  Administered 2014-04-15 – 2014-04-16 (×2): 40 mg via ORAL
  Filled 2014-04-15 (×5): qty 1

## 2014-04-15 NOTE — Care Management Note (Addendum)
    Page 1 of 2   04/16/2014     2:01:51 PM CARE MANAGEMENT NOTE 04/16/2014  Patient:  Mark [name],Mark [name]   Account Number:  [id number]  Date Initiated:  04/15/2014  Documentation initiated by:  [facility],Mehreen Azizi  Subjective/Objective Assessment:   68 y.o. male with PMH of COPD, chronic diastolic CHF, Afib, HTN, CKD 3-4, presents to the ER with CAP, Hypoxia. //Home with spouse.     Action/Plan:   -IV Rocephin/Zithromax  -check urine legionella/pneumo Ag  -wean O2 as tolerated  -albuterol PRN  //[facility]   Anticipated DC Date:  04/18/2014   Anticipated DC Plan:  HOME W HOME HEALTH SERVICES      DC Planning Services  CM consult      Choice offered to / List presented to:  C-1 Patient   DME arranged  [facility]GEN      DME agency  [facility]     [facility]  HH - 11 Patient Refused      Status of service:  Completed, signed off Medicare Important Message given?   (If response is "NO", the following Medicare IM given date fields will be blank) Date Medicare IM given:   Medicare IM given by:   Date Additional Medicare IM given:   Additional Medicare IM given by:    Discharge Disposition:  HOME/SELF CARE  Per UR Regulation:  Reviewed for med. necessity/level of care/duration of stay  If discussed at Long Length of Stay Meetings, dates discussed:    Comments:  04/16/14 Oletta Cohnamellia Thaddeaus Monica, RN, BSN, NCM [phone] Spoke to pt at bedside regarding discharge planning.  Pt refused home health services.  Pt qualified for home oxygen and chose [facility] to supply DME.  DME delivered to pt room prior to d/c home.

## 2014-04-15 NOTE — Progress Notes (Addendum)
[facility] PROGRESS NOTE  Mark [name] ZOX:[id number] DOB: 1945-11-07 DOA: 04/14/2014 PCP: Leo Grosser, MD  Assessment/Plan: 1. Community acquire pneumonia -Patient presenting with hypoxemia, I have O2 sats 94% on room air in the emergency room with chest x-ray showing new increased interstitial density in the left lung -Showing clinical improvement on this mornings evaluation -Will continue empiric IV antibiotic therapy with Rocephin and ceftriaxone -Blood cultures pending  2. Acute hypoxemic respiratory failure -Evidence by an O2 sat of 74% on room air on presentation with respiratory rate of 24 -Likely secondary to community acquired pneumonia, there could be CHF component -Clinically improved, continue diuresis and IV AB therapy  3. History of atrial fibrillation -Currently in sinus rhythm -Continue amiodarone 200 mg by mouth daily, metoprolol 75 mg by mouth daily and anticoagulation with Xarelto  4. Hypertension -Blood pressure stable, last blood pressure reading of 120/65, continue metoprolol 75 mg by mouth daily  5. Possible Acute on Chronic diastolic congestive heart failure -Last transthoracic echocardiogram performed on 09/23/2011 which showed ejection fraction of 55-60%, labs showing elevated BNP of 4504, weight decreasing from 65 to 63 kg after IV diuresis -Patient having a net negative fluid balance of 1.2 L -Will transition to Lasix 40 mg by mouth twice a day  Code Status: Full Code Family Communication:  Disposition Plan: Continue antibiotic therapy supportive care, anticipate discharge in the next 24 - 48 hours   Antibiotics:  Ceftriaxone 1 g IV every 24 hours  Azithromycin 500 mg IV every 24 hours  HPI/Subjective: Patient is a pleasant 68 year old gentleman with a past medical history of chronic obstructive pulmonary disease, history of atrial fibrillation, on chronic anticoagulation, he presented to the emergency room on 04/14/2014 with  complaints of shortness of breath, found to be hypoxemic having an oxygen saturation of 74%. Chest x-ray revealed new increased interstitial density in the left lung consistent with pneumonia. He was started on empiric IV antibiotic therapy with ACE azithromycin and ceftriaxone, admitted to telemetry.  Objective: Filed Vitals:   04/15/14 1355  BP: 128/65  Pulse: 65  Temp: 97.7 F (36.5 C)  Resp: 22    Intake/Output Summary (Last 24 hours) at 04/15/14 1527 Last data filed at 04/15/14 1355  Gross per 24 hour  Intake    480 ml  Output   1775 ml  Net  -1295 ml   Filed Weights   04/14/14 1812 04/15/14 0600  Weight: 64.456 kg (142 lb 1.6 oz) 63.231 kg (139 lb 6.4 oz)    Exam:   General:  No acute distress patient is awake alert, reports feeling better  Cardiovascular: Regular rhythm normal S1-S2  Respiratory: He has left sided crackles, on supplemental oxygen, no wheezing or rhonchi noted  Abdomen: Soft nontender nondistended  Musculoskeletal: No edema  Data Reviewed: Basic Metabolic Panel:  Recent Labs Lab 04/14/14 1355 04/15/14 0540  NA 137 133*  K 5.1 4.7  CL 94* 95*  CO2 26 23  GLUCOSE 146* 118*  BUN 34* 35*  CREATININE 2.87* 2.63*  CALCIUM 9.3 8.6   Liver Function Tests:  Recent Labs Lab 04/15/14 0540  AST 38*  ALT 50  ALKPHOS 63  BILITOT 0.4  PROT 5.9*  ALBUMIN 2.2*   No results found for this basename: LIPASE, AMYLASE,  in the last 168 hours No results found for this basename: AMMONIA,  in the last 168 hours CBC:  Recent Labs Lab 04/14/14 1355 04/14/14 1516 04/15/14 0540  WBC 11.1*  --  10.4  NEUTROABS  --  8.5*  --   HGB 10.4*  --  9.4*  HCT 32.4*  --  29.2*  MCV 85.0  --  83.9  PLT 298  --  266   Cardiac Enzymes:  Recent Labs Lab 04/14/14 1432  TROPONINI <0.30   BNP (last 3 results)  Recent Labs  12/10/13 1148 12/14/13 1211 04/14/14 1355  PROBNP 4505.00* 3274.00* 4504.0*   CBG:  Recent Labs Lab 04/15/14 0753   GLUCAP 122*    Recent Results (from the past 240 hour(s))  CULTURE, BLOOD (ROUTINE X 2)     Status: None   Collection Time    04/14/14  3:25 PM      Result Value Ref Range Status   Specimen Description BLOOD RIGHT FOREARM   Final   Special Requests BOTTLES DRAWN AEROBIC AND ANAEROBIC 5CC   Final   Culture  Setup Time     Final   Value: 04/14/2014 18:51     Performed at [facility]   Culture     Final   Value:        BLOOD CULTURE RECEIVED NO GROWTH TO DATE CULTURE WILL BE HELD FOR 5 DAYS BEFORE ISSUING A FINAL NEGATIVE REPORT     Performed at [facility]   Report Status PENDING   Incomplete  CULTURE, BLOOD (ROUTINE X 2)     Status: None   Collection Time    04/14/14  3:44 PM      Result Value Ref Range Status   Specimen Description BLOOD RIGHT ANTECUBITAL   Final   Special Requests BOTTLES DRAWN AEROBIC AND ANAEROBIC 10CC   Final   Culture  Setup Time     Final   Value: 04/14/2014 18:51     Performed at [facility]   Culture     Final   Value:        BLOOD CULTURE RECEIVED NO GROWTH TO DATE CULTURE WILL BE HELD FOR 5 DAYS BEFORE ISSUING A FINAL NEGATIVE REPORT     Performed at [facility]   Report Status PENDING   Incomplete  URINE CULTURE     Status: None   Collection Time    04/14/14  4:39 PM      Result Value Ref Range Status   Specimen Description URINE, CLEAN CATCH   Final   Special Requests NONE   Final   Culture  Setup Time     Final   Value: 04/14/2014 17:04     Performed at [facility] Count     Final   Value: NO GROWTH     Performed at [facility]   Culture     Final   Value: NO GROWTH     Performed at [facility]   Report Status 04/15/2014 FINAL   Final     Studies: Dg Chest Port 1 View  04/14/2014   CLINICAL DATA:  Shortness of breath. With history of COPD and atrial fibrillation  EXAM: PORTABLE CHEST - 1 VIEW  COMPARISON:  PA and lateral chest x-ray of December 23, 2013  FINDINGS:  There is new increased interstitial density throughout much of the left lung consistent with pneumonia. There may be a tiny amount of pleural fluid but this is not new. The right lung is clear. The heart and pulmonary vascularity are normal. The bony thorax exhibits no acute abnormality.  IMPRESSION: New increased interstitial density in the left lung is consistent with pneumonia. There  is underlying COPD. A follow-up PA and lateral chest x-ray following therapy is recommended to assure clearing.   Electronically Signed   By: David  [place]   On: 04/14/2014 15:02    Scheduled Meds: . amiodarone  200 mg Oral Daily  . atorvastatin  40 mg Oral q1800  . azithromycin  500 mg Intravenous Q24H  . budesonide-formoterol  2 puff Inhalation BID  . cefTRIAXone (ROCEPHIN)  IV  1 g Intravenous Q24H  . diltiazem  360 mg Oral Daily  . furosemide  40 mg Intravenous Daily  . linagliptin  5 mg Oral Daily  . metoprolol succinate  75 mg Oral Daily  . pantoprazole  40 mg Oral Daily  . Rivaroxaban  15 mg Oral Q supper  . tiotropium  18 mcg Inhalation Daily   Continuous Infusions:   Principal Problem:   CAP (community acquired pneumonia) Active Problems:   HTN (hypertension)   Atrial fibrillation   Stage III chronic kidney disease   Acute on chronic diastolic CHF (congestive heart failure)   COPD (chronic obstructive pulmonary disease)    Time spent: 35 min    Jeralyn Bennett  [facility] Pager [phone]. If 7PM-7AM, please contact night-coverage at [email or web address], password [facility] 04/15/2014, 3:27 PM  LOS: 1 day

## 2014-04-15 NOTE — Progress Notes (Signed)
Patient  alert and oriented, no c/o pain , v/s stable . Will continue to monitor patient.

## 2014-04-15 NOTE — Progress Notes (Signed)
Utilization Review Completed Nikoloz Huy J. Quanda Pavlicek, RN, BSN, NCM [phone]  

## 2014-04-16 LAB — GLUCOSE, CAPILLARY: Glucose-Capillary: 143 mg/dL — ABNORMAL HIGH (ref 70–99)

## 2014-04-16 MED ORDER — LEVOFLOXACIN 500 MG PO TABS: 500.0000 mg | ORAL_TABLET | ORAL | Status: DC

## 2014-04-16 MED ORDER — ACETAMINOPHEN 325 MG PO TABS: 650.0000 mg | ORAL_TABLET | Freq: Four times a day (QID) | ORAL | Status: DC | PRN

## 2014-04-16 NOTE — Progress Notes (Signed)
The patient's VS are stable and he did not have any complaints overnight.  He states that he feels good this morning.

## 2014-04-16 NOTE — Plan of Care (Signed)
Problem: Phase I Progression Outcomes Goal: Code status addressed with pt/family Outcome: Completed/Met Date Met:  04/16/14 Patient DNR

## 2014-04-16 NOTE — Progress Notes (Signed)
Discussed discharge instructions with pt including how and when to call the dr, follow up appt, medications, prescriptions, diet, and activity. Pt verbalized understanding and denied any questions.  Juliane LackLeah Cherylee Rawlinson, RN

## 2014-04-16 NOTE — Progress Notes (Signed)
SATURATION QUALIFICATIONS: (This note is used to comply with regulatory documentation for home oxygen)  Patient Saturations on Room Air at Rest = 84%  Patient Saturations on Room Air while Ambulating = 82%  Patient Saturations on 2 Liters of oxygen while Ambulating = 90%  Please briefly explain why patient needs home oxygen:  Patient desaturated to 82% on room air while ambulating.  Patient's saturations on room air at rest 84%.

## 2014-04-16 NOTE — Plan of Care (Signed)
Problem: Phase II Progression Outcomes Goal: Wean O2 if indicated Outcome: Adequate for Discharge Patient to be discharged on home oxygen  Problem: Phase III Progression Outcomes Goal: O2 sats > or equal to 93% on room air Outcome: Adequate for Discharge Patient to be discharged on home oxygen

## 2014-04-16 NOTE — Discharge Summary (Signed)
Physician Discharge Summary  Mark [name] ZOX:[id number] DOB: August 21, 1946 DOA: 04/14/2014  PCP: Leo Grosser, MD  Admit date: 04/14/2014 Discharge date: 04/16/2014  Time spent: 35 minutes  Recommendations for Outpatient Follow-up:  1. Please follow up on patient's home oxygen needs, he was discharged on supplemental oxygen 2L [place]    Discharge Diagnoses:  Principal Problem:   CAP (community acquired pneumonia) Active Problems:   HTN (hypertension)   Atrial fibrillation   Stage III chronic kidney disease   Acute on chronic diastolic CHF (congestive heart failure)   COPD (chronic obstructive pulmonary disease)   Discharge Condition: Stable/Improved  Diet recommendation: Heart Healthy  Filed Weights   04/14/14 1812 04/15/14 0600 04/16/14 0622  Weight: 64.456 kg (142 lb 1.6 oz) 63.231 kg (139 lb 6.4 oz) 62.415 kg (137 lb 9.6 oz)    History of present illness:  Mark [name] is a 68 y.o. male with PMH of COPD, chronic diastolic CHF, Afib on Xarelto, HTN, CKD 3-4, presents to the ER with the above complaints.  He reports feeling poorly since Thursday, noticed increased dyspnea, cough and weakness for 3-4 days.  No fevers or chills, no wheezing, no orthopnea, no leg swelling  Reports compliance with lasix, has used his inhaler in the last couple of days without much benefit.  Went to [employer] office today where he was noted to be hypoxic and referred to [facility] for evaluation.  In ER, noted to have O2 sats of 74% on RA initially, improved to 95% on 3L, CXR with CAP   Hospital Course:  Patient is a pleasant 68 year old gentleman with a past medical history of chronic obstructive pulmonary disease, history of atrial fibrillation, on chronic anticoagulation, he presented to the emergency room on 04/14/2014 with complaints of shortness of breath, found to be hypoxemic having an oxygen saturation of 74%. Chest x-ray revealed new increased interstitial density in the left lung  consistent with pneumonia. He was started on empiric IV antibiotic therapy with ACE azithromycin and ceftriaxone, admitted to telemetry. 1. Community acquire pneumonia -Patient presenting with hypoxemia with O2 sats 84% on room air in the emergency room with chest x-ray showing new increased interstitial density in the left lung  -Showing clinical improvement on this mornings evaluation  -Will continue empiric IV antibiotic therapy with Rocephin and ceftriaxone  -Blood cultures remained negative -Will discharge on Levaquin 500 mg PO q 48 hours 2. Acute hypoxemic respiratory failure  -Evidence by an O2 sat of 74% on room air on presentation with respiratory rate of 24  -Likely secondary to community acquired pneumonia, there could be CHF component  -Clinically improved -Will discharge of home O2 3. History of atrial fibrillation  -Currently in sinus rhythm  -Continue amiodarone 200 mg by mouth daily, metoprolol 75 mg by mouth daily and anticoagulation with Xarelto  4. Hypertension  -Blood pressure stable, last blood pressure reading of 120/65, continue metoprolol 75 mg by mouth daily  5. Possible Acute on Chronic diastolic congestive heart failure  -Last transthoracic echocardiogram performed on 09/23/2011 which showed ejection fraction of 55-60%, labs showing elevated BNP of 4504, weight decreasing from 65 to 63 kg after IV diuresis  -Patient having a net negative fluid balance of 1.2 L  -Continue Lasix  Discharge Exam: Filed Vitals:   04/16/14 0957  BP: 136/65  Pulse: 63  Temp:   Resp: 20    General: No acute distress, awake and alert Cardiovascular: Regular rate and rhythm normal S1S2 Respiratory: Clear to auscultation bilaterally  Discharge Instructions You were cared for by a hospitalist during your hospital stay. If you have any questions about your discharge medications or the care you received while you were in the hospital after you are discharged, you can call the unit  and asked to speak with the hospitalist on call if the hospitalist that took care of you is not available. Once you are discharged, your primary care physician will handle any further medical issues. Please note that NO REFILLS for any discharge medications will be authorized once you are discharged, as it is imperative that you return to your primary care physician (or establish a relationship with a primary care physician if you do not have one) for your aftercare needs so that they can reassess your need for medications and monitor your lab values.  Discharge Instructions   Call MD for:  difficulty breathing, headache or visual disturbances    Complete by:  As directed      Call MD for:  extreme fatigue    Complete by:  As directed      Call MD for:  persistant nausea and vomiting    Complete by:  As directed      Call MD for:  severe uncontrolled pain    Complete by:  As directed      Call MD for:  temperature >100.4    Complete by:  As directed      Diet - low sodium heart healthy    Complete by:  As directed      Increase activity slowly    Complete by:  As directed             Medication List         acetaminophen 325 MG tablet  Commonly known as:  TYLENOL  Take 2 tablets (650 mg total) by mouth every 6 (six) hours as needed for mild pain (or Fever >/= 101).     amiodarone 200 MG tablet  Commonly known as:  PACERONE  Take 1 tablet (200 mg total) by mouth daily.     atorvastatin 40 MG tablet  Commonly known as:  LIPITOR  Take 40 mg by mouth daily.     budesonide-formoterol 160-4.5 MCG/ACT inhaler  Commonly known as:  SYMBICORT  Inhale 2 puffs into the lungs 2 (two) times daily.     diltiazem 360 MG 24 hr capsule  Commonly known as:  CARDIZEM CD  Take 360 mg by mouth daily.     esomeprazole 40 MG capsule  Commonly known as:  NEXIUM  Take 40 mg by mouth daily at 12 noon.     furosemide 40 MG tablet  Commonly known as:  LASIX  Take 40 mg by mouth daily.      levofloxacin 500 MG tablet  Commonly known as:  LEVAQUIN  Take 1 tablet (500 mg total) by mouth every other day.     linagliptin 5 MG Tabs tablet  Commonly known as:  TRADJENTA  Take 1 tablet (5 mg total) by mouth daily.     metoprolol succinate 25 MG 24 hr tablet  Commonly known as:  TOPROL-XL  Take 75 mg by mouth daily.     Rivaroxaban 15 MG Tabs tablet  Commonly known as:  XARELTO  Take 15 mg by mouth daily.     sodium chloride 0.65 % Soln nasal spray  Commonly known as:  OCEAN  Place 1 spray into both nostrils as needed for congestion.     tiotropium 18  MCG inhalation capsule  Commonly known as:  SPIRIVA HANDIHALER  Place 1 capsule (18 mcg total) into inhaler and inhale daily.     VENTOLIN HFA 108 (90 BASE) MCG/ACT inhaler  Generic drug:  albuterol  Inhale 2 puffs into the lungs every 6 (six) hours as needed for wheezing or shortness of breath.       Allergies  Allergen Reactions  . Niaspan [Niacin Er] Anaphylaxis       Follow-up Information   Follow up with [facility] ([place])    Contact information:   [address] [place] [address] [phone]       Follow up with [facility] TOM, MD In 1 week.   Specialty:  Family Medicine   Contact information:   [address] [place] [address] [phone]        The results of significant diagnostics from this hospitalization (including imaging, microbiology, ancillary and laboratory) are listed below for reference.    Significant Diagnostic Studies: Dg Chest Port 1 View  04/14/2014   CLINICAL DATA:  Shortness of breath. With history of COPD and atrial fibrillation  EXAM: PORTABLE CHEST - 1 VIEW  COMPARISON:  PA and lateral chest x-ray of December 23, 2013  FINDINGS: There is new increased interstitial density throughout much of the left lung consistent with pneumonia. There may be a tiny amount of pleural fluid but this is not new. The right lung is clear. The heart and  pulmonary vascularity are normal. The bony thorax exhibits no acute abnormality.  IMPRESSION: New increased interstitial density in the left lung is consistent with pneumonia. There is underlying COPD. A follow-up PA and lateral chest x-ray following therapy is recommended to assure clearing.   Electronically Signed   By: David  [place]   On: 04/14/2014 15:02    Microbiology: Recent Results (from the past 240 hour(s))  CULTURE, BLOOD (ROUTINE X 2)     Status: None   Collection Time    04/14/14  3:25 PM      Result Value Ref Range Status   Specimen Description BLOOD RIGHT FOREARM   Final   Special Requests BOTTLES DRAWN AEROBIC AND ANAEROBIC 5CC   Final   Culture  Setup Time     Final   Value: 04/14/2014 18:51     Performed at [facility]   Culture     Final   Value:        BLOOD CULTURE RECEIVED NO GROWTH TO DATE CULTURE WILL BE HELD FOR 5 DAYS BEFORE ISSUING A FINAL NEGATIVE REPORT     Performed at [facility]   Report Status PENDING   Incomplete  CULTURE, BLOOD (ROUTINE X 2)     Status: None   Collection Time    04/14/14  3:44 PM      Result Value Ref Range Status   Specimen Description BLOOD RIGHT ANTECUBITAL   Final   Special Requests BOTTLES DRAWN AEROBIC AND ANAEROBIC 10CC   Final   Culture  Setup Time     Final   Value: 04/14/2014 18:51     Performed at [facility]   Culture     Final   Value:        BLOOD CULTURE RECEIVED NO GROWTH TO DATE CULTURE WILL BE HELD FOR 5 DAYS BEFORE ISSUING A FINAL NEGATIVE REPORT     Performed at [facility]   Report Status PENDING   Incomplete  URINE CULTURE     Status: None   Collection Time    04/14/14  4:39 PM      Result Value Ref Range Status   Specimen Description URINE, CLEAN CATCH   Final   Special Requests NONE   Final   Culture  Setup Time     Final   Value: 04/14/2014 17:04     Performed at [facility]   Colony Count     Final   Value: NO GROWTH     Performed at [facility]    Culture     Final   Value: NO GROWTH     Performed at [facility]   Report Status 04/15/2014 FINAL   Final     Labs: Basic Metabolic Panel:  Recent Labs Lab 04/14/14 1355 04/15/14 0540  NA 137 133*  K 5.1 4.7  CL 94* 95*  CO2 26 23  GLUCOSE 146* 118*  BUN 34* 35*  CREATININE 2.87* 2.63*  CALCIUM 9.3 8.6   Liver Function Tests:  Recent Labs Lab 04/15/14 0540  AST 38*  ALT 50  ALKPHOS 63  BILITOT 0.4  PROT 5.9*  ALBUMIN 2.2*   No results found for this basename: LIPASE, AMYLASE,  in the last 168 hours No results found for this basename: AMMONIA,  in the last 168 hours CBC:  Recent Labs Lab 04/14/14 1355 04/14/14 1516 04/15/14 0540  WBC 11.1*  --  10.4  NEUTROABS  --  8.5*  --   HGB 10.4*  --  9.4*  HCT 32.4*  --  29.2*  MCV 85.0  --  83.9  PLT 298  --  266   Cardiac Enzymes:  Recent Labs Lab 04/14/14 1432  TROPONINI <0.30   BNP: BNP (last 3 results)  Recent Labs  12/10/13 1148 12/14/13 1211 04/14/14 1355  PROBNP 4505.00* 3274.00* 4504.0*   CBG:  Recent Labs Lab 04/15/14 0753 04/16/14 1153  GLUCAP 122* 143*       Signed:  Nelissa Bolduc  [facility] 04/16/2014, 12:34 PM

## 2014-04-16 NOTE — Plan of Care (Signed)
Problem: Phase III Progression Outcomes Goal: Activity at appropriate level-compared to baseline (UP IN CHAIR FOR HEMODIALYSIS)  Outcome: Completed/Met Date Met:  04/16/14 Patient up ad lib in room

## 2014-04-20 ENCOUNTER — Ambulatory Visit: Payer: Medicare Other | Admitting: Family Medicine

## 2014-04-20 ENCOUNTER — Inpatient Hospital Stay: Payer: Medicare Other | Admitting: Family Medicine

## 2014-04-20 LAB — CULTURE, BLOOD (ROUTINE X 2)
CULTURE: NO GROWTH
Culture: NO GROWTH

## 2014-04-23 ENCOUNTER — Encounter: Payer: Self-pay | Admitting: Family Medicine

## 2014-04-23 ENCOUNTER — Ambulatory Visit (INDEPENDENT_AMBULATORY_CARE_PROVIDER_SITE_OTHER): Payer: Medicare Other | Admitting: Family Medicine

## 2014-04-23 VITALS — BP 138/60 | HR 72 | Temp 97.5°F | Resp 18 | Wt 142.0 lb

## 2014-04-23 DIAGNOSIS — Z09 Encounter for follow-up examination after completed treatment for conditions other than malignant neoplasm: Secondary | ICD-10-CM

## 2014-04-23 DIAGNOSIS — J441 Chronic obstructive pulmonary disease with (acute) exacerbation: Secondary | ICD-10-CM

## 2014-04-23 MED ORDER — PREDNISONE 20 MG PO TABS: ORAL_TABLET | ORAL | Status: DC

## 2014-04-23 MED ORDER — RIVAROXABAN 15 MG PO TABS: 15.0000 mg | ORAL_TABLET | Freq: Every day | ORAL | Status: DC

## 2014-04-23 NOTE — Progress Notes (Signed)
Subjective:    Patient ID: Mark [name], male    DOB: June 08, 1946, 68 y.o.   MRN: [id number]  HPI Patient was recently admitted to the hospital with hypoxemia and shortness of breath. Have copied well it portions of his discharge summary and included them below: Admit date: 04/14/2014  Discharge date: 04/16/2014  Time spent: 35 minutes  Recommendations for Outpatient Follow-up:  1. Please follow up on patient's home oxygen needs, he was discharged on supplemental oxygen 2L [place] Discharge Diagnoses:  Principal Problem:  CAP (community acquired pneumonia)  Active Problems:  HTN (hypertension)  Atrial fibrillation  Stage III chronic kidney disease  Acute on chronic diastolic CHF (congestive heart failure)  COPD (chronic obstructive pulmonary disease)  Discharge Condition: Stable/Improved  Diet recommendation: Heart Healthy  Filed Weights    04/14/14 1812  04/15/14 0600  04/16/14 0622   Weight:  64.456 kg (142 lb 1.6 oz)  63.231 kg (139 lb 6.4 oz)  62.415 kg (137 lb 9.6 oz)   History of present illness:  Mark [name] is a 68 y.o. male with PMH of COPD, chronic diastolic CHF, Afib on Xarelto, HTN, CKD 3-4, presents to the ER with the above complaints.  He reports feeling poorly since Thursday, noticed increased dyspnea, cough and weakness for 3-4 days.  No fevers or chills, no wheezing, no orthopnea, no leg swelling  Reports compliance with lasix, has used his inhaler in the last couple of days without much benefit.  Went to [employer] office today where he was noted to be hypoxic and referred to [facility] for evaluation.  In ER, noted to have O2 sats of 74% on RA initially, improved to 95% on 3L, CXR with CAP  Hospital Course:  Patient is a pleasant 68 year old gentleman with a past medical history of chronic obstructive pulmonary disease, history of atrial fibrillation, on chronic anticoagulation, he presented to the emergency room on 04/14/2014 with complaints of shortness of  breath, found to be hypoxemic having an oxygen saturation of 74%. Chest x-ray revealed new increased interstitial density in the left lung consistent with pneumonia. He was started on empiric IV antibiotic therapy with ACE azithromycin and ceftriaxone, admitted to telemetry.  1. Community acquire pneumonia -Patient presenting with hypoxemia with O2 sats 84% on room air in the emergency room with chest x-ray showing new increased interstitial density in the left lung  -Showing clinical improvement on this mornings evaluation  -Will continue empiric IV antibiotic therapy with Rocephin and ceftriaxone  -Blood cultures remained negative  -Will discharge on Levaquin 500 mg PO q 48 hours  2. Acute hypoxemic respiratory failure  -Evidence by an O2 sat of 74% on room air on presentation with respiratory rate of 24  -Likely secondary to community acquired pneumonia, there could be CHF component  -Clinically improved  -Will discharge of home O2  3. History of atrial fibrillation  -Currently in sinus rhythm  -Continue amiodarone 200 mg by mouth daily, metoprolol 75 mg by mouth daily and anticoagulation with Xarelto  4. Hypertension  -Blood pressure stable, last blood pressure reading of 120/65, continue metoprolol 75 mg by mouth daily  5. Possible Acute on Chronic diastolic congestive heart failure  -Last transthoracic echocardiogram performed on 09/23/2011 which showed ejection fraction of 55-60%, labs showing elevated BNP of 4504, weight decreasing from 65 to 63 kg after IV diuresis  -Patient having a net negative fluid balance of 1.2 L  -Continue Lasix  Patient is here today for followup. He feels some  better.  However he still complains of chest congestion and shortness of breath with exertion. On room air he is 93%. With ambulation he quickly drops to 82% on room air after only walking approximately 60 feet. He is wheezing. He has decreased air movement. He also has faint right basilar rales. Patient  states that prior to going to the hospital he had been painting in an attic which was being remodeled.  Believes the heat, the desk, and chemical paint fumes triggered a COPD exacerbation which then led to his community-acquired pneumonia.  Past Medical History  Diagnosis Date  . GERD (gastroesophageal reflux disease)   . Hyperlipidemia   . H/O hiatal hernia   . Hypertension     Sees Dr. Lynnea Ferrier  . AAA (abdominal aortic aneurysm)   . COPD (chronic obstructive pulmonary disease)   . Diastolic heart failure     a.  Echo (08/13/2013): EF 55-60%, normal wall motion, mild MR, mild LAE  . Pneumonia   . Arthritis     "little in my hands" (09/08/2013)  . Chronic kidney disease (CKD), stage III (moderate)   . Atrial fibrillation     a. amiodarone Rx started 08/2013;  b. s/p TEE-DCCV 09/2013 => recurrent AFib => repeat DCCV => NSR;   c. Xarelto 15 QD due to CKD  . Hx of cardiovascular stress test     a. Myoview 09/2012:  no scar or ischemia   Past Surgical History  Procedure Laterality Date  . Knee arthroscopy Right   . Abdominal aortic aneurysm repair  10/15/2012    Procedure: ANEURYSM ABDOMINAL AORTIC REPAIR;  Surgeon: Pryor Ochoa, MD;  Location: [facility] OR;  Service: Vascular;  Laterality: N/A;  Resection and Grafting of Abdominal Aortic Aneurysm - Aortobi-Iliac   . Tee without cardioversion N/A 09/22/2013    Procedure: TRANSESOPHAGEAL ECHOCARDIOGRAM (TEE);  Surgeon: Lars Masson, MD;  Location: [facility] ENDOSCOPY;  Service: Cardiovascular;  Laterality: N/A;  . Cardioversion N/A 09/22/2013    Procedure: CARDIOVERSION;  Surgeon: Lars Masson, MD;  Location: [facility] ENDOSCOPY;  Service: Cardiovascular;  Laterality: N/A;  . Abdominal aortic aneurysm repair  10-15-2012  . Cardioversion  Jan. 12, 2015    Dr. Doylene Canning. Ladona Ridgel   Current Outpatient Prescriptions on File Prior to Visit  Medication Sig Dispense Refill  . acetaminophen (TYLENOL) 325 MG tablet Take 2 tablets (650 mg total) by mouth  every 6 (six) hours as needed for mild pain (or Fever >/= 101).  30 tablet  1  . albuterol (VENTOLIN HFA) 108 (90 BASE) MCG/ACT inhaler Inhale 2 puffs into the lungs every 6 (six) hours as needed for wheezing or shortness of breath.      Marland Kitchen amiodarone (PACERONE) 200 MG tablet Take 1 tablet (200 mg total) by mouth daily.  30 tablet  11  . atorvastatin (LIPITOR) 40 MG tablet Take 40 mg by mouth daily.      . budesonide-formoterol (SYMBICORT) 160-4.5 MCG/ACT inhaler Inhale 2 puffs into the lungs 2 (two) times daily.  1 Inhaler  11  . diltiazem (CARDIZEM CD) 360 MG 24 hr capsule Take 360 mg by mouth daily.       Marland Kitchen esomeprazole (NEXIUM) 40 MG capsule Take 40 mg by mouth daily at 12 noon.      . furosemide (LASIX) 40 MG tablet Take 40 mg by mouth daily.      Marland Kitchen levofloxacin (LEVAQUIN) 500 MG tablet Take 1 tablet (500 mg total) by mouth every other day.  3 tablet  0  . linagliptin (TRADJENTA) 5 MG TABS tablet Take 1 tablet (5 mg total) by mouth daily.  30 tablet  11  . metoprolol succinate (TOPROL-XL) 25 MG 24 hr tablet Take 75 mg by mouth daily.      . sodium chloride (OCEAN) 0.65 % SOLN nasal spray Place 1 spray into both nostrils as needed for congestion.      Marland Kitchen. tiotropium (SPIRIVA HANDIHALER) 18 MCG inhalation capsule Place 1 capsule (18 mcg total) into inhaler and inhale daily.  30 capsule  11   No current facility-administered medications on file prior to visit.   Allergies  Allergen Reactions  . Niaspan [Niacin Er] Anaphylaxis   History   Social History  . Marital Status: Married    Spouse Name: [name]    Number of Children: 4  . Years of Education: N/A   Occupational History  . Works PT as a Education administratorpainter    Social History Main Topics  . Smoking status: Former Smoker -- 1.00 packs/day for 56 years    Types: Cigarettes    Quit date: 07/31/2013  . Smokeless tobacco: Former NeurosurgeonUser    Types: Chew     Comment: 09/08/2013 "stopped smoking cigarettes ~ 2 months ago; aien't chew in probably 10  yrs"  . Alcohol Use: No  . Drug Use: No  . Sexual Activity: Not Currently   Other Topics Concern  . Not on file   Social History Narrative   Married.  Lives with wife.  Ambulates without assistance.    Review of Systems  All other systems reviewed and are negative.      Objective:   Physical Exam  Vitals reviewed. Constitutional: He appears well-developed and well-nourished. No distress.  Neck: Neck supple. No JVD present.  Cardiovascular: Normal rate, regular rhythm and normal heart sounds.   Pulmonary/Chest: Effort normal. He has wheezes. He has rales.  Abdominal: Soft. Bowel sounds are normal. He exhibits no distension. There is no tenderness. There is no rebound and no guarding.  Musculoskeletal: He exhibits no edema.  Lymphadenopathy:    He has no cervical adenopathy.  Skin: He is not diaphoretic.          Assessment & Plan:  COPD exacerbation - Plan: predniSONE (DELTASONE) 20 MG tablet  Hospital discharge follow-up   A balloon the patient's pneumonia is improving and she is afebrile. However he is still hypoxic with ambulation and I believe this is most likely due to COPD exacerbation. Therefore, I will start the patient on a prednisone taper pack. He has responded quickly to this in the past.  I also recommended he begin Mucinex 1200 mg by mouth twice a day. Also recommended he sleep next humidifier to help thin the mucus out so  that he can cough it up more effectively.  Recheck next week.

## 2014-04-30 ENCOUNTER — Encounter: Payer: Self-pay | Admitting: Family Medicine

## 2014-04-30 ENCOUNTER — Ambulatory Visit (INDEPENDENT_AMBULATORY_CARE_PROVIDER_SITE_OTHER): Payer: Medicare Other | Admitting: Family Medicine

## 2014-04-30 VITALS — BP 146/68 | HR 66 | Temp 98.0°F | Resp 22 | Ht 66.5 in | Wt 144.0 lb

## 2014-04-30 DIAGNOSIS — J441 Chronic obstructive pulmonary disease with (acute) exacerbation: Secondary | ICD-10-CM

## 2014-04-30 NOTE — Progress Notes (Signed)
Subjective:    Patient ID: Mark [name], male    DOB: [birth year], 68 y.o.   MRN: [id number]  HPI 04/23/14 Patient was recently admitted to the hospital with hypoxemia and shortness of breath. Have copied well it portions of his discharge summary and included them below: Admit date: 04/14/2014  Discharge date: 04/16/2014  Time spent: 35 minutes  Recommendations for Outpatient Follow-up:  1. Please follow up on patient's home oxygen needs, he was discharged on supplemental oxygen 2L [place] Discharge Diagnoses:  Principal Problem:  CAP (community acquired pneumonia)  Active Problems:  HTN (hypertension)  Atrial fibrillation  Stage III chronic kidney disease  Acute on chronic diastolic CHF (congestive heart failure)  COPD (chronic obstructive pulmonary disease)  Discharge Condition: Stable/Improved  Diet recommendation: Heart Healthy  Filed Weights    04/14/14 1812  04/15/14 0600  04/16/14 0622   Weight:  64.456 kg (142 lb 1.6 oz)  63.231 kg (139 lb 6.4 oz)  62.415 kg (137 lb 9.6 oz)   History of present illness:  Mark [name] is a 68 y.o. male with PMH of COPD, chronic diastolic CHF, Afib on Xarelto, HTN, CKD 3-4, presents to the ER with the above complaints.  He reports feeling poorly since Thursday, noticed increased dyspnea, cough and weakness for 3-4 days.  No fevers or chills, no wheezing, no orthopnea, no leg swelling  Reports compliance with lasix, has used his inhaler in the last couple of days without much benefit.  Went to [employer] office today where he was noted to be hypoxic and referred to [facility] [employer] for evaluation.  In ER, noted to have O2 sats of 74% on RA initially, improved to 95% on 3L, CXR with CAP  Hospital Course:  Patient is a pleasant 69 year old gentleman with a past medical history of chronic obstructive pulmonary disease, history of atrial fibrillation, on chronic anticoagulation, he presented to the emergency room on 04/14/2014 with complaints of shortness  of breath, found to be hypoxemic having an oxygen saturation of 74%. Chest x-ray revealed new increased interstitial density in the left lung consistent with pneumonia. He was started on empiric IV antibiotic therapy with ACE azithromycin and ceftriaxone, admitted to telemetry.  1. Community acquire pneumonia -Patient presenting with hypoxemia with O2 sats 84% on room air in the emergency room with chest x-ray showing new increased interstitial density in the left lung  -Showing clinical improvement on this mornings evaluation  -Will continue empiric IV antibiotic therapy with Rocephin and ceftriaxone  -Blood cultures remained negative  -Will discharge on Levaquin 500 mg PO q 48 hours  2. Acute hypoxemic respiratory failure  -Evidence by an O2 sat of 74% on room air on presentation with respiratory rate of 24  -Likely secondary to community acquired pneumonia, there could be CHF component  -Clinically improved  -Will discharge of home O2  3. History of atrial fibrillation  -Currently in sinus rhythm  -Continue amiodarone 200 mg by mouth daily, metoprolol 75 mg by mouth daily and anticoagulation with Xarelto  4. Hypertension  -Blood pressure stable, last blood pressure reading of 120/65, continue metoprolol 75 mg by mouth daily  5. Possible Acute on Chronic diastolic congestive heart failure  -Last transthoracic echocardiogram performed on 09/23/2011 which showed ejection fraction of 55-60%, labs showing elevated BNP of 4504, weight decreasing from 65 to 63 kg after IV diuresis  -Patient having a net negative fluid balance of 1.2 L  -Continue Lasix 04/23/14 Patient is here today for followup. He feels  some better.  However he still complains of chest congestion and shortness of breath with exertion. On room air he is 93%. With ambulation he quickly drops to 82% on room air after only walking approximately 60 feet. He is wheezing. He has decreased air movement. He also has faint right basilar  rales. Patient states that prior to going to the hospital he had been painting in an attic which was being remodeled.  Believes the heat, the desk, and chemical paint fumes triggered a COPD exacerbation which then led to his community-acquired pneumonia.  At that time, my plan was: I feel the patient's pneumonia is improving and he is afebrile. However he is still hypoxic with ambulation and I believe this is most likely due to COPD exacerbation. Therefore, I will start the patient on a prednisone taper pack. He has responded quickly to this in the past.  I also recommended he begin Mucinex 1200 mg by mouth twice a day. Also recommended he sleep next to a humidifier to help thin the mucus out so  that he can cough it up more effectively.  Recheck next week. 04/30/14 Patient is here for recheck.  He is feeling much better. He is 99% on room air. However with ambulation on room air his oxygen quickly drops to 84%. He is feeling much better but he still has a cough productive of white mucus. He continues to take Mucinex. He continue to use albuterol 2 puffs inhaled every 6 hours..  he is approximately 50% better.  Past Medical History  Diagnosis Date  . GERD (gastroesophageal reflux disease)   . Hyperlipidemia   . H/O hiatal hernia   . Hypertension     Sees Dr. Lynnea Ferrier  . AAA (abdominal aortic aneurysm)   . COPD (chronic obstructive pulmonary disease)   . Diastolic heart failure     a.  Echo (08/13/2013): EF 55-60%, normal wall motion, mild MR, mild LAE  . Pneumonia   . Arthritis     "little in my hands" (09/08/2013)  . Chronic kidney disease (CKD), stage III (moderate)   . Atrial fibrillation     a. amiodarone Rx started 08/2013;  b. s/p TEE-DCCV 09/2013 => recurrent AFib => repeat DCCV => NSR;   c. Xarelto 15 QD due to CKD  . Hx of cardiovascular stress test     a. Myoview 09/2012:  no scar or ischemia   Past Surgical History  Procedure Laterality Date  . Knee arthroscopy Right   .  Abdominal aortic aneurysm repair  10/15/2012    Procedure: ANEURYSM ABDOMINAL AORTIC REPAIR;  Surgeon: Pryor Ochoa, MD;  Location: [facility] OR;  Service: Vascular;  Laterality: N/A;  Resection and Grafting of Abdominal Aortic Aneurysm - Aortobi-Iliac   . Tee without cardioversion N/A 09/22/2013    Procedure: TRANSESOPHAGEAL ECHOCARDIOGRAM (TEE);  Surgeon: Lars Masson, MD;  Location: [facility] ENDOSCOPY;  Service: Cardiovascular;  Laterality: N/A;  . Cardioversion N/A 09/22/2013    Procedure: CARDIOVERSION;  Surgeon: Lars Masson, MD;  Location: [facility] ENDOSCOPY;  Service: Cardiovascular;  Laterality: N/A;  . Abdominal aortic aneurysm repair  10-15-2012  . Cardioversion  Jan. 12, 2015    Dr. Doylene Canning. Ladona Ridgel   Current Outpatient Prescriptions on File Prior to Visit  Medication Sig Dispense Refill  . acetaminophen (TYLENOL) 325 MG tablet Take 2 tablets (650 mg total) by mouth every 6 (six) hours as needed for mild pain (or Fever >/= 101).  30 tablet  1  . albuterol (  VENTOLIN HFA) 108 (90 BASE) MCG/ACT inhaler Inhale 2 puffs into the lungs every 6 (six) hours as needed for wheezing or shortness of breath.      Marland Kitchen. amiodarone (PACERONE) 200 MG tablet Take 1 tablet (200 mg total) by mouth daily.  30 tablet  11  . atorvastatin (LIPITOR) 40 MG tablet Take 40 mg by mouth daily.      . budesonide-formoterol (SYMBICORT) 160-4.5 MCG/ACT inhaler Inhale 2 puffs into the lungs 2 (two) times daily.  1 Inhaler  11  . diltiazem (CARDIZEM CD) 360 MG 24 hr capsule Take 360 mg by mouth daily.       Marland Kitchen. esomeprazole (NEXIUM) 40 MG capsule Take 40 mg by mouth daily at 12 noon.      . furosemide (LASIX) 40 MG tablet Take 40 mg by mouth daily.      Marland Kitchen. levofloxacin (LEVAQUIN) 500 MG tablet Take 1 tablet (500 mg total) by mouth every other day.  3 tablet  0  . linagliptin (TRADJENTA) 5 MG TABS tablet Take 1 tablet (5 mg total) by mouth daily.  30 tablet  11  . metoprolol succinate (TOPROL-XL) 25 MG 24 hr tablet Take 75 mg by mouth  daily.      . predniSONE (DELTASONE) 20 MG tablet 3 tabs poqday 1-2, 2 tabs poqday 3-4, 1 tab poqday 5-6  12 tablet  0  . Rivaroxaban (XARELTO) 15 MG TABS tablet Take 1 tablet (15 mg total) by mouth daily.  30 tablet  11  . sodium chloride (OCEAN) 0.65 % SOLN nasal spray Place 1 spray into both nostrils as needed for congestion.      Marland Kitchen. tiotropium (SPIRIVA HANDIHALER) 18 MCG inhalation capsule Place 1 capsule (18 mcg total) into inhaler and inhale daily.  30 capsule  11   No current facility-administered medications on file prior to visit.   Allergies  Allergen Reactions  . Niaspan [Niacin Er] Anaphylaxis   History   Social History  . Marital Status: Married    Spouse Name: [name]    Number of Children: 4  . Years of Education: N/A   Occupational History  . Works PT as a Education administratorpainter    Social History Main Topics  . Smoking status: Former Smoker -- 1.00 packs/day for 56 years    Types: Cigarettes    Quit date: 07/31/2013  . Smokeless tobacco: Former NeurosurgeonUser    Types: Chew     Comment: 09/08/2013 "stopped smoking cigarettes ~ 2 months ago; aien't chew in probably 10 yrs"  . Alcohol Use: No  . Drug Use: No  . Sexual Activity: Not Currently   Other Topics Concern  . Not on file   Social History Narrative   Married.  Lives with wife.  Ambulates without assistance.    Review of Systems  All other systems reviewed and are negative.      Objective:   Physical Exam  Vitals reviewed. Constitutional: He appears well-developed and well-nourished. No distress.  Neck: Neck supple. No JVD present.  Cardiovascular: Normal rate, regular rhythm and normal heart sounds.   Pulmonary/Chest: Effort normal. He has no wheezes. He has no rales.  Abdominal: Soft. Bowel sounds are normal. He exhibits no distension. There is no tenderness. There is no rebound and no guarding.  Musculoskeletal: He exhibits no edema.  Lymphadenopathy:    He has no cervical adenopathy.  Skin: He is not  diaphoretic.          Assessment & Plan:  1. COPD  exacerbation Patient is slowly improving. I do not feel he needs further antibiotics or steroids. I recommended he continue to use albuterol 2 puffs inhaled every 4-6 hours as needed. I recommend he continue to use oxygen with ambulation. I recommend he continue to use Mucinex. I like to check him in 2 weeks and I believe at that time he was back to his baseline.

## 2014-05-14 ENCOUNTER — Ambulatory Visit (INDEPENDENT_AMBULATORY_CARE_PROVIDER_SITE_OTHER): Payer: Medicare Other | Admitting: Family Medicine

## 2014-05-14 ENCOUNTER — Encounter: Payer: Self-pay | Admitting: Family Medicine

## 2014-05-14 VITALS — BP 140/78 | HR 68 | Temp 97.8°F | Resp 16 | Ht 66.5 in | Wt 145.0 lb

## 2014-05-14 DIAGNOSIS — I480 Paroxysmal atrial fibrillation: Secondary | ICD-10-CM

## 2014-05-14 DIAGNOSIS — J441 Chronic obstructive pulmonary disease with (acute) exacerbation: Secondary | ICD-10-CM

## 2014-05-14 DIAGNOSIS — E119 Type 2 diabetes mellitus without complications: Secondary | ICD-10-CM

## 2014-05-14 DIAGNOSIS — I4891 Unspecified atrial fibrillation: Secondary | ICD-10-CM

## 2014-05-14 LAB — COMPLETE METABOLIC PANEL WITH GFR
ALBUMIN: 3.5 g/dL (ref 3.5–5.2)
ALT: 30 U/L (ref 0–53)
AST: 21 U/L (ref 0–37)
Alkaline Phosphatase: 77 U/L (ref 39–117)
BUN: 25 mg/dL — AB (ref 6–23)
CALCIUM: 9 mg/dL (ref 8.4–10.5)
CHLORIDE: 102 meq/L (ref 96–112)
CO2: 28 meq/L (ref 19–32)
Creat: 2.59 mg/dL — ABNORMAL HIGH (ref 0.50–1.35)
GFR, EST AFRICAN AMERICAN: 28 mL/min — AB
GFR, EST NON AFRICAN AMERICAN: 24 mL/min — AB
GLUCOSE: 182 mg/dL — AB (ref 70–99)
POTASSIUM: 5.1 meq/L (ref 3.5–5.3)
SODIUM: 138 meq/L (ref 135–145)
TOTAL PROTEIN: 6.2 g/dL (ref 6.0–8.3)
Total Bilirubin: 0.3 mg/dL (ref 0.2–1.2)

## 2014-05-14 LAB — HEMOGLOBIN A1C
HEMOGLOBIN A1C: 6.8 % — AB (ref <5.7)
Mean Plasma Glucose: 148 mg/dL — ABNORMAL HIGH (ref <117)

## 2014-05-14 NOTE — Progress Notes (Signed)
Subjective:    Patient ID: Mark [name], male    DOB: [birth year], 68 y.o.   MRN: [id number]  HPI 04/23/14 Patient was recently admitted to the hospital with hypoxemia and shortness of breath. Have copied well it portions of his discharge summary and included them below: Admit date: 04/14/2014  Discharge date: 04/16/2014  Time spent: 35 minutes  Recommendations for Outpatient Follow-up:  1. Please follow up on patient's home oxygen needs, he was discharged on supplemental oxygen 2L [place] Discharge Diagnoses:  Principal Problem:  CAP (community acquired pneumonia)  Active Problems:  HTN (hypertension)  Atrial fibrillation  Stage III chronic kidney disease  Acute on chronic diastolic CHF (congestive heart failure)  COPD (chronic obstructive pulmonary disease)  Discharge Condition: Stable/Improved  Diet recommendation: Heart Healthy  Filed Weights    04/14/14 1812  04/15/14 0600  04/16/14 0622   Weight:  64.456 kg (142 lb 1.6 oz)  63.231 kg (139 lb 6.4 oz)  62.415 kg (137 lb 9.6 oz)   History of present illness:  Mark [name] is a 68 y.o. male with PMH of COPD, chronic diastolic CHF, Afib on Xarelto, HTN, CKD 3-4, presents to the ER with the above complaints.  He reports feeling poorly since Thursday, noticed increased dyspnea, cough and weakness for 3-4 days.  No fevers or chills, no wheezing, no orthopnea, no leg swelling  Reports compliance with lasix, has used his inhaler in the last couple of days without much benefit.  Went to [employer] office today where he was noted to be hypoxic and referred to [employer] for evaluation.  In ER, noted to have O2 sats of 74% on RA initially, improved to 95% on 3L, CXR with CAP  Hospital Course:  Patient is a pleasant 69 year old gentleman with a past medical history of chronic obstructive pulmonary disease, history of atrial fibrillation, on chronic anticoagulation, he presented to the emergency room on 04/14/2014 with complaints of shortness  of breath, found to be hypoxemic having an oxygen saturation of 74%. Chest x-ray revealed new increased interstitial density in the left lung consistent with pneumonia. He was started on empiric IV antibiotic therapy with ACE azithromycin and ceftriaxone, admitted to telemetry.  1. Community acquire pneumonia -Patient presenting with hypoxemia with O2 sats 84% on room air in the emergency room with chest x-ray showing new increased interstitial density in the left lung  -Showing clinical improvement on this mornings evaluation  -Will continue empiric IV antibiotic therapy with Rocephin and ceftriaxone  -Blood cultures remained negative  -Will discharge on Levaquin 500 mg PO q 48 hours  2. Acute hypoxemic respiratory failure  -Evidence by an O2 sat of 74% on room air on presentation with respiratory rate of 24  -Likely secondary to community acquired pneumonia, there could be CHF component  -Clinically improved  -Will discharge of home O2  3. History of atrial fibrillation  -Currently in sinus rhythm  -Continue amiodarone 200 mg by mouth daily, metoprolol 75 mg by mouth daily and anticoagulation with Xarelto  4. Hypertension  -Blood pressure stable, last blood pressure reading of 120/65, continue metoprolol 75 mg by mouth daily  5. Possible Acute on Chronic diastolic congestive heart failure  -Last transthoracic echocardiogram performed on 09/23/2011 which showed ejection fraction of 55-60%, labs showing elevated BNP of 4504, weight decreasing from 65 to 63 kg after IV diuresis  -Patient having a net negative fluid balance of 1.2 L  -Continue Lasix 04/23/14 Patient is here today for followup. He feels  some better.  However he still complains of chest congestion and shortness of breath with exertion. On room air he is 93%. With ambulation he quickly drops to 82% on room air after only walking approximately 60 feet. He is wheezing. He has decreased air movement. He also has faint right basilar  rales. Patient states that prior to going to the hospital he had been painting in an attic which was being remodeled.  Believes the heat, the desk, and chemical paint fumes triggered a COPD exacerbation which then led to his community-acquired pneumonia.  At that time, my plan was: I feel the patient's pneumonia is improving and he is afebrile. However he is still hypoxic with ambulation and I believe this is most likely due to COPD exacerbation. Therefore, I will start the patient on a prednisone taper pack. He has responded quickly to this in the past.  I also recommended he begin Mucinex 1200 mg by mouth twice a day. Also recommended he sleep next to a humidifier to help thin the mucus out so  that he can cough it up more effectively.  Recheck next week. 04/30/14 Patient is here for recheck.  He is feeling much better. He is 99% on room air. However with ambulation on room air his oxygen quickly drops to 84%. He is feeling much better but he still has a cough productive of white mucus. He continues to take Mucinex. He continue to use albuterol 2 puffs inhaled every 6 hours..  he is approximately 50% better.  At that time, my plan was: 1. COPD exacerbation Patient is slowly improving. I do not feel he needs further antibiotics or steroids. I recommended he continue to use albuterol 2 puffs inhaled every 4-6 hours as needed. I recommend he continue to use oxygen with ambulation. I recommend he continue to use Mucinex. I like to check him in 2 weeks and I believe at that time he was back to his baseline.  05/14/14 Patient is here today for followup. He states that he is close to normal.  He continues to have dyspnea on exertion. Today, his pulse oximetry dropped to 88% with ambulation greater than 60 feet.  Although better than last time, the patient still requires oxygen with ambulation. However at rest he is stable at 96% on room air. Ambulation on 2 L, the patient is stable at 96%. I believe his COPD is  back to his baseline. I explained in great detail that he has permanent lung damage that whenever they could. At times with exacerbations that make it worse. Probably his most recent exacerbation now has completely resolved but he still requires oxygen and his inhalers for his chronic COPD. The patient is also due for a hemoglobin A1c to monitor his diabetes. It was last checked in January. He also is concerned about his blood thinner. He is questioning if he still needs it. Patient has been in normal sinus rhythm at every office visit dense initial diagnosis. I do believe that the strain from his infection and COPD triggered his paroxysmal atrial fibrillation. However I stated the patient can not stop his back pain without documented proof that he is not having paroxysmal atrial fibrillation.    Past Medical History  Diagnosis Date  . GERD (gastroesophageal reflux disease)   . Hyperlipidemia   . H/O hiatal hernia   . Hypertension     Sees Dr. Lynnea Ferrier  . AAA (abdominal aortic aneurysm)   . COPD (chronic obstructive pulmonary disease)   .  Diastolic heart failure     a.  Echo (08/13/2013): EF 55-60%, normal wall motion, mild MR, mild LAE  . Pneumonia   . Arthritis     "little in my hands" (09/08/2013)  . Chronic kidney disease (CKD), stage III (moderate)   . Atrial fibrillation     a. amiodarone Rx started 08/2013;  b. s/p TEE-DCCV 09/2013 => recurrent AFib => repeat DCCV => NSR;   c. Xarelto 15 QD due to CKD  . Hx of cardiovascular stress test     a. Myoview 09/2012:  no scar or ischemia   Past Surgical History  Procedure Laterality Date  . Knee arthroscopy Right   . Abdominal aortic aneurysm repair  10/15/2012    Procedure: ANEURYSM ABDOMINAL AORTIC REPAIR;  Surgeon: Pryor Ochoa, MD;  Location: [facility] OR;  Service: Vascular;  Laterality: N/A;  Resection and Grafting of Abdominal Aortic Aneurysm - Aortobi-Iliac   . Tee without cardioversion N/A 09/22/2013    Procedure: TRANSESOPHAGEAL  ECHOCARDIOGRAM (TEE);  Surgeon: Lars Masson, MD;  Location: [facility] ENDOSCOPY;  Service: Cardiovascular;  Laterality: N/A;  . Cardioversion N/A 09/22/2013    Procedure: CARDIOVERSION;  Surgeon: Lars Masson, MD;  Location: [facility] ENDOSCOPY;  Service: Cardiovascular;  Laterality: N/A;  . Abdominal aortic aneurysm repair  10-15-2012  . Cardioversion  Jan. 12, 2015    Dr. Doylene Canning. Ladona Ridgel   Current Outpatient Prescriptions on File Prior to Visit  Medication Sig Dispense Refill  . acetaminophen (TYLENOL) 325 MG tablet Take 2 tablets (650 mg total) by mouth every 6 (six) hours as needed for mild pain (or Fever >/= 101).  30 tablet  1  . albuterol (VENTOLIN HFA) 108 (90 BASE) MCG/ACT inhaler Inhale 2 puffs into the lungs every 6 (six) hours as needed for wheezing or shortness of breath.      Marland Kitchen amiodarone (PACERONE) 200 MG tablet Take 1 tablet (200 mg total) by mouth daily.  30 tablet  11  . atorvastatin (LIPITOR) 40 MG tablet Take 40 mg by mouth daily.      . budesonide-formoterol (SYMBICORT) 160-4.5 MCG/ACT inhaler Inhale 2 puffs into the lungs 2 (two) times daily.  1 Inhaler  11  . diltiazem (CARDIZEM CD) 360 MG 24 hr capsule Take 360 mg by mouth daily.       Marland Kitchen esomeprazole (NEXIUM) 40 MG capsule Take 40 mg by mouth daily at 12 noon.      . furosemide (LASIX) 40 MG tablet Take 40 mg by mouth daily.      Marland Kitchen linagliptin (TRADJENTA) 5 MG TABS tablet Take 1 tablet (5 mg total) by mouth daily.  30 tablet  11  . metoprolol succinate (TOPROL-XL) 25 MG 24 hr tablet Take 75 mg by mouth daily.      . Rivaroxaban (XARELTO) 15 MG TABS tablet Take 1 tablet (15 mg total) by mouth daily.  30 tablet  11  . sodium chloride (OCEAN) 0.65 % SOLN nasal spray Place 1 spray into both nostrils as needed for congestion.      Marland Kitchen tiotropium (SPIRIVA HANDIHALER) 18 MCG inhalation capsule Place 1 capsule (18 mcg total) into inhaler and inhale daily.  30 capsule  11   No current facility-administered medications on file prior to  visit.   Allergies  Allergen Reactions  . Niaspan [Niacin Er] Anaphylaxis   History   Social History  . Marital Status: Married    Spouse Name: Nicole Cella    Number of Children: 4  . Years  of Education: N/A   Occupational History  . Works PT as a Education administrator    Social History Main Topics  . Smoking status: Former Smoker -- 1.00 packs/day for 56 years    Types: Cigarettes    Quit date: 07/31/2013  . Smokeless tobacco: Former Neurosurgeon    Types: Chew     Comment: 09/08/2013 "stopped smoking cigarettes ~ 2 months ago; aien't chew in probably 10 yrs"  . Alcohol Use: No  . Drug Use: No  . Sexual Activity: Not Currently   Other Topics Concern  . Not on file   Social History Narrative   Married.  Lives with wife.  Ambulates without assistance.    Review of Systems  All other systems reviewed and are negative.      Objective:   Physical Exam  Vitals reviewed. Constitutional: He appears well-developed and well-nourished. No distress.  Neck: Neck supple. No JVD present.  Cardiovascular: Normal rate, regular rhythm and normal heart sounds.   Pulmonary/Chest: Effort normal. He has no wheezes. He has no rales.  Abdominal: Soft. Bowel sounds are normal. He exhibits no distension. There is no tenderness. There is no rebound and no guarding.  Musculoskeletal: He exhibits no edema.  Lymphadenopathy:    He has no cervical adenopathy.  Skin: He is not diaphoretic.          Assessment & Plan:  Type II or unspecified type diabetes mellitus without mention of complication, not stated as uncontrolled - Plan: COMPLETE METABOLIC PANEL WITH GFR, Hemoglobin A1c  COPD exacerbation  Paroxysmal atrial fibrillation - Plan: Ambulatory referral to Cardiology   COPD is back to baseline. There is no current exacerbation. Continue his inhalers. Also recommended he continue oxygen with ambulation. Recheck hemoglobin A1c today to ensure that his diabetes is well controlled. I scheduled the patient  for cardiology. I explained to the patient they will likely have the patient one-month loop recorder to make sure that he is not experiencing a fibrillation. If there is no documented evidence of major fibrillation, he may be able to discontinue xarelto 11 aspirin. However I stated the knee through the cardiologist first.

## 2014-05-17 ENCOUNTER — Encounter: Payer: Self-pay | Admitting: *Deleted

## 2014-05-19 ENCOUNTER — Encounter: Payer: Self-pay | Admitting: Cardiology

## 2014-05-19 ENCOUNTER — Ambulatory Visit (INDEPENDENT_AMBULATORY_CARE_PROVIDER_SITE_OTHER): Payer: Medicare Other | Admitting: Cardiology

## 2014-05-19 VITALS — BP 136/62 | HR 58 | Ht 66.5 in | Wt 143.0 lb

## 2014-05-19 DIAGNOSIS — N183 Chronic kidney disease, stage 3 unspecified: Secondary | ICD-10-CM

## 2014-05-19 DIAGNOSIS — Z79899 Other long term (current) drug therapy: Secondary | ICD-10-CM

## 2014-05-19 DIAGNOSIS — I5032 Chronic diastolic (congestive) heart failure: Secondary | ICD-10-CM

## 2014-05-19 DIAGNOSIS — I480 Paroxysmal atrial fibrillation: Secondary | ICD-10-CM

## 2014-05-19 DIAGNOSIS — I4891 Unspecified atrial fibrillation: Secondary | ICD-10-CM

## 2014-05-19 NOTE — Progress Notes (Signed)
Mark [name] Date of Birth:  05/21/1946 [facility] [address] [place], [place]  [address] [phone]        Fax   [phone]   History of Present Illness: Mark [name] is a 68 y.o. male with the history of HTN, HL, CKD, AAA s/p repair in 09/2012, Myoview in 09/2012 normal.  Admitted in 07/2013 for AECOPD c/b AFib with RVR.  He was placed on Coumadin and rate control Rx. Plan was to proceed with DCCV should he remain in atrial fibrillation. Echo demonstrated normal LVF.  Readmitted 08/2013 with a/c diastolic CHF c/b AKI on CKD. Coumadin was switched to Xarelto. He was also placed on amiodarone. He was discharged with plans to proceed with DCCV in approximately 3 weeks. He was discharged off of Lasix due to worsening renal function.  Readmitted 09/2013 with a/c diastolic CHF and persistent atrial fibrillation. He again had worsening renal function with diuresis. Lasix had to be held. He underwent TEE guided DCCV 09/22/2013. However, he had recurrent atrial fibrillation with RVR. Amiodarone dose was adjusted. He underwent repeat cardioversion by Dr. Ladona Ridgel on 09/28/13. He has remained in normal sinus rhythm since his second cardioversion.  He was again not sent home on Lasix due to worsening renal function.  After discharge, his Lasix was resumed due to worsening edema.  At the present time he is on Lasix 40 mg daily and appears to be tolerating it well.  He has not had any significant peripheral edema.  He does have home oxygen which he uses on an as needed basis. He has not been experiencing any palpitations or tachycardia.  He's had no dizziness or syncope.  He had no chest pain.   Current Outpatient Prescriptions  Medication Sig Dispense Refill  . acetaminophen (TYLENOL) 325 MG tablet Take 2 tablets (650 mg total) by mouth every 6 (six) hours as needed for mild pain (or Fever >/= 101).  30 tablet  1  . albuterol (VENTOLIN HFA) 108 (90 BASE) MCG/ACT  inhaler Inhale 2 puffs into the lungs every 6 (six) hours as needed for wheezing or shortness of breath.      Marland Kitchen amiodarone (PACERONE) 200 MG tablet Take 1 tablet (200 mg total) by mouth daily.  30 tablet  11  . atorvastatin (LIPITOR) 40 MG tablet Take 40 mg by mouth daily.      . budesonide-formoterol (SYMBICORT) 160-4.5 MCG/ACT inhaler Inhale 2 puffs into the lungs 2 (two) times daily.  1 Inhaler  11  . diltiazem (CARDIZEM CD) 360 MG 24 hr capsule Take 360 mg by mouth daily.       Marland Kitchen esomeprazole (NEXIUM) 40 MG capsule Take 40 mg by mouth daily at 12 noon.      . furosemide (LASIX) 40 MG tablet Take 40 mg by mouth daily.      Marland Kitchen linagliptin (TRADJENTA) 5 MG TABS tablet Take 1 tablet (5 mg total) by mouth daily.  30 tablet  11  . metoprolol succinate (TOPROL-XL) 25 MG 24 hr tablet Take 75 mg by mouth daily.      . Rivaroxaban (XARELTO) 15 MG TABS tablet Take 1 tablet (15 mg total) by mouth daily.  30 tablet  11  . sodium chloride (OCEAN) 0.65 % SOLN nasal spray Place 1 spray into both nostrils as needed for congestion.      Marland Kitchen tiotropium (SPIRIVA HANDIHALER) 18 MCG inhalation capsule Place 1 capsule (18 mcg total) into inhaler and inhale daily.  30 capsule  11   No current facility-administered medications for this visit.    Allergies  Allergen Reactions  . Niaspan [Niacin Er] Anaphylaxis    Patient Active Problem List   Diagnosis Date Noted  . Acute respiratory failure 04/14/2014  . Aftercare following surgery of the circulatory system, NEC 12/29/2013  . CHF (congestive heart failure) 10/13/2013  . Chronic diastolic heart failure 10/06/2013  . Protein calorie malnutrition 10/06/2013  . COPD (chronic obstructive pulmonary disease) 09/19/2013  . Acute diastolic heart failure 09/19/2013  . Respiratory failure 09/19/2013  . Chronic kidney disease (CKD), stage III (moderate) 09/19/2013  . Stage III chronic kidney disease 09/08/2013  . Acute on chronic diastolic CHF (congestive heart  failure) 09/08/2013  . Acute-on-chronic respiratory failure 09/08/2013  . Long term (current) use of anticoagulants 08/19/2013  . Hyperkalemia 08/14/2013  . Atrial fibrillation 08/13/2013  . COPD with exacerbation 08/11/2013  . CAP (community acquired pneumonia) 08/11/2013  . Tobacco abuse 08/11/2013  . COPD exacerbation 08/11/2013  . Chronic kidney disease   . HTN (hypertension) 01/07/2013  . HLD (hyperlipidemia) 01/07/2013  . AAA (abdominal aortic aneurysm) without rupture 12/30/2012  . AAA (abdominal aortic aneurysm) 10/13/2012  . Abdominal aneurysm without mention of rupture 10/07/2012  . Atherosclerosis of native arteries of the extremities with intermittent claudication 10/07/2012    History  Smoking status  . Former Smoker -- 1.00 packs/day for 56 years  . Types: Cigarettes  . Quit date: 07/31/2013  Smokeless tobacco  . Former Neurosurgeon  . Types: Chew    Comment: 09/08/2013 "stopped smoking cigarettes ~ 2 months ago; aien't chew in probably 10 yrs"    History  Alcohol Use No    Family History  Problem Relation Age of Onset  . Diabetes Mother   . Tuberculosis Mother   . Heart disease Father   . Heart attack Father   . Heart disease Son     Blood clot:  Arm    Review of Systems: Constitutional: no fever chills diaphoresis or fatigue or change in weight.  Head and neck: no hearing loss, no epistaxis, no photophobia or visual disturbance. Respiratory: No cough, shortness of breath or wheezing.  On home oxygen Cardiovascular: No chest pain peripheral edema, palpitations. Gastrointestinal: No abdominal distention, no abdominal pain, no change in bowel habits hematochezia or melena. Genitourinary: No dysuria, no frequency, no urgency, no nocturia. Musculoskeletal:No arthralgias, no back pain, no gait disturbance or myalgias. Neurological: No dizziness, no headaches, no numbness, no seizures, no syncope, no weakness, no tremors. Hematologic: No lymphadenopathy, no easy  bruising. Psychiatric: No confusion, no hallucinations, no sleep disturbance.    Physical Exam: Filed Vitals:   05/19/14 1522  BP: 136/62  Pulse: 58   general appearance reveals a well-developed well-nourished middle-aged gentleman in no distress.  Nasal oxygen in place. The patient appears to be in no distress.  Head and neck exam reveals that the pupils are equal and reactive.  The extraocular movements are full.  There is no scleral icterus.  Mouth and pharynx are benign.  No lymphadenopathy.  No carotid bruits.  The jugular venous pressure is normal.  Thyroid is not enlarged or tender.  Chest is clear to percussion and auscultation.  No rales or rhonchi.  Expansion of the chest is symmetrical.  Breath sounds are distant consistent with COPD  Heart reveals no abnormal lift or heave.  First and second heart sounds are normal.  There is no murmur gallop rub or click.  The abdomen  is soft and nontender.  Bowel sounds are normoactive.  There is no hepatosplenomegaly or mass.  There are no abdominal bruits.  Extremities reveal no phlebitis or edema.  Pedal pulses are good.  There is no cyanosis or clubbing.  Neurologic exam is normal strength and no lateralizing weakness.  No sensory deficits.  Integument reveals no rash  EKG today shows sinus bradycardia and no ischemic changes.  Assessment / Plan: 1. paroxysmal atrial fibrillation maintaining normal sinus rhythm on amiodarone.  On long-term Xarelto for chadssvasc score of 5(age, hypertension, vascular disease, diabetes, congestive heart failure) 2. chronic diastolic heart failure currently compensated on Lasix 40 mg daily 3. Hypercholesterolemia 4. diabetes mellitus 5. chronic kidney disease 6. COPD  Disposition: Continue same medication.  We are checking thyroid function today to follow him on his amiodarone.  Recheck in 6 months for office visit and EKG

## 2014-05-19 NOTE — Patient Instructions (Signed)
Will obtain labs today and call you with the results (tsh)  Your physician recommends that you continue on your current medications as directed. Please refer to the Current Medication list given to you today.  Your physician wants you to follow-up in: 6 month ov/ekg You will receive a reminder letter in the mail two months in advance. If you don't receive a letter, please call our office to schedule the follow-up appointment.

## 2014-05-20 LAB — TSH: TSH: 1.57 u[IU]/mL (ref 0.35–4.50)

## 2014-06-15 ENCOUNTER — Telehealth: Payer: Self-pay | Admitting: Family Medicine

## 2014-06-15 MED ORDER — ALBUTEROL SULFATE HFA 108 (90 BASE) MCG/ACT IN AERS: 2.0000 | INHALATION_SPRAY | Freq: Four times a day (QID) | RESPIRATORY_TRACT | Status: DC | PRN

## 2014-06-15 NOTE — Telephone Encounter (Signed)
Medication refilled per protocol. 

## 2014-07-09 ENCOUNTER — Ambulatory Visit (INDEPENDENT_AMBULATORY_CARE_PROVIDER_SITE_OTHER): Payer: Medicare Other | Admitting: *Deleted

## 2014-07-09 ENCOUNTER — Telehealth: Payer: Self-pay | Admitting: *Deleted

## 2014-07-09 DIAGNOSIS — Z23 Encounter for immunization: Secondary | ICD-10-CM

## 2014-07-09 NOTE — Telephone Encounter (Signed)
Pt came in today to get FLU shot and had some questions about his Lasix and Oxygen, pt wanted to know if he should remain on lasix on his oxygen, I reviewed his cardilogist notes and advised him that he should remain on lasix daily and to use his oxygen on a as needed basis, and advised him to follow up with cards in 6mos per office notes. Pt disagreed and wanted to speak with Dr. Tanya NonesPickard, pt has appointment in a week to come and discuss with Pickard his concerns, I tried to expain to patient the best I could and seemed to not understand advise given by cardiologist.

## 2014-07-20 ENCOUNTER — Encounter: Payer: Self-pay | Admitting: Family Medicine

## 2014-07-20 ENCOUNTER — Ambulatory Visit (INDEPENDENT_AMBULATORY_CARE_PROVIDER_SITE_OTHER): Payer: Medicare Other | Admitting: Family Medicine

## 2014-07-20 VITALS — BP 150/72 | HR 68 | Temp 97.8°F | Resp 20 | Ht 66.5 in | Wt 145.0 lb

## 2014-07-20 DIAGNOSIS — I5032 Chronic diastolic (congestive) heart failure: Secondary | ICD-10-CM

## 2014-07-20 DIAGNOSIS — J449 Chronic obstructive pulmonary disease, unspecified: Secondary | ICD-10-CM

## 2014-07-20 DIAGNOSIS — N183 Chronic kidney disease, stage 3 unspecified: Secondary | ICD-10-CM

## 2014-07-20 DIAGNOSIS — I480 Paroxysmal atrial fibrillation: Secondary | ICD-10-CM

## 2014-07-20 DIAGNOSIS — E119 Type 2 diabetes mellitus without complications: Secondary | ICD-10-CM

## 2014-07-20 LAB — COMPREHENSIVE METABOLIC PANEL
ALBUMIN: 3.7 g/dL (ref 3.5–5.2)
ALK PHOS: 75 U/L (ref 39–117)
ALT: 23 U/L (ref 0–53)
AST: 25 U/L (ref 0–37)
BILIRUBIN TOTAL: 0.4 mg/dL (ref 0.2–1.2)
BUN: 21 mg/dL (ref 6–23)
CO2: 28 meq/L (ref 19–32)
Calcium: 8.9 mg/dL (ref 8.4–10.5)
Chloride: 102 mEq/L (ref 96–112)
Creat: 2.48 mg/dL — ABNORMAL HIGH (ref 0.50–1.35)
Glucose, Bld: 106 mg/dL — ABNORMAL HIGH (ref 70–99)
Potassium: 4.7 mEq/L (ref 3.5–5.3)
Sodium: 138 mEq/L (ref 135–145)
TOTAL PROTEIN: 6.1 g/dL (ref 6.0–8.3)

## 2014-07-20 LAB — LIPID PANEL
CHOLESTEROL: 119 mg/dL (ref 0–200)
HDL: 54 mg/dL (ref >39)
LDL CALC: 49 mg/dL (ref 0–99)
Total CHOL/HDL Ratio: 2.2 Ratio
Triglycerides: 80 mg/dL (ref <150)
VLDL: 16 mg/dL (ref 0–40)

## 2014-07-20 LAB — HEMOGLOBIN A1C
Hgb A1c MFr Bld: 6.6 % — ABNORMAL HIGH (ref <5.7)
Mean Plasma Glucose: 143 mg/dL — ABNORMAL HIGH (ref <117)

## 2014-07-20 NOTE — Progress Notes (Signed)
Subjective:    Patient ID: Mark [name], male    DOB: [birth year], 68 y.o.   MRN: [id number]  HPI Patient is here today for follow-up of his multiple medical problems. He has diabetes mellitus type 2. He is not currently checking his blood sugars. He denies polyuria, polydipsia, or blurred vision. His last hemoglobin A1c was adequate at 6.8. He also has a history of COPD. This is currently managed adequately with Symbicort and Spiriva. He also has a history of diastolic congestive heart failure which is currently managed adequately with Lasix 40 mg tablets daily. His weight has remained stable. He no longer requires oxygen. He has not had a COPD exacerbation in several months. His flu shot is up-to-date. Presently he is in normal sinus rhythm. His heart rate is well controlled. Unfortunately his blood pressure today is elevated. We have avoided ACE inhibitor/angiotensin receptor blockers due to his elevated creatinine. Past Medical History  Diagnosis Date  . GERD (gastroesophageal reflux disease)   . Hyperlipidemia   . H/O hiatal hernia   . Hypertension     Sees Dr. Lynnea FerrierWarren Trayquan Kolakowski  . AAA (abdominal aortic aneurysm)   . COPD (chronic obstructive pulmonary disease)   . Diastolic heart failure     a.  Echo (08/13/2013): EF 55-60%, normal wall motion, mild MR, mild LAE  . Pneumonia   . Arthritis     "little in my hands" (09/08/2013)  . Chronic kidney disease (CKD), stage III (moderate)   . Atrial fibrillation     a. amiodarone Rx started 08/2013;  b. s/p TEE-DCCV 09/2013 => recurrent AFib => repeat DCCV => NSR;   c. Xarelto 15 QD due to CKD  . Hx of cardiovascular stress test     a. Myoview 09/2012:  no scar or ischemia   Past Surgical History  Procedure Laterality Date  . Knee arthroscopy Right   . Abdominal aortic aneurysm repair  10/15/2012    Procedure: ANEURYSM ABDOMINAL AORTIC REPAIR;  Surgeon: Pryor OchoaJames D Lawson, MD;  [facility];  Service: Vascular;  Laterality: N/A;   Resection and Grafting of Abdominal Aortic Aneurysm - Aortobi-Iliac   . Tee without cardioversion N/A 09/22/2013    Procedure: TRANSESOPHAGEAL ECHOCARDIOGRAM (TEE);  Surgeon: Lars MassonKatarina H Nelson, MD;  [facility];  Service: Cardiovascular;  Laterality: N/A;  . Cardioversion N/A 09/22/2013    Procedure: CARDIOVERSION;  Surgeon: Lars MassonKatarina H Nelson, MD;  [facility];  Service: Cardiovascular;  Laterality: N/A;  . Abdominal aortic aneurysm repair  10-15-2012  . Cardioversion  Jan. 12, 2015    Dr. Doylene CanningGregg W. Ladona Ridgelaylor   Current Outpatient Prescriptions on File Prior to Visit  Medication Sig Dispense Refill  . acetaminophen (TYLENOL) 325 MG tablet Take 2 tablets (650 mg total) by mouth every 6 (six) hours as needed for mild pain (or Fever >/= 101). 30 tablet 1  . albuterol (VENTOLIN HFA) 108 (90 BASE) MCG/ACT inhaler Inhale 2 puffs into the lungs every 6 (six) hours as needed for wheezing or shortness of breath. 1 Inhaler 6  . amiodarone (PACERONE) 200 MG tablet Take 1 tablet (200 mg total) by mouth daily. 30 tablet 11  . atorvastatin (LIPITOR) 40 MG tablet Take 40 mg by mouth daily.    . budesonide-formoterol (SYMBICORT) 160-4.5 MCG/ACT inhaler Inhale 2 puffs into the lungs 2 (two) times daily. 1 Inhaler 11  . diltiazem (CARDIZEM CD) 360 MG 24 hr capsule Take 360 mg by mouth daily.     Marland Kitchen. esomeprazole (NEXIUM)  40 MG capsule Take 40 mg by mouth daily at 12 noon.    . furosemide (LASIX) 40 MG tablet Take 40 mg by mouth daily.     Marland Kitchen. linagliptin (TRADJENTA) 5 MG TABS tablet Take 1 tablet (5 mg total) by mouth daily. 30 tablet 11  . metoprolol succinate (TOPROL-XL) 25 MG 24 hr tablet Take 75 mg by mouth daily.    . Rivaroxaban (XARELTO) 15 MG TABS tablet Take 1 tablet (15 mg total) by mouth daily. 30 tablet 11  . sodium chloride (OCEAN) 0.65 % SOLN nasal spray Place 1 spray into both nostrils as needed for congestion.    Marland Kitchen. tiotropium (SPIRIVA HANDIHALER) 18 MCG inhalation capsule Place 1 capsule  (18 mcg total) into inhaler and inhale daily. 30 capsule 11   No current facility-administered medications on file prior to visit.   Allergies  Allergen Reactions  . Niaspan [Niacin Er] Anaphylaxis   History   Social History  . Marital Status: Married    Spouse Name: [name]    Number of Children: 4  . Years of Education: N/A   Occupational History  . Works PT as a Education administratorpainter    Social History Main Topics  . Smoking status: Former Smoker -- 1.00 packs/day for 56 years    Types: Cigarettes    Quit date: 07/31/2013  . Smokeless tobacco: Former NeurosurgeonUser    Types: Chew     Comment: 09/08/2013 "stopped smoking cigarettes ~ 2 months ago; aien't chew in probably 10 yrs"  . Alcohol Use: No  . Drug Use: No  . Sexual Activity: Not Currently   Other Topics Concern  . Not on file   Social History Narrative   Married.  Lives with wife.  Ambulates without assistance.      Review of Systems  All other systems reviewed and are negative.      Objective:   Physical Exam  Constitutional: He appears well-developed and well-nourished.  HENT:  Nose: Nose normal.  Mouth/Throat: Oropharynx is clear and moist. No oropharyngeal exudate.  Eyes: Conjunctivae are normal. No scleral icterus.  Neck: Neck supple. No thyromegaly present.  Cardiovascular: Normal rate, regular rhythm and normal heart sounds.   Pulmonary/Chest: Effort normal and breath sounds normal. No respiratory distress. He has no wheezes. He has no rales.  Abdominal: Soft. Bowel sounds are normal. He exhibits no distension. There is no tenderness. There is no rebound and no guarding.  Musculoskeletal: He exhibits no edema.  Lymphadenopathy:    He has no cervical adenopathy.  Vitals reviewed.         Assessment & Plan:  Paroxysmal atrial fibrillation  Chronic kidney disease (CKD), stage III (moderate)  Chronic diastolic congestive heart failure  Diabetes mellitus type II, controlled  COPD (chronic obstructive  pulmonary disease) with chronic bronchitis  Patient's atrial fibrillation is currently both rate and rhythm controlled. He continues to take xarelto or stroke prevention. I will check a fasting lipid panel today. His goal LDL cholesterol is less than 100. Currently his COPD is well managed with spiriva and symbicort.  Patient requests that I discontinue his oxygen to save him money. He has not used his oxygen over a month. His flu shot and pneumonia shot are up-to-date. I will check a CMP. If his renal function is stable I would like to start the patient on a low-dose angiotensin receptor blocker for his chronic kidney disease and for renal protection of his diabetes and to help lower his blood pressure less than  140/90. 

## 2014-07-20 NOTE — Addendum Note (Signed)
Addended by: Legrand RamsWILLIS, SANDY B on: 07/20/2014 09:51 AM   Modules accepted: Orders

## 2014-07-23 ENCOUNTER — Other Ambulatory Visit: Payer: Self-pay | Admitting: Family Medicine

## 2014-07-23 MED ORDER — LOSARTAN POTASSIUM 50 MG PO TABS: 50.0000 mg | ORAL_TABLET | Freq: Every day | ORAL | Status: DC

## 2014-08-09 ENCOUNTER — Encounter: Payer: Self-pay | Admitting: Family Medicine

## 2014-08-09 ENCOUNTER — Ambulatory Visit (INDEPENDENT_AMBULATORY_CARE_PROVIDER_SITE_OTHER): Payer: Medicare Other | Admitting: Family Medicine

## 2014-08-09 VITALS — BP 130/56 | HR 68 | Temp 97.4°F | Resp 20 | Ht 66.5 in | Wt 147.0 lb

## 2014-08-09 DIAGNOSIS — N184 Chronic kidney disease, stage 4 (severe): Secondary | ICD-10-CM

## 2014-08-09 DIAGNOSIS — I480 Paroxysmal atrial fibrillation: Secondary | ICD-10-CM

## 2014-08-09 DIAGNOSIS — J449 Chronic obstructive pulmonary disease, unspecified: Secondary | ICD-10-CM

## 2014-08-09 DIAGNOSIS — E119 Type 2 diabetes mellitus without complications: Secondary | ICD-10-CM

## 2014-08-09 NOTE — Progress Notes (Signed)
Subjective:    Patient ID: Mark [name], male    DOB: [birth year], 68 y.o.   MRN: [id number]  HPI Patient is a very pleasant 68 yo wm with htn, hld, NIDDM, COPD, CKD, and paroxysmal atrial fibrillation. He is here today for follow-up. His blood pressures well controlled at 130/56. He denies any chest pain shortness of breath or dyspnea on exertion. His COPD remains well controlled currently on Spiriva and Symbicort. He no longer is requiring oxygen. He is active and able to work. He has not had a recent exacerbation. He denies any cough, hemoptysis, or pleurisy. He is in normal sinus rhythm today with a normal heart rate of 68 bpm. He continues to use xarelto for stroke prevention. He denies any bleeding or bruising. His most recent lab work as listed below. He denies any polyuria, polydipsia, or blurred vision. He denies any neuropathy in his feet. Office Visit on 07/20/2014  Component Date Value Ref Range Status  . Sodium 07/20/2014 138  135 - 145 mEq/L Final  . Potassium 07/20/2014 4.7  3.5 - 5.3 mEq/L Final  . Chloride 07/20/2014 102  96 - 112 mEq/L Final  . CO2 07/20/2014 28  19 - 32 mEq/L Final  . Glucose, Bld 07/20/2014 106* 70 - 99 mg/dL Final  . BUN 40/98/1191 21  6 - 23 mg/dL Final  . Creat 47/82/9562 2.48* 0.50 - 1.35 mg/dL Final  . Total Bilirubin 07/20/2014 0.4  0.2 - 1.2 mg/dL Final  . Alkaline Phosphatase 07/20/2014 75  39 - 117 U/L Final  . AST 07/20/2014 25  0 - 37 U/L Final  . ALT 07/20/2014 23  0 - 53 U/L Final  . Total Protein 07/20/2014 6.1  6.0 - 8.3 g/dL Final  . Albumin 13/04/6577 3.7  3.5 - 5.2 g/dL Final  . Calcium 46/96/2952 8.9  8.4 - 10.5 mg/dL Final  . Cholesterol 84/13/2440 119  0 - 200 mg/dL Final   Comment: ATP III Classification:       < 200        mg/dL        Desirable      102 - 239     mg/dL        Borderline High      >= 240        mg/dL        High     . Triglycerides 07/20/2014 80  <150 mg/dL Final  . HDL 72/53/6644 54  >39 mg/dL Final    . Total CHOL/HDL Ratio 07/20/2014 2.2   Final  . VLDL 07/20/2014 16  0 - 40 mg/dL Final  . LDL Cholesterol 07/20/2014 49  0 - 99 mg/dL Final   Comment:   Total Cholesterol/HDL Ratio:CHD Risk                        Coronary Heart Disease Risk Table                                        Men       Women          1/2 Average Risk              3.4        3.3              Average Risk  5.0        4.4           2X Average Risk              9.6        7.1           3X Average Risk             23.4       11.0 Use the calculated Patient Ratio above and the CHD Risk table  to determine the patient's CHD Risk. ATP III Classification (LDL):       < 100        mg/dL         Optimal      378 - 129     mg/dL         Near or Above Optimal      130 - 159     mg/dL         Borderline High      160 - 189     mg/dL         High       > 588        mg/dL         Very High     . Hgb A1c MFr Bld 07/20/2014 6.6* <5.7 % Final   Comment:                                                                        According to the ADA Clinical Practice Recommendations for 2011, when HbA1c is used as a screening test:     >=6.5%   Diagnostic of Diabetes Mellitus            (if abnormal result is confirmed)   5.7-6.4%   Increased risk of developing Diabetes Mellitus   References:Diagnosis and Classification of Diabetes Mellitus,Diabetes Care,2011,34(Suppl 1):S62-S69 and Standards of Medical Care in         Diabetes - 2011,Diabetes Care,2011,34 (Suppl 1):S11-S61.     . Mean Plasma Glucose 07/20/2014 143* <117 mg/dL Final   Past Medical History  Diagnosis Date  . GERD (gastroesophageal reflux disease)   . Hyperlipidemia   . H/O hiatal hernia   . Hypertension     Sees Dr. Lynnea Ferrier  . AAA (abdominal aortic aneurysm)   . COPD (chronic obstructive pulmonary disease)   . Diastolic heart failure     a.  Echo (08/13/2013): EF 55-60%, normal wall motion, mild MR, mild LAE  . Pneumonia   .  Arthritis     "little in my hands" (09/08/2013)  . Chronic kidney disease (CKD), stage III (moderate)   . Atrial fibrillation     a. amiodarone Rx started 08/2013;  b. s/p TEE-DCCV 09/2013 => recurrent AFib => repeat DCCV => NSR;   c. Xarelto 15 QD due to CKD  . Hx of cardiovascular stress test     a. Myoview 09/2012:  no scar or ischemia   Past Surgical History  Procedure Laterality Date  . Knee arthroscopy Right   . Abdominal aortic aneurysm repair  10/15/2012    Procedure: ANEURYSM ABDOMINAL AORTIC REPAIR;  Surgeon: Pryor Ochoa, MD;  Location: [facility]  OR;  Service: Vascular;  Laterality: N/A;  Resection and Grafting of Abdominal Aortic Aneurysm - Aortobi-Iliac   . Tee without cardioversion N/A 09/22/2013    Procedure: TRANSESOPHAGEAL ECHOCARDIOGRAM (TEE);  Surgeon: Lars MassonKatarina H Nelson, MD;  [facility];  Service: Cardiovascular;  Laterality: N/A;  . Cardioversion N/A 09/22/2013    Procedure: CARDIOVERSION;  Surgeon: Lars MassonKatarina H Nelson, MD;  [facility];  Service: Cardiovascular;  Laterality: N/A;  . Abdominal aortic aneurysm repair  10-15-2012  . Cardioversion  Jan. 12, 2015    Dr. Doylene CanningGregg W. Ladona Ridgelaylor   Current Outpatient Prescriptions on File Prior to Visit  Medication Sig Dispense Refill  . acetaminophen (TYLENOL) 325 MG tablet Take 2 tablets (650 mg total) by mouth every 6 (six) hours as needed for mild pain (or Fever >/= 101). 30 tablet 1  . albuterol (VENTOLIN HFA) 108 (90 BASE) MCG/ACT inhaler Inhale 2 puffs into the lungs every 6 (six) hours as needed for wheezing or shortness of breath. 1 Inhaler 6  . amiodarone (PACERONE) 200 MG tablet Take 1 tablet (200 mg total) by mouth daily. 30 tablet 11  . atorvastatin (LIPITOR) 40 MG tablet Take 40 mg by mouth daily.    . budesonide-formoterol (SYMBICORT) 160-4.5 MCG/ACT inhaler Inhale 2 puffs into the lungs 2 (two) times daily. 1 Inhaler 11  . diltiazem (CARDIZEM CD) 360 MG 24 hr capsule Take 360 mg by mouth daily.     Marland Kitchen.  esomeprazole (NEXIUM) 40 MG capsule Take 40 mg by mouth daily at 12 noon.    . furosemide (LASIX) 40 MG tablet Take 40 mg by mouth daily.     Marland Kitchen. linagliptin (TRADJENTA) 5 MG TABS tablet Take 1 tablet (5 mg total) by mouth daily. 30 tablet 11  . losartan (COZAAR) 50 MG tablet Take 1 tablet (50 mg total) by mouth daily. 30 tablet 3  . metoprolol succinate (TOPROL-XL) 25 MG 24 hr tablet Take 75 mg by mouth daily.    . Rivaroxaban (XARELTO) 15 MG TABS tablet Take 1 tablet (15 mg total) by mouth daily. 30 tablet 11  . sodium chloride (OCEAN) 0.65 % SOLN nasal spray Place 1 spray into both nostrils as needed for congestion.    Marland Kitchen. tiotropium (SPIRIVA HANDIHALER) 18 MCG inhalation capsule Place 1 capsule (18 mcg total) into inhaler and inhale daily. 30 capsule 11   No current facility-administered medications on file prior to visit.   Allergies  Allergen Reactions  . Niaspan [Niacin Er] Anaphylaxis   History   Social History  . Marital Status: Married    Spouse Name: [name]    Number of Children: 4  . Years of Education: N/A   Occupational History  . Works PT as a Education administratorpainter    Social History Main Topics  . Smoking status: Former Smoker -- 1.00 packs/day for 56 years    Types: Cigarettes    Quit date: 07/31/2013  . Smokeless tobacco: Former NeurosurgeonUser    Types: Chew     Comment: 09/08/2013 "stopped smoking cigarettes ~ 2 months ago; aien't chew in probably 10 yrs"  . Alcohol Use: No  . Drug Use: No  . Sexual Activity: Not Currently   Other Topics Concern  . Not on file   Social History Narrative   Married.  Lives with wife.  Ambulates without assistance.      Review of Systems  All other systems reviewed and are negative.      Objective:   Physical Exam  Constitutional: He appears  well-developed and well-nourished.  Neck: Neck supple. No JVD present. No thyromegaly present.  Cardiovascular: Normal rate, regular rhythm and normal heart sounds.  Exam reveals no gallop.   No murmur  heard. Pulmonary/Chest: Effort normal and breath sounds normal. No respiratory distress. He has no wheezes. He has no rales.  Abdominal: Soft. Bowel sounds are normal. He exhibits no distension and no mass. There is no tenderness. There is no rebound and no guarding.  Musculoskeletal: He exhibits no edema.  Lymphadenopathy:    He has no cervical adenopathy.  Vitals reviewed.         Assessment & Plan:  Paroxysmal atrial fibrillation  Diabetes mellitus type II, controlled  COPD (chronic obstructive pulmonary disease) with chronic bronchitis  CKD (chronic kidney disease) stage 4, GFR 15-29 ml/min  Patient's blood pressures well controlled. His heart rate is adequately controlled in normal sinus rhythm. He is appropriately anticoagulated for stroke prevention. His cholesterol is excellent. His hemoglobin A1c is well within normal limits. I'll make no changes in his medication at this time. His creatinine is stable. Recheck lab work in 6 months.

## 2014-08-10 ENCOUNTER — Other Ambulatory Visit: Payer: Self-pay | Admitting: Family Medicine

## 2014-08-26 ENCOUNTER — Encounter (HOSPITAL_COMMUNITY): Payer: Self-pay | Admitting: Internal Medicine

## 2014-08-27 ENCOUNTER — Inpatient Hospital Stay (HOSPITAL_COMMUNITY)
Admission: EM | Admit: 2014-08-27 | Discharge: 2014-09-01 | DRG: 291 | Disposition: A | Discharge status: Home / Self Care | Payer: Medicare Other | Attending: Cardiology | Admitting: Cardiology

## 2014-08-27 ENCOUNTER — Emergency Department (HOSPITAL_COMMUNITY): Payer: Medicare Other

## 2014-08-27 ENCOUNTER — Encounter (HOSPITAL_COMMUNITY): Payer: Self-pay | Admitting: General Practice

## 2014-08-27 DIAGNOSIS — E785 Hyperlipidemia, unspecified: Secondary | ICD-10-CM | POA: Diagnosis present

## 2014-08-27 DIAGNOSIS — Z96651 Presence of right artificial knee joint: Secondary | ICD-10-CM | POA: Diagnosis present

## 2014-08-27 DIAGNOSIS — Z79899 Other long term (current) drug therapy: Secondary | ICD-10-CM

## 2014-08-27 DIAGNOSIS — Z888 Allergy status to other drugs, medicaments and biological substances status: Secondary | ICD-10-CM

## 2014-08-27 DIAGNOSIS — N184 Chronic kidney disease, stage 4 (severe): Secondary | ICD-10-CM

## 2014-08-27 DIAGNOSIS — I480 Paroxysmal atrial fibrillation: Secondary | ICD-10-CM | POA: Diagnosis present

## 2014-08-27 DIAGNOSIS — J441 Chronic obstructive pulmonary disease with (acute) exacerbation: Secondary | ICD-10-CM | POA: Diagnosis present

## 2014-08-27 DIAGNOSIS — I5033 Acute on chronic diastolic (congestive) heart failure: Secondary | ICD-10-CM | POA: Diagnosis not present

## 2014-08-27 DIAGNOSIS — J189 Pneumonia, unspecified organism: Secondary | ICD-10-CM | POA: Diagnosis present

## 2014-08-27 DIAGNOSIS — R740 Nonspecific elevation of levels of transaminase and lactic acid dehydrogenase [LDH]: Secondary | ICD-10-CM

## 2014-08-27 DIAGNOSIS — D649 Anemia, unspecified: Secondary | ICD-10-CM | POA: Diagnosis present

## 2014-08-27 DIAGNOSIS — R635 Abnormal weight gain: Secondary | ICD-10-CM | POA: Diagnosis present

## 2014-08-27 DIAGNOSIS — R7401 Elevation of levels of liver transaminase levels: Secondary | ICD-10-CM

## 2014-08-27 DIAGNOSIS — I714 Abdominal aortic aneurysm, without rupture, unspecified: Secondary | ICD-10-CM | POA: Diagnosis present

## 2014-08-27 DIAGNOSIS — N183 Chronic kidney disease, stage 3 (moderate): Secondary | ICD-10-CM

## 2014-08-27 DIAGNOSIS — I10 Essential (primary) hypertension: Secondary | ICD-10-CM | POA: Diagnosis present

## 2014-08-27 DIAGNOSIS — E119 Type 2 diabetes mellitus without complications: Secondary | ICD-10-CM | POA: Diagnosis present

## 2014-08-27 DIAGNOSIS — Z87891 Personal history of nicotine dependence: Secondary | ICD-10-CM

## 2014-08-27 DIAGNOSIS — K219 Gastro-esophageal reflux disease without esophagitis: Secondary | ICD-10-CM | POA: Diagnosis present

## 2014-08-27 DIAGNOSIS — J9600 Acute respiratory failure, unspecified whether with hypoxia or hypercapnia: Secondary | ICD-10-CM | POA: Diagnosis present

## 2014-08-27 DIAGNOSIS — I129 Hypertensive chronic kidney disease with stage 1 through stage 4 chronic kidney disease, or unspecified chronic kidney disease: Secondary | ICD-10-CM | POA: Diagnosis present

## 2014-08-27 DIAGNOSIS — I509 Heart failure, unspecified: Secondary | ICD-10-CM

## 2014-08-27 HISTORY — DX: Type 2 diabetes mellitus without complications: E11.9

## 2014-08-27 LAB — COMPREHENSIVE METABOLIC PANEL
ALT: 326 U/L — AB (ref 0–53)
ANION GAP: 18 — AB (ref 5–15)
AST: 305 U/L — ABNORMAL HIGH (ref 0–37)
Albumin: 3 g/dL — ABNORMAL LOW (ref 3.5–5.2)
Alkaline Phosphatase: 76 U/L (ref 39–117)
BUN: 32 mg/dL — ABNORMAL HIGH (ref 6–23)
CO2: 22 meq/L (ref 19–32)
Calcium: 9.4 mg/dL (ref 8.4–10.5)
Chloride: 95 mEq/L — ABNORMAL LOW (ref 96–112)
Creatinine, Ser: 2.52 mg/dL — ABNORMAL HIGH (ref 0.50–1.35)
GFR, EST AFRICAN AMERICAN: 28 mL/min — AB (ref >90)
GFR, EST NON AFRICAN AMERICAN: 24 mL/min — AB (ref >90)
GLUCOSE: 172 mg/dL — AB (ref 70–99)
Potassium: 4.8 mEq/L (ref 3.7–5.3)
Sodium: 135 mEq/L — ABNORMAL LOW (ref 137–147)
Total Bilirubin: 0.4 mg/dL (ref 0.3–1.2)
Total Protein: 7.3 g/dL (ref 6.0–8.3)

## 2014-08-27 LAB — CBC
HCT: 31.8 % — ABNORMAL LOW (ref 39.0–52.0)
Hemoglobin: 10.2 g/dL — ABNORMAL LOW (ref 13.0–17.0)
MCH: 25.8 pg — AB (ref 26.0–34.0)
MCHC: 32.1 g/dL (ref 30.0–36.0)
MCV: 80.5 fL (ref 78.0–100.0)
Platelets: 208 10*3/uL (ref 150–400)
RBC: 3.95 MIL/uL — ABNORMAL LOW (ref 4.22–5.81)
RDW: 18.9 % — AB (ref 11.5–15.5)
WBC: 9.5 10*3/uL (ref 4.0–10.5)

## 2014-08-27 LAB — I-STAT TROPONIN, ED: Troponin i, poc: 0.02 ng/mL (ref 0.00–0.08)

## 2014-08-27 LAB — TROPONIN I: Troponin I: <0.3 ng/mL (ref <0.30)

## 2014-08-27 LAB — I-STAT CG4 LACTIC ACID, ED: LACTIC ACID, VENOUS: 3.02 mmol/L — AB (ref 0.5–2.2)

## 2014-08-27 LAB — GLUCOSE, CAPILLARY
Glucose-Capillary: 180 mg/dL — ABNORMAL HIGH (ref 70–99)
Glucose-Capillary: 167 mg/dL — ABNORMAL HIGH (ref 70–99)

## 2014-08-27 LAB — I-STAT CHEM 8, ED
BUN: 38 mg/dL — AB (ref 6–23)
CALCIUM ION: 1.12 mmol/L — AB (ref 1.13–1.30)
CREATININE: 2.7 mg/dL — AB (ref 0.50–1.35)
Chloride: 102 mEq/L (ref 96–112)
Glucose, Bld: 178 mg/dL — ABNORMAL HIGH (ref 70–99)
HCT: 35 % — ABNORMAL LOW (ref 39.0–52.0)
HEMOGLOBIN: 11.9 g/dL — AB (ref 13.0–17.0)
Potassium: 4.5 mEq/L (ref 3.7–5.3)
Sodium: 131 mEq/L — ABNORMAL LOW (ref 137–147)
TCO2: 23 mmol/L (ref 0–100)

## 2014-08-27 LAB — PRO B NATRIURETIC PEPTIDE: PRO B NATRI PEPTIDE: 7267 pg/mL — AB (ref 0–125)

## 2014-08-27 LAB — LIPASE, BLOOD: LIPASE: 21 U/L (ref 11–59)

## 2014-08-27 LAB — TSH: TSH: 1.79 u[IU]/mL (ref 0.350–4.500)

## 2014-08-27 LAB — CBG MONITORING, ED: Glucose-Capillary: 133 mg/dL — ABNORMAL HIGH (ref 70–99)

## 2014-08-27 LAB — PROTIME-INR
INR: 2.09 — AB (ref 0.00–1.49)
Prothrombin Time: 23.6 seconds — ABNORMAL HIGH (ref 11.6–15.2)

## 2014-08-27 LAB — MRSA PCR SCREENING: MRSA BY PCR: NEGATIVE

## 2014-08-27 MED ORDER — INSULIN ASPART 100 UNIT/ML ~~LOC~~ SOLN
0.0000 [IU] | Freq: Every day | SUBCUTANEOUS | Status: DC
  Administered 2014-08-30: 2 [IU] via SUBCUTANEOUS

## 2014-08-27 MED ORDER — PREDNISONE 10 MG PO TABS: 60.0000 mg | ORAL_TABLET | Freq: Every day | ORAL | Status: DC

## 2014-08-27 MED ORDER — RIVAROXABAN 15 MG PO TABS
15.0000 mg | ORAL_TABLET | Freq: Every day | ORAL | Status: DC
  Administered 2014-08-27 – 2014-09-01 (×6): 15 mg via ORAL
  Filled 2014-08-27 (×6): qty 1

## 2014-08-27 MED ORDER — FUROSEMIDE 10 MG/ML IJ SOLN
80.0000 mg | Freq: Once | INTRAMUSCULAR | Status: AC
  Administered 2014-08-27: 80 mg via INTRAVENOUS
  Filled 2014-08-27: qty 8

## 2014-08-27 MED ORDER — PREDNISONE 50 MG PO TABS
60.0000 mg | ORAL_TABLET | Freq: Every day | ORAL | Status: DC
  Administered 2014-08-27 – 2014-08-28 (×2): 60 mg via ORAL
  Filled 2014-08-27: qty 1
  Filled 2014-08-27: qty 3
  Filled 2014-08-27: qty 1

## 2014-08-27 MED ORDER — SODIUM CHLORIDE 0.9 % IJ SOLN
3.0000 mL | Freq: Two times a day (BID) | INTRAMUSCULAR | Status: DC
  Administered 2014-08-27 – 2014-09-01 (×8): 3 mL via INTRAVENOUS

## 2014-08-27 MED ORDER — DOXYCYCLINE HYCLATE 100 MG PO TABS
100.0000 mg | ORAL_TABLET | Freq: Two times a day (BID) | ORAL | Status: DC
  Administered 2014-08-27 – 2014-08-31 (×9): 100 mg via ORAL
  Filled 2014-08-27 (×12): qty 1

## 2014-08-27 MED ORDER — SODIUM CHLORIDE 0.9 % IJ SOLN: 3.0000 mL | INTRAMUSCULAR | Status: DC | PRN

## 2014-08-27 MED ORDER — NITROGLYCERIN IN D5W 200-5 MCG/ML-% IV SOLN
50.0000 ug/min | INTRAVENOUS | Status: DC
  Administered 2014-08-27: 50 ug/min via INTRAVENOUS

## 2014-08-27 MED ORDER — NITROGLYCERIN IN D5W 200-5 MCG/ML-% IV SOLN
INTRAVENOUS | Status: AC
  Administered 2014-08-27: 50 ug/min via INTRAVENOUS
  Filled 2014-08-27: qty 250

## 2014-08-27 MED ORDER — NITROGLYCERIN IN D5W 200-5 MCG/ML-% IV SOLN
0.0000 ug/min | INTRAVENOUS | Status: DC
  Administered 2014-08-27: 20 ug/min via INTRAVENOUS
  Administered 2014-08-27: 10 ug/min via INTRAVENOUS

## 2014-08-27 MED ORDER — FUROSEMIDE 10 MG/ML IJ SOLN
80.0000 mg | Freq: Two times a day (BID) | INTRAMUSCULAR | Status: DC
  Administered 2014-08-27 – 2014-08-31 (×8): 80 mg via INTRAVENOUS
  Filled 2014-08-27 (×9): qty 8

## 2014-08-27 MED ORDER — ONDANSETRON HCL 4 MG/2ML IJ SOLN: 4.0000 mg | Freq: Four times a day (QID) | INTRAMUSCULAR | Status: DC | PRN

## 2014-08-27 MED ORDER — PANTOPRAZOLE SODIUM 40 MG PO TBEC
40.0000 mg | DELAYED_RELEASE_TABLET | Freq: Every day | ORAL | Status: DC
  Administered 2014-08-27 – 2014-09-01 (×6): 40 mg via ORAL
  Filled 2014-08-27 (×6): qty 1

## 2014-08-27 MED ORDER — AMIODARONE HCL 200 MG PO TABS
200.0000 mg | ORAL_TABLET | Freq: Every day | ORAL | Status: DC
  Administered 2014-08-27 – 2014-09-01 (×6): 200 mg via ORAL
  Filled 2014-08-27 (×6): qty 1

## 2014-08-27 MED ORDER — INSULIN ASPART 100 UNIT/ML ~~LOC~~ SOLN
0.0000 [IU] | Freq: Three times a day (TID) | SUBCUTANEOUS | Status: DC
  Administered 2014-08-27: 2 [IU] via SUBCUTANEOUS
  Administered 2014-08-27 – 2014-08-29 (×3): 3 [IU] via SUBCUTANEOUS
  Administered 2014-08-29: 2 [IU] via SUBCUTANEOUS
  Administered 2014-08-30: 8 [IU] via SUBCUTANEOUS
  Administered 2014-08-30 – 2014-08-31 (×3): 3 [IU] via SUBCUTANEOUS
  Administered 2014-09-01 (×2): 2 [IU] via SUBCUTANEOUS
  Filled 2014-08-27: qty 1

## 2014-08-27 MED ORDER — DEXTROSE 5 % IV SOLN
1.0000 g | INTRAVENOUS | Status: DC
  Administered 2014-08-27 – 2014-08-31 (×5): 1 g via INTRAVENOUS
  Filled 2014-08-27 (×5): qty 10

## 2014-08-27 MED ORDER — ATORVASTATIN CALCIUM 40 MG PO TABS
40.0000 mg | ORAL_TABLET | Freq: Every day | ORAL | Status: DC
  Administered 2014-08-27 – 2014-08-30 (×4): 40 mg via ORAL
  Filled 2014-08-27 (×4): qty 1

## 2014-08-27 MED ORDER — LINAGLIPTIN 5 MG PO TABS
5.0000 mg | ORAL_TABLET | Freq: Every day | ORAL | Status: DC
  Administered 2014-08-27 – 2014-09-01 (×6): 5 mg via ORAL
  Filled 2014-08-27 (×6): qty 1

## 2014-08-27 MED ORDER — CEFTRIAXONE SODIUM IN DEXTROSE 20 MG/ML IV SOLN: 1.0000 g | INTRAVENOUS | Status: DC

## 2014-08-27 MED ORDER — DOXYCYCLINE HYCLATE 100 MG PO TABS: 100.0000 mg | ORAL_TABLET | Freq: Two times a day (BID) | ORAL | Status: DC

## 2014-08-27 MED ORDER — METOPROLOL SUCCINATE ER 50 MG PO TB24
75.0000 mg | ORAL_TABLET | Freq: Every day | ORAL | Status: DC
  Administered 2014-08-27 – 2014-09-01 (×5): 75 mg via ORAL
  Filled 2014-08-27 (×6): qty 1

## 2014-08-27 MED ORDER — NITROGLYCERIN 2 % TD OINT: 1.0000 [in_us] | TOPICAL_OINTMENT | Freq: Once | TRANSDERMAL | Status: DC

## 2014-08-27 MED ORDER — SODIUM CHLORIDE 0.9 % IV SOLN: 250.0000 mL | INTRAVENOUS | Status: DC | PRN

## 2014-08-27 MED ORDER — SALINE SPRAY 0.65 % NA SOLN
1.0000 | NASAL | Status: DC | PRN
  Filled 2014-08-27: qty 44

## 2014-08-27 MED ORDER — IPRATROPIUM BROMIDE 0.02 % IN SOLN: 0.5000 mg | Freq: Four times a day (QID) | RESPIRATORY_TRACT | Status: DC

## 2014-08-27 MED ORDER — IPRATROPIUM BROMIDE 0.02 % IN SOLN
0.5000 mg | Freq: Four times a day (QID) | RESPIRATORY_TRACT | Status: DC
  Administered 2014-08-27 – 2014-08-30 (×3): 0.5 mg via RESPIRATORY_TRACT
  Filled 2014-08-27 (×4): qty 2.5

## 2014-08-27 MED ORDER — DILTIAZEM HCL ER COATED BEADS 360 MG PO CP24
360.0000 mg | ORAL_CAPSULE | Freq: Every day | ORAL | Status: DC
  Administered 2014-08-27 – 2014-09-01 (×6): 360 mg via ORAL
  Filled 2014-08-27 (×6): qty 1

## 2014-08-27 NOTE — ED Notes (Signed)
Patient continues to have cough.  States he is unsure if he is feeling better.  Wife at bedside.  She is aware of plan of care and plan to admit.  Urinal at bedside.  No output at this time.  Patient had reported normal output at home.  Will continue to monitor

## 2014-08-27 NOTE — Progress Notes (Signed)
Utilization review completed.  

## 2014-08-27 NOTE — Progress Notes (Signed)
  Echocardiogram 2D Echocardiogram has been performed.  Delcie RochENNINGTON, Raymel Cull 08/27/2014, 2:36 PM

## 2014-08-27 NOTE — Progress Notes (Signed)
Pt refuses Atrovent neb, prefers home reg. Spiriva and Symbicort with Albuterol prn

## 2014-08-27 NOTE — ED Notes (Signed)
Breathing treatment in progress patient is resting on stretcher, no complaints at present.

## 2014-08-27 NOTE — ED Notes (Signed)
Attempted report x1. 

## 2014-08-27 NOTE — ED Notes (Signed)
[facility],RN.  Patient noted to get more sob with any movement, pulse ox improved with rest and deep breathing.

## 2014-08-27 NOTE — ED Provider Notes (Signed)
CSN: [id number]     Arrival date & time 08/27/14  0544 History   First MD Initiated Contact with Patient 08/27/14 0554     Chief Complaint  Patient presents with  . Shortness of Breath     (Consider location/radiation/quality/duration/timing/severity/associated sxs/prior Treatment) HPI  Benjiman Noland Pizano is a 68 y.o. male with past medical history of hypertension, hyperlipidemia, AAA, COPD, CHF coming in with shortness of breath. Patient states he's been having difficulty breathing for the past 2 days, he's had a 5 pound weight gain over that same time. He's been compliant with his Lasix medication. He continues to have a worsening cough, sleep orthopnea, dyspnea on exertion. Patient also missed a worsening swelling in his legs. He sees [facility] cardiology here. He is denying any chest pain associated with this. He's had no fevers chills or recent infections. Patient has no further complaints  10 Systems reviewed and are negative for acute change except as noted in the HPI.     Past Medical History  Diagnosis Date  . GERD (gastroesophageal reflux disease)   . Hyperlipidemia   . H/O hiatal hernia   . Hypertension     Sees Dr. Lynnea Ferrier  . AAA (abdominal aortic aneurysm)   . COPD (chronic obstructive pulmonary disease)   . Diastolic heart failure     a.  Echo (08/13/2013): EF 55-60%, normal wall motion, mild MR, mild LAE  . Pneumonia   . Arthritis     "little in my hands" (09/08/2013)  . Chronic kidney disease (CKD), stage III (moderate)   . Atrial fibrillation     a. amiodarone Rx started 08/2013;  b. s/p TEE-DCCV 09/2013 => recurrent AFib => repeat DCCV => NSR;   c. Xarelto 15 QD due to CKD  . Hx of cardiovascular stress test     a. Myoview 09/2012:  no scar or ischemia   Past Surgical History  Procedure Laterality Date  . Knee arthroscopy Right   . Abdominal aortic aneurysm repair  10/15/2012    Procedure: ANEURYSM ABDOMINAL AORTIC REPAIR;  Surgeon: Pryor Ochoa, MD;   Location: [facility] OR;  Service: Vascular;  Laterality: N/A;  Resection and Grafting of Abdominal Aortic Aneurysm - Aortobi-Iliac   . Tee without cardioversion N/A 09/22/2013    Procedure: TRANSESOPHAGEAL ECHOCARDIOGRAM (TEE);  Surgeon: Lars Masson, MD;  Location: [facility] ENDOSCOPY;  Service: Cardiovascular;  Laterality: N/A;  . Cardioversion N/A 09/22/2013    Procedure: CARDIOVERSION;  Surgeon: Lars Masson, MD;  Location: [facility] ENDOSCOPY;  Service: Cardiovascular;  Laterality: N/A;  . Abdominal aortic aneurysm repair  10-15-2012  . Cardioversion  Jan. 12, 2015    Dr. Doylene Canning. Ladona Ridgel  . Cardioversion N/A 09/28/2013    Procedure: CARDIOVERSION;  Surgeon: Marinus Maw, MD;  Location: [facility] CATH LAB;  Service: Cardiovascular;  Laterality: N/A;   Family History  Problem Relation Age of Onset  . Diabetes Mother   . Tuberculosis Mother   . Heart disease Father   . Heart attack Father   . Heart disease Son     Blood clot:  Arm   History  Substance Use Topics  . Smoking status: Former Smoker -- 1.00 packs/day for 56 years    Types: Cigarettes    Quit date: 07/31/2013  . Smokeless tobacco: Former Neurosurgeon    Types: Chew     Comment: 09/08/2013 "stopped smoking cigarettes ~ 2 months ago; aien't chew in probably 10 yrs"  . Alcohol Use: No    Review  of Systems    Allergies  Niaspan  Home Medications   Prior to Admission medications   Medication Sig Start Date End Date Taking? Authorizing Provider  acetaminophen (TYLENOL) 325 MG tablet Take 2 tablets (650 mg total) by mouth every 6 (six) hours as needed for mild pain (or Fever >/= 101). 04/16/14  Yes Jeralyn BennettEzequiel Zamora, MD  albuterol (VENTOLIN HFA) 108 (90 BASE) MCG/ACT inhaler Inhale 2 puffs into the lungs every 6 (six) hours as needed for wheezing or shortness of breath. 06/15/14  Yes Donita BrooksWarren T Pickard, MD  amiodarone (PACERONE) 200 MG tablet Take 1 tablet (200 mg total) by mouth daily. 11/13/13  Yes Donita BrooksWarren T Pickard, MD  atorvastatin (LIPITOR) 40 MG  tablet Take 40 mg by mouth daily. 03/22/14  Yes Historical Provider, MD  budesonide-formoterol (SYMBICORT) 160-4.5 MCG/ACT inhaler Inhale 2 puffs into the lungs 2 (two) times daily. 02/24/14  Yes Donita BrooksWarren T Pickard, MD  diltiazem (TAZTIA XT) 360 MG 24 hr capsule Take 360 mg by mouth daily.   Yes Historical Provider, MD  esomeprazole (NEXIUM) 40 MG capsule Take 40 mg by mouth daily at 12 noon.   Yes Historical Provider, MD  furosemide (LASIX) 40 MG tablet TAKE 1 TABLET (40 MG TOTAL) BY MOUTH DAILY. 08/10/14  Yes Donita BrooksWarren T Pickard, MD  linagliptin (TRADJENTA) 5 MG TABS tablet Take 1 tablet (5 mg total) by mouth daily. 02/24/14  Yes Donita BrooksWarren T Pickard, MD  losartan (COZAAR) 50 MG tablet Take 1 tablet (50 mg total) by mouth daily. 07/23/14  Yes Donita BrooksWarren T Pickard, MD  metoprolol succinate (TOPROL-XL) 25 MG 24 hr tablet Take 75 mg by mouth daily.   Yes Historical Provider, MD  Rivaroxaban (XARELTO) 15 MG TABS tablet Take 1 tablet (15 mg total) by mouth daily. 04/23/14  Yes Donita BrooksWarren T Pickard, MD  sodium chloride (OCEAN) 0.65 % SOLN nasal spray Place 1 spray into both nostrils as needed for congestion.   Yes Historical Provider, MD  tiotropium (SPIRIVA HANDIHALER) 18 MCG inhalation capsule Place 1 capsule (18 mcg total) into inhaler and inhale daily. 02/24/14  Yes Donita BrooksWarren T Pickard, MD  diltiazem (CARDIZEM CD) 360 MG 24 hr capsule Take 360 mg by mouth daily.  12/21/13   Historical Provider, MD  furosemide (LASIX) 40 MG tablet Take 40 mg by mouth daily.     Historical Provider, MD   BP 116/63 mmHg  Pulse 71  Temp(Src) 99.4 F (37.4 C) (Oral)  Resp 23  Ht 5' 7.5" (1.715 m)  Wt 146 lb (66.225 kg)  BMI 22.52 kg/m2  SpO2 93% Physical Exam  Constitutional: He is oriented to person, place, and time. Vital signs are normal. He appears well-developed and well-nourished.  Non-toxic appearance. He does not appear ill. He appears distressed.  HENT:  Head: Normocephalic and atraumatic.  Nose: Nose normal.  Mouth/Throat:  Oropharynx is clear and moist. No oropharyngeal exudate.  Eyes: Conjunctivae and EOM are normal. Pupils are equal, round, and reactive to light. No scleral icterus.  Neck: Normal range of motion. Neck supple. No tracheal deviation, no edema, no erythema and normal range of motion present. No thyroid mass and no thyromegaly present.  Cardiovascular: Regular rhythm, S1 normal, S2 normal, normal heart sounds, intact distal pulses and normal pulses.  Exam reveals no gallop and no friction rub.   No murmur heard. Pulses:      Radial pulses are 2+ on the right side, and 2+ on the left side.       Dorsalis pedis  pulses are 2+ on the right side, and 2+ on the left side.  Tachycardic  Pulmonary/Chest: He is in respiratory distress. He has wheezes. He has no rhonchi. He has no rales.  Tachypnea noted, increased worker breathing, patient is in respiratory distress. There is wheezes and crackles heard throughout.  Abdominal: Soft. Normal appearance and bowel sounds are normal. He exhibits no distension, no ascites and no mass. There is no hepatosplenomegaly. There is no tenderness. There is no rebound, no guarding and no CVA tenderness.  Musculoskeletal: Normal range of motion. He exhibits edema. He exhibits no tenderness.  Lymphadenopathy:    He has no cervical adenopathy.  Neurological: He is alert and oriented to person, place, and time. He has normal strength. No cranial nerve deficit or sensory deficit. He exhibits normal muscle tone. GCS eye subscore is 4. GCS verbal subscore is 5. GCS motor subscore is 6.  Skin: Skin is warm and intact. No petechiae and no rash noted. He is diaphoretic. No erythema. No pallor.  Psychiatric: He has a normal mood and affect. His behavior is normal. Judgment normal.  Nursing note and vitals reviewed.   ED Course  Procedures (including critical care time) Labs Review Labs Reviewed  CBC - Abnormal; Notable for the following:    RBC 3.95 (*)    Hemoglobin 10.2 (*)     HCT 31.8 (*)    MCH 25.8 (*)    RDW 18.9 (*)    All other components within normal limits  PROTIME-INR - Abnormal; Notable for the following:    Prothrombin Time 23.6 (*)    INR 2.09 (*)    All other components within normal limits  I-STAT CHEM 8, ED - Abnormal; Notable for the following:    Sodium 131 (*)    BUN 38 (*)    Creatinine, Ser 2.70 (*)    Glucose, Bld 178 (*)    Calcium, Ion 1.12 (*)    Hemoglobin 11.9 (*)    HCT 35.0 (*)    All other components within normal limits  I-STAT CG4 LACTIC ACID, ED - Abnormal; Notable for the following:    Lactic Acid, Venous 3.02 (*)    All other components within normal limits  PRO B NATRIURETIC PEPTIDE  COMPREHENSIVE METABOLIC PANEL  LIPASE, BLOOD  I-STAT TROPOININ, ED  Rosezena SensorI-STAT TROPOININ, ED    Imaging Review Dg Chest Port 1 View  08/27/2014   CLINICAL DATA:  Shortness of breath. Decreased oxygen saturations. Shortness of breath onset over the last 2 days. 5 lb weight gain over a few days. Patient is on Lasix.  EXAM: PORTABLE CHEST - 1 VIEW  COMPARISON:  04/14/2014  FINDINGS: Cardiac enlargement without significant vascular congestion. Slight infiltration in the left lung base, less prominent than on previous study. Small bilateral pleural effusions. No pneumothorax. Calcification and torsion of the aorta.  IMPRESSION: Cardiac enlargement. Small bilateral pleural effusions with basilar infiltration or atelectasis on the left   Electronically Signed   By: Burman NievesWilliam  Stevens M.D.   On: 08/27/2014 06:51     EKG Interpretation   Date/Time:  Friday August 27 2014 06:14:39 EST Ventricular Rate:  72 PR Interval:  70 QRS Duration: 103 QT Interval:  429 QTC Calculation: 469 R Axis:   15 Text Interpretation:  Sinus rhythm No significant change since last  tracing Confirmed by Erroll Lunani, Kathlean Cinco Ayokunle [phone]) on 08/27/2014 6:40:02  AM      MDM   Final diagnoses:  Acute on chronic diastolic congestive heart  failure    Patient  presents emergency department for CHF exacerbation. Bedside ultrasound reveals B-lines. He is treated aggressively with nitro drip, FM O2, IV Lasix.  Oxygen improved to the low 90 percentile on 5 L nasal cannula. Patient states he is breathing much easier, tachypnea and accessory muscle use has resolved. Cardiology was paged for admission who except the patient.  Blood pressure has remained in the 110s currently.   CRITICAL CARE Performed by: Tomasita Crumble   Total critical care time: .  Critical care time was exclusive of separately billable procedures and treating other patients.  Critical care was necessary to treat or prevent imminent or life-threatening deterioration.  Critical care was time spent personally by me on the following activities: development of treatment plan with patient and/or surrogate as well as nursing, discussions with consultants, evaluation of patient's response to treatment, examination of patient, obtaining history from patient or surrogate, ordering and performing treatments and interventions, ordering and review of laboratory studies, ordering and review of radiographic studies, pulse oximetry and re-evaluation of patient's condition.     Tomasita Crumble, MD 08/27/14 [phone]

## 2014-08-27 NOTE — ED Notes (Signed)
Patient bp noted to be 110/60, nitro drip decreased to 6825mcg/min.  MD aware of same

## 2014-08-27 NOTE — H&P (Signed)
Physician History and Physical    Mark [name] MRN: [id number] DOB/AGE: 01/26/1946 68 y.o. Admit date: 08/27/2014  Primary Care Physician: Dr. Tanya NonesPickard Primary Cardiologist: Dr. Patty SermonsBrackbill  HPI: 68 yo with history of COPD, CKD, chronic diastolic CHF, and paroxysmal atrial fibrillation presented with weight gain, cough, and dyspnea.  He has had admits in the past for acute on chronic diastolic CHF and atrial fibrillation with RVR.  On initial arrival, He was hypoxic and is now on 5 L oxygen by nasal cannula.  Initial SBP > 200, he was started on NTG gtt and BP now in 120s.  He was given Lasix 80 mg IV x 1.  He feels better overall.  CXR showed possible left basilar infiltrate.   Patient states that he has gained 5 lbs over 2-3 days.  He has developed lower extremity edema.  No palpitations and he is in NSR today.  He has been taking his home Lasix 40 mg daily.  He has been coughing, occasional yellow sputum.  He became gradually more short of breath until last night when he was severely orthopneic, coughing, and unable to sleep so came to the ER.  No chest pain.   PMH: 1. HTN 2. Hyperlipidemia 3. CKD with h/o AKI 4. AAA s/p repair in 1/14 5. Normal Cardiolite in 1/14 6. Chronic diastolic CHF: TEE in 1/15 with EF 55-60%.   7. COPD: Was on home oxygen in the past but not recently.  8. Paroxysmal atrial fibrillation: Maintains NSR on amiodarone.  Also on Xarelto.  9. Type II diabetes  Review of systems complete and found to be negative unless listed above   Family History  Problem Relation Age of Onset  . Diabetes Mother   . Tuberculosis Mother   . Heart disease Father   . Heart attack Father   . Heart disease Son     Blood clot:  Arm    History   Social History  . Marital Status: Married    Spouse Name: [name]    Number of Children: 4  . Years of Education: N/A   Occupational History  . Works PT as a Education administratorpainter    Social History Main Topics  . Smoking status:  Former Smoker -- 1.00 packs/day for 56 years    Types: Cigarettes    Quit date: 07/31/2013  . Smokeless tobacco: Former NeurosurgeonUser    Types: Chew     Comment: 09/08/2013 "stopped smoking cigarettes ~ 2 months ago; aien't chew in probably 10 yrs"  . Alcohol Use: No  . Drug Use: No  . Sexual Activity: Not Currently   Other Topics Concern  . Not on file   Social History Narrative   Married.  Lives with wife.  Ambulates without assistance.      (Not in a hospital admission)  Physical Exam: Blood pressure 122/62, pulse 71, temperature 99.4 F (37.4 C), temperature source Oral, resp. rate 28, height 5' 7.5" (1.715 m), weight 146 lb (66.225 kg), SpO2 93 %.  General: NAD Neck: JVP 12 cm, no thyromegaly or thyroid nodule.  Lungs: Rhonchi throughout, diminished at bases. CV: Distant heart sounds.  Nondisplaced PMI.  Heart regular S1/S2, no S3/S4, no murmur.  1+ edema to knees.  No carotid bruit.  Unable to palpate pulses with edema.   Abdomen: Soft, nontender, no hepatosplenomegaly, no distention.  Skin: Intact without lesions or rashes.  Neurologic: Alert and oriented x 3.  Psych: Normal affect. Extremities: No clubbing or cyanosis.  HEENT:  Normal.   Labs:   Lab Results  Component Value Date   WBC 9.5 08/27/2014   HGB 11.9* 08/27/2014   HCT 35.0* 08/27/2014   MCV 80.5 08/27/2014   PLT 208 08/27/2014    Recent Labs Lab 08/27/14 0604 08/27/14 0610  NA 135* 131*  K 4.8 4.5  CL 95* 102  CO2 22  --   BUN 32* 38*  CREATININE 2.52* 2.70*  CALCIUM 9.4  --   PROT 7.3  --   BILITOT 0.4  --   ALKPHOS 76  --   ALT 326*  --   AST 305*  --   GLUCOSE 172* 178*  BNP 7267 Lactate 3 TnI 0.02  Radiology: - CXR: small bilateral effusion with left basilar infiltrate  EKG:NSR, normal  ASSESSMENT AND PLAN: [age] yo with history of COPD, CKD, chronic diastolic CHF, and paroxysmal atrial fibrillation presented with weight gain, cough, and dyspnea. 1. Acute on chronic diastolic CHF:  Patient is clearly volume overloaded on exam with edema and elevated JVP.  He has had weight gain at home.  Uncertain trigger: no evidence for ACS, has been taking his meds.  Possibly triggered by pre-existing PNA/COPD exacerbation.  - Would cycle cardiac enzymes, rule out ACS. - Lasix 80 mg IV bid.  Will need to follow creatinine carefully with history of CKD.  - Continue NTG gtt for now.  2. AECOPD/possible PNA: Cannot rule out PNA on CXR.  Has had productive cough, h/o COPD.  WBCs normal, temp 99.  I am going to treat him for PNA/COPD exacerbation with ceftriaxone/doxycycline + prednisone and atrovent nebs.  3. Atrial fibrillation: Paroxysmal.  Currently in NSR on amiodarone and Xarelto.  LFTs elevated.  This could be due to hepatic congestion.  Will continue amiodarone for now but follow LFTs and if they rise, will need to stop amiodarone.  4. CKD: Creatinine at baseline.  Will have to follow closely with diuresis.  5. Diabetes: Cover with sliding scale.   Signed: Marca AnconaDalton McLean 08/27/2014, 7:30 AM

## 2014-08-27 NOTE — ED Notes (Signed)
Pt brought to trauma a from triage for sats noted in 40's pt calm and cooperated, abc intact, speaking with ease on arrival to room. SOB onset over the last 2 days, has had recent 5 pound weight gain over the last few days. Pt is on lasix

## 2014-08-27 NOTE — ED Notes (Signed)
Dr.McLean at bedside  

## 2014-08-28 DIAGNOSIS — J9621 Acute and chronic respiratory failure with hypoxia: Secondary | ICD-10-CM

## 2014-08-28 LAB — BASIC METABOLIC PANEL
ANION GAP: 16 — AB (ref 5–15)
BUN: 42 mg/dL — ABNORMAL HIGH (ref 6–23)
CALCIUM: 9.7 mg/dL (ref 8.4–10.5)
CO2: 25 mEq/L (ref 19–32)
Chloride: 95 mEq/L — ABNORMAL LOW (ref 96–112)
Creatinine, Ser: 2.69 mg/dL — ABNORMAL HIGH (ref 0.50–1.35)
GFR calc Af Amer: 26 mL/min — ABNORMAL LOW (ref >90)
GFR calc non Af Amer: 22 mL/min — ABNORMAL LOW (ref >90)
Glucose, Bld: 134 mg/dL — ABNORMAL HIGH (ref 70–99)
POTASSIUM: 5.2 meq/L (ref 3.7–5.3)
SODIUM: 136 meq/L — AB (ref 137–147)

## 2014-08-28 LAB — GLUCOSE, CAPILLARY
Glucose-Capillary: 108 mg/dL — ABNORMAL HIGH (ref 70–99)
Glucose-Capillary: 120 mg/dL — ABNORMAL HIGH (ref 70–99)
Glucose-Capillary: 152 mg/dL — ABNORMAL HIGH (ref 70–99)
Glucose-Capillary: 160 mg/dL — ABNORMAL HIGH (ref 70–99)

## 2014-08-28 LAB — TROPONIN I

## 2014-08-28 MED ORDER — PREDNISONE 20 MG PO TABS
40.0000 mg | ORAL_TABLET | Freq: Every day | ORAL | Status: DC
  Administered 2014-08-29 – 2014-08-31 (×3): 40 mg via ORAL
  Filled 2014-08-28 (×4): qty 2

## 2014-08-28 NOTE — Progress Notes (Addendum)
Subjective:   68 yo with history of COPD, CKD, chronic diastolic CHF, and paroxysmal atrial fibrillation admitted from [facility] Clinic on 12/11 with recurrent HF and probable LLL PNA.   Weight down 2 pounds with IV lasix. No bloodwork this am. Still with nonproductive cough. Remains in NSR.      Intake/Output Summary (Last 24 hours) at 08/28/14 1224 Last data filed at 08/28/14 1000  Gross per 24 hour  Intake 983.15 ml  Output   1400 ml  Net -416.85 ml    Current meds: . amiodarone  200 mg Oral Daily  . atorvastatin  40 mg Oral Daily  . cefTRIAXone (ROCEPHIN)  IV  1 g Intravenous Q24H  . diltiazem  360 mg Oral Daily  . doxycycline  100 mg Oral Q12H  . furosemide  80 mg Intravenous BID  . insulin aspart  0-15 Units Subcutaneous TID WC  . insulin aspart  0-5 Units Subcutaneous QHS  . ipratropium  0.5 mg Nebulization Q6H  . linagliptin  5 mg Oral Daily  . metoprolol succinate  75 mg Oral Daily  . pantoprazole  40 mg Oral Daily  . predniSONE  60 mg Oral Q breakfast  . Rivaroxaban  15 mg Oral Daily  . sodium chloride  3 mL Intravenous Q12H   Infusions: . nitroGLYCERIN 0 mcg/min (08/27/14 2206)     Objective:  Blood pressure 138/68, pulse 59, temperature 98 F (36.7 C), temperature source Oral, resp. rate 15, height 5' 7.5" (1.715 m), weight 65.7 kg (144 lb 13.5 oz), SpO2 100 %. Weight change: -0.525 kg (-1 lb 2.5 oz)  Physical Exam: Blood pressure 122/62, pulse 71, temperature 99.4 F (37.4 C), temperature source Oral, resp. rate 28, height 5' 7.5" (1.715 m), weight 146 lb (66.225 kg), SpO2 93 %.  General: NAD Neck: JVP 1to jaw , no thyromegaly or thyroid nodule.  Lungs: Rhonchi throughout, diminished at bases. CV: Distant heart sounds. Nondisplaced PMI. Heart regular S1/S2, no S3/S4, no murmur. 1+ edema to knees. No carotid bruit. Unable to palpate pulses with edema.  Abdomen: Soft, nontender, no hepatosplenomegaly, no distention.  Skin: Intact without lesions  or rashes.  Neurologic: Alert and oriented x 3.  Psych: Normal affect. Extremities: No clubbing or cyanosis.  HEENT: Normal.  Telemetry:  SR  Lab Results: Basic Metabolic Panel:  Recent Labs Lab 08/27/14 0604 08/27/14 0610  NA 135* 131*  K 4.8 4.5  CL 95* 102  CO2 22  --   GLUCOSE 172* 178*  BUN 32* 38*  CREATININE 2.52* 2.70*  CALCIUM 9.4  --    Liver Function Tests:  Recent Labs Lab 08/27/14 0604  AST 305*  ALT 326*  ALKPHOS 76  BILITOT 0.4  PROT 7.3  ALBUMIN 3.0*    Recent Labs Lab 08/27/14 0604  LIPASE 21   No results for input(s): AMMONIA in the last 168 hours. CBC:  Recent Labs Lab 08/27/14 0604 08/27/14 0610  WBC 9.5  --   HGB 10.2* 11.9*  HCT 31.8* 35.0*  MCV 80.5  --   PLT 208  --    Cardiac Enzymes:  Recent Labs Lab 08/27/14 1255 08/27/14 1843 08/28/14 0120  TROPONINI <0.30 <0.30 <0.30   BNP: Invalid input(s): POCBNP CBG:  Recent Labs Lab 08/27/14 1123 08/27/14 1723 08/27/14 2157 08/28/14 0804  GLUCAP 133* 180* 167* 108*   Microbiology: Lab Results  Component Value Date   CULT NO GROWTH [facility] 04/14/2014   CULT  04/14/2014  NO GROWTH 5 DAYS Performed at [facility]   CULT  04/14/2014    NO GROWTH 5 DAYS Performed at [facility]   CULT  08/11/2013    NO GROWTH 5 DAYS Performed at [facility]   No results for input(s): CULT, SDES in the last 168 hours.  Imaging: Dg Chest Port 1 View  08/27/2014   CLINICAL DATA:  Shortness of breath. Decreased oxygen saturations. Shortness of breath onset over the last 2 days. 5 lb weight gain over a few days. Patient is on Lasix.  EXAM: PORTABLE CHEST - 1 VIEW  COMPARISON:  04/14/2014  FINDINGS: Cardiac enlargement without significant vascular congestion. Slight infiltration in the left lung base, less prominent than on previous study. Small bilateral pleural effusions. No pneumothorax. Calcification and torsion of the  aorta.  IMPRESSION: Cardiac enlargement. Small bilateral pleural effusions with basilar infiltration or atelectasis on the left   Electronically Signed   By: Burman NievesWilliam  Stevens M.D.   On: 08/27/2014 06:51     ASSESSMENT:   1. A/c diastolic HF 2. Probable RLL PNA 3. CKD, stage IV (baseline Cr 2.7) 4. PAF 5. HTN 6. DM2  PLAN/DISCUSSION:   I think main issue is HF but also may have acute bronchitis. Continue diuresis. Watch renal function closely. Treat with abx. Continue prednisone per Dr. Shirlee LatchMclean.     LOS: 1 day  Arvilla Meresaniel Bensimhon, MD 08/28/2014, 12:24 PM

## 2014-08-29 DIAGNOSIS — J441 Chronic obstructive pulmonary disease with (acute) exacerbation: Secondary | ICD-10-CM

## 2014-08-29 DIAGNOSIS — N184 Chronic kidney disease, stage 4 (severe): Secondary | ICD-10-CM

## 2014-08-29 LAB — GLUCOSE, CAPILLARY
GLUCOSE-CAPILLARY: 184 mg/dL — AB (ref 70–99)
Glucose-Capillary: 125 mg/dL — ABNORMAL HIGH (ref 70–99)
Glucose-Capillary: 119 mg/dL — ABNORMAL HIGH (ref 70–99)
Glucose-Capillary: 138 mg/dL — ABNORMAL HIGH (ref 70–99)

## 2014-08-29 LAB — COMPREHENSIVE METABOLIC PANEL
ALT: 419 U/L — AB (ref 0–53)
AST: 299 U/L — AB (ref 0–37)
Albumin: 2.5 g/dL — ABNORMAL LOW (ref 3.5–5.2)
Alkaline Phosphatase: 67 U/L (ref 39–117)
Anion gap: 14 (ref 5–15)
BUN: 47 mg/dL — ABNORMAL HIGH (ref 6–23)
CALCIUM: 9 mg/dL (ref 8.4–10.5)
CHLORIDE: 96 meq/L (ref 96–112)
CO2: 26 mEq/L (ref 19–32)
Creatinine, Ser: 2.84 mg/dL — ABNORMAL HIGH (ref 0.50–1.35)
GFR calc Af Amer: 24 mL/min — ABNORMAL LOW (ref >90)
GFR, EST NON AFRICAN AMERICAN: 21 mL/min — AB (ref >90)
Glucose, Bld: 134 mg/dL — ABNORMAL HIGH (ref 70–99)
Potassium: 4.3 mEq/L (ref 3.7–5.3)
SODIUM: 136 meq/L — AB (ref 137–147)
Total Bilirubin: 0.2 mg/dL — ABNORMAL LOW (ref 0.3–1.2)
Total Protein: 6 g/dL (ref 6.0–8.3)

## 2014-08-29 NOTE — Progress Notes (Signed)
Patient Name: Mark [name] Date of Encounter: 08/29/2014  Active Problems:   Acute on chronic diastolic CHF (congestive heart failure)   Length of Stay: 2  SUBJECTIVE  Improved dyspnea, but still mildly orthopneic Very slight increase in creatinine Good UO (-2.5L since admission), weight down 3.5 lb.  CURRENT MEDS . amiodarone  200 mg Oral Daily  . atorvastatin  40 mg Oral Daily  . cefTRIAXone (ROCEPHIN)  IV  1 g Intravenous Q24H  . diltiazem  360 mg Oral Daily  . doxycycline  100 mg Oral Q12H  . furosemide  80 mg Intravenous BID  . insulin aspart  0-15 Units Subcutaneous TID WC  . insulin aspart  0-5 Units Subcutaneous QHS  . ipratropium  0.5 mg Nebulization Q6H  . linagliptin  5 mg Oral Daily  . metoprolol succinate  75 mg Oral Daily  . pantoprazole  40 mg Oral Daily  . predniSONE  40 mg Oral Q breakfast  . Rivaroxaban  15 mg Oral Daily  . sodium chloride  3 mL Intravenous Q12H    OBJECTIVE   Intake/Output Summary (Last 24 hours) at 08/29/14 1015 Last data filed at 08/29/14 0801  Gross per 24 hour  Intake    293 ml  Output   2305 ml  Net  -2012 ml   Filed Weights   08/27/14 0550 08/28/14 0320 08/29/14 0400  Weight: 146 lb (66.225 kg) 144 lb 13.5 oz (65.7 kg) 142 lb 6.7 oz (64.6 kg)    PHYSICAL EXAM Filed Vitals:   08/28/14 2300 08/28/14 2330 08/29/14 0400 08/29/14 0803  BP:  126/68 130/62 115/66  Pulse:  62 68 66  Temp:  97.9 F (36.6 C) 98.2 F (36.8 C) 98.4 F (36.9 C)  TempSrc:  Oral Oral Axillary  Resp:  12 14   Height:   5' 7.5" (1.715 m)   Weight:   142 lb 6.7 oz (64.6 kg)   SpO2: 98% 97% 94% 97%   General: Alert, oriented x3, no distress Head: no evidence of trauma, PERRL, EOMI, no exophtalmos or lid lag, no myxedema, no xanthelasma; normal ears, nose and oropharynx Neck: 6-8 cm elevated jugular venous pulsations and prompt hepatojugular reflux; brisk carotid pulses without delay and no carotid bruits Chest: clear to auscultation, no  signs of consolidation by percussion or palpation, normal fremitus, symmetrical and full respiratory excursions Cardiovascular: normal position and quality of the apical impulse, regular rhythm, normal first and second heart sounds, no rubs or gallops, no murmur Abdomen: no tenderness or distention, no masses by palpation, no abnormal pulsatility or arterial bruits, normal bowel sounds, no hepatosplenomegaly Extremities: no clubbing, cyanosis, 2+ bilateral ankle pitting edema L>R edema; 2+ radial, ulnar and brachial pulses bilaterally; 2+ right femoral, posterior tibial and dorsalis pedis pulses; 2+ left femoral, posterior tibial and dorsalis pedis pulses; no subclavian or femoral bruits Neurological: grossly nonfocal  LABS  CBC  Recent Labs  08/27/14 0604 08/27/14 0610  WBC 9.5  --   HGB 10.2* 11.9*  HCT 31.8* 35.0*  MCV 80.5  --   PLT 208  --    Basic Metabolic Panel  Recent Labs  08/28/14 1528 08/29/14 0302  NA 136* 136*  K 5.2 4.3  CL 95* 96  CO2 25 26  GLUCOSE 134* 134*  BUN 42* 47*  CREATININE 2.69* 2.84*  CALCIUM 9.7 9.0   Liver Function Tests  Recent Labs  08/27/14 0604 08/29/14 0302  AST 305* 299*  ALT 326* 419*  ALKPHOS 76  67  BILITOT 0.4 0.2*  PROT 7.3 6.0  ALBUMIN 3.0* 2.5*    Recent Labs  08/27/14 0604  LIPASE 21   Cardiac Enzymes  Recent Labs  08/27/14 1255 08/27/14 1843 08/28/14 0120  TROPONINI <0.30 <0.30 <0.30   BNP Invalid input(s): POCBNP D-Dimer No results for input(s): DDIMER in the last 72 hours. Hemoglobin A1C No results for input(s): HGBA1C in the last 72 hours. Fasting Lipid Panel No results for input(s): CHOL, HDL, LDLCALC, TRIG, CHOLHDL, LDLDIRECT in the last 72 hours. Thyroid Function Tests  Recent Labs  08/27/14 1300  TSH 1.790    Radiology Studies Imaging results have been reviewed and No results found.  TELE NSR  ECG NSR  ASSESSMENT AND PLAN  Improved acute exacerbation of chronic heart failure,  still with signs and symptoms of hypervolemia. Continue IV diuretics. Moderate to severe chronic kidney disease, creat around upper limit of last several months readings - need to monitor carefully Chronic lung disease lowers threshold for dyspnea with HF episodes as well. Evidence for pneumonia is not very compelling. Switch to PO antibiotics in AM. Check CXR in AM.   Thurmon FairMihai Richele Strand, MD, [facility] [phone] office [phone] pager 08/29/2014 10:15 AM

## 2014-08-30 ENCOUNTER — Inpatient Hospital Stay (HOSPITAL_COMMUNITY): Payer: Medicare Other

## 2014-08-30 LAB — BASIC METABOLIC PANEL
ANION GAP: 12 (ref 5–15)
BUN: 55 mg/dL — ABNORMAL HIGH (ref 6–23)
CHLORIDE: 98 meq/L (ref 96–112)
CO2: 29 mEq/L (ref 19–32)
Calcium: 9.1 mg/dL (ref 8.4–10.5)
Creatinine, Ser: 2.83 mg/dL — ABNORMAL HIGH (ref 0.50–1.35)
GFR, EST AFRICAN AMERICAN: 24 mL/min — AB (ref >90)
GFR, EST NON AFRICAN AMERICAN: 21 mL/min — AB (ref >90)
Glucose, Bld: 133 mg/dL — ABNORMAL HIGH (ref 70–99)
POTASSIUM: 4.2 meq/L (ref 3.7–5.3)
SODIUM: 139 meq/L (ref 137–147)

## 2014-08-30 LAB — GLUCOSE, CAPILLARY
GLUCOSE-CAPILLARY: 217 mg/dL — AB (ref 70–99)
Glucose-Capillary: 109 mg/dL — ABNORMAL HIGH (ref 70–99)
Glucose-Capillary: 173 mg/dL — ABNORMAL HIGH (ref 70–99)
Glucose-Capillary: 252 mg/dL — ABNORMAL HIGH (ref 70–99)

## 2014-08-30 LAB — PRO B NATRIURETIC PEPTIDE: Pro B Natriuretic peptide (BNP): 5324 pg/mL — ABNORMAL HIGH (ref 0–125)

## 2014-08-30 MED ORDER — BUDESONIDE-FORMOTEROL FUMARATE 160-4.5 MCG/ACT IN AERO
2.0000 | INHALATION_SPRAY | Freq: Two times a day (BID) | RESPIRATORY_TRACT | Status: DC
Start: 2014-08-30 — End: 2014-09-01
  Administered 2014-08-30 – 2014-09-01 (×5): 2 via RESPIRATORY_TRACT
  Filled 2014-08-30: qty 6

## 2014-08-30 MED ORDER — TIOTROPIUM BROMIDE MONOHYDRATE 18 MCG IN CAPS
18.0000 ug | ORAL_CAPSULE | Freq: Every day | RESPIRATORY_TRACT | Status: DC
  Administered 2014-08-30 – 2014-09-01 (×3): 18 ug via RESPIRATORY_TRACT
  Filled 2014-08-30: qty 5

## 2014-08-30 MED ORDER — GUAIFENESIN ER 600 MG PO TB12
1200.0000 mg | ORAL_TABLET | Freq: Two times a day (BID) | ORAL | Status: DC
  Administered 2014-08-30 – 2014-09-01 (×5): 1200 mg via ORAL
  Filled 2014-08-30 (×7): qty 2

## 2014-08-30 NOTE — Progress Notes (Signed)
   08/30/14 0900  Clinical Encounter Type  Visited With Patient;Health care provider  Visit Type Initial  Referral From Nurse  Advance Directives (For Healthcare)  Does patient have an advance directive? No  Would patient like information on creating an advanced directive? No - patient declined information   Chaplain was referred to patient via spiritual care consult. Consult indicated patient was interested in creating or updating an advanced directive. Chaplain visited with patient briefly. When Chaplain asked patient about advanced directive, patient said he had no interest at this time. Chaplain will continue to provide emotional and spiritual support for patient as needed. [place]ston NeighborStrother, Ramisa Duman R, Chaplain  9:51 AM

## 2014-08-30 NOTE — Progress Notes (Signed)
Subjective:  Coughing, nonproductive; brochitic sounding  Objective:   Vital Signs in the last 24 hours: Temp:  [97.3 F (36.3 C)-98.3 F (36.8 C)] 97.4 F (36.3 C) (12/14 0754) Pulse Rate:  [57-74] 65 (12/14 0930) Resp:  [16-18] 18 (12/14 0754) BP: (134-158)/(58-79) 140/65 mmHg (12/14 0930) SpO2:  [80 %-100 %] 98 % (12/14 0930) Weight:  [139 lb (63.05 kg)] 139 lb (63.05 kg) (12/14 0300)  Intake/Output from previous day: 12/13 0701 - 12/14 0700 In: 530 [P.O.:480; IV Piggyback:50] Out: 3810 [Urine:3810]   I/O since admission: -6365 Weight decreased from 146 to 139.  Medications: . amiodarone  200 mg Oral Daily  . atorvastatin  40 mg Oral Daily  . budesonide-formoterol  2 puff Inhalation BID  . cefTRIAXone (ROCEPHIN)  IV  1 g Intravenous Q24H  . diltiazem  360 mg Oral Daily  . doxycycline  100 mg Oral Q12H  . furosemide  80 mg Intravenous BID  . insulin aspart  0-15 Units Subcutaneous TID WC  . insulin aspart  0-5 Units Subcutaneous QHS  . linagliptin  5 mg Oral Daily  . metoprolol succinate  75 mg Oral Daily  . pantoprazole  40 mg Oral Daily  . predniSONE  40 mg Oral Q breakfast  . Rivaroxaban  15 mg Oral Daily  . sodium chloride  3 mL Intravenous Q12H  . tiotropium  18 mcg Inhalation Daily    . nitroGLYCERIN 0 mcg/min (08/27/14 2206)    Physical Exam:   General appearance: alert and cooperative Neck: no adenopathy, supple, symmetrical, trachea midline and thyroid not enlarged, symmetric, no tenderness/mass/nodules Lungs: diffuse rhonchi bilaterally Heart: regular rate and rhythm; 1/6 sem Abdomen: soft, non-tender; bowel sounds normal; no masses,  no organomegaly Extremities: no edema, redness or tenderness in the calves or thighs Neurologic: Grossly normal   Rate: 68  Rhythm: normal sinus rhythm  Lab Results:   Recent Labs  08/29/14 0302 08/30/14 0248  NA 136* 139  K 4.3 4.2  CL 96 98  CO2 26 29  GLUCOSE 134* 133*  BUN 47* 55*  CREATININE  2.84* 2.83*   CBC Latest Ref Rng 08/27/2014 08/27/2014 04/15/2014  WBC 4.0 - 10.5 K/uL - 9.5 10.4  Hemoglobin 13.0 - 17.0 g/dL 11.9(L) 10.2(L) 9.4(L)  Hematocrit 39.0 - 52.0 % 35.0(L) 31.8(L) 29.2(L)  Platelets 150 - 400 K/uL - 208 266     Recent Labs  08/27/14 1843 08/28/14 0120  TROPONINI <0.30 <0.30    Hepatic Function Panel  Recent Labs  08/29/14 0302  PROT 6.0  ALBUMIN 2.5*  AST 299*  ALT 419*  ALKPHOS 67  BILITOT 0.2*   No results for input(s): INR in the last 72 hours. BNP (last 3 results)  Recent Labs  04/14/14 1355 08/27/14 0604 08/30/14 0248  PROBNP 4504.0* 7267.0* 5324.0*    Lipid Panel     Component Value Date/Time   CHOL 119 07/20/2014 1143   TRIG 80 07/20/2014 1143   HDL 54 07/20/2014 1143   CHOLHDL 2.2 07/20/2014 1143   VLDL 16 07/20/2014 1143   LDLCALC 49 07/20/2014 1143      Imaging:  Dg Chest 2 View  08/30/2014   CLINICAL DATA:  Congestive heart failure  EXAM: CHEST  2 VIEW  COMPARISON:  08/27/2014  FINDINGS: Normal heart size. Stable mild aortic tortuosity. Pulmonary hyperinflation with flattening of the diaphragm correlating with patient's history of COPD. Interstitial coarsening at the bases noted on the previous study has significantly improved, now likely at  baseline. Possible trace pleural effusions; No pneumonia or edema. No pneumothorax.  IMPRESSION: COPD without acute superimposed finding.   Electronically Signed   By: Tiburcio PeaJonathan  Watts M.D.   On: 08/30/2014 05:39      Assessment/Plan:   Active Problems:   Acute on chronic diastolic CHF (congestive heart failure)   CKD (chronic kidney disease)   COPD with acute exacerbation  1. Acute on chronic diastolic CHF 2. COPD exacerbation no infiltrate on CXR 3. Increased LFT's; hold statin 4. CKD stage 4; cr 2.83, baseline 2.7 5 Xarelto anticoagulation; will need to switch if Cr Clearance <15  Maintaining NSR. Excellent diuresis with 7 lbs weight loss. Keep IV antibiotics today  and change to oral levoquin tomorrow with renal dosing.  Hold atorvastatin with increased LFT's. Add guafenisin to improve congestion. F/U labs in am. Day 2 of prednisone.    Lennette Biharihomas A. Kelly, MD, [facility] Center[facility]ACC 08/30/2014, 10:55 AM

## 2014-08-30 NOTE — Clinical Documentation Improvement (Signed)
Possible Clinical Conditions?  Acute Respiratory Failure Acute on Chronic Respiratory Failure Chronic Respiratory Failure Other Condition Cannot Clinically Determine   Supporting Information:(As per notes) "On initial arrival, He was hypoxic and is now on 5 L oxygen by nasal cannula.  O2 Sats :90 Risk Factors:"CHF, PNA & COPD exacerbation" Signs & Symptoms:"Pulmonary/Chest: He is in respiratory distress. He has wheezes. He has no rhonchi. He has no rales."  "Tachypnea noted, increased worker breathing, patient is in respiratory distress. There is wheezes and crackles heard throughout." Diagnostics:CXR 08/27/14 IMPRESSION: Cardiac enlargement. Small bilateral pleural effusions with basilar infiltration or atelectasis on the left  Treatment: O2 @ 5L/m via [place]  Thank You, Nevin BloodgoodJoan B Nolen Lindamood, RN, BSN, CCDS,Clinical Documentation Specialist:  [phone]  [phone]=[employer]- Health Information Management

## 2014-08-30 NOTE — Progress Notes (Signed)
Rt added pt's home meds to list and removed atrovent.  Pt is not wheezing at this time, and has no SOB.  RT will continue to monitor.  Rt explained tom pt that breathing txs do not help cough, congestion or fluid issues.

## 2014-08-31 DIAGNOSIS — D649 Anemia, unspecified: Secondary | ICD-10-CM

## 2014-08-31 LAB — CBC
HEMATOCRIT: 29.9 % — AB (ref 39.0–52.0)
HEMOGLOBIN: 9.5 g/dL — AB (ref 13.0–17.0)
MCH: 25.4 pg — ABNORMAL LOW (ref 26.0–34.0)
MCHC: 31.8 g/dL (ref 30.0–36.0)
MCV: 79.9 fL (ref 78.0–100.0)
Platelets: 245 10*3/uL (ref 150–400)
RBC: 3.74 MIL/uL — ABNORMAL LOW (ref 4.22–5.81)
RDW: 18 % — ABNORMAL HIGH (ref 11.5–15.5)
WBC: 7.7 10*3/uL (ref 4.0–10.5)

## 2014-08-31 LAB — BASIC METABOLIC PANEL
Anion gap: 14 (ref 5–15)
BUN: 60 mg/dL — AB (ref 6–23)
CHLORIDE: 95 meq/L — AB (ref 96–112)
CO2: 29 mEq/L (ref 19–32)
CREATININE: 3.01 mg/dL — AB (ref 0.50–1.35)
Calcium: 9.2 mg/dL (ref 8.4–10.5)
GFR calc non Af Amer: 20 mL/min — ABNORMAL LOW (ref >90)
GFR, EST AFRICAN AMERICAN: 23 mL/min — AB (ref >90)
GLUCOSE: 142 mg/dL — AB (ref 70–99)
Potassium: 4.1 mEq/L (ref 3.7–5.3)
Sodium: 138 mEq/L (ref 137–147)

## 2014-08-31 LAB — GLUCOSE, CAPILLARY
GLUCOSE-CAPILLARY: 112 mg/dL — AB (ref 70–99)
GLUCOSE-CAPILLARY: 163 mg/dL — AB (ref 70–99)
GLUCOSE-CAPILLARY: 174 mg/dL — AB (ref 70–99)
Glucose-Capillary: 188 mg/dL — ABNORMAL HIGH (ref 70–99)

## 2014-08-31 LAB — PRO B NATRIURETIC PEPTIDE: Pro B Natriuretic peptide (BNP): 4388 pg/mL — ABNORMAL HIGH (ref 0–125)

## 2014-08-31 MED ORDER — PREDNISONE 20 MG PO TABS
20.0000 mg | ORAL_TABLET | Freq: Every day | ORAL | Status: DC
  Administered 2014-09-01: 20 mg via ORAL
  Filled 2014-08-31 (×2): qty 1

## 2014-08-31 MED ORDER — FUROSEMIDE 80 MG PO TABS
80.0000 mg | ORAL_TABLET | Freq: Every day | ORAL | Status: DC
  Administered 2014-09-01: 80 mg via ORAL
  Filled 2014-08-31: qty 1

## 2014-08-31 MED ORDER — LEVOFLOXACIN 750 MG PO TABS
750.0000 mg | ORAL_TABLET | ORAL | Status: DC
  Administered 2014-08-31: 750 mg via ORAL
  Filled 2014-08-31 (×2): qty 1

## 2014-08-31 MED ORDER — PREDNISONE 10 MG PO TABS: 10.0000 mg | ORAL_TABLET | Freq: Every day | ORAL | Status: DC

## 2014-08-31 NOTE — Progress Notes (Signed)
Report called to receiving Rn [id number]. Will transfer via WC. Pt with no complaints at the current time.

## 2014-08-31 NOTE — Progress Notes (Signed)
Subjective:  Breathing better; cough has improved  Objective:   Vital Signs in the last 24 hours: Temp:  [97.8 F (36.6 C)-98.4 F (36.9 C)] 98.3 F (36.8 C) (12/15 0844) Pulse Rate:  [42-68] 42 (12/15 0844) Resp:  [18-20] 18 (12/15 0331) BP: (120-152)/(65-79) 129/77 mmHg (12/15 0844) SpO2:  [95 %-100 %] 99 % (12/15 0844) Weight:  [137 lb 5.6 oz (62.3 kg)] 137 lb 5.6 oz (62.3 kg) (12/15 0339)  Intake/Output from previous day: 12/14 0701 - 12/15 0700 In: 513 [P.O.:460; I.V.:3; IV Piggyback:50] Out: 3025 [Urine:3025]   I/O since admission: -7570 with -2512 yesterday Weight decreased from 146 to 137; 9 lb weight loss.  Medications: . amiodarone  200 mg Oral Daily  . budesonide-formoterol  2 puff Inhalation BID  . cefTRIAXone (ROCEPHIN)  IV  1 g Intravenous Q24H  . diltiazem  360 mg Oral Daily  . doxycycline  100 mg Oral Q12H  . furosemide  80 mg Intravenous BID  . guaiFENesin  1,200 mg Oral BID  . insulin aspart  0-15 Units Subcutaneous TID WC  . insulin aspart  0-5 Units Subcutaneous QHS  . linagliptin  5 mg Oral Daily  . metoprolol succinate  75 mg Oral Daily  . pantoprazole  40 mg Oral Daily  . predniSONE  40 mg Oral Q breakfast  . Rivaroxaban  15 mg Oral Daily  . sodium chloride  3 mL Intravenous Q12H  . tiotropium  18 mcg Inhalation Daily    . nitroGLYCERIN 0 mcg/min (08/27/14 2206)    Physical Exam:   General appearance: alert and cooperative Neck: no adenopathy, supple, symmetrical, trachea midline and thyroid not enlarged, symmetric, no tenderness/mass/nodules Lungs: diffuse rhonchi bilaterally Heart: regular rate and rhythm; 1/6 sem Abdomen: soft, non-tender; bowel sounds normal; no masses,  no organomegaly Extremities: no edema, redness or tenderness in the calves or thighs Neurologic: Grossly normal   Rate: 67  Rhythm: normal sinus rhythm  Lab Results:   Recent Labs  08/30/14 0248 08/31/14 0328  NA 139 138  K 4.2 4.1  CL 98 95*  CO2 29  29  GLUCOSE 133* 142*  BUN 55* 60*  CREATININE 2.83* 3.01*   CBC Latest Ref Rng 08/31/2014 08/27/2014 08/27/2014  WBC 4.0 - 10.5 K/uL 7.7 - 9.5  Hemoglobin 13.0 - 17.0 g/dL 7.8(G9.5(L) 11.9(L) 10.2(L)  Hematocrit 39.0 - 52.0 % 29.9(L) 35.0(L) 31.8(L)  Platelets 150 - 400 K/uL 245 - 208    No results for input(s): TROPONINI in the last 72 hours.  Invalid input(s): CK, MB  Hepatic Function Panel  Recent Labs  08/29/14 0302  PROT 6.0  ALBUMIN 2.5*  AST 299*  ALT 419*  ALKPHOS 67  BILITOT 0.2*   No results for input(s): INR in the last 72 hours. BNP (last 3 results)  Recent Labs  08/27/14 0604 08/30/14 0248 08/31/14 0328  PROBNP 7267.0* 5324.0* 4388.0*    Lipid Panel     Component Value Date/Time   CHOL 119 07/20/2014 1143   TRIG 80 07/20/2014 1143   HDL 54 07/20/2014 1143   CHOLHDL 2.2 07/20/2014 1143   VLDL 16 07/20/2014 1143   LDLCALC 49 07/20/2014 1143      Imaging:  Dg Chest 2 View  08/30/2014   CLINICAL DATA:  Congestive heart failure  EXAM: CHEST  2 VIEW  COMPARISON:  08/27/2014  FINDINGS: Normal heart size. Stable mild aortic tortuosity. Pulmonary hyperinflation with flattening of the diaphragm correlating with patient's history of COPD. Interstitial coarsening  at the bases noted on the previous study has significantly improved, now likely at baseline. Possible trace pleural effusions; No pneumonia or edema. No pneumothorax.  IMPRESSION: COPD without acute superimposed finding.   Electronically Signed   By: Tiburcio PeaJonathan  Watts M.D.   On: 08/30/2014 05:39    Echo 08/27/14 Study Conclusions  - Left ventricle: The cavity size was normal. Wall thickness was normal. The estimated ejection fraction was 60%. Wall motion was normal; there were no regional wall motion abnormalities. - Aortic valve: Sclerosis without stenosis. There was no significant regurgitation. - Right ventricle: The cavity size was mildly dilated. Systolic function was mildly  reduced.   Assessment/Plan:   Active Problems:   Acute on chronic diastolic CHF (congestive heart failure)   CKD (chronic kidney disease)   COPD with acute exacerbation   Acute on chronic diastolic congestive heart failure  1. Acute on chronic diastolic CHF 2. COPD exacerbation no infiltrate on CXR 3. Acute of chronic diastolic heart failure 4. Increased LFT's; statin on hold  5. CKD stage 4; cr continue to rise, now 3.01 ( Baseline Cr was 2.2-2.5 sine April 2015) 6. Xarelto anticoagulation; will need to switch if Cr Clearance <15 7. Anemia  Maintaining NSR. Excellent diuresis with 9 lbs weight loss. However, Creatinine now increased to 3.01.  Atorvastatin on hold with increased LFT's.   Congestion improved guafenisen.  Day 3 of prednisone; will decrease to 20 mg for 2 days then 10 mg for 2 days then dc. Will DC iv antibiotics and start renal dose of levaquin. Will dc IV lasix; change to 80 mg daily orally. F/U CMET to re-assess renal and hepatic function, and with anemia f/u CBC in am. Probably OK to transfer to telemetry floor today.    Lennette Biharihomas A. Dewel Lotter, MD, [facility] Center[facility] 08/31/2014, 10:47 AM

## 2014-08-31 NOTE — Care Management Note (Signed)
    Page 1 of 1   08/31/2014     9:30:02 AM CARE MANAGEMENT NOTE 08/31/2014  Patient:  Frederich ChickMEDLEY,Rayen THOMAS   Account Number:  [id number]  Date Initiated:  08/31/2014  Documentation initiated by:  Junius CreamerWELL,DEBBIE  Subjective/Objective Assessment:   adm w heart failure     Action/Plan:   lives w wife, pcp dr Broadus Johnwarren pickard   Anticipated DC Date:  09/03/2014   Anticipated DC Plan:  HOME W HOME HEALTH SERVICES         Choice offered to / List presented to:             Status of service:   Medicare Important Message given?  YES (If response is "NO", the following Medicare IM given date fields will be blank) Date Medicare IM given:  08/31/2014 Medicare IM given by:  Junius CreamerWELL,DEBBIE Date Additional Medicare IM given:   Additional Medicare IM given by:    Discharge Disposition:    Per UR Regulation:  Reviewed for med. necessity/level of care/duration of stay  If discussed at Long Length of Stay Meetings, dates discussed:    Comments:

## 2014-09-01 ENCOUNTER — Telehealth: Payer: Self-pay | Admitting: *Deleted

## 2014-09-01 DIAGNOSIS — I480 Paroxysmal atrial fibrillation: Secondary | ICD-10-CM

## 2014-09-01 DIAGNOSIS — R740 Nonspecific elevation of levels of transaminase and lactic acid dehydrogenase [LDH]: Secondary | ICD-10-CM

## 2014-09-01 DIAGNOSIS — R7989 Other specified abnormal findings of blood chemistry: Secondary | ICD-10-CM

## 2014-09-01 DIAGNOSIS — N179 Acute kidney failure, unspecified: Secondary | ICD-10-CM

## 2014-09-01 DIAGNOSIS — N184 Chronic kidney disease, stage 4 (severe): Secondary | ICD-10-CM

## 2014-09-01 DIAGNOSIS — R7401 Elevation of levels of liver transaminase levels: Secondary | ICD-10-CM

## 2014-09-01 LAB — CBC WITH DIFFERENTIAL/PLATELET
BASOS ABS: 0 10*3/uL (ref 0.0–0.1)
Basophils Relative: 0 % (ref 0–1)
EOS PCT: 0 % (ref 0–5)
Eosinophils Absolute: 0 10*3/uL (ref 0.0–0.7)
HEMATOCRIT: 32.7 % — AB (ref 39.0–52.0)
HEMOGLOBIN: 10.5 g/dL — AB (ref 13.0–17.0)
LYMPHS ABS: 1.7 10*3/uL (ref 0.7–4.0)
Lymphocytes Relative: 18 % (ref 12–46)
MCH: 25.7 pg — ABNORMAL LOW (ref 26.0–34.0)
MCHC: 32.1 g/dL (ref 30.0–36.0)
MCV: 80.1 fL (ref 78.0–100.0)
MONO ABS: 0.9 10*3/uL (ref 0.1–1.0)
Monocytes Relative: 9 % (ref 3–12)
NEUTROS ABS: 6.9 10*3/uL (ref 1.7–7.7)
Neutrophils Relative %: 73 % (ref 43–77)
Platelets: 254 10*3/uL (ref 150–400)
RBC: 4.08 MIL/uL — AB (ref 4.22–5.81)
RDW: 18.2 % — ABNORMAL HIGH (ref 11.5–15.5)
WBC: 9.6 10*3/uL (ref 4.0–10.5)

## 2014-09-01 LAB — COMPREHENSIVE METABOLIC PANEL
ALBUMIN: 2.9 g/dL — AB (ref 3.5–5.2)
ALT: 215 U/L — ABNORMAL HIGH (ref 0–53)
ANION GAP: 16 — AB (ref 5–15)
AST: 57 U/L — AB (ref 0–37)
Alkaline Phosphatase: 74 U/L (ref 39–117)
BUN: 64 mg/dL — AB (ref 6–23)
CALCIUM: 9.6 mg/dL (ref 8.4–10.5)
CO2: 26 mEq/L (ref 19–32)
CREATININE: 2.63 mg/dL — AB (ref 0.50–1.35)
Chloride: 91 mEq/L — ABNORMAL LOW (ref 96–112)
GFR calc Af Amer: 27 mL/min — ABNORMAL LOW (ref >90)
GFR calc non Af Amer: 23 mL/min — ABNORMAL LOW (ref >90)
Glucose, Bld: 136 mg/dL — ABNORMAL HIGH (ref 70–99)
Potassium: 3.8 mEq/L (ref 3.7–5.3)
Sodium: 133 mEq/L — ABNORMAL LOW (ref 137–147)
TOTAL PROTEIN: 7 g/dL (ref 6.0–8.3)
Total Bilirubin: <0.2 mg/dL — ABNORMAL LOW (ref 0.3–1.2)

## 2014-09-01 LAB — GLUCOSE, CAPILLARY
GLUCOSE-CAPILLARY: 128 mg/dL — AB (ref 70–99)
Glucose-Capillary: 150 mg/dL — ABNORMAL HIGH (ref 70–99)

## 2014-09-01 MED ORDER — FUROSEMIDE 40 MG PO TABS: ORAL_TABLET | ORAL | Status: DC

## 2014-09-01 MED ORDER — PREDNISONE 10 MG PO TABS: ORAL_TABLET | ORAL | Status: DC

## 2014-09-01 MED ORDER — LEVOFLOXACIN 750 MG PO TABS: 750.0000 mg | ORAL_TABLET | ORAL | Status: DC

## 2014-09-01 MED ORDER — GUAIFENESIN ER 600 MG PO TB12: 1200.0000 mg | ORAL_TABLET | Freq: Two times a day (BID) | ORAL | Status: DC

## 2014-09-01 NOTE — Telephone Encounter (Signed)
-----   Message from Ulla Galloonya Massey sent at 09/01/2014 11:52 AM EST ----- TOC patient per Ward Givenshris Berge MD scheduled for 09/07/14 @ 8am

## 2014-09-01 NOTE — Discharge Summary (Signed)
Discharge Summary   Patient ID: Mark [name],  MRN: [id number], DOB/AGE: 25-Mar-1946 68 y.o.  Admit date: 08/27/2014 Discharge date: 09/01/2014  Primary Care Provider: [facility] TOM Primary Cardiologist: Wylene Simmer, MD   Discharge Diagnoses Principal Problem:   Acute on chronic diastolic congestive heart failure  **Minus 8.1 liters for this admission with drop in weight from 146 lbs on admission to 137 lbs on discharge.  **Lasix adjusted to 80mg  daily.  Active Problems:   Acute on chronic respiratory failure  **In setting of COPD flare and A/C Diastolic CHF.  **On abx and prednisone taper.   HTN (hypertension)   COPD with acute exacerbation   CKD (chronic kidney disease), stage IV   PAF (paroxysmal atrial fibrillation)   Transaminitis  **Statin currently on hold.  **Needs f/u LFT's at clinic f/u.   AAA (abdominal aortic aneurysm)   HLD (hyperlipidemia)   Allergies Allergies  Allergen Reactions  . Niaspan [Niacin Er] Anaphylaxis    Procedures  2D Echocardiogram 12.11.2015  Study Conclusions  - Left ventricle: The cavity size was normal. Wall thickness was   normal. The estimated ejection fraction was 60%. Wall motion was   normal; there were no regional wall motion abnormalities. - Aortic valve: Sclerosis without stenosis. There was no   significant regurgitation. - Right ventricle: The cavity size was mildly dilated. Systolic   function was mildly reduced. _____________   History of Present Illness  68 y/o male with a h/o chronic diastolic CHF, COPD, and PAF who was in his usual state of health until 203 days prior to admission, when he began to note weight gain, lower extremity edema, and progressive dyspnea with cough and orthopnea.  In the ED, he was markedly hypertensive with pressures in the 200's.  He was placed on IV NTG with improved BP control and given IV lasix with improvement in respiratory status.  CXR showed possible left basilar  infiltrate.  He was admitted for further evaluation and management of acute on chronic respiratory failure.  Hospital Course  Following admission, pt was placed on IV lasix, IV abx, steroid taper, and inhaler therapy.  He was noted to have elevated LFT's with an AST of 305 and ALT of 326.  Statin therapy was held.  Admission creatinine was 2.52 in the setting of stage IV CKD.  He responded well to IV lasix, putting out a net negative of 8.1 liters for this admission.  With that, his weight came down 9 lbs, and is 137 lbs this morning.  In the setting of diuresis, his creatinine did bump to a peak of 3.01 on 12/15, however has returned to 2.63 this morning.  Further, with diuresis, LFT's have come down but have not yet normalized (AST 57, ALT 215).  In that setting we have continued to hold his statin and will f/u LFT's at outpt follow-up.  Patient has remained afebrile throughout admission with normal WBC's.  Suspicion for pneumonia was low.  He was transitioned to PO abx and steroids have been tapered and will continue to be tapered following discharge.    He will be discharged home today in good condition.  I have arranged for early f/u next week.  Discharge Vitals Blood pressure 124/73, pulse 67, temperature 98 F (36.7 C), temperature source Oral, resp. rate 18, height 5' 7.5" (1.715 m), weight 137 lb 1.6 oz (62.188 kg), SpO2 93 %.  Filed Weights   08/30/14 0300 08/31/14 0339 09/01/14 0547  Weight: 139 lb (63.05 kg) 137 lb  5.6 oz (62.3 kg) 137 lb 1.6 oz (62.188 kg)   Labs  CBC  Recent Labs  08/31/14 0328 09/01/14 1029  WBC 7.7 9.6  NEUTROABS  --  6.9  HGB 9.5* 10.5*  HCT 29.9* 32.7*  MCV 79.9 80.1  PLT 245 254   Basic Metabolic Panel  Recent Labs  08/31/14 0328 09/01/14 1029  NA 138 133*  K 4.1 3.8  CL 95* 91*  CO2 29 26  GLUCOSE 142* 136*  BUN 60* 64*  CREATININE 3.01* 2.63*  CALCIUM 9.2 9.6   Liver Function Tests  Recent Labs  09/01/14 1029  AST 57*  ALT  215*  ALKPHOS 74  BILITOT <0.2*  PROT 7.0  ALBUMIN 2.9*   Cardiac Enzymes Lab Results  Component Value Date   TROPONINI <0.30 08/28/2014     Thyroid Function Tests Lab Results  Component Value Date   TSH 1.790 08/27/2014    Disposition  Pt is being discharged home today in good condition.  Follow-up Plans & Appointments  Follow-up Information    [facility].   Specialty:  Family Medicine   Why:  as scheduled.   Contact information:   [address] [place] [address] [phone]       Follow up with Nicolasa Duckinghristopher Preslee Regas, NP On 09/07/2014.   Specialty:  Nurse Practitioner   Why:  8:00 AM - Dr. Yevonne PaxBrackbill's Nurse Practitioner   Contact information:   [address] [place] [address] [phone]       Discharge Medications    Medication List    STOP taking these medications        acetaminophen 325 MG tablet  Commonly known as:  TYLENOL     atorvastatin 40 MG tablet  Commonly known as:  LIPITOR     losartan 50 MG tablet  Commonly known as:  COZAAR      TAKE these medications        albuterol 108 (90 BASE) MCG/ACT inhaler  Commonly known as:  VENTOLIN HFA  Inhale 2 puffs into the lungs every 6 (six) hours as needed for wheezing or shortness of breath.     amiodarone 200 MG tablet  Commonly known as:  PACERONE  Take 1 tablet (200 mg total) by mouth daily.     budesonide-formoterol 160-4.5 MCG/ACT inhaler  Commonly known as:  SYMBICORT  Inhale 2 puffs into the lungs 2 (two) times daily.     esomeprazole 40 MG capsule  Commonly known as:  NEXIUM  Take 40 mg by mouth daily at 12 noon.     furosemide 40 MG tablet  Commonly known as:  LASIX  TAKE 2 TABLETS (40 MG TOTAL) BY MOUTH DAILY.     guaiFENesin 600 MG 12 hr tablet  Commonly known as:  MUCINEX  Take 2 tablets (1,200 mg total) by mouth 2 (two) times daily.     levofloxacin 750 MG tablet  Commonly known as:  LEVAQUIN  Take 1 tablet  (750 mg total) by mouth every other day.     linagliptin 5 MG Tabs tablet  Commonly known as:  TRADJENTA  Take 1 tablet (5 mg total) by mouth daily.     metoprolol succinate 25 MG 24 hr tablet  Commonly known as:  TOPROL-XL  Take 75 mg by mouth daily.     predniSONE 10 MG tablet  Commonly known as:  DELTASONE  2 tabs on 12/17, 1 tab daily x  3 days starting on 12/18, then discontinue     Rivaroxaban 15 MG Tabs tablet  Commonly known as:  XARELTO  Take 1 tablet (15 mg total) by mouth daily.     sodium chloride 0.65 % Soln nasal spray  Commonly known as:  OCEAN  Place 1 spray into both nostrils as needed for congestion.     TAZTIA XT 360 MG 24 hr capsule  Generic drug:  diltiazem  Take 360 mg by mouth daily.     tiotropium 18 MCG inhalation capsule  Commonly known as:  SPIRIVA HANDIHALER  Place 1 capsule (18 mcg total) into inhaler and inhale daily.       Outstanding Labs/Studies  F/U LFT's @ clinic f/u.  Duration of Discharge Encounter   Greater than 30 minutes including physician time.  SignedNicolasa Ducking, Brysten Reister NP 09/01/2014, 12:06 PM

## 2014-09-01 NOTE — Progress Notes (Signed)
09/01/14 1237  Spoke with Mark [name] case management per Mark [name] patient is ready for discharge from her stand point.

## 2014-09-01 NOTE — Progress Notes (Signed)
TELEMETRY: Reviewed telemetry pt in NSR: Filed Vitals:   08/31/14 2106 08/31/14 2157 09/01/14 0547 09/01/14 0801  BP:  146/79 132/66   Pulse:  71 68   Temp:  98 F (36.7 C) 98 F (36.7 C)   TempSrc:  Oral Oral   Resp:  18 18   Height:      Weight:   137 lb 1.6 oz (62.188 kg)   SpO2: 96% 91% 94% 97%    Intake/Output Summary (Last 24 hours) at 09/01/14 0935 Last data filed at 08/31/14 2300  Gross per 24 hour  Intake    480 ml  Output   1275 ml  Net   -795 ml   Filed Weights   08/30/14 0300 08/31/14 0339 09/01/14 0547  Weight: 139 lb (63.05 kg) 137 lb 5.6 oz (62.3 kg) 137 lb 1.6 oz (62.188 kg)    Subjective Feels better. Has chronic SOB but feels it is back to baseline. Mild tickle in his throat. No chest pain.   Marland Kitchen. amiodarone  200 mg Oral Daily  . budesonide-formoterol  2 puff Inhalation BID  . diltiazem  360 mg Oral Daily  . furosemide  80 mg Oral Daily  . guaiFENesin  1,200 mg Oral BID  . insulin aspart  0-15 Units Subcutaneous TID WC  . insulin aspart  0-5 Units Subcutaneous QHS  . levofloxacin  750 mg Oral Q48H  . linagliptin  5 mg Oral Daily  . metoprolol succinate  75 mg Oral Daily  . pantoprazole  40 mg Oral Daily  . predniSONE  20 mg Oral Q breakfast   Followed by  . [START ON 09/03/2014] predniSONE  10 mg Oral Q breakfast  . Rivaroxaban  15 mg Oral Daily  . sodium chloride  3 mL Intravenous Q12H  . tiotropium  18 mcg Inhalation Daily   . nitroGLYCERIN 0 mcg/min (08/27/14 2206)    LABS: Basic Metabolic Panel:  Recent Labs  16/06/9611/14/15 0248 08/31/14 0328  NA 139 138  K 4.2 4.1  CL 98 95*  CO2 29 29  GLUCOSE 133* 142*  BUN 55* 60*  CREATININE 2.83* 3.01*  CALCIUM 9.1 9.2   Liver Function Tests: No results for input(s): AST, ALT, ALKPHOS, BILITOT, PROT, ALBUMIN in the last 72 hours. No results for input(s): LIPASE, AMYLASE in the last 72 hours. CBC:  Recent Labs  08/31/14 0328  WBC 7.7  HGB 9.5*  HCT 29.9*  MCV 79.9  PLT 245    Cardiac Enzymes: No results for input(s): CKTOTAL, CKMB, CKMBINDEX, TROPONINI in the last 72 hours. BNP:  Recent Labs  08/30/14 0248 08/31/14 0328  PROBNP 5324.0* 4388.0*     Radiology/Studies:  CHEST 2 VIEW  COMPARISON: 08/27/2014  FINDINGS: Normal heart size. Stable mild aortic tortuosity. Pulmonary hyperinflation with flattening of the diaphragm correlating with patient's history of COPD. Interstitial coarsening at the bases noted on the previous study has significantly improved, now likely at baseline. Possible trace pleural effusions; No pneumonia or edema. No pneumothorax.  IMPRESSION: COPD without acute superimposed finding.   Electronically Signed By: Tiburcio PeaJonathan Watts M.D. On: 08/30/2014 05:39  Echo:Study Conclusions  - Left ventricle: The cavity size was normal. Wall thickness was normal. The estimated ejection fraction was 60%. Wall motion was normal; there were no regional wall motion abnormalities. - Aortic valve: Sclerosis without stenosis. There was no significant regurgitation. - Right ventricle: The cavity size was mildly dilated. Systolic function was mildly reduced.  PHYSICAL EXAM General: Well developed, well nourished,  in no acute distress. Head: Normocephalic, atraumatic, sclera non-icteric, oropharynx is clear Neck: Negative for carotid bruits. JVD not elevated. No adenopathy Lungs: Bilateral coarse rhonchi. Heart: RRR S1 S2 with 1/6 SEM.  Abdomen: Soft, non-tender, non-distended with normoactive bowel sounds. No hepatomegaly. No rebound/guarding. No obvious abdominal masses. Msk:  Strength and tone appears normal for age. Extremities: No clubbing, cyanosis or edema.  Distal pedal pulses are 2+ and equal bilaterally. Neuro: Alert and oriented X 3. Moves all extremities spontaneously. Psych:  Responds to questions appropriately with a normal affect.  ASSESSMENT AND PLAN: 1. Acute on chronic diastolic CHF. Good diuresis.  Weight down 9 lbs since admit. I/O negative 8.3 liters. Now on oral lasix 80 mg daily. Was on 40 mg PTA. Continue metoprolol and Cardizem. Follow up BMET today. 2. Acute COPD exacerbation with Acute on chronic respiratory failure. Tapering steroids. Oxygen levels OK on RA. Complete course of antibiotics.  3. Elevated LFTs. ? Related to acute CHF. Statin held. Will repeat today. If LFTs are improved I would resume statin and follow up LFTs again as an outpatient. 4. Acute on chronic kidney disease stage 4. Mild increase in creatinine over baseline probably due to aggressive diuresis. Will repeat today. 5. Paroxysmal AFib. Maintaining NSR on amiodarone.  6. Anemia of renal disease.  Will check CMET and CBC this am. If stable I think he can be DC later this morning.   Present on Admission:  . Acute on chronic diastolic CHF (congestive heart failure)  Signed, Marnae Madani [place], MDFACC 09/01/2014 9:35 AM

## 2014-09-01 NOTE — Progress Notes (Signed)
09/01/14 1300  Reviewed discharge instructions with patient. Patient verbalized understanding of discharge instructions.  Copy of discharge instructions given to patient.

## 2014-09-02 NOTE — Telephone Encounter (Signed)
TCM call to patient.Stated he is feeling good,wanted to clarify directions for lasix.Advised he should be taking lasix 40 mg 2 tablets daily.Advised to keep post hospital appointment with Ward Givenshris Berge NP 09/07/14 at 8:00 am.

## 2014-09-07 ENCOUNTER — Encounter: Payer: Self-pay | Admitting: Nurse Practitioner

## 2014-09-07 ENCOUNTER — Ambulatory Visit (INDEPENDENT_AMBULATORY_CARE_PROVIDER_SITE_OTHER): Payer: Medicare Other | Admitting: Nurse Practitioner

## 2014-09-07 VITALS — BP 116/58 | HR 55 | Ht 67.5 in | Wt 143.2 lb

## 2014-09-07 DIAGNOSIS — I5033 Acute on chronic diastolic (congestive) heart failure: Secondary | ICD-10-CM

## 2014-09-07 DIAGNOSIS — I10 Essential (primary) hypertension: Secondary | ICD-10-CM

## 2014-09-07 DIAGNOSIS — N184 Chronic kidney disease, stage 4 (severe): Secondary | ICD-10-CM

## 2014-09-07 DIAGNOSIS — E785 Hyperlipidemia, unspecified: Secondary | ICD-10-CM

## 2014-09-07 DIAGNOSIS — I480 Paroxysmal atrial fibrillation: Secondary | ICD-10-CM

## 2014-09-07 DIAGNOSIS — J410 Simple chronic bronchitis: Secondary | ICD-10-CM

## 2014-09-07 DIAGNOSIS — R740 Nonspecific elevation of levels of transaminase and lactic acid dehydrogenase [LDH]: Secondary | ICD-10-CM

## 2014-09-07 DIAGNOSIS — R7401 Elevation of levels of liver transaminase levels: Secondary | ICD-10-CM

## 2014-09-07 LAB — COMPREHENSIVE METABOLIC PANEL
ALBUMIN: 3.5 g/dL (ref 3.5–5.2)
ALT: 62 U/L — AB (ref 0–53)
AST: 29 U/L (ref 0–37)
Alkaline Phosphatase: 66 U/L (ref 39–117)
BUN: 70 mg/dL — AB (ref 6–23)
CO2: 21 meq/L (ref 19–32)
CREATININE: 3.5 mg/dL — AB (ref 0.4–1.5)
Calcium: 9.1 mg/dL (ref 8.4–10.5)
Chloride: 97 mEq/L (ref 96–112)
GFR: 18.43 mL/min — ABNORMAL LOW (ref >60.00)
Glucose, Bld: 99 mg/dL (ref 70–99)
POTASSIUM: 5.1 meq/L (ref 3.5–5.1)
Sodium: 132 mEq/L — ABNORMAL LOW (ref 135–145)
Total Bilirubin: 0.6 mg/dL (ref 0.2–1.2)
Total Protein: 6.8 g/dL (ref 6.0–8.3)

## 2014-09-07 NOTE — Progress Notes (Signed)
Patient Name: Mark [name] Date of Encounter: 09/07/2014  Primary Care Provider:  Leo Grosser, MD Primary Cardiologist:  Wylene Simmer, MD    Patient Profile  68 year old male who presents today following recent admission for acute on chronic diastolic heart failure.  Problem List   Past Medical History  Diagnosis Date  . GERD (gastroesophageal reflux disease)   . Hyperlipidemia   . H/O hiatal hernia   . Hypertension     Sees Dr. Lynnea Ferrier  . AAA (abdominal aortic aneurysm)   . COPD (chronic obstructive pulmonary disease)   . Diastolic heart failure     a.  Echo (08/13/2013): EF 55-60%, normal wall motion, mild MR, mild LAE  . Pneumonia   . Arthritis     "little in my hands" (09/08/2013)  . Chronic kidney disease (CKD), stage III (moderate)   . Atrial fibrillation     a. amiodarone Rx started 08/2013;  b. s/p TEE-DCCV 09/2013 => recurrent AFib => repeat DCCV => NSR;   c. Xarelto 15 QD due to CKD  . Hx of cardiovascular stress test     a. Myoview 09/2012:  no scar or ischemia  . 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 Knee arthroscopy Right   . Abdominal aortic aneurysm repair  10/15/2012    Procedure: ANEURYSM ABDOMINAL AORTIC REPAIR;  Surgeon: Pryor Ochoa, MD;  Location: [facility] OR;  Service: Vascular;  Laterality: N/A;  Resection and Grafting of Abdominal Aortic Aneurysm - Aortobi-Iliac   . Tee without cardioversion N/A 09/22/2013    Procedure: TRANSESOPHAGEAL ECHOCARDIOGRAM (TEE);  Surgeon: Lars Masson, MD;  Location: [facility] ENDOSCOPY;  Service: Cardiovascular;  Laterality: N/A;  . Cardioversion N/A 09/22/2013    Procedure: CARDIOVERSION;  Surgeon: Lars Masson, MD;  Location: [facility] ENDOSCOPY;  Service: Cardiovascular;  Laterality: N/A;  . Abdominal aortic aneurysm repair  10-15-2012  . Cardioversion  Jan. 12, 2015    Dr. Doylene Canning. Ladona Ridgel  . Cardioversion N/A 09/28/2013    Procedure:  CARDIOVERSION;  Surgeon: Marinus Maw, MD;  Location: [facility] CATH LAB;  Service: Cardiovascular;  Laterality: N/A;    Allergies  Allergies  Allergen Reactions  . Niaspan [Niacin Er] Anaphylaxis    HPI  68 year old male with the above problem list. He was recently admitted to [facility] for acute on chronic respiratory failure which was multifactorial in nature. He was treated with antibiotics due to concern for pneumonia, steroids secondary to COPD, and diuresed with good weight loss and clinical improvement. Since his discharge, he says his weight is up about 5 pounds on his scale at home. He has noted lower extremity swelling and also mild orthopnea. He denies PND, early satiety, or significant dyspnea on exertion. He has been watching his salt intake closely. He has been compliant with his medications.  Home Medications  Prior to Admission medications   Medication Sig Start Date End Date Taking? Authorizing Provider  albuterol (VENTOLIN HFA) 108 (90 BASE) MCG/ACT inhaler Inhale 2 puffs into the lungs every 6 (six) hours as needed for wheezing or shortness of breath. 06/15/14  Yes Donita Brooks, MD  amiodarone (PACERONE) 200 MG tablet Take 1 tablet (200 mg total) by mouth daily. 11/13/13  Yes Donita Brooks, MD  budesonide-formoterol [facility]) 160-4.5 MCG/ACT inhaler Inhale 2 puffs into the lungs 2 (two) times daily. 02/24/14  Yes Donita Brooks, MD  diltiazem (TAZTIA XT) 360  MG 24 hr capsule Take 360 mg by mouth daily.   Yes Historical Provider, MD  esomeprazole (NEXIUM) 40 MG capsule Take 40 mg by mouth daily at 12 noon.   Yes Historical Provider, MD  furosemide (LASIX) 40 MG tablet TAKE 2 TABLETS (40 MG TOTAL) BY MOUTH DAILY. Patient taking differently: Take 40 mg by mouth. TAKE 2 TABLETS 80 MG IN  BY MOUTH  IN AM AND TAKE ONE TABLET ( 40 MG) IN PM 09/01/14  Yes Ok Anishristopher R Shameeka Silliman, NP  guaiFENesin (MUCINEX) 600 MG 12 hr tablet Take 2 tablets (1,200 mg total) by mouth 2 (two) times  daily. 09/01/14  Yes Ok Anishristopher R Alan Drummer, NP  linagliptin (TRADJENTA) 5 MG TABS tablet Take 1 tablet (5 mg total) by mouth daily. 02/24/14  Yes Donita BrooksWarren T Pickard, MD  losartan (COZAAR) 50 MG tablet Take 50 mg by mouth daily. 08/16/14  Yes Historical Provider, MD  metoprolol succinate (TOPROL-XL) 25 MG 24 hr tablet Take 75 mg by mouth daily.   Yes Historical Provider, MD  Rivaroxaban (XARELTO) 15 MG TABS tablet Take 1 tablet (15 mg total) by mouth daily. 04/23/14  Yes Donita BrooksWarren T Pickard, MD  sodium chloride (OCEAN) 0.65 % SOLN nasal spray Place 1 spray into both nostrils as needed for congestion.   Yes Historical Provider, MD  tiotropium (SPIRIVA HANDIHALER) 18 MCG inhalation capsule Place 1 capsule (18 mcg total) into inhaler and inhale daily. 02/24/14  Yes Donita BrooksWarren T Pickard, MD    Review of Systems  As above, he has been experiencing lower extremity swelling associated with orthopnea and weight gain.  All other systems reviewed and are otherwise negative except as noted above.  Physical Exam  Blood pressure 116/58, pulse 55, height 5' 7.5" (1.715 m), weight 143 lb 3.2 oz (64.955 kg).  General: Pleasant, NAD Psych: Normal affect. Neuro: Alert and oriented X 3. Moves all extremities spontaneously. HEENT: Normal  Neck: Supple without bruits. JVP approximately 12 cm. Lungs:  Resp regular and unlabored, scattered rhonchi. Heart: RRR no s3, s4, or murmurs. Abdomen: Soft, non-tender, non-distended, BS + x 4.  Extremities: No clubbing, cyanosis. 1+ bilateral ankle edema. DP/PT/Radials 2+ and equal bilaterally.  Accessory Clinical Findings  ECG - sinus bradycardia, 55, no acute ST or T changes.  Assessment & Plan  1.  Acute on chronic diastolic congestive heart failure: Patient was recently hospitalized and treated for CHF with a net negative of 8.1 L and 9 pound weight loss. Since his discharge, he says he is up 5 pounds with lower extremity swelling and orthopnea. He does have evidence of volume  overload on exam. He is currently on Lasix 40 mg twice a day. I will increase his a.m. Lasix dose to 80 mg and keep his p.m. Lasix dose at 40 mg. As he had markedly elevated LFTs while hospitalized, will repeat a complete metabolic panel today. His heart rate and blood pressure well controlled and he remains on a blocker and calcium channel blocker. He will need follow-up in one week.  2. Paroxysmal atrial fibrillation: He is in sinus rhythm today. He is anticoagulated with xarelto 15mg  daily - this is renally dosed 2/2 ckd IV.  Continue beta blocker and calcium channel blocker.  3.  CKD IV:  F/u renal fxn today.  We stopped his ARB during hospitalization.  4.  Transaminitis:  LFT's were elevated during hospitalization.  Statin was to be held however he says that he has been taking crestor.  F/U LFT's today. Heper IM.  5.  DM:  On tradjenta  per internal medicine.   6. COPD with recent exacerbation/simple chronic bronchitis: He is status post prednisone taper and is not wheezing today. He remains on inhalers at home.   7. Hyperlipidemia: I recommend that he hold his statin until he hears back from us regarding his LFTs.  8. Disposition: Follow-up in clinic in 1 week.  Nicolasa Duckinghristopher Izetta Sakamoto, NP 09/07/2014, 8:53 AM

## 2014-09-07 NOTE — Patient Instructions (Signed)
Your physician has recommended you make the following change in your medication:     TAKE  LASIX  2 TABLETS 80 MG IN  BY MOUTH  IN AM AND TAKE ONE TABLET ( 40 MG) IN PM   Your physician recommends that you return for lab work in:  CMET TODAY   Your physician recommends that you schedule a follow-up appointment in:  WITH FLEX DANA DUNN NEXT WEEK

## 2014-09-08 ENCOUNTER — Other Ambulatory Visit: Payer: Self-pay | Admitting: *Deleted

## 2014-09-08 DIAGNOSIS — N289 Disorder of kidney and ureter, unspecified: Secondary | ICD-10-CM

## 2014-09-08 MED ORDER — FUROSEMIDE 40 MG PO TABS: 40.0000 mg | ORAL_TABLET | Freq: Every day | ORAL | Status: DC

## 2014-09-08 MED ORDER — METOLAZONE 2.5 MG PO TABS: ORAL_TABLET | ORAL | Status: DC

## 2014-09-13 ENCOUNTER — Encounter: Payer: Self-pay | Admitting: Physician Assistant

## 2014-09-13 ENCOUNTER — Ambulatory Visit (INDEPENDENT_AMBULATORY_CARE_PROVIDER_SITE_OTHER): Payer: Medicare Other | Admitting: Physician Assistant

## 2014-09-13 ENCOUNTER — Other Ambulatory Visit: Payer: Self-pay | Admitting: *Deleted

## 2014-09-13 VITALS — BP 132/72 | HR 58 | Ht 67.0 in | Wt 145.0 lb

## 2014-09-13 DIAGNOSIS — Z9889 Other specified postprocedural states: Secondary | ICD-10-CM

## 2014-09-13 DIAGNOSIS — I5033 Acute on chronic diastolic (congestive) heart failure: Secondary | ICD-10-CM

## 2014-09-13 DIAGNOSIS — Z8679 Personal history of other diseases of the circulatory system: Secondary | ICD-10-CM

## 2014-09-13 DIAGNOSIS — N179 Acute kidney failure, unspecified: Secondary | ICD-10-CM

## 2014-09-13 DIAGNOSIS — R740 Nonspecific elevation of levels of transaminase and lactic acid dehydrogenase [LDH]: Secondary | ICD-10-CM

## 2014-09-13 DIAGNOSIS — E785 Hyperlipidemia, unspecified: Secondary | ICD-10-CM

## 2014-09-13 DIAGNOSIS — I480 Paroxysmal atrial fibrillation: Secondary | ICD-10-CM

## 2014-09-13 DIAGNOSIS — N184 Chronic kidney disease, stage 4 (severe): Secondary | ICD-10-CM

## 2014-09-13 DIAGNOSIS — R7401 Elevation of levels of liver transaminase levels: Secondary | ICD-10-CM

## 2014-09-13 DIAGNOSIS — J449 Chronic obstructive pulmonary disease, unspecified: Secondary | ICD-10-CM

## 2014-09-13 DIAGNOSIS — I5031 Acute diastolic (congestive) heart failure: Secondary | ICD-10-CM

## 2014-09-13 MED ORDER — ATORVASTATIN CALCIUM 10 MG PO TABS: 10.0000 mg | ORAL_TABLET | Freq: Every day | ORAL | Status: DC

## 2014-09-13 NOTE — Patient Instructions (Signed)
Your physician has recommended you make the following change in your medication:    START TAKING ATORVASTATIN 10 MG ONCE A DAY    Your physician recommends that you return for lab work in:  BMET TODAY IN ONE WEEK RETURN FOR LAB CMET    FOLLOW UP WITH DANN OR CB PREFERABLY IN ONE WEEK OR DR BRACKBILL   ASAP REFERRAL TO NEPHROLOGY

## 2014-09-13 NOTE — Progress Notes (Signed)
[address] [place], [place]  [address] Phone: [phone] Fax:  [phone]  Date:  09/13/2014   Patient ID:  Mark [name], DOB 1945/12/25, MRN [id number]   PCP:  Leo GrosserPICKARD,WARREN TOM, MD  Cardiologist:  Patty SermonsBrackbill  History of Present Illness: Mark ChickHorace Thomas [name] is a 68 y.o. male with history of HTN, HLD, AAA s/p repair 09/2012, COPD, chronic diastolic CHF, CKD stage IV, PAF, nuc 09/2012 (no scar or ischemia) and DM who presents for followup. He was recently [facility] for acute on chronic respiratory failure which was multifactorial in nature. He was treated with antibiotics due to concern for pneumonia, steroids secondary to COPD, and diuresed with good weight loss and clinical improvement. ARB was stopped during that hospitalization due to renal dysfunction. TSH WNL. 2D Echo 12/11: EF 60%, no RWMA, aortic sclerosis without stenosis, mildly dilated RV, RV systolic function mildly reduced. During his admission he had markedly elevated LFTs in 300's thus statin was held. DC wt 137lb. He followed up in the office 12/22 at which time weight was up 5lbs. Initially the plan was to increase Lasix to 80mg  QAM/40mg  QPM (from 40mg  BID). However, f/u labs that day indicated Na 132, BUN 70/Cr 3.5, K 5.1. AST 29, ALT 62. Mark Givenshris Berge, NP recommended to decrease Lasix to 40mg  daily and add metolazone 2.5mg  every MWF. Mark [name] also recommended to f/u nephrologist if he had one, or to refer if he didn't (he does not).  He has been compliant with Lasix. He just started the metolazone first dose today. He notices continued bilateral LEE and mild abdominal distention. He has chronic orthopnea. No significant DOE or CP. Weight was 140.4 at home today in his pajama bottoms which he thinks is near his normal weight. However, weight confirmed by me in clinic without shoes and jacket was 145, up an additional 2lbs since last OV. His granddaughter helps with his care.  Recent  Labs: 07/20/2014: HDL Cholesterol by NMR 54; LDL (calc) 49 08/27/2014: TSH 1.790 08/31/2014: Pro B Natriuretic peptide (BNP) 4388.0* 09/01/2014: Hemoglobin 10.5* 09/07/2014: ALT 62*; Creatinine 3.5*; Potassium 5.1  Wt Readings from Last 3 Encounters:  09/13/14 145 lb (65.772 kg)  09/07/14 143 lb 3.2 oz (64.955 kg)  09/01/14 137 lb 1.6 oz (62.188 kg)     Past Medical History  Diagnosis Date  . GERD (gastroesophageal reflux disease)   . Hyperlipidemia   . H/O hiatal hernia   . Hypertension     Sees Dr. Lynnea FerrierWarren Pickard  . AAA (abdominal aortic aneurysm)     a. 09/2013: Resection and grafting of abdominal aortic aneurysm with insertion of an aorto by common iliac graft using 18 x 9 mm Hemashield Dacron graft.  Marland Kitchen. COPD (chronic obstructive pulmonary disease)   . Chronic diastolic CHF (congestive heart failure)     a.  Echo (08/13/2013): EF 55-60%, normal wall motion, mild MR, mild LAE  . Pneumonia   . Arthritis     "little in my hands" (09/08/2013)  . Chronic kidney disease (CKD), stage III (moderate)   . PAF (paroxysmal atrial fibrillation)     a. amiodarone Rx started 08/2013;  b. s/p TEE-DCCV 09/2013 => recurrent AFib => repeat DCCV => NSR;   c. Xarelto 15 QD due to CKD  . Hx of cardiovascular stress test     a. Myoview 09/2012:  no scar or ischemia  . Diabetes mellitus without complication     TYPE 2  Current Outpatient Prescriptions  Medication Sig Dispense Refill  . amiodarone (PACERONE) 200 MG tablet Take 1 tablet (200 mg total) by mouth daily. 30 tablet 11  . budesonide-formoterol (SYMBICORT) 160-4.5 MCG/ACT inhaler Inhale 2 puffs into the lungs 2 (two) times daily. 1 Inhaler 11  . diltiazem (TAZTIA XT) 360 MG 24 hr capsule Take 360 mg by mouth daily.    Marland Kitchen. esomeprazole (NEXIUM) 40 MG capsule Take 40 mg by mouth daily at 12 noon.    . furosemide (LASIX) 40 MG tablet Take 1 tablet (40 mg total) by mouth daily. 60 tablet 5  . linagliptin (TRADJENTA) 5 MG TABS tablet Take 1  tablet (5 mg total) by mouth daily. 30 tablet 11  . losartan (COZAAR) 50 MG tablet Take 50 mg by mouth daily.  3  . metolazone (ZAROXOLYN) 2.5 MG tablet One tablet Monday, Wednesday and friday 12 tablet 12  . metoprolol succinate (TOPROL-XL) 25 MG 24 hr tablet Take 75 mg by mouth daily.    . Rivaroxaban (XARELTO) 15 MG TABS tablet Take 1 tablet (15 mg total) by mouth daily. 30 tablet 11  . sodium chloride (OCEAN) 0.65 % SOLN nasal spray Place 1 spray into both nostrils as needed for congestion.    Marland Kitchen. tiotropium (SPIRIVA HANDIHALER) 18 MCG inhalation capsule Place 1 capsule (18 mcg total) into inhaler and inhale daily. 30 capsule 11  . albuterol (VENTOLIN HFA) 108 (90 BASE) MCG/ACT inhaler Inhale 2 puffs into the lungs every 6 (six) hours as needed for wheezing or shortness of breath. (Patient not taking: Reported on 09/13/2014) 1 Inhaler 6  . atorvastatin (LIPITOR) 10 MG tablet Take 1 tablet (10 mg total) by mouth daily. 90 tablet 3  . guaiFENesin (MUCINEX) 600 MG 12 hr tablet Take 2 tablets (1,200 mg total) by mouth 2 (two) times daily. (Patient not taking: Reported on 09/13/2014) 40 tablet 1   No current facility-administered medications for this visit.    Allergies:   Niaspan   Social History:  The patient  reports that he quit smoking about 13 months ago. His smoking use included Cigarettes. He has a 56 pack-year smoking history. He has quit using smokeless tobacco. His smokeless tobacco use included Chew. He reports that he does not drink alcohol or use illicit drugs.   Family History:  The patient's family history includes Diabetes in his mother; Heart attack in his father; Heart disease in his father and son; Tuberculosis in his mother.   ROS:  Please see the history of present illness. No bleeding. All other systems reviewed and negative.   PHYSICAL EXAM: VS:  BP 132/72 mmHg  Pulse 58  Ht 5\' 7"  (1.702 m)  Wt 145 lb (65.772 kg)  BMI 22.71 kg/m2 Well nourished, well developed WM in  no acute distress HEENT: normal Neck: no JVD Cardiac:  normal S1, S2; RRR; no murmur Lungs:  clear to auscultation bilaterally, no wheezing, rhonchi or rales Abd: soft, nontender, no hepatomegaly Ext: 1+ bilateral pitting edema Skin: warm and dry Neuro:  moves all extremities spontaneously, no focal abnormalities noted  EKG:  Sinus bradycardia 58bpm no acute ST-T changes    ASSESSMENT AND PLAN:  1. Acute on chronic diastolic CHF - appears volume overloaded on exam and weight is up 2 lbs further. However, he just started metolazone today. I discussed with Dr. Patty SermonsBrackbill. At this time he agrees with the plan to 1) observe for response on newly-started metolazone and 2) try to get him in to see nephrology.  We will also update his BMET today to see if there has been any major change from last week. The patient and his granddaughter do report he is sticking within recommendations for salt and fluid restriction. 2. AKI on CKD stage IV - repeat labs 12/22 showed worsened creatinine to 3.5. Changes made as above. F/u BMET today. Have placed an ASAP referral to nephrology. ARB stopped during hospitalization but it was on his med list at last OV and is on it again today. We have asked the patient to please check at home to make sure he is no longer taking this - nurse called his granddaughter at home to double check this. She thinks he is off it. We have discontined it from his med list.  3. Paroxysmal atrial fibrillation - on NSR. Anticoagulated with Xarelto 15mg  daily renally dosed 2/2 CKD IV. F/u renal function today. Continue BB and CCB. 4. Recent transaminitis - improved by 12/22 labs. 5. COPD - improved from hospital d/c. No wheezing. 6. Hyperlipidemia - will restart atorvastatin at lower dose, 10mg  daily, given recent transaminitis. Will f/u CMET 1 week and likely plan on repeat CMET 6 weeks out depending on result. 7. AAA - followed by [facility].   Dispo: F/u 1 week in clinic with APP or Dr. Patty Sermons  with repeat CMET.  Signed, Ronie Spies, PA-C  09/13/2014 3:03 PM

## 2014-09-14 ENCOUNTER — Telehealth: Payer: Self-pay | Admitting: *Deleted

## 2014-09-14 LAB — BASIC METABOLIC PANEL
BUN/Creatinine Ratio: 15 (ref 10–22)
BUN: 45 mg/dL — AB (ref 8–27)
CO2: 28 mmol/L (ref 18–29)
CREATININE: 2.95 mg/dL — AB (ref 0.76–1.27)
Calcium: 8.8 mg/dL (ref 8.6–10.2)
Chloride: 99 mmol/L (ref 96–108)
GFR calc non Af Amer: 21 mL/min/{1.73_m2} — ABNORMAL LOW (ref >59)
GFR, EST AFRICAN AMERICAN: 24 mL/min/{1.73_m2} — AB (ref >59)
GLUCOSE: 87 mg/dL (ref 65–99)
Potassium: 4.6 mmol/L (ref 3.5–5.2)
Sodium: 135 mmol/L (ref 134–144)

## 2014-09-14 NOTE — Telephone Encounter (Signed)
Pt notified about lab results and to f/u w/Nephrology. BMET 1/4, Marvis Represshris Berg, NP 1/8. Pt verbalized understanding to instructions.

## 2014-09-20 ENCOUNTER — Other Ambulatory Visit (INDEPENDENT_AMBULATORY_CARE_PROVIDER_SITE_OTHER): Payer: Medicare Other | Admitting: *Deleted

## 2014-09-20 DIAGNOSIS — N184 Chronic kidney disease, stage 4 (severe): Secondary | ICD-10-CM

## 2014-09-20 DIAGNOSIS — N179 Acute kidney failure, unspecified: Secondary | ICD-10-CM

## 2014-09-20 LAB — COMPREHENSIVE METABOLIC PANEL
ALK PHOS: 63 U/L (ref 39–117)
ALT: 23 U/L (ref 0–53)
AST: 23 U/L (ref 0–37)
Albumin: 3.3 g/dL — ABNORMAL LOW (ref 3.5–5.2)
BILIRUBIN TOTAL: 0.5 mg/dL (ref 0.2–1.2)
BUN: 44 mg/dL — AB (ref 6–23)
CALCIUM: 8.6 mg/dL (ref 8.4–10.5)
CO2: 29 mEq/L (ref 19–32)
Chloride: 95 mEq/L — ABNORMAL LOW (ref 96–112)
Creatinine, Ser: 3.2 mg/dL — ABNORMAL HIGH (ref 0.4–1.5)
GFR: 20.81 mL/min — ABNORMAL LOW (ref >60.00)
Glucose, Bld: 112 mg/dL — ABNORMAL HIGH (ref 70–99)
Potassium: 3.7 mEq/L (ref 3.5–5.1)
Sodium: 133 mEq/L — ABNORMAL LOW (ref 135–145)
Total Protein: 6.8 g/dL (ref 6.0–8.3)

## 2014-09-24 ENCOUNTER — Encounter: Payer: Self-pay | Admitting: Nurse Practitioner

## 2014-09-24 ENCOUNTER — Ambulatory Visit (INDEPENDENT_AMBULATORY_CARE_PROVIDER_SITE_OTHER): Payer: Medicare Other | Admitting: Nurse Practitioner

## 2014-09-24 VITALS — BP 100/58 | HR 71 | Ht 67.0 in | Wt 140.2 lb

## 2014-09-24 DIAGNOSIS — E785 Hyperlipidemia, unspecified: Secondary | ICD-10-CM

## 2014-09-24 DIAGNOSIS — I5032 Chronic diastolic (congestive) heart failure: Secondary | ICD-10-CM

## 2014-09-24 DIAGNOSIS — N184 Chronic kidney disease, stage 4 (severe): Secondary | ICD-10-CM

## 2014-09-24 DIAGNOSIS — I10 Essential (primary) hypertension: Secondary | ICD-10-CM

## 2014-09-24 DIAGNOSIS — I480 Paroxysmal atrial fibrillation: Secondary | ICD-10-CM

## 2014-09-24 LAB — BASIC METABOLIC PANEL
BUN: 45 mg/dL — ABNORMAL HIGH (ref 6–23)
CHLORIDE: 95 meq/L — AB (ref 96–112)
CO2: 28 mEq/L (ref 19–32)
Calcium: 8.9 mg/dL (ref 8.4–10.5)
Creatinine, Ser: 4.1 mg/dL — ABNORMAL HIGH (ref 0.4–1.5)
GFR: 15.28 mL/min — ABNORMAL LOW (ref >60.00)
Glucose, Bld: 107 mg/dL — ABNORMAL HIGH (ref 70–99)
Potassium: 3.9 mEq/L (ref 3.5–5.1)
Sodium: 134 mEq/L — ABNORMAL LOW (ref 135–145)

## 2014-09-24 NOTE — Progress Notes (Signed)
Patient Name: Mark [name] Date of Encounter: 09/24/2014  Primary Care Provider:  Leo GrosserPICKARD,WARREN TOM, MD Primary Cardiologist:  Wylene Simmer. Brackbill, MD   Patient Profile  69 year old male who presents today for ongoing f/u re: diastolic heart failure.  Problem List   Past Medical History  Diagnosis Date  . GERD (gastroesophageal reflux disease)   . Hyperlipidemia   . H/O hiatal hernia   . Hypertension     Sees Dr. Lynnea FerrierWarren Pickard  . AAA (abdominal aortic aneurysm)     a. 09/2013: Resection and grafting of abdominal aortic aneurysm with insertion of an aorto by common iliac graft using 18 x 9 mm Hemashield Dacron graft.  Marland Kitchen. COPD (chronic obstructive pulmonary disease)   . Chronic diastolic CHF (congestive heart failure)     a.  Echo (08/13/2013): EF 55-60%, normal wall motion, mild MR, mild LAE  . Pneumonia   . Arthritis     "little in my hands" (09/08/2013)  . Chronic kidney disease (CKD), stage III (moderate)   . PAF (paroxysmal atrial fibrillation)     a. amiodarone Rx started 08/2013;  b. s/p TEE-DCCV 09/2013 => recurrent AFib => repeat DCCV => NSR;   c. Xarelto 15 QD due to CKD  . Hx of cardiovascular stress test     a. Myoview 09/2012:  no scar or ischemia  . 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 Knee arthroscopy Right   . Abdominal aortic aneurysm repair  10/15/2012    Procedure: ANEURYSM ABDOMINAL AORTIC REPAIR;  Surgeon: Pryor OchoaJames D Lawson, MD;  [facility];  Service: Vascular;  Laterality: N/A;  Resection and Grafting of Abdominal Aortic Aneurysm - Aortobi-Iliac   . Tee without cardioversion N/A 09/22/2013    Procedure: TRANSESOPHAGEAL ECHOCARDIOGRAM (TEE);  Surgeon: Lars MassonKatarina H Nelson, MD;  [facility];  Service: Cardiovascular;  Laterality: N/A;  . Cardioversion N/A 09/22/2013    Procedure: CARDIOVERSION;  Surgeon: Lars MassonKatarina H Nelson, MD;  [facility];  Service: Cardiovascular;   Laterality: N/A;  . Abdominal aortic aneurysm repair  10-15-2012  . Cardioversion  Jan. 12, 2015    Dr. Doylene CanningGregg W. Ladona Ridgelaylor  . Cardioversion N/A 09/28/2013    Procedure: CARDIOVERSION;  Surgeon: Marinus MawGregg W Taylor, MD;  [facility];  Service: Cardiovascular;  Laterality: N/A;    Allergies  Allergies  Allergen Reactions  . Niaspan [Niacin Er] Anaphylaxis    HPI  69 y/o male with the above complex problem list.  [facility] with diast CHF, COPD flare, and CKD IV in early December.  He was diuresed and treated for PNA/COPD with clinical improvement.  When I saw him in f/u he was up 5 lbs with lower ext edema and mild orthopnea.  We had initially planned to increase his lasix from 40 bid to 80 am, 40 pm, however labs that day showed an elevation in BUN/Creat to 70/3.5.  After discussing with Dr. Patty SermonsBrackbill, we opted to reduce his lasix to 40 mg daily and add metolazone 2.5 on mwf.  He was seen back 12/28 @ which time he had only just started taking metolazone.  His wt was up further.  As he had just started taking metolazone, plan was to continue prev outlined plan.  F/U BMET that day showed BUN 45, Creat 2.95.  On 3x/wk metolazone and lasix 40 daily, his wt has come back down and he has been between 133  and 135 lbs on his home scale.  BMET on 1/4 showed BUN/Creat of 44/3.2.  Na 133, K 3.7.    He has been symptomatically stable.  He does experience DOE after walking about 100 ft but says that overall this is stable.  He has not had any chest pain and denies pnd, orthopnea, n, v, dizziness, syncope, or early satiety.  He has mild bilateral malleolar edema, which for him, is a major improvement.  He has f/u with nephrology on Monday.  Home Medications  Prior to Admission medications   Medication Sig Start Date End Date Taking? Authorizing Provider  albuterol (VENTOLIN HFA) 108 (90 BASE) MCG/ACT inhaler Inhale 2 puffs into the lungs every 6 (six) hours as needed for wheezing or shortness  of breath. 06/15/14  Yes Donita Brooks, MD  amiodarone (PACERONE) 200 MG tablet Take 1 tablet (200 mg total) by mouth daily. 11/13/13  Yes Donita Brooks, MD  atorvastatin (LIPITOR) 10 MG tablet Take 1 tablet (10 mg total) by mouth daily. 09/13/14  Yes Dayna N Dunn, PA-C  budesonide-formoterol (SYMBICORT) 160-4.5 MCG/ACT inhaler Inhale 2 puffs into the lungs 2 (two) times daily. 02/24/14  Yes Donita Brooks, MD  diltiazem (TAZTIA XT) 360 MG 24 hr capsule Take 360 mg by mouth daily.   Yes Historical Provider, MD  esomeprazole (NEXIUM) 40 MG capsule Take 40 mg by mouth daily at 12 noon.   Yes Historical Provider, MD  furosemide (LASIX) 40 MG tablet Take 1 tablet (40 mg total) by mouth daily. 09/08/14  Yes Ok Anis, NP  linagliptin (TRADJENTA) 5 MG TABS tablet Take 1 tablet (5 mg total) by mouth daily. 02/24/14  Yes Donita Brooks, MD  metolazone (ZAROXOLYN) 2.5 MG tablet One tablet Monday, Wednesday and friday 09/08/14  Yes Ok Anis, NP  metoprolol succinate (TOPROL-XL) 25 MG 24 hr tablet Take 75 mg by mouth daily.   Yes Historical Provider, MD  Rivaroxaban (XARELTO) 15 MG TABS tablet Take 1 tablet (15 mg total) by mouth daily. 04/23/14  Yes Donita Brooks, MD  tiotropium (SPIRIVA HANDIHALER) 18 MCG inhalation capsule Place 1 capsule (18 mcg total) into inhaler and inhale daily. 02/24/14  Yes Donita Brooks, MD    Review of Systems  Mild malleolar edema as noted above.  Chronic, stable DOE after walking 100 ft.  He denies chest pain, palpitations, pnd, orthopnea, n, v, dizziness, syncope, weight gain, or early satiety.  All other systems reviewed and are otherwise negative except as noted above.  Physical Exam  Blood pressure 100/58, pulse 71, height  (1.702 m), weight 140 lb 3.2 oz (63.594 kg), SpO2 93 %.  General: Pleasant, NAD Psych: Normal affect. Neuro: Alert and oriented X 3. Moves all extremities spontaneously. HEENT: Normal  Neck: Supple without bruits  or JVD. Lungs:  Resp regular and unlabored, CTA. Heart: RRR no s3, s4, or murmurs. Abdomen: Soft, non-tender, non-distended, BS + x 4.  Extremities: No clubbing, cyanosis.  1+ bilat malleolar edema. DP/PT/Radials 2+ and equal bilaterally.  Accessory Clinical Findings  Lab Results  Component Value Date   CREATININE 3.2* 09/20/2014   BUN 44* 09/20/2014   NA 133* 09/20/2014   K 3.7 09/20/2014   CL 95* 09/20/2014   CO2 29 09/20/2014   Assessment & Plan  1.  Chronic diastolic CHF:  Volume status much improved on lasix 40 daily and metolazone 2.5 mg MWF.  BUN/Creat relatively stable on 1/4.  Will check again today as  sodium and K were coming down on 1/4 eval.  He is not currently on potassium @ home.  Cont bb, ccb.  HR/BP well controlled.  He has f/u with nephrology on Monday.  2.  PAF:  In sinus w/ stable rate on bb/ccb.  Cont xarelto.  Cr Cl calculates out to 19 @ his current weight. He is on 15 mg daily.  If creat declines further, we will need to switch to coumadin.  3.  CKD IV:  F/U BMET today.  Prev on ARB but this was stopped during hospitalization in December.  He has f/u with nephrology on Monday.  4.  DM II:  On tradjenta - followed by PCP.  5.  COPD:  No active wheezing.  6.  HL:  Back on statin after elevated LFT's in December (normalized on last check).  7.  Disposition:  F/U bmet today.  Nephrology f/u on Monday.  F/U with Dr. Patty Sermons on 4-6 wks or sooner if necessary.   Nicolasa Ducking, NP 09/24/2014, 9:00 AM

## 2014-09-24 NOTE — Patient Instructions (Signed)
Your physician recommends that you continue on your current medications as directed. Please refer to the Current Medication list given to you today.  Your physician recommends that you have lab work today: bmet   Your physician recommends that you keep your scheduled follow-up appointment with Dr. Patty SermonsBrackbill

## 2014-09-28 ENCOUNTER — Other Ambulatory Visit: Payer: Self-pay | Admitting: Nephrology

## 2014-09-28 DIAGNOSIS — N184 Chronic kidney disease, stage 4 (severe): Secondary | ICD-10-CM

## 2014-09-30 DIAGNOSIS — N184 Chronic kidney disease, stage 4 (severe): Secondary | ICD-10-CM

## 2014-09-30 DIAGNOSIS — J410 Simple chronic bronchitis: Secondary | ICD-10-CM

## 2014-09-30 DIAGNOSIS — I10 Essential (primary) hypertension: Secondary | ICD-10-CM

## 2014-09-30 DIAGNOSIS — I480 Paroxysmal atrial fibrillation: Secondary | ICD-10-CM

## 2014-09-30 DIAGNOSIS — R740 Nonspecific elevation of levels of transaminase and lactic acid dehydrogenase [LDH]: Secondary | ICD-10-CM

## 2014-09-30 DIAGNOSIS — E785 Hyperlipidemia, unspecified: Secondary | ICD-10-CM

## 2014-10-01 ENCOUNTER — Telehealth: Payer: Self-pay | Admitting: *Deleted

## 2014-10-01 ENCOUNTER — Ambulatory Visit
Admission: RE | Admit: 2014-10-01 | Discharge: 2014-10-01 | Disposition: A | Discharge status: Home / Self Care | Payer: Medicare Other | Source: Ambulatory Visit | Attending: Nephrology | Admitting: Nephrology

## 2014-10-01 DIAGNOSIS — N184 Chronic kidney disease, stage 4 (severe): Secondary | ICD-10-CM

## 2014-10-01 MED ORDER — CYCLOBENZAPRINE HCL 10 MG PO TABS
10.0000 mg | ORAL_TABLET | Freq: Three times a day (TID) | ORAL | Status: DC | PRN
Start: 2014-10-01 — End: 2014-11-04

## 2014-10-01 NOTE — Telephone Encounter (Signed)
Due to kidneys, his best option is flexeril 10 mg poq8 hrs prn muscle pain.

## 2014-10-01 NOTE — Telephone Encounter (Signed)
Pt aware and Rx to pharmacy

## 2014-10-01 NOTE — Telephone Encounter (Signed)
Pt called stating that he had fallen a week ago Thursday and pulled something in his groin and says he is now in some pain pt thought he was getting better but now groin area is swollen and painful, would like to know if you can prescribe him something for pain.  [facility] [address]

## 2014-10-05 ENCOUNTER — Telehealth: Payer: Self-pay | Admitting: *Deleted

## 2014-10-05 NOTE — Telephone Encounter (Signed)
Received request from pharmacy for PA on Flexeril  PA submitted.   Dx: [id number]- muscle spasms

## 2014-10-06 NOTE — Telephone Encounter (Signed)
Received PA determination.   PA approved.   10/05/2014- 10/05/2015.

## 2014-10-07 ENCOUNTER — Emergency Department (HOSPITAL_COMMUNITY): Payer: Medicare Other

## 2014-10-07 ENCOUNTER — Encounter (HOSPITAL_COMMUNITY): Payer: Self-pay | Admitting: Emergency Medicine

## 2014-10-07 ENCOUNTER — Ambulatory Visit: Payer: Self-pay | Admitting: Family Medicine

## 2014-10-07 ENCOUNTER — Emergency Department (HOSPITAL_COMMUNITY)
Admission: EM | Admit: 2014-10-07 | Discharge: 2014-10-07 | Disposition: A | Discharge status: Home / Self Care | Payer: Medicare Other | Attending: Emergency Medicine | Admitting: Emergency Medicine

## 2014-10-07 DIAGNOSIS — Y9389 Activity, other specified: Secondary | ICD-10-CM | POA: Insufficient documentation

## 2014-10-07 DIAGNOSIS — I5032 Chronic diastolic (congestive) heart failure: Secondary | ICD-10-CM | POA: Insufficient documentation

## 2014-10-07 DIAGNOSIS — Y998 Other external cause status: Secondary | ICD-10-CM | POA: Insufficient documentation

## 2014-10-07 DIAGNOSIS — Z7951 Long term (current) use of inhaled steroids: Secondary | ICD-10-CM | POA: Insufficient documentation

## 2014-10-07 DIAGNOSIS — E785 Hyperlipidemia, unspecified: Secondary | ICD-10-CM | POA: Insufficient documentation

## 2014-10-07 DIAGNOSIS — Z8701 Personal history of pneumonia (recurrent): Secondary | ICD-10-CM | POA: Diagnosis not present

## 2014-10-07 DIAGNOSIS — J449 Chronic obstructive pulmonary disease, unspecified: Secondary | ICD-10-CM | POA: Insufficient documentation

## 2014-10-07 DIAGNOSIS — K219 Gastro-esophageal reflux disease without esophagitis: Secondary | ICD-10-CM | POA: Diagnosis not present

## 2014-10-07 DIAGNOSIS — I951 Orthostatic hypotension: Secondary | ICD-10-CM | POA: Diagnosis not present

## 2014-10-07 DIAGNOSIS — S79911A Unspecified injury of right hip, initial encounter: Secondary | ICD-10-CM | POA: Diagnosis not present

## 2014-10-07 DIAGNOSIS — Z8739 Personal history of other diseases of the musculoskeletal system and connective tissue: Secondary | ICD-10-CM | POA: Diagnosis not present

## 2014-10-07 DIAGNOSIS — I129 Hypertensive chronic kidney disease with stage 1 through stage 4 chronic kidney disease, or unspecified chronic kidney disease: Secondary | ICD-10-CM | POA: Diagnosis not present

## 2014-10-07 DIAGNOSIS — R29898 Other symptoms and signs involving the musculoskeletal system: Secondary | ICD-10-CM

## 2014-10-07 DIAGNOSIS — N183 Chronic kidney disease, stage 3 (moderate): Secondary | ICD-10-CM | POA: Diagnosis not present

## 2014-10-07 DIAGNOSIS — W1839XA Other fall on same level, initial encounter: Secondary | ICD-10-CM | POA: Insufficient documentation

## 2014-10-07 DIAGNOSIS — S8991XA Unspecified injury of right lower leg, initial encounter: Secondary | ICD-10-CM | POA: Insufficient documentation

## 2014-10-07 DIAGNOSIS — M25561 Pain in right knee: Secondary | ICD-10-CM

## 2014-10-07 DIAGNOSIS — Z79899 Other long term (current) drug therapy: Secondary | ICD-10-CM | POA: Insufficient documentation

## 2014-10-07 DIAGNOSIS — Z87891 Personal history of nicotine dependence: Secondary | ICD-10-CM | POA: Diagnosis not present

## 2014-10-07 DIAGNOSIS — M25551 Pain in right hip: Secondary | ICD-10-CM

## 2014-10-07 DIAGNOSIS — E119 Type 2 diabetes mellitus without complications: Secondary | ICD-10-CM | POA: Insufficient documentation

## 2014-10-07 DIAGNOSIS — W19XXXA Unspecified fall, initial encounter: Secondary | ICD-10-CM

## 2014-10-07 DIAGNOSIS — Y9289 Other specified places as the place of occurrence of the external cause: Secondary | ICD-10-CM | POA: Insufficient documentation

## 2014-10-07 DIAGNOSIS — R42 Dizziness and giddiness: Secondary | ICD-10-CM | POA: Diagnosis present

## 2014-10-07 LAB — URINALYSIS, ROUTINE W REFLEX MICROSCOPIC
Bilirubin Urine: NEGATIVE
Glucose, UA: NEGATIVE mg/dL
Hgb urine dipstick: NEGATIVE
KETONES UR: NEGATIVE mg/dL
LEUKOCYTES UA: NEGATIVE
Nitrite: NEGATIVE
Protein, ur: NEGATIVE mg/dL
SPECIFIC GRAVITY, URINE: 1.01 (ref 1.005–1.030)
UROBILINOGEN UA: 0.2 mg/dL (ref 0.0–1.0)
pH: 6 (ref 5.0–8.0)

## 2014-10-07 LAB — CBC
HEMATOCRIT: 32.1 % — AB (ref 39.0–52.0)
Hemoglobin: 10.3 g/dL — ABNORMAL LOW (ref 13.0–17.0)
MCH: 27 pg (ref 26.0–34.0)
MCHC: 32.1 g/dL (ref 30.0–36.0)
MCV: 84.3 fL (ref 78.0–100.0)
Platelets: 224 10*3/uL (ref 150–400)
RBC: 3.81 MIL/uL — ABNORMAL LOW (ref 4.22–5.81)
RDW: 21 % — AB (ref 11.5–15.5)
WBC: 8.1 10*3/uL (ref 4.0–10.5)

## 2014-10-07 LAB — I-STAT TROPONIN, ED
TROPONIN I, POC: 0 ng/mL (ref 0.00–0.08)
Troponin i, poc: 0.01 ng/mL (ref 0.00–0.08)

## 2014-10-07 LAB — BASIC METABOLIC PANEL
ANION GAP: 8 (ref 5–15)
BUN: 36 mg/dL — AB (ref 6–23)
CO2: 29 mmol/L (ref 19–32)
CREATININE: 3.42 mg/dL — AB (ref 0.50–1.35)
Calcium: 9 mg/dL (ref 8.4–10.5)
Chloride: 95 mEq/L — ABNORMAL LOW (ref 96–112)
GFR calc Af Amer: 19 mL/min — ABNORMAL LOW (ref >90)
GFR calc non Af Amer: 17 mL/min — ABNORMAL LOW (ref >90)
Glucose, Bld: 144 mg/dL — ABNORMAL HIGH (ref 70–99)
Potassium: 4.3 mmol/L (ref 3.5–5.1)
Sodium: 132 mmol/L — ABNORMAL LOW (ref 135–145)

## 2014-10-07 MED ORDER — FENTANYL CITRATE 0.05 MG/ML IJ SOLN
25.0000 ug | Freq: Once | INTRAMUSCULAR | Status: AC
  Administered 2014-10-07: 25 ug via INTRAVENOUS
  Filled 2014-10-07: qty 2

## 2014-10-07 MED ORDER — SODIUM CHLORIDE 0.9 % IV BOLUS (SEPSIS)
500.0000 mL | Freq: Once | INTRAVENOUS | Status: AC
  Administered 2014-10-07: 500 mL via INTRAVENOUS

## 2014-10-07 MED ORDER — TRAMADOL HCL 50 MG PO TABS: 50.0000 mg | ORAL_TABLET | Freq: Four times a day (QID) | ORAL | Status: DC | PRN

## 2014-10-07 NOTE — ED Notes (Signed)
Pt reports multiple falls recently. sts he gets dizzy and loses balance. Pt c.o R knee and hip pain. Pt also reports exertional sob with productive cough.

## 2014-10-07 NOTE — Discharge Instructions (Signed)
Orthostatic Hypotension Orthostatic hypotension is a sudden drop in blood pressure. It happens when you quickly stand up from a seated or lying position. You may feel dizzy or light-headed. This can last for just a few seconds or for up to a few minutes. It is usually not a serious problem. However, if this happens frequently or gets worse, it can be a sign of something more serious. CAUSES  Different things can cause orthostatic hypotension, including:   Loss of body fluids (dehydration).  Medicines that lower blood pressure.  Sudden changes in posture, such as standing up quickly after you have been sitting or lying down.  Taking too much of your medicine. SIGNS AND SYMPTOMS   Light-headedness or dizziness.   Fainting or near-fainting.   A fast heart rate.   Weakness.   Feeling tired (fatigue).  DIAGNOSIS  Your health care provider may do several things to help diagnose your condition and identify the cause. These may include:   Taking a medical history and doing a physical exam.  Checking your blood pressure. Your health care provider will check your blood pressure when you are:  Lying down.  Sitting.  Standing.  Using tilt table testing. In this test, you lie down on a table that moves from a lying position to a standing position. You will be strapped onto the table. This test monitors your blood pressure and heart rate when you are in different positions. TREATMENT  Treatment will vary depending on the cause. Possible treatments include:   Changing the dosage of your medicines.  Wearing compression stockings on your lower legs.  Standing up slowly after sitting or lying down.  Eating more salt.  Eating frequent, small meals.  In some cases, getting IV fluids.  Taking medicine to enhance fluid retention. HOME CARE INSTRUCTIONS  Only take over-the-counter or prescription medicines as directed by your health care provider.  Follow your health care  provider's instructions for changing the dosage of your current medicines.  Do not stop or adjust your medicine on your own.  Stand up slowly after sitting or lying down. This allows your body to adjust to the different position.  Wear compression stockings as directed.  Eat extra salt as directed.  Do not add extra salt to your diet unless directed to by your health care provider.  Eat frequent, small meals.  Avoid standing suddenly after eating.  Avoid hot showers or excessive heat as directed by your health care provider.  Keep all follow-up appointments. SEEK MEDICAL CARE IF:  You continue to feel dizzy or light-headed after standing.  You feel groggy or confused.  You feel cold, clammy, or sick to your stomach (nauseous).  You have blurred vision.  You feel short of breath. SEEK IMMEDIATE MEDICAL CARE IF:   You faint after standing.  You have chest pain.  You have difficulty breathing.   You lose feeling or movement in your arms or legs.   You have slurred speech or difficulty talking, or you are unable to talk.  MAKE SURE YOU:   Understand these instructions.  Will watch your condition.  Will get help right away if you are not doing well or get worse. Document Released: 08/24/2002 Document Revised: 09/08/2013 Document Reviewed: 06/26/2013 [facility] Patient Information 2015 [place], [place]. This information is not intended to replace advice given to you by your health care provider. Make sure you discuss any questions you have with your health care provider.   Knee Pain The knee is the  complex joint between your thigh and your lower leg. It is made up of bones, tendons, ligaments, and cartilage. The bones that make up the knee are:  The femur in the thigh.  The tibia and fibula in the lower leg.  The patella or kneecap riding in the groove on the lower femur. CAUSES  Knee pain is a common complaint with many causes. A few of these causes  are:  Injury, such as:  A ruptured ligament or tendon injury.  Torn cartilage.  Medical conditions, such as:  Gout  Arthritis  Infections  Overuse, over training, or overdoing a physical activity. Knee pain can be minor or severe. Knee pain can accompany debilitating injury. Minor knee problems often respond well to self-care measures or get well on their own. More serious injuries may need medical intervention or even surgery. SYMPTOMS The knee is complex. Symptoms of knee problems can vary widely. Some of the problems are:  Pain with movement and weight bearing.  Swelling and tenderness.  Buckling of the knee.  Inability to straighten or extend your knee.  Your knee locks and you cannot straighten it.  Warmth and redness with pain and fever.  Deformity or dislocation of the kneecap. DIAGNOSIS  Determining what is wrong may be very straight forward such as when there is an injury. It can also be challenging because of the complexity of the knee. Tests to make a diagnosis may include:  Your caregiver taking a history and doing a physical exam.  Routine X-rays can be used to rule out other problems. X-rays will not reveal a cartilage tear. Some injuries of the knee can be diagnosed by:  Arthroscopy a surgical technique by which a small video camera is inserted through tiny incisions on the sides of the knee. This procedure is used to examine and repair internal knee joint problems. Tiny instruments can be used during arthroscopy to repair the torn knee cartilage (meniscus).  Arthrography is a radiology technique. A contrast liquid is directly injected into the knee joint. Internal structures of the knee joint then become visible on X-ray film.  An MRI scan is a non X-ray radiology procedure in which magnetic fields and a computer produce two- or three-dimensional images of the inside of the knee. Cartilage tears are often visible using an MRI scanner. MRI scans have  largely replaced arthrography in diagnosing cartilage tears of the knee.  Blood work.  Examination of the fluid that helps to lubricate the knee joint (synovial fluid). This is done by taking a sample out using a needle and a syringe. TREATMENT The treatment of knee problems depends on the cause. Some of these treatments are:  Depending on the injury, proper casting, splinting, surgery, or physical therapy care will be needed.  Give yourself adequate recovery time. Do not overuse your joints. If you begin to get sore during workout routines, back off. Slow down or do fewer repetitions.  For repetitive activities such as cycling or running, maintain your strength and nutrition.  Alternate muscle groups. For example, if you are a weight lifter, work the upper body on one day and the lower body the next.  Either tight or weak muscles do not give the proper support for your knee. Tight or weak muscles do not absorb the stress placed on the knee joint. Keep the muscles surrounding the knee strong.  Take care of mechanical problems.  If you have flat feet, orthotics or special shoes may help. See your caregiver if you  need help.  Arch supports, sometimes with wedges on the inner or outer aspect of the heel, can help. These can shift pressure away from the side of the knee most bothered by osteoarthritis.  A brace called an "unloader" brace also may be used to help ease the pressure on the most arthritic side of the knee.  If your caregiver has prescribed crutches, braces, wraps or ice, use as directed. The acronym for this is PRICE. This means protection, rest, ice, compression, and elevation.  Nonsteroidal anti-inflammatory drugs (NSAIDs), can help relieve pain. But if taken immediately after an injury, they may actually increase swelling. Take NSAIDs with food in your stomach. Stop them if you develop stomach problems. Do not take these if you have a history of ulcers, stomach pain, or  bleeding from the bowel. Do not take without your caregiver's approval if you have problems with fluid retention, heart failure, or kidney problems.  For ongoing knee problems, physical therapy may be helpful.  Glucosamine and chondroitin are over-the-counter dietary supplements. Both may help relieve the pain of osteoarthritis in the knee. These medicines are different from the usual anti-inflammatory drugs. Glucosamine may decrease the rate of cartilage destruction.  Injections of a corticosteroid drug into your knee joint may help reduce the symptoms of an arthritis flare-up. They may provide pain relief that lasts a few months. You may have to wait a few months between injections. The injections do have a small increased risk of infection, water retention, and elevated blood sugar levels.  Hyaluronic acid injected into damaged joints may ease pain and provide lubrication. These injections may work by reducing inflammation. A series of shots may give relief for as long as 6 months.  Topical painkillers. Applying certain ointments to your skin may help relieve the pain and stiffness of osteoarthritis. Ask your pharmacist for suggestions. Many over the-counter products are approved for temporary relief of arthritis pain.  In some countries, doctors often prescribe topical NSAIDs for relief of chronic conditions such as arthritis and tendinitis. A review of treatment with NSAID creams found that they worked as well as oral medications but without the serious side effects. PREVENTION  Maintain a healthy weight. Extra pounds put more strain on your joints.  Get strong, stay limber. Weak muscles are a common cause of knee injuries. Stretching is important. Include flexibility exercises in your workouts.  Be smart about exercise. If you have osteoarthritis, chronic knee pain or recurring injuries, you may need to change the way you exercise. This does not mean you have to stop being active. If your  knees ache after jogging or playing basketball, consider switching to swimming, water aerobics, or other low-impact activities, at least for a few days a week. Sometimes limiting high-impact activities will provide relief.  Make sure your shoes fit well. Choose footwear that is right for your sport.  Protect your knees. Use the proper gear for knee-sensitive activities. Use kneepads when playing volleyball or laying carpet. Buckle your seat belt every time you drive. Most shattered kneecaps occur in car accidents.  Rest when you are tired. SEEK MEDICAL CARE IF:  You have knee pain that is continual and does not seem to be getting better.  SEEK IMMEDIATE MEDICAL CARE IF:  Your knee joint feels hot to the touch and you have a high fever. MAKE SURE YOU:   Understand these instructions.  Will watch your condition.  Will get help right away if you are not doing well or get worse.  Document Released: 07/01/2007 Document Revised: 11/26/2011 Document Reviewed: 07/01/2007 [facility] Patient Information 2015 [place], [place]. This information is not intended to replace advice given to you by your health care provider. Make sure you discuss any questions you have with your health care provider.    RICE: Routine Care for Injuries The routine care of many injuries includes Rest, Ice, Compression, and Elevation (RICE). HOME CARE INSTRUCTIONS  Rest is needed to allow your body to heal. Routine activities can usually be resumed when comfortable. Injured tendons and bones can take up to 6 weeks to heal. Tendons are the cord-like structures that attach muscle to bone.  Ice following an injury helps keep the swelling down and reduces pain.  Put ice in a plastic bag.  Place a towel between your skin and the bag.  Leave the ice on for 15-20 minutes, 3-4 times a day, or as directed by your health care provider. Do this while awake, for the first 24 to 48 hours. After that, continue as directed by your  caregiver.  Compression helps keep swelling down. It also gives support and helps with discomfort. If an elastic bandage has been applied, it should be removed and reapplied every 3 to 4 hours. It should not be applied tightly, but firmly enough to keep swelling down. Watch fingers or toes for swelling, bluish discoloration, coldness, numbness, or excessive pain. If any of these problems occur, remove the bandage and reapply loosely. Contact your caregiver if these problems continue.  Elevation helps reduce swelling and decreases pain. With extremities, such as the arms, hands, legs, and feet, the injured area should be placed near or above the level of the heart, if possible. SEEK IMMEDIATE MEDICAL CARE IF:  You have persistent pain and swelling.  You develop redness, numbness, or unexpected weakness.  Your symptoms are getting worse rather than improving after several days. These symptoms may indicate that further evaluation or further X-rays are needed. Sometimes, X-rays may not show a small broken bone (fracture) until 1 week or 10 days later. Make a follow-up appointment with your caregiver. Ask when your X-ray results will be ready. Make sure you get your X-ray results. Document Released: 12/16/2000 Document Revised: 09/08/2013 Document Reviewed: 02/02/2011 [facility] Patient Information 2015 [place], [place]. This information is not intended to replace advice given to you by your health care provider. Make sure you discuss any questions you have with your health care provider.

## 2014-10-07 NOTE — ED Notes (Signed)
While ambulating pt, pt was able to walk with no pain, but the R/knee did get weak every few steps. Pt wife request that he get a walker to help with ambulating at home. Pt O2 sat stay at 95% the whole time while ambulating.

## 2014-10-07 NOTE — ED Provider Notes (Signed)
TIME SEEN: 7:25 PM  CHIEF COMPLAINT: Lightheadedness, fall, cough  HPI: Patient is a 70 y.o. M with history of hypertension, hyperlipidemia, chronic kidney disease, paroxysmal atrial fibrillation, diabetes, COPD, CHF, AAA who presents emergency department with complaints of feeling lightheaded for the past several days. Patient's daughter reports that it is been a tough balance to keep him hydrated but also keep him diuresed enough to not being a heart failure exacerbation. He has had his Lasix recently adjusted. He denies any chest pain or increase from his chronic shortness of breath. He has had a dry cough recently. No fever. Reports that over the past several days he's been lightheaded and had 2 falls today with ambulating. Reports lightheadedness is worse with standing. States he fell on his right side and hurt his right knee and right hip. Is not sure if he hit his head but denies loss of consciousness. No new numbness, tingling or focal weakness. No other new changes in medications. Denies abdominal pain. No bloody stool or melena. No dysuria or hematuria.  ROS: See HPI Constitutional: no fever  Eyes: no drainage  ENT: no runny nose   Cardiovascular:  no chest pain  Resp: no SOB  GI: no vomiting GU: no dysuria Integumentary: no rash  Allergy: no hives  Musculoskeletal: no leg swelling  Neurological: no slurred speech ROS otherwise negative  PAST MEDICAL HISTORY/PAST SURGICAL HISTORY:  Past Medical History  Diagnosis Date  . GERD (gastroesophageal reflux disease)   . Hyperlipidemia   . H/O hiatal hernia   . Hypertension     Sees Dr. Lynnea Ferrier  . AAA (abdominal aortic aneurysm)     a. 09/2013: Resection and grafting of abdominal aortic aneurysm with insertion of an aorto by common iliac graft using 18 x 9 mm Hemashield Dacron graft.  Marland Kitchen COPD (chronic obstructive pulmonary disease)   . Chronic diastolic CHF (congestive heart failure)     a.  Echo (08/13/2013): EF 55-60%, normal  wall motion, mild MR, mild LAE  . Pneumonia   . Arthritis     "little in my hands" (09/08/2013)  . Chronic kidney disease (CKD), stage III (moderate)   . PAF (paroxysmal atrial fibrillation)     a. amiodarone Rx started 08/2013;  b. s/p TEE-DCCV 09/2013 => recurrent AFib => repeat DCCV => NSR;   c. Xarelto 15 QD due to CKD  . Hx of cardiovascular stress test     a. Myoview 09/2012:  no scar or ischemia  . 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 Authorizing Provider  albuterol (VENTOLIN HFA) 108 (90 BASE) MCG/ACT inhaler Inhale 2 puffs into the lungs every 6 (six) hours as needed for wheezing or shortness of breath. 06/15/14  Yes Donita Brooks, MD  amiodarone (PACERONE) 200 MG tablet Take 1 tablet (200 mg total) by mouth daily. 11/13/13  Yes Donita Brooks, MD  atorvastatin (LIPITOR) 10 MG tablet Take 1 tablet (10 mg total) by mouth daily. 09/13/14  Yes Dayna N Dunn, PA-C  budesonide-formoterol (SYMBICORT) 160-4.5 MCG/ACT inhaler Inhale 2 puffs into the lungs 2 (two) times daily. 02/24/14  Yes Donita Brooks, MD  cyclobenzaprine (FLEXERIL) 10 MG tablet Take 1 tablet (10 mg total) by mouth 3 (three) times daily as needed for muscle spasms. 10/01/14  Yes Donita Brooks, MD  diltiazem (TAZTIA XT) 360 MG 24 hr capsule Take 360  mg by mouth daily.   Yes Historical Provider, MD  esomeprazole (NEXIUM) 40 MG capsule Take 40 mg by mouth daily at 12 noon.   Yes Historical Provider, MD  furosemide (LASIX) 40 MG tablet Take 1 tablet (40 mg total) by mouth daily. 09/08/14  Yes Ok Anis, NP  linagliptin (TRADJENTA) 5 MG TABS tablet Take 1 tablet (5 mg total) by mouth daily. 02/24/14  Yes Donita Brooks, MD  metoprolol succinate (TOPROL-XL) 25 MG 24 hr tablet Take 75 mg by mouth daily.   Yes Historical Provider, MD  Rivaroxaban (XARELTO) 15 MG TABS tablet Take 1 tablet (15 mg total) by mouth  daily. 04/23/14  Yes Donita Brooks, MD  tamsulosin (FLOMAX) 0.4 MG CAPS capsule Take 0.4 mg by mouth daily.   Yes Historical Provider, MD  tiotropium (SPIRIVA HANDIHALER) 18 MCG inhalation capsule Place 1 capsule (18 mcg total) into inhaler and inhale daily. 02/24/14  Yes Donita Brooks, MD    ALLERGIES:  Allergies  Allergen Reactions  . Niaspan [Niacin Er] Anaphylaxis    SOCIAL HISTORY:  History  Substance Use Topics  . Smoking status: Former Smoker -- 1.00 packs/day for 56 years    Types: Cigarettes    Quit date: 07/31/2013  . Smokeless tobacco: Former Neurosurgeon    Types: Chew     Comment: 09/08/2013 "stopped smoking cigarettes ~ 2 months ago; aien't chew in probably 10 yrs"  . Alcohol Use: No    FAMILY HISTORY: Family History  Problem Relation Age of Onset  . Diabetes Mother   . Tuberculosis Mother   . Heart disease Father   . Heart attack Father   . Heart disease Son     Blood clot:  Arm    EXAM: BP 120/60 mmHg  Pulse 53  Temp(Src) 97.5 F (36.4 C) (Oral)  Resp 17  Ht  (1.676 m)  Wt 135 lb (61.236 kg)  BMI 21.80 kg/m2  SpO2 95% CONSTITUTIONAL: Alert and oriented and responds appropriately to questions. Well-appearing; well-nourished; GCS 15 HEAD: Normocephalic; atraumatic EYES: Conjunctivae clear, PERRL, EOMI ENT: normal nose; no rhinorrhea; moist mucous membranes; pharynx without lesions noted; no dental injury;  no septal hematoma NECK: Supple, no meningismus, no LAD; no midline spinal tenderness, step-off or deformity CARD: RRR; S1 and S2 appreciated; no murmurs, no clicks, no rubs, no gallops RESP: Normal chest excursion without splinting or tachypnea; breath sounds clear and equal bilaterally; no wheezes, no rhonchi, no rales; chest wall stable, nontender to palpation ABD/GI: Normal bowel sounds; non-distended; soft, non-tender, no rebound, no guarding PELVIS:  stable, nontender to palpation BACK:  The back appears normal and is non-tender to palpation,  there is no CVA tenderness; no midline spinal tenderness, step-off or deformity EXT: Tender to palpation over the right hip and right knee without obvious bony deformity, ecchymosis, swelling ; Normal ROM in all joints; otherwise extremities are non-tender to palpation; no edema; normal capillary refill; no cyanosis; 2+ radial and DP pulses bilaterally    SKIN: Normal color for age and race; warm NEURO: Moves all extremities equally, sensation to light touch intact diffusely, cranial nerves II through XII intact, normal gait PSYCH: The patient's mood and manner are appropriate. Grooming and personal hygiene are appropriate.  MEDICAL DECISION MAKING: Patient here with lightheadedness that caused him to fall twice this week. He has no focal neurologic deficits. Unsure if he hit his head. His complaint of right hip and right knee pain. We'll obtain CT imaging of his  head, cervical spine, knee and hip. We'll give fentanyl for pain. We'll also obtain basic labs, troponin, urinalysis, chest x-ray to evaluate for possible organic causes for his lightheadedness but suspected he is mildly dehydrated as he is orthostatic. We'll give gentle IV hydration and reassess.  ED PROGRESS: Patient reports feeling much better after IV fluids.  Labs unremarkable. Creatinine is elevated but this is baseline. Troponin negative. Urine shows no sign of infection. Imaging shows no acute abnormalities. Patient has now been able to ambulate without difficulty. Family is requesting that we discharge him with a walker. Have discussed with case management who will help set him up to have a walker delivered tomorrow to help with his ambulating at home. Discussed strict return precautions and supportive care instructions. We'll discharge with tramadol as needed for hip and knee pain. He verbalizes understanding and is comfortable with plan.      EKG Interpretation  Date/Time:  Thursday October 07 2014 18:30:53 EST Ventricular Rate:   55 PR Interval:  204 QRS Duration: 98 QT Interval:  490 QTC Calculation: 468 R Axis:   7 Text Interpretation:  Sinus bradycardia Otherwise normal ECG No significant change since last tracing Confirmed by Denton Derks,  DO, Reatha Sur [id number] on 10/07/2014 7:00:31 PM        Layla MawKristen N Amarra Sawyer, DO 10/08/14 [id number]

## 2014-10-07 NOTE — ED Notes (Signed)
Dr. Ward at bedside.

## 2014-10-08 ENCOUNTER — Ambulatory Visit: Payer: Self-pay | Admitting: Family Medicine

## 2014-10-08 NOTE — ED Notes (Addendum)
75 mcg of Fentanyl wasted in sharps container and witnessed by Foye ClockKristina 'Elliot GurneyWoody' Munnett, Consulting civil engineerCharge RN.

## 2014-10-09 NOTE — Progress Notes (Signed)
ED CM received consult  From Dr. Leonides Schanz concerning patient's need for a rolling walker. Presented to [facility] ED s/p fall. Met with patient at bedside to recommendation for rolling walker, patient agreeable. Offered choice list provided, [facility] selected. Verified address and best phone number, referral faxed to [facility] [phone] received fax confirmation. Explained that walker will be shipped to his home. Patient provided [facility] contact information to check on the status. Patient verbalized understanding teach back done. Dr. Leonides Schanz updated she is agreeable with disposition plan. No Further ED CM need identified

## 2014-10-11 ENCOUNTER — Encounter: Payer: Self-pay | Admitting: Family Medicine

## 2014-10-11 ENCOUNTER — Ambulatory Visit (INDEPENDENT_AMBULATORY_CARE_PROVIDER_SITE_OTHER): Payer: Medicare Other | Admitting: Family Medicine

## 2014-10-11 VITALS — BP 112/58 | HR 60 | Temp 97.6°F | Resp 20 | Ht 66.5 in | Wt 145.0 lb

## 2014-10-11 DIAGNOSIS — I480 Paroxysmal atrial fibrillation: Secondary | ICD-10-CM

## 2014-10-11 DIAGNOSIS — R42 Dizziness and giddiness: Secondary | ICD-10-CM

## 2014-10-11 DIAGNOSIS — N184 Chronic kidney disease, stage 4 (severe): Secondary | ICD-10-CM

## 2014-10-12 ENCOUNTER — Encounter: Payer: Self-pay | Admitting: Family Medicine

## 2014-10-12 NOTE — Progress Notes (Signed)
Subjective:    Patient ID: Mark [name], male    DOB: 10-28-1945, 69 y.o.   MRN: [id number]  HPI Since last time I saw the patient, he was admitted to the hospital with pulmonary edema and underwent aggressive diuresis. Subsequently thereafter his chronic kidney disease has worsened. He is now stage IV with a glomerular filtration rate less than 15. He has been seen by nephrology. His nephrologist recently started the patient on Flomax 0.4 mg by mouth daily at bedtime. There was some concern that the patient may have an element of obstruction contributing to the worsening of his kidney function. He does have symptoms of BPH. Unfortunately since starting the new medication patient has become extremely dizzy and weak. He reports presyncope and orthostatic dizziness whenever he stands up. He has fallen several times. In fact he went to the emergency room over the weekend with CT scan of the head and neck were negative for any fractures intracranial hemorrhage or stroke. Unfortunately his Hyacinth MeekerMiller filtration rate has worsened to the point that Xarelto is contraindicated. Patient has an isolated history of atrial fibrillations approximately 2 years ago during the middle of a severe COPD exacerbation. He has been in stable normal sinus rhythm ever since. He refuses the use of Coumadin. He recently has been having mod to sever recurrent epistaxis. Past Medical History  Diagnosis Date  . GERD (gastroesophageal reflux disease)   . Hyperlipidemia   . H/O hiatal hernia   . Hypertension     Sees Dr. Lynnea FerrierWarren Kasiyah Platter  . AAA (abdominal aortic aneurysm)     a. 09/2013: Resection and grafting of abdominal aortic aneurysm with insertion of an aorto by common iliac graft using 18 x 9 mm Hemashield Dacron graft.  Marland Kitchen. COPD (chronic obstructive pulmonary disease)   . Chronic diastolic CHF (congestive heart failure)     a.  Echo (08/13/2013): EF 55-60%, normal wall motion, mild MR, mild LAE  . Pneumonia   .  Arthritis     "little in my hands" (09/08/2013)  . Chronic kidney disease (CKD), stage III (moderate)   . PAF (paroxysmal atrial fibrillation)     a. amiodarone Rx started 08/2013;  b. s/p TEE-DCCV 09/2013 => recurrent AFib => repeat DCCV => NSR;   c. Xarelto 15 QD due to CKD  . Hx of cardiovascular stress test     a. Myoview 09/2012:  no scar or ischemia  . 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 Knee arthroscopy Right   . Abdominal aortic aneurysm repair  10/15/2012    Procedure: ANEURYSM ABDOMINAL AORTIC REPAIR;  Surgeon: Pryor OchoaJames D Lawson, MD;  [facility];  Service: Vascular;  Laterality: N/A;  Resection and Grafting of Abdominal Aortic Aneurysm - Aortobi-Iliac   . Tee without cardioversion N/A 09/22/2013    Procedure: TRANSESOPHAGEAL ECHOCARDIOGRAM (TEE);  Surgeon: Lars MassonKatarina H Nelson, MD;  [facility];  Service: Cardiovascular;  Laterality: N/A;  . Cardioversion N/A 09/22/2013    Procedure: CARDIOVERSION;  Surgeon: Lars MassonKatarina H Nelson, MD;  [facility];  Service: Cardiovascular;  Laterality: N/A;  . Abdominal aortic aneurysm repair  10-15-2012  . Cardioversion  Jan. 12, 2015    Dr. Doylene CanningGregg W. Ladona Ridgelaylor  . Cardioversion N/A 09/28/2013    Procedure: CARDIOVERSION;  Surgeon: Marinus MawGregg W Taylor, MD;  [facility];  Service: Cardiovascular;  Laterality: N/A;   Current Outpatient Prescriptions on File Prior to Visit  Medication Sig Dispense Refill  . albuterol (VENTOLIN HFA) 108 (90 BASE) MCG/ACT inhaler Inhale 2 puffs into the lungs every 6 (six) hours as needed for wheezing or shortness of breath. 1 Inhaler 6  . amiodarone (PACERONE) 200 MG tablet Take 1 tablet (200 mg total) by mouth daily. 30 tablet 11  . atorvastatin (LIPITOR) 10 MG tablet Take 1 tablet (10 mg total) by mouth daily. 90 tablet 3  . budesonide-formoterol (SYMBICORT) 160-4.5 MCG/ACT inhaler Inhale 2 puffs into the lungs 2 (two) times daily. 1  Inhaler 11  . cyclobenzaprine (FLEXERIL) 10 MG tablet Take 1 tablet (10 mg total) by mouth 3 (three) times daily as needed for muscle spasms. 30 tablet 0  . diltiazem (TAZTIA XT) 360 MG 24 hr capsule Take 360 mg by mouth daily.    Marland Kitchen esomeprazole (NEXIUM) 40 MG capsule Take 40 mg by mouth daily at 12 noon.    . furosemide (LASIX) 40 MG tablet Take 1 tablet (40 mg total) by mouth daily. 60 tablet 5  . linagliptin (TRADJENTA) 5 MG TABS tablet Take 1 tablet (5 mg total) by mouth daily. 30 tablet 11  . metoprolol succinate (TOPROL-XL) 25 MG 24 hr tablet Take 75 mg by mouth daily.    . Rivaroxaban (XARELTO) 15 MG TABS tablet Take 1 tablet (15 mg total) by mouth daily. 30 tablet 11  . tamsulosin (FLOMAX) 0.4 MG CAPS capsule Take 0.4 mg by mouth daily.    Marland Kitchen tiotropium (SPIRIVA HANDIHALER) 18 MCG inhalation capsule Place 1 capsule (18 mcg total) into inhaler and inhale daily. 30 capsule 11  . traMADol (ULTRAM) 50 MG tablet Take 1 tablet (50 mg total) by mouth every 6 (six) hours as needed. 15 tablet 0   No current facility-administered medications on file prior to visit.   Allergies  Allergen Reactions  . Niaspan [Niacin Er] Anaphylaxis   History   Social History  . Marital Status: Married    Spouse Name: Nicole Cella    Number of Children: 4  . Years of Education: N/A   Occupational History  . Works PT as a Education administrator    Social History Main Topics  . Smoking status: Former Smoker -- 1.00 packs/day for 56 years    Types: Cigarettes    Quit date: 07/31/2013  . Smokeless tobacco: Former Neurosurgeon    Types: Chew     Comment: 09/08/2013 "stopped smoking cigarettes ~ 2 months ago; aien't chew in probably 10 yrs"  . Alcohol Use: No  . Drug Use: No  . Sexual Activity: Not Currently   Other Topics Concern  . Not on file   Social History Narrative   Married.  Lives with wife.  Ambulates without assistance.     Review of Systems  All other systems reviewed and are negative.      Objective:    Physical Exam  Constitutional: He appears well-developed and well-nourished.  HENT:  Right Ear: External ear normal.  Left Ear: External ear normal.  Mouth/Throat: Oropharynx is clear and moist. No oropharyngeal exudate.  Eyes: Conjunctivae and EOM are normal. Pupils are equal, round, and reactive to light.  Neck: Neck supple. No JVD present.  Cardiovascular: Normal rate, regular rhythm and normal heart sounds.   Pulmonary/Chest: Effort normal. He has no wheezes. He has rales.  Abdominal: Soft. Bowel sounds are normal. He exhibits no distension. There is no tenderness. There is no rebound.  Musculoskeletal: He exhibits no edema.  Lymphadenopathy:    He has no cervical adenopathy.  Vitals reviewed.         Assessment & Plan:  Paroxysmal atrial fibrillation  CKD (chronic kidney disease) stage 4, GFR 15-29 ml/min  Orthostatic dizziness  I believe the patient's orthostatic dizziness is likely a side effect of his Flomax combined with antihypertensive medication. I recommended temporarily discontinuing the Flomax and recheck next week. If his orthostatic dizziness has improved, I will switch the patient to Avodart. I also am concerned about this patient taking Xarelto given his worsening kidney function. I communicated with his cardiologist. I have recommended that the patient discontinue Xarelto. He refuses Coumadin. Therefore we will start the patient on aspirin 325 mg by mouth daily. Also notified his cardiologist Dr. Patty Sermons.

## 2014-10-13 ENCOUNTER — Other Ambulatory Visit (HOSPITAL_COMMUNITY): Payer: Self-pay | Admitting: *Deleted

## 2014-10-14 ENCOUNTER — Telehealth: Payer: Self-pay | Admitting: Family Medicine

## 2014-10-14 ENCOUNTER — Encounter (HOSPITAL_COMMUNITY)
Admission: RE | Admit: 2014-10-14 | Discharge: 2014-10-14 | Disposition: A | Discharge status: Home / Self Care | Payer: Medicare Other | Source: Ambulatory Visit | Attending: Nephrology | Admitting: Nephrology

## 2014-10-14 DIAGNOSIS — D509 Iron deficiency anemia, unspecified: Secondary | ICD-10-CM | POA: Insufficient documentation

## 2014-10-14 MED ORDER — UNABLE TO FIND: 1.0000 | Status: DC | PRN

## 2014-10-14 MED ORDER — SODIUM CHLORIDE 0.9 % IV SOLN
510.0000 mg | INTRAVENOUS | Status: DC
  Administered 2014-10-14: 510 mg via INTRAVENOUS
  Filled 2014-10-14: qty 17

## 2014-10-14 NOTE — Telephone Encounter (Signed)
Needs RX for walker to [facility].  Wants walker with the seat on it.

## 2014-10-14 NOTE — Telephone Encounter (Signed)
[facility] still says they have not received any RX for walker.  [facility] gong [facility].

## 2014-10-14 NOTE — Discharge Instructions (Signed)

## 2014-10-14 NOTE — Telephone Encounter (Signed)
Rx printed for walker and faxed to [facility].

## 2014-10-18 ENCOUNTER — Ambulatory Visit (INDEPENDENT_AMBULATORY_CARE_PROVIDER_SITE_OTHER): Payer: Medicare Other | Admitting: Family Medicine

## 2014-10-18 ENCOUNTER — Encounter: Payer: Self-pay | Admitting: Family Medicine

## 2014-10-18 VITALS — BP 130/68 | HR 68 | Temp 97.6°F | Resp 20 | Wt 145.0 lb

## 2014-10-18 DIAGNOSIS — N400 Enlarged prostate without lower urinary tract symptoms: Secondary | ICD-10-CM

## 2014-10-18 DIAGNOSIS — E119 Type 2 diabetes mellitus without complications: Secondary | ICD-10-CM

## 2014-10-18 MED ORDER — SILODOSIN 4 MG PO CAPS: 4.0000 mg | ORAL_CAPSULE | Freq: Every day | ORAL | Status: DC

## 2014-10-18 NOTE — Progress Notes (Signed)
Subjective:    Patient ID: Mark [name], male    DOB: 1946/01/12, 69 y.o.   MRN: [id number]  HPI 10/11/14 Since last time I saw the patient, he was admitted to the hospital with pulmonary edema and underwent aggressive diuresis. Subsequently thereafter his chronic kidney disease has worsened. He is now stage IV with a glomerular filtration rate less than 15. He has been seen by nephrology. His nephrologist recently started the patient on Flomax 0.4 mg by mouth daily at bedtime. There was some concern that the patient may have an element of obstruction contributing to the worsening of his kidney function. He does have symptoms of BPH. Unfortunately since starting the new medication patient has become extremely dizzy and weak. He reports presyncope and orthostatic dizziness whenever he stands up. He has fallen several times. In fact he went to the emergency room over the weekend with CT scan of the head and neck were negative for any fractures intracranial hemorrhage or stroke. Unfortunately his Hyacinth Meeker filtration rate has worsened to the point that Xarelto is contraindicated. Patient has an isolated history of atrial fibrillations approximately 2 years ago during the middle of a severe COPD exacerbation. He has been in stable normal sinus rhythm ever since. He refuses the use of Coumadin. He recently has been having mod to sever recurrent epistaxis.  At that time, my plan was: I believe the patient's orthostatic dizziness is likely a side effect of his Flomax combined with antihypertensive medication. I recommended temporarily discontinuing the Flomax and recheck next week. If his orthostatic dizziness has improved, I will switch the patient to Avodart. I also am concerned about this patient taking Xarelto given his worsening kidney function. I communicated with his cardiologist. I have recommended that the patient discontinue Xarelto. He refuses Coumadin. Therefore we will start the patient on  aspirin 325 mg by mouth daily. Also notified his cardiologist Dr. Patty Sermons.    10/18/14  since discontinuing the Flomax, the patient's dizziness has improved. His orthostasis has improved. He is able to perform heel to toe walk in the hallway much better than he was  Last week. He denies orthostatic dizziness today. He continues to complain of fatigue /easy fatigability but overall is feeling better. Past Medical History  Diagnosis Date  . GERD (gastroesophageal reflux disease)   . Hyperlipidemia   . H/O hiatal hernia   . Hypertension     Sees Dr. Lynnea Ferrier  . AAA (abdominal aortic aneurysm)     a. 09/2013: Resection and grafting of abdominal aortic aneurysm with insertion of an aorto by common iliac graft using 18 x 9 mm Hemashield Dacron graft.  Marland Kitchen COPD (chronic obstructive pulmonary disease)   . Chronic diastolic CHF (congestive heart failure)     a.  Echo (08/13/2013): EF 55-60%, normal wall motion, mild MR, mild LAE  . Pneumonia   . Arthritis     "little in my hands" (09/08/2013)  . Chronic kidney disease (CKD), stage III (moderate)   . PAF (paroxysmal atrial fibrillation)     a. amiodarone Rx started 08/2013;  b. s/p TEE-DCCV 09/2013 => recurrent AFib => repeat DCCV => NSR;   c. Xarelto 15 QD due to CKD  . Hx of cardiovascular stress test     a. Myoview 09/2012:  no scar or ischemia  . 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 Knee arthroscopy Right   . Abdominal aortic  aneurysm repair  10/15/2012    Procedure: ANEURYSM ABDOMINAL AORTIC REPAIR;  Surgeon: Pryor Ochoa, MD;  Location: [facility] OR;  Service: Vascular;  Laterality: N/A;  Resection and Grafting of Abdominal Aortic Aneurysm - Aortobi-Iliac   . Tee without cardioversion N/A 09/22/2013    Procedure: TRANSESOPHAGEAL ECHOCARDIOGRAM (TEE);  Surgeon: Lars Masson, MD;  Location: [facility] ENDOSCOPY;  Service: Cardiovascular;  Laterality: N/A;  . Cardioversion N/A  09/22/2013    Procedure: CARDIOVERSION;  Surgeon: Lars Masson, MD;  Location[facility] ENDOSCOPY;  Service: Cardiovascular;  Laterality: N/A;  . Abdominal aortic aneurysm repair  10-15-2012  . Cardioversion  Jan. 12, 2015    Dr. Doylene Canning. Ladona Ridgel  . Cardioversion N/A 09/28/2013    Procedure: CARDIOVERSION;  Surgeon: Marinus Maw, MD;  Location: [facility] CATH LAB;  Service: Cardiovascular;  Laterality: N/A;   Current Outpatient Prescriptions on File Prior to Visit  Medication Sig Dispense Refill  . albuterol (VENTOLIN HFA) 108 (90 BASE) MCG/ACT inhaler Inhale 2 puffs into the lungs every 6 (six) hours as needed for wheezing or shortness of breath. 1 Inhaler 6  . amiodarone (PACERONE) 200 MG tablet Take 1 tablet (200 mg total) by mouth daily. 30 tablet 11  . atorvastatin (LIPITOR) 10 MG tablet Take 1 tablet (10 mg total) by mouth daily. 90 tablet 3  . budesonide-formoterol (SYMBICORT) 160-4.5 MCG/ACT inhaler Inhale 2 puffs into the lungs 2 (two) times daily. 1 Inhaler 11  . cyclobenzaprine (FLEXERIL) 10 MG tablet Take 1 tablet (10 mg total) by mouth 3 (three) times daily as needed for muscle spasms. 30 tablet 0  . diltiazem (TAZTIA XT) 360 MG 24 hr capsule Take 360 mg by mouth daily.    Marland Kitchen esomeprazole (NEXIUM) 40 MG capsule Take 40 mg by mouth daily at 12 noon.    . furosemide (LASIX) 40 MG tablet Take 1 tablet (40 mg total) by mouth daily. 60 tablet 5  . linagliptin (TRADJENTA) 5 MG TABS tablet Take 1 tablet (5 mg total) by mouth daily. 30 tablet 11  . metoprolol succinate (TOPROL-XL) 25 MG 24 hr tablet Take 75 mg by mouth daily.    Marland Kitchen tiotropium (SPIRIVA HANDIHALER) 18 MCG inhalation capsule Place 1 capsule (18 mcg total) into inhaler and inhale daily. 30 capsule 11  . traMADol (ULTRAM) 50 MG tablet Take 1 tablet (50 mg total) by mouth every 6 (six) hours as needed. 15 tablet 0  . UNABLE TO FIND 1 each by Other route as needed. Dispense One Nurse, learning disability. ICD I50.33, J96.20,M19.90 1 each 0    No current facility-administered medications on file prior to visit.   Allergies  Allergen Reactions  . Niaspan [Niacin Er] Anaphylaxis   History   Social History  . Marital Status: Married    Spouse Name: Nicole Cella    Number of Children: 4  . Years of Education: N/A   Occupational History  . Works PT as a Education administrator    Social History Main Topics  . Smoking status: Former Smoker -- 1.00 packs/day for 56 years    Types: Cigarettes    Quit date: 07/31/2013  . Smokeless tobacco: Former Neurosurgeon    Types: Chew     Comment: 09/08/2013 "stopped smoking cigarettes ~ 2 months ago; aien't chew in probably 10 yrs"  . Alcohol Use: No  . Drug Use: No  . Sexual Activity: Not Currently   Other Topics Concern  . Not on file   Social History Narrative   Married.  Lives with wife.  Ambulates without assistance.     Review of Systems  All other systems reviewed and are negative.      Objective:   Physical Exam  Constitutional: He appears well-developed and well-nourished.  HENT:  Right Ear: External ear normal.  Left Ear: External ear normal.  Mouth/Throat: Oropharynx is clear and moist. No oropharyngeal exudate.  Eyes: Conjunctivae and EOM are normal. Pupils are equal, round, and reactive to light.  Neck: Neck supple. No JVD present.  Cardiovascular: Normal rate, regular rhythm and normal heart sounds.   Pulmonary/Chest: Effort normal. He has no wheezes. He has rales.  Abdominal: Soft. Bowel sounds are normal. He exhibits no distension. There is no tenderness. There is no rebound.  Musculoskeletal: He exhibits no edema.  Lymphadenopathy:    He has no cervical adenopathy.  Vitals reviewed.         Assessment & Plan:  BPH (benign prostatic hyperplasia) - Plan: silodosin (RAPAFLO) 4 MG CAPS capsule  I will start the patient on Rapaflo 4 mg by mouth daily for BPH and to try to improve any obstructive uropathy the patient may have said that when he sees his nephrologist at the  end of February hopefully we'll see improvement in his kidney function. The incidence of orthostatic dizziness is much lower on Rapaflo than compared to Flomax. If he continues to get dizziness on this medication we may want to try Avodart although it'll take longer to take effect. I will also schedule the patient to see a nutritionist and get an eye exam regarding his diabetes.

## 2014-10-21 ENCOUNTER — Telehealth: Payer: Self-pay | Admitting: Family Medicine

## 2014-10-21 ENCOUNTER — Encounter (HOSPITAL_COMMUNITY)
Admission: RE | Admit: 2014-10-21 | Discharge: 2014-10-21 | Disposition: A | Discharge status: Home / Self Care | Payer: Medicare Other | Source: Ambulatory Visit | Attending: Nephrology | Admitting: Nephrology

## 2014-10-21 DIAGNOSIS — D509 Iron deficiency anemia, unspecified: Secondary | ICD-10-CM | POA: Insufficient documentation

## 2014-10-21 MED ORDER — SODIUM CHLORIDE 0.9 % IV SOLN
510.0000 mg | INTRAVENOUS | Status: AC
  Administered 2014-10-21: 510 mg via INTRAVENOUS
  Filled 2014-10-21: qty 17

## 2014-10-21 NOTE — Telephone Encounter (Signed)
PA submitted through [email or web address]  

## 2014-10-29 MED ORDER — TAMSULOSIN HCL 0.4 MG PO CAPS: 0.4000 mg | ORAL_CAPSULE | Freq: Every day | ORAL | Status: DC

## 2014-10-29 NOTE — Telephone Encounter (Signed)
Insurance denied Rapaflo and per Dr. Tanya NonesPickard must go back on Flomax as it is the best alternative comparatively to what his insurance will cover which is Doxazosin, Prazosin, Terazosin. Med sent to pharm and pt aware.

## 2014-11-04 ENCOUNTER — Ambulatory Visit (INDEPENDENT_AMBULATORY_CARE_PROVIDER_SITE_OTHER): Payer: Medicare Other | Admitting: Cardiology

## 2014-11-04 ENCOUNTER — Encounter: Payer: Self-pay | Admitting: Cardiology

## 2014-11-04 VITALS — BP 120/74 | HR 61 | Ht 66.0 in | Wt 142.0 lb

## 2014-11-04 DIAGNOSIS — I5032 Chronic diastolic (congestive) heart failure: Secondary | ICD-10-CM

## 2014-11-04 DIAGNOSIS — I10 Essential (primary) hypertension: Secondary | ICD-10-CM

## 2014-11-04 DIAGNOSIS — I480 Paroxysmal atrial fibrillation: Secondary | ICD-10-CM

## 2014-11-04 NOTE — Patient Instructions (Signed)
Your physician recommends that you continue on your current medications as directed. Please refer to the Current Medication list given to you today.  Your physician recommends that you schedule a follow-up appointment in: 2 MONTH OV/EKG

## 2014-11-04 NOTE — Progress Notes (Signed)
Cardiology Office Note   Date:  11/04/2014   ID:  Mark DunHorace Fadi Menter [name], DOB 1946-02-23, MRN [id number]  PCP:  Leo GrosserPICKARD,WARREN TOM, MD  Cardiologist:   Cassell Clementhomas Ivery Michalski, MD   No chief complaint on file.     History of Present Illness: [name] is a 69 y.o. male who presents for follow-up office visit [name] is a 69 y.o. male with the history of HTN, HL, CKD, AAA s/p repair in 09/2012, Myoview in 09/2012 normal.  Admitted in 07/2013 for AECOPD c/b AFib with RVR. He was placed on Coumadin and rate control Rx. Plan was to proceed with DCCV should he remain in atrial fibrillation. Echo demonstrated normal LVF.  Readmitted 08/2013 with a/c diastolic CHF c/b AKI on CKD. Coumadin was switched to Xarelto. He was also placed on amiodarone. He was discharged with plans to proceed with DCCV in approximately 3 weeks. He was discharged off of Lasix due to worsening renal function.  Readmitted 09/2013 with a/c diastolic CHF and persistent atrial fibrillation. He again had worsening renal function with diuresis. Lasix had to be held. He underwent TEE guided DCCV 09/22/2013. However, he had recurrent atrial fibrillation with RVR. Amiodarone dose was adjusted. He underwent repeat cardioversion by Dr. Ladona Ridgelaylor on 09/28/13. He has remained in normal sinus rhythm since his second cardioversion. He was again not sent home on Lasix due to worsening renal function.  After discharge, his Lasix was resumed due to worsening edema. At the present time he is on Lasix 40 mg daily and appears to be tolerating it well. He has not had any significant peripheral edema. He does have home oxygen which he uses on an as needed basis. He has not been experiencing any palpitations or tachycardia. He's had no dizziness or syncope. He had no chest pain.  He has chronic renal disease and is followed by his nephrologist Dr. Allena KatzPatel Since I last saw him he had episodes of recurrent epistaxis.  In consultation  with Dr. Tanya NonesPickard, we decided to stop his Xarelto and just use aspirin 325 mg daily.  He has had no further epistaxis.  His extensive bruising on his arms and legs is resolving. Past Medical History  Diagnosis Date  . GERD (gastroesophageal reflux disease)   . Hyperlipidemia   . H/O hiatal hernia   . Hypertension     Sees Dr. Lynnea FerrierWarren Pickard  . AAA (abdominal aortic aneurysm)     a. 09/2013: Resection and grafting of abdominal aortic aneurysm with insertion of an aorto by common iliac graft using 18 x 9 mm Hemashield Dacron graft.  Marland Kitchen. COPD (chronic obstructive pulmonary disease)   . Chronic diastolic CHF (congestive heart failure)     a.  Echo (08/13/2013): EF 55-60%, normal wall motion, mild MR, mild LAE  . Pneumonia   . Arthritis     "little in my hands" (09/08/2013)  . Chronic kidney disease (CKD), stage III (moderate)   . PAF (paroxysmal atrial fibrillation)     a. amiodarone Rx started 08/2013;  b. s/p TEE-DCCV 09/2013 => recurrent AFib => repeat DCCV => NSR;   c. Xarelto 15 QD due to CKD  . Hx of cardiovascular stress test     a. Myoview 09/2012:  no scar or ischemia  . Diabetes mellitus without complication     TYPE 2    Past Surgical History  Procedure Laterality Date  . Knee arthroscopy Right   . Abdominal aortic aneurysm repair  10/15/2012  Procedure: ANEURYSM ABDOMINAL AORTIC REPAIR;  Surgeon: Pryor Ochoa, MD;  Location: [facility] OR;  Service: Vascular;  Laterality: N/A;  Resection and Grafting of Abdominal Aortic Aneurysm - Aortobi-Iliac   . Tee without cardioversion N/A 09/22/2013    Procedure: TRANSESOPHAGEAL ECHOCARDIOGRAM (TEE);  Surgeon: Lars Masson, MD;  Location: [facility] ENDOSCOPY;  Service: Cardiovascular;  Laterality: N/A;  . Cardioversion N/A 09/22/2013    Procedure: CARDIOVERSION;  Surgeon: Lars Masson, MD;  Location[facility] ENDOSCOPY;  Service: Cardiovascular;  Laterality: N/A;  . Abdominal aortic aneurysm repair  10-15-2012  . Cardioversion  Jan. 12, 2015    Dr.  Doylene Canning. Ladona Ridgel  . Cardioversion N/A 09/28/2013    Procedure: CARDIOVERSION;  Surgeon: Marinus Maw, MD;  Location: [facility] CATH LAB;  Service: Cardiovascular;  Laterality: N/A;     Current Outpatient Prescriptions  Medication Sig Dispense Refill  . albuterol (VENTOLIN HFA) 108 (90 BASE) MCG/ACT inhaler Inhale 2 puffs into the lungs every 6 (six) hours as needed for wheezing or shortness of breath. 1 Inhaler 6  . amiodarone (PACERONE) 200 MG tablet Take 1 tablet (200 mg total) by mouth daily. 30 tablet 11  . aspirin 325 MG tablet Take 325 mg by mouth daily.    Marland Kitchen atorvastatin (LIPITOR) 10 MG tablet Take 1 tablet (10 mg total) by mouth daily. 90 tablet 3  . budesonide-formoterol (SYMBICORT) 160-4.5 MCG/ACT inhaler Inhale 2 puffs into the lungs 2 (two) times daily. 1 Inhaler 11  . diltiazem (TAZTIA XT) 360 MG 24 hr capsule Take 360 mg by mouth daily.    Marland Kitchen esomeprazole (NEXIUM) 40 MG capsule Take 40 mg by mouth daily at 12 noon.    . furosemide (LASIX) 40 MG tablet Take 1 tablet (40 mg total) by mouth daily. 60 tablet 5  . linagliptin (TRADJENTA) 5 MG TABS tablet Take 1 tablet (5 mg total) by mouth daily. 30 tablet 11  . metoprolol succinate (TOPROL-XL) 25 MG 24 hr tablet Take 75 mg by mouth daily.    . tamsulosin (FLOMAX) 0.4 MG CAPS capsule Take 1 capsule (0.4 mg total) by mouth daily. 30 capsule 3  . tiotropium (SPIRIVA HANDIHALER) 18 MCG inhalation capsule Place 1 capsule (18 mcg total) into inhaler and inhale daily. 30 capsule 11  . UNABLE TO FIND 1 each by Other route as needed. Dispense One Nurse, learning disability. ICD I50.33, J96.20,M19.90 1 each 0   No current facility-administered medications for this visit.    Allergies:   Niaspan    Social History:  The patient  reports that he quit smoking about 15 months ago. His smoking use included Cigarettes. He has a 56 pack-year smoking history. He has quit using smokeless tobacco. His smokeless tobacco use included Chew. He reports that he  does not drink alcohol or use illicit drugs.   Family History:  The patient's family history includes Diabetes in his mother; Heart attack in his father; Heart disease in his father and son; Tuberculosis in his mother.    ROS:  Please see the history of present illness.   Otherwise, review of systems are positive for none.   All other systems are reviewed and negative.    PHYSICAL EXAM: VS:  BP 120/74 mmHg  Pulse 61  Ht  (1.676 m)  Wt 142 lb (64.411 kg)  BMI 22.93 kg/m2 , BMI Body mass index is 22.93 kg/(m^2). GEN: Well nourished, well developed, in no acute distress HEENT: normal Neck: no JVD, carotid bruits, or masses  Cardiac: RRR; no murmurs, rubs, or gallops,no edema  Respiratory:  clear to auscultation bilaterally, normal work of breathing GI: soft, nontender, nondistended, + BS MS: no deformity or atrophy Skin: warm and dry, no rash Neuro:  Strength and sensation are intact Psych: euthymic mood, full affect   EKG:  EKG is not ordered today.    Recent Labs: 08/27/2014: TSH 1.790 08/31/2014: Pro B Natriuretic peptide (BNP) 4388.0* 09/20/2014: ALT 23 10/07/2014: BUN 36*; Creatinine 3.42*; Hemoglobin 10.3*; Platelets 224; Potassium 4.3; Sodium 132*    Lipid Panel    Component Value Date/Time   CHOL 119 07/20/2014 1143   TRIG 80 07/20/2014 1143   HDL 54 07/20/2014 1143   CHOLHDL 2.2 07/20/2014 1143   VLDL 16 07/20/2014 1143   LDLCALC 49 07/20/2014 1143      Wt Readings from Last 3 Encounters:  11/04/14 142 lb (64.411 kg)  10/18/14 145 lb (65.772 kg)  10/14/14 140 lb (63.504 kg)         ASSESSMENT AND PLAN:  1. paroxysmal atrial fibrillation maintaining normal sinus rhythm on amiodarone. He has a chadssvasc score of 5(age, hypertension, vascular disease, diabetes, congestive heart failure).  He is no longer on Xarelto because of excessive bruising and recurrent epistaxis 2. chronic diastolic heart failure currently compensated on Lasix 40 mg daily 3.  Hypercholesterolemia 4. diabetes mellitus 5. chronic kidney disease 6. COPD   Current medicines are reviewed at length with the patient today.  The patient does not have concerns regarding medicines.  The following changes have been made:  no change  Labs/ tests ordered today include: None  No orders of the defined types were placed in this encounter.     Disposition:   FU with Dr. Patty Sermons in 2 months or office visit and EKG.  Continue amiodarone.  He is maintaining normal sinus rhythm.  He is no longer smoking.  He quit smoking about a year ago.   Signed, Cassell Clement, MD  11/04/2014 1:58 PM    [facility] [address] [place], [place], [place]  [address] Phone: [phone]; Fax: [phone]

## 2014-11-05 ENCOUNTER — Encounter: Payer: Self-pay | Admitting: Family Medicine

## 2014-11-05 ENCOUNTER — Ambulatory Visit (INDEPENDENT_AMBULATORY_CARE_PROVIDER_SITE_OTHER): Payer: Medicare Other | Admitting: Family Medicine

## 2014-11-05 VITALS — BP 120/64 | HR 64 | Temp 97.6°F | Resp 18 | Wt 141.0 lb

## 2014-11-05 DIAGNOSIS — K4090 Unilateral inguinal hernia, without obstruction or gangrene, not specified as recurrent: Secondary | ICD-10-CM

## 2014-11-05 NOTE — Progress Notes (Signed)
Subjective:    Patient ID: Mark [name], male    DOB: [birth year], 69 y.o.   MRN: [id number]  HPI Patient has a complicated PMH.  He is scheduled to see his nephrologist Dr. Allena KatzPatel Monday to recheck his renal fcn.  Early this week, he noticed a bulge in his right inguinal canal.  He has an inguinal hernia on exam.  It is slightly tender.  He denies any change in his bowel movements. He denies any blood in his stool. The hernia is easily reducible. He has normal bowel sounds. Past Medical History  Diagnosis Date  . GERD (gastroesophageal reflux disease)   . Hyperlipidemia   . H/O hiatal hernia   . Hypertension     Sees Dr. Lynnea FerrierWarren Pickard  . AAA (abdominal aortic aneurysm)     a. 09/2013: Resection and grafting of abdominal aortic aneurysm with insertion of an aorto by common iliac graft using 18 x 9 mm Hemashield Dacron graft.  Marland Kitchen. COPD (chronic obstructive pulmonary disease)   . Chronic diastolic CHF (congestive heart failure)     a.  Echo (08/13/2013): EF 55-60%, normal wall motion, mild MR, mild LAE  . Pneumonia   . Arthritis     "little in my hands" (09/08/2013)  . Chronic kidney disease (CKD), stage III (moderate)   . PAF (paroxysmal atrial fibrillation)     a. amiodarone Rx started 08/2013;  b. s/p TEE-DCCV 09/2013 => recurrent AFib => repeat DCCV => NSR;   c. Xarelto 15 QD due to CKD  . Hx of cardiovascular stress test     a. Myoview 09/2012:  no scar or ischemia  . 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 Knee arthroscopy Right   . Abdominal aortic aneurysm repair  10/15/2012    Procedure: ANEURYSM ABDOMINAL AORTIC REPAIR;  Surgeon: Pryor OchoaJames D Lawson, MD;  [facility];  Service: Vascular;  Laterality: N/A;  Resection and Grafting of Abdominal Aortic Aneurysm - Aortobi-Iliac   . Tee without cardioversion N/A 09/22/2013    Procedure: TRANSESOPHAGEAL ECHOCARDIOGRAM (TEE);  Surgeon: Lars MassonKatarina H Nelson, MD;   [facility];  Service: Cardiovascular;  Laterality: N/A;  . Cardioversion N/A 09/22/2013    Procedure: CARDIOVERSION;  Surgeon: Lars MassonKatarina H Nelson, MD;  [facility] END[facility]COPY;  Service: Cardiovascular;  Laterality: N/A;  . Abdominal aortic aneurysm repair  10-15-2012  . Cardioversion  Jan. 12, 2015    Dr. Doylene CanningGregg W. Ladona Ridgelaylor  . Cardioversion N/A 09/28/2013    Procedure: CARDIOVERSION;  Surgeon: Marinus MawGregg W Taylor, MD;  [facility] C[facility] LAB;  Service: Cardiovascular;  Laterality: N/A;   Current Outpatient Prescriptions on File Prior to Visit  Medication Sig Dispense Refill  . albuterol (VENTOLIN HFA) 108 (90 BASE) MCG/ACT inhaler Inhale 2 puffs into the lungs every 6 (six) hours as needed for wheezing or shortness of breath. 1 Inhaler 6  . amiodarone (PACERONE) 200 MG tablet Take 1 tablet (200 mg total) by mouth daily. 30 tablet 11  . aspirin 325 MG tablet Take 325 mg by mouth daily.    Marland Kitchen. atorvastatin (LIPITOR) 10 MG tablet Take 1 tablet (10 mg total) by mouth daily. 90 tablet 3  . budesonide-formoterol (SYMBICORT) 160-4.5 MCG/ACT inhaler Inhale 2 puffs into the lungs 2 (two) times daily. 1 Inhaler 11  . diltiazem (TAZTIA XT) 360 MG 24 hr capsule Take 360 mg by mouth daily.    Marland Kitchen. esomeprazole (NEXIUM) 40 MG  capsule Take 40 mg by mouth daily at 12 noon.    . furosemide (LASIX) 40 MG tablet Take 1 tablet (40 mg total) by mouth daily. 60 tablet 5  . linagliptin (TRADJENTA) 5 MG TABS tablet Take 1 tablet (5 mg total) by mouth daily. 30 tablet 11  . metoprolol succinate (TOPROL-XL) 25 MG 24 hr tablet Take 75 mg by mouth daily.    . tamsulosin (FLOMAX) 0.4 MG CAPS capsule Take 1 capsule (0.4 mg total) by mouth daily. 30 capsule 3  . tiotropium (SPIRIVA HANDIHALER) 18 MCG inhalation capsule Place 1 capsule (18 mcg total) into inhaler and inhale daily. 30 capsule 11  . UNABLE TO FIND 1 each by Other route as needed. Dispense One Nurse, learning disability. ICD I50.33, J96.20,M19.90 1 each 0   No  current facility-administered medications on file prior to visit.   Allergies  Allergen Reactions  . Niaspan [Niacin Er] Anaphylaxis   History   Social History  . Marital Status: Married    Spouse Name: Nicole Cella  . Number of Children: 4  . Years of Education: N/A   Occupational History  . Works PT as a Education administrator    Social History Main Topics  . Smoking status: Former Smoker -- 1.00 packs/day for 56 years    Types: Cigarettes    Quit date: 07/31/2013  . Smokeless tobacco: Former Neurosurgeon    Types: Chew     Comment: 09/08/2013 "stopped smoking cigarettes ~ 2 months ago; aien't chew in probably 10 yrs"  . Alcohol Use: No  . Drug Use: No  . Sexual Activity: Not Currently   Other Topics Concern  . Not on file   Social History Narrative   Married.  Lives with wife.  Ambulates without assistance.      Review of Systems  All other systems reviewed and are negative.      Objective:   Physical Exam  Cardiovascular: Normal rate, regular rhythm and normal heart sounds.   Pulmonary/Chest: Effort normal and breath sounds normal.  Abdominal: Soft. Bowel sounds are normal. A hernia is present. Hernia confirmed positive in the right inguinal area.  Vitals reviewed.         Assessment & Plan:  Right inguinal hernia   patient has a basic right inguinal hernia with no evidence of incarceration. I will be glad to schedule the patient to me with the surgeon to discuss surgical repair however I was very honest with the patient given his history of atrial fibrillation, his COPD, his recent admission with heart failure, and his worsening renal function, I believe any elective surgery is very high risk. I would actually recommend against elective surgery unless the patient insists. Patient will discuss his renal function with his nephrologist. If his renal function is improving, his pulmonary status is stable at this time. Now would be the time to proceed with an elective surgery if he so  desired but again I was very clear with the patient it would be a high risk surgery given his medical problems.  Therefore I recommended against it.  Patient will think about it and let me know his decision.

## 2014-11-11 ENCOUNTER — Encounter: Payer: Self-pay | Admitting: *Deleted

## 2014-11-11 ENCOUNTER — Encounter: Payer: Medicare Other | Attending: Family Medicine | Admitting: *Deleted

## 2014-11-11 VITALS — Ht 65.0 in | Wt 138.9 lb

## 2014-11-11 DIAGNOSIS — Z713 Dietary counseling and surveillance: Secondary | ICD-10-CM | POA: Insufficient documentation

## 2014-11-11 DIAGNOSIS — E119 Type 2 diabetes mellitus without complications: Secondary | ICD-10-CM | POA: Insufficient documentation

## 2014-11-11 NOTE — Patient Instructions (Signed)
Follow the Plate method Change from whole milk to 2% milk Good to Eat 1-2 pieces of fruit per day - always have some kind of protein with it..... Eggs, nuts, peanut butter Try avoid chicken skin

## 2014-11-12 ENCOUNTER — Telehealth: Payer: Self-pay | Admitting: Family Medicine

## 2014-11-12 DIAGNOSIS — K4090 Unilateral inguinal hernia, without obstruction or gangrene, not specified as recurrent: Secondary | ICD-10-CM

## 2014-11-12 NOTE — Telephone Encounter (Signed)
Patient would like to speak with you  [phone]

## 2014-11-12 NOTE — Telephone Encounter (Signed)
Pt would like to get a referral to have hernia fixed - ok to do?

## 2014-11-13 ENCOUNTER — Other Ambulatory Visit: Payer: Self-pay | Admitting: Family Medicine

## 2014-11-15 NOTE — Telephone Encounter (Signed)
Ok with referral to gen surgery.

## 2014-11-15 NOTE — Telephone Encounter (Signed)
Referral ordered

## 2014-11-16 NOTE — Progress Notes (Signed)
Diabetes Self-Management Education   Appt. Start Time: 1030 Appt. End Time: 1200  11/16/2014  Mr. Mark [name], identified by name and date of birth, is a 69 y.o. male with a diagnosis of Diabetes: Type 2.  Other people present during visit:  Patient . Mark [name]'s wife has recently had brain surgery. They used to eat out a great deal. They are now eating at home. I feel that carb counting and reading labels was beyond Mark [name]'s ability at this time. My recommendation was to follow the "Plate Method".  ASSESSMENT  Height 5\' 5"  (1.651 m), weight 138 lb 14.4 oz (63.005 kg). Body mass index is 23.11 kg/(m^2).  Initial Visit Information:  Are you currently following a meal plan?: Yes What type of meal plan do you follow?: Heart Healthy Are you taking your medications as prescribed?: Yes (uses pill organizer) Are you checking your feet?: No How often do you need to have someone help you when you read instructions, pamphlets, or other written materials from your doctor or pharmacy?: 1 - Never   Psychosocial:   Patient Belief/Attitude about Diabetes: Motivated to manage diabetes Self-care barriers: None Self-management support: Doctor's office, Family, CDE visits Other persons present: Patient Patient Concerns: Nutrition/Meal planning Special Needs: None Preferred Learning Style: No preference indicated Learning Readiness: Change in progress  Complications:   Last HgB A1C per patient/outside source: 6.6 mg/dL How often do you check your blood sugar?: Not recommended by provider Have you had a dilated eye exam in the past 12 months?: No Have you had a dental exam in the past 12 months?: No (false teeth)  Diet Intake:  Breakfast:  8:00am: cereal, frosted flakes w/ banana, 3C coffee black Lunch: vegetables plate Dinner: 8:29FA5:00pm  [place] fried chicken, creamed potatoes, cole slaw / spaghetti 1C, meat sauce, salad ./ [place] Barbeque Ribs, 1/2 loaded baked potatoe, salad Snack  (evening): oatmeal cookies, gingersnap Beverage(s): coffee, water, sweet tea, milk  Exercise:  Exercise: ADL's  Individualized Plan for Diabetes Self-Management Training:   Learning Objective:  Patient will have a greater understanding of diabetes self-management. Patient education plan per assessed needs and concerns is to attend individual sessions     Education Topics Reviewed with Patient Today:  Definition of diabetes, type 1 and 2, and the diagnosis of diabetes, Factors that contribute to the development of diabetes, Explored patient's options for treatment of their diabetes Role of diet in the treatment of diabetes and the relationship between the three main macronutrients and blood glucose level, Information on hints to eating out and maintain blood glucose control., Meal options for control of blood glucose level and chronic complications. Role of exercise on diabetes management, blood pressure control and cardiac health. Relationship between chronic complications and blood glucose control, Assessed and discussed foot care and prevention of foot problems, Dental care, Identified and discussed with patient  current chronic complications  PATIENTS GOALS/Plan (Developed by the patient):  Nutrition: General guidelines for healthy choices and portions discussed Physical Activity: Exercise 3-5 times per week, 30 minutes per day Reducing Risk: do foot checks daily  Patient Instructions  Follow the Plate method Change from whole milk to 2% milk Good to Eat 1-2 pieces of fruit per day - always have some kind of protein with it..... Eggs, nuts, peanut butter Try avoid chicken skin  Expected Outcomes:  Demonstrated interest in learning. Expect positive outcomes  Education material provided: Living Well with Diabetes, A1C conversion sheet, Meal plan card, My Plate and Snack sheet  If problems or questions, patient to contact team via:  Phone  Future DSME appointment: PRN

## 2014-11-17 LAB — HM DIABETES EYE EXAM

## 2014-11-22 ENCOUNTER — Other Ambulatory Visit: Payer: Self-pay | Admitting: Family Medicine

## 2014-11-22 NOTE — Telephone Encounter (Signed)
Medication refilled per protocol. 

## 2014-11-27 ENCOUNTER — Other Ambulatory Visit: Payer: Self-pay | Admitting: Family Medicine

## 2014-11-29 ENCOUNTER — Ambulatory Visit (INDEPENDENT_AMBULATORY_CARE_PROVIDER_SITE_OTHER): Payer: Self-pay | Admitting: General Surgery

## 2014-11-29 NOTE — H&P (Signed)
History of Present Illness Axel Filler(Audon Heymann MD; 11/29/2014 1:47 PM) Patient words: hernia.  The patient is a 69 year old male who presents with an inguinal hernia. Male who is referred by Dr. Tanya NonesPickard for evaluation of a right inguinal hernia. The patient states that it's been there for several months. He states he has pain to the right area when he walks. He is able to reduce it. He has had no signs or symptoms of incarceration or strangulation. The patient does have a history of a previous open AAA repair. Patient also sees Dr. Patty SermonsBrackbill. The patient has had previous cardioversion for paroxysmal atrial fibrillation. The patient is currently off Xarelto and is only taking aspirin.   Other Problems [name]([name], CMA; 11/29/2014 1:30 PM) Chronic Obstructive Lung Disease Congestive Heart Failure Diabetes Mellitus Emphysema Of Lung Gastroesophageal Reflux Disease High blood pressure Hypercholesterolemia  Past Surgical History [name]([name], CMA; 11/29/2014 1:30 PM) Knee Surgery Left.  Diagnostic Studies History [name]([name]; 11/29/2014 1:30 PM) Colonoscopy 5-10 years ago  Allergies [name]([name], CMA; 11/29/2014 1:31 PM) Niaspan *ANTIHYPERLIPIDEMICS*  Medication History [name]([name], CMA; 11/29/2014 1:31 PM) TraMADol HCl (50MG  Tablet, Oral) Active. Amiodarone HCl (200MG  Tablet, Oral) Active. Atorvastatin Calcium (10MG  Tablet, Oral) Active. Cyclobenzaprine HCl (10MG  Tablet, Oral) Active. Furosemide (40MG  Tablet, Oral) Active. Levofloxacin (750MG  Tablet, Oral) Active. Losartan Potassium (50MG  Tablet, Oral) Active. Metolazone (2.5MG  Tablet, Oral) Active. Metoprolol Succinate ER (25MG  Tablet ER 24HR, Oral) Active. PredniSONE (10MG  Tablet, Oral) Active. Tamsulosin HCl (0.4MG  Capsule, Oral) Active. Taztia XT (360MG  Capsule ER 24HR, Oral) Active. Xarelto (15MG  Tablet, Oral) Active. Medications Reconciled  Social History [name]([name][place]; 11/29/2014 1:30  PM) Alcohol use Occasional alcohol use. Caffeine use Coffee, Tea. Illicit drug use Uses yearly. Tobacco use Former smoker.  Family History [name]([name], [place]; 11/29/2014 1:30 PM) Alcohol Abuse Father. Arthritis Family Members In General, Father. Breast Cancer Family Members In General, Father. Melanoma Family Members In General.  Review of Systems [name]([name] CMA; 11/29/2014 1:30 PM) General Present- Appetite Loss and Weight Loss. Not Present- Chills, Fatigue, Fever, Night Sweats and Weight Gain. Skin Present- Dryness. Not Present- Change in Wart/Mole, Hives, Jaundice, New Lesions, Non-Healing Wounds, Rash and Ulcer. HEENT Present- Hearing Loss. Not Present- Earache, Hoarseness, Nose Bleed, Oral Ulcers, Ringing in the Ears, Seasonal Allergies, Sinus Pain, Sore Throat, Visual Disturbances, Wears glasses/contact lenses and Yellow Eyes. Cardiovascular Present- Shortness of Breath. Not Present- Chest Pain, Difficulty Breathing Lying Down, Leg Cramps, Palpitations, Rapid Heart Rate and Swelling of Extremities. Gastrointestinal Present- Abdominal Pain, Constipation, Excessive gas and Vomiting. Not Present- Bloating, Bloody Stool, Change in Bowel Habits, Chronic diarrhea, Difficulty Swallowing, Gets full quickly at meals, Hemorrhoids, Indigestion, Nausea and Rectal Pain. Musculoskeletal Not Present- Back Pain, Joint Pain, Joint Stiffness, Muscle Pain, Muscle Weakness and Swelling of Extremities. Neurological Not Present- Decreased Memory, Fainting, Headaches, Numbness, Seizures, Tingling, Tremor, Trouble walking and Weakness. Psychiatric Not Present- Anxiety, Bipolar, Change in Sleep Pattern, Depression, Fearful and Frequent crying. Endocrine Not Present- Cold Intolerance, Excessive Hunger, Hair Changes, Heat Intolerance, Hot flashes and New Diabetes. Hematology Not Present- Easy Bruising, Excessive bleeding, Gland problems, HIV and Persistent Infections.   Vitals [name]([name] CMA; 11/29/2014  1:32 PM) 11/29/2014 1:31 PM Weight: 137 lb Height: 66in Body Surface Area: 1.7 m Body Mass Index: 22.11 kg/m Temp.: 97.43F(Temporal)  Pulse: 61 (Regular)  Resp.: 16 (Unlabored)  BP: 132/62 (Sitting, Left Arm, Standard)    Physical Exam Axel Filler(Mackenzi Krogh MD; 11/29/2014 1:45 PM) General Mental Status-Alert. General Appearance-Consistent with stated  age. Hydration-Well hydrated. Voice-Normal.  Head and Neck Head-normocephalic, atraumatic with no lesions or palpable masses. Trachea-midline. Thyroid Gland Characteristics - normal size and consistency.  Chest and Lung Exam Chest and lung exam reveals -quiet, even and easy respiratory effort with no use of accessory muscles and on auscultation, normal breath sounds, no adventitious sounds and normal vocal resonance. Inspection Chest Wall - Normal. Back - normal.  Cardiovascular Cardiovascular examination reveals -normal heart sounds, regular rate and rhythm with no murmurs and normal pedal pulses bilaterally.  Abdomen Inspection Skin - Scar - no surgical scars. Hernias - Inguinal hernia - Right - Reducible. Palpation/Percussion Normal exam - Soft, Non Tender, No Rebound tenderness, No Rigidity (guarding) and No hepatosplenomegaly. Auscultation Normal exam - Bowel sounds normal.    Assessment & Plan Axel Filler MD; 11/29/2014 1:48 PM) RIGHT INGUINAL HERNIA (550.90  K40.90) Impression: 69 year old male with a right inguinal hernia, likely indirect  1. The patient sees Dr. Patty Sermons and will require cardiac clearance prior to schedule surgery scheduling. 2. Once we have received cardiac clearance we'll proceed with open right inguinal hernia repair with mesh. 3. All risks and benefits were discussed with the patient to generally include, but not limited to: infection, bleeding, damage to surrounding structures, acute and chronic nerve pain, and recurrence. Alternatives were offered and described.  All questions were answered and the patient voiced understanding of the procedure and wishes to proceed at this point with hernia repair. Current Plans

## 2014-12-09 ENCOUNTER — Encounter: Payer: Self-pay | Admitting: Family Medicine

## 2014-12-09 ENCOUNTER — Ambulatory Visit (INDEPENDENT_AMBULATORY_CARE_PROVIDER_SITE_OTHER): Payer: Medicare Other | Admitting: Family Medicine

## 2014-12-09 VITALS — BP 126/72 | HR 88 | Temp 98.2°F | Resp 18 | Ht 66.0 in | Wt 138.0 lb

## 2014-12-09 DIAGNOSIS — J441 Chronic obstructive pulmonary disease with (acute) exacerbation: Secondary | ICD-10-CM | POA: Diagnosis not present

## 2014-12-09 MED ORDER — AZITHROMYCIN 250 MG PO TABS: ORAL_TABLET | ORAL | Status: DC

## 2014-12-09 MED ORDER — PREDNISONE 20 MG PO TABS: 40.0000 mg | ORAL_TABLET | Freq: Every day | ORAL | Status: DC

## 2014-12-09 NOTE — Progress Notes (Signed)
Subjective:    Patient ID: Mark [name], male    DOB: [birth year], 69 y.o.   MRN: [id number]  HPI  Patient has COPD. He is currently on Symbicort as well as Spiriva. Over the last week he has developed a cough productive of thick yellow mucus. He reports chest congestion. He reports shortness of breath. His pulse oximetry earlier in the week was in the 70s. He states that he is slightly better today is his pulse oximetry has come up to 88% on room air. He denies chest pain. He does report shortness of breath. He refuses to go the hospital. He also refuses oxygen. Patient states that his symptoms are previous to similar COPD exacerbations. Today on examination there is no evidence of peripheral edema so she is has had in the past with congestive heart failure. On lung exam today he has markedly diminished breath sounds with occasional expiratory wheezes but no crackles Rales. He has no JVD. His weight is stable from his last office visit. There is no evidence of fluid overload on exam. Past Medical History  Diagnosis Date  . GERD (gastroesophageal reflux disease)   . Hyperlipidemia   . H/O hiatal hernia   . Hypertension     Sees Dr. Lynnea FerrierWarren Aalani Aikens  . AAA (abdominal aortic aneurysm)     a. 09/2013: Resection and grafting of abdominal aortic aneurysm with insertion of an aorto by common iliac graft using 18 x 9 mm Hemashield Dacron graft.  Marland Kitchen. COPD (chronic obstructive pulmonary disease)   . Chronic diastolic CHF (congestive heart failure)     a.  Echo (08/13/2013): EF 55-60%, normal wall motion, mild MR, mild LAE  . Pneumonia   . Arthritis     "little in my hands" (09/08/2013)  . Chronic kidney disease (CKD), stage III (moderate)   . PAF (paroxysmal atrial fibrillation)     a. amiodarone Rx started 08/2013;  b. s/p TEE-DCCV 09/2013 => recurrent AFib => repeat DCCV => NSR;   c. Xarelto 15 QD due to CKD  . Hx of cardiovascular stress test     a. Myoview 09/2012:  no scar or ischemia  .  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 Knee arthroscopy Right   . Abdominal aortic aneurysm repair  10/15/2012    Procedure: ANEURYSM ABDOMINAL AORTIC REPAIR;  Surgeon: Pryor OchoaJames D Lawson, MD;  [facility];  Service: Vascular;  Laterality: N/A;  Resection and Grafting of Abdominal Aortic Aneurysm - Aortobi-Iliac   . Tee without cardioversion N/A 09/22/2013    Procedure: TRANSESOPHAGEAL ECHOCARDIOGRAM (TEE);  Surgeon: Lars MassonKatarina H Nelson, MD;  [facility];  Service: Cardiovascular;  Laterality: N/A;  . Cardioversion N/A 09/22/2013    Procedure: CARDIOVERSION;  Surgeon: Lars MassonKatarina H Nelson, MD;  [facility];  Service: Cardiovascular;  Laterality: N/A;  . Abdominal aortic aneurysm repair  10-15-2012  . Cardioversion  Jan. 12, 2015    Dr. Doylene CanningGregg W. Ladona Ridgelaylor  . Cardioversion N/A 09/28/2013    Procedure: CARDIOVERSION;  Surgeon: Marinus MawGregg W Taylor, MD;  [facility];  Service: Cardiovascular;  Laterality: N/A;   Current Outpatient Prescriptions on File Prior to Visit  Medication Sig Dispense Refill  . albuterol (VENTOLIN HFA) 108 (90 BASE) MCG/ACT inhaler Inhale 2 puffs into the lungs every 6 (six) hours as needed for wheezing or shortness of breath. 1 Inhaler 6  . amiodarone (PACERONE) 200 MG tablet TAKE 1  TABLET (200 MG TOTAL) BY MOUTH DAILY. 30 tablet 5  . aspirin 325 MG tablet Take 325 mg by mouth daily.    Marland Kitchen atorvastatin (LIPITOR) 10 MG tablet Take 1 tablet (10 mg total) by mouth daily. 90 tablet 3  . budesonide-formoterol (SYMBICORT) 160-4.5 MCG/ACT inhaler Inhale 2 puffs into the lungs 2 (two) times daily. 1 Inhaler 11  . diltiazem (TAZTIA XT) 360 MG 24 hr capsule Take 360 mg by mouth daily.    Marland Kitchen esomeprazole (NEXIUM) 40 MG capsule Take 40 mg by mouth daily at 12 noon.    . furosemide (LASIX) 40 MG tablet Take 1 tablet (40 mg total) by mouth daily. 60 tablet 5  . linagliptin (TRADJENTA) 5 MG TABS tablet Take 1  tablet (5 mg total) by mouth daily. 30 tablet 11  . losartan (COZAAR) 50 MG tablet TAKE 1 TABLET EVERY DAY 30 tablet 11  . metoprolol succinate (TOPROL-XL) 25 MG 24 hr tablet TAKE 3 TABLETS BY MOUTH EVERY DAY TAKE WITH OR IMMEDIATELY AFTER BREAKFAST 90 tablet 11  . tamsulosin (FLOMAX) 0.4 MG CAPS capsule Take 1 capsule (0.4 mg total) by mouth daily. 30 capsule 3  . tiotropium (SPIRIVA HANDIHALER) 18 MCG inhalation capsule Place 1 capsule (18 mcg total) into inhaler and inhale daily. 30 capsule 11  . UNABLE TO FIND 1 each by Other route as needed. Dispense One Nurse, learning disability. ICD I50.33, J96.20,M19.90 1 each 0   No current facility-administered medications on file prior to visit.   Allergies  Allergen Reactions  . Niaspan [Niacin Er] Anaphylaxis   History   Social History  . Marital Status: Married    Spouse Name: Mark [name]  . Number of Children: 4  . Years of Education: N/A   Occupational History  . Works PT as a Education administrator    Social History Main Topics  . Smoking status: Former Smoker -- 1.00 packs/day for 56 years    Types: Cigarettes    Quit date: 07/31/2013  . Smokeless tobacco: Former Neurosurgeon    Types: Chew     Comment: 09/08/2013 "stopped smoking cigarettes ~ 2 months ago; aien't chew in probably 10 yrs"  . Alcohol Use: No  . Drug Use: No  . Sexual Activity: Not Currently   Other Topics Concern  . Not on file   Social History Narrative   Married.  Lives with wife.  Ambulates without assistance.     Review of Systems  All other systems reviewed and are negative.      Objective:   Physical Exam  Constitutional: He appears well-developed and well-nourished. No distress.  Neck: Neck supple. No JVD present.  Cardiovascular: Normal rate, regular rhythm and normal heart sounds.   Pulmonary/Chest: No accessory muscle usage. No tachypnea. No respiratory distress. He has decreased breath sounds. He has wheezes.  Abdominal: Soft. Bowel sounds are normal.    Musculoskeletal: He exhibits no edema.  Lymphadenopathy:    He has no cervical adenopathy.  Skin: He is not diaphoretic.  Vitals reviewed.         Assessment & Plan:  COPD exacerbation - Plan: azithromycin (ZITHROMAX) 250 MG tablet, predniSONE (DELTASONE) 20 MG tablet, DISCONTINUED: predniSONE (DELTASONE) 20 MG tablet, DISCONTINUED: azithromycin (ZITHROMAX) 250 MG tablet  I believe the patient is having a COPD exacerbation. I will start the patient on prednisone 40 mg a day for 7 days. I'll also cover the patient with azithromycin 500 mg by mouth daily one, 250 mg by mouth daily as 2  through 5. I am aware of the mornings of QTC prolongation on amiodarone however the patient has tolerated this combination in the past and has worked well for him. He can use albuterol 2 puffs inhaled every 6 hours as needed. Continue his breathing Symbicort and recheck Monday or immediately if worse

## 2014-12-09 NOTE — Progress Notes (Signed)
Escribe error with Prednisone and Azithromycin.  Both called into pharmacy

## 2014-12-13 ENCOUNTER — Ambulatory Visit (INDEPENDENT_AMBULATORY_CARE_PROVIDER_SITE_OTHER): Payer: Medicare Other | Admitting: Family Medicine

## 2014-12-13 ENCOUNTER — Encounter: Payer: Self-pay | Admitting: Family Medicine

## 2014-12-13 VITALS — BP 140/70 | HR 68 | Temp 97.6°F | Resp 20 | Ht 66.5 in | Wt 141.0 lb

## 2014-12-13 DIAGNOSIS — J441 Chronic obstructive pulmonary disease with (acute) exacerbation: Secondary | ICD-10-CM

## 2014-12-13 NOTE — Progress Notes (Signed)
Subjective:    Patient ID: Mark [name], male    DOB: [birth year], 69 y.o.   MRN: [id number]  HPI  12/09/14 Patient has COPD. He is currently on Symbicort as well as Spiriva. Over the last week he has developed a cough productive of thick yellow mucus. He reports chest congestion. He reports shortness of breath. His pulse oximetry earlier in the week was in the 70s. He states that he is slightly better today is his pulse oximetry has come up to 88% on room air. He denies chest pain. He does report shortness of breath. He refuses to go the hospital. He also refuses oxygen. Patient states that his symptoms are previous to similar COPD exacerbations. Today on examination there is no evidence of peripheral edema so she is has had in the past with congestive heart failure. On lung exam today he has markedly diminished breath sounds with occasional expiratory wheezes but no crackles Rales. He has no JVD. His weight is stable from his last office visit. There is no evidence of fluid overload on exam.  At that time, my plan was: I believe the patient is having a COPD exacerbation. I will start the patient on prednisone 40 mg a day for 7 days. I'll also cover the patient with azithromycin 500 mg by mouth daily one, 250 mg by mouth daily as 2 through 5. I am aware of the mornings of QTC prolongation on amiodarone however the patient has tolerated this combination in the past and has worked well for him. He can use albuterol 2 puffs inhaled every 6 hours as needed. Continue his breathing Symbicort and recheck Monday or immediately if worse  12/13/14 Patient is doing much better. Today he is 90-91% on room air versus 86%. His breathing is better. He is using his Symbicort and Spiriva on a daily basis. He is taking the prednisone and antibiotics. He has not been using his albuterol at all. He continues to have a cough productive of yellow and thick sputum. He continues to have some mild shortness of breath. He  denies any chest pain or orthopnea. Past Medical History  Diagnosis Date  . GERD (gastroesophageal reflux disease)   . Hyperlipidemia   . H/O hiatal hernia   . Hypertension     Sees Dr. Lynnea Ferrier  . AAA (abdominal aortic aneurysm)     a. 09/2013: Resection and grafting of abdominal aortic aneurysm with insertion of an aorto by common iliac graft using 18 x 9 mm Hemashield Dacron graft.  Marland Kitchen COPD (chronic obstructive pulmonary disease)   . Chronic diastolic CHF (congestive heart failure)     a.  Echo (08/13/2013): EF 55-60%, normal wall motion, mild MR, mild LAE  . Pneumonia   . Arthritis     "little in my hands" (09/08/2013)  . Chronic kidney disease (CKD), stage III (moderate)   . PAF (paroxysmal atrial fibrillation)     a. amiodarone Rx started 08/2013;  b. s/p TEE-DCCV 09/2013 => recurrent AFib => repeat DCCV => NSR;   c. Xarelto 15 QD due to CKD  . Hx of cardiovascular stress test     a. Myoview 09/2012:  no scar or ischemia  . 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 Knee arthroscopy Right   . Abdominal aortic aneurysm repair  10/15/2012    Procedure: ANEURYSM ABDOMINAL AORTIC REPAIR;  Surgeon: Pryor Ochoa, MD;  [facility]  OR;  Service: Vascular;  Laterality: N/A;  Resection and Grafting of Abdominal Aortic Aneurysm - Aortobi-Iliac   . Tee without cardioversion N/A 09/22/2013    Procedure: TRANSESOPHAGEAL ECHOCARDIOGRAM (TEE);  Surgeon: Lars Masson, MD;  Location[facility] ENDOSCOPY;  Service: Cardiovascular;  Laterality: N/A;  . Cardioversion N/A 09/22/2013    Procedure: CARDIOVERSION;  Surgeon: Lars Masson, MD;  [facility];  Service: Cardiovascular;  Laterality: N/A;  . Abdominal aortic aneurysm repair  10-15-2012  . Cardioversion  Jan. 12, 2015    Dr. Doylene Canning. Ladona Ridgel  . Cardioversion N/A 09/28/2013    Procedure: CARDIOVERSION;  Surgeon: Marinus Maw, MD;  Location: [facility] CATH LAB;  Service:  Cardiovascular;  Laterality: N/A;   Current Outpatient Prescriptions on File Prior to Visit  Medication Sig Dispense Refill  . albuterol (VENTOLIN HFA) 108 (90 BASE) MCG/ACT inhaler Inhale 2 puffs into the lungs every 6 (six) hours as needed for wheezing or shortness of breath. 1 Inhaler 6  . amiodarone (PACERONE) 200 MG tablet TAKE 1 TABLET (200 MG TOTAL) BY MOUTH DAILY. 30 tablet 5  . aspirin 325 MG tablet Take 325 mg by mouth daily.    Marland Kitchen atorvastatin (LIPITOR) 10 MG tablet Take 1 tablet (10 mg total) by mouth daily. 90 tablet 3  . azithromycin (ZITHROMAX) 250 MG tablet 2 tabs poqday1, 1 tab poqday 2-5 6 tablet 0  . budesonide-formoterol (SYMBICORT) 160-4.5 MCG/ACT inhaler Inhale 2 puffs into the lungs 2 (two) times daily. 1 Inhaler 11  . diltiazem (TAZTIA XT) 360 MG 24 hr capsule Take 360 mg by mouth daily.    Marland Kitchen esomeprazole (NEXIUM) 40 MG capsule Take 40 mg by mouth daily at 12 noon.    . furosemide (LASIX) 40 MG tablet Take 1 tablet (40 mg total) by mouth daily. 60 tablet 5  . linagliptin (TRADJENTA) 5 MG TABS tablet Take 1 tablet (5 mg total) by mouth daily. 30 tablet 11  . losartan (COZAAR) 50 MG tablet TAKE 1 TABLET EVERY DAY 30 tablet 11  . metoprolol succinate (TOPROL-XL) 25 MG 24 hr tablet TAKE 3 TABLETS BY MOUTH EVERY DAY TAKE WITH OR IMMEDIATELY AFTER BREAKFAST 90 tablet 11  . predniSONE (DELTASONE) 20 MG tablet Take 2 tablets (40 mg total) by mouth daily with breakfast. 14 tablet 0  . tamsulosin (FLOMAX) 0.4 MG CAPS capsule Take 1 capsule (0.4 mg total) by mouth daily. 30 capsule 3  . tiotropium (SPIRIVA HANDIHALER) 18 MCG inhalation capsule Place 1 capsule (18 mcg total) into inhaler and inhale daily. 30 capsule 11  . UNABLE TO FIND 1 each by Other route as needed. Dispense One Nurse, learning disability. ICD I50.33, J96.20,M19.90 1 each 0   No current facility-administered medications on file prior to visit.   Allergies  Allergen Reactions  . Niaspan [Niacin Er] Anaphylaxis    History   Social History  . Marital Status: Married    Spouse Name: Nicole Cella  . Number of Children: 4  . Years of Education: N/A   Occupational History  . Works PT as a Education administrator    Social History Main Topics  . Smoking status: Former Smoker -- 1.00 packs/day for 56 years    Types: Cigarettes    Quit date: 07/31/2013  . Smokeless tobacco: Former Neurosurgeon    Types: Chew     Comment: 09/08/2013 "stopped smoking cigarettes ~ 2 months ago; aien't chew in probably 10 yrs"  . Alcohol Use: No  . Drug Use: No  .  Sexual Activity: Not Currently   Other Topics Concern  . Not on file   Social History Narrative   Married.  Lives with wife.  Ambulates without assistance.     Review of Systems  All other systems reviewed and are negative.      Objective:   Physical Exam  Constitutional: He appears well-developed and well-nourished. No distress.  Neck: Neck supple. No JVD present.  Cardiovascular: Normal rate, regular rhythm and normal heart sounds.   Pulmonary/Chest: No accessory muscle usage. No tachypnea. No respiratory distress. He has decreased breath sounds. He has wheezes.  Abdominal: Soft. Bowel sounds are normal.  Musculoskeletal: He exhibits no edema.  Lymphadenopathy:    He has no cervical adenopathy.  Skin: He is not diaphoretic.  Vitals reviewed.         Assessment & Plan:  COPD exacerbation  complete prednisone. Complete Zithromax. I instructed the patient I want him to liberalize his use of albuterol. He is to use 2 puffs every 6 hours for the next few days to help fight the bronchospasm, increase his lung volumes, and help his shortness of breath. Continue his breathing Symbicort. Recheck on Friday or sooner if worse

## 2014-12-15 NOTE — Patient Instructions (Addendum)
Mark [name]  12/15/2014   Your procedure is scheduled on: Wednesday April 6th, 2016  Report to [facility] Main  Entrance and follow signs to               Short Stay Center at 920 AM.  Call this number if you have problems the morning of surgery [phone]   Remember:  Do not eat food or drink liquids :After Midnight.     Take these medicines the morning of surgery with A SIP OF WATER: Amiodarone (Pacerone), Metoprolol, Diltiazem, use Symbicort inhaler, Use Spiriva inhaler, use Albuterol inhaler if needed                               You may not have any metal on your body including hair pins and              piercings  Do not wear jewelry, make-up, lotions, powders or perfumes.             Do not wear nail polish.  Do not shave  48 hours prior to surgery.              Men may shave face and neck.   Do not bring valuables to the hospital. [facility] IS NOT             RESPONSIBLE   FOR VALUABLES.  Contacts, dentures or bridgework may not be worn into surgery.  Leave suitcase in the car. After surgery it may be brought to your room.     Patients discharged the day of surgery will not be allowed to drive home.  Name and phone number of your driver:  Special Instructions: N/A              Please read over the following fact sheets you were given: _____________________________________________________________________             [facility] - Preparing [facility] Before surgery, you can play an important role.  Because skin is not sterile, your skin needs to be as free of germs as possible.  You can reduce the number of germs on your skin by washing with CHG (chlorahexidine gluconate) soap before surgery.  CHG is an antiseptic cleaner which kills germs and bonds with the skin to continue killing germs even after washing. Please DO NOT use if you have an allergy to CHG or antibacterial soaps.  If your skin becomes reddened/irritated stop using the CHG  and inform your nurse when you arrive at Short Stay. Do not shave (including legs and underarms) for at least 48 hours prior to the first CHG shower.  You may shave your face/neck. Please follow these instructions carefully:  1.  Shower with CHG Soap the night before surgery and the  morning of Surgery.  2.  If you choose to wash your hair, wash your hair first as usual with your  normal  shampoo.  3.  After you shampoo, rinse your hair and body thoroughly to remove the  shampoo.                           4.  Use CHG as you would any other liquid soap.  You can apply chg directly  to the skin and wash  Gently with a scrungie or clean washcloth.  5.  Apply the CHG Soap to your body ONLY FROM THE NECK DOWN.   Do not use on face/ open                           Wound or open sores. Avoid contact with eyes, ears mouth and genitals (private parts).                       Wash face,  Genitals (private parts) with your normal soap.             6.  Wash thoroughly, paying special attention to the area where your surgery  will be performed.  7.  Thoroughly rinse your body with warm water from the neck down.  8.  DO NOT shower/wash with your normal soap after using and rinsing off  the CHG Soap.                9.  Pat yourself dry with a clean towel.            10.  Wear clean pajamas.            11.  Place clean sheets on your bed the night of your first shower and do not  sleep with pets. Day of Surgery : Do not apply any lotions/deodorants the morning of surgery.  Please wear clean clothes to the hospital/surgery center.  FAILURE TO FOLLOW THESE INSTRUCTIONS MAY RESULT IN THE CANCELLATION OF YOUR SURGERY PATIENT SIGNATURE_________________________________  NURSE SIGNATURE__________________________________  ________________________________________________________________________

## 2014-12-15 NOTE — Progress Notes (Signed)
lov dr Patty Sermonsbrackbill cardiology 11-04-14 epic 10-07-14 chest 2 view  Xray epic Echo 08-27-14 epic 10-07-14 ekg epic

## 2014-12-16 ENCOUNTER — Ambulatory Visit (HOSPITAL_COMMUNITY)
Admission: RE | Admit: 2014-12-16 | Discharge: 2014-12-16 | Disposition: A | Discharge status: Home / Self Care | Payer: Medicare Other | Source: Ambulatory Visit | Attending: Anesthesiology | Admitting: Anesthesiology

## 2014-12-16 ENCOUNTER — Encounter (HOSPITAL_COMMUNITY): Payer: Self-pay

## 2014-12-16 ENCOUNTER — Encounter (HOSPITAL_COMMUNITY)
Admission: RE | Admit: 2014-12-16 | Discharge: 2014-12-16 | Disposition: A | Discharge status: Home / Self Care | Payer: Medicare Other | Source: Ambulatory Visit | Attending: General Surgery | Admitting: General Surgery

## 2014-12-16 DIAGNOSIS — K4090 Unilateral inguinal hernia, without obstruction or gangrene, not specified as recurrent: Secondary | ICD-10-CM | POA: Diagnosis not present

## 2014-12-16 DIAGNOSIS — R05 Cough: Secondary | ICD-10-CM | POA: Insufficient documentation

## 2014-12-16 DIAGNOSIS — R0989 Other specified symptoms and signs involving the circulatory and respiratory systems: Secondary | ICD-10-CM | POA: Insufficient documentation

## 2014-12-16 DIAGNOSIS — Z01818 Encounter for other preprocedural examination: Secondary | ICD-10-CM | POA: Insufficient documentation

## 2014-12-16 DIAGNOSIS — J449 Chronic obstructive pulmonary disease, unspecified: Secondary | ICD-10-CM | POA: Insufficient documentation

## 2014-12-16 DIAGNOSIS — R0602 Shortness of breath: Secondary | ICD-10-CM | POA: Diagnosis not present

## 2014-12-16 DIAGNOSIS — Z87891 Personal history of nicotine dependence: Secondary | ICD-10-CM | POA: Insufficient documentation

## 2014-12-16 DIAGNOSIS — E119 Type 2 diabetes mellitus without complications: Secondary | ICD-10-CM | POA: Diagnosis not present

## 2014-12-16 DIAGNOSIS — I10 Essential (primary) hypertension: Secondary | ICD-10-CM

## 2014-12-16 LAB — BASIC METABOLIC PANEL
ANION GAP: 9 (ref 5–15)
BUN: 41 mg/dL — AB (ref 6–23)
CHLORIDE: 103 mmol/L (ref 96–112)
CO2: 28 mmol/L (ref 19–32)
Calcium: 9.3 mg/dL (ref 8.4–10.5)
Creatinine, Ser: 2.7 mg/dL — ABNORMAL HIGH (ref 0.50–1.35)
GFR, EST AFRICAN AMERICAN: 26 mL/min — AB (ref >90)
GFR, EST NON AFRICAN AMERICAN: 22 mL/min — AB (ref >90)
Glucose, Bld: 96 mg/dL (ref 70–99)
Potassium: 5 mmol/L (ref 3.5–5.1)
Sodium: 140 mmol/L (ref 135–145)

## 2014-12-16 LAB — CBC
HEMATOCRIT: 39.7 % (ref 39.0–52.0)
Hemoglobin: 12.8 g/dL — ABNORMAL LOW (ref 13.0–17.0)
MCH: 29.9 pg (ref 26.0–34.0)
MCHC: 32.2 g/dL (ref 30.0–36.0)
MCV: 92.8 fL (ref 78.0–100.0)
Platelets: 237 10*3/uL (ref 150–400)
RBC: 4.28 MIL/uL (ref 4.22–5.81)
RDW: 19 % — ABNORMAL HIGH (ref 11.5–15.5)
WBC: 13.6 10*3/uL — ABNORMAL HIGH (ref 4.0–10.5)

## 2014-12-16 NOTE — Progress Notes (Signed)
   12/16/14 1058  OBSTRUCTIVE SLEEP APNEA  Have you ever been diagnosed with sleep apnea through a sleep study? No  Do you snore loudly (loud enough to be heard through closed doors)?  1  Do you often feel tired, fatigued, or sleepy during the daytime? 1  Has anyone observed you stop breathing during your sleep? 0  Do you have, or are you being treated for high blood pressure? 1  BMI more than 35 kg/m2? 0  Age over 71 years old? 1  Neck circumference greater than 40 cm/16 inches? 0  Gender: 1

## 2014-12-17 ENCOUNTER — Encounter: Payer: Self-pay | Admitting: Family Medicine

## 2014-12-17 ENCOUNTER — Ambulatory Visit (INDEPENDENT_AMBULATORY_CARE_PROVIDER_SITE_OTHER): Payer: Medicare Other | Admitting: Family Medicine

## 2014-12-17 VITALS — BP 126/70 | HR 68 | Temp 97.4°F | Resp 20 | Ht 66.5 in | Wt 139.5 lb

## 2014-12-17 DIAGNOSIS — J441 Chronic obstructive pulmonary disease with (acute) exacerbation: Secondary | ICD-10-CM

## 2014-12-17 NOTE — Progress Notes (Signed)
Subjective:    Patient ID: Mark [name], male    DOB: 1946-01-08, 69 y.o.   MRN: [id number]  HPI  12/09/14 Patient has COPD. He is currently on Symbicort as well as Spiriva. Over the last week he has developed a cough productive of thick yellow mucus. He reports chest congestion. He reports shortness of breath. His pulse oximetry earlier in the week was in the 70s. He states that he is slightly better today is his pulse oximetry has come up to 88% on room air. He denies chest pain. He does report shortness of breath. He refuses to go the hospital. He also refuses oxygen. Patient states that his symptoms are previous to similar COPD exacerbations. Today on examination there is no evidence of peripheral edema so she is has had in the past with congestive heart failure. On lung exam today he has markedly diminished breath sounds with occasional expiratory wheezes but no crackles Rales. He has no JVD. His weight is stable from his last office visit. There is no evidence of fluid overload on exam.  At that time, my plan was: I believe the patient is having a COPD exacerbation. I will start the patient on prednisone 40 mg a day for 7 days. I'll also cover the patient with azithromycin 500 mg by mouth daily one, 250 mg by mouth daily as 2 through 5. I am aware of the mornings of QTC prolongation on amiodarone however the patient has tolerated this combination in the past and has worked well for him. He can use albuterol 2 puffs inhaled every 6 hours as needed. Continue his breathing Symbicort and recheck Monday or immediately if worse  12/13/14 Patient is doing much better. Today he is 90-91% on room air versus 86%. His breathing is better. He is using his Symbicort and Spiriva on a daily basis. He is taking the prednisone and antibiotics. He has not been using his albuterol at all. He continues to have a cough productive of yellow and thick sputum. He continues to have some mild shortness of breath. He  denies any chest pain or orthopnea.  At taht time, my plan was:  complete prednisone. Complete Zithromax. I instructed the patient I want him to liberalize his use of albuterol. He is to use 2 puffs every 6 hours for the next few days to help fight the bronchospasm, increase his lung volumes, and help his shortness of breath. Continue his breathing Symbicort. Recheck on Friday or sooner if worse  12/17/14 Breathing is much better. Pulse oximetry was 96% on room air. Patient is no longer wheezing. Chest x-ray shows patchy densities in both lungs consistent with mild infiltrate versus CHF. I believe it is due to infiltrate. There is no JVD. There is no peripheral edema. The patient's weight is stable. There is no evidence of decompensated congestive heart failure. Therefore I believe the infiltrate should improve with time after treating him with antibiotics and steroids Past Medical History  Diagnosis Date  . GERD (gastroesophageal reflux disease)   . Hyperlipidemia   . H/O hiatal hernia   . Hypertension     Sees Dr. Lynnea Ferrier  . AAA (abdominal aortic aneurysm)     a. 09/2013: Resection and grafting of abdominal aortic aneurysm with insertion of an aorto by common iliac graft using 18 x 9 mm Hemashield Dacron graft.  Marland Kitchen COPD (chronic obstructive pulmonary disease)   . Chronic diastolic CHF (congestive heart failure)     a.  Echo (08/13/2013):  EF 55-60%, normal wall motion, mild MR, mild LAE  . Pneumonia   . Arthritis     "little in my hands" (09/08/2013)  . Chronic kidney disease (CKD), stage III (moderate)   . PAF (paroxysmal atrial fibrillation)     a. amiodarone Rx started 08/2013;  b. s/p TEE-DCCV 09/2013 => recurrent AFib => repeat DCCV => NSR;   c. Xarelto 15 QD due to CKD  . Hx of cardiovascular stress test     a. Myoview 09/2012:  no scar or ischemia  . 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 Knee arthroscopy Right     . Abdominal aortic aneurysm repair  10/15/2012    Procedure: ANEURYSM ABDOMINAL AORTIC REPAIR;  Surgeon: Pryor Ochoa, MD;  Location: [facility] OR;  Service: Vascular;  Laterality: N/A;  Resection and Grafting of Abdominal Aortic Aneurysm - Aortobi-Iliac   . Tee without cardioversion N/A 09/22/2013    Procedure: TRANSESOPHAGEAL ECHOCARDIOGRAM (TEE);  Surgeon: Lars Masson, MD;  Location: [facility] ENDOSCOPY;  Service: Cardiovascular;  Laterality: N/A;  . Cardioversion N/A 09/22/2013    Procedure: CARDIOVERSION;  Surgeon: Lars Masson, MD;  Location: [facility] ENDOSCOPY;  Service: Cardiovascular;  Laterality: N/A;  . Abdominal aortic aneurysm repair  10-15-2012  . Cardioversion  Jan. 12, 2015    Dr. Doylene Canning. Ladona Ridgel  . Cardioversion N/A 09/28/2013    Procedure: CARDIOVERSION;  Surgeon: Marinus Maw, MD;  Location: [facility] CATH LAB;  Service: Cardiovascular;  Laterality: N/A;   Current Outpatient Prescriptions on File Prior to Visit  Medication Sig Dispense Refill  . albuterol (VENTOLIN HFA) 108 (90 BASE) MCG/ACT inhaler Inhale 2 puffs into the lungs every 6 (six) hours as needed for wheezing or shortness of breath. 1 Inhaler 6  . amiodarone (PACERONE) 200 MG tablet TAKE 1 TABLET (200 MG TOTAL) BY MOUTH DAILY. 30 tablet 5  . aspirin 325 MG tablet Take 325 mg by mouth daily.    Marland Kitchen atorvastatin (LIPITOR) 10 MG tablet Take 1 tablet (10 mg total) by mouth daily. 90 tablet 3  . budesonide-formoterol (SYMBICORT) 160-4.5 MCG/ACT inhaler Inhale 2 puffs into the lungs 2 (two) times daily. 1 Inhaler 11  . diltiazem (TAZTIA XT) 360 MG 24 hr capsule Take 360 mg by mouth every morning.     . furosemide (LASIX) 40 MG tablet Take 1 tablet (40 mg total) by mouth daily. 60 tablet 5  . linagliptin (TRADJENTA) 5 MG TABS tablet Take 1 tablet (5 mg total) by mouth daily. 30 tablet 11  . losartan (COZAAR) 50 MG tablet TAKE 1 TABLET EVERY DAY 30 tablet 11  . metoprolol succinate (TOPROL-XL) 25 MG 24 hr tablet TAKE 3 TABLETS BY MOUTH  EVERY DAY TAKE WITH OR IMMEDIATELY AFTER BREAKFAST 90 tablet 11  . predniSONE (DELTASONE) 20 MG tablet Take 2 tablets (40 mg total) by mouth daily with breakfast. 14 tablet 0  . ranitidine (ZANTAC) 150 MG tablet Take 150 mg by mouth daily as needed for heartburn.    . tamsulosin (FLOMAX) 0.4 MG CAPS capsule Take 1 capsule (0.4 mg total) by mouth daily. (Patient taking differently: Take 0.4 mg by mouth at bedtime. ) 30 capsule 3  . tiotropium (SPIRIVA HANDIHALER) 18 MCG inhalation capsule Place 1 capsule (18 mcg total) into inhaler and inhale daily. 30 capsule 11   No current facility-administered medications on file prior to visit.   Allergies  Allergen Reactions  .  Niaspan [Niacin Er] Anaphylaxis   History   Social History  . Marital Status: Married    Spouse Name: [name]  . Number of Children: 4  . Years of Education: N/A   Occupational History  . Works PT as a Education administratorpainter    Social History Main Topics  . Smoking status: Former Smoker -- 1.00 packs/day for 56 years    Types: Cigarettes    Quit date: 07/31/2013  . Smokeless tobacco: Former NeurosurgeonUser    Types: Chew     Comment: 09/08/2013 "stopped smoking cigarettes ~ 2 months ago; aien't chew in probably 10 yrs"  . Alcohol Use: No  . Drug Use: No  . Sexual Activity: Not Currently   Other Topics Concern  . Not on file   Social History Narrative   Married.  Lives with wife.  Ambulates without assistance.     Review of Systems  All other systems reviewed and are negative.      Objective:   Physical Exam  Constitutional: He appears well-developed and well-nourished. No distress.  Neck: Neck supple. No JVD present.  Cardiovascular: Normal rate, regular rhythm and normal heart sounds.   Pulmonary/Chest: No accessory muscle usage. No tachypnea. No respiratory distress. He has decreased breath sounds. He has wheezes.  Abdominal: Soft. Bowel sounds are normal.  Musculoskeletal: He exhibits no edema.  Lymphadenopathy:    He has  no cervical adenopathy.  Skin: He is not diaphoretic.  Vitals reviewed.         Assessment & Plan:  COPD exacerbation  Patient is clinically improving. I will recheck the patient in one month. At that time I'll repeat a chest x-ray. Return immediately if symptoms worsen. I see no evidence requiring diuresis at the present time. I believe the infiltrate should improve given time after treatment with Zithromax and steroids. Therefore I'll repeat a chest x-ray in one month..Marland Kitchen

## 2014-12-22 ENCOUNTER — Ambulatory Visit (HOSPITAL_COMMUNITY): Payer: Medicare Other | Admitting: Anesthesiology

## 2014-12-22 ENCOUNTER — Encounter (HOSPITAL_COMMUNITY): Payer: Self-pay | Admitting: *Deleted

## 2014-12-22 ENCOUNTER — Encounter (HOSPITAL_COMMUNITY)
Admission: RE | Disposition: A | Discharge status: Home / Self Care | Payer: Self-pay | Source: Ambulatory Visit | Attending: General Surgery

## 2014-12-22 ENCOUNTER — Ambulatory Visit (HOSPITAL_COMMUNITY)
Admission: RE | Admit: 2014-12-22 | Discharge: 2014-12-22 | Disposition: A | Discharge status: Home / Self Care | Payer: Medicare Other | Source: Ambulatory Visit | Attending: General Surgery | Admitting: General Surgery

## 2014-12-22 DIAGNOSIS — K219 Gastro-esophageal reflux disease without esophagitis: Secondary | ICD-10-CM

## 2014-12-22 DIAGNOSIS — M1990 Unspecified osteoarthritis, unspecified site: Secondary | ICD-10-CM | POA: Insufficient documentation

## 2014-12-22 DIAGNOSIS — Z7982 Long term (current) use of aspirin: Secondary | ICD-10-CM | POA: Insufficient documentation

## 2014-12-22 DIAGNOSIS — E119 Type 2 diabetes mellitus without complications: Secondary | ICD-10-CM | POA: Insufficient documentation

## 2014-12-22 DIAGNOSIS — N400 Enlarged prostate without lower urinary tract symptoms: Secondary | ICD-10-CM | POA: Diagnosis present

## 2014-12-22 DIAGNOSIS — N183 Chronic kidney disease, stage 3 (moderate): Secondary | ICD-10-CM

## 2014-12-22 DIAGNOSIS — J9601 Acute respiratory failure with hypoxia: Secondary | ICD-10-CM | POA: Diagnosis present

## 2014-12-22 DIAGNOSIS — J441 Chronic obstructive pulmonary disease with (acute) exacerbation: Secondary | ICD-10-CM | POA: Diagnosis not present

## 2014-12-22 DIAGNOSIS — J439 Emphysema, unspecified: Secondary | ICD-10-CM

## 2014-12-22 DIAGNOSIS — E785 Hyperlipidemia, unspecified: Secondary | ICD-10-CM

## 2014-12-22 DIAGNOSIS — I509 Heart failure, unspecified: Secondary | ICD-10-CM | POA: Insufficient documentation

## 2014-12-22 DIAGNOSIS — I5033 Acute on chronic diastolic (congestive) heart failure: Secondary | ICD-10-CM | POA: Diagnosis not present

## 2014-12-22 DIAGNOSIS — Z8679 Personal history of other diseases of the circulatory system: Secondary | ICD-10-CM

## 2014-12-22 DIAGNOSIS — E780 Pure hypercholesterolemia: Secondary | ICD-10-CM

## 2014-12-22 DIAGNOSIS — Z87891 Personal history of nicotine dependence: Secondary | ICD-10-CM

## 2014-12-22 DIAGNOSIS — I129 Hypertensive chronic kidney disease with stage 1 through stage 4 chronic kidney disease, or unspecified chronic kidney disease: Secondary | ICD-10-CM | POA: Insufficient documentation

## 2014-12-22 DIAGNOSIS — Z888 Allergy status to other drugs, medicaments and biological substances status: Secondary | ICD-10-CM

## 2014-12-22 DIAGNOSIS — R0602 Shortness of breath: Secondary | ICD-10-CM | POA: Diagnosis not present

## 2014-12-22 DIAGNOSIS — I480 Paroxysmal atrial fibrillation: Secondary | ICD-10-CM

## 2014-12-22 DIAGNOSIS — N184 Chronic kidney disease, stage 4 (severe): Secondary | ICD-10-CM | POA: Diagnosis present

## 2014-12-22 DIAGNOSIS — Z7901 Long term (current) use of anticoagulants: Secondary | ICD-10-CM

## 2014-12-22 DIAGNOSIS — E1121 Type 2 diabetes mellitus with diabetic nephropathy: Secondary | ICD-10-CM | POA: Diagnosis present

## 2014-12-22 DIAGNOSIS — I714 Abdominal aortic aneurysm, without rupture: Secondary | ICD-10-CM | POA: Diagnosis present

## 2014-12-22 DIAGNOSIS — I482 Chronic atrial fibrillation: Secondary | ICD-10-CM | POA: Diagnosis present

## 2014-12-22 DIAGNOSIS — K4090 Unilateral inguinal hernia, without obstruction or gangrene, not specified as recurrent: Secondary | ICD-10-CM

## 2014-12-22 HISTORY — PX: INGUINAL HERNIA REPAIR: SHX194

## 2014-12-22 HISTORY — PX: INSERTION OF MESH: SHX5868

## 2014-12-22 LAB — GLUCOSE, CAPILLARY
GLUCOSE-CAPILLARY: 93 mg/dL (ref 70–99)
Glucose-Capillary: 106 mg/dL — ABNORMAL HIGH (ref 70–99)

## 2014-12-22 SURGERY — REPAIR, HERNIA, INGUINAL, ADULT
Anesthesia: Monitor Anesthesia Care | Site: Groin | Laterality: Right

## 2014-12-22 MED ORDER — SODIUM CHLORIDE 0.9 % IJ SOLN: 3.0000 mL | Freq: Two times a day (BID) | INTRAMUSCULAR | Status: DC

## 2014-12-22 MED ORDER — ALBUTEROL SULFATE (2.5 MG/3ML) 0.083% IN NEBU
2.5000 mg | INHALATION_SOLUTION | Freq: Four times a day (QID) | RESPIRATORY_TRACT | Status: DC | PRN
  Administered 2014-12-22: 2.5 mg via RESPIRATORY_TRACT

## 2014-12-22 MED ORDER — MIDAZOLAM HCL 5 MG/5ML IJ SOLN
INTRAMUSCULAR | Status: DC | PRN
  Administered 2014-12-22 (×2): 1 mg via INTRAVENOUS

## 2014-12-22 MED ORDER — SODIUM CHLORIDE 0.9 % IJ SOLN: 3.0000 mL | INTRAMUSCULAR | Status: DC | PRN

## 2014-12-22 MED ORDER — PROPOFOL 10 MG/ML IV BOLUS
INTRAVENOUS | Status: AC
  Filled 2014-12-22: qty 20

## 2014-12-22 MED ORDER — 0.9 % SODIUM CHLORIDE (POUR BTL) OPTIME
TOPICAL | Status: DC | PRN
  Administered 2014-12-22: 1000 mL

## 2014-12-22 MED ORDER — FENTANYL CITRATE 0.05 MG/ML IJ SOLN: 25.0000 ug | INTRAMUSCULAR | Status: DC | PRN

## 2014-12-22 MED ORDER — CEFAZOLIN SODIUM-DEXTROSE 2-3 GM-% IV SOLR
2.0000 g | INTRAVENOUS | Status: AC
  Administered 2014-12-22: 2 g via INTRAVENOUS

## 2014-12-22 MED ORDER — BUPIVACAINE-EPINEPHRINE 0.25% -1:200000 IJ SOLN
INTRAMUSCULAR | Status: DC | PRN
  Administered 2014-12-22: 28 mL

## 2014-12-22 MED ORDER — ONDANSETRON HCL 4 MG/2ML IJ SOLN
INTRAMUSCULAR | Status: DC | PRN
  Administered 2014-12-22: 4 mg via INTRAVENOUS

## 2014-12-22 MED ORDER — CHLORHEXIDINE GLUCONATE 4 % EX LIQD: 1.0000 "application " | Freq: Once | CUTANEOUS | Status: DC

## 2014-12-22 MED ORDER — ONDANSETRON HCL 4 MG/2ML IJ SOLN
INTRAMUSCULAR | Status: AC
  Filled 2014-12-22: qty 2

## 2014-12-22 MED ORDER — SODIUM CHLORIDE 0.9 % IV SOLN
INTRAVENOUS | Status: DC
  Administered 2014-12-22: 1000 mL via INTRAVENOUS

## 2014-12-22 MED ORDER — FENTANYL CITRATE 0.05 MG/ML IJ SOLN
INTRAMUSCULAR | Status: AC
  Filled 2014-12-22: qty 2

## 2014-12-22 MED ORDER — OXYCODONE HCL 5 MG PO TABS: 5.0000 mg | ORAL_TABLET | ORAL | Status: DC | PRN

## 2014-12-22 MED ORDER — LIDOCAINE HCL (CARDIAC) 20 MG/ML IV SOLN
INTRAVENOUS | Status: DC | PRN
  Administered 2014-12-22: 50 mg via INTRAVENOUS

## 2014-12-22 MED ORDER — MIDAZOLAM HCL 2 MG/2ML IJ SOLN
INTRAMUSCULAR | Status: AC
  Filled 2014-12-22: qty 2

## 2014-12-22 MED ORDER — OXYCODONE-ACETAMINOPHEN 5-325 MG PO TABS: 1.0000 | ORAL_TABLET | ORAL | Status: DC | PRN

## 2014-12-22 MED ORDER — BUPIVACAINE-EPINEPHRINE (PF) 0.25% -1:200000 IJ SOLN
INTRAMUSCULAR | Status: AC
  Filled 2014-12-22: qty 30

## 2014-12-22 MED ORDER — SODIUM CHLORIDE 0.9 % IV SOLN: 250.0000 mL | INTRAVENOUS | Status: DC | PRN

## 2014-12-22 MED ORDER — PROPOFOL INFUSION 10 MG/ML OPTIME
INTRAVENOUS | Status: DC | PRN
  Administered 2014-12-22: 100 ug/kg/min via INTRAVENOUS

## 2014-12-22 MED ORDER — FENTANYL CITRATE 0.05 MG/ML IJ SOLN
INTRAMUSCULAR | Status: DC | PRN
  Administered 2014-12-22: 50 ug via INTRAVENOUS
  Administered 2014-12-22 (×2): 25 ug via INTRAVENOUS

## 2014-12-22 MED ORDER — CEFAZOLIN SODIUM-DEXTROSE 2-3 GM-% IV SOLR
INTRAVENOUS | Status: AC
  Filled 2014-12-22: qty 50

## 2014-12-22 MED ORDER — ALBUTEROL SULFATE (2.5 MG/3ML) 0.083% IN NEBU
INHALATION_SOLUTION | RESPIRATORY_TRACT | Status: AC
  Filled 2014-12-22: qty 3

## 2014-12-22 MED ORDER — ACETAMINOPHEN 325 MG PO TABS: 650.0000 mg | ORAL_TABLET | ORAL | Status: DC | PRN

## 2014-12-22 MED ORDER — ACETAMINOPHEN 650 MG RE SUPP: 650.0000 mg | RECTAL | Status: DC | PRN

## 2014-12-22 SURGICAL SUPPLY — 39 items
APL SKNCLS STERI-STRIP NONHPOA (GAUZE/BANDAGES/DRESSINGS)
BAG URINE DRAINAGE (UROLOGICAL SUPPLIES) IMPLANT
BENZOIN TINCTURE PRP APPL 2/3 (GAUZE/BANDAGES/DRESSINGS) IMPLANT
BLADE HEX COATED 2.75 (ELECTRODE) ×4 IMPLANT
BLADE SURG 15 STRL LF DISP TIS (BLADE) ×2 IMPLANT
BLADE SURG 15 STRL SS (BLADE) ×4
CATH FOLEY 3WAY 30CC 16FR (CATHETERS) IMPLANT
CHLORAPREP W/TINT 26ML (MISCELLANEOUS) ×4 IMPLANT
CLOSURE WOUND 1/2 X4 (GAUZE/BANDAGES/DRESSINGS)
DECANTER SPIKE VIAL GLASS SM (MISCELLANEOUS) IMPLANT
DRAIN PENROSE 18X1/2 LTX STRL (DRAIN) ×4 IMPLANT
DRAPE LAPAROSCOPIC ABDOMINAL (DRAPES) ×4 IMPLANT
ELECT REM PT RETURN 9FT ADLT (ELECTROSURGICAL) ×4
ELECTRODE REM PT RTRN 9FT ADLT (ELECTROSURGICAL) ×2 IMPLANT
GAUZE SPONGE 4X4 16PLY XRAY LF (GAUZE/BANDAGES/DRESSINGS) ×4 IMPLANT
GLOVE BIO SURGEON STRL SZ7.5 (GLOVE) ×28 IMPLANT
GOWN STRL REUS W/TWL XL LVL3 (GOWN DISPOSABLE) ×16 IMPLANT
KIT BASIN OR (CUSTOM PROCEDURE TRAY) ×4 IMPLANT
LIQUID BAND (GAUZE/BANDAGES/DRESSINGS) ×4 IMPLANT
MESH PARIETEX PROGRIP RIGHT (Mesh General) ×4 IMPLANT
NEEDLE HYPO 22GX1.5 SAFETY (NEEDLE) ×4 IMPLANT
PACK BASIC VI WITH GOWN DISP (CUSTOM PROCEDURE TRAY) ×4 IMPLANT
PENCIL BUTTON HOLSTER BLD 10FT (ELECTRODE) ×4 IMPLANT
SET IRRIG Y TYPE TUR BLADDER L (SET/KITS/TRAYS/PACK) IMPLANT
STRIP CLOSURE SKIN 1/2X4 (GAUZE/BANDAGES/DRESSINGS) IMPLANT
SUT MNCRL AB 4-0 PS2 18 (SUTURE) ×4 IMPLANT
SUT PROLENE 2 0 SH DA (SUTURE) ×4 IMPLANT
SUT SILK 2 0 (SUTURE)
SUT SILK 2-0 18XBRD TIE 12 (SUTURE) IMPLANT
SUT VIC AB 2-0 SH 27 (SUTURE) ×8
SUT VIC AB 2-0 SH 27X BRD (SUTURE) ×4 IMPLANT
SUT VIC AB 3-0 SH 27 (SUTURE) ×4
SUT VIC AB 3-0 SH 27XBRD (SUTURE) ×2 IMPLANT
SYR BULB IRRIGATION 50ML (SYRINGE) ×4 IMPLANT
SYR CONTROL 10ML LL (SYRINGE) ×4 IMPLANT
TOWEL OR 17X26 10 PK STRL BLUE (TOWEL DISPOSABLE) ×4 IMPLANT
TOWEL OR NON WOVEN STRL DISP B (DISPOSABLE) ×4 IMPLANT
TRAY FOLEY CATH 16FRSI W/METER (SET/KITS/TRAYS/PACK) IMPLANT
YANKAUER SUCT BULB TIP 10FT TU (MISCELLANEOUS) ×4 IMPLANT

## 2014-12-22 NOTE — Interval H&P Note (Signed)
History and Physical Interval Note:  12/22/2014 8:14 AM  Mark [name]  has presented today for surgery, with the diagnosis of RIGHT INGUINAL HERNIA  The various methods of treatment have been discussed with the patient and family. After consideration of risks, benefits and other options for treatment, the patient has consented to  Procedure(s): OPEN RIGHT INGUINAL HERNIA REPAIR WITH MESH (Right) INSERTION OF MESH (N/A) as a surgical intervention .  The patient's history has been reviewed, patient examined, no change in status, stable for surgery.  I have reviewed the patient's chart and labs.  Questions were answered to the patient's satisfaction.     Marigene Ehlersamirez Jr., Jed LimerickArmando

## 2014-12-22 NOTE — Op Note (Signed)
12/22/2014  11:39 AM  PATIENT:  Mark [name]  69 y.o. male  PRE-OPERATIVE DIAGNOSIS:  RIGHT INGUINAL HERNIA  POST-OPERATIVE DIAGNOSIS:  right inguinal hernia  PROCEDURE:  Procedure(s): OPEN RIGHT INGUINAL HERNIA REPAIR WITH MESH (Right) INSERTION OF MESH (N/A)  SURGEON:  Surgeon(s) and Role:    * Axel FillerArmando Partick Musselman, MD - Primary  ANESTHESIA:   local and MAC  EBL:   <5cc  BLOOD ADMINISTERED:none  DRAINS: none   LOCAL MEDICATIONS USED:  BUPIVICAINE   SPECIMEN:  Source of Specimen:  Hernia sac  DISPOSITION OF SPECIMEN:  PATHOLOGY  COUNTS:  YES  TOURNIQUET:  * No tourniquets in log *  DICTATION: .Dragon Dictation  Details of the procedure: The patient was taken back to the operating room. The patient was placed in supine position with bilateral SCDs in place. The patient was prepped and draped in the usual sterile fashion.  After appropriate anitbiotics were confirmed, a time-out was confirmed and all facts were verified.    Quarter percent Marcaine was used to infiltrate the area of the incision and an ilioinguinal nerve block was also placed. A 4 cm incision was made just 1 cm superior to the inguinal ligament. Bovie cautery was used to maintain hemostasis dissection is carried down to the external oblique.  A standard incision was made laterally, and the external oblique was bluntly dissected away from the surrounding tissue with Metzenbaum scissors.The external oblique was elevated in the spermatic cord was bluntly dissected away from the surrounding tissue.  The ilioinguinal nerve was identified and ligated laterally with a 2-0 vicryl. The spermatic cord and the hernia were then bluntly dissected away from the pubic tubercle and a Penrose was placed around the hernia sac in the spermatic cord. The vas deferens was identified and protected at all portions of the case. Dissection of the cremasterics took place with Bovie cautery. Once the hernia sac was dissected away from the  surrrounding cremesteric tissue , the hernia sac was entered laterally. There was no sliding components and no femoral hernia palpated. The hernia sac was highly ligated using 2-0 Vicryl, and retracted sponataneously.  The specimen was sent to pathology.  At this time a left sided Progrip mesh was then anchored to the pubic tubercle with a 2-0 Prolene. The wrap around of the mesh was sutured to the conjoint tendon as well.  The new internal ring did not strangulate the spermatic cord.   The tail was then tucked under the external oblique. At this time the area was irrigated out with sterile saline.  The external oblique was reapproximated using a 2-0 Vicryl in a running fashion. Scarpa's fascia was then reapproximated using a 3-0 Vicryl running fashion. The skin was then reapproximated with 4 Monocryl in a subcuticular fashion. The skin was then dressed with Dermabond.  The patient was taken to the recovery room in stable condition.     PLAN OF CARE: Discharge to home after PACU  PATIENT DISPOSITION:  PACU - hemodynamically stable.   Delay start of Pharmacological VTE agent (>24hrs) due to surgical blood loss or risk of bleeding: not applicable

## 2014-12-22 NOTE — Anesthesia Preprocedure Evaluation (Addendum)
Anesthesia Evaluation  Patient identified by MRN, date of birth, ID band Patient awake    Reviewed: Allergy & Precautions, NPO status , Patient's Chart, lab work & pertinent test results  Airway Mallampati: II  TM Distance: >3 FB Neck ROM: Full    Dental   Pulmonary pneumonia -, COPDformer smoker,  breath sounds clear to auscultation        Cardiovascular hypertension, + Peripheral Vascular Disease and +CHF Rhythm:Regular Rate:Normal     Neuro/Psych    GI/Hepatic Neg liver ROS, hiatal hernia, GERD-  ,  Endo/Other  diabetes  Renal/GU Renal disease     Musculoskeletal   Abdominal   Peds  Hematology   Anesthesia Other Findings   Reproductive/Obstetrics                           Anesthesia Physical Anesthesia Plan  ASA: III  Anesthesia Plan: MAC   Post-op Pain Management:    Induction: Intravenous  Airway Management Planned: Simple Face Mask  Additional Equipment:   Intra-op Plan:   Post-operative Plan:   Informed Consent: I have reviewed the patients History and Physical, chart, labs and discussed the procedure including the risks, benefits and alternatives for the proposed anesthesia with the patient or authorized representative who has indicated his/her understanding and acceptance.   Dental advisory given  Plan Discussed with: CRNA, Anesthesiologist and Surgeon  Anesthesia Plan Comments:         Anesthesia Quick Evaluation

## 2014-12-22 NOTE — H&P (View-Only) (Signed)
History of Present Illness Mark [name](Mark Eisenhour MD; 11/29/2014 1:47 PM) Patient words: hernia.  The patient is a 69 year old male who presents with an inguinal hernia. Male who is referred by Dr. Tanya [name] for evaluation of a right inguinal hernia. The patient states that it's been there for several months. He states he has pain to the right area when he walks. He is able to reduce it. He has had no signs or symptoms of incarceration or strangulation. The patient does have a history of a previous open AAA repair. Patient also sees Mark [name]. The patient has had previous cardioversion for paroxysmal atrial fibrillation. The patient is currently off Xarelto and is only taking aspirin.   Other Problems Mark [name](Mark [name], CMA; 11/29/2014 1:30 PM) Chronic Obstructive Lung Disease Congestive Heart Failure Diabetes Mellitus Emphysema Of Lung Gastroesophageal Reflux Disease High blood pressure Hypercholesterolemia  Past Surgical History Mark [name](Mark [name], CMA; 11/29/2014 1:30 PM) Knee Surgery Left.  Diagnostic Studies History Mark [name]([name]; 11/29/2014 1:30 PM) Colonoscopy 5-10 years ago  Allergies Mark [name](Mark [name], CMA; 11/29/2014 1:31 PM) Niaspan *ANTIHYPERLIPIDEMICS*  Medication History Mark [name](Mark [name], CMA; 11/29/2014 1:31 PM) TraMADol HCl (50MG  Tablet, Oral) Active. Amiodarone HCl (200MG  Tablet, Oral) Active. Atorvastatin Calcium (10MG  Tablet, Oral) Active. Cyclobenzaprine HCl (10MG  Tablet, Oral) Active. Furosemide (40MG  Tablet, Oral) Active. Levofloxacin (750MG  Tablet, Oral) Active. Losartan Potassium (50MG  Tablet, Oral) Active. Metolazone (2.5MG  Tablet, Oral) Active. Metoprolol Succinate ER (25MG  Tablet ER 24HR, Oral) Active. PredniSONE (10MG  Tablet, Oral) Active. Tamsulosin HCl (0.4MG  Capsule, Oral) Active. Taztia XT (360MG  Capsule ER 24HR, Oral) Active. Xarelto (15MG  Tablet, Oral) Active. Medications Reconciled  Social History Mark [name]([name]; 11/29/2014 1:30  PM) Alcohol use Occasional alcohol use. Caffeine use Coffee, Tea. Illicit drug use Uses yearly. Tobacco use Former smoker.  Family History Mark [name]([name]; 11/29/2014 1:30 PM) Alcohol Abuse Father. Arthritis Family Members In General, Father. Breast Cancer Family Members In General, Father. Melanoma Family Members In General.  Review of Systems Mark [name](Mark [name] CMA; 11/29/2014 1:30 PM) General Present- Appetite Loss and Weight Loss. Not Present- Chills, Fatigue, Fever, Night Sweats and Weight Gain. Skin Present- Dryness. Not Present- Change in Wart/Mole, Hives, Jaundice, New Lesions, Non-Healing Wounds, Rash and Ulcer. HEENT Present- Hearing Loss. Not Present- Earache, Hoarseness, Nose Bleed, Oral Ulcers, Ringing in the Ears, Seasonal Allergies, Sinus Pain, Sore Throat, Visual Disturbances, Wears glasses/contact lenses and Yellow Eyes. Cardiovascular Present- Shortness of Breath. Not Present- Chest Pain, Difficulty Breathing Lying Down, Leg Cramps, Palpitations, Rapid Heart Rate and Swelling of Extremities. Gastrointestinal Present- Abdominal Pain, Constipation, Excessive gas and Vomiting. Not Present- Bloating, Bloody Stool, Change in Bowel Habits, Chronic diarrhea, Difficulty Swallowing, Gets full quickly at meals, Hemorrhoids, Indigestion, Nausea and Rectal Pain. Musculoskeletal Not Present- Back Pain, Joint Pain, Joint Stiffness, Muscle Pain, Muscle Weakness and Swelling of Extremities. Neurological Not Present- Decreased Memory, Fainting, Headaches, Numbness, Seizures, Tingling, Tremor, Trouble walking and Weakness. Psychiatric Not Present- Anxiety, Bipolar, Change in Sleep Pattern, Depression, Fearful and Frequent crying. Endocrine Not Present- Cold Intolerance, Excessive Hunger, Hair Changes, Heat Intolerance, Hot flashes and New Diabetes. Hematology Not Present- Easy Bruising, Excessive bleeding, Gland problems, HIV and Persistent Infections.   Vitals Mark [name](Mark [name] CMA; 11/29/2014  1:32 PM) 11/29/2014 1:31 PM Weight: 137 lb Height: 66in Body Surface Area: 1.7 m Body Mass Index: 22.11 kg/m Temp.: 97.43F(Temporal)  Pulse: 61 (Regular)  Resp.: 16 (Unlabored)  BP: 132/62 (Sitting, Left Arm, Standard)    Physical Exam Mark [name](Mark Saravia MD; 11/29/2014 1:45 PM) General Mental Status-Alert. General Appearance-Consistent with stated  age. Hydration-Well hydrated. Voice-Normal.  Head and Neck Head-normocephalic, atraumatic with no lesions or palpable masses. Trachea-midline. Thyroid Gland Characteristics - normal size and consistency.  Chest and Lung Exam Chest and lung exam reveals -quiet, even and easy respiratory effort with no use of accessory muscles and on auscultation, normal breath sounds, no adventitious sounds and normal vocal resonance. Inspection Chest Wall - Normal. Back - normal.  Cardiovascular Cardiovascular examination reveals -normal heart sounds, regular rate and rhythm with no murmurs and normal pedal pulses bilaterally.  Abdomen Inspection Skin - Scar - no surgical scars. Hernias - Inguinal hernia - Right - Reducible. Palpation/Percussion Normal exam - Soft, Non Tender, No Rebound tenderness, No Rigidity (guarding) and No hepatosplenomegaly. Auscultation Normal exam - Bowel sounds normal.    Assessment & Plan Mark Filler MD; 11/29/2014 1:48 PM) RIGHT INGUINAL HERNIA (550.90  K40.90) Impression: 69 year old male with a right inguinal hernia, likely indirect  1. The patient sees Dr. Patty Sermons and will require cardiac clearance prior to schedule surgery scheduling. 2. Once we have received cardiac clearance we'll proceed with open right inguinal hernia repair with mesh. 3. All risks and benefits were discussed with the patient to generally include, but not limited to: infection, bleeding, damage to surrounding structures, acute and chronic nerve pain, and recurrence. Alternatives were offered and described.  All questions were answered and the patient voiced understanding of the procedure and wishes to proceed at this point with hernia repair. Current Plans

## 2014-12-22 NOTE — Discharge Instructions (Signed)
[facility] _______[facility] ° °INGUINAL HERNIA REPAIR: POST OP INSTRUCTIONS ° °Always review your discharge instruction sheet given to you by the facility where your surgery was performed. °IF YOU HAVE DISABILITY OR FAMILY LEAVE FORMS, YOU MUST BRING THEM TO THE OFFICE FOR PROCESSING.   °DO NOT GIVE THEM TO YOUR DOCTOR. ° °1. A  prescription for pain medication may be given to you upon discharge.  Take your pain medication as prescribed, if needed.  If narcotic pain medicine is not needed, then you may take acetaminophen (Tylenol) or ibuprofen (Advil) as needed. °2. Take your usually prescribed medications unless otherwise directed. °3. If you need a refill on your pain medication, please contact your pharmacy.  They will contact our office to request authorization. Prescriptions will not be filled after 5 pm or on week-ends. °4. You should follow a light diet the first 24 hours after arrival home, such as soup and crackers, etc.  Be sure to include lots of fluids daily.  Resume your normal diet the day after surgery. °5. Most patients will experience some swelling and bruising around the umbilicus or in the groin and scrotum.  Ice packs and reclining will help.  Swelling and bruising can take several days to resolve.  °6. It is common to experience some constipation if taking pain medication after surgery.  Increasing fluid intake and taking a stool softener (such as Colace) will usually help or prevent this problem from occurring.  A mild laxative (Milk of Magnesia or Miralax) should be taken according to package directions if there are no bowel movements after 48 hours. °7. Unless discharge instructions indicate otherwise, you may remove your bandages 24-48 hours after surgery, and you may shower at that time.  You may have steri-strips (small skin tapes) in place directly over the incision.  These strips should be left on the skin for 7-10 days.  If your surgeon used skin glue on the incision, you  may shower in 24 hours.  The glue will flake off over the next 2-3 weeks.  Any sutures or staples will be removed at the office during your follow-up visit. °8. ACTIVITIES:  You may resume regular (light) daily activities beginning the next day--such as daily self-care, walking, climbing stairs--gradually increasing activities as tolerated.  You may have sexual intercourse when it is comfortable.  Refrain from any heavy lifting or straining until approved by your doctor. °a. You may drive when you are no longer taking prescription pain medication, you can comfortably wear a seatbelt, and you can safely maneuver your car and apply brakes. °b. RETURN TO WORK:  __________________________________________________________ °9. You should see your doctor in the office for a follow-up appointment approximately 2-3 weeks after your surgery.  Make sure that you call for this appointment within a day or two after you arrive home to insure a convenient appointment time. °10. OTHER INSTRUCTIONS:  __________________________________________________________________________________________________________________________________________________________________________________________  °WHEN TO CALL YOUR DOCTOR: °1. Fever over 101.0 °2. Inability to urinate °3. Nausea and/or vomiting °4. Extreme swelling or bruising °5. Continued bleeding from incision. °6. Increased pain, redness, or drainage from the incision ° °The clinic staff is available to answer your questions during regular business hours.  Please don’t hesitate to call and ask to speak to one of the nurses for clinical concerns.  If you have a medical emergency, go to the nearest emergency room or call 911.  A surgeon from [facility] is always on call at the hospital ° ° °[address], [address], [place], [place]  [address] ? ° [address] [address], [place], [place]   [address] °[phone] ? [phone] ? FAX [phone] °Web site:  [email or web address] ° °

## 2014-12-22 NOTE — Transfer of Care (Signed)
Immediate Anesthesia Transfer of Care Note  Patient: Mark [name]  Procedure(s) Performed: Procedure(s): OPEN RIGHT INGUINAL HERNIA REPAIR WITH MESH (Right) INSERTION OF MESH (N/A)  Patient Location: PACU  Anesthesia Type:MAC  Level of Consciousness: awake, alert  and oriented  Airway & Oxygen Therapy: Patient Spontanous Breathing and Patient connected to face mask oxygen  Post-op Assessment: Report given to RN and Post -op Vital signs reviewed and stable  Post vital signs: Reviewed and stable  Last Vitals:  Filed Vitals:   12/22/14 0903  BP: 146/74  Pulse: 65  Temp: 36.3 C  Resp: 16    Complications: No apparent anesthesia complications

## 2014-12-22 NOTE — Anesthesia Postprocedure Evaluation (Signed)
  Anesthesia Post-op Note  Patient: Mark [name]  Procedure(s) Performed: Procedure(s): OPEN RIGHT INGUINAL HERNIA REPAIR WITH MESH (Right) INSERTION OF MESH (N/A)  Patient Location: PACU  Anesthesia Type:General  Level of Consciousness: awake  Airway and Oxygen Therapy: Patient Spontanous Breathing  Post-op Pain: mild  Post-op Assessment: Post-op Vital signs reviewed  Post-op Vital Signs: Reviewed  Last Vitals:  Filed Vitals:   12/22/14 1338  BP: 108/64  Pulse: 66  Temp: 36.3 C  Resp: 15    Complications: No apparent anesthesia complications

## 2014-12-23 ENCOUNTER — Encounter (HOSPITAL_COMMUNITY): Payer: Self-pay | Admitting: General Surgery

## 2014-12-24 ENCOUNTER — Inpatient Hospital Stay (HOSPITAL_COMMUNITY)
Admission: EM | Admit: 2014-12-24 | Discharge: 2014-12-26 | DRG: 987 | Disposition: A | Discharge status: Home / Self Care | Payer: Medicare Other | Attending: Internal Medicine | Admitting: Internal Medicine

## 2014-12-24 ENCOUNTER — Emergency Department (HOSPITAL_COMMUNITY): Payer: Medicare Other

## 2014-12-24 ENCOUNTER — Encounter (HOSPITAL_COMMUNITY): Payer: Self-pay | Admitting: *Deleted

## 2014-12-24 ENCOUNTER — Encounter: Payer: Self-pay | Admitting: Family Medicine

## 2014-12-24 DIAGNOSIS — J441 Chronic obstructive pulmonary disease with (acute) exacerbation: Secondary | ICD-10-CM

## 2014-12-24 DIAGNOSIS — I509 Heart failure, unspecified: Secondary | ICD-10-CM | POA: Insufficient documentation

## 2014-12-24 DIAGNOSIS — J9601 Acute respiratory failure with hypoxia: Secondary | ICD-10-CM | POA: Insufficient documentation

## 2014-12-24 DIAGNOSIS — M1990 Unspecified osteoarthritis, unspecified site: Secondary | ICD-10-CM | POA: Diagnosis present

## 2014-12-24 DIAGNOSIS — Z888 Allergy status to other drugs, medicaments and biological substances status: Secondary | ICD-10-CM | POA: Diagnosis not present

## 2014-12-24 DIAGNOSIS — I714 Abdominal aortic aneurysm, without rupture: Secondary | ICD-10-CM | POA: Diagnosis present

## 2014-12-24 DIAGNOSIS — I129 Hypertensive chronic kidney disease with stage 1 through stage 4 chronic kidney disease, or unspecified chronic kidney disease: Secondary | ICD-10-CM | POA: Diagnosis present

## 2014-12-24 DIAGNOSIS — Z7982 Long term (current) use of aspirin: Secondary | ICD-10-CM | POA: Diagnosis not present

## 2014-12-24 DIAGNOSIS — N184 Chronic kidney disease, stage 4 (severe): Secondary | ICD-10-CM

## 2014-12-24 DIAGNOSIS — E785 Hyperlipidemia, unspecified: Secondary | ICD-10-CM | POA: Diagnosis present

## 2014-12-24 DIAGNOSIS — E1121 Type 2 diabetes mellitus with diabetic nephropathy: Secondary | ICD-10-CM | POA: Diagnosis present

## 2014-12-24 DIAGNOSIS — J471 Bronchiectasis with (acute) exacerbation: Secondary | ICD-10-CM | POA: Insufficient documentation

## 2014-12-24 DIAGNOSIS — I482 Chronic atrial fibrillation: Secondary | ICD-10-CM | POA: Diagnosis present

## 2014-12-24 DIAGNOSIS — R0602 Shortness of breath: Secondary | ICD-10-CM | POA: Insufficient documentation

## 2014-12-24 DIAGNOSIS — Z7901 Long term (current) use of anticoagulants: Secondary | ICD-10-CM | POA: Diagnosis not present

## 2014-12-24 DIAGNOSIS — K219 Gastro-esophageal reflux disease without esophagitis: Secondary | ICD-10-CM | POA: Diagnosis present

## 2014-12-24 DIAGNOSIS — Z87891 Personal history of nicotine dependence: Secondary | ICD-10-CM | POA: Diagnosis not present

## 2014-12-24 DIAGNOSIS — I5033 Acute on chronic diastolic (congestive) heart failure: Secondary | ICD-10-CM | POA: Diagnosis present

## 2014-12-24 DIAGNOSIS — N400 Enlarged prostate without lower urinary tract symptoms: Secondary | ICD-10-CM | POA: Diagnosis present

## 2014-12-24 DIAGNOSIS — K4090 Unilateral inguinal hernia, without obstruction or gangrene, not specified as recurrent: Secondary | ICD-10-CM | POA: Diagnosis present

## 2014-12-24 DIAGNOSIS — Z8679 Personal history of other diseases of the circulatory system: Secondary | ICD-10-CM | POA: Diagnosis not present

## 2014-12-24 LAB — BASIC METABOLIC PANEL
Anion gap: 7 (ref 5–15)
BUN: 24 mg/dL — ABNORMAL HIGH (ref 6–23)
CO2: 25 mmol/L (ref 19–32)
Calcium: 9 mg/dL (ref 8.4–10.5)
Chloride: 102 mmol/L (ref 96–112)
Creatinine, Ser: 2.22 mg/dL — ABNORMAL HIGH (ref 0.50–1.35)
GFR, EST AFRICAN AMERICAN: 33 mL/min — AB (ref >90)
GFR, EST NON AFRICAN AMERICAN: 28 mL/min — AB (ref >90)
GLUCOSE: 126 mg/dL — AB (ref 70–99)
Potassium: 4.7 mmol/L (ref 3.5–5.1)
SODIUM: 134 mmol/L — AB (ref 135–145)

## 2014-12-24 LAB — CBC WITH DIFFERENTIAL/PLATELET
BASOS ABS: 0 10*3/uL (ref 0.0–0.1)
BASOS PCT: 0 % (ref 0–1)
EOS ABS: 0.1 10*3/uL (ref 0.0–0.7)
Eosinophils Relative: 1 % (ref 0–5)
HCT: 40.2 % (ref 39.0–52.0)
Hemoglobin: 13 g/dL (ref 13.0–17.0)
LYMPHS PCT: 11 % — AB (ref 12–46)
Lymphs Abs: 1.2 10*3/uL (ref 0.7–4.0)
MCH: 30.1 pg (ref 26.0–34.0)
MCHC: 32.3 g/dL (ref 30.0–36.0)
MCV: 93.1 fL (ref 78.0–100.0)
MONO ABS: 0.8 10*3/uL (ref 0.1–1.0)
Monocytes Relative: 7 % (ref 3–12)
Neutro Abs: 9.6 10*3/uL — ABNORMAL HIGH (ref 1.7–7.7)
Neutrophils Relative %: 81 % — ABNORMAL HIGH (ref 43–77)
PLATELETS: 170 10*3/uL (ref 150–400)
RBC: 4.32 MIL/uL (ref 4.22–5.81)
RDW: 17.1 % — AB (ref 11.5–15.5)
WBC: 11.8 10*3/uL — ABNORMAL HIGH (ref 4.0–10.5)

## 2014-12-24 LAB — BRAIN NATRIURETIC PEPTIDE: B Natriuretic Peptide: 854.1 pg/mL — ABNORMAL HIGH (ref 0.0–100.0)

## 2014-12-24 LAB — TROPONIN I: Troponin I: <0.03 ng/mL (ref <0.031)

## 2014-12-24 MED ORDER — GUAIFENESIN ER 600 MG PO TB12
600.0000 mg | ORAL_TABLET | Freq: Two times a day (BID) | ORAL | Status: DC
  Administered 2014-12-24 – 2014-12-26 (×4): 600 mg via ORAL
  Filled 2014-12-24 (×5): qty 1

## 2014-12-24 MED ORDER — ALBUTEROL SULFATE (2.5 MG/3ML) 0.083% IN NEBU
2.5000 mg | INHALATION_SOLUTION | Freq: Four times a day (QID) | RESPIRATORY_TRACT | Status: DC
  Administered 2014-12-24 – 2014-12-25 (×2): 2.5 mg via RESPIRATORY_TRACT
  Filled 2014-12-24 (×3): qty 3

## 2014-12-24 MED ORDER — LEVOFLOXACIN IN D5W 750 MG/150ML IV SOLN
750.0000 mg | INTRAVENOUS | Status: DC
  Administered 2014-12-24: 750 mg via INTRAVENOUS
  Filled 2014-12-24: qty 150

## 2014-12-24 MED ORDER — LEVOFLOXACIN IN D5W 750 MG/150ML IV SOLN: 750.0000 mg | INTRAVENOUS | Status: DC

## 2014-12-24 MED ORDER — ZOLPIDEM TARTRATE 5 MG PO TABS: 5.0000 mg | ORAL_TABLET | Freq: Every evening | ORAL | Status: DC | PRN

## 2014-12-24 MED ORDER — ASPIRIN 325 MG PO TABS
325.0000 mg | ORAL_TABLET | Freq: Every day | ORAL | Status: DC
  Administered 2014-12-24 – 2014-12-26 (×3): 325 mg via ORAL
  Filled 2014-12-24 (×3): qty 1

## 2014-12-24 MED ORDER — FAMOTIDINE 10 MG PO TABS
10.0000 mg | ORAL_TABLET | Freq: Every day | ORAL | Status: DC
Start: 2014-12-24 — End: 2014-12-26
  Administered 2014-12-24 – 2014-12-26 (×3): 10 mg via ORAL
  Filled 2014-12-24 (×3): qty 1

## 2014-12-24 MED ORDER — SODIUM CHLORIDE 0.9 % IJ SOLN
3.0000 mL | Freq: Two times a day (BID) | INTRAMUSCULAR | Status: DC
  Administered 2014-12-24 – 2014-12-26 (×4): 3 mL via INTRAVENOUS

## 2014-12-24 MED ORDER — TAMSULOSIN HCL 0.4 MG PO CAPS
0.4000 mg | ORAL_CAPSULE | Freq: Every day | ORAL | Status: DC
  Administered 2014-12-24 – 2014-12-26 (×3): 0.4 mg via ORAL
  Filled 2014-12-24 (×3): qty 1

## 2014-12-24 MED ORDER — ALBUTEROL SULFATE (2.5 MG/3ML) 0.083% IN NEBU: 2.5000 mg | INHALATION_SOLUTION | RESPIRATORY_TRACT | Status: DC | PRN

## 2014-12-24 MED ORDER — DILTIAZEM HCL ER BEADS 240 MG PO CP24
360.0000 mg | ORAL_CAPSULE | Freq: Every morning | ORAL | Status: DC
  Administered 2014-12-25 – 2014-12-26 (×2): 360 mg via ORAL
  Filled 2014-12-24 (×2): qty 1

## 2014-12-24 MED ORDER — METHYLPREDNISOLONE SODIUM SUCC 125 MG IJ SOLR
60.0000 mg | Freq: Four times a day (QID) | INTRAMUSCULAR | Status: DC
  Administered 2014-12-24: 60 mg via INTRAVENOUS
  Filled 2014-12-24 (×5): qty 0.96

## 2014-12-24 MED ORDER — TIOTROPIUM BROMIDE MONOHYDRATE 18 MCG IN CAPS
18.0000 ug | ORAL_CAPSULE | Freq: Every day | RESPIRATORY_TRACT | Status: DC
  Administered 2014-12-26: 18 ug via RESPIRATORY_TRACT
  Filled 2014-12-24: qty 5

## 2014-12-24 MED ORDER — OXYCODONE-ACETAMINOPHEN 5-325 MG PO TABS: 1.0000 | ORAL_TABLET | ORAL | Status: DC | PRN

## 2014-12-24 MED ORDER — AMIODARONE HCL 200 MG PO TABS
200.0000 mg | ORAL_TABLET | Freq: Every day | ORAL | Status: DC
  Administered 2014-12-25 – 2014-12-26 (×2): 200 mg via ORAL
  Filled 2014-12-24 (×2): qty 1

## 2014-12-24 MED ORDER — SODIUM CHLORIDE 0.9 % IV SOLN
250.0000 mL | INTRAVENOUS | Status: DC | PRN
  Administered 2014-12-24: 250 mL via INTRAVENOUS

## 2014-12-24 MED ORDER — LINAGLIPTIN 5 MG PO TABS
5.0000 mg | ORAL_TABLET | Freq: Every day | ORAL | Status: DC
  Administered 2014-12-25 – 2014-12-26 (×2): 5 mg via ORAL
  Filled 2014-12-24 (×2): qty 1

## 2014-12-24 MED ORDER — ACETAMINOPHEN 650 MG RE SUPP: 650.0000 mg | Freq: Four times a day (QID) | RECTAL | Status: DC | PRN

## 2014-12-24 MED ORDER — METOPROLOL SUCCINATE ER 50 MG PO TB24
75.0000 mg | ORAL_TABLET | Freq: Every day | ORAL | Status: DC
  Administered 2014-12-25 – 2014-12-26 (×2): 75 mg via ORAL
  Filled 2014-12-24 (×2): qty 1

## 2014-12-24 MED ORDER — HYDROCODONE-ACETAMINOPHEN 5-325 MG PO TABS: 1.0000 | ORAL_TABLET | ORAL | Status: DC | PRN

## 2014-12-24 MED ORDER — BUDESONIDE-FORMOTEROL FUMARATE 160-4.5 MCG/ACT IN AERO
2.0000 | INHALATION_SPRAY | Freq: Two times a day (BID) | RESPIRATORY_TRACT | Status: DC
  Filled 2014-12-24: qty 6

## 2014-12-24 MED ORDER — FUROSEMIDE 10 MG/ML IJ SOLN
40.0000 mg | Freq: Two times a day (BID) | INTRAMUSCULAR | Status: DC
  Filled 2014-12-24 (×2): qty 4

## 2014-12-24 MED ORDER — ONDANSETRON HCL 4 MG/2ML IJ SOLN: 4.0000 mg | Freq: Four times a day (QID) | INTRAMUSCULAR | Status: DC | PRN

## 2014-12-24 MED ORDER — ONDANSETRON HCL 4 MG PO TABS: 4.0000 mg | ORAL_TABLET | Freq: Four times a day (QID) | ORAL | Status: DC | PRN

## 2014-12-24 MED ORDER — FUROSEMIDE 10 MG/ML IJ SOLN
40.0000 mg | Freq: Once | INTRAMUSCULAR | Status: AC
  Administered 2014-12-24: 40 mg via INTRAVENOUS
  Filled 2014-12-24: qty 4

## 2014-12-24 MED ORDER — MORPHINE SULFATE 2 MG/ML IJ SOLN: 1.0000 mg | INTRAMUSCULAR | Status: DC | PRN

## 2014-12-24 MED ORDER — ALBUTEROL SULFATE HFA 108 (90 BASE) MCG/ACT IN AERS: 2.0000 | INHALATION_SPRAY | Freq: Four times a day (QID) | RESPIRATORY_TRACT | Status: DC | PRN

## 2014-12-24 MED ORDER — ACETAMINOPHEN 325 MG PO TABS: 650.0000 mg | ORAL_TABLET | Freq: Four times a day (QID) | ORAL | Status: DC | PRN

## 2014-12-24 MED ORDER — SODIUM CHLORIDE 0.9 % IJ SOLN: 3.0000 mL | INTRAMUSCULAR | Status: DC | PRN

## 2014-12-24 MED ORDER — ENOXAPARIN SODIUM 30 MG/0.3ML ~~LOC~~ SOLN
30.0000 mg | Freq: Every day | SUBCUTANEOUS | Status: DC
  Administered 2014-12-24 – 2014-12-25 (×2): 30 mg via SUBCUTANEOUS
  Filled 2014-12-24 (×3): qty 0.3

## 2014-12-24 MED ORDER — ATORVASTATIN CALCIUM 10 MG PO TABS
10.0000 mg | ORAL_TABLET | Freq: Every day | ORAL | Status: DC
  Administered 2014-12-25 – 2014-12-26 (×2): 10 mg via ORAL
  Filled 2014-12-24 (×2): qty 1

## 2014-12-24 NOTE — H&P (Addendum)
[facility]                                                                                    Patient Demographics  Mark [name], is a 69 y.o. male  CSN: [id number]  MRN: [id number]  DOB - Jan 25, 1946  Admit Date - 12/24/2014  Outpatient Primary MD for the patient is Leo Grosser, MD   With History of -  Past Medical History  Diagnosis Date  . GERD (gastroesophageal reflux disease)   . Hyperlipidemia   . H/O hiatal hernia   . Hypertension     Sees Dr. Lynnea Ferrier  . AAA (abdominal aortic aneurysm)     a. 09/2013: Resection and grafting of abdominal aortic aneurysm with insertion of an aorto by common iliac graft using 18 x 9 mm Hemashield Dacron graft.  Marland Kitchen COPD (chronic obstructive pulmonary disease)   . Chronic diastolic CHF (congestive heart failure)     a.  Echo (08/13/2013): EF 55-60%, normal wall motion, mild MR, mild LAE  . Pneumonia   . Arthritis     "little in my hands" (09/08/2013)  . Chronic kidney disease (CKD), stage III (moderate)   . PAF (paroxysmal atrial fibrillation)     a. amiodarone Rx started 08/2013;  b. s/p TEE-DCCV 09/2013 => recurrent AFib => repeat DCCV => NSR;   c. Xarelto 15 QD due to CKD  . Hx of cardiovascular stress test     a. Myoview 09/2012:  no scar or ischemia  . Diabetes mellitus without complication     TYPE 2      Past Surgical History  Procedure Laterality Date  . Knee arthroscopy Right   . Abdominal aortic aneurysm repair  10/15/2012    Procedure: ANEURYSM ABDOMINAL AORTIC REPAIR;  Surgeon: Pryor Ochoa, MD;  Location: [facility] OR;  Service: Vascular;  Laterality: N/A;  Resection and Grafting of Abdominal Aortic Aneurysm - Aortobi-Iliac   . Tee without cardioversion N/A 09/22/2013    Procedure: TRANSESOPHAGEAL ECHOCARDIOGRAM (TEE);  Surgeon: Lars Masson, MD;  Location: [facility] ENDOSCOPY;  Service: Cardiovascular;  Laterality: N/A;  . Cardioversion N/A 09/22/2013    Procedure: CARDIOVERSION;  Surgeon: Lars Masson, MD;  Location: [facility]COPY;  Service: Cardiovascular;  Laterality: N/A;  . Abdominal aortic aneurysm repair  10-15-2012  . Cardioversion  Jan. 12, 2015    Dr. Doylene Canning. Ladona Ridgel  . Cardioversion N/A 09/28/2013    Procedure: CARDIOVERSION;  Surgeon: Marinus Maw, MD;  Location: [facility] CATH LAB;  Service: Cardiovascular;  Laterality: N/A;  . Inguinal hernia repair Right 12/22/2014    Procedure: OPEN RIGHT INGUINAL HERNIA REPAIR WITH MESH;  Surgeon: Axel Filler, MD;  Location: [facility] ORS;  Service: General;  Laterality: Right;  . Insertion of mesh N/A 12/22/2014    Procedure: INSERTION OF MESH;  Surgeon: Axel Filler, MD;  Location: [facility] ORS;  Service: General;  Laterality: N/A;    in for   Chief Complaint  Patient presents with  . Shortness of Breath     HPI  Mark [name]  is a 69 y.o. male, with past medical history significant for hypertension, diabetes, hyperlipidemia , COPD and congestive  heart failure presenting today with shortness of breath and hypoxemia. The patient has an oxygen monitor at home and he noted that his pulse ox was in the 60s. Patient is not on home oxygen. The patient had a recent surgery for right inguinal hernia around 2 days ago and was not intubated. No report of fever chills but the patient reports a productive cough with creamy sputum. Chest x-ray shows mild congestive heart failure. No chest pains, nausea vomiting or dizziness. Patient had an echo 2015 which showed an ejection fraction of 60%. Was treated for COPD with steroids and Zithromax earlier    Review of Systems    In addition to the HPI above,  No Fever-chills, No Headache, No changes with Vision or hearing, No problems swallowing food or Liquids, No Chest pain,  No Abdominal pain, No Nausea or Vommitting, Bowel movements are regular, No Blood in stool or Urine, No dysuria, No new skin rashes or bruises, No new joints pains-aches,  No new weakness, tingling, numbness in any extremity, No  recent weight gain or loss, No polyuria, polydypsia or polyphagia, No significant Mental Stressors.  A full 10 point Review of Systems was done, except as stated above, all other Review of Systems were negative.   Social History History  Substance Use Topics  . Smoking status: Former Smoker -- 1.00 packs/day for 56 years    Types: Cigarettes    Quit date: 07/31/2013  . Smokeless tobacco: Former NeurosurgeonUser    Types: Chew     Comment: 09/08/2013 "stopped smoking cigarettes ~ 2 months ago; aien't chew in probably 10 yrs"  . Alcohol Use: No     Family History Family History  Problem Relation Age of Onset  . Diabetes Mother   . Tuberculosis Mother   . Heart disease Father   . Heart attack Father   . Heart disease Son     Blood clot:  Arm     Prior to Admission medications   Medication Sig Start Date End Date Taking? Authorizing Provider  albuterol (VENTOLIN HFA) 108 (90 BASE) MCG/ACT inhaler Inhale 2 puffs into the lungs every 6 (six) hours as needed for wheezing or shortness of breath. 06/15/14  Yes Donita BrooksWarren T Pickard, MD  amiodarone (PACERONE) 200 MG tablet TAKE 1 TABLET (200 MG TOTAL) BY MOUTH DAILY. 11/22/14  Yes Donita BrooksWarren T Pickard, MD  aspirin 325 MG tablet Take 325 mg by mouth daily.   Yes Historical Provider, MD  atorvastatin (LIPITOR) 10 MG tablet Take 1 tablet (10 mg total) by mouth daily. 09/13/14  Yes Dayna N Dunn, PA-C  budesonide-formoterol (SYMBICORT) 160-4.5 MCG/ACT inhaler Inhale 2 puffs into the lungs 2 (two) times daily. 02/24/14  Yes Donita BrooksWarren T Pickard, MD  diltiazem (TAZTIA XT) 360 MG 24 hr capsule Take 360 mg by mouth every morning.    Yes Historical Provider, MD  furosemide (LASIX) 40 MG tablet Take 1 tablet (40 mg total) by mouth daily. 09/08/14  Yes Ok Anishristopher R Berge, NP  linagliptin (TRADJENTA) 5 MG TABS tablet Take 1 tablet (5 mg total) by mouth daily. 02/24/14  Yes Donita BrooksWarren T Pickard, MD  metoprolol succinate (TOPROL-XL) 25 MG 24 hr tablet TAKE 3 TABLETS BY MOUTH EVERY  DAY TAKE WITH OR IMMEDIATELY AFTER BREAKFAST 11/29/14  Yes Donita BrooksWarren T Pickard, MD  oxyCODONE-acetaminophen (ROXICET) 5-325 MG per tablet Take 1-2 tablets by mouth every 4 (four) hours as needed for severe pain. 12/22/14  Yes Axel FillerArmando Ramirez, MD  ranitidine (ZANTAC) 150 MG tablet Take 150  mg by mouth daily as needed for heartburn.   Yes Historical Provider, MD  tamsulosin (FLOMAX) 0.4 MG CAPS capsule Take 1 capsule (0.4 mg total) by mouth daily. Patient taking differently: Take 0.4 mg by mouth at bedtime.  10/29/14  Yes Donita Brooks, MD  tiotropium (SPIRIVA HANDIHALER) 18 MCG inhalation capsule Place 1 capsule (18 mcg total) into inhaler and inhale daily. 02/24/14  Yes Donita Brooks, MD  predniSONE (DELTASONE) 20 MG tablet Take 2 tablets (40 mg total) by mouth daily with breakfast. Patient not taking: Reported on 12/24/2014 12/09/14   Donita Brooks, MD    Allergies  Allergen Reactions  . Niaspan [Niacin Er] Anaphylaxis    Physical Exam  Vitals  Blood pressure 129/69, pulse 71, temperature 97.2 F (36.2 C), resp. rate 16, height  (1.676 m), weight 63.674 kg (140 lb 6 oz), SpO2 92 %.   1. General elderly white male, extremely pleasant well-developed and well-nourished  2. Normal affect and insight, Not Suicidal or Homicidal, Awake Alert, Oriented X 3.  3. No F.N deficits, grossly.  4. Ears and Eyes appear Normal, Conjunctivae clear, PERRLA. Moist Oral Mucosa.  5. Supple Neck,+JVD, No cervical lymphadenopathy appriciated, No Carotid Bruits.  6. Symmetrical Chest wall movement, decreased breath sounds especially at the bases.  7. RRR, No Gallops, Rubs or Murmurs, No Parasternal Heave.  8. Positive Bowel Sounds, Abdomen Soft, Non tender, No organomegaly appriciated,No rebound -guarding or rigidity.  9.  No Cyanosis, Normal Skin Turgor, No Skin Rash or Bruise, 1+ pitting edema in the lower extremities noted.  10. Good muscle tone,  joints appear normal , no effusions, Normal  ROM.    Data Review  CBC  Recent Labs Lab 12/24/14 1745  WBC 11.8*  HGB 13.0  HCT 40.2  PLT 170  MCV 93.1  MCH 30.1  MCHC 32.3  RDW 17.1*  LYMPHSABS 1.2  MONOABS 0.8  EOSABS 0.1  BASOSABS 0.0   ------------------------------------------------------------------------------------------------------------------  Chemistries   Recent Labs Lab 12/24/14 1745  NA 134*  K 4.7  CL 102  CO2 25  GLUCOSE 126*  BUN 24*  CREATININE 2.22*  CALCIUM 9.0   ------------------------------------------------------------------------------------------------------------------ estimated creatinine clearance is 27.9 mL/min (by C-G formula based on Cr of 2.22). ------------------------------------------------------------------------------------------------------------------ No results for input(s): TSH, T4TOTAL, T3FREE, THYROIDAB in the last 72 hours.  Invalid input(s): FREET3   Coagulation profile No results for input(s): INR, PROTIME in the last 168 hours. ------------------------------------------------------------------------------------------------------------------- No results for input(s): DDIMER in the last 72 hours. -------------------------------------------------------------------------------------------------------------------  Cardiac Enzymes  Recent Labs Lab 12/24/14 1745  TROPONINI <0.03   ------------------------------------------------------------------------------------------------------------------ Invalid input(s): POCBNP   ---------------------------------------------------------------------------------------------------------------  Urinalysis    Component Value Date/Time   COLORURINE YELLOW 10/07/2014 1932   APPEARANCEUR CLOUDY* 10/07/2014 1932   LABSPEC 1.010 10/07/2014 1932   PHURINE 6.0 10/07/2014 1932   GLUCOSEU NEGATIVE 10/07/2014 1932   HGBUR NEGATIVE 10/07/2014 1932   BILIRUBINUR NEGATIVE 10/07/2014 1932   KETONESUR NEGATIVE 10/07/2014  1932   PROTEINUR NEGATIVE 10/07/2014 1932   UROBILINOGEN 0.2 10/07/2014 1932   NITRITE NEGATIVE 10/07/2014 1932   LEUKOCYTESUR NEGATIVE 10/07/2014 1932    ----------------------------------------------------------------------------------------------------------------   Imaging results:   Dg Chest 2 View  12/24/2014   CLINICAL DATA:  Shortness of breath, COPD  EXAM: CHEST  2 VIEW  COMPARISON:  12/16/2014  FINDINGS: Cardiomediastinal silhouette is stable. Again noted bilateral reticular interstitial prominence with slight worsening from prior exam especially in lower lobes. Findings suspicious for mild edema or pneumonitis superimposed  on chronic interstitial lung disease. Question bilateral small pleural effusion or basilar infiltrate.  IMPRESSION: Again noted bilateral reticular interstitial prominence with slight worsening from prior exam especially in lower lobes. Findings suspicious for mild edema or pneumonitis superimposed on chronic interstitial lung disease. Question bilateral small pleural effusion or basilar infiltrate.   Electronically Signed   By: Natasha Mead M.D.   On: 12/24/2014 18:41   Dg Chest 2 View  12/16/2014   CLINICAL DATA:  Preoperative examination prior to inguinal hernia repair next week; recent episodes of shortness of breath and cough with chest congestion ; history of COPD and diabetes and long-term tobacco use now discontinued  EXAM: CHEST  2 VIEW  COMPARISON:  PA and lateral chest x-ray of October 07, 2014  FINDINGS: The lungs are well-expanded. The interstitial markings are coarse with areas of patchy confluent density in the right upper and lower and left lower lobes. There is no significant pleural effusion. The cardiac silhouette is mildly enlarged. The pulmonary vascularity is prominent centrally but there is no definite cephalization. There is mild tortuosity of the descending thoracic aorta.  IMPRESSION: Increase conspicuity of patchy densities in both lungs suggests  subsegmental atelectasis or mild infiltrate. Parenchymal nodules may be present. Low-grade compensated CHF is likely as well.  Chest CT scanning is recommended to further evaluate the pulmonary parenchyma.   Electronically Signed   By: David  [place]   On: 12/16/2014 12:52    My personal review of EKG: Normal sinus rhythm at 64 bpm, no acute changes    Assessment & Plan  1. Hypoxemia; congestive heart failure versus pneumonitis 2. Congestive heart failure by chest x-ray 3. History of COPD and decreased breath sounds on physical exam 4. Paroxysmal atrial fib not on treatment anymore 5. Diabetes mellitus type 2 6. Chronic renal failure  Plan  Place on oxygen Nebulizer treatments IV steroids Change Lasix to IV IV antibiotics/Levaquin Chest CT to rule out PE was canceled due to renal failure. Consider  restarting anticoagulation with  patient is ready after surgery  DVT Prophylaxis Lovenox  AM Labs Ordered, also please review Full Orders  Family Communication: Admission, patients condition and plan of care including tests being ordered have been discussed with the patient and wife who indicate understanding and agree with the plan and Code Status.  Code Status full code  Disposition Plan:   Time spent in minutes : 42 minutes  Condition GUARDED   @

## 2014-12-24 NOTE — ED Notes (Signed)
The pt is c/o sob since this am.  The pt has copd.  He had hernia surgery  This past Wednesday.  Coughing productive blood tiinged sputum.  Pain in his hernia site

## 2014-12-24 NOTE — ED Provider Notes (Signed)
CSN: [id number]     Arrival date & time 12/24/14  1718 History   First MD Initiated Contact with Patient 12/24/14 1753     Chief Complaint  Patient presents with  . Shortness of Breath     (Consider location/radiation/quality/duration/timing/severity/associated sxs/prior Treatment) HPI Comments: Mark [name] is a 69 y.o. male with a PMHx of HTN, HLD, AAA s/p repair, COPD, chronic diastolic CHF, paroxysmal Afib, GERD, hiatal hernia, arthritis, and DM2, who presents to the ED with complaints of shortness of breath that began this morning when he awoke. He had R inguinal hernia repair surgery on Wednesday with MAC and local anesthesia, was not intubated. He reports that this morning he checked his oxygen level at home and it was in the 60s, he used his inhaler and coughed which made his oxygen percentage, up to the 80s. He reports that he is typically about 94% at home on room air, and does not require oxygen at home. He states that 2 times he had pinkish sputum production when he coughed. Shortness breath improved slightly with albuterol and has improved since having oxygen placed on him. He states he has had a 1 pound wt increase in 5 days. He is taking his Lasix appropriately, and states that his legs are at their baseline with swelling. He also reports his abdomen is sore where he had his surgery, and also states he feels like it's "got some fluid" in it. He denies any fevers, chills, chest pain, wheezing, worsening leg swelling, PND, worsening orthopnea, claudication, abdominal pain, nausea, vomiting, diarrhea, constipation, melena, hematochezia, dysuria, hematuria, rashes, myalgias, arthralgias, numbness, tingling, or weakness. He is currently not taking xarelto at the recommendations of his PCP/surgeons, and is instead taking ASA .  Chart review reveals that he was treated for a COPD exacerbation, with prednisone and azithromycin ending on 12/17/14. He states he was back to his usual state of  health prior to the surgery.  Patient is a 68 y.o. male presenting with shortness of breath. The history is provided by the patient. No language interpreter was used.  Shortness of Breath Severity:  Mild Onset quality:  Gradual Duration:  10 hours Timing:  Constant Progression:  Unchanged Chronicity:  Recurrent Context comment:  Recent surgery Relieved by:  Inhaler and oxygen Worsened by:  Nothing tried Ineffective treatments:  None tried Associated symptoms: abdominal pain (over hernia repair site), cough (pinkish sputum), hemoptysis (pinkish sputum) and sputum production (pinkish)   Associated symptoms: no chest pain, no claudication, no diaphoresis, no fever, no PND, no rash, no sore throat, no syncope, no vomiting and no wheezing   Risk factors: recent surgery   Risk factors: no hx of PE/DVT     Past Medical History  Diagnosis Date  . GERD (gastroesophageal reflux disease)   . Hyperlipidemia   . H/O hiatal hernia   . Hypertension     Sees Dr. Lynnea Ferrier  . AAA (abdominal aortic aneurysm)     a. 09/2013: Resection and grafting of abdominal aortic aneurysm with insertion of an aorto by common iliac graft using 18 x 9 mm Hemashield Dacron graft.  Marland Kitchen COPD (chronic obstructive pulmonary disease)   . Chronic diastolic CHF (congestive heart failure)     a.  Echo (08/13/2013): EF 55-60%, normal wall motion, mild MR, mild LAE  . Pneumonia   . Arthritis     "little in my hands" (09/08/2013)  . Chronic kidney disease (CKD), stage III (moderate)   . PAF (paroxysmal atrial fibrillation)  a. amiodarone Rx started 08/2013;  b. s/p TEE-DCCV 09/2013 => recurrent AFib => repeat DCCV => NSR;   c. Xarelto 15 QD due to CKD  . Hx of cardiovascular stress test     a. Myoview 09/2012:  no scar or ischemia  . 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 Knee arthroscopy Right   . Abdominal aortic aneurysm repair  10/15/2012     Procedure: ANEURYSM ABDOMINAL AORTIC REPAIR;  Surgeon: Pryor OchoaJames D Lawson, MD;  [facility];  Service: Vascular;  Laterality: N/A;  Resection and Grafting of Abdominal Aortic Aneurysm - Aortobi-Iliac   . Tee without cardioversion N/A 09/22/2013    Procedure: TRANSESOPHAGEAL ECHOCARDIOGRAM (TEE);  Surgeon: Lars MassonKatarina H Nelson, MD;  [facility];  Service: Cardiovascular;  Laterality: N/A;  . Cardioversion N/A 09/22/2013    Procedure: CARDIOVERSION;  Surgeon: Lars MassonKatarina H Nelson, MD;  [facility];  Service: Cardiovascular;  Laterality: N/A;  . Abdominal aortic aneurysm repair  10-15-2012  . Cardioversion  Jan. 12, 2015    Dr. Doylene CanningGregg W. Ladona Ridgelaylor  . Cardioversion N/A 09/28/2013    Procedure: CARDIOVERSION;  Surgeon: Marinus MawGregg W Taylor, MD;  [facility];  Service: Cardiovascular;  Laterality: N/A;  . Inguinal hernia repair Right 12/22/2014    Procedure: OPEN RIGHT INGUINAL HERNIA REPAIR WITH MESH;  Surgeon: Axel FillerArmando Ramirez, MD;  Location: [facility] ORS;  Service: General;  Laterality: Right;  . Insertion of mesh N/A 12/22/2014    Procedure: INSERTION OF MESH;  Surgeon: Axel FillerArmando Ramirez, MD;  Location: [facility] ORS;  Service: General;  Laterality: N/A;   Family History  Problem Relation Age of Onset  . Diabetes Mother   . Tuberculosis Mother   . Heart disease Father   . Heart attack Father   . Heart disease Son     Blood clot:  Arm   History  Substance Use Topics  . Smoking status: Former Smoker -- 1.00 packs/day for 56 years    Types: Cigarettes    Quit date: 07/31/2013  . Smokeless tobacco: Former NeurosurgeonUser    Types: Chew     Comment: 09/08/2013 "stopped smoking cigarettes ~ 2 months ago; aien't chew in probably 10 yrs"  . Alcohol Use: No    Review of Systems  Constitutional: Positive for unexpected weight change (1 lb in 5 days). Negative for fever, chills and diaphoresis.  HENT: Negative for sore throat.   Respiratory: Positive for cough (pinkish sputum), hemoptysis (pinkish sputum),  sputum production (pinkish) and shortness of breath. Negative for wheezing.   Cardiovascular: Positive for leg swelling (mild, chronic). Negative for chest pain, claudication, syncope and PND.  Gastrointestinal: Positive for abdominal pain (over hernia repair site). Negative for nausea, vomiting, diarrhea and constipation.  Genitourinary: Negative for dysuria, hematuria and flank pain.  Musculoskeletal: Negative for myalgias, joint swelling and arthralgias.  Skin: Negative for color change and rash.  Neurological: Negative for weakness, light-headedness and numbness.  Hematological: Bruises/bleeds easily (on ASA 325mg ).  Psychiatric/Behavioral: Negative for confusion.   10 Systems reviewed and are negative for acute change except as noted in the HPI.    Allergies  Niaspan  Home Medications   Prior to Admission medications   Medication Sig Start Date End Date Taking? Authorizing Provider  albuterol (VENTOLIN HFA) 108 (90 BASE) MCG/ACT inhaler Inhale 2 puffs into the lungs every 6 (six) hours as needed for wheezing or shortness of breath. 06/15/14   Broadus JohnWarren  Flonnie Hailstone, MD  amiodarone (PACERONE) 200 MG tablet TAKE 1 TABLET (200 MG TOTAL) BY MOUTH DAILY. 11/22/14   Donita Brooks, MD  aspirin 325 MG tablet Take 325 mg by mouth daily.    Historical Provider, MD  atorvastatin (LIPITOR) 10 MG tablet Take 1 tablet (10 mg total) by mouth daily. 09/13/14   Dayna N Dunn, PA-C  budesonide-formoterol (SYMBICORT) 160-4.5 MCG/ACT inhaler Inhale 2 puffs into the lungs 2 (two) times daily. 02/24/14   Donita Brooks, MD  diltiazem (TAZTIA XT) 360 MG 24 hr capsule Take 360 mg by mouth every morning.     Historical Provider, MD  furosemide (LASIX) 40 MG tablet Take 1 tablet (40 mg total) by mouth daily. 09/08/14   Ok Anis, NP  linagliptin (TRADJENTA) 5 MG TABS tablet Take 1 tablet (5 mg total) by mouth daily. 02/24/14   Donita Brooks, MD  metoprolol succinate (TOPROL-XL) 25 MG 24 hr tablet TAKE 3  TABLETS BY MOUTH EVERY DAY TAKE WITH OR IMMEDIATELY AFTER BREAKFAST 11/29/14   Donita Brooks, MD  oxyCODONE-acetaminophen (ROXICET) 5-325 MG per tablet Take 1-2 tablets by mouth every 4 (four) hours as needed for severe pain. 12/22/14   Axel Filler, MD  predniSONE (DELTASONE) 20 MG tablet Take 2 tablets (40 mg total) by mouth daily with breakfast. 12/09/14   Donita Brooks, MD  ranitidine (ZANTAC) 150 MG tablet Take 150 mg by mouth daily as needed for heartburn.    Historical Provider, MD  tamsulosin (FLOMAX) 0.4 MG CAPS capsule Take 1 capsule (0.4 mg total) by mouth daily. Patient taking differently: Take 0.4 mg by mouth at bedtime.  10/29/14   Donita Brooks, MD  tiotropium (SPIRIVA HANDIHALER) 18 MCG inhalation capsule Place 1 capsule (18 mcg total) into inhaler and inhale daily. 02/24/14   Donita Brooks, MD   BP 125/69 mmHg  Pulse 64  Temp(Src) 97.2 F (36.2 C)  Resp 18  Ht 5\' 6"  (1.676 m)  Wt 140 lb 6 oz (63.674 kg)  BMI 22.67 kg/m2  SpO2 98% Physical Exam  Constitutional: He is oriented to person, place, and time. Vital signs are normal. He appears well-developed and well-nourished.  Non-toxic appearance. No distress.  Afebrile, nontoxic, NAD, elderly frail appearing male  HENT:  Head: Normocephalic and atraumatic.  Mouth/Throat: Oropharynx is clear and moist and mucous membranes are normal.  Eyes: Conjunctivae and EOM are normal. Right eye exhibits no discharge. Left eye exhibits no discharge.  Neck: Normal range of motion. Neck supple. No JVD present.  No JVD noted  Cardiovascular: Normal rate, regular rhythm, normal heart sounds and intact distal pulses.  Exam reveals no gallop and no friction rub.   No murmur heard. RRR, nl s1/s2, no m/r/g, distal pulses intact, b/l pretibial 1+ pedal edema   Pulmonary/Chest: Effort normal. No accessory muscle usage. No respiratory distress. He has decreased breath sounds in the left lower field. He has no wheezes. He has no rhonchi. He  has no rales.  Mildly diminished sounds in the L lower field, no wheezes, rhonchi, or rales, no increased WOB, speaking in full sentences, SpO2 98% on 2L via [place]  Abdominal: Soft. Normal appearance and bowel sounds are normal. He exhibits no distension. There is tenderness in the right lower quadrant. There is no rigidity, no rebound, no guarding, no CVA tenderness, no tenderness at McBurney's point and negative Murphy's sign.    Soft, nondistended, +BS throughout, with mild RLQ tenderness at inguinal hernia repair  site, no r/g/r, neg murphy's, neg mcburney's, no CVA TTP   Musculoskeletal: Normal range of motion.       Right lower leg: He exhibits edema.       Left lower leg: He exhibits edema.  MAE x4 Strength and sensation grossly intact Distal pulses intact B/L pretibial 1+ pitting edema up to knees  Neurological: He is alert and oriented to person, place, and time. He has normal strength. No sensory deficit.  Skin: Skin is warm, dry and intact. No rash noted.  Psychiatric: He has a normal mood and affect.  Nursing note and vitals reviewed.   ED Course  Procedures (including critical care time) Labs Review Labs Reviewed  BASIC METABOLIC PANEL - Abnormal; Notable for the following:    Sodium 134 (*)    Glucose, Bld 126 (*)    BUN 24 (*)    Creatinine, Ser 2.22 (*)    GFR calc non Af Amer 28 (*)    GFR calc Af Amer 33 (*)    All other components within normal limits  BRAIN NATRIURETIC PEPTIDE - Abnormal; Notable for the following:    B Natriuretic Peptide 854.1 (*)    All other components within normal limits  CBC WITH DIFFERENTIAL/PLATELET - Abnormal; Notable for the following:    WBC 11.8 (*)    RDW 17.1 (*)    Neutrophils Relative % 81 (*)    Neutro Abs 9.6 (*)    Lymphocytes Relative 11 (*)    All other components within normal limits  TROPONIN I    Imaging Review Dg Chest 2 View  12/24/2014   CLINICAL DATA:  Shortness of breath, COPD  EXAM: CHEST  2 VIEW  COMPARISON:   12/16/2014  FINDINGS: Cardiomediastinal silhouette is stable. Again noted bilateral reticular interstitial prominence with slight worsening from prior exam especially in lower lobes. Findings suspicious for mild edema or pneumonitis superimposed on chronic interstitial lung disease. Question bilateral small pleural effusion or basilar infiltrate.  IMPRESSION: Again noted bilateral reticular interstitial prominence with slight worsening from prior exam especially in lower lobes. Findings suspicious for mild edema or pneumonitis superimposed on chronic interstitial lung disease. Question bilateral small pleural effusion or basilar infiltrate.   Electronically Signed   By: Natasha Mead M.D.   On: 12/24/2014 18:41     EKG Interpretation   Date/Time:  Friday December 24 2014 17:32:29 EDT Ventricular Rate:  64 PR Interval:  178 QRS Duration: 92 QT Interval:  444 QTC Calculation: 458 R Axis:   18 Text Interpretation:  Normal sinus rhythm Normal ECG Confirmed by ZAVITZ   MD, JOSHUA ([id number]) on 12/24/2014 7:40:07 PM      MDM   Final diagnoses:  SOB (shortness of breath)  Acute exacerbation of CHF (congestive heart failure)  CKD (chronic kidney disease), stage IV    69 y.o. male here with SOB that began this AM, recent surgery on 12/22/14 for inguinal hernia repair, was not intubated at that time. On exam, lung sounds clear, slightly diminished in L lower field, no wheezes or rhonchi. Mild pretibial b/l 1+ pitting edema. Not usually on home oxygen, now requiring 2L via [place] to remain at 98% SpO2. Will obtain labs, EKG, CXR. Will also obtain CT angio of chest to r/o PE.  7:12 PM Radiology calling stating that pt can't have CTA due to Cr elevation and diabetes, therefore this was cancelled. BMP showing chronically low Na at 134, BUN 24 (better than baseline), Cr 2.22 (better than previously),  CBC with diff showing WBC of 11.8 (improved from 1wk ago, could be demargination from recent prednisone, or from  surgery), Trop WNL, EKG in NSR, and BNP 854.1. CXR showing bilateral reticular interstitial prominence w/ slight worsening from prior exam, particularly in lower lobes, suspicious for mild edema or pneumonitis. Given that pt would need V/Q scan to eval for PE, and pt with likely CHF exacerbation, will proceed with admission. Will also give lasix  IV now.   7:36 PM Dr. Sharyon Medicus returning page for [facility], will admit for CHF exacerbation. Pt updated. Please see admission note for further documentation of care.  BP 133/80 mmHg  Pulse 60  Temp(Src) 97.2 F (36.2 C)  Resp 20  Ht  (1.676 m)  Wt 140 lb 6 oz (63.674 kg)  BMI 22.67 kg/m2  SpO2 94%  Meds ordered this encounter  Medications  . furosemide (LASIX) injection 40 mg    Sig:      Mark Kampf Camprubi-Soms, PA-C 12/24/14 1941  Blane Ohara, MD 12/25/14 [phone]

## 2014-12-24 NOTE — Progress Notes (Signed)
ANTIBIOTIC CONSULT NOTE - INITIAL  Pharmacy Consult for levaquin Indication: rule out pneumonia  Allergies  Allergen Reactions  . Niaspan [Niacin Er] Anaphylaxis    Patient Measurements: Height: 5\' 7"  (170.2 cm) Weight: 140 lb 12.8 oz (63.866 kg) IBW/kg (Calculated) : 66.1 Adjusted Body Weight:   Vital Signs: Temp: 98.3 F (36.8 C) (04/08 2100) Temp Source: Oral (04/08 2100) BP: 118/68 mmHg (04/08 2100) Pulse Rate: 60 (04/08 2100) Intake/Output from previous day:   Intake/Output from this shift:    Labs:  Recent Labs  12/24/14 1745  WBC 11.8*  HGB 13.0  PLT 170  CREATININE 2.22*   Estimated Creatinine Clearance: 28 mL/min (by C-G formula based on Cr of 2.22). No results for input(s): VANCOTROUGH, VANCOPEAK, VANCORANDOM, GENTTROUGH, GENTPEAK, GENTRANDOM, TOBRATROUGH, TOBRAPEAK, TOBRARND, AMIKACINPEAK, AMIKACINTROU, AMIKACIN in the last 72 hours.   Microbiology: No results found for this or any previous visit (from the past 720 hour(s)).  Medical History: Past Medical History  Diagnosis Date  . GERD (gastroesophageal reflux disease)   . Hyperlipidemia   . H/O hiatal hernia   . Hypertension     Sees Dr. Lynnea FerrierWarren Pickard  . AAA (abdominal aortic aneurysm)     a. 09/2013: Resection and grafting of abdominal aortic aneurysm with insertion of an aorto by common iliac graft using 18 x 9 mm Hemashield Dacron graft.  Marland Kitchen. COPD (chronic obstructive pulmonary disease)   . Chronic diastolic CHF (congestive heart failure)     a.  Echo (08/13/2013): EF 55-60%, normal wall motion, mild MR, mild LAE  . Pneumonia   . Arthritis     "little in my hands" (09/08/2013)  . Chronic kidney disease (CKD), stage III (moderate)   . PAF (paroxysmal atrial fibrillation)     a. amiodarone Rx started 08/2013;  b. s/p TEE-DCCV 09/2013 => recurrent AFib => repeat DCCV => NSR;   c. Xarelto 15 QD due to CKD  . Hx of cardiovascular stress test     a. Myoview 09/2012:  no scar or ischemia  .  Diabetes mellitus without complication     TYPE 2    Medications:  Scheduled:  . albuterol  2.5 mg Nebulization Q6H  . amiodarone  200 mg Oral Daily  . aspirin  325 mg Oral Daily  . atorvastatin  10 mg Oral Daily  . budesonide-formoterol  2 puff Inhalation BID  . [START ON 12/25/2014] diltiazem  360 mg Oral q morning - 10a  . enoxaparin (LOVENOX) injection  30 mg Subcutaneous Q24H  . famotidine  10 mg Oral Daily  . [START ON 12/25/2014] furosemide  40 mg Intravenous BID  . guaiFENesin  600 mg Oral BID  . levofloxacin (LEVAQUIN) IV  750 mg Intravenous Q48H  . linagliptin  5 mg Oral Daily  . methylPREDNISolone (SOLU-MEDROL) injection  60 mg Intravenous Q6H  . metoprolol succinate  75 mg Oral Daily  . sodium chloride  3 mL Intravenous Q12H  . sodium chloride  3 mL Intravenous Q12H  . tamsulosin  0.4 mg Oral Daily  . tiotropium  18 mcg Inhalation Daily   Infusions:   Assessment: 69 yo who came in with hypoxemia that could be due to PNA vs CHF. Empiric levaquin was ordered. Scr was 2.22 today. Crcl ~28. Pt also has hx afib but not on AC anymore.   Plan:   Change levaquin to 750mg  PO q48 x5 days  Ulyses SouthwardMinh Pham, PharmD Pager: [phone] 12/24/2014 9:41 PM

## 2014-12-25 ENCOUNTER — Inpatient Hospital Stay (HOSPITAL_COMMUNITY): Payer: Medicare Other

## 2014-12-25 DIAGNOSIS — J9601 Acute respiratory failure with hypoxia: Secondary | ICD-10-CM

## 2014-12-25 DIAGNOSIS — J471 Bronchiectasis with (acute) exacerbation: Secondary | ICD-10-CM

## 2014-12-25 DIAGNOSIS — I509 Heart failure, unspecified: Secondary | ICD-10-CM

## 2014-12-25 DIAGNOSIS — E1121 Type 2 diabetes mellitus with diabetic nephropathy: Secondary | ICD-10-CM

## 2014-12-25 DIAGNOSIS — J441 Chronic obstructive pulmonary disease with (acute) exacerbation: Secondary | ICD-10-CM

## 2014-12-25 DIAGNOSIS — I10 Essential (primary) hypertension: Secondary | ICD-10-CM

## 2014-12-25 LAB — BASIC METABOLIC PANEL
Anion gap: 5 (ref 5–15)
BUN: 26 mg/dL — ABNORMAL HIGH (ref 6–23)
CO2: 24 mmol/L (ref 19–32)
Calcium: 8.4 mg/dL (ref 8.4–10.5)
Chloride: 103 mmol/L (ref 96–112)
Creatinine, Ser: 2.27 mg/dL — ABNORMAL HIGH (ref 0.50–1.35)
GFR calc non Af Amer: 28 mL/min — ABNORMAL LOW (ref >90)
GFR, EST AFRICAN AMERICAN: 32 mL/min — AB (ref >90)
GLUCOSE: 113 mg/dL — AB (ref 70–99)
Potassium: 4.6 mmol/L (ref 3.5–5.1)
Sodium: 132 mmol/L — ABNORMAL LOW (ref 135–145)

## 2014-12-25 LAB — TROPONIN I
Troponin I: <0.03 ng/mL (ref <0.031)
Troponin I: <0.03 ng/mL (ref <0.031)
Troponin I: <0.03 ng/mL (ref <0.031)

## 2014-12-25 LAB — GLUCOSE, CAPILLARY
GLUCOSE-CAPILLARY: 297 mg/dL — AB (ref 70–99)
GLUCOSE-CAPILLARY: 213 mg/dL — AB (ref 70–99)
Glucose-Capillary: 185 mg/dL — ABNORMAL HIGH (ref 70–99)

## 2014-12-25 LAB — EXPECTORATED SPUTUM ASSESSMENT W REFEX TO RESP CULTURE

## 2014-12-25 LAB — STREP PNEUMONIAE URINARY ANTIGEN: STREP PNEUMO URINARY ANTIGEN: NEGATIVE

## 2014-12-25 LAB — EXPECTORATED SPUTUM ASSESSMENT W GRAM STAIN, RFLX TO RESP C

## 2014-12-25 MED ORDER — TECHNETIUM TC 99M DIETHYLENETRIAME-PENTAACETIC ACID: 40.0000 | Freq: Once | INTRAVENOUS | Status: AC | PRN

## 2014-12-25 MED ORDER — METHYLPREDNISOLONE SODIUM SUCC 125 MG IJ SOLR
60.0000 mg | Freq: Three times a day (TID) | INTRAMUSCULAR | Status: DC
  Administered 2014-12-25 – 2014-12-26 (×3): 60 mg via INTRAVENOUS
  Filled 2014-12-25 (×3): qty 0.96

## 2014-12-25 MED ORDER — BUDESONIDE 0.25 MG/2ML IN SUSP
0.2500 mg | Freq: Two times a day (BID) | RESPIRATORY_TRACT | Status: DC
  Administered 2014-12-25 – 2014-12-26 (×2): 0.25 mg via RESPIRATORY_TRACT
  Filled 2014-12-25 (×5): qty 2

## 2014-12-25 MED ORDER — DOXYCYCLINE HYCLATE 100 MG PO TABS
100.0000 mg | ORAL_TABLET | Freq: Two times a day (BID) | ORAL | Status: DC
  Administered 2014-12-25 – 2014-12-26 (×3): 100 mg via ORAL
  Filled 2014-12-25 (×4): qty 1

## 2014-12-25 MED ORDER — TECHNETIUM TO 99M ALBUMIN AGGREGATED
6.0000 | Freq: Once | INTRAVENOUS | Status: AC | PRN
  Administered 2014-12-25: 6 via INTRAVENOUS

## 2014-12-25 MED ORDER — FUROSEMIDE 10 MG/ML IJ SOLN
40.0000 mg | Freq: Every day | INTRAMUSCULAR | Status: DC
  Administered 2014-12-26: 40 mg via INTRAVENOUS
  Filled 2014-12-25: qty 4

## 2014-12-25 MED ORDER — INSULIN ASPART 100 UNIT/ML ~~LOC~~ SOLN
0.0000 [IU] | Freq: Three times a day (TID) | SUBCUTANEOUS | Status: DC
  Administered 2014-12-25: 8 [IU] via SUBCUTANEOUS
  Administered 2014-12-25: 5 [IU] via SUBCUTANEOUS
  Administered 2014-12-26 (×2): 3 [IU] via SUBCUTANEOUS

## 2014-12-25 MED ORDER — ALBUTEROL SULFATE (2.5 MG/3ML) 0.083% IN NEBU
2.5000 mg | INHALATION_SOLUTION | Freq: Three times a day (TID) | RESPIRATORY_TRACT | Status: DC
  Administered 2014-12-25 – 2014-12-26 (×2): 2.5 mg via RESPIRATORY_TRACT
  Filled 2014-12-25 (×2): qty 3

## 2014-12-25 NOTE — Progress Notes (Signed)
[facility] PROGRESS NOTE  Mark [name] ZOX:[id number] DOB: Mar 18, 1946 DOA: 12/24/2014 PCP: Lynnea Ferrier TOM, MD  Assessment/Plan: 1-acute resp failure with hypoxia: on admission O2 sat in 80% on RA. -patient is currently afebrile and his O2 sat has improved with oxygen supplementation -presumed that his SOB/hypoxia is secondary to COPD exacerbation, bronchiectasis and CHF exacerbation (acute on chronic diastolic HF). -will continue IV lasix, low sodium diet, daily weights and strict intake and output -continue IV steroids, pulmicort, spiriva, PRN albuterol and doxycycline for bronchiectasis/COPD exacerbation -will check V/Q scan to r/o PE (patient with recent surgery)  -will repeat 2-D echo to assess EF and contractility  2-atrial fibrillation: rate controlled and currently in sinus rhythm -continue cardizem, metoprolol and amiodarone -was taken off anticoagulation in Januray of this year given worsening renal function and recurrent epistaxis  -on aspirin full dose  3-BPH: continue flomax  4-diabetes type 2: will continue home regimen and SSI -will check A1C  5-CKD stage 3-4: Cr 2.20 -stable and at baseline -will monitor closely due to need for diuresis   6-HLD: continue statins  7-GERD: continue famotidine   Code Status: Full Family Communication: no family at bedside  Disposition Plan: remains inpatient, still SOB and with exp wheezing/bibasilar crackles. Complete work up with 2-D echo and V/Q scan   Consultants:  None   Procedures:  V/Q scan: pending  2-D echo: pending  See below for x-ray reports   Antibiotics:  Levaquin 4/8>>4/9  Doxycycline 4/9 (to provide better atypical coverage)  HPI/Subjective: Feeling better and breathing easier. Denies CP. Patient is afebrile. Still with expiratory wheezing on exam, but according to patient significantly improved  Objective: Filed Vitals:   12/25/14 0436  BP: 117/51  Pulse: 69  Temp: 97.6 F (36.4  C)  Resp: 19    Intake/Output Summary (Last 24 hours) at 12/25/14 0838 Last data filed at 12/25/14 0827  Gross per 24 hour  Intake    270 ml  Output   1150 ml  Net   -880 ml   Filed Weights   12/24/14 1731 12/24/14 2100 12/25/14 0436  Weight: 63.674 kg (140 lb 6 oz) 63.866 kg (140 lb 12.8 oz) 62.596 kg (138 lb)    Exam:   General:  Feeling better and breathing easier. No fever. Denies CP currently. Patient able to speak in full sentences and with O2 sat of 92-93% on 2 L [place]  Cardiovascular: S1 and S2, no rubs or gallops; mild JVD appreciated  Respiratory: exp wheezing and bibasilar crackles appreciated on exam. No use of accessory muscles  Abdomen: soft, NT, ND, positive BS  Musculoskeletal: trace LE edema, no cyanosis   Data Reviewed: Basic Metabolic Panel:  Recent Labs Lab 12/24/14 1745 12/25/14 0348  NA 134* 132*  K 4.7 4.6  CL 102 103  CO2 25 24  GLUCOSE 126* 113*  BUN 24* 26*  CREATININE 2.22* 2.27*  CALCIUM 9.0 8.4   CBC:  Recent Labs Lab 12/24/14 1745  WBC 11.8*  NEUTROABS 9.6*  HGB 13.0  HCT 40.2  MCV 93.1  PLT 170   Cardiac Enzymes:  Recent Labs Lab 12/24/14 1745 12/24/14 2300 12/25/14 0348  TROPONINI <0.03 <0.03 <0.03   BNP (last 3 results)  Recent Labs  12/24/14 1745  BNP 854.1*    ProBNP (last 3 results)  Recent Labs  08/27/14 0604 08/30/14 0248 08/31/14 0328  PROBNP 7267.0* 5324.0* 4388.0*    CBG:  Recent Labs Lab 12/22/14 0901 12/22/14 1204  GLUCAP 93 106*  Recent Results (from the past 240 hour(s))  Culture, sputum-assessment     Status: None   Collection Time: 12/25/14  1:48 AM  Result Value Ref Range Status   Specimen Description SPUTUM  Final   Special Requests NONE  Final   Sputum evaluation   Final    THIS SPECIMEN IS ACCEPTABLE. RESPIRATORY CULTURE REPORT TO FOLLOW.   Report Status 12/25/2014 FINAL  Final     Studies: Dg Chest 2 View  12/24/2014   CLINICAL DATA:  Shortness of breath, COPD   EXAM: CHEST  2 VIEW  COMPARISON:  12/16/2014  FINDINGS: Cardiomediastinal silhouette is stable. Again noted bilateral reticular interstitial prominence with slight worsening from prior exam especially in lower lobes. Findings suspicious for mild edema or pneumonitis superimposed on chronic interstitial lung disease. Question bilateral small pleural effusion or basilar infiltrate.  IMPRESSION: Again noted bilateral reticular interstitial prominence with slight worsening from prior exam especially in lower lobes. Findings suspicious for mild edema or pneumonitis superimposed on chronic interstitial lung disease. Question bilateral small pleural effusion or basilar infiltrate.   Electronically Signed   By: Natasha MeadLiviu  Pop M.D.   On: 12/24/2014 18:41    Scheduled Meds: . albuterol  2.5 mg Nebulization Q6H  . amiodarone  200 mg Oral Daily  . aspirin  325 mg Oral Daily  . atorvastatin  10 mg Oral Daily  . budesonide (PULMICORT) nebulizer solution  0.25 mg Nebulization BID  . diltiazem  360 mg Oral q morning - 10a  . doxycycline  100 mg Oral Q12H  . enoxaparin (LOVENOX) injection  30 mg Subcutaneous QHS  . famotidine  10 mg Oral Daily  . [START ON 12/26/2014] furosemide  40 mg Intravenous Daily  . guaiFENesin  600 mg Oral BID  . insulin aspart  0-15 Units Subcutaneous TID WC  . linagliptin  5 mg Oral Daily  . methylPREDNISolone (SOLU-MEDROL) injection  60 mg Intravenous 3 times per day  . metoprolol succinate  75 mg Oral Daily  . sodium chloride  3 mL Intravenous Q12H  . sodium chloride  3 mL Intravenous Q12H  . tamsulosin  0.4 mg Oral Daily  . tiotropium  18 mcg Inhalation Daily    Time spent: 30 minutes    Vassie LollMadera, Lea Walbert  [facility] Pager [phone]. If 7PM-7AM, please contact night-coverage at [email or web address], password [email or web address] 12/25/2014, 8:38 AM  LOS: 1 day

## 2014-12-26 DIAGNOSIS — R0602 Shortness of breath: Secondary | ICD-10-CM | POA: Insufficient documentation

## 2014-12-26 DIAGNOSIS — I5033 Acute on chronic diastolic (congestive) heart failure: Principal | ICD-10-CM

## 2014-12-26 DIAGNOSIS — J9601 Acute respiratory failure with hypoxia: Secondary | ICD-10-CM | POA: Insufficient documentation

## 2014-12-26 DIAGNOSIS — J471 Bronchiectasis with (acute) exacerbation: Secondary | ICD-10-CM | POA: Insufficient documentation

## 2014-12-26 DIAGNOSIS — I509 Heart failure, unspecified: Secondary | ICD-10-CM | POA: Insufficient documentation

## 2014-12-26 LAB — GLUCOSE, CAPILLARY
GLUCOSE-CAPILLARY: 169 mg/dL — AB (ref 70–99)
GLUCOSE-CAPILLARY: 197 mg/dL — AB (ref 70–99)

## 2014-12-26 MED ORDER — DOXYCYCLINE HYCLATE 100 MG PO TABS: 100.0000 mg | ORAL_TABLET | Freq: Two times a day (BID) | ORAL | Status: DC

## 2014-12-26 MED ORDER — GUAIFENESIN ER 600 MG PO TB12: 600.0000 mg | ORAL_TABLET | Freq: Two times a day (BID) | ORAL | Status: DC

## 2014-12-26 MED ORDER — PREDNISONE 20 MG PO TABS: ORAL_TABLET | ORAL | Status: DC

## 2014-12-26 NOTE — Progress Notes (Signed)
SATURATION QUALIFICATIONS: (This note is used to comply with regulatory documentation for home oxygen)  Patient Saturations on Room Air at Rest = 97%  Patient Saturations on Room Air while Ambulating (200 ft) = 85%  Patient Saturations on 2 Liters of oxygen while Ambulating (200 ft) = 96%  Patient Saturations on 2 Liters of oxygen at rest (AFTER ambulating) = 88%  Please briefly explain why patient needs home oxygen:

## 2014-12-26 NOTE — Care Management Note (Signed)
    Page 1 of 1   12/26/2014     4:30:37 PM CARE MANAGEMENT NOTE 12/26/2014  Patient:  RAVINDER, LUKEHART   Account Number:  [id number]  Date Initiated:  12/26/2014  Documentation initiated by:  [facility]  Subjective/Objective Assessment:   adm: Shortness of Breath      Action/Plan:   discharge planning   Anticipated DC Date:  12/26/2014   Anticipated DC Plan:  [place]  CM consult      Choice offered to / List presented to:     DME arranged  [facility]      DME agency  [place].        Status of service:  Completed, signed off Medicare Important Message given?   (If response is "NO", the following Medicare IM given date fields will be blank) Date Medicare IM given:   Medicare IM given by:   Date Additional Medicare IM given:   Additional Medicare IM given by:    Discharge Disposition:  HOME/SELF CARE  Per UR Regulation:    If discussed at Long Length of Stay Meetings, dates discussed:    Comments:  12/26/14 12:43 CM contacted by MD to arrange for home oxygen. CM spoke with [facility] DME rep, JAMES who has already met with pt and is arranging for O2 at home.  No other CM needs were communicated.  Mariane Masters, BSN, CM [phone].

## 2014-12-26 NOTE — Discharge Summary (Signed)
Physician Discharge Summary  Mark [name] ZOX:[id number] DOB: December 27, 1945 DOA: 12/24/2014  PCP: Leo Grosser, MD  Admit date: 12/24/2014 Discharge date: 12/26/2014  Time spent: >30 minutes  Recommendations for Outpatient Follow-up:  1. Check BMET to follow electrolytes and renal funciton 2. Reassess possibility of anticoagulation with [place] of eliquis 3. Patient needs follow up with pulmonologist for PFT's and further evaluation/recommendations on long term COPD therapy. 4. Follow oxygen needs.  Discharge Diagnoses:  Acute resp failure with hypoxia COPD exacerbation Bronchiectasis CHF exacerbation (acute on chronic diastolic) Chronic atrial fibrillation  HTN HLD Diabetes type 2 with nephropathy CKD stage 3 BPH GERD   Discharge Condition: stable and improved. Discharge home with instructions to follow with PCP in 10 days. Will also benefit of pulmonologist visit for PFT's and further evaluation/decision treatment on his COPD.  Diet recommendation: heart healthy and low carbohydrates   Filed Weights   12/24/14 2100 12/25/14 0436 12/26/14 0515  Weight: 63.866 kg (140 lb 12.8 oz) 62.596 kg (138 lb) 62.914 kg (138 lb 11.2 oz)    History of present illness:  69 y.o. male, with past medical history significant for hypertension, diabetes, hyperlipidemia , COPD and diastolic congestive heart failure presenting today with shortness of breath and hypoxemia. The patient has an oxygen monitor at home and he noted that his pulse ox was in the 60s. Patient is not on home oxygen. The patient had a recent surgery for right inguinal hernia around 2 days prior to admission. He was not intubated. No report of fever chills but the patient reports a productive cough with creamy sputum. Chest x-ray shows mild congestive heart failure and chronic bronchitic changes. No chest pains, nausea vomiting or dizziness. Patient had an echo in December of 2015, which showed an ejection fraction of 60%.    Hospital Course:  1-acute resp failure with hypoxia: on admission O2 sat in 80% on RA. -patient is currently afebrile and his O2 sat has improved with oxygen supplementation of 2L; will discharge home with oxygen mainly for exertion -presumed that his SOB/hypoxia is secondary to COPD exacerbation, bronchiectasis and CHF exacerbation (acute on chronic diastolic HF). -will continue low sodium diet and home dose lasix -will discharge on tapering steroids over 10 days, continue doxycycline to complete 7 days tx and will use albuterol, spiriva and symbicort -patient also discharge on mucinex and will benefit of pulmonologist visit for PFT's and further evaluation/treatment on long term therapy -V/Q scan neg for PE   2-atrial fibrillation: rate controlled and currently in sinus rhythm -continue cardizem, metoprolol and amiodarone -was taken off anticoagulation in Januray of this year given worsening renal function and recurrent epistaxis (might be a candidate for eliquis; PCP and cardiologist to decide on that) -on aspirin full dose  3-BPH: continue flomax  4-diabetes type 2 with nephropathy:  -will continue home hypoglycemic regimen  5-CKD stage 3-4: Cr 2.20 -stable and at baseline -BMET to be repeated at outpatient follow up  6-HLD: continue statins  7-GERD: continue famotidine  8-Right inguinal hernia: s/p repair on 12/22/14 -outpatient follow up with [facility] as previously instructed  Procedures:  See below for x-ray reports   V/Q scan: neg for PE  Consultations:  None   Discharge Exam: Filed Vitals:   12/26/14 0515  BP: 115/67  Pulse: 70  Temp: 98 F (36.7 C)  Resp: 18     General: feeling better, no CP and significant improvement in his breathing. No fever  Cardiovascular: S1 and S2, no rubs or gallops;  mild JVD appreciated  Respiratory: exp wheezing and bibasilar crackles appreciated on exam. No use of accessory muscles  Abdomen: soft, NT, ND, positive  BS  Musculoskeletal: trace LE edema, no cyanosis  Discharge Instructions   Discharge Instructions    Diet - low sodium heart healthy    Complete by:  As directed      Discharge instructions    Complete by:  As directed   Take medications as prescribed Please follow low sodium diet (less than 2 grams) Use oxygen especially for exertion Follow up with PCP in 10 days Follow up with pulmonologist in 2-3 weeks (you will be contacted with appointment details)          Current Discharge Medication List    START taking these medications   Details  doxycycline (VIBRA-TABS) 100 MG tablet Take 1 tablet (100 mg total) by mouth every 12 (twelve) hours. Qty: 14 tablet, Refills: 0    guaiFENesin (MUCINEX) 600 MG 12 hr tablet Take 1 tablet (600 mg total) by mouth 2 (two) times daily. Qty: 40 tablet, Refills: 1      CONTINUE these medications which have CHANGED   Details  predniSONE (DELTASONE) 20 MG tablet Take 3 tablets by mouth daily X 1 day; then 2 tablets by mouth daily X 3 days; then 1 tablet by mouth daily X 3 days; then 1/2 tablet by mouth daily X 3 days and stop prednisone Qty: 15 tablet, Refills: 0   Associated Diagnoses: COPD exacerbation      CONTINUE these medications which have NOT CHANGED   Details  albuterol (VENTOLIN HFA) 108 (90 BASE) MCG/ACT inhaler Inhale 2 puffs into the lungs every 6 (six) hours as needed for wheezing or shortness of breath. Qty: 1 Inhaler, Refills: 6    amiodarone (PACERONE) 200 MG tablet TAKE 1 TABLET (200 MG TOTAL) BY MOUTH DAILY. Qty: 30 tablet, Refills: 5    aspirin 325 MG tablet Take 325 mg by mouth daily.    atorvastatin (LIPITOR) 10 MG tablet Take 1 tablet (10 mg total) by mouth daily. Qty: 90 tablet, Refills: 3    budesonide-formoterol (SYMBICORT) 160-4.5 MCG/ACT inhaler Inhale 2 puffs into the lungs 2 (two) times daily. Qty: 1 Inhaler, Refills: 11    diltiazem (TAZTIA XT) 360 MG 24 hr capsule Take 360 mg by mouth every morning.      furosemide (LASIX) 40 MG tablet Take 1 tablet (40 mg total) by mouth daily. Qty: 60 tablet, Refills: 5   Associated Diagnoses: Renal insufficiency    linagliptin (TRADJENTA) 5 MG TABS tablet Take 1 tablet (5 mg total) by mouth daily. Qty: 30 tablet, Refills: 11    metoprolol succinate (TOPROL-XL) 25 MG 24 hr tablet TAKE 3 TABLETS BY MOUTH EVERY DAY TAKE WITH OR IMMEDIATELY AFTER BREAKFAST Qty: 90 tablet, Refills: 11    oxyCODONE-acetaminophen (ROXICET) 5-325 MG per tablet Take 1-2 tablets by mouth every 4 (four) hours as needed for severe pain. Qty: 30 tablet, Refills: 0    ranitidine (ZANTAC) 150 MG tablet Take 150 mg by mouth daily as needed for heartburn.    tamsulosin (FLOMAX) 0.4 MG CAPS capsule Take 1 capsule (0.4 mg total) by mouth daily. Qty: 30 capsule, Refills: 3    tiotropium (SPIRIVA HANDIHALER) 18 MCG inhalation capsule Place 1 capsule (18 mcg total) into inhaler and inhale daily. Qty: 30 capsule, Refills: 11       Allergies  Allergen Reactions  . Niaspan [Niacin Er] Anaphylaxis   Follow-up Information  Follow up with [facility] TOM, MD. Schedule an appointment as soon as possible for a visit in 10 days.   Specialty:  Family Medicine   Contact information:   [address] [place] [address] [phone]       Follow up with Sandrea Hughs, MD. Schedule an appointment as soon as possible for a visit in 3 weeks.   Specialty:  Pulmonary Disease   Why:  needs PFT's and further evaluationa nd treatment of COPD; has never seen a lung specialist    Contact information:   [address] [place] [address] [phone]        The results of significant diagnostics from this hospitalization (including imaging, microbiology, ancillary and laboratory) are listed below for reference.    Significant Diagnostic Studies: Dg Chest 2 View  12/24/2014   CLINICAL DATA:  Shortness of breath, COPD  EXAM: CHEST  2 VIEW  COMPARISON:  12/16/2014  FINDINGS:  Cardiomediastinal silhouette is stable. Again noted bilateral reticular interstitial prominence with slight worsening from prior exam especially in lower lobes. Findings suspicious for mild edema or pneumonitis superimposed on chronic interstitial lung disease. Question bilateral small pleural effusion or basilar infiltrate.  IMPRESSION: Again noted bilateral reticular interstitial prominence with slight worsening from prior exam especially in lower lobes. Findings suspicious for mild edema or pneumonitis superimposed on chronic interstitial lung disease. Question bilateral small pleural effusion or basilar infiltrate.   Electronically Signed   By: Natasha Mead M.D.   On: 12/24/2014 18:41   Dg Chest 2 View  12/16/2014   CLINICAL DATA:  Preoperative examination prior to inguinal hernia repair next week; recent episodes of shortness of breath and cough with chest congestion ; history of COPD and diabetes and long-term tobacco use now discontinued  EXAM: CHEST  2 VIEW  COMPARISON:  PA and lateral chest x-ray of October 07, 2014  FINDINGS: The lungs are well-expanded. The interstitial markings are coarse with areas of patchy confluent density in the right upper and lower and left lower lobes. There is no significant pleural effusion. The cardiac silhouette is mildly enlarged. The pulmonary vascularity is prominent centrally but there is no definite cephalization. There is mild tortuosity of the descending thoracic aorta.  IMPRESSION: Increase conspicuity of patchy densities in both lungs suggests subsegmental atelectasis or mild infiltrate. Parenchymal nodules may be present. Low-grade compensated CHF is likely as well.  Chest CT scanning is recommended to further evaluate the pulmonary parenchyma.   Electronically Signed   By: David  [place]   On: 12/16/2014 12:52   Nm Pulmonary Perf And Vent  12/25/2014   CLINICAL DATA:  Short of breath for 48 hr. COPD and CHF. Concern for pulmonary embolism. Surgery on 12/22/2014   EXAM: NUCLEAR MEDICINE VENTILATION - PERFUSION LUNG SCAN  TECHNIQUE: Ventilation images were obtained in multiple projections using inhaled aerosol technetium 99 M DTPA. Perfusion images were obtained in multiple projections after intravenous injection of Tc-64m MAA.  RADIOPHARMACEUTICALS:  Forty mCi Tc-27m DTPA aerosol and 6 mCi Tc-53m MAA  COMPARISON:  Radiograph 12/24/2014  FINDINGS: Ventilation: Extreme heterogeneity of ventilation within left and right lung, upper and lower lobes consistent with chronic obstructive pulmonary disease.  Perfusion: No wedge-shaped peripheral perfusion defects to suggest acute pulmonary embolism.  IMPRESSION: 1. No evidence of acute pulmonary embolism. 2. Heart heterogeneity of ventilation consists with COPD.   Electronically Signed   By: Genevive Bi M.D.   On: 12/25/2014 10:53    Microbiology: Recent Results (  from the past 240 hour(s))  Culture, blood (routine x 2) Call MD if unable to obtain prior to antibiotics being given     Status: None (Preliminary result)   Collection Time: 12/24/14 11:00 PM  Result Value Ref Range Status   Specimen Description BLOOD LEFT ARM  Final   Special Requests BOTTLES DRAWN AEROBIC AND ANAEROBIC 5CC  Final   Culture   Final           BLOOD CULTURE RECEIVED NO GROWTH TO DATE CULTURE WILL BE HELD FOR 5 DAYS BEFORE ISSUING A FINAL NEGATIVE REPORT Performed at [facility]    Report Status PENDING  Incomplete  Culture, blood (routine x 2) Call MD if unable to obtain prior to antibiotics being given     Status: None (Preliminary result)   Collection Time: 12/24/14 11:10 PM  Result Value Ref Range Status   Specimen Description BLOOD LEFT HAND  Final   Special Requests BOTTLES DRAWN AEROBIC AND ANAEROBIC 5CC  Final   Culture   Final           BLOOD CULTURE RECEIVED NO GROWTH TO DATE CULTURE WILL BE HELD FOR 5 DAYS BEFORE ISSUING A FINAL NEGATIVE REPORT Performed at [facility]    Report Status PENDING   Incomplete  Culture, sputum-assessment     Status: None   Collection Time: 12/25/14  1:48 AM  Result Value Ref Range Status   Specimen Description SPUTUM  Final   Special Requests NONE  Final   Sputum evaluation   Final    THIS SPECIMEN IS ACCEPTABLE. RESPIRATORY CULTURE REPORT TO FOLLOW.   Report Status 12/25/2014 FINAL  Final     Labs: Basic Metabolic Panel:  Recent Labs Lab 12/24/14 1745 12/25/14 0348  NA 134* 132*  K 4.7 4.6  CL 102 103  CO2 25 24  GLUCOSE 126* 113*  BUN 24* 26*  CREATININE 2.22* 2.27*  CALCIUM 9.0 8.4   CBC:  Recent Labs Lab 12/24/14 1745  WBC 11.8*  NEUTROABS 9.6*  HGB 13.0  HCT 40.2  MCV 93.1  PLT 170   Cardiac Enzymes:  Recent Labs Lab 12/24/14 1745 12/24/14 2300 12/25/14 0348 12/25/14 1100  TROPONINI <0.03 <0.03 <0.03 <0.03   BNP: BNP (last 3 results)  Recent Labs  12/24/14 1745  BNP 854.1*    ProBNP (last 3 results)  Recent Labs  08/27/14 0604 08/30/14 0248 08/31/14 0328  PROBNP 7267.0* 5324.0* 4388.0*    CBG:  Recent Labs Lab 12/22/14 1204 12/25/14 1135 12/25/14 1633 12/25/14 2137 12/26/14 0612  GLUCAP 106* 297* 213* 185* 169*    Signed:  Vassie Loll  [facility] 12/26/2014, 10:35 AM

## 2014-12-26 NOTE — Progress Notes (Signed)
Discontinued IV; discontinued telemetry; discharged pt.  Explained discharge instructions and medication doses to pt, and he had no further questions.  [facility] supplied him with home O2, and he was discharged in wheelchair with daughter by his side.  Pt in no acute distress.

## 2014-12-27 LAB — CULTURE, RESPIRATORY W GRAM STAIN
Culture: NORMAL
Gram Stain: NONE SEEN

## 2014-12-27 LAB — LEGIONELLA ANTIGEN, URINE

## 2014-12-27 LAB — HEMOGLOBIN A1C
HEMOGLOBIN A1C: 6.5 % — AB (ref 4.8–5.6)
MEAN PLASMA GLUCOSE: 140 mg/dL

## 2014-12-27 LAB — CULTURE, RESPIRATORY

## 2014-12-31 ENCOUNTER — Ambulatory Visit (INDEPENDENT_AMBULATORY_CARE_PROVIDER_SITE_OTHER): Payer: Medicare Other | Admitting: Cardiology

## 2014-12-31 ENCOUNTER — Encounter: Payer: Self-pay | Admitting: Cardiology

## 2014-12-31 VITALS — BP 130/68 | HR 62 | Ht 66.5 in | Wt 144.0 lb

## 2014-12-31 DIAGNOSIS — J449 Chronic obstructive pulmonary disease, unspecified: Secondary | ICD-10-CM | POA: Diagnosis not present

## 2014-12-31 DIAGNOSIS — I10 Essential (primary) hypertension: Secondary | ICD-10-CM | POA: Diagnosis not present

## 2014-12-31 DIAGNOSIS — I480 Paroxysmal atrial fibrillation: Secondary | ICD-10-CM

## 2014-12-31 DIAGNOSIS — I5032 Chronic diastolic (congestive) heart failure: Secondary | ICD-10-CM | POA: Diagnosis not present

## 2014-12-31 LAB — CULTURE, BLOOD (ROUTINE X 2)
CULTURE: NO GROWTH
CULTURE: NO GROWTH

## 2014-12-31 NOTE — Patient Instructions (Signed)
Medication Instructions:  DECREASE YOUR ASPIRIN TO 81 MG DAILY  Labwork: NONE  Testing/Procedures: NONE  Follow-Up: Your physician wants you to follow-up in: 3 MONTH OV/EKG  You will receive a reminder letter in the mail two months in advance. If you don't receive a letter, please call our office to schedule the follow-up appointment.

## 2014-12-31 NOTE — Progress Notes (Signed)
Cardiology Office Note   Date:  12/31/2014   ID:  Mark [name], DOB [birth year], MRN [id number]  PCP:  Leo Grosser, MD  Cardiologist:   Cassell Clement, MD   No chief complaint on file.     History of Present Illness: Mark [name] is a 69 y.o. male who presents for 3 month follow-up office visit  Mark [name] is a 69 y.o. male with the history of HTN, HL, CKD, AAA s/p repair in 09/2012, Myoview in 09/2012 normal.  Admitted in 07/2013 for AECOPD c/b AFib with RVR. He was placed on Coumadin and rate control Rx. Plan was to proceed with DCCV should he remain in atrial fibrillation. Echo demonstrated normal LVF.  Readmitted 08/2013 with a/c diastolic CHF c/b AKI on CKD. Coumadin was switched to Xarelto. He was also placed on amiodarone. He was discharged with plans to proceed with DCCV in approximately 3 weeks. He was discharged off of Lasix due to worsening renal function.  Readmitted 09/2013 with a/c diastolic CHF and persistent atrial fibrillation. He again had worsening renal function with diuresis. Lasix had to be held. He underwent TEE guided DCCV 09/22/2013. However, he had recurrent atrial fibrillation with RVR. Amiodarone dose was adjusted. He underwent repeat cardioversion by Dr. Ladona Ridgel on 09/28/13. He has remained in normal sinus rhythm since his second cardioversion. He was again not sent home on Lasix due to worsening renal function.  After discharge, his Lasix was resumed due to worsening edema. At the present time he is on Lasix 40 mg daily and appears to be tolerating it well. He has not had any significant peripheral edema. He does have home oxygen which he uses on an as needed basis. He has not been experiencing any palpitations or tachycardia. He's had no dizziness or syncope. He had no chest pain. He has chronic renal disease and is followed by his nephrologist Dr. Allena Katz Since I last saw him he had episodes of recurrent epistaxis. In consultation  with Dr. Tanya Nones, we decided to stop his Xarelto and just use aspirin 325 mg daily. He has had no further epistaxis. His extensive bruising on his arms and legs has improved.  However he is still having a lot of bruising.  We will decrease his aspirin dose to just 81 mg. The patient was rehospitalized in April 2016 with exacerbation of his COPD.  He states that he is going to have to see a pulmonary doctor now.  That appointment is currently pending.  Past Medical History  Diagnosis Date  . GERD (gastroesophageal reflux disease)   . Hyperlipidemia   . H/O hiatal hernia   . Hypertension     Sees Dr. Lynnea Ferrier  . AAA (abdominal aortic aneurysm)     a. 09/2013: Resection and grafting of abdominal aortic aneurysm with insertion of an aorto by common iliac graft using 18 x 9 mm Hemashield Dacron graft.  Marland Kitchen COPD (chronic obstructive pulmonary disease)   . Chronic diastolic CHF (congestive heart failure)     a.  Echo (08/13/2013): EF 55-60%, normal wall motion, mild MR, mild LAE  . Pneumonia   . Arthritis     "little in my hands" (09/08/2013)  . Chronic kidney disease (CKD), stage III (moderate)   . PAF (paroxysmal atrial fibrillation)     a. amiodarone Rx started 08/2013;  b. s/p TEE-DCCV 09/2013 => recurrent AFib => repeat DCCV => NSR;   c. Xarelto 15 QD due to CKD  . Hx of  cardiovascular stress test     a. Myoview 09/2012:  no scar or ischemia  . Diabetes mellitus without complication     TYPE 2    Past Surgical History  Procedure Laterality Date  . Knee arthroscopy Right   . Abdominal aortic aneurysm repair  10/15/2012    Procedure: ANEURYSM ABDOMINAL AORTIC REPAIR;  Surgeon: Pryor OchoaJames D Lawson, MD;  [facility];  Service: Vascular;  Laterality: N/A;  Resection and Grafting of Abdominal Aortic Aneurysm - Aortobi-Iliac   . Tee without cardioversion N/A 09/22/2013    Procedure: TRANSESOPHAGEAL ECHOCARDIOGRAM (TEE);  Surgeon: Lars MassonKatarina H Nelson, MD;  [facility];  Service:  Cardiovascular;  Laterality: N/A;  . Cardioversion N/A 09/22/2013    Procedure: CARDIOVERSION;  Surgeon: Lars MassonKatarina H Nelson, MD;  [facility];  Service: Cardiovascular;  Laterality: N/A;  . Abdominal aortic aneurysm repair  10-15-2012  . Cardioversion  Jan. 12, 2015    Dr. Doylene CanningGregg W. Ladona Ridgelaylor  . Cardioversion N/A 09/28/2013    Procedure: CARDIOVERSION;  Surgeon: Marinus MawGregg W Taylor, MD;  [facility];  Service: Cardiovascular;  Laterality: N/A;  . Inguinal hernia repair Right 12/22/2014    Procedure: OPEN RIGHT INGUINAL HERNIA REPAIR WITH MESH;  Surgeon: Axel FillerArmando Ramirez, MD;  Location: [facility] ORS;  Service: General;  Laterality: Right;  . Insertion of mesh N/A 12/22/2014    Procedure: INSERTION OF MESH;  Surgeon: Axel FillerArmando Ramirez, MD;  Location: [facility] ORS;  Service: General;  Laterality: N/A;     Current Outpatient Prescriptions  Medication Sig Dispense Refill  . albuterol (VENTOLIN HFA) 108 (90 BASE) MCG/ACT inhaler Inhale 2 puffs into the lungs every 6 (six) hours as needed for wheezing or shortness of breath. 1 Inhaler 6  . amiodarone (PACERONE) 200 MG tablet TAKE 1 TABLET (200 MG TOTAL) BY MOUTH DAILY. 30 tablet 5  . aspirin 81 MG tablet Take 81 mg by mouth daily.    Marland Kitchen. atorvastatin (LIPITOR) 10 MG tablet Take 1 tablet (10 mg total) by mouth daily. 90 tablet 3  . budesonide-formoterol (SYMBICORT) 160-4.5 MCG/ACT inhaler Inhale 2 puffs into the lungs 2 (two) times daily. 1 Inhaler 11  . diltiazem (TAZTIA XT) 360 MG 24 hr capsule Take 360 mg by mouth every morning.     Marland Kitchen. doxycycline (VIBRA-TABS) 100 MG tablet Take 1 tablet (100 mg total) by mouth every 12 (twelve) hours. 14 tablet 0  . furosemide (LASIX) 40 MG tablet Take 1 tablet (40 mg total) by mouth daily. 60 tablet 5  . linagliptin (TRADJENTA) 5 MG TABS tablet Take 1 tablet (5 mg total) by mouth daily. 30 tablet 11  . metoprolol succinate (TOPROL-XL) 25 MG 24 hr tablet TAKE 3 TABLETS BY MOUTH EVERY DAY TAKE WITH OR IMMEDIATELY AFTER  BREAKFAST 90 tablet 11  . oxyCODONE-acetaminophen (ROXICET) 5-325 MG per tablet Take 1-2 tablets by mouth every 4 (four) hours as needed for severe pain. 30 tablet 0  . predniSONE (DELTASONE) 20 MG tablet Take 3 tablets by mouth daily X 1 day; then 2 tablets by mouth daily X 3 days; then 1 tablet by mouth daily X 3 days; then 1/2 tablet by mouth daily X 3 days and stop prednisone 15 tablet 0  . ranitidine (ZANTAC) 150 MG tablet Take 150 mg by mouth daily as needed for heartburn.    . tamsulosin (FLOMAX) 0.4 MG CAPS capsule Take 1 capsule (0.4 mg total) by mouth daily. (Patient taking differently: Take 0.4 mg by mouth at bedtime. ) 30 capsule  3  . tiotropium (SPIRIVA HANDIHALER) 18 MCG inhalation capsule Place 1 capsule (18 mcg total) into inhaler and inhale daily. 30 capsule 11   No current facility-administered medications for this visit.    Allergies:   Niaspan    Social History:  The patient  reports that he quit smoking about 17 months ago. His smoking use included Cigarettes. He has a 56 pack-year smoking history. He has quit using smokeless tobacco. His smokeless tobacco use included Chew. He reports that he does not drink alcohol or use illicit drugs.   Family History:  The patient's family history includes Diabetes in his mother; Heart attack in his father; Heart disease in his father and son; Tuberculosis in his mother.    ROS:  Please see the history of present illness.   Otherwise, review of systems are positive for none.   All other systems are reviewed and negative.    PHYSICAL EXAM: VS:  BP 130/68 mmHg  Pulse 62  Ht 5' 6.5" (1.689 m)  Wt 144 lb (65.318 kg)  BMI 22.90 kg/m2 , BMI Body mass index is 22.9 kg/(m^2). GEN: Well nourished, well developed, in no acute distress HEENT: normal Neck: no JVD, carotid bruits, or masses Cardiac: RRR; no murmurs, rubs, or gallops,no edema  Respiratory:  clear to auscultation bilaterally, normal work of breathing GI: soft, nontender,  nondistended, + BS MS: no deformity or atrophy Skin: warm and dry, no rash Neuro:  Strength and sensation are intact Psych: euthymic mood, full affect   EKG:  EKG is not ordered today.    Recent Labs: 08/27/2014: TSH 1.790 08/31/2014: Pro B Natriuretic peptide (BNP) 4388.0* 09/20/2014: ALT 23 12/24/2014: B Natriuretic Peptide 854.1*; Hemoglobin 13.0; Platelets 170 12/25/2014: BUN 26*; Creatinine 2.27*; Potassium 4.6; Sodium 132*    Lipid Panel    Component Value Date/Time   CHOL 119 07/20/2014 1143   TRIG 80 07/20/2014 1143   HDL 54 07/20/2014 1143   CHOLHDL 2.2 07/20/2014 1143   VLDL 16 07/20/2014 1143   LDLCALC 49 07/20/2014 1143      Wt Readings from Last 3 Encounters:  12/31/14 144 lb (65.318 kg)  12/26/14 138 lb 11.2 oz (62.914 kg)  12/22/14 139 lb 8 oz (63.277 kg)        ASSESSMENT AND PLAN:  1. paroxysmal atrial fibrillation maintaining normal sinus rhythm on amiodarone. He has a chadssvasc score of 5(age, hypertension, vascular disease, diabetes, congestive heart failure). He is no longer on Xarelto because of excessive bruising and recurrent epistaxis.  Going forward he will take just an 81 mg aspirin daily 2. chronic diastolic heart failure currently compensated on Lasix 40 mg daily 3. Hypercholesterolemia 4. diabetes mellitus 5. chronic kidney disease 6. COPD with recent exacerbation.   Current medicines are reviewed at length with the patient today.  The patient does not have concerns regarding medicines.  The following changes have been made:  no change  Labs/ tests ordered today include:  No orders of the defined types were placed in this encounter.     Disposition: Continue same medication with the exception of reduction of aspirin dose to 81 mg daily because of excessive skin bruising.  Recheck in 3 months for office visit and EKG. His PCP is also arranging for him to see a pulmonary specialist about his COPD.  Signed, Cassell Clement, MD    12/31/2014 5:48 PM    [facility] [address] [place], [place], [place]  [address] Phone: [phone]; Fax: [phone]   

## 2015-01-04 ENCOUNTER — Other Ambulatory Visit: Payer: Self-pay | Admitting: Family Medicine

## 2015-01-04 ENCOUNTER — Ambulatory Visit (INDEPENDENT_AMBULATORY_CARE_PROVIDER_SITE_OTHER): Payer: Medicare Other | Admitting: Family Medicine

## 2015-01-04 ENCOUNTER — Encounter: Payer: Self-pay | Admitting: Family Medicine

## 2015-01-04 VITALS — BP 128/64 | HR 76 | Temp 98.0°F | Resp 22 | Ht 66.5 in | Wt 146.0 lb

## 2015-01-04 DIAGNOSIS — J302 Other seasonal allergic rhinitis: Secondary | ICD-10-CM

## 2015-01-04 DIAGNOSIS — E119 Type 2 diabetes mellitus without complications: Secondary | ICD-10-CM | POA: Diagnosis not present

## 2015-01-04 DIAGNOSIS — Z09 Encounter for follow-up examination after completed treatment for conditions other than malignant neoplasm: Secondary | ICD-10-CM

## 2015-01-04 DIAGNOSIS — J449 Chronic obstructive pulmonary disease, unspecified: Secondary | ICD-10-CM

## 2015-01-04 MED ORDER — LEVOCETIRIZINE DIHYDROCHLORIDE 5 MG PO TABS: 5.0000 mg | ORAL_TABLET | Freq: Every evening | ORAL | Status: DC

## 2015-01-04 NOTE — Progress Notes (Signed)
Subjective:    Patient ID: Mark [name], male    DOB: [birth year], 69 y.o.   MRN: [id number]  HPI  12/09/14 Patient has COPD. He is currently on Symbicort as well as Spiriva. Over the last week he has developed a cough productive of thick yellow mucus. He reports chest congestion. He reports shortness of breath. His pulse oximetry earlier in the week was in the 70s. He states that he is slightly better today is his pulse oximetry has come up to 88% on room air. He denies chest pain. He does report shortness of breath. He refuses to go the hospital. He also refuses oxygen. Patient states that his symptoms are previous to similar COPD exacerbations. Today on examination there is no evidence of peripheral edema so she is has had in the past with congestive heart failure. On lung exam today he has markedly diminished breath sounds with occasional expiratory wheezes but no crackles Rales. He has no JVD. His weight is stable from his last office visit. There is no evidence of fluid overload on exam.  At that time, my plan was: I believe the patient is having a COPD exacerbation. I will start the patient on prednisone 40 mg a day for 7 days. I'll also cover the patient with azithromycin 500 mg by mouth daily one, 250 mg by mouth daily as 2 through 5. I am aware of the mornings of QTC prolongation on amiodarone however the patient has tolerated this combination in the past and has worked well for him. He can use albuterol 2 puffs inhaled every 6 hours as needed. Continue his breathing Symbicort and recheck Monday or immediately if worse  12/13/14 Patient is doing much better. Today he is 90-91% on room air versus 86%. His breathing is better. He is using his Symbicort and Spiriva on a daily basis. He is taking the prednisone and antibiotics. He has not been using his albuterol at all. He continues to have a cough productive of yellow and thick sputum. He continues to have some mild shortness of breath. He  denies any chest pain or orthopnea.  At taht time, my plan was:  complete prednisone. Complete Zithromax. I instructed the patient I want him to liberalize his use of albuterol. He is to use 2 puffs every 6 hours for the next few days to help fight the bronchospasm, increase his lung volumes, and help his shortness of breath. Continue his breathing Symbicort. Recheck on Friday or sooner if worse  12/17/14 Breathing is much better. Pulse oximetry was 96% on room air. Patient is no longer wheezing. Chest x-ray shows patchy densities in both lungs consistent with mild infiltrate versus CHF. I believe it is due to infiltrate. There is no JVD. There is no peripheral edema. The patient's weight is stable. There is no evidence of decompensated congestive heart failure. Therefore I believe the infiltrate should improve with time after treating him with antibiotics and steroids.  At that time, my plan was:  Patient is clinically improving. I will recheck the patient in one month. At that time I'll repeat a chest x-ray. Return immediately if symptoms worsen. I see no evidence requiring diuresis at the present time. I believe the infiltrate should improve given time after treatment with Zithromax and steroids. Therefore I'll repeat a chest x-ray in one month..  01/04/15  Unfortunately, after the patient had his inguinal hernia surgery, he was admitted to the hospital with shortness of breath. 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 Patient was treated with doxycycline, a 10 day taper of prednisone, and bronchodilators. Patient was also diuresed in the hospital. Today he is here for follow-up. His cardiologist is recommended a pulmonary consultation to help prevent future hospitalizations. Patient is currently on oxygen at night, Symbicort, Spiriva, and daily Mucinex.  Patient states that his breathing had improved  dramatically after the hospitalization. Unfortunately he made his yard yesterday and was covered in dust and pollen. Today he is audibly wheezing on examination. His pulse oximetry is 90-91% on room air. He is audibly wheezing on examination today. He is not using his albuterol. His last albuterol treatment was more than 12 hours ago. He denies any fever. He denies any productive cough. He does complain of chest congestion. Past Medical History  Diagnosis Date  . GERD (gastroesophageal reflux disease)   . Hyperlipidemia   . H/O hiatal hernia   . Hypertension     Sees Dr. Lynnea Ferrier  . AAA (abdominal aortic aneurysm)     a. 09/2013: Resection and grafting of abdominal aortic aneurysm with insertion of an aorto by common iliac graft using 18 x 9 mm Hemashield Dacron graft.  Marland Kitchen COPD (chronic obstructive pulmonary disease)   . Chronic diastolic CHF (congestive heart failure)     a.  Echo (08/13/2013): EF 55-60%, normal wall motion, mild MR, mild LAE  . Pneumonia   . Arthritis     "little in my hands" (09/08/2013)  . Chronic kidney disease (CKD), stage III (moderate)   . PAF (paroxysmal atrial fibrillation)     a. amiodarone Rx started 08/2013;  b. s/p TEE-DCCV 09/2013 => recurrent AFib => repeat DCCV => NSR;   c. Xarelto 15 QD due to CKD  . Hx of cardiovascular stress test     a. Myoview 09/2012:  no scar or ischemia  . 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 Knee arthroscopy Right   . Abdominal aortic aneurysm repair  10/15/2012    Procedure: ANEURYSM ABDOMINAL AORTIC REPAIR;  Surgeon: Pryor Ochoa, MD;  Location: [facility] OR;  Service: Vascular;  Laterality: N/A;  Resection and Grafting of Abdominal Aortic Aneurysm - Aortobi-Iliac   . Tee without cardioversion N/A 09/22/2013    Procedure: TRANSESOPHAGEAL ECHOCARDIOGRAM (TEE);  Surgeon: Lars Masson, MD;  Location: [facility] ENDOSCOPY;  Service: Cardiovascular;  Laterality: N/A;  .  Cardioversion N/A 09/22/2013    Procedure: CARDIOVERSION;  Surgeon: Lars Masson, MD;  Location: [facility] ENDOSCOPY;  Service: Cardiovascular;  Laterality: N/A;  . Abdominal aortic aneurysm repair  10-15-2012  . Cardioversion  Jan. 12, 2015    Dr. Doylene Canning. Ladona Ridgel  . Cardioversion N/A 09/28/2013    Procedure: CARDIOVERSION;  Surgeon: Marinus Maw, MD;  Location: [facility] CATH LAB;  Service: Cardiovascular;  Laterality: N/A;  . Inguinal hernia repair Right 12/22/2014    Procedure: OPEN RIGHT INGUINAL HERNIA REPAIR WITH MESH;  Surgeon: Axel Filler, MD;  Location: [facility] ORS;  Service: General;  Laterality: Right;  . Insertion of mesh N/A 12/22/2014    Procedure: INSERTION OF MESH;  Surgeon: Axel Filler, MD;  Location: [facility] ORS;  Service: General;  Laterality: N/A;   Current Outpatient Prescriptions on File Prior to Visit  Medication Sig Dispense Refill  . albuterol (VENTOLIN HFA) 108 (90 BASE) MCG/ACT inhaler Inhale 2 puffs into the lungs  every 6 (six) hours as needed for wheezing or shortness of breath. 1 Inhaler 6  . amiodarone (PACERONE) 200 MG tablet TAKE 1 TABLET (200 MG TOTAL) BY MOUTH DAILY. 30 tablet 5  . aspirin 81 MG tablet Take 81 mg by mouth daily.    Marland Kitchen. atorvastatin (LIPITOR) 10 MG tablet Take 1 tablet (10 mg total) by mouth daily. 90 tablet 3  . budesonide-formoterol (SYMBICORT) 160-4.5 MCG/ACT inhaler Inhale 2 puffs into the lungs 2 (two) times daily. 1 Inhaler 11  . diltiazem (TAZTIA XT) 360 MG 24 hr capsule Take 360 mg by mouth every morning.     . furosemide (LASIX) 40 MG tablet Take 1 tablet (40 mg total) by mouth daily. 60 tablet 5  . linagliptin (TRADJENTA) 5 MG TABS tablet Take 1 tablet (5 mg total) by mouth daily. 30 tablet 11  . metoprolol succinate (TOPROL-XL) 25 MG 24 hr tablet TAKE 3 TABLETS BY MOUTH EVERY DAY TAKE WITH OR IMMEDIATELY AFTER BREAKFAST 90 tablet 11  . oxyCODONE-acetaminophen (ROXICET) 5-325 MG per tablet Take 1-2 tablets by mouth every 4 (four) hours as needed for  severe pain. 30 tablet 0  . predniSONE (DELTASONE) 20 MG tablet Take 3 tablets by mouth daily X 1 day; then 2 tablets by mouth daily X 3 days; then 1 tablet by mouth daily X 3 days; then 1/2 tablet by mouth daily X 3 days and stop prednisone 15 tablet 0  . ranitidine (ZANTAC) 150 MG tablet Take 150 mg by mouth daily as needed for heartburn.    . tamsulosin (FLOMAX) 0.4 MG CAPS capsule Take 1 capsule (0.4 mg total) by mouth daily. (Patient taking differently: Take 0.4 mg by mouth at bedtime. ) 30 capsule 3  . tiotropium (SPIRIVA HANDIHALER) 18 MCG inhalation capsule Place 1 capsule (18 mcg total) into inhaler and inhale daily. 30 capsule 11  . doxycycline (VIBRA-TABS) 100 MG tablet Take 1 tablet (100 mg total) by mouth every 12 (twelve) hours. (Patient not taking: Reported on 01/04/2015) 14 tablet 0   No current facility-administered medications on file prior to visit.   Allergies  Allergen Reactions  . Niaspan [Niacin Er] Anaphylaxis   History   Social History  . Marital Status: Married    Spouse Name: [name]  . Number of Children: 4  . Years of Education: N/A   Occupational History  . Works PT as a Education administratorpainter    Social History Main Topics  . Smoking status: Former Smoker -- 1.00 packs/day for 56 years    Types: Cigarettes    Quit date: 07/31/2013  . Smokeless tobacco: Former NeurosurgeonUser    Types: Chew     Comment: 09/08/2013 "stopped smoking cigarettes ~ 2 months ago; aien't chew in probably 10 yrs"  . Alcohol Use: No  . Drug Use: No  . Sexual Activity: Not Currently   Other Topics Concern  . Not on file   Social History Narrative   Married.  Lives with wife.  Ambulates without assistance.     Review of Systems  All other systems reviewed and are negative.      Objective:   Physical Exam  Constitutional: He appears well-developed and well-nourished. No distress.  Neck: Neck supple. No JVD present.  Cardiovascular: Normal rate, regular rhythm and normal heart sounds.     Pulmonary/Chest: No accessory muscle usage. No tachypnea. No respiratory distress. He has decreased breath sounds. He has wheezes.  Abdominal: Soft. Bowel sounds are normal.  Musculoskeletal: He exhibits no edema.  Lymphadenopathy:    He has no cervical adenopathy.  Skin: He is not diaphoretic.  Vitals reviewed.         Assessment & Plan:  Diabetes mellitus type II, controlled - Plan: COMPLETE METABOLIC PANEL WITH GFR, CBC with Differential/Platelet, Hemoglobin A1c  Seasonal allergies - Plan: levocetirizine (XYZAL) 5 MG tablet  Hospital discharge follow-up - Plan: Ambulatory referral to Pulmonology STAGE IV COPD  I will gladly schedule the patient to see a pulmonologist for second opinion. At this point the only other option I see for treatment of this patient would be either adding daliresp vs zithromax 500 mg 3 times a week due to his bronchoiectasis based on the EMBRACE trial.  I once again emphasized to the patient that he needs to use his albuterol more frequently. I recommended every 6 hours as needed for wheezing. Certainly today he is wheezing. I recommended he go home and liberalize his use of albuterol to try to help treat the bronchospasms that lead to the COPD exacerbations that he is having. I will also add Xyzal 5 mg a day for seasonal allergies to hopefully help treat the trigger which seems to be causing this. I will repeat a CMP as well as a hemoglobin A1c today to monitor the management of his diabetes particular given his frequent use of oral corticosteroids.    I have asked the patient to recheck on Friday given the abnormalities on his pulmonary examination today. His wheezing is worsening, I would resume prednisone for COPD exacerbation as well as start the patient on Zithromax. I am aware of the QTC prolongation coupling Zithromax with amiodarone however the patient has tolerated this well in the past and has responded well to this antibiotic regimen

## 2015-01-05 LAB — COMPLETE METABOLIC PANEL WITH GFR
ALK PHOS: 49 U/L (ref 39–117)
ALT: 31 U/L (ref 0–53)
AST: 22 U/L (ref 0–37)
Albumin: 3.1 g/dL — ABNORMAL LOW (ref 3.5–5.2)
BUN: 58 mg/dL — ABNORMAL HIGH (ref 6–23)
CALCIUM: 8.2 mg/dL — AB (ref 8.4–10.5)
CO2: 23 meq/L (ref 19–32)
CREATININE: 2.4 mg/dL — AB (ref 0.50–1.35)
Chloride: 99 mEq/L (ref 96–112)
GFR, EST AFRICAN AMERICAN: 30 mL/min — AB
GFR, EST NON AFRICAN AMERICAN: 26 mL/min — AB
GLUCOSE: 169 mg/dL — AB (ref 70–99)
Potassium: 5 mEq/L (ref 3.5–5.3)
Sodium: 133 mEq/L — ABNORMAL LOW (ref 135–145)
Total Bilirubin: 0.4 mg/dL (ref 0.2–1.2)
Total Protein: 5.4 g/dL — ABNORMAL LOW (ref 6.0–8.3)

## 2015-01-05 LAB — CBC WITH DIFFERENTIAL/PLATELET
Basophils Absolute: 0 10*3/uL (ref 0.0–0.1)
Basophils Relative: 0 % (ref 0–1)
Eosinophils Absolute: 0 10*3/uL (ref 0.0–0.7)
Eosinophils Relative: 0 % (ref 0–5)
HCT: 36.1 % — ABNORMAL LOW (ref 39.0–52.0)
Hemoglobin: 12.1 g/dL — ABNORMAL LOW (ref 13.0–17.0)
LYMPHS PCT: 7 % — AB (ref 12–46)
Lymphs Abs: 0.6 10*3/uL — ABNORMAL LOW (ref 0.7–4.0)
MCH: 30.1 pg (ref 26.0–34.0)
MCHC: 33.5 g/dL (ref 30.0–36.0)
MCV: 89.8 fL (ref 78.0–100.0)
MONO ABS: 0.4 10*3/uL (ref 0.1–1.0)
MPV: 9.6 fL (ref 8.6–12.4)
Monocytes Relative: 4 % (ref 3–12)
NEUTROS ABS: 7.8 10*3/uL — AB (ref 1.7–7.7)
Neutrophils Relative %: 89 % — ABNORMAL HIGH (ref 43–77)
Platelets: 150 10*3/uL (ref 150–400)
RBC: 4.02 MIL/uL — AB (ref 4.22–5.81)
RDW: 17 % — AB (ref 11.5–15.5)
WBC: 8.8 10*3/uL (ref 4.0–10.5)

## 2015-01-05 LAB — HEMOGLOBIN A1C
HEMOGLOBIN A1C: 7 % — AB (ref <5.7)
Mean Plasma Glucose: 154 mg/dL — ABNORMAL HIGH (ref <117)

## 2015-01-07 ENCOUNTER — Encounter: Payer: Self-pay | Admitting: Family Medicine

## 2015-01-07 ENCOUNTER — Ambulatory Visit (INDEPENDENT_AMBULATORY_CARE_PROVIDER_SITE_OTHER): Payer: Medicare Other | Admitting: Family Medicine

## 2015-01-07 VITALS — BP 110/60 | HR 74 | Temp 97.7°F | Resp 20 | Ht 66.5 in | Wt 146.0 lb

## 2015-01-07 DIAGNOSIS — J449 Chronic obstructive pulmonary disease, unspecified: Secondary | ICD-10-CM | POA: Diagnosis not present

## 2015-01-07 MED ORDER — LINAGLIPTIN 5 MG PO TABS: 5.0000 mg | ORAL_TABLET | Freq: Every day | ORAL | Status: DC

## 2015-01-07 NOTE — Progress Notes (Signed)
Subjective:    Patient ID: Mark [name], male    DOB: [birth year], 69 y.o.   MRN: [id number]  HPI  12/09/14 Patient has COPD. He is currently on Symbicort as well as Spiriva. Over the last week he has developed a cough productive of thick yellow mucus. He reports chest congestion. He reports shortness of breath. His pulse oximetry earlier in the week was in the 70s. He states that he is slightly better today is his pulse oximetry has come up to 88% on room air. He denies chest pain. He does report shortness of breath. He refuses to go the hospital. He also refuses oxygen. Patient states that his symptoms are previous to similar COPD exacerbations. Today on examination there is no evidence of peripheral edema so she is has had in the past with congestive heart failure. On lung exam today he has markedly diminished breath sounds with occasional expiratory wheezes but no crackles Rales. He has no JVD. His weight is stable from his last office visit. There is no evidence of fluid overload on exam.  At that time, my plan was: I believe the patient is having a COPD exacerbation. I will start the patient on prednisone 40 mg a day for 7 days. I'll also cover the patient with azithromycin 500 mg by mouth daily one, 250 mg by mouth daily as 2 through 5. I am aware of the mornings of QTC prolongation on amiodarone however the patient has tolerated this combination in the past and has worked well for him. He can use albuterol 2 puffs inhaled every 6 hours as needed. Continue his breathing Symbicort and recheck Monday or immediately if worse  12/13/14 Patient is doing much better. Today he is 90-91% on room air versus 86%. His breathing is better. He is using his Symbicort and Spiriva on a daily basis. He is taking the prednisone and antibiotics. He has not been using his albuterol at all. He continues to have a cough productive of yellow and thick sputum. He continues to have some mild shortness of breath. He  denies any chest pain or orthopnea.  At taht time, my plan was:  complete prednisone. Complete Zithromax. I instructed the patient I want him to liberalize his use of albuterol. He is to use 2 puffs every 6 hours for the next few days to help fight the bronchospasm, increase his lung volumes, and help his shortness of breath. Continue his breathing Symbicort. Recheck on Friday or sooner if worse  12/17/14 Breathing is much better. Pulse oximetry was 96% on room air. Patient is no longer wheezing. Chest x-ray shows patchy densities in both lungs consistent with mild infiltrate versus CHF. I believe it is due to infiltrate. There is no JVD. There is no peripheral edema. The patient's weight is stable. There is no evidence of decompensated congestive heart failure. Therefore I believe the infiltrate should improve with time after treating him with antibiotics and steroids.  At that time, my plan was:  Patient is clinically improving. I will recheck the patient in one month. At that time I'll repeat a chest x-ray. Return immediately if symptoms worsen. I see no evidence requiring diuresis at the present time. I believe the infiltrate should improve given time after treatment with Zithromax and steroids. Therefore I'll repeat a chest x-ray in one month..  01/04/15  Unfortunately, after the patient had his inguinal hernia surgery, he was admitted to the hospital with shortness of breath. 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 Patient was treated with doxycycline, a 10 day taper of prednisone, and bronchodilators. Patient was also diuresed in the hospital. Today he is here for follow-up. His cardiologist is recommended a pulmonary consultation to help prevent future hospitalizations. Patient is currently on oxygen at night, Symbicort, Spiriva, and daily Mucinex.  Patient states that his breathing had improved  dramatically after the hospitalization. Unfortunately he made his yard yesterday and was covered in dust and pollen. Today he is audibly wheezing on examination. His pulse oximetry is 90-91% on room air. He is audibly wheezing on examination today. He is not using his albuterol. His last albuterol treatment was more than 12 hours ago. He denies any fever. He denies any productive cough. He does complain of chest congestion.  At that time, my plan was:  I will gladly schedule the patient to see a pulmonologist for second opinion. At this point the only other option I see for treatment of this patient would be either adding daliresp vs zithromax 500 mg 3 times a week due to his bronchoiectasis based on the EMBRACE trial.  I once again emphasized to the patient that he needs to use his albuterol more frequently. I recommended every 6 hours as needed for wheezing. Certainly today he is wheezing. I recommended he go home and liberalize his use of albuterol to try to help treat the bronchospasms that lead to the COPD exacerbations that he is having. I will also add Xyzal 5 mg a day for seasonal allergies to hopefully help treat the trigger which seems to be causing this. I will repeat a CMP as well as a hemoglobin A1c today to monitor the management of his diabetes particular given his frequent use of oral corticosteroids.    I have asked the patient to recheck on Friday given the abnormalities on his pulmonary examination today. His wheezing is worsening, I would resume prednisone for COPD exacerbation as well as start the patient on Zithromax. I am aware of the QTC prolongation coupling Zithromax with amiodarone however the patient has tolerated this well in the past and has responded well to this antibiotic regimen.  01/07/15 Patient is doing much better today. Oxygen saturations are 98% on room air. The rhonchorous breath sounds at patient had earlier this week have improved dramatically. His wheezing has also  improved. He continues to complain of mucus in his airways. However, he is using his albuterol every 6 hours and also taking his Mucinex. He denies any fever or chills or purulent sputum. He is scheduled to see the pulmonologist May 6.. Past Medical History  Diagnosis Date  . GERD (gastroesophageal reflux disease)   . Hyperlipidemia   . H/O hiatal hernia   . Hypertension     Sees Dr. Lynnea Ferrier  . AAA (abdominal aortic aneurysm)     a. 09/2013: Resection and grafting of abdominal aortic aneurysm with insertion of an aorto by common iliac graft using 18 x 9 mm Hemashield Dacron graft.  Marland Kitchen COPD (chronic obstructive pulmonary disease)   . Chronic diastolic CHF (congestive heart failure)     a.  Echo (08/13/2013): EF 55-60%, normal wall motion, mild MR, mild LAE  . Pneumonia   . Arthritis     "little in my hands" (09/08/2013)  . Chronic kidney disease (CKD), stage III (moderate)   . PAF (paroxysmal atrial fibrillation)     a. amiodarone Rx started 08/2013;  b. s/p TEE-DCCV 09/2013 => recurrent AFib => repeat DCCV => NSR;   c. Xarelto 15 QD due to CKD  . Hx of cardiovascular stress test     a. Myoview 09/2012:  no scar or ischemia  . 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 Knee arthroscopy Right   . Abdominal aortic aneurysm repair  10/15/2012    Procedure: ANEURYSM ABDOMINAL AORTIC REPAIR;  Surgeon: Pryor OchoaJames D Lawson, MD;  [facility];  Service: Vascular;  Laterality: N/A;  Resection and Grafting of Abdominal Aortic Aneurysm - Aortobi-Iliac   . Tee without cardioversion N/A 09/22/2013    Procedure: TRANSESOPHAGEAL ECHOCARDIOGRAM (TEE);  Surgeon: Lars MassonKatarina H Nelson, MD;  [facility];  Service: Cardiovascular;  Laterality: N/A;  . Cardioversion N/A 09/22/2013    Procedure: CARDIOVERSION;  Surgeon: Lars MassonKatarina H Nelson, MD;  [facility];  Service: Cardiovascular;  Laterality: N/A;  . Abdominal aortic aneurysm  repair  10-15-2012  . Cardioversion  Jan. 12, 2015    Dr. Doylene CanningGregg W. Ladona Ridgelaylor  . Cardioversion N/A 09/28/2013    Procedure: CARDIOVERSION;  Surgeon: Marinus MawGregg W Taylor, MD;  [facility];  Service: Cardiovascular;  Laterality: N/A;  . Inguinal hernia repair Right 12/22/2014    Procedure: OPEN RIGHT INGUINAL HERNIA REPAIR WITH MESH;  Surgeon: Axel FillerArmando Ramirez, MD;  Location: [facility] ORS;  Service: General;  Laterality: Right;  . Insertion of mesh N/A 12/22/2014    Procedure: INSERTION OF MESH;  Surgeon: Axel FillerArmando Ramirez, MD;  Location: [facility] ORS;  Service: General;  Laterality: N/A;   Current Outpatient Prescriptions on File Prior to Visit  Medication Sig Dispense Refill  . albuterol (VENTOLIN HFA) 108 (90 BASE) MCG/ACT inhaler Inhale 2 puffs into the lungs every 6 (six) hours as needed for wheezing or shortness of breath. 1 Inhaler 6  . amiodarone (PACERONE) 200 MG tablet TAKE 1 TABLET (200 MG TOTAL) BY MOUTH DAILY. 30 tablet 5  . aspirin 81 MG tablet Take 81 mg by mouth daily.    Marland Kitchen. atorvastatin (LIPITOR) 10 MG tablet Take 1 tablet (10 mg total) by mouth daily. 90 tablet 3  . budesonide-formoterol (SYMBICORT) 160-4.5 MCG/ACT inhaler Inhale 2 puffs into the lungs 2 (two) times daily. 1 Inhaler 11  . doxycycline (VIBRA-TABS) 100 MG tablet Take 1 tablet (100 mg total) by mouth every 12 (twelve) hours. 14 tablet 0  . furosemide (LASIX) 40 MG tablet Take 1 tablet (40 mg total) by mouth daily. 60 tablet 5  . levocetirizine (XYZAL) 5 MG tablet Take 1 tablet (5 mg total) by mouth every evening. 30 tablet 2  . metoprolol succinate (TOPROL-XL) 25 MG 24 hr tablet TAKE 3 TABLETS BY MOUTH EVERY DAY TAKE WITH OR IMMEDIATELY AFTER BREAKFAST 90 tablet 11  . oxyCODONE-acetaminophen (ROXICET) 5-325 MG per tablet Take 1-2 tablets by mouth every 4 (four) hours as needed for severe pain. 30 tablet 0  . ranitidine (ZANTAC) 150 MG tablet Take 150 mg by mouth daily as needed for heartburn.    Marland Kitchen. SPIRIVA HANDIHALER 18 MCG inhalation  capsule PLACE 1 CAPSULE INTO INHALER AND INHALE INTO THE LUNGS ONCE DAILY. 30 capsule 11  . tamsulosin (FLOMAX) 0.4 MG CAPS capsule Take 1 capsule (0.4 mg total) by mouth daily. (Patient taking differently: Take 0.4 mg by mouth at bedtime. ) 30 capsule 3  . TAZTIA XT 360 MG 24 hr capsule TAKE 1 TABLET EVERY DAY 30 capsule 11  .  tiotropium (SPIRIVA HANDIHALER) 18 MCG inhalation capsule Place 1 capsule (18 mcg total) into inhaler and inhale daily. 30 capsule 11   No current facility-administered medications on file prior to visit.   Allergies  Allergen Reactions  . Niaspan [Niacin Er] Anaphylaxis   History   Social History  . Marital Status: Married    Spouse Name: Mark [name]  . Number of Children: 4  . Years of Education: N/A   Occupational History  . Works PT as a Education administrator    Social History Main Topics  . Smoking status: Former Smoker -- 1.00 packs/day for 56 years    Types: Cigarettes    Quit date: 07/31/2013  . Smokeless tobacco: Former Neurosurgeon    Types: Chew     Comment: 09/08/2013 "stopped smoking cigarettes ~ 2 months ago; aien't chew in probably 10 yrs"  . Alcohol Use: No  . Drug Use: No  . Sexual Activity: Not Currently   Other Topics Concern  . Not on file   Social History Narrative   Married.  Lives with wife.  Ambulates without assistance.     Review of Systems  All other systems reviewed and are negative.      Objective:   Physical Exam  Constitutional: He appears well-developed and well-nourished. No distress.  Neck: Neck supple. No JVD present.  Cardiovascular: Normal rate, regular rhythm and normal heart sounds.   Pulmonary/Chest: No accessory muscle usage. No tachypnea. No respiratory distress. He has decreased breath sounds. He has wheezes.  Abdominal: Soft. Bowel sounds are normal.  Musculoskeletal: He exhibits no edema.  Lymphadenopathy:    He has no cervical adenopathy.  Skin: He is not diaphoretic.  Vitals reviewed.         Assessment &  Plan:  End stage COPD  At the present time there is no indication for steroids. I do not feel that the patient requires antibiotics at the present time. I will defer further treatment decisions to the pulmonologist whom he is seeing in early May. The patient may likely benefit from pulse dose antibiotics on a scheduled basis due to his bronchiectasis but I would defer to their judgment.

## 2015-01-13 ENCOUNTER — Other Ambulatory Visit: Payer: Self-pay | Admitting: Family Medicine

## 2015-01-13 NOTE — Telephone Encounter (Signed)
Pharmacy said they did not receive refill form last week.  Refill resent.

## 2015-01-21 ENCOUNTER — Encounter: Payer: Self-pay | Admitting: Internal Medicine

## 2015-01-21 ENCOUNTER — Other Ambulatory Visit (INDEPENDENT_AMBULATORY_CARE_PROVIDER_SITE_OTHER): Payer: Medicare Other

## 2015-01-21 ENCOUNTER — Ambulatory Visit (INDEPENDENT_AMBULATORY_CARE_PROVIDER_SITE_OTHER): Payer: Medicare Other | Admitting: Internal Medicine

## 2015-01-21 VITALS — BP 122/70 | HR 64 | Ht 66.5 in | Wt 144.8 lb

## 2015-01-21 DIAGNOSIS — R0600 Dyspnea, unspecified: Secondary | ICD-10-CM | POA: Insufficient documentation

## 2015-01-21 DIAGNOSIS — J449 Chronic obstructive pulmonary disease, unspecified: Secondary | ICD-10-CM

## 2015-01-21 DIAGNOSIS — J8410 Pulmonary fibrosis, unspecified: Secondary | ICD-10-CM | POA: Diagnosis not present

## 2015-01-21 LAB — TSH: TSH: 2.64 u[IU]/mL (ref 0.35–4.50)

## 2015-01-21 LAB — BRAIN NATRIURETIC PEPTIDE: Pro B Natriuretic peptide (BNP): 765 pg/mL — ABNORMAL HIGH (ref 0.0–100.0)

## 2015-01-21 LAB — SEDIMENTATION RATE: SED RATE: 52 mm/h — AB (ref 0–22)

## 2015-01-21 NOTE — Patient Instructions (Addendum)
Please remember to go to the lab  department downstairs for your tests - we will call you with the results when they are available.    Stop amiodarone today and spiriva for now   Continue symbicort Take 2 puffs first thing in am and then another 2 puffs about 12 hours later  Only use your albuterol (ventolin)  as a rescue medication to be used if you can't catch your breath by resting or doing a relaxed purse lip breathing pattern.  - The less you use it, the better it will work when you need it. - Ok to use up to 2 puffs  every 4 hours if you must but call for immediate appointment if use goes up over your usual need - Don't leave home without it !!  (think of it like the spare tire for your car)   Please schedule a follow up office visit in 4 weeks, sooner if needed with your inhalers with you and pfts on return  Late add:  rec pred 10 mg 2 daily with bast until improving and then 1 daily until return

## 2015-01-21 NOTE — Progress Notes (Signed)
Subjective:    Patient ID: Mark [name], male    DOB: 10/27/1945,   MRN: [id number]  HPI  [age] yowm quit smoking 12/2012 with doe then that has variably  worsened since then so referred by Dr Tanya NonesPickard to pulmonary clinic 01/21/15   01/21/2015 [facility] office visit/ Archer Moist  On amiodarone/ symb/spiriva with min airflow obst on pfts 01/21/15  Chief Complaint  Patient presents with  . Pulmonary Consult    Referred by Dr. Lynnea FerrierWarren Pickard. Pt states he was dxed with COPD. He c/o SOB and cough for the past yr.  He gets SOB walking 200 ft on a flat suface. Cough is non prod most of the time.   onset of doe and dry cough indolent / progressive while on amiodarone for afib/  just started 02 at hs   No obvious  day to day or daytime variabilty or assoc  cp or chest tightness, subjective wheeze overt sinus or hb symptoms. No unusual exp hx or h/o childhood pna/ asthma or knowledge of premature birth.  Sleeping ok without nocturnal  or early am exacerbation  of respiratory  c/o's or need for noct saba. Also denies any obvious fluctuation of symptoms with weather or environmental changes or other aggravating or alleviating factors except as outlined above   Current Medications, Allergies, Complete Past Medical History, Past Surgical History, Family History, and Social History were reviewed in [facility] electronic medical record.           Review of Systems  Constitutional: Negative for fever, chills, activity change, appetite change and unexpected weight change.  HENT: Negative for congestion, dental problem, postnasal drip, rhinorrhea, sneezing, sore throat, trouble swallowing and voice change.   Eyes: Negative for visual disturbance.  Respiratory: Positive for cough and shortness of breath. Negative for choking.   Cardiovascular: Positive for leg swelling. Negative for chest pain.  Gastrointestinal: Negative for nausea, vomiting and abdominal pain.  Genitourinary: Negative for  difficulty urinating.       Acid heartburn  Indigestion  Musculoskeletal: Negative for arthralgias.  Skin: Negative for rash.  Psychiatric/Behavioral: Negative for behavioral problems and confusion.       Objective:   Physical Exam  amb wm nad   Wt Readings from Last 3 Encounters:  01/21/15 144 lb 12.8 oz (65.681 kg)  01/07/15 146 lb (66.225 kg)  01/04/15 146 lb (66.225 kg)    Vital signs reviewed  HEENT: nl dentition, turbinates, and orophanx. Nl external ear canals without cough reflex   NECK :  without JVD/Nodes/TM/ nl carotid upstrokes bilaterally   LUNGS: no acc muscle use, subtle insp crackles both bases without cough on insp or exp maneuvers   CV:  RRR  no s3 or murmur or increase in P2, no edema   ABD:  soft and nontender with nl excursion in the supine position. No bruits or organomegaly, bowel sounds nl  MS:  warm without deformities, calf tenderness, cyanosis or clubbing  SKIN: warm and dry without lesions    NEURO:  alert, approp, no deficits      I personally reviewed images and agree with radiology impression as follows:  CXR:  12/24/14 Again noted bilateral reticular interstitial prominence with slight worsening from prior exam especially in lower lobes. Findings suspicious for mild edema or pneumonitis superimposed on chronic interstitial lung disease. Question bilateral small pleural effusion or basilar infiltrate.    Labs ordered/ reviewed:    Lab Results  Component Value Date   TSH  2.64 01/21/2015     Lab Results  Component Value Date   PROBNP 765.0* 01/21/2015     Lab Results  Component Value Date   ESRSEDRATE 52* 01/21/2015          Assessment & Plan:

## 2015-01-22 ENCOUNTER — Encounter: Payer: Self-pay | Admitting: Internal Medicine

## 2015-01-22 DIAGNOSIS — J8410 Pulmonary fibrosis, unspecified: Secondary | ICD-10-CM | POA: Insufficient documentation

## 2015-01-22 NOTE — Assessment & Plan Note (Signed)
-   01/21/2015  Walked RA x 3 laps @ 185 ft each stopped due to  desat to 85 % slow pace/sob  - spirometry 01/21/2015  Minimal airflow obst > d/c spiriva/ continue symbicort    Clearly there is only a mild component of this problem related to copd so ok to stop spiriva (see copd)/ may need 24 02 if not improving off amiodarone (see pf)

## 2015-01-22 NOTE — Assessment & Plan Note (Signed)
-  spirometry 01/21/2015   mostly restrictive with ESR  52  - trial off amiodarone 01/22/2015 >>>  - start daily pred 01/24/15     The dx of amiodarone assoc pf is purely clinical and based on exposure hx which extends back sev years and toxicity increases with age/ the high esr is suggestive of an active inflammatory component so rec  Trial off amio (will message dr Mare Ferrari for alternative)  The goal with a chronic steroid dependent illness is always arriving at the lowest effective dose that controls the disease/symptoms and not accepting a set "formula" which is based on statistics or guidelines that don't always take into account patient  variability or the natural hx of the dz in every individual patient, which may well vary over time.  For now therefore I recommend the patient maintain  20 mg until better and 10 mg per day until return with full pfts at which point the amiodarone will still be on board but perhaps partially offset by the prednisone rx in low doses

## 2015-01-22 NOTE — Assessment & Plan Note (Signed)
-   spirometry 01/21/2015  Minimal airflow obst > d/c spiriva/ continue symbicort

## 2015-01-24 ENCOUNTER — Telehealth: Payer: Self-pay | Admitting: *Deleted

## 2015-01-24 NOTE — Telephone Encounter (Signed)
LMTCB for the pt 

## 2015-01-24 NOTE — Telephone Encounter (Signed)
-----   Message from Nyoka CowdenMichael B Wert, MD sent at 01/22/2015  8:34 AM EDT ----- On basis of labs rec pred 10 mg x 2 at bfast until better then 1 daily

## 2015-01-25 ENCOUNTER — Encounter: Payer: Self-pay | Admitting: Family Medicine

## 2015-01-25 ENCOUNTER — Ambulatory Visit (INDEPENDENT_AMBULATORY_CARE_PROVIDER_SITE_OTHER): Payer: Medicare Other | Admitting: Family Medicine

## 2015-01-25 VITALS — BP 130/68 | HR 60 | Temp 97.6°F | Resp 18 | Ht 66.5 in | Wt 146.0 lb

## 2015-01-25 DIAGNOSIS — J449 Chronic obstructive pulmonary disease, unspecified: Secondary | ICD-10-CM | POA: Diagnosis not present

## 2015-01-25 DIAGNOSIS — N184 Chronic kidney disease, stage 4 (severe): Secondary | ICD-10-CM | POA: Diagnosis not present

## 2015-01-25 DIAGNOSIS — E119 Type 2 diabetes mellitus without complications: Secondary | ICD-10-CM | POA: Diagnosis not present

## 2015-01-25 DIAGNOSIS — I480 Paroxysmal atrial fibrillation: Secondary | ICD-10-CM | POA: Diagnosis not present

## 2015-01-25 MED ORDER — PREDNISONE 20 MG PO TABS: 20.0000 mg | ORAL_TABLET | Freq: Every day | ORAL | Status: DC

## 2015-01-25 NOTE — Telephone Encounter (Signed)
Pt aware of the below recs per MW  Nothing further needed

## 2015-01-25 NOTE — Progress Notes (Signed)
Subjective:    Patient ID: Mark [name], male    DOB: [birth year], 69 y.o.   MRN: [id number]  HPI  12/09/14 Patient has COPD. He is currently on Symbicort as well as Spiriva. Over the last week he has developed a cough productive of thick yellow mucus. He reports chest congestion. He reports shortness of breath. His pulse oximetry earlier in the week was in the 70s. He states that he is slightly better today is his pulse oximetry has come up to 88% on room air. He denies chest pain. He does report shortness of breath. He refuses to go the hospital. He also refuses oxygen. Patient states that his symptoms are previous to similar COPD exacerbations. Today on examination there is no evidence of peripheral edema so she is has had in the past with congestive heart failure. On lung exam today he has markedly diminished breath sounds with occasional expiratory wheezes but no crackles Rales. He has no JVD. His weight is stable from his last office visit. There is no evidence of fluid overload on exam.  At that time, my plan was: I believe the patient is having a COPD exacerbation. I will start the patient on prednisone 40 mg a day for 7 days. I'll also cover the patient with azithromycin 500 mg by mouth daily one, 250 mg by mouth daily as 2 through 5. I am aware of the mornings of QTC prolongation on amiodarone however the patient has tolerated this combination in the past and has worked well for him. He can use albuterol 2 puffs inhaled every 6 hours as needed. Continue his breathing Symbicort and recheck Monday or immediately if worse  12/13/14 Patient is doing much better. Today he is 90-91% on room air versus 86%. His breathing is better. He is using his Symbicort and Spiriva on a daily basis. He is taking the prednisone and antibiotics. He has not been using his albuterol at all. He continues to have a cough productive of yellow and thick sputum. He continues to have some mild shortness of breath. He  denies any chest pain or orthopnea.  At taht time, my plan was:  complete prednisone. Complete Zithromax. I instructed the patient I want him to liberalize his use of albuterol. He is to use 2 puffs every 6 hours for the next few days to help fight the bronchospasm, increase his lung volumes, and help his shortness of breath. Continue his breathing Symbicort. Recheck on Friday or sooner if worse  12/17/14 Breathing is much better. Pulse oximetry was 96% on room air. Patient is no longer wheezing. Chest x-ray shows patchy densities in both lungs consistent with mild infiltrate versus CHF. I believe it is due to infiltrate. There is no JVD. There is no peripheral edema. The patient's weight is stable. There is no evidence of decompensated congestive heart failure. Therefore I believe the infiltrate should improve with time after treating him with antibiotics and steroids.  At that time, my plan was:  Patient is clinically improving. I will recheck the patient in one month. At that time I'll repeat a chest x-ray. Return immediately if symptoms worsen. I see no evidence requiring diuresis at the present time. I believe the infiltrate should improve given time after treatment with Zithromax and steroids. Therefore I'll repeat a chest x-ray in one month..  01/04/15  Unfortunately, after the patient had his inguinal hernia surgery, he was admitted to the hospital with shortness of breath. 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 Patient was treated with doxycycline, a 10 day taper of prednisone, and bronchodilators. Patient was also diuresed in the hospital. Today he is here for follow-up. His cardiologist is recommended a pulmonary consultation to help prevent future hospitalizations. Patient is currently on oxygen at night, Symbicort, Spiriva, and daily Mucinex.  Patient states that his breathing had improved  dramatically after the hospitalization. Unfortunately he made his yard yesterday and was covered in dust and pollen. Today he is audibly wheezing on examination. His pulse oximetry is 90-91% on room air. He is audibly wheezing on examination today. He is not using his albuterol. His last albuterol treatment was more than 12 hours ago. He denies any fever. He denies any productive cough. He does complain of chest congestion.  At that time, my plan was:  I will gladly schedule the patient to see a pulmonologist for second opinion. At this point the only other option I see for treatment of this patient would be either adding daliresp vs zithromax 500 mg 3 times a week due to his bronchoiectasis based on the EMBRACE trial.  I once again emphasized to the patient that he needs to use his albuterol more frequently. I recommended every 6 hours as needed for wheezing. Certainly today he is wheezing. I recommended he go home and liberalize his use of albuterol to try to help treat the bronchospasms that lead to the COPD exacerbations that he is having. I will also add Xyzal 5 mg a day for seasonal allergies to hopefully help treat the trigger which seems to be causing this. I will repeat a CMP as well as a hemoglobin A1c today to monitor the management of his diabetes particular given his frequent use of oral corticosteroids.    I have asked the patient to recheck on Friday given the abnormalities on his pulmonary examination today. His wheezing is worsening, I would resume prednisone for COPD exacerbation as well as start the patient on Zithromax. I am aware of the QTC prolongation coupling Zithromax with amiodarone however the patient has tolerated this well in the past and has responded well to this antibiotic regimen.  01/07/15 Patient is doing much better today. Oxygen saturations are 98% on room air. The rhonchorous breath sounds at patient had earlier this week have improved dramatically. His wheezing has also  improved. He continues to complain of mucus in his airways. However, he is using his albuterol every 6 hours and also taking his Mucinex. He denies any fever or chills or purulent sputum. He is scheduled to see the pulmonologist May 6..At that time, my plan was: At the present time there is no indication for steroids. I do not feel that the patient requires antibiotics at the present time. I will defer further treatment decisions to the pulmonologist whom he is seeing in early May. The patient may likely benefit from pulse dose antibiotics on a scheduled basis due to his bronchiectasis but I would defer to their judgment.  01/25/15 Recently saw Dr. Sherene SiresWert who recommended he stop spiriva and continue symbicort.  He was also going to discuss with Dr. Patty SermonsBrackbill changing vs D/C amiodarone.  He also recommended staying on prednisone.  Patient is here for follow up.  Overall his breathing is subjectively better. He is 95% on room air at rest. He denies cough. He denies wheezing. He does still continue have congestion in his chest but this does feel  better. He is not currently taking prednisone because he did not receive a prescription at the pulmonologist office  And he needs me to prescribe this. I last checked his hemoglobin A1c and found to be 7 earlier in April which is acceptable given his age and his medical comorbidities. His  Creatinine was stable at 2.4. He is off the amiodarone. He is on no other antiarrhythmic at the present time. He is in normal sinus rhythm today Past Medical History  Diagnosis Date  . GERD (gastroesophageal reflux disease)   . Hyperlipidemia   . H/O hiatal hernia   . Hypertension     Sees Dr. Lynnea Ferrier  . AAA (abdominal aortic aneurysm)     a. 09/2013: Resection and grafting of abdominal aortic aneurysm with insertion of an aorto by common iliac graft using 18 x 9 mm Hemashield Dacron graft.  Marland Kitchen COPD (chronic obstructive pulmonary disease)   . Chronic diastolic CHF (congestive  heart failure)     a.  Echo (08/13/2013): EF 55-60%, normal wall motion, mild MR, mild LAE  . Pneumonia   . Arthritis     "little in my hands" (09/08/2013)  . Chronic kidney disease (CKD), stage III (moderate)   . PAF (paroxysmal atrial fibrillation)     a. amiodarone Rx started 08/2013;  b. s/p TEE-DCCV 09/2013 => recurrent AFib => repeat DCCV => NSR;   c. Xarelto 15 QD due to CKD  . Hx of cardiovascular stress test     a. Myoview 09/2012:  no scar or ischemia  . 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 Knee arthroscopy Right   . Abdominal aortic aneurysm repair  10/15/2012    Procedure: ANEURYSM ABDOMINAL AORTIC REPAIR;  Surgeon: Pryor Ochoa, MD;  Location: [facility] OR;  Service: Vascular;  Laterality: N/A;  Resection and Grafting of Abdominal Aortic Aneurysm - Aortobi-Iliac   . Tee without cardioversion N/A 09/22/2013    Procedure: TRANSESOPHAGEAL ECHOCARDIOGRAM (TEE);  Surgeon: Lars Masson, MD;  Location: [facility] ENDOSCOPY;  Service: Cardiovascular;  Laterality: N/A;  . Cardioversion N/A 09/22/2013    Procedure: CARDIOVERSION;  Surgeon: Lars Masson, MD;  Location: [facility] ENDOSCOPY;  Service: Cardiovascular;  Laterality: N/A;  . Abdominal aortic aneurysm repair  10-15-2012  . Cardioversion  Jan. 12, 2015    Dr. Doylene Canning. Ladona Ridgel  . Cardioversion N/A 09/28/2013    Procedure: CARDIOVERSION;  Surgeon: Marinus Maw, MD;  Location: [facility] CATH LAB;  Service: Cardiovascular;  Laterality: N/A;  . Inguinal hernia repair Right 12/22/2014    Procedure: OPEN RIGHT INGUINAL HERNIA REPAIR WITH MESH;  Surgeon: Axel Filler, MD;  Location: [facility] ORS;  Service: General;  Laterality: Right;  . Insertion of mesh N/A 12/22/2014    Procedure: INSERTION OF MESH;  Surgeon: Axel Filler, MD;  Location: [facility] ORS;  Service: General;  Laterality: N/A;   Current Outpatient Prescriptions on File Prior to Visit  Medication Sig Dispense Refill  . albuterol  (VENTOLIN HFA) 108 (90 BASE) MCG/ACT inhaler Inhale 2 puffs into the lungs every 6 (six) hours as needed for wheezing or shortness of breath. 1 Inhaler 6  . aspirin 81 MG tablet Take 81 mg by mouth daily.    Marland Kitchen atorvastatin (LIPITOR) 10 MG tablet Take 1 tablet (10 mg total) by mouth daily. 90 tablet 3  . budesonide-formoterol (SYMBICORT) 160-4.5 MCG/ACT inhaler Inhale 2 puffs into the lungs 2 (two) times daily. 1  Inhaler 11  . furosemide (LASIX) 40 MG tablet Take 1 tablet (40 mg total) by mouth daily. 60 tablet 5  . levocetirizine (XYZAL) 5 MG tablet Take 1 tablet (5 mg total) by mouth every evening. 30 tablet 2  . metoprolol succinate (TOPROL-XL) 25 MG 24 hr tablet TAKE 3 TABLETS BY MOUTH EVERY DAY TAKE WITH OR IMMEDIATELY AFTER BREAKFAST 90 tablet 11  . oxyCODONE-acetaminophen (ROXICET) 5-325 MG per tablet Take 1-2 tablets by mouth every 4 (four) hours as needed for severe pain. 30 tablet 0  . ranitidine (ZANTAC) 150 MG tablet Take 150 mg by mouth daily as needed for heartburn.    . tamsulosin (FLOMAX) 0.4 MG CAPS capsule Take 1 capsule (0.4 mg total) by mouth daily. (Patient taking differently: Take 0.4 mg by mouth at bedtime. ) 30 capsule 3  . TAZTIA XT 360 MG 24 hr capsule TAKE 1 TABLET EVERY DAY 30 capsule 11  . TRADJENTA 5 MG TABS tablet TAKE 1 TABLET EVERY DAY 30 tablet 1   No current facility-administered medications on file prior to visit.   Allergies  Allergen Reactions  . Niaspan [Niacin Er] Anaphylaxis   History   Social History  . Marital Status: Married    Spouse Name: Nicole Cella  . Number of Children: 4  . Years of Education: N/A   Occupational History  . Works PT as a Education administrator    Social History Main Topics  . Smoking status: Former Smoker -- 1.00 packs/day for 56 years    Types: Cigarettes    Quit date: 07/31/2013  . Smokeless tobacco: Former Neurosurgeon    Types: Chew  . Alcohol Use: No  . Drug Use: No  . Sexual Activity: Not Currently   Other Topics Concern  . Not on  file   Social History Narrative   Married.  Lives with wife.  Ambulates without assistance.     Review of Systems  All other systems reviewed and are negative.      Objective:   Physical Exam  Constitutional: He appears well-developed and well-nourished. No distress.  Neck: Neck supple. No JVD present.  Cardiovascular: Normal rate, regular rhythm and normal heart sounds.   Pulmonary/Chest: No accessory muscle usage. No tachypnea. No respiratory distress. He has decreased breath sounds. He has no wheezes.  Abdominal: Soft. Bowel sounds are normal.  Musculoskeletal: He exhibits no edema.  Lymphadenopathy:    He has no cervical adenopathy.  Skin: He is not diaphoretic.  Vitals reviewed.         Assessment & Plan:  COPD (chronic obstructive pulmonary disease) with chronic bronchitis - Plan: predniSONE (DELTASONE) 20 MG tablet  Diabetes mellitus type II, controlled  Paroxysmal atrial fibrillation  CKD (chronic kidney disease) stage 4, GFR 15-29 ml/min  I will prescribe the patient 20 mg of prednisone per day. 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 He will continue 20 mg of prednisone a day until seen in follow-up by his pulmonologist in one month. At that time I will defer to the pulmonologist whether or not to wean the patient down on the prednisone further. If the patient continues on high-dose steroids we will likely need to increase his diabetic medication. I would like to see the patient back in 3 months to repeat a hemoglobin A1c as well as monitor his renal function. At the present time he will continue an aspirin for primary stroke prevention. He has had one documented instance of paroxysmal atrial  fibrillation. This was in the midst of a hospitalization with hypoxia and severe COPD exacerbation.  I am not sure that he requires further antiarrhythmics. I will defer this to his cardiologist. At the present time I  recommended we discontinue the aspirin. Recheck in 3 months or sooner if worse

## 2015-01-27 ENCOUNTER — Telehealth: Payer: Self-pay | Admitting: Family Medicine

## 2015-01-27 MED ORDER — ATORVASTATIN CALCIUM 10 MG PO TABS: 10.0000 mg | ORAL_TABLET | Freq: Every day | ORAL | Status: DC

## 2015-01-27 NOTE — Telephone Encounter (Signed)
[phone] PT is needing a refill on atorvastatin (LIPITOR) 10 MG tablet (he states that the health dept gave him crestor last year and he has ran out of that but we have been giving him the lipitor)  [facility]

## 2015-01-27 NOTE — Telephone Encounter (Signed)
Medication called/sent to requested pharmacy  

## 2015-02-15 ENCOUNTER — Telehealth: Payer: Self-pay | Admitting: Family Medicine

## 2015-02-15 MED ORDER — BUDESONIDE-FORMOTEROL FUMARATE 160-4.5 MCG/ACT IN AERO: 2.0000 | INHALATION_SPRAY | Freq: Two times a day (BID) | RESPIRATORY_TRACT | Status: DC

## 2015-02-15 NOTE — Telephone Encounter (Signed)
Patient would like refill on his Symbicort if possible [phone]

## 2015-02-15 NOTE — Telephone Encounter (Signed)
Medication called/sent to requested pharmacy  

## 2015-02-22 ENCOUNTER — Ambulatory Visit (INDEPENDENT_AMBULATORY_CARE_PROVIDER_SITE_OTHER): Payer: Medicare Other | Admitting: Internal Medicine

## 2015-02-22 ENCOUNTER — Encounter: Payer: Self-pay | Admitting: Internal Medicine

## 2015-02-22 VITALS — BP 120/60 | HR 63 | Ht 66.5 in | Wt 152.0 lb

## 2015-02-22 DIAGNOSIS — J449 Chronic obstructive pulmonary disease, unspecified: Secondary | ICD-10-CM | POA: Diagnosis not present

## 2015-02-22 DIAGNOSIS — J8410 Pulmonary fibrosis, unspecified: Secondary | ICD-10-CM | POA: Diagnosis not present

## 2015-02-22 DIAGNOSIS — R0600 Dyspnea, unspecified: Secondary | ICD-10-CM

## 2015-02-22 LAB — PULMONARY FUNCTION TEST
DL/VA % PRED: 57 %
DL/VA: 2.52 ml/min/mmHg/L
DLCO UNC % PRED: 40 %
DLCO unc: 11.14 ml/min/mmHg
FEF 25-75 POST: 0.9 L/s
FEF 25-75 Pre: 0.51 L/sec
FEF2575-%CHANGE-POST: 77 %
FEF2575-%PRED-PRE: 23 %
FEF2575-%Pred-Post: 42 %
FEV1-%Change-Post: 23 %
FEV1-%PRED-POST: 51 %
FEV1-%Pred-Pre: 41 %
FEV1-Post: 1.45 L
FEV1-Pre: 1.17 L
FEV1FVC-%CHANGE-POST: 5 %
FEV1FVC-%Pred-Pre: 76 %
FEV6-%CHANGE-POST: 16 %
FEV6-%Pred-Post: 67 %
FEV6-%Pred-Pre: 57 %
FEV6-POST: 2.42 L
FEV6-PRE: 2.08 L
FEV6FVC-%Change-Post: 0 %
FEV6FVC-%PRED-PRE: 104 %
FEV6FVC-%Pred-Post: 104 %
FVC-%Change-Post: 16 %
FVC-%PRED-PRE: 55 %
FVC-%Pred-Post: 64 %
FVC-Post: 2.46 L
FVC-Pre: 2.11 L
POST FEV1/FVC RATIO: 59 %
POST FEV6/FVC RATIO: 98 %
Pre FEV1/FVC ratio: 55 %
Pre FEV6/FVC Ratio: 98 %
RV % PRED: 113 %
RV: 2.58 L
TLC % pred: 74 %
TLC: 4.74 L

## 2015-02-22 MED ORDER — PREDNISONE 10 MG PO TABS: ORAL_TABLET | ORAL | Status: DC

## 2015-02-22 MED ORDER — BISOPROLOL FUMARATE 5 MG PO TABS
5.0000 mg | ORAL_TABLET | Freq: Every day | ORAL | Status: DC
Start: 2015-02-22 — End: 2015-04-18

## 2015-02-22 NOTE — Progress Notes (Signed)
Subjective:    Patient ID: Mark [name], male    DOB: 1946-06-08,   MRN: [id number]    Brief patient profile:  [id number] [id number] quit smoking 12/2012 with doe then that has variably  worsened since  so referred by Dr Tanya NonesPickard to pulmonary clinic 01/21/15  History of Present Illness  01/21/2015 1st [facility] office visit/ Alasha Mcguinness  On amiodarone/ symb/spiriva with min airflow obst on pfts 01/21/15  Chief Complaint  Patient presents with  . Pulmonary Consult    Referred by Dr. Lynnea FerrierWarren Pickard. Pt states he was dxed with COPD. He c/o SOB and cough for the past yr.  He gets SOB walking 200 ft on a flat suface. Cough is non prod most of the time.   onset of doe and dry cough indolent / progressive while on amiodarone for afib/  just started 02 at hs  rec Stop amiodarone today and spiriva for now  Continue symbicort Take 2 puffs first thing in am and then another 2 puffs about 12 hours later Only use your albuterol (ventolin)  as a rescue medication  pred 10 mg 2 daily with bast until improving and then 1 daily until return    02/22/2015 f/u ov/Delanna Blacketer re: ? amio ild/ gold III copd with reversibility  Chief Complaint  Patient presents with  . Follow-up    PFT done today. Pt states breathing is doing better. He states that he can walk twice as far without having to stop. Using albuterol 1 x per wk on average.      No obvious day to day or daytime variabilty or assoc chronic cough or cp or chest tightness, subjective wheeze overt sinus or hb symptoms. No unusual exp hx or h/o childhood pna/ asthma or knowledge of premature birth.  Sleeping ok without nocturnal  or early am exacerbation  of respiratory  c/o's or need for noct saba. Also denies any obvious fluctuation of symptoms with weather or environmental changes or other aggravating or alleviating factors except as outlined above   Current Medications, Allergies, Complete Past Medical History, Past Surgical History, Family History, and Social History  were reviewed in [facility] electronic medical record.  ROS  The following are not active complaints unless bolded sore throat, dysphagia, dental problems, itching, sneezing,  nasal congestion or excess/ purulent secretions, ear ache,   fever, chills, sweats, unintended wt loss, pleuritic or exertional cp, hemoptysis,  orthopnea pnd or leg swelling, presyncope, palpitations, heartburn, abdominal pain, anorexia, nausea, vomiting, diarrhea  or change in bowel or urinary habits, change in stools or urine, dysuria,hematuria,  rash, arthralgias, visual complaints, headache, numbness weakness or ataxia or problems with walking or coordination,  change in mood/affect or memory.            Objective:   Physical Exam  amb wm nad   02/22/15              152  Wt Readings from Last 3 Encounters:  01/21/15 144 lb 12.8 oz (65.681 kg)  01/07/15 146 lb (66.225 kg)  01/04/15 146 lb (66.225 kg)    Vital signs reviewed  HEENT: nl dentition, turbinates, and orophanx. Nl external ear canals without cough reflex   NECK :  without JVD/Nodes/TM/ nl carotid upstrokes bilaterally   LUNGS: no acc muscle use, subtle insp crackles both bases without cough on insp or exp maneuvers   CV:  RRR  no s3 or murmur or increase in P2, no edema   ABD:  soft  and nontender with nl excursion in the supine position. No bruits or organomegaly, bowel sounds nl  MS:  warm without deformities, calf tenderness, cyanosis or clubbing  SKIN: warm and dry without lesions    NEURO:  alert, approp, no deficits      I personally reviewed images and agree with radiology impression as follows:  CXR:  12/24/14 Again noted bilateral reticular interstitial prominence with slight worsening from prior exam especially in lower lobes. Findings suspicious for mild edema or pneumonitis superimposed on chronic interstitial lung disease. Question bilateral small pleural effusion or basilar infiltrate.    Labs ordered/ reviewed:     Lab Results  Component Value Date   TSH 2.64 01/21/2015     Lab Results  Component Value Date   PROBNP 765.0* 01/21/2015     Lab Results  Component Value Date   ESRSEDRATE 52* 01/21/2015          Assessment & Plan:   Outpatient Encounter Prescriptions as of 02/22/2015  Medication Sig  . albuterol (VENTOLIN HFA) 108 (90 BASE) MCG/ACT inhaler Inhale 2 puffs into the lungs every 6 (six) hours as needed for wheezing or shortness of breath.  Marland Kitchen aspirin 81 MG tablet Take 81 mg by mouth daily.  Marland Kitchen atorvastatin (LIPITOR) 10 MG tablet Take 1 tablet (10 mg total) by mouth daily.  . budesonide-formoterol (SYMBICORT) 160-4.5 MCG/ACT inhaler Inhale 2 puffs into the lungs 2 (two) times daily.  . furosemide (LASIX) 40 MG tablet Take 1 tablet (40 mg total) by mouth daily.  Marland Kitchen levocetirizine (XYZAL) 5 MG tablet Take 1 tablet (5 mg total) by mouth every evening.  . ranitidine (ZANTAC) 150 MG tablet Take 150 mg by mouth daily as needed for heartburn.  . tamsulosin (FLOMAX) 0.4 MG CAPS capsule Take 1 capsule (0.4 mg total) by mouth daily. (Patient taking differently: Take 0.4 mg by mouth at bedtime. )  . TAZTIA XT 360 MG 24 hr capsule TAKE 1 TABLET EVERY DAY  . TRADJENTA 5 MG TABS tablet TAKE 1 TABLET EVERY DAY  . [DISCONTINUED] metoprolol succinate (TOPROL-XL) 25 MG 24 hr tablet TAKE 3 TABLETS BY MOUTH EVERY DAY TAKE WITH OR IMMEDIATELY AFTER BREAKFAST  . [DISCONTINUED] predniSONE (DELTASONE) 20 MG tablet Take 1 tablet (20 mg total) by mouth daily with breakfast.  . bisoprolol (ZEBETA) 5 MG tablet Take 1 tablet (5 mg total) by mouth daily.  . predniSONE (DELTASONE) 10 MG tablet 1 daily x one week then one half daily x one week and stop   No facility-administered encounter medications on file as of 02/22/2015.

## 2015-02-22 NOTE — Patient Instructions (Addendum)
Stop metaprolol and take bisoprol 5 mg one daily   Work on inhaler technique:  relax and gently blow all the way out then take a nice smooth deep breath back in, triggering the inhaler at same time you start breathing in.  Hold for up to 5 seconds if you can.  Rinse and gargle with water when done   Stay on symbicort 160  Take 2 puffs first thing in am and then another 2 puffs about 12 hours later.   Prednisone 10 mg one per day x one week then half a pill x one week and off    If you are satisfied with your treatment plan,  let your doctor know and he/she can either refill your medications or you can return here when your prescription runs out.     If in any way you are not 100% satisfied,  please tell us.  If 100% better, tell your friends!  Pulmonary follow up is as needed

## 2015-02-22 NOTE — Progress Notes (Signed)
PFT done today. 

## 2015-02-27 ENCOUNTER — Encounter: Payer: Self-pay | Admitting: Internal Medicine

## 2015-02-27 NOTE — Assessment & Plan Note (Signed)
-  spirometry 01/21/2015   mostly restrictive   - trial off amiodarone 01/22/2015 >>>  - start daily pred 01/24/15 with ESR = 52 baseline  - full pfts 01/22/2015 >  VC 2.16 with dlco 40 on pred 20 mg per day    The goal with a chronic steroid dependent illness is always arriving at the lowest effective dose that controls the disease/symptoms and not accepting a set "formula" which is based on statistics or guidelines that don't always take into account patient  variability or the natural hx of the dz in every individual patient, which may well vary over time.  For now therefore I recommend the patient  tperoff pred x 2 weeks if tolerates as have not made a specific dx of amiodarone lung and will have been off amio x one month by then.

## 2015-02-27 NOTE — Assessment & Plan Note (Signed)
-   spirometry 01/21/2015  Minimal airflow obst > d/c spiriva/ continue symbicort  - 02/22/2015 p extensive coaching HFA effectiveness =    75%  - PFTs 02/22/2015  FEV1 1.45  (51%) ratio 59 after 23% resp to saba p am symbicort and dlco 40% and corrects to 57 and symptomatically better off spiriva and on prednisone 10 mg daily for ? amio lung    I had an extended discussion with the patient reviewing all relevant studies completed to date and  lasting 15 to 20 minutes of a 25 minute visit on the following ongoing concerns:   1) has much more reversibility than previously appreciated so symbicort 160 perfect choice  2) The proper method of use, as well as anticipated side effects, of a metered-dose inhaler are discussed and demonstrated to the patient. Improved effectiveness after extensive coaching during this visit to a level of approximately  75% but could still improve further with practice  3) if not staying better to his satisfaction ok to add back spiriva   4) Each maintenance medication was reviewed in detail including most importantly the difference between maintenance and as needed and under what circumstances the prns are to be used.  Please see instructions for details which were reviewed in writing and the patient given a copy.

## 2015-03-14 ENCOUNTER — Other Ambulatory Visit: Payer: Self-pay | Admitting: Family Medicine

## 2015-03-16 ENCOUNTER — Other Ambulatory Visit: Payer: Self-pay | Admitting: Family Medicine

## 2015-03-17 ENCOUNTER — Telehealth: Payer: Self-pay | Admitting: Family Medicine

## 2015-03-17 NOTE — Telephone Encounter (Signed)
[phone] PT called and left VM that he was needing a refill on TRADJENTA 5 MG TABS tablet [facility]

## 2015-03-17 NOTE — Telephone Encounter (Signed)
RX was sent 6/27 and 6/29 with confirmation of receipt

## 2015-04-01 ENCOUNTER — Other Ambulatory Visit: Payer: Self-pay | Admitting: Family Medicine

## 2015-04-01 NOTE — Telephone Encounter (Signed)
Medication refilled per protocol. 

## 2015-04-18 ENCOUNTER — Encounter: Payer: Self-pay | Admitting: Family Medicine

## 2015-04-18 ENCOUNTER — Ambulatory Visit (INDEPENDENT_AMBULATORY_CARE_PROVIDER_SITE_OTHER): Payer: Commercial Managed Care - HMO | Admitting: Family Medicine

## 2015-04-18 VITALS — BP 142/80 | HR 60 | Temp 97.8°F | Resp 20 | Ht 66.5 in | Wt 146.0 lb

## 2015-04-18 DIAGNOSIS — E119 Type 2 diabetes mellitus without complications: Secondary | ICD-10-CM

## 2015-04-18 DIAGNOSIS — Z23 Encounter for immunization: Secondary | ICD-10-CM

## 2015-04-18 DIAGNOSIS — I5032 Chronic diastolic (congestive) heart failure: Secondary | ICD-10-CM | POA: Diagnosis not present

## 2015-04-18 DIAGNOSIS — N184 Chronic kidney disease, stage 4 (severe): Secondary | ICD-10-CM | POA: Diagnosis not present

## 2015-04-18 DIAGNOSIS — J449 Chronic obstructive pulmonary disease, unspecified: Secondary | ICD-10-CM

## 2015-04-18 DIAGNOSIS — I480 Paroxysmal atrial fibrillation: Secondary | ICD-10-CM

## 2015-04-18 LAB — COMPLETE METABOLIC PANEL WITH GFR
ALK PHOS: 69 U/L (ref 40–115)
ALT: 9 U/L (ref 9–46)
AST: 12 U/L (ref 10–35)
Albumin: 3.7 g/dL (ref 3.6–5.1)
BILIRUBIN TOTAL: 0.4 mg/dL (ref 0.2–1.2)
BUN: 30 mg/dL — ABNORMAL HIGH (ref 7–25)
CO2: 25 mmol/L (ref 20–31)
CREATININE: 2.66 mg/dL — AB (ref 0.70–1.18)
Calcium: 9.1 mg/dL (ref 8.6–10.3)
Chloride: 103 mmol/L (ref 98–110)
GFR, EST AFRICAN AMERICAN: 27 mL/min — AB (ref >=60)
GFR, Est Non African American: 23 mL/min — ABNORMAL LOW (ref >=60)
GLUCOSE: 103 mg/dL — AB (ref 70–99)
Potassium: 4 mmol/L (ref 3.5–5.3)
SODIUM: 139 mmol/L (ref 135–146)
Total Protein: 6.5 g/dL (ref 6.1–8.1)

## 2015-04-18 LAB — LIPID PANEL
Cholesterol: 139 mg/dL (ref 125–200)
HDL: 52 mg/dL (ref >=40)
LDL CALC: 73 mg/dL (ref <130)
Total CHOL/HDL Ratio: 2.7 Ratio (ref <=5.0)
Triglycerides: 71 mg/dL (ref <150)
VLDL: 14 mg/dL (ref <30)

## 2015-04-18 LAB — HEMOGLOBIN A1C
Hgb A1c MFr Bld: 6.6 % — ABNORMAL HIGH (ref <5.7)
Mean Plasma Glucose: 143 mg/dL — ABNORMAL HIGH (ref <117)

## 2015-04-18 LAB — CBC WITH DIFFERENTIAL/PLATELET
BASOS PCT: 1 % (ref 0–1)
Basophils Absolute: 0.1 10*3/uL (ref 0.0–0.1)
EOS ABS: 0.4 10*3/uL (ref 0.0–0.7)
EOS PCT: 4 % (ref 0–5)
HCT: 38.2 % — ABNORMAL LOW (ref 39.0–52.0)
Hemoglobin: 12.6 g/dL — ABNORMAL LOW (ref 13.0–17.0)
Lymphocytes Relative: 25 % (ref 12–46)
Lymphs Abs: 2.2 10*3/uL (ref 0.7–4.0)
MCH: 29.4 pg (ref 26.0–34.0)
MCHC: 33 g/dL (ref 30.0–36.0)
MCV: 89 fL (ref 78.0–100.0)
MONO ABS: 0.9 10*3/uL (ref 0.1–1.0)
MPV: 9 fL (ref 8.6–12.4)
Monocytes Relative: 10 % (ref 3–12)
NEUTROS PCT: 60 % (ref 43–77)
Neutro Abs: 5.3 10*3/uL (ref 1.7–7.7)
Platelets: 197 10*3/uL (ref 150–400)
RBC: 4.29 MIL/uL (ref 4.22–5.81)
RDW: 16.9 % — ABNORMAL HIGH (ref 11.5–15.5)
WBC: 8.9 10*3/uL (ref 4.0–10.5)

## 2015-04-18 NOTE — Progress Notes (Signed)
Subjective:    Patient ID: Mark [name], male    DOB: 06/26/1946, 69 y.o.   MRN: [id number]  HPI Physical very pleasant 69 year old white male who has a history of COPD, congestive heart failure which is diastolic in nature, chronic kidney disease stage IV, diabetes mellitus, and hypertension. Ever since the patient discontinued his amiodarone, the patient's breathing has continued to improve. He has not had any recent "COPD exacerbations". I was frequently seen the patient for steroid-dependent COPD exacerbations in the past. This has improved dramatically. Patient states he is not even using his oxygen at home. Today he is 98% on room air. With ambulation for more than 200 feet, the patient's oxygen drops to 92% on room air. Patient no longer requires oxygen. His heart rate is in normal sinus rhythm. He is currently only taking an aspirin for stroke prevention. The patient has a history of paroxysmal atrial fibrillation however his one episode was in the hospital coupled with a respiratory infection. Since that time he has had no documented evidence of A. Fib. Because of his chronic kidney disease, the patient cannot tolerate other anticoagulates and therefore he was started on aspirin. His heart rate is well controlled today at 60 bpm. His blood pressure is borderline at 142/80. He is overdue for a check of his diabetes as well as a recheck of his kidney function and his potassium. He is also due to check his cholesterol. Past Medical History  Diagnosis Date  . GERD (gastroesophageal reflux disease)   . Hyperlipidemia   . H/O hiatal hernia   . Hypertension     Sees Dr. Lynnea Ferrier  . AAA (abdominal aortic aneurysm)     a. 09/2013: Resection and grafting of abdominal aortic aneurysm with insertion of an aorto by common iliac graft using 18 x 9 mm Hemashield Dacron graft.  Marland Kitchen COPD (chronic obstructive pulmonary disease)   . Chronic diastolic CHF (congestive heart failure)     a.  Echo  (08/13/2013): EF 55-60%, normal wall motion, mild MR, mild LAE  . Pneumonia   . Arthritis     "little in my hands" (09/08/2013)  . Chronic kidney disease (CKD), stage III (moderate)   . PAF (paroxysmal atrial fibrillation)     a. amiodarone Rx started 08/2013;  b. s/p TEE-DCCV 09/2013 => recurrent AFib => repeat DCCV => NSR;   c. Xarelto 15 QD due to CKD  . Hx of cardiovascular stress test     a. Myoview 09/2012:  no scar or ischemia  . 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 Knee arthroscopy Right   . Abdominal aortic aneurysm repair  10/15/2012    Procedure: ANEURYSM ABDOMINAL AORTIC REPAIR;  Surgeon: Pryor Ochoa, MD;  Location: [facility] OR;  Service: Vascular;  Laterality: N/A;  Resection and Grafting of Abdominal Aortic Aneurysm - Aortobi-Iliac   . Tee without cardioversion N/A 09/22/2013    Procedure: TRANSESOPHAGEAL ECHOCARDIOGRAM (TEE);  Surgeon: Lars Masson, MD;  Location: [facility] ENDOSCOPY;  Service: Cardiovascular;  Laterality: N/A;  . Cardioversion N/A 09/22/2013    Procedure: CARDIOVERSION;  Surgeon: Lars Masson, MD;  Location: [facility] ENDOSCOPY;  Service: Cardiovascular;  Laterality: N/A;  . Abdominal aortic aneurysm repair  10-15-2012  . Cardioversion  Jan. 12, 2015    Dr. Doylene Canning. Ladona Ridgel  . Cardioversion N/A 09/28/2013    Procedure: CARDIOVERSION;  Surgeon: Marinus Maw, MD;  Location:  [facility] CATH LAB;  Service: Cardiovascular;  Laterality: N/A;  . Inguinal hernia repair Right 12/22/2014    Procedure: OPEN RIGHT INGUINAL HERNIA REPAIR WITH MESH;  Surgeon: Axel Filler, MD;  Location: [facility] ORS;  Service: General;  Laterality: Right;  . Insertion of mesh N/A 12/22/2014    Procedure: INSERTION OF MESH;  Surgeon: Axel Filler, MD;  Location: [facility] ORS;  Service: General;  Laterality: N/A;   Current Outpatient Prescriptions on File Prior to Visit  Medication Sig Dispense Refill  . albuterol (VENTOLIN HFA) 108 (90 BASE)  MCG/ACT inhaler Inhale 2 puffs into the lungs every 6 (six) hours as needed for wheezing or shortness of breath. 1 Inhaler 6  . aspirin 81 MG tablet Take 81 mg by mouth daily.    Marland Kitchen atorvastatin (LIPITOR) 10 MG tablet Take 1 tablet (10 mg total) by mouth daily. 90 tablet 3  . budesonide-formoterol (SYMBICORT) 160-4.5 MCG/ACT inhaler Inhale 2 puffs into the lungs 2 (two) times daily. 1 Inhaler 11  . furosemide (LASIX) 40 MG tablet TAKE 1 TABLET (40 MG TOTAL) BY MOUTH DAILY. 30 tablet 5  . levocetirizine (XYZAL) 5 MG tablet Take 1 tablet (5 mg total) by mouth every evening. 30 tablet 2  . ranitidine (ZANTAC) 150 MG tablet Take 150 mg by mouth daily as needed for heartburn.    . tamsulosin (FLOMAX) 0.4 MG CAPS capsule Take 1 capsule (0.4 mg total) by mouth daily. (Patient taking differently: Take 0.4 mg by mouth at bedtime. ) 30 capsule 3  . TAZTIA XT 360 MG 24 hr capsule TAKE 1 TABLET EVERY DAY 30 capsule 11  . TRADJENTA 5 MG TABS tablet TAKE 1 TABLET EVERY DAY 30 tablet 5   No current facility-administered medications on file prior to visit.   Allergies  Allergen Reactions  . Niaspan [Niacin Er] Anaphylaxis   History   Social History  . Marital Status: Married    Spouse Name: Nicole Cella  . Number of Children: 4  . Years of Education: N/A   Occupational History  . Works PT as a Education administrator    Social History Main Topics  . Smoking status: Former Smoker -- 1.00 packs/day for 56 years    Types: Cigarettes    Quit date: 07/31/2013  . Smokeless tobacco: Former Neurosurgeon    Types: Chew  . Alcohol Use: No  . Drug Use: No  . Sexual Activity: Not Currently   Other Topics Concern  . Not on file   Social History Narrative   Married.  Lives with wife.  Ambulates without assistance.      Review of Systems  All other systems reviewed and are negative.      Objective:   Physical Exam  Constitutional: He appears well-developed and well-nourished.  Cardiovascular: Normal rate, regular rhythm  and normal heart sounds.  Exam reveals no gallop and no friction rub.   No murmur heard. Pulmonary/Chest: Effort normal and breath sounds normal. No respiratory distress. He has no wheezes. He has no rales.  Abdominal: Soft. Bowel sounds are normal. He exhibits no distension. There is no tenderness. There is no rebound and no guarding.  Musculoskeletal: He exhibits no edema.  Vitals reviewed.         Assessment & Plan:  Diabetes mellitus type II, controlled - Plan: COMPLETE METABOLIC PANEL WITH GFR, Hemoglobin A1c, CBC with Differential/Platelet, Lipid panel  Paroxysmal atrial fibrillation  CKD (chronic kidney disease) stage 4, GFR 15-29 ml/min  COPD (chronic obstructive pulmonary disease) with chronic bronchitis  Chronic diastolic congestive heart failure  Patient's blood pressure is adequate. I will recheck his kidney function to ensure stability. I will also monitor his potassium. I will also check a hemoglobin A1c to monitor the control of his diabetes. His last hemoglobin A1c was 7.0. I will update the patient's immunizations today and give him Prevnar 13. Patient's breathing is doing extremely well since he discontinued amiodarone. Therefore I will discontinue the patient's oxygen. Currently the patient is normal sinus rhythm. He is appropriately anticoagulated with aspirin given his chronic kidney disease. His heart rate is controlled and he is in normal sinus rhythm. I will monitor a CBC to monitor for anemia associated with chronic kidney disease.

## 2015-04-18 NOTE — Addendum Note (Signed)
Addended by: Legrand Rams B on: 04/18/2015 09:39 AM   Modules accepted: Orders

## 2015-04-20 ENCOUNTER — Other Ambulatory Visit: Payer: Self-pay | Admitting: Family Medicine

## 2015-04-20 NOTE — Telephone Encounter (Signed)
Medication refilled per protocol. 

## 2015-06-23 ENCOUNTER — Other Ambulatory Visit: Payer: Self-pay | Admitting: Family Medicine

## 2015-06-23 MED ORDER — ALBUTEROL SULFATE HFA 108 (90 BASE) MCG/ACT IN AERS: INHALATION_SPRAY | RESPIRATORY_TRACT | Status: DC

## 2015-06-23 NOTE — Addendum Note (Signed)
Addended by: Legrand Rams B on: 06/23/2015 12:44 PM   Modules accepted: Orders

## 2015-06-23 NOTE — Telephone Encounter (Signed)
Refill appropriate and filled per protocol. 

## 2015-06-23 NOTE — Telephone Encounter (Signed)
Transmission to pharm failed - med resent

## 2015-06-24 ENCOUNTER — Other Ambulatory Visit: Payer: Self-pay | Admitting: Family Medicine

## 2015-07-05 ENCOUNTER — Telehealth: Payer: Self-pay | Admitting: Family Medicine

## 2015-07-05 MED ORDER — ALBUTEROL SULFATE HFA 108 (90 BASE) MCG/ACT IN AERS
2.0000 | INHALATION_SPRAY | Freq: Four times a day (QID) | RESPIRATORY_TRACT | Status: DC | PRN
Start: 2015-07-05 — End: 2015-11-29

## 2015-07-05 NOTE — Telephone Encounter (Signed)
No FYI

## 2015-07-05 NOTE — Telephone Encounter (Signed)
Do I need to do anything ?

## 2015-07-05 NOTE — Telephone Encounter (Signed)
[facility] and also spoke to pt.  [employer] denying Metoprolol Succ ER 25 mg.  Tried to get approved on CMM but said NO PA needed.  I called [employer].  They still have pt taking 3 - 25 mg a day.  Pt only on one a day.  There fore no PA needed.  Will call pharmacy and clarify as they dispensed 3/day RX.  Told pt to take once daily and that refill will last 3 mths.  Pt also says [employer] will not cover his ProAir HFA.  No problem, can switch to Albuterol inhaler that is covered.

## 2015-07-19 ENCOUNTER — Telehealth: Payer: Self-pay | Admitting: *Deleted

## 2015-07-19 NOTE — Telephone Encounter (Signed)
Submitted [employer] referral thru [employer] connect for authorization on 07/18/15 to Dr. Zetta BillsJay Patel, MD with authorization [id number]  Requesting provider: Jaquelyn BitterWarren Pickard,MD  Treating provider: Rosalie DoctorJay Patel,MD  Number of visits:6  Start Date: 07/18/15  End Date: 01/14/16  Dx: N18.4-Chronic Kidney Disease, stage 4 (severe)

## 2015-07-25 IMAGING — NM NM PULMONARY VENT & PERF
16 series · 16 of 16 positions shown · non-contrast
Comparison: Radiograph 12/24/2014

CLINICAL DATA: Short of breath for 48 hr. COPD and CHF. Concern for
pulmonary embolism. Surgery on 12/22/2014

EXAM:
NUCLEAR MEDICINE VENTILATION - PERFUSION LUNG SCAN
TECHNIQUE: Ventilation images were obtained in multiple projections using
inhaled aerosol technetium 99 M DTPA. Perfusion images were obtained
in multiple projections after intravenous injection of Ic-66m MAA.
RADIOPHARMACEUTICALS:  Forty mCi Ic-66m DTPA aerosol and 6 mCi
Ic-66m MAA

[Series 1: ant/post vent · 4.14mm/px · 1 of 1 slices shown (1 of 2)]
[im 1/1]
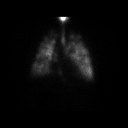

[Series 1: ant/post vent · 4.14mm/px · 1 of 1 slices shown (2 of 2)]
[im 1/1]
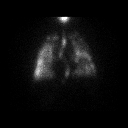

[Series 2: lao/rpo vent · 4.14mm/px · 1 of 1 slices shown (1 of 2)]
[im 1/1]
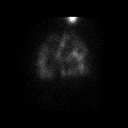

[Series 2: lao/rpo vent · 4.14mm/px · 1 of 1 slices shown (2 of 2)]
[im 1/1]
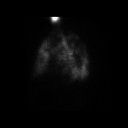

[Series 3: lpo/rao vent · 4.14mm/px · 1 of 1 slices shown (1 of 2)]
[im 1/1]
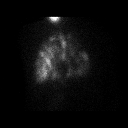

[Series 3: lpo/rao vent · 4.14mm/px · 1 of 1 slices shown (2 of 2)]
[im 1/1]
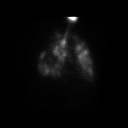

[Series 4: lt lat/rt lat vent · 4.14mm/px · 1 of 1 slices shown (1 of 2)]
[im 1/1]
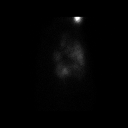

[Series 4: lt lat/rt lat vent · 4.14mm/px · 1 of 1 slices shown (2 of 2)]
[im 1/1]
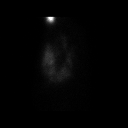

[Series 5: lt lat/rt lat perf · 4.14mm/px · 1 of 1 slices shown (1 of 2)]
[im 1/1]
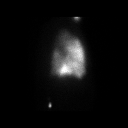

[Series 5: lt lat/rt lat perf · 4.14mm/px · 1 of 1 slices shown (2 of 2)]
[im 1/1]
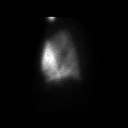

[Series 6: lpo/rao perf · 4.14mm/px · 1 of 1 slices shown (1 of 2)]
[im 1/1]
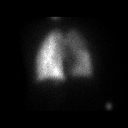

[Series 6: lpo/rao perf · 4.14mm/px · 1 of 1 slices shown (2 of 2)]
[im 1/1]
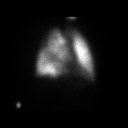

[Series 7: ant/post perf · 4.14mm/px · 1 of 1 slices shown (1 of 2)]
[im 1/1]
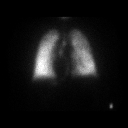

[Series 7: ant/post perf · 4.14mm/px · 1 of 1 slices shown (2 of 2)]
[im 1/1]
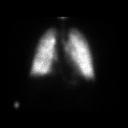

[Series 8: lao/rpo perf · 4.14mm/px · 1 of 1 slices shown (1 of 2)]
[im 1/1]
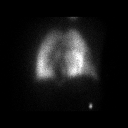

[Series 8: lao/rpo perf · 4.14mm/px · 1 of 1 slices shown (2 of 2)]
[im 1/1]
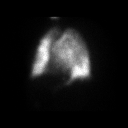

[16 of 16 positions shown; findings below may reference images not displayed]

FINDINGS: Ventilation: Extreme heterogeneity of ventilation within left and
right lung, upper and lower lobes consistent with chronic
obstructive pulmonary disease.

Perfusion: No wedge-shaped peripheral perfusion defects to suggest
acute pulmonary embolism.
IMPRESSION: 1. No evidence of acute pulmonary embolism.
2. Heart heterogeneity of ventilation consists with COPD.

## 2015-09-07 ENCOUNTER — Other Ambulatory Visit: Payer: Self-pay | Admitting: Family Medicine

## 2015-10-20 ENCOUNTER — Ambulatory Visit (INDEPENDENT_AMBULATORY_CARE_PROVIDER_SITE_OTHER): Payer: PPO | Admitting: Family Medicine

## 2015-10-20 ENCOUNTER — Encounter: Payer: Self-pay | Admitting: Family Medicine

## 2015-10-20 VITALS — BP 140/76 | HR 60 | Temp 97.7°F | Resp 18 | Wt 148.0 lb

## 2015-10-20 DIAGNOSIS — N184 Chronic kidney disease, stage 4 (severe): Secondary | ICD-10-CM

## 2015-10-20 DIAGNOSIS — E1122 Type 2 diabetes mellitus with diabetic chronic kidney disease: Secondary | ICD-10-CM | POA: Diagnosis not present

## 2015-10-20 DIAGNOSIS — J449 Chronic obstructive pulmonary disease, unspecified: Secondary | ICD-10-CM | POA: Diagnosis not present

## 2015-10-20 DIAGNOSIS — I480 Paroxysmal atrial fibrillation: Secondary | ICD-10-CM

## 2015-10-20 LAB — LIPID PANEL
CHOL/HDL RATIO: 3.9 ratio (ref <=5.0)
Cholesterol: 181 mg/dL (ref 125–200)
HDL: 46 mg/dL (ref >=40)
LDL Cholesterol: 116 mg/dL (ref <130)
Triglycerides: 95 mg/dL (ref <150)
VLDL: 19 mg/dL (ref <30)

## 2015-10-20 LAB — COMPLETE METABOLIC PANEL WITH GFR
ALT: 8 U/L — AB (ref 9–46)
AST: 11 U/L (ref 10–35)
Albumin: 4.1 g/dL (ref 3.6–5.1)
Alkaline Phosphatase: 88 U/L (ref 40–115)
BUN: 32 mg/dL — ABNORMAL HIGH (ref 7–25)
CHLORIDE: 104 mmol/L (ref 98–110)
CO2: 23 mmol/L (ref 20–31)
CREATININE: 2.29 mg/dL — AB (ref 0.70–1.18)
Calcium: 9.2 mg/dL (ref 8.6–10.3)
GFR, Est African American: 32 mL/min — ABNORMAL LOW (ref >=60)
GFR, Est Non African American: 28 mL/min — ABNORMAL LOW (ref >=60)
Glucose, Bld: 118 mg/dL — ABNORMAL HIGH (ref 70–99)
Potassium: 4.5 mmol/L (ref 3.5–5.3)
SODIUM: 140 mmol/L (ref 135–146)
Total Bilirubin: 0.5 mg/dL (ref 0.2–1.2)
Total Protein: 7 g/dL (ref 6.1–8.1)

## 2015-10-20 LAB — CBC WITH DIFFERENTIAL/PLATELET
BASOS ABS: 0 10*3/uL (ref 0.0–0.1)
Basophils Relative: 0 % (ref 0–1)
EOS ABS: 0.3 10*3/uL (ref 0.0–0.7)
EOS PCT: 3 % (ref 0–5)
HCT: 40.8 % (ref 39.0–52.0)
Hemoglobin: 13.2 g/dL (ref 13.0–17.0)
Lymphocytes Relative: 26 % (ref 12–46)
Lymphs Abs: 2.4 10*3/uL (ref 0.7–4.0)
MCH: 30.6 pg (ref 26.0–34.0)
MCHC: 32.4 g/dL (ref 30.0–36.0)
MCV: 94.4 fL (ref 78.0–100.0)
MPV: 9.7 fL (ref 8.6–12.4)
Monocytes Absolute: 0.7 10*3/uL (ref 0.1–1.0)
Monocytes Relative: 8 % (ref 3–12)
Neutro Abs: 5.9 10*3/uL (ref 1.7–7.7)
Neutrophils Relative %: 63 % (ref 43–77)
PLATELETS: 183 10*3/uL (ref 150–400)
RBC: 4.32 MIL/uL (ref 4.22–5.81)
RDW: 15.2 % (ref 11.5–15.5)
WBC: 9.3 10*3/uL (ref 4.0–10.5)

## 2015-10-20 LAB — HEMOGLOBIN A1C
Hgb A1c MFr Bld: 5.9 % — ABNORMAL HIGH (ref <5.7)
Mean Plasma Glucose: 123 mg/dL — ABNORMAL HIGH (ref <117)

## 2015-10-20 NOTE — Progress Notes (Signed)
Subjective:    Patient ID: Mark [name], male    DOB: 25-Jun-1946, 70 y.o.   MRN: [id number]  HPI 04/2015 Physical very pleasant 70 year old white male who has a history of COPD, congestive heart failure which is diastolic in nature, chronic kidney disease stage IV, diabetes mellitus, and hypertension. Ever since the patient discontinued his amiodarone, the patient's breathing has continued to improve. He has not had any recent "COPD exacerbations". I was frequently seen the patient for steroid-dependent COPD exacerbations in the past. This has improved dramatically. Patient states he is not even using his oxygen at home. Today he is 98% on room air. With ambulation for more than 200 feet, the patient's oxygen drops to 92% on room air. Patient no longer requires oxygen. His heart rate is in normal sinus rhythm. He is currently only taking an aspirin for stroke prevention. The patient has a history of paroxysmal atrial fibrillation however his one episode was in the hospital coupled with a respiratory infection. Since that time he has had no documented evidence of A. Fib. Because of his chronic kidney disease, the patient cannot tolerate other anticoagulates and therefore he was started on aspirin. His heart rate is well controlled today at 60 bpm. His blood pressure is borderline at 142/80. He is overdue for a check of his diabetes as well as a recheck of his kidney function and his potassium. He is also due to check his cholesterol.  At that time, my plan was: Patient's blood pressure is adequate. I will recheck his kidney function to ensure stability. I will also monitor his potassium. I will also check a hemoglobin A1c to monitor the control of his diabetes. His last hemoglobin A1c was 7.0. I will update the patient's immunizations today and give him Prevnar 13. Patient's breathing is doing extremely well since he discontinued amiodarone. Therefore I will discontinue the patient's oxygen. Currently the  patient is normal sinus rhythm. He is appropriately anticoagulated with aspirin given his chronic kidney disease. His heart rate is controlled and he is in normal sinus rhythm. I will monitor a CBC to monitor for anemia associated with chronic kidney disease.  10/20/15 The patient continues to do amazingly well. He is no on oxygen. He is not pain anymore and he is not breathing and saw dust and paint fumes. Since he has stopped this his breathing continues to improve. He is compliant taking Symbicort on a daily basis. He also uses his rescue inhaler on average once a day. With this combination, the patient is actually occasionally jogging up and down his driveway. He has not had any COPD exacerbations in the last 6 months. I am very impressed by this. Furthermore he shows no evidence of diastolic heart failure. He has no evidence of pulmonary edema. There are no peripheral edema. There is no evidence of fluid overload. He is overdue check his diabetes. His diabetic eye exam was performed last year and is up-to-date. He has had Pneumovax along with Prevnar. He received his flu shot at [facility] this year. He is overdue to check a hemoglobin A1c and a fasting lipid panel and a urine microalbumin. His only concern today is gas. He reports excessive flatus. He denies any constipation. He denies any diarrhea. He denies any black tarry stools. He denies any blood in his stool. He denies any nausea or vomiting or indigestion. He denies any heartburn. He denies any hematemesis. He denies any weight loss. There are no particular foods that seem to  exacerbate the problem Past Medical History  Diagnosis Date  . GERD (gastroesophageal reflux disease)   . Hyperlipidemia   . H/O hiatal hernia   . Hypertension     Sees Dr. Lynnea Ferrier  . AAA (abdominal aortic aneurysm)     a. 09/2013: Resection and grafting of abdominal aortic aneurysm with insertion of an aorto by common iliac graft using 18 x 9 mm Hemashield Dacron graft.    Marland Kitchen COPD (chronic obstructive pulmonary disease)   . Chronic diastolic CHF (congestive heart failure)     a.  Echo (08/13/2013): EF 55-60%, normal wall motion, mild MR, mild LAE  . Pneumonia   . Arthritis     "little in my hands" (09/08/2013)  . Chronic kidney disease (CKD), stage III (moderate)   . PAF (paroxysmal atrial fibrillation)     a. amiodarone Rx started 08/2013;  b. s/p TEE-DCCV 09/2013 => recurrent AFib => repeat DCCV => NSR;   c. Xarelto 15 QD due to CKD  . Hx of cardiovascular stress test     a. Myoview 09/2012:  no scar or ischemia  . 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 Knee arthroscopy Right   . Abdominal aortic aneurysm repair  10/15/2012    Procedure: ANEURYSM ABDOMINAL AORTIC REPAIR;  Surgeon: Pryor Ochoa, MD;  Location: [facility] OR;  Service: Vascular;  Laterality: N/A;  Resection and Grafting of Abdominal Aortic Aneurysm - Aortobi-Iliac   . Tee without cardioversion N/A 09/22/2013    Procedure: TRANSESOPHAGEAL ECHOCARDIOGRAM (TEE);  Surgeon: Lars Masson, MD;  Location: [facility] ENDOSCOPY;  Service: Cardiovascular;  Laterality: N/A;  . Cardioversion N/A 09/22/2013    Procedure: CARDIOVERSION;  Surgeon: Lars Masson, MD;  Location: [facility] ENDOSCOPY;  Service: Cardiovascular;  Laterality: N/A;  . Abdominal aortic aneurysm repair  10-15-2012  . Cardioversion  Jan. 12, 2015    Dr. Doylene Canning. Ladona Ridgel  . Cardioversion N/A 09/28/2013    Procedure: CARDIOVERSION;  Surgeon: Marinus Maw, MD;  Location: [facility] CATH LAB;  Service: Cardiovascular;  Laterality: N/A;  . Inguinal hernia repair Right 12/22/2014    Procedure: OPEN RIGHT INGUINAL HERNIA REPAIR WITH MESH;  Surgeon: Axel Filler, MD;  Location: [facility] ORS;  Service: General;  Laterality: Right;  . Insertion of mesh N/A 12/22/2014    Procedure: INSERTION OF MESH;  Surgeon: Axel Filler, MD;  Location: [facility] ORS;  Service: General;  Laterality: N/A;   Current Outpatient  Prescriptions on File Prior to Visit  Medication Sig Dispense Refill  . albuterol (PROVENTIL HFA;VENTOLIN HFA) 108 (90 BASE) MCG/ACT inhaler Inhale 2 puffs into the lungs every 6 (six) hours as needed for wheezing or shortness of breath. 18 g 3  . aspirin 81 MG tablet Take 81 mg by mouth daily.    Marland Kitchen atorvastatin (LIPITOR) 10 MG tablet Take 1 tablet (10 mg total) by mouth daily. 90 tablet 3  . budesonide-formoterol (SYMBICORT) 160-4.5 MCG/ACT inhaler Inhale 2 puffs into the lungs 2 (two) times daily. 1 Inhaler 11  . furosemide (LASIX) 40 MG tablet TAKE 1 TABLET (40 MG TOTAL) BY MOUTH DAILY. 30 tablet 5  . levocetirizine (XYZAL) 5 MG tablet Take 1 tablet (5 mg total) by mouth every evening. 30 tablet 2  . metoprolol succinate (TOPROL-XL) 25 MG 24 hr tablet TAKE 1 TABLETS BY MOUTH EVERY DAY TAKE WITH OR IMMEDIATELY AFTER BREAKFAST  11  . ranitidine (ZANTAC) 150 MG tablet Take  150 mg by mouth daily as needed for heartburn.    . tamsulosin (FLOMAX) 0.4 MG CAPS capsule TAKE 1 CAPSULE (0.4 MG TOTAL) BY MOUTH DAILY. 90 capsule 3  . TAZTIA XT 360 MG 24 hr capsule TAKE 1 TABLET EVERY DAY 30 capsule 11  . TRADJENTA 5 MG TABS tablet TAKE 1 TABLET EVERY DAY 30 tablet 5   No current facility-administered medications on file prior to visit.   Allergies  Allergen Reactions  . Niaspan [Niacin Er] Anaphylaxis   Social History   Social History  . Marital Status: Married    Spouse Name: Nicole Cella  . Number of Children: 4  . Years of Education: N/A   Occupational History  . Works PT as a Education administrator    Social History Main Topics  . Smoking status: Former Smoker -- 1.00 packs/day for 56 years    Types: Cigarettes    Quit date: 07/31/2013  . Smokeless tobacco: Former Neurosurgeon    Types: Chew  . Alcohol Use: No  . Drug Use: No  . Sexual Activity: Not Currently   Other Topics Concern  . Not on file   Social History Narrative   Married.  Lives with wife.  Ambulates without assistance.      Review of  Systems  All other systems reviewed and are negative.      Objective:   Physical Exam  Constitutional: He appears well-developed and well-nourished.  Cardiovascular: Normal rate, regular rhythm and normal heart sounds.  Exam reveals no gallop and no friction rub.   No murmur heard. Pulmonary/Chest: Effort normal and breath sounds normal. No respiratory distress. He has no wheezes. He has no rales.  Abdominal: Soft. Bowel sounds are normal. He exhibits no distension. There is no tenderness. There is no rebound and no guarding.  Musculoskeletal: He exhibits no edema.  Vitals reviewed.         Assessment & Plan:  Controlled type 2 diabetes mellitus with stage 4 chronic kidney disease, without long-term current use of insulin (HCC) - Plan: CBC with Differential/Platelet, COMPLETE METABOLIC PANEL WITH GFR, Hemoglobin A1c, Lipid panel, Microalbumin, urine  Paroxysmal atrial fibrillation (HCC)  CKD (chronic kidney disease) stage 4, GFR 15-29 ml/min (HCC)  COPD (chronic obstructive pulmonary disease) with chronic bronchitis (HCC) I'm very impressed by how he was doing regarding his COPD. Continue Symbicort. There is no indication for oxygen. He can continue to use his rescue albuterol up to 4 times a day as needed. Immunizations are up-to-date. I will monitor his kidney function and also check a urine microalbumin. Continue aspirin for paroxysmal atrial fibrillation although today he is in normal sinus rhythm and his heart rate is well controlled. I will check a fasting lipid panel with a goal LDL cholesterol less than 629. I will also check an A1c with a goal hemoglobin A1c less than 6.5. I have asked the patient to try a probiotic, Restora, once a day for gas. If symptoms are not improving after 2 weeks, I will proceed with further workup.

## 2015-10-21 LAB — MICROALBUMIN, URINE: Microalb, Ur: 6.8 mg/dL

## 2015-10-31 ENCOUNTER — Telehealth: Payer: Self-pay | Admitting: Family Medicine

## 2015-10-31 NOTE — Telephone Encounter (Signed)
Patient is calling to say that the samples of the restora is not working please call him regarding this at [phone]

## 2015-11-01 NOTE — Telephone Encounter (Signed)
Called and spoke to pt and he states that the Restora has not helped at all - he still has gas and bloating no change in that at all. Also he wanted to know if he should be still taking Furosemide.

## 2015-11-01 NOTE — Telephone Encounter (Signed)
Try stopping ranitidine and replacing it and the restora with protonix 40 mg poqday.  If no better after 2 weeks, I would work up further.

## 2015-11-02 MED ORDER — PANTOPRAZOLE SODIUM 40 MG PO TBEC: 40.0000 mg | DELAYED_RELEASE_TABLET | Freq: Every day | ORAL | Status: DC

## 2015-11-02 NOTE — Telephone Encounter (Signed)
Patient aware and med sent to pharm 

## 2015-11-29 ENCOUNTER — Other Ambulatory Visit: Payer: Self-pay | Admitting: Family Medicine

## 2015-11-29 NOTE — Telephone Encounter (Signed)
Medication refilled per protocol. 

## 2015-12-23 ENCOUNTER — Encounter: Payer: Self-pay | Admitting: Family

## 2015-12-26 ENCOUNTER — Other Ambulatory Visit: Payer: Self-pay | Admitting: Family Medicine

## 2015-12-26 NOTE — Telephone Encounter (Signed)
Taztia 180 BID denied, pt on different dose.

## 2015-12-29 ENCOUNTER — Telehealth: Payer: Self-pay | Admitting: Family Medicine

## 2015-12-29 MED ORDER — DILTIAZEM HCL ER BEADS 180 MG PO CP24: 360.0000 mg | ORAL_CAPSULE | Freq: Every day | ORAL | Status: DC

## 2015-12-29 NOTE — Telephone Encounter (Signed)
Called pharmacy, the 360 mg capsules are on back order.  Given verbal refill for the 180 mg 2/daily

## 2015-12-29 NOTE — Telephone Encounter (Signed)
Patient called checking on his refill request for TAZTIA XT 360 MG 24 hr capsule he states that the pharmacy has been given him 180 mg and he has to take 2 tablets a day. He would like to know why he can't get this medication.   CB# [phone]

## 2016-01-03 ENCOUNTER — Ambulatory Visit (HOSPITAL_COMMUNITY)
Admission: RE | Admit: 2016-01-03 | Discharge: 2016-01-03 | Disposition: A | Discharge status: Home / Self Care | Payer: PPO | Source: Ambulatory Visit | Attending: Family | Admitting: Family

## 2016-01-03 ENCOUNTER — Ambulatory Visit (INDEPENDENT_AMBULATORY_CARE_PROVIDER_SITE_OTHER): Payer: PPO | Admitting: Family

## 2016-01-03 ENCOUNTER — Encounter: Payer: Self-pay | Admitting: Family

## 2016-01-03 ENCOUNTER — Telehealth: Payer: Self-pay | Admitting: Family Medicine

## 2016-01-03 VITALS — BP 122/75 | HR 90 | Ht 66.5 in | Wt 144.5 lb

## 2016-01-03 DIAGNOSIS — K219 Gastro-esophageal reflux disease without esophagitis: Secondary | ICD-10-CM | POA: Insufficient documentation

## 2016-01-03 DIAGNOSIS — E785 Hyperlipidemia, unspecified: Secondary | ICD-10-CM | POA: Diagnosis not present

## 2016-01-03 DIAGNOSIS — E1122 Type 2 diabetes mellitus with diabetic chronic kidney disease: Secondary | ICD-10-CM | POA: Insufficient documentation

## 2016-01-03 DIAGNOSIS — Z48812 Encounter for surgical aftercare following surgery on the circulatory system: Secondary | ICD-10-CM | POA: Insufficient documentation

## 2016-01-03 DIAGNOSIS — Z95828 Presence of other vascular implants and grafts: Secondary | ICD-10-CM

## 2016-01-03 DIAGNOSIS — I714 Abdominal aortic aneurysm, without rupture, unspecified: Secondary | ICD-10-CM

## 2016-01-03 DIAGNOSIS — N183 Chronic kidney disease, stage 3 (moderate): Secondary | ICD-10-CM | POA: Diagnosis not present

## 2016-01-03 DIAGNOSIS — Z8679 Personal history of other diseases of the circulatory system: Secondary | ICD-10-CM | POA: Insufficient documentation

## 2016-01-03 DIAGNOSIS — Z9889 Other specified postprocedural states: Secondary | ICD-10-CM | POA: Insufficient documentation

## 2016-01-03 DIAGNOSIS — I130 Hypertensive heart and chronic kidney disease with heart failure and stage 1 through stage 4 chronic kidney disease, or unspecified chronic kidney disease: Secondary | ICD-10-CM | POA: Diagnosis not present

## 2016-01-03 DIAGNOSIS — I5032 Chronic diastolic (congestive) heart failure: Secondary | ICD-10-CM | POA: Diagnosis not present

## 2016-01-03 DIAGNOSIS — I723 Aneurysm of iliac artery: Secondary | ICD-10-CM | POA: Diagnosis not present

## 2016-01-03 DIAGNOSIS — R0989 Other specified symptoms and signs involving the circulatory and respiratory systems: Secondary | ICD-10-CM | POA: Diagnosis present

## 2016-01-03 NOTE — Telephone Encounter (Signed)
Patient is calling to talk to you regarding his heart  [phone]

## 2016-01-03 NOTE — Progress Notes (Signed)
[facility] [place] HISTORY AND PHYSICAL  CC: Follow up s/p grafting of abdominal aortobi-iliac aneurysm   History of Present Illness Mark [name] is a 70 y.o. male patient of Dr. Hart RochesterLawson who is s/p resection and Grafting of Abdominal - Aortobi-Iliac Aneurysm on 10/15/2012, who presents for routine follow up. ABI (Date: 10/13/2012) demonstrates: 1.09 on the right and 0.91 on the left.  The patient has not had back or abdominal pain. He denies claudication symptoms with walking.  His left hip no longer hurts since resection and Grafting of Abdominal Aortobi-Iliac Aneurysm. He was hospitalized at [facility] for pneumonia January, 2015, also has COPD, lost 25 pounds. He feels well now.  He was taking Xaralto for atrial fib, is no longer taking, stopped by Dr. Tanya NonesPickard per pt.  Pt denies dyspnea, denies feeling light headed, denies chest pain. Pt states he stays active, is sometimes limited by dyspnea.  Pt Diabetic: Yes, well controlled Pt smoker: former smoker, quit November, 2014    Pt meds include: Statin :Yes Betablocker: No ASA: Yes Other anticoagulants/antiplatelets: no  Past Medical History  Diagnosis Date  . GERD (gastroesophageal reflux disease)   . Hyperlipidemia   . H/O hiatal hernia   . Hypertension     Sees Dr. Lynnea FerrierWarren Pickard  . AAA (abdominal aortic aneurysm) (HCC)     a. 09/2013: Resection and grafting of abdominal aortic aneurysm with insertion of an aorto by common iliac graft using 18 x 9 mm Hemashield Dacron graft.  Marland Kitchen. COPD (chronic obstructive pulmonary disease) (HCC)   . Chronic diastolic CHF (congestive heart failure) (HCC)     a.  Echo (08/13/2013): EF 55-60%, normal wall motion, mild MR, mild LAE  . Pneumonia   . Arthritis     "little in my hands" (09/08/2013)  . Chronic kidney disease (CKD), stage III (moderate)   . PAF (paroxysmal atrial fibrillation) (HCC)     a. amiodarone Rx started 08/2013;  b. s/p TEE-DCCV 09/2013 => recurrent  AFib => repeat DCCV => NSR;   c. Xarelto 15 QD due to CKD  . Hx of cardiovascular stress test     a. Myoview 09/2012:  no scar or ischemia  . Diabetes mellitus without complication (HCC)     TYPE 2    Social History Social History  Substance Use Topics  . Smoking status: Former Smoker -- 1.00 packs/day for 56 years    Types: Cigarettes    Quit date: 07/31/2013  . Smokeless tobacco: Former NeurosurgeonUser    Types: Chew  . Alcohol Use: No    Family History Family History  Problem Relation Age of Onset  . Diabetes Mother   . Tuberculosis Mother   . Heart disease Father   . Heart attack Father   . Heart disease Son     Blood clot:  Arm    Past Surgical History  Procedure Laterality Date  . Knee arthroscopy Right   . Abdominal aortic aneurysm repair  10/15/2012    Procedure: ANEURYSM ABDOMINAL AORTIC REPAIR;  Surgeon: Pryor OchoaJames D Lawson, MD;  [facility];  Service: Vascular;  Laterality: N/A;  Resection and Grafting of Abdominal Aortic Aneurysm - Aortobi-Iliac   . Tee without cardioversion N/A 09/22/2013    Procedure: TRANSESOPHAGEAL ECHOCARDIOGRAM (TEE);  Surgeon: Lars MassonKatarina H Nelson, MD;  [facility];  Service: Cardiovascular;  Laterality: N/A;  . Cardioversion N/A 09/22/2013    Procedure: CARDIOVERSION;  Surgeon: Lars MassonKatarina H Nelson, MD;  [facility];  Service: Cardiovascular;  Laterality: N/A;  . Abdominal aortic aneurysm repair  10-15-2012  . Cardioversion  Jan. 12, 2015    Dr. Doylene Canning. Ladona Ridgel  . Cardioversion N/A 09/28/2013    Procedure: CARDIOVERSION;  Surgeon: Marinus Maw, MD;  Location: [facility] CATH LAB;  Service: Cardiovascular;  Laterality: N/A;  . Inguinal hernia repair Right 12/22/2014    Procedure: OPEN RIGHT INGUINAL HERNIA REPAIR WITH MESH;  Surgeon: Axel Filler, MD;  Location: [facility] ORS;  Service: General;  Laterality: Right;  . Insertion of mesh N/A 12/22/2014    Procedure: INSERTION OF MESH;  Surgeon: Axel Filler, MD;  Location: [facility] ORS;  Service: General;   Laterality: N/A;    Allergies  Allergen Reactions  . Niaspan [Niacin Er] Anaphylaxis    Current Outpatient Prescriptions  Medication Sig Dispense Refill  . aspirin 81 MG tablet Take 81 mg by mouth daily.    Marland Kitchen atorvastatin (LIPITOR) 10 MG tablet TAKE 1 TABLET EVERY DAY 90 tablet 3  . budesonide-formoterol (SYMBICORT) 160-4.5 MCG/ACT inhaler Inhale 2 puffs into the lungs 2 (two) times daily. 1 Inhaler 11  . diltiazem (TAZTIA XT) 180 MG 24 hr capsule Take 2 capsules (360 mg total) by mouth daily. 60 capsule 5  . furosemide (LASIX) 40 MG tablet TAKE 1 TABLET (40 MG TOTAL) BY MOUTH DAILY. 30 tablet 5  . levocetirizine (XYZAL) 5 MG tablet Take 1 tablet (5 mg total) by mouth every evening. (Patient not taking: Reported on 01/03/2016) 30 tablet 2  . metoprolol succinate (TOPROL-XL) 25 MG 24 hr tablet TAKE 1 TABLETS BY MOUTH EVERY DAY TAKE WITH OR IMMEDIATELY AFTER BREAKFAST  11  . pantoprazole (PROTONIX) 40 MG tablet Take 1 tablet (40 mg total) by mouth daily. 30 tablet 3  . ranitidine (ZANTAC) 150 MG tablet Take 150 mg by mouth daily as needed for heartburn. Reported on 01/03/2016    . tamsulosin (FLOMAX) 0.4 MG CAPS capsule TAKE 1 CAPSULE (0.4 MG TOTAL) BY MOUTH DAILY. 90 capsule 3  . TAZTIA XT 360 MG 24 hr capsule TAKE 1 TABLET EVERY DAY 30 capsule 11  . TRADJENTA 5 MG TABS tablet TAKE 1 TABLET EVERY DAY (Patient not taking: Reported on 01/03/2016) 30 tablet 5  . VENTOLIN HFA 108 (90 Base) MCG/ACT inhaler INHALE 2 PUFFS INTO THE LUNGS EVERY 6 (SIX) HOURS AS NEEDED FOR WHEEZING OR SHORTNESS OF BREATH. 18 g 5   No current facility-administered medications for this visit.    ROS: See HPI for pertinent positives and negatives.   Physical Examination  Filed Vitals:   01/03/16 1423  BP: 122/75  Pulse: 90  Height: 5' 6.5" (1.689 m)  Weight: 144 lb 8 oz (65.545 kg)  SpO2: 96%   Body mass index is 22.98 kg/(m^2).  General: A&O x 3, WD.  Pulmonary: Sym exp, good air movt, CTAB, no rales,  rhonchi, or wheezing.   Cardiac: Irregular rhythm, rate is 90/min, no detected murmur.   Carotid Bruits Left Right   Negative Negative   Aorta is faintly palpable. Radial pulses are 2+ palpable and equal.   VASCULAR EXAM: Extremities without ischemic changes  without Gangrene; without open wounds. No edema in ankles.    LE Pulses LEFT RIGHT   FEMORAL  palpable  palpable    POPLITEAL not palpable  not palpable   POSTERIOR TIBIAL  palpable   palpable    DORSALIS PEDIS  ANTERIOR TIBIAL palpable  palpable      Gastrointestinal: soft, NTND, -G/R, - HSM, - palpable masses, -  CVAT B.  Musculoskeletal: M/S 5/5 throughout , Extremities without ischemic changes.  Neurologic: Pain and light touch intact in extremities, Motor exam as listed above. Some hearing loss.          Non-Invasive Vascular Imaging: DATE: 01/03/2016 ABI: RIGHT: 1.05 (1.15 (12/29/13), Waveforms: triphasic;  LEFT: 1.13 (1.18), Waveforms: triphasic   ASSESSMENT: ADRON GEISEL is a 70 y.o. male who is s/p resection and Grafting of Abdominal - Aortobi-Iliac Aneurysm on 10/15/2012. He has no back pain or abdominal pain.  ABI's remain normal bilaterally with all triphasic waveforms.   Irregular cardiac rhythm, pt is asymptomatic; will notify Dr. Tanya Nones, advised pt to call Dr. Caren Macadam office and to follow his advice re this.   PLAN:  Based on the patient's vascular studies and examination, pt will return to clinic in 2 years with ABI's.     Charisse March, RN, MSN, FNP-C [facility] [place] Phone: [phone]  Clinic MD: Early  01/03/2016 2:44 PM

## 2016-01-04 ENCOUNTER — Other Ambulatory Visit: Payer: Self-pay | Admitting: Family

## 2016-01-04 DIAGNOSIS — Z9889 Other specified postprocedural states: Secondary | ICD-10-CM

## 2016-01-04 DIAGNOSIS — I714 Abdominal aortic aneurysm, without rupture, unspecified: Secondary | ICD-10-CM

## 2016-01-04 DIAGNOSIS — Z48812 Encounter for surgical aftercare following surgery on the circulatory system: Secondary | ICD-10-CM

## 2016-01-04 DIAGNOSIS — Z8679 Personal history of other diseases of the circulatory system: Secondary | ICD-10-CM

## 2016-01-04 NOTE — Telephone Encounter (Signed)
Called and spoke to pt and pt was asking about his heart skipping as the NP at [facility] was telling him he needed to f/u with that. Appointment made with Dr. Tanya NonesPickard to discuss as he had problems taking certain medications for A-fib.

## 2016-01-10 ENCOUNTER — Ambulatory Visit (INDEPENDENT_AMBULATORY_CARE_PROVIDER_SITE_OTHER): Payer: PPO | Admitting: Family Medicine

## 2016-01-10 ENCOUNTER — Encounter: Payer: Self-pay | Admitting: Family Medicine

## 2016-01-10 VITALS — BP 126/68 | HR 84 | Temp 97.9°F | Resp 16 | Ht 66.5 in | Wt 145.0 lb

## 2016-01-10 DIAGNOSIS — N184 Chronic kidney disease, stage 4 (severe): Secondary | ICD-10-CM | POA: Diagnosis not present

## 2016-01-10 DIAGNOSIS — E1122 Type 2 diabetes mellitus with diabetic chronic kidney disease: Secondary | ICD-10-CM | POA: Diagnosis not present

## 2016-01-10 DIAGNOSIS — I499 Cardiac arrhythmia, unspecified: Secondary | ICD-10-CM | POA: Diagnosis not present

## 2016-01-10 DIAGNOSIS — I480 Paroxysmal atrial fibrillation: Secondary | ICD-10-CM | POA: Diagnosis not present

## 2016-01-10 DIAGNOSIS — J449 Chronic obstructive pulmonary disease, unspecified: Secondary | ICD-10-CM | POA: Diagnosis not present

## 2016-01-10 MED ORDER — RIVAROXABAN 15 MG PO TABS: 15.0000 mg | ORAL_TABLET | Freq: Every day | ORAL | Status: DC

## 2016-01-10 NOTE — Progress Notes (Signed)
Subjective:    Patient ID: Mark [name], male    DOB: 25-Jun-1946, 70 y.o.   MRN: [id number]  HPI 04/2015 Physical very pleasant 70 year old white male who has a history of COPD, congestive heart failure which is diastolic in nature, chronic kidney disease stage IV, diabetes mellitus, and hypertension. Ever since the patient discontinued his amiodarone, the patient's breathing has continued to improve. He has not had any recent "COPD exacerbations". I was frequently seen the patient for steroid-dependent COPD exacerbations in the past. This has improved dramatically. Patient states he is not even using his oxygen at home. Today he is 98% on room air. With ambulation for more than 200 feet, the patient's oxygen drops to 92% on room air. Patient no longer requires oxygen. His heart rate is in normal sinus rhythm. He is currently only taking an aspirin for stroke prevention. The patient has a history of paroxysmal atrial fibrillation however his one episode was in the hospital coupled with a respiratory infection. Since that time he has had no documented evidence of A. Fib. Because of his chronic kidney disease, the patient cannot tolerate other anticoagulates and therefore he was started on aspirin. His heart rate is well controlled today at 60 bpm. His blood pressure is borderline at 142/80. He is overdue for a check of his diabetes as well as a recheck of his kidney function and his potassium. He is also due to check his cholesterol.  At that time, my plan was: Patient's blood pressure is adequate. I will recheck his kidney function to ensure stability. I will also monitor his potassium. I will also check a hemoglobin A1c to monitor the control of his diabetes. His last hemoglobin A1c was 7.0. I will update the patient's immunizations today and give him Prevnar 13. Patient's breathing is doing extremely well since he discontinued amiodarone. Therefore I will discontinue the patient's oxygen. Currently the  patient is normal sinus rhythm. He is appropriately anticoagulated with aspirin given his chronic kidney disease. His heart rate is controlled and he is in normal sinus rhythm. I will monitor a CBC to monitor for anemia associated with chronic kidney disease.  10/20/15 The patient continues to do amazingly well. He is no on oxygen. He is not pain anymore and he is not breathing and saw dust and paint fumes. Since he has stopped this his breathing continues to improve. He is compliant taking Symbicort on a daily basis. He also uses his rescue inhaler on average once a day. With this combination, the patient is actually occasionally jogging up and down his driveway. He has not had any COPD exacerbations in the last 6 months. I am very impressed by this. Furthermore he shows no evidence of diastolic heart failure. He has no evidence of pulmonary edema. There are no peripheral edema. There is no evidence of fluid overload. He is overdue check his diabetes. His diabetic eye exam was performed last year and is up-to-date. He has had Pneumovax along with Prevnar. He received his flu shot at [facility] this year. He is overdue to check a hemoglobin A1c and a fasting lipid panel and a urine microalbumin. His only concern today is gas. He reports excessive flatus. He denies any constipation. He denies any diarrhea. He denies any black tarry stools. He denies any blood in his stool. He denies any nausea or vomiting or indigestion. He denies any heartburn. He denies any hematemesis. He denies any weight loss. There are no particular foods that seem to  exacerbate the problem.  At that time, my plan was: I'm very impressed by how he was doing regarding his COPD. Continue Symbicort. There is no indication for oxygen. He can continue to use his rescue albuterol up to 4 times a day as needed. Immunizations are up-to-date. I will monitor his kidney function and also check a urine microalbumin. Continue aspirin for paroxysmal atrial  fibrillation although today he is in normal sinus rhythm and his heart rate is well controlled. I will check a fasting lipid panel with a goal LDL cholesterol less than 301. I will also check an A1c with a goal hemoglobin A1c less than 6.5. I have asked the patient to try a probiotic, Restora, once a day for gas. If symptoms are not improving after 2 weeks, I will proceed with further workup.  01/10/16 We received a call from his home health nurse who reported that the patient had an irregular heartbeat. He does have a history of paroxysmal atrial fibrillation. In the past he was one documented episode around the time of a pulmonary complication. The patient remained in normal sinus rhythm for months afterwards and therefore was felt to be temporary and his amiodarone and anticoagulation were discontinued. Currently the patient is on aspirin alone.  Unfortunately today on his EKG he appears to be in atrial fibrillation with no definitive P wave morphology that is consistent. His pulse is irregularly irregular.  CHADS2 score  Is 3 given his diabetes, hypertension, and history of congestive heart failure. Past Medical History  Diagnosis Date  . GERD (gastroesophageal reflux disease)   . Hyperlipidemia   . H/O hiatal hernia   . Hypertension     Sees Dr. Lynnea Ferrier  . AAA (abdominal aortic aneurysm) (HCC)     a. 09/2013: Resection and grafting of abdominal aortic aneurysm with insertion of an aorto by common iliac graft using 18 x 9 mm Hemashield Dacron graft.  Marland Kitchen COPD (chronic obstructive pulmonary disease) (HCC)   . Chronic diastolic CHF (congestive heart failure) (HCC)     a.  Echo (08/13/2013): EF 55-60%, normal wall motion, mild MR, mild LAE  . Pneumonia   . Arthritis     "little in my hands" (09/08/2013)  . Chronic kidney disease (CKD), stage III (moderate)   . PAF (paroxysmal atrial fibrillation) (HCC)     a. amiodarone Rx started 08/2013;  b. s/p TEE-DCCV 09/2013 => recurrent AFib => repeat  DCCV => NSR;   c. Xarelto 15 QD due to CKD  . Hx of cardiovascular stress test     a. Myoview 09/2012:  no scar or ischemia  . Diabetes mellitus without complication (HCC)     TYPE 2   Past Surgical History  Procedure Laterality Date  . Knee arthroscopy Right   . Abdominal aortic aneurysm repair  10/15/2012    Procedure: ANEURYSM ABDOMINAL AORTIC REPAIR;  Surgeon: Pryor Ochoa, MD;  Location: [facility] OR;  Service: Vascular;  Laterality: N/A;  Resection and Grafting of Abdominal Aortic Aneurysm - Aortobi-Iliac   . Tee without cardioversion N/A 09/22/2013    Procedure: TRANSESOPHAGEAL ECHOCARDIOGRAM (TEE);  Surgeon: Lars Masson, MD;  Location: [facility] ENDOSCOPY;  Service: Cardiovascular;  Laterality: N/A;  . Cardioversion N/A 09/22/2013    Procedure: CARDIOVERSION;  Surgeon: Lars Masson, MD;  Location: [facility] ENDOSCOPY;  Service: Cardiovascular;  Laterality: N/A;  . Abdominal aortic aneurysm repair  10-15-2012  . Cardioversion  Jan. 12, 2015    Dr. Doylene Canning. Ladona Ridgel  . Cardioversion  N/A 09/28/2013    Procedure: CARDIOVERSION;  Surgeon: Marinus MawGregg W Taylor, MD;  [facility];  Service: Cardiovascular;  Laterality: N/A;  . Inguinal hernia repair Right 12/22/2014    Procedure: OPEN RIGHT INGUINAL HERNIA REPAIR WITH MESH;  Surgeon: Axel FillerArmando Ramirez, MD;  Location: [facility] ORS;  Service: General;  Laterality: Right;  . Insertion of mesh N/A 12/22/2014    Procedure: INSERTION OF MESH;  Surgeon: Axel FillerArmando Ramirez, MD;  Location: [facility] ORS;  Service: General;  Laterality: N/A;   Current Outpatient Prescriptions on File Prior to Visit  Medication Sig Dispense Refill  . aspirin 81 MG tablet Take 81 mg by mouth daily.    Marland Kitchen. atorvastatin (LIPITOR) 10 MG tablet TAKE 1 TABLET EVERY DAY 90 tablet 3  . budesonide-formoterol (SYMBICORT) 160-4.5 MCG/ACT inhaler Inhale 2 puffs into the lungs 2 (two) times daily. 1 Inhaler 11  . diltiazem (TAZTIA XT) 180 MG 24 hr capsule Take 2 capsules (360 mg total) by mouth daily. 60 capsule 5   . furosemide (LASIX) 40 MG tablet TAKE 1 TABLET (40 MG TOTAL) BY MOUTH DAILY. 30 tablet 5  . metoprolol succinate (TOPROL-XL) 25 MG 24 hr tablet TAKE 1 TABLETS BY MOUTH EVERY DAY TAKE WITH OR IMMEDIATELY AFTER BREAKFAST  11  . pantoprazole (PROTONIX) 40 MG tablet Take 1 tablet (40 mg total) by mouth daily. 30 tablet 3  . tamsulosin (FLOMAX) 0.4 MG CAPS capsule TAKE 1 CAPSULE (0.4 MG TOTAL) BY MOUTH DAILY. 90 capsule 3  . VENTOLIN HFA 108 (90 Base) MCG/ACT inhaler INHALE 2 PUFFS INTO THE LUNGS EVERY 6 (SIX) HOURS AS NEEDED FOR WHEEZING OR SHORTNESS OF BREATH. 18 g 5  . TAZTIA XT 360 MG 24 hr capsule TAKE 1 TABLET EVERY DAY (Patient not taking: Reported on 01/10/2016) 30 capsule 11   No current facility-administered medications on file prior to visit.   Allergies  Allergen Reactions  . Niaspan [Niacin Er] Anaphylaxis   Social History   Social History  . Marital Status: Married    Spouse Name: [name]  . Number of Children: 4  . Years of Education: N/A   Occupational History  . Works PT as a Education administratorpainter    Social History Main Topics  . Smoking status: Former Smoker -- 1.00 packs/day for 56 years    Types: Cigarettes    Quit date: 07/31/2013  . Smokeless tobacco: Former NeurosurgeonUser    Types: Chew  . Alcohol Use: No  . Drug Use: No  . Sexual Activity: Not Currently   Other Topics Concern  . Not on file   Social History Narrative   Married.  Lives with wife.  Ambulates without assistance.      Review of Systems  All other systems reviewed and are negative.      Objective:   Physical Exam  Constitutional: He appears well-developed and well-nourished.  Cardiovascular: Normal rate and normal heart sounds.  An irregularly irregular rhythm present. Exam reveals no gallop and no friction rub.   No murmur heard. Pulmonary/Chest: Effort normal and breath sounds normal. No respiratory distress. He has no wheezes. He has no rales.  Abdominal: Soft. Bowel sounds are normal. He exhibits no  distension. There is no tenderness. There is no rebound and no guarding.  Musculoskeletal: He exhibits no edema.  Vitals reviewed.         Assessment & Plan:  Irregular heart beat - Plan: EKG 12-Lead  Controlled type 2 diabetes mellitus with stage 4 chronic kidney disease, without long-term  current use of insulin (HCC)  Paroxysmal atrial fibrillation (HCC)  CKD (chronic kidney disease) stage 4, GFR 15-29 ml/min (HCC)  COPD (chronic obstructive pulmonary disease) with chronic bronchitis (HCC)  EKG does show atrial fibrillation. This is confirmed on examination. Aspirin 81 mg daily is not sufficient given his chads score. I recommended switching back to Xarelto. However given his chronic kidney disease with a glomerular filtration rate of approximately 30 mL/m, the patient needs to be on 15 mg a Xarelto once a day rather than 20. I sent this prescription to his pharmacy. Discontinue aspirin. Recheck in 3 months

## 2016-01-13 ENCOUNTER — Telehealth: Payer: Self-pay | Admitting: Family Medicine

## 2016-01-13 NOTE — Telephone Encounter (Signed)
Patient aware of providers recommendations.  

## 2016-01-13 NOTE — Telephone Encounter (Signed)
Pt was under the impression that Dr Tanya NonesPickard wanted him to be referred to a Cardiologist. Please advise.  [phone]

## 2016-01-13 NOTE — Telephone Encounter (Signed)
I only wanted him to resume xarelto and stop aspirin because he is back in a fib.  Unless he is having other issues, I do not feel he needs to see a cardiologist at the present time.

## 2016-02-10 ENCOUNTER — Other Ambulatory Visit: Payer: Self-pay | Admitting: *Deleted

## 2016-02-10 DIAGNOSIS — I739 Peripheral vascular disease, unspecified: Secondary | ICD-10-CM

## 2016-02-17 ENCOUNTER — Telehealth: Payer: Self-pay | Admitting: Family Medicine

## 2016-02-17 NOTE — Telephone Encounter (Signed)
Pt states that he had a bad nose bleed

## 2016-02-19 ENCOUNTER — Other Ambulatory Visit: Payer: Self-pay | Admitting: Family Medicine

## 2016-02-20 ENCOUNTER — Other Ambulatory Visit: Payer: Self-pay | Admitting: Family Medicine

## 2016-03-21 ENCOUNTER — Other Ambulatory Visit: Payer: Self-pay | Admitting: Family Medicine

## 2016-03-30 ENCOUNTER — Encounter: Payer: Self-pay | Admitting: Family Medicine

## 2016-03-30 ENCOUNTER — Ambulatory Visit
Admission: RE | Admit: 2016-03-30 | Discharge: 2016-03-30 | Disposition: A | Discharge status: Home / Self Care | Payer: PPO | Source: Ambulatory Visit | Attending: Family Medicine | Admitting: Family Medicine

## 2016-03-30 ENCOUNTER — Ambulatory Visit (INDEPENDENT_AMBULATORY_CARE_PROVIDER_SITE_OTHER): Payer: PPO | Admitting: Family Medicine

## 2016-03-30 VITALS — BP 160/94 | HR 80 | Temp 97.6°F | Resp 22 | Ht 66.5 in | Wt 148.0 lb

## 2016-03-30 DIAGNOSIS — D539 Nutritional anemia, unspecified: Secondary | ICD-10-CM | POA: Diagnosis not present

## 2016-03-30 DIAGNOSIS — R0602 Shortness of breath: Secondary | ICD-10-CM

## 2016-03-30 DIAGNOSIS — R079 Chest pain, unspecified: Secondary | ICD-10-CM | POA: Diagnosis not present

## 2016-03-30 LAB — HEMOGLOBIN, FINGERSTICK: Hemoglobin, fingerstick: 10.1 g/dL — ABNORMAL LOW (ref 14.0–18.0)

## 2016-03-30 NOTE — Progress Notes (Signed)
Subjective:    Patient ID: Mark [name], male    DOB: 25-Jun-1946, 70 y.o.   MRN: [id number]  HPI 04/2015 Physical very pleasant 70 year old white male who has a history of COPD, congestive heart failure which is diastolic in nature, chronic kidney disease stage IV, diabetes mellitus, and hypertension. Ever since the patient discontinued his amiodarone, the patient's breathing has continued to improve. He has not had any recent "COPD exacerbations". I was frequently seen the patient for steroid-dependent COPD exacerbations in the past. This has improved dramatically. Patient states he is not even using his oxygen at home. Today he is 98% on room air. With ambulation for more than 200 feet, the patient's oxygen drops to 92% on room air. Patient no longer requires oxygen. His heart rate is in normal sinus rhythm. He is currently only taking an aspirin for stroke prevention. The patient has a history of paroxysmal atrial fibrillation however his one episode was in the hospital coupled with a respiratory infection. Since that time he has had no documented evidence of A. Fib. Because of his chronic kidney disease, the patient cannot tolerate other anticoagulates and therefore he was started on aspirin. His heart rate is well controlled today at 60 bpm. His blood pressure is borderline at 142/80. He is overdue for a check of his diabetes as well as a recheck of his kidney function and his potassium. He is also due to check his cholesterol.  At that time, my plan was: Patient's blood pressure is adequate. I will recheck his kidney function to ensure stability. I will also monitor his potassium. I will also check a hemoglobin A1c to monitor the control of his diabetes. His last hemoglobin A1c was 7.0. I will update the patient's immunizations today and give him Prevnar 13. Patient's breathing is doing extremely well since he discontinued amiodarone. Therefore I will discontinue the patient's oxygen. Currently the  patient is normal sinus rhythm. He is appropriately anticoagulated with aspirin given his chronic kidney disease. His heart rate is controlled and he is in normal sinus rhythm. I will monitor a CBC to monitor for anemia associated with chronic kidney disease.  10/20/15 The patient continues to do amazingly well. He is no on oxygen. He is not pain anymore and he is not breathing and saw dust and paint fumes. Since he has stopped this his breathing continues to improve. He is compliant taking Symbicort on a daily basis. He also uses his rescue inhaler on average once a day. With this combination, the patient is actually occasionally jogging up and down his driveway. He has not had any COPD exacerbations in the last 6 months. I am very impressed by this. Furthermore he shows no evidence of diastolic heart failure. He has no evidence of pulmonary edema. There are no peripheral edema. There is no evidence of fluid overload. He is overdue check his diabetes. His diabetic eye exam was performed last year and is up-to-date. He has had Pneumovax along with Prevnar. He received his flu shot at [facility] this year. He is overdue to check a hemoglobin A1c and a fasting lipid panel and a urine microalbumin. His only concern today is gas. He reports excessive flatus. He denies any constipation. He denies any diarrhea. He denies any black tarry stools. He denies any blood in his stool. He denies any nausea or vomiting or indigestion. He denies any heartburn. He denies any hematemesis. He denies any weight loss. There are no particular foods that seem to  exacerbate the problem.  At that time, my plan was: I'm very impressed by how he was doing regarding his COPD. Continue Symbicort. There is no indication for oxygen. He can continue to use his rescue albuterol up to 4 times a day as needed. Immunizations are up-to-date. I will monitor his kidney function and also check a urine microalbumin. Continue aspirin for paroxysmal atrial  fibrillation although today he is in normal sinus rhythm and his heart rate is well controlled. I will check a fasting lipid panel with a goal LDL cholesterol less than 161. I will also check an A1c with a goal hemoglobin A1c less than 6.5. I have asked the patient to try a probiotic, Restora, once a day for gas. If symptoms are not improving after 2 weeks, I will proceed with further workup.  01/10/16 We received a call from his home health nurse who reported that the patient had an irregular heartbeat. He does have a history of paroxysmal atrial fibrillation. In the past he was one documented episode around the time of a pulmonary complication. The patient remained in normal sinus rhythm for months afterwards and therefore was felt to be temporary and his amiodarone and anticoagulation were discontinued. Currently the patient is on aspirin alone.  Unfortunately today on his EKG he appears to be in atrial fibrillation with no definitive P wave morphology that is consistent. His pulse is irregularly irregular.  CHADS2 score  Is 3 given his diabetes, hypertension, and history of congestive heart failure.  At that time, my plan was: EKG does show atrial fibrillation. This is confirmed on examination. Aspirin 81 mg daily is not sufficient given his chads score. I recommended switching back to Xarelto. However given his chronic kidney disease with a glomerular filtration rate of approximately 30 mL/m, the patient needs to be on 15 mg a Xarelto once a day rather than 20. I sent this prescription to his pharmacy. Discontinue aspirin. Recheck in 3 months  03/30/16 Patient presents with 1-2 weeks of increasing dyspnea on exertion. He is also having random episodic chest pain on the left side of his chest. The pain is sharp sudden. It is unprovoked. There is no relationship to exertion. It resolves usually a few seconds without reason. However the dyspnea is what brought him to the doctor's office. Previously he was  doing well however now with minimal activity, his pulse oximetry would drop to 60%. He check this at home himself today after walking back from the mailbox. At rest in my office he is 90% on room air. He does have trace to +1 pitting edema in both legs. His weight is up 3 pounds from his office visit in April. There is some mild abdominal distention. There is no obvious JVD. However his heart rate is irregularly irregular and he is tachycardic as high as 120 bpm on my exam. Past Medical History  Diagnosis Date  . GERD (gastroesophageal reflux disease)   . Hyperlipidemia   . H/O hiatal hernia   . Hypertension     Sees Dr. Lynnea Ferrier  . AAA (abdominal aortic aneurysm) (HCC)     a. 09/2013: Resection and grafting of abdominal aortic aneurysm with insertion of an aorto by common iliac graft using 18 x 9 mm Hemashield Dacron graft.  Marland Kitchen COPD (chronic obstructive pulmonary disease) (HCC)   . Chronic diastolic CHF (congestive heart failure) (HCC)     a.  Echo (08/13/2013): EF 55-60%, normal wall motion, mild MR, mild LAE  . Pneumonia   .  Arthritis     "little in my hands" (09/08/2013)  . Chronic kidney disease (CKD), stage III (moderate)   . PAF (paroxysmal atrial fibrillation) (HCC)     a. amiodarone Rx started 08/2013;  b. s/p TEE-DCCV 09/2013 => recurrent AFib => repeat DCCV => NSR;   c. Xarelto 15 QD due to CKD  . Hx of cardiovascular stress test     a. Myoview 09/2012:  no scar or ischemia  . Diabetes mellitus without complication (HCC)     TYPE 2   Past Surgical History  Procedure Laterality Date  . Knee arthroscopy Right   . Abdominal aortic aneurysm repair  10/15/2012    Procedure: ANEURYSM ABDOMINAL AORTIC REPAIR;  Surgeon: Pryor Ochoa, MD;  Location: [facility] OR;  Service: Vascular;  Laterality: N/A;  Resection and Grafting of Abdominal Aortic Aneurysm - Aortobi-Iliac   . Tee without cardioversion N/A 09/22/2013    Procedure: TRANSESOPHAGEAL ECHOCARDIOGRAM (TEE);  Surgeon: Lars Masson, MD;  Location: [facility] ENDOSCOPY;  Service: Cardiovascular;  Laterality: N/A;  . Cardioversion N/A 09/22/2013    Procedure: CARDIOVERSION;  Surgeon: Lars Masson, MD;  Location: [facility] ENDOSCOPY;  Service: Cardiovascular;  Laterality: N/A;  . Abdominal aortic aneurysm repair  10-15-2012  . Cardioversion  Jan. 12, 2015    Dr. Doylene Canning. Ladona Ridgel  . Cardioversion N/A 09/28/2013    Procedure: CARDIOVERSION;  Surgeon: Marinus Maw, MD;  Location[facility] CATH LAB;  Service: Cardiovascular;  Laterality: N/A;  . Inguinal hernia repair Right 12/22/2014    Procedure: OPEN RIGHT INGUINAL HERNIA REPAIR WITH MESH;  Surgeon: Axel Filler, MD;  Location: [facility] ORS;  Service: General;  Laterality: Right;  . Insertion of mesh N/A 12/22/2014    Procedure: INSERTION OF MESH;  Surgeon: Axel Filler, MD;  Location: [facility] ORS;  Service: General;  Laterality: N/A;   Current Outpatient Prescriptions on File Prior to Visit  Medication Sig Dispense Refill  . atorvastatin (LIPITOR) 10 MG tablet TAKE 1 TABLET EVERY DAY 90 tablet 3  . diltiazem (TAZTIA XT) 180 MG 24 hr capsule Take 2 capsules (360 mg total) by mouth daily. 60 capsule 5  . furosemide (LASIX) 40 MG tablet TAKE 1 TABLET (40 MG TOTAL) BY MOUTH DAILY. 30 tablet 5  . metoprolol succinate (TOPROL-XL) 25 MG 24 hr tablet TAKE 1 TABLETS BY MOUTH EVERY DAY TAKE WITH OR IMMEDIATELY AFTER BREAKFAST  11  . pantoprazole (PROTONIX) 40 MG tablet TAKE 1 TABLET (40 MG TOTAL) BY MOUTH DAILY. 30 tablet 3  . Rivaroxaban (XARELTO) 15 MG TABS tablet Take 1 tablet (15 mg total) by mouth daily with supper. 30 tablet 11  . SYMBICORT 160-4.5 MCG/ACT inhaler INHALE 2 PUFFS INTO THE LUNGS 2 (TWO) TIMES DAILY. 10.2 Inhaler 11  . tamsulosin (FLOMAX) 0.4 MG CAPS capsule TAKE 1 CAPSULE (0.4 MG TOTAL) BY MOUTH DAILY. 90 capsule 3  . VENTOLIN HFA 108 (90 Base) MCG/ACT inhaler INHALE 2 PUFFS INTO THE LUNGS EVERY 6 (SIX) HOURS AS NEEDED FOR WHEEZING OR SHORTNESS OF BREATH. 18 g 5  . TAZTIA XT 360 MG  24 hr capsule TAKE 1 TABLET EVERY DAY (Patient not taking: Reported on 01/10/2016) 30 capsule 11   No current facility-administered medications on file prior to visit.   Allergies  Allergen Reactions  . Niaspan [Niacin Er] Anaphylaxis   Social History   Social History  . Marital Status: Married    Spouse Name: Nicole Cella  . Number of Children: 4  . Years of Education: N/A  Occupational History  . Works PT as a Education administratorpainter    Social History Main Topics  . Smoking status: Former Smoker -- 1.00 packs/day for 56 years    Types: Cigarettes    Quit date: 07/31/2013  . Smokeless tobacco: Former NeurosurgeonUser    Types: Chew  . Alcohol Use: No  . Drug Use: No  . Sexual Activity: Not Currently   Other Topics Concern  . Not on file   Social History Narrative   Married.  Lives with wife.  Ambulates without assistance.      Review of Systems  All other systems reviewed and are negative.      Objective:   Physical Exam  Constitutional: He appears well-developed and well-nourished.  Cardiovascular: Normal rate and normal heart sounds.  An irregularly irregular rhythm present. Exam reveals no gallop and no friction rub.   No murmur heard. Pulmonary/Chest: Effort normal. No respiratory distress. He has no wheezes. He has rales.  Abdominal: Soft. Bowel sounds are normal. He exhibits no distension. There is no tenderness. There is no rebound and no guarding.  Musculoskeletal: He exhibits edema.  Vitals reviewed.         Assessment & Plan:  SOB (shortness of breath) - Plan: EKG 12-Lead  Chest pain, unspecified chest pain type - Plan: EKG 12-Lead  EKG today reveals atrial fibrillation. There are nonspecific ST changes in the inferolateral leads but this is chronic. Otherwise there are no acute changes on his EKG.  The patient does have trace bipedal edema.  On his examination he also has faint right basilar posterior Rales. However he does not look markedly fluid overloaded. I'm also  concerned that with his COPD, he could be experiencing dyspnea on exertion and hypoxia particularly due to the hot weather. Also I performed a fingerstick hemoglobin today. His hemoglobin has dropped from 13 in February to 10.1 today. He denies any blood loss that he is aware of. His dyspnea may be multifactorial related to rapid heart rate, pulmonary edema, COPD, hot weather, and anemia. I will first obtain a chest x-ray. There is pulmonary edema, I will diurese the patient. Depending on the chest x-ray, I also would like to start the patient on prednisone for COPD exacerbation. I would also want to regulate his heart rate better to help his dyspnea and also treat his underlying anemia.  Await the results of the chest x-ray to determine the plan.

## 2016-03-31 LAB — ANEMIA PANEL
%SAT: 11 % — ABNORMAL LOW (ref 15–60)
ABS RETIC: 45320 {cells}/uL (ref 25000–90000)
FERRITIN: 172 ng/mL (ref 20–380)
Folate: 16.7 ng/mL (ref >5.4)
IRON: 27 ug/dL — AB (ref 50–180)
RBC.: 4.12 MIL/uL — ABNORMAL LOW (ref 4.20–5.80)
RETIC CT PCT: 1.1 %
TIBC: 246 ug/dL — ABNORMAL LOW (ref 250–425)
UIBC: 219 ug/dL (ref 125–400)
Vitamin B-12: 629 pg/mL (ref 200–1100)

## 2016-04-02 ENCOUNTER — Ambulatory Visit (INDEPENDENT_AMBULATORY_CARE_PROVIDER_SITE_OTHER): Payer: PPO | Admitting: Family Medicine

## 2016-04-02 ENCOUNTER — Encounter: Payer: Self-pay | Admitting: Family Medicine

## 2016-04-02 VITALS — BP 200/100 | HR 98 | Temp 97.7°F | Resp 18 | Ht 66.5 in | Wt 145.0 lb

## 2016-04-02 DIAGNOSIS — I480 Paroxysmal atrial fibrillation: Secondary | ICD-10-CM

## 2016-04-02 DIAGNOSIS — D509 Iron deficiency anemia, unspecified: Secondary | ICD-10-CM | POA: Diagnosis not present

## 2016-04-02 DIAGNOSIS — R0602 Shortness of breath: Secondary | ICD-10-CM | POA: Diagnosis not present

## 2016-04-02 NOTE — Progress Notes (Signed)
Subjective:    Patient ID: Mark [name], male    DOB: 25-Jun-1946, 70 y.o.   MRN: [id number]  HPI 04/2015 Physical very pleasant 70 year old white male who has a history of COPD, congestive heart failure which is diastolic in nature, chronic kidney disease stage IV, diabetes mellitus, and hypertension. Ever since the patient discontinued his amiodarone, the patient's breathing has continued to improve. He has not had any recent "COPD exacerbations". I was frequently seen the patient for steroid-dependent COPD exacerbations in the past. This has improved dramatically. Patient states he is not even using his oxygen at home. Today he is 98% on room air. With ambulation for more than 200 feet, the patient's oxygen drops to 92% on room air. Patient no longer requires oxygen. His heart rate is in normal sinus rhythm. He is currently only taking an aspirin for stroke prevention. The patient has a history of paroxysmal atrial fibrillation however his one episode was in the hospital coupled with a respiratory infection. Since that time he has had no documented evidence of A. Fib. Because of his chronic kidney disease, the patient cannot tolerate other anticoagulates and therefore he was started on aspirin. His heart rate is well controlled today at 60 bpm. His blood pressure is borderline at 142/80. He is overdue for a check of his diabetes as well as a recheck of his kidney function and his potassium. He is also due to check his cholesterol.  At that time, my plan was: Patient's blood pressure is adequate. I will recheck his kidney function to ensure stability. I will also monitor his potassium. I will also check a hemoglobin A1c to monitor the control of his diabetes. His last hemoglobin A1c was 7.0. I will update the patient's immunizations today and give him Prevnar 13. Patient's breathing is doing extremely well since he discontinued amiodarone. Therefore I will discontinue the patient's oxygen. Currently the  patient is normal sinus rhythm. He is appropriately anticoagulated with aspirin given his chronic kidney disease. His heart rate is controlled and he is in normal sinus rhythm. I will monitor a CBC to monitor for anemia associated with chronic kidney disease.  10/20/15 The patient continues to do amazingly well. He is no on oxygen. He is not pain anymore and he is not breathing and saw dust and paint fumes. Since he has stopped this his breathing continues to improve. He is compliant taking Symbicort on a daily basis. He also uses his rescue inhaler on average once a day. With this combination, the patient is actually occasionally jogging up and down his driveway. He has not had any COPD exacerbations in the last 6 months. I am very impressed by this. Furthermore he shows no evidence of diastolic heart failure. He has no evidence of pulmonary edema. There are no peripheral edema. There is no evidence of fluid overload. He is overdue check his diabetes. His diabetic eye exam was performed last year and is up-to-date. He has had Pneumovax along with Prevnar. He received his flu shot at [facility] this year. He is overdue to check a hemoglobin A1c and a fasting lipid panel and a urine microalbumin. His only concern today is gas. He reports excessive flatus. He denies any constipation. He denies any diarrhea. He denies any black tarry stools. He denies any blood in his stool. He denies any nausea or vomiting or indigestion. He denies any heartburn. He denies any hematemesis. He denies any weight loss. There are no particular foods that seem to  exacerbate the problem.  At that time, my plan was: I'm very impressed by how he was doing regarding his COPD. Continue Symbicort. There is no indication for oxygen. He can continue to use his rescue albuterol up to 4 times a day as needed. Immunizations are up-to-date. I will monitor his kidney function and also check a urine microalbumin. Continue aspirin for paroxysmal atrial  fibrillation although today he is in normal sinus rhythm and his heart rate is well controlled. I will check a fasting lipid panel with a goal LDL cholesterol less than 161100. I will also check an A1c with a goal hemoglobin A1c less than 6.5. I have asked the patient to try a probiotic, Restora, once a day for gas. If symptoms are not improving after 2 weeks, I will proceed with further workup.  01/10/16 We received a call from his home health nurse who reported that the patient had an irregular heartbeat. He does have a history of paroxysmal atrial fibrillation. In the past he was one documented episode around the time of a pulmonary complication. The patient remained in normal sinus rhythm for months afterwards and therefore was felt to be temporary and his amiodarone and anticoagulation were discontinued. Currently the patient is on aspirin alone.  Unfortunately today on his EKG he appears to be in atrial fibrillation with no definitive P wave morphology that is consistent. His pulse is irregularly irregular.  CHADS2 score  Is 3 given his diabetes, hypertension, and history of congestive heart failure.  At that time, my plan was: EKG does show atrial fibrillation. This is confirmed on examination. Aspirin 81 mg daily is not sufficient given his chads score. I recommended switching back to Xarelto. However given his chronic kidney disease with a glomerular filtration rate of approximately 30 mL/m, the patient needs to be on 15 mg a Xarelto once a day rather than 20. I sent this prescription to his pharmacy. Discontinue aspirin. Recheck in 3 months  03/30/16 Patient presents with 1-2 weeks of increasing dyspnea on exertion. He is also having random episodic chest pain on the left side of his chest. The pain is sharp sudden. It is unprovoked. There is no relationship to exertion. It resolves usually a few seconds without reason. However the dyspnea is what brought him to the doctor's office. Previously he was  doing well however now with minimal activity, his pulse oximetry would drop to 60%. He check this at home himself today after walking back from the mailbox. At rest in my office he is 90% on room air. He does have trace to +1 pitting edema in both legs. His weight is up 3 pounds from his office visit in April. There is some mild abdominal distention. There is no obvious JVD. However his heart rate is irregularly irregular and he is tachycardic as high as 120 bpm on my exam.  At that time, my plan was: EKG today reveals atrial fibrillation. There are nonspecific ST changes in the inferolateral leads but this is chronic. Otherwise there are no acute changes on his EKG.  The patient does have trace bipedal edema.  On his examination he also has faint right basilar posterior Rales. However he does not look markedly fluid overloaded. I'm also concerned that with his COPD, he could be experiencing dyspnea on exertion and hypoxia particularly due to the hot weather. Also I performed a fingerstick hemoglobin today. His hemoglobin has dropped from 13 in February to 10.1 today. He denies any blood loss that he  is aware of. His dyspnea may be multifactorial related to rapid heart rate, pulmonary edema, COPD, hot weather, and anemia. I will first obtain a chest x-ray. There is pulmonary edema, I will diurese the patient. Depending on the chest x-ray, I also would like to start the patient on prednisone for COPD exacerbation. I would also want to regulate his heart rate better to help his dyspnea and also treat his underlying anemia.  Await the results of the chest x-ray to determine the plan.  04/02/16 Chest x-ray revealed mild CHF consistent with my exam. Therefore I recommended increasing his Lasix to 40 mg by mouth twice a day and recheck here today. Lab work was also significant for anemia with a hemoglobin around 10. Follow-up lab work revealed low iron and low percent saturation suggesting iron deficiency anemia. He is  here today for follow-up. I reviewed the lab work he had at his nephrologist earlier last week. His creatinine was stable at 2.16-2.76. This is stable for this patient. However today he has lost 3 pounds through diuresis. He states that his breathing is a little bit better. Continues to complain of dyspnea on exertion. With ambulation, his pulse oximetry drops to 91% on room air. However his blood pressure today is extremely elevated and his heart rate today is elevated between 110 and 130 bpm with activity Past Medical History  Diagnosis Date  . GERD (gastroesophageal reflux disease)   . Hyperlipidemia   . H/O hiatal hernia   . Hypertension     Sees Dr. Lynnea Ferrier  . AAA (abdominal aortic aneurysm) (HCC)     a. 09/2013: Resection and grafting of abdominal aortic aneurysm with insertion of an aorto by common iliac graft using 18 x 9 mm Hemashield Dacron graft.  Marland Kitchen COPD (chronic obstructive pulmonary disease) (HCC)   . Chronic diastolic CHF (congestive heart failure) (HCC)     a.  Echo (08/13/2013): EF 55-60%, normal wall motion, mild MR, mild LAE  . Pneumonia   . Arthritis     "little in my hands" (09/08/2013)  . Chronic kidney disease (CKD), stage III (moderate)   . PAF (paroxysmal atrial fibrillation) (HCC)     a. amiodarone Rx started 08/2013;  b. s/p TEE-DCCV 09/2013 => recurrent AFib => repeat DCCV => NSR;   c. Xarelto 15 QD due to CKD  . Hx of cardiovascular stress test     a. Myoview 09/2012:  no scar or ischemia  . Diabetes mellitus without complication (HCC)     TYPE 2   Past Surgical History  Procedure Laterality Date  . Knee arthroscopy Right   . Abdominal aortic aneurysm repair  10/15/2012    Procedure: ANEURYSM ABDOMINAL AORTIC REPAIR;  Surgeon: Pryor Ochoa, MD;  Location: [facility] OR;  Service: Vascular;  Laterality: N/A;  Resection and Grafting of Abdominal Aortic Aneurysm - Aortobi-Iliac   . Tee without cardioversion N/A 09/22/2013    Procedure: TRANSESOPHAGEAL ECHOCARDIOGRAM  (TEE);  Surgeon: Lars Masson, MD;  Location: [facility] ENDOSCOPY;  Service: Cardiovascular;  Laterality: N/A;  . Cardioversion N/A 09/22/2013    Procedure: CARDIOVERSION;  Surgeon: Lars Masson, MD;  Location: [facility] ENDOSCOPY;  Service: Cardiovascular;  Laterality: N/A;  . Abdominal aortic aneurysm repair  10-15-2012  . Cardioversion  Jan. 12, 2015    Dr. Doylene Canning. Ladona Ridgel  . Cardioversion N/A 09/28/2013    Procedure: CARDIOVERSION;  Surgeon: Marinus Maw, MD;  Location: [facility] CATH LAB;  Service: Cardiovascular;  Laterality: N/A;  .  Inguinal hernia repair Right 12/22/2014    Procedure: OPEN RIGHT INGUINAL HERNIA REPAIR WITH MESH;  Surgeon: Axel Filler, MD;  Location: [facility] ORS;  Service: General;  Laterality: Right;  . Insertion of mesh N/A 12/22/2014    Procedure: INSERTION OF MESH;  Surgeon: Axel Filler, MD;  Location: [facility] ORS;  Service: General;  Laterality: N/A;   Current Outpatient Prescriptions on File Prior to Visit  Medication Sig Dispense Refill  . atorvastatin (LIPITOR) 10 MG tablet TAKE 1 TABLET EVERY DAY 90 tablet 3  . diltiazem (TAZTIA XT) 180 MG 24 hr capsule Take 2 capsules (360 mg total) by mouth daily. 60 capsule 5  . furosemide (LASIX) 40 MG tablet TAKE 1 TABLET (40 MG TOTAL) BY MOUTH DAILY. 30 tablet 5  . metoprolol succinate (TOPROL-XL) 25 MG 24 hr tablet TAKE 1 TABLETS BY MOUTH EVERY DAY TAKE WITH OR IMMEDIATELY AFTER BREAKFAST  11  . pantoprazole (PROTONIX) 40 MG tablet TAKE 1 TABLET (40 MG TOTAL) BY MOUTH DAILY. 30 tablet 3  . Rivaroxaban (XARELTO) 15 MG TABS tablet Take 1 tablet (15 mg total) by mouth daily with supper. 30 tablet 11  . SYMBICORT 160-4.5 MCG/ACT inhaler INHALE 2 PUFFS INTO THE LUNGS 2 (TWO) TIMES DAILY. 10.2 Inhaler 11  . tamsulosin (FLOMAX) 0.4 MG CAPS capsule TAKE 1 CAPSULE (0.4 MG TOTAL) BY MOUTH DAILY. 90 capsule 3  . TAZTIA XT 360 MG 24 hr capsule TAKE 1 TABLET EVERY DAY (Patient not taking: Reported on 01/10/2016) 30 capsule 11  . VENTOLIN HFA 108 (90  Base) MCG/ACT inhaler INHALE 2 PUFFS INTO THE LUNGS EVERY 6 (SIX) HOURS AS NEEDED FOR WHEEZING OR SHORTNESS OF BREATH. 18 g 5   No current facility-administered medications on file prior to visit.   Allergies  Allergen Reactions  . Niaspan [Niacin Er] Anaphylaxis   Social History   Social History  . Marital Status: Married    Spouse Name: Nicole Cella  . Number of Children: 4  . Years of Education: N/A   Occupational History  . Works PT as a Education administrator    Social History Main Topics  . Smoking status: Former Smoker -- 1.00 packs/day for 56 years    Types: Cigarettes    Quit date: 07/31/2013  . Smokeless tobacco: Former Neurosurgeon    Types: Chew  . Alcohol Use: No  . Drug Use: No  . Sexual Activity: Not Currently   Other Topics Concern  . Not on file   Social History Narrative   Married.  Lives with wife.  Ambulates without assistance.      Review of Systems  All other systems reviewed and are negative.      Objective:   Physical Exam  Constitutional: He appears well-developed and well-nourished.  Cardiovascular: Normal rate and normal heart sounds.  An irregularly irregular rhythm present. Exam reveals no gallop and no friction rub.   No murmur heard. Pulmonary/Chest: Effort normal. No respiratory distress. He has no wheezes. He has no rales.  Abdominal: Soft. Bowel sounds are normal. He exhibits no distension. There is no tenderness. There is no rebound and no guarding.  Musculoskeletal: He exhibits no edema.  Vitals reviewed.         Assessment & Plan:  Paroxysmal atrial fibrillation (HCC)  SOB (shortness of breath)  Iron deficiency anemia  The edema on his exam has resolved. I believe his shortness of breath is multifactorial and related to mild pulmonary edema, atrial fibrillation with tachycardia, hypertension, iron deficiency anemia coupled with  chronic kidney disease, and COPD in very hot weather. I believe all these factors together are contributing to his  dyspnea. Today I will determine attention in trying to control his heart rate better and dropped his blood pressure. I will increase Toprol-XL to 50 mg by mouth daily and recheck on Friday. Friday I plan to increase to under milligrams a day as long as the patient can tolerate it.

## 2016-04-05 ENCOUNTER — Other Ambulatory Visit: Payer: Self-pay | Admitting: Family Medicine

## 2016-04-06 ENCOUNTER — Ambulatory Visit (INDEPENDENT_AMBULATORY_CARE_PROVIDER_SITE_OTHER): Payer: PPO | Admitting: Family Medicine

## 2016-04-06 ENCOUNTER — Encounter: Payer: Self-pay | Admitting: Family Medicine

## 2016-04-06 VITALS — BP 168/88 | HR 90 | Temp 97.6°F | Resp 20 | Wt 145.0 lb

## 2016-04-06 DIAGNOSIS — I480 Paroxysmal atrial fibrillation: Secondary | ICD-10-CM | POA: Diagnosis not present

## 2016-04-06 MED ORDER — METOPROLOL SUCCINATE ER 100 MG PO TB24: 100.0000 mg | ORAL_TABLET | Freq: Every day | ORAL | Status: DC

## 2016-04-06 NOTE — Progress Notes (Signed)
Subjective:    Patient ID: Mark [name], male    DOB: 25-Jun-1946, 70 y.o.   MRN: [id number]  HPI 04/2015 Physical very pleasant 70 year old white male who has a history of COPD, congestive heart failure which is diastolic in nature, chronic kidney disease stage IV, diabetes mellitus, and hypertension. Ever since the patient discontinued his amiodarone, the patient's breathing has continued to improve. He has not had any recent "COPD exacerbations". I was frequently seen the patient for steroid-dependent COPD exacerbations in the past. This has improved dramatically. Patient states he is not even using his oxygen at home. Today he is 98% on room air. With ambulation for more than 200 feet, the patient's oxygen drops to 92% on room air. Patient no longer requires oxygen. His heart rate is in normal sinus rhythm. He is currently only taking an aspirin for stroke prevention. The patient has a history of paroxysmal atrial fibrillation however his one episode was in the hospital coupled with a respiratory infection. Since that time he has had no documented evidence of A. Fib. Because of his chronic kidney disease, the patient cannot tolerate other anticoagulates and therefore he was started on aspirin. His heart rate is well controlled today at 60 bpm. His blood pressure is borderline at 142/80. He is overdue for a check of his diabetes as well as a recheck of his kidney function and his potassium. He is also due to check his cholesterol.  At that time, my plan was: Patient's blood pressure is adequate. I will recheck his kidney function to ensure stability. I will also monitor his potassium. I will also check a hemoglobin A1c to monitor the control of his diabetes. His last hemoglobin A1c was 7.0. I will update the patient's immunizations today and give him Prevnar 13. Patient's breathing is doing extremely well since he discontinued amiodarone. Therefore I will discontinue the patient's oxygen. Currently the  patient is normal sinus rhythm. He is appropriately anticoagulated with aspirin given his chronic kidney disease. His heart rate is controlled and he is in normal sinus rhythm. I will monitor a CBC to monitor for anemia associated with chronic kidney disease.  10/20/15 The patient continues to do amazingly well. He is no on oxygen. He is not pain anymore and he is not breathing and saw dust and paint fumes. Since he has stopped this his breathing continues to improve. He is compliant taking Symbicort on a daily basis. He also uses his rescue inhaler on average once a day. With this combination, the patient is actually occasionally jogging up and down his driveway. He has not had any COPD exacerbations in the last 6 months. I am very impressed by this. Furthermore he shows no evidence of diastolic heart failure. He has no evidence of pulmonary edema. There are no peripheral edema. There is no evidence of fluid overload. He is overdue check his diabetes. His diabetic eye exam was performed last year and is up-to-date. He has had Pneumovax along with Prevnar. He received his flu shot at [facility] this year. He is overdue to check a hemoglobin A1c and a fasting lipid panel and a urine microalbumin. His only concern today is gas. He reports excessive flatus. He denies any constipation. He denies any diarrhea. He denies any black tarry stools. He denies any blood in his stool. He denies any nausea or vomiting or indigestion. He denies any heartburn. He denies any hematemesis. He denies any weight loss. There are no particular foods that seem to  exacerbate the problem.  At that time, my plan was: I'm very impressed by how he was doing regarding his COPD. Continue Symbicort. There is no indication for oxygen. He can continue to use his rescue albuterol up to 4 times a day as needed. Immunizations are up-to-date. I will monitor his kidney function and also check a urine microalbumin. Continue aspirin for paroxysmal atrial  fibrillation although today he is in normal sinus rhythm and his heart rate is well controlled. I will check a fasting lipid panel with a goal LDL cholesterol less than 161100. I will also check an A1c with a goal hemoglobin A1c less than 6.5. I have asked the patient to try a probiotic, Restora, once a day for gas. If symptoms are not improving after 2 weeks, I will proceed with further workup.  01/10/16 We received a call from his home health nurse who reported that the patient had an irregular heartbeat. He does have a history of paroxysmal atrial fibrillation. In the past he was one documented episode around the time of a pulmonary complication. The patient remained in normal sinus rhythm for months afterwards and therefore was felt to be temporary and his amiodarone and anticoagulation were discontinued. Currently the patient is on aspirin alone.  Unfortunately today on his EKG he appears to be in atrial fibrillation with no definitive P wave morphology that is consistent. His pulse is irregularly irregular.  CHADS2 score  Is 3 given his diabetes, hypertension, and history of congestive heart failure.  At that time, my plan was: EKG does show atrial fibrillation. This is confirmed on examination. Aspirin 81 mg daily is not sufficient given his chads score. I recommended switching back to Xarelto. However given his chronic kidney disease with a glomerular filtration rate of approximately 30 mL/m, the patient needs to be on 15 mg a Xarelto once a day rather than 20. I sent this prescription to his pharmacy. Discontinue aspirin. Recheck in 3 months  03/30/16 Patient presents with 1-2 weeks of increasing dyspnea on exertion. He is also having random episodic chest pain on the left side of his chest. The pain is sharp sudden. It is unprovoked. There is no relationship to exertion. It resolves usually a few seconds without reason. However the dyspnea is what brought him to the doctor's office. Previously he was  doing well however now with minimal activity, his pulse oximetry would drop to 60%. He check this at home himself today after walking back from the mailbox. At rest in my office he is 90% on room air. He does have trace to +1 pitting edema in both legs. His weight is up 3 pounds from his office visit in April. There is some mild abdominal distention. There is no obvious JVD. However his heart rate is irregularly irregular and he is tachycardic as high as 120 bpm on my exam.  At that time, my plan was: EKG today reveals atrial fibrillation. There are nonspecific ST changes in the inferolateral leads but this is chronic. Otherwise there are no acute changes on his EKG.  The patient does have trace bipedal edema.  On his examination he also has faint right basilar posterior Rales. However he does not look markedly fluid overloaded. I'm also concerned that with his COPD, he could be experiencing dyspnea on exertion and hypoxia particularly due to the hot weather. Also I performed a fingerstick hemoglobin today. His hemoglobin has dropped from 13 in February to 10.1 today. He denies any blood loss that he  is aware of. His dyspnea may be multifactorial related to rapid heart rate, pulmonary edema, COPD, hot weather, and anemia. I will first obtain a chest x-ray. There is pulmonary edema, I will diurese the patient. Depending on the chest x-ray, I also would like to start the patient on prednisone for COPD exacerbation. I would also want to regulate his heart rate better to help his dyspnea and also treat his underlying anemia.  Await the results of the chest x-ray to determine the plan.  04/02/16 Chest x-ray revealed mild CHF consistent with my exam. Therefore I recommended increasing his Lasix to 40 mg by mouth twice a day and recheck here today. Lab work was also significant for anemia with a hemoglobin around 10. Follow-up lab work revealed low iron and low percent saturation suggesting iron deficiency anemia. He is  here today for follow-up. I reviewed the lab work he had at his nephrologist earlier last week. His creatinine was stable at 2.16-2.76. This is stable for this patient. However today he has lost 3 pounds through diuresis. He states that his breathing is a little bit better. Continues to complain of dyspnea on exertion. With ambulation, his pulse oximetry drops to 91% on room air. However his blood pressure today is extremely elevated and his heart rate today is elevated between 110 and 130 bpm with activity.  At that time, my plan was: The edema on his exam has resolved. I believe his shortness of breath is multifactorial and related to mild pulmonary edema, atrial fibrillation with tachycardia, hypertension, iron deficiency anemia coupled with chronic kidney disease, and COPD in very hot weather. I believe all these factors together are contributing to his dyspnea. Today I will determine attention in trying to control his heart rate better and dropped his blood pressure. I will increase Toprol-XL to 50 mg by mouth daily and recheck on Friday. Friday I plan to increase to 100 milligrams a day as long as the patient can tolerate it.  04/06/16 Patient is doing much better since we have regulate his heart rate better and treat his pulmonary edema. His weight is stable even on the lower dose of the diuretic now. His heart rate is ranging between 80 and 90. Pulse oximetry is 92% on room air. He was able to walk to the mailbox today without shortness of breath. He seems to be doing better now that he is on iron for the anemia as well as the higher dose of metoprolol to regulate his heart rate. Past Medical History  Diagnosis Date  . GERD (gastroesophageal reflux disease)   . Hyperlipidemia   . H/O hiatal hernia   . Hypertension     Sees Dr. Lynnea Ferrier  . AAA (abdominal aortic aneurysm) (HCC)     a. 09/2013: Resection and grafting of abdominal aortic aneurysm with insertion of an aorto by common iliac graft  using 18 x 9 mm Hemashield Dacron graft.  Marland Kitchen COPD (chronic obstructive pulmonary disease) (HCC)   . Chronic diastolic CHF (congestive heart failure) (HCC)     a.  Echo (08/13/2013): EF 55-60%, normal wall motion, mild MR, mild LAE  . Pneumonia   . Arthritis     "little in my hands" (09/08/2013)  . Chronic kidney disease (CKD), stage III (moderate)   . PAF (paroxysmal atrial fibrillation) (HCC)     a. amiodarone Rx started 08/2013;  b. s/p TEE-DCCV 09/2013 => recurrent AFib => repeat DCCV => NSR;   c. Xarelto 15 QD due to  CKD  . Hx of cardiovascular stress test     a. Myoview 09/2012:  no scar or ischemia  . Diabetes mellitus without complication (HCC)     TYPE 2   Past Surgical History  Procedure Laterality Date  . Knee arthroscopy Right   . Abdominal aortic aneurysm repair  10/15/2012    Procedure: ANEURYSM ABDOMINAL AORTIC REPAIR;  Surgeon: Pryor Ochoa, MD;  Location: [facility] OR;  Service: Vascular;  Laterality: N/A;  Resection and Grafting of Abdominal Aortic Aneurysm - Aortobi-Iliac   . Tee without cardioversion N/A 09/22/2013    Procedure: TRANSESOPHAGEAL ECHOCARDIOGRAM (TEE);  Surgeon: Lars Masson, MD;  Location: [facility] ENDOSCOPY;  Service: Cardiovascular;  Laterality: N/A;  . Cardioversion N/A 09/22/2013    Procedure: CARDIOVERSION;  Surgeon: Lars Masson, MD;  Location: [facility] ENDOSCOPY;  Service: Cardiovascular;  Laterality: N/A;  . Abdominal aortic aneurysm repair  10-15-2012  . Cardioversion  Jan. 12, 2015    Dr. Doylene Canning. Ladona Ridgel  . Cardioversion N/A 09/28/2013    Procedure: CARDIOVERSION;  Surgeon: Marinus Maw, MD;  Location: [facility] CATH LAB;  Service: Cardiovascular;  Laterality: N/A;  . Inguinal hernia repair Right 12/22/2014    Procedure: OPEN RIGHT INGUINAL HERNIA REPAIR WITH MESH;  Surgeon: Axel Filler, MD;  Location: [facility] ORS;  Service: General;  Laterality: Right;  . Insertion of mesh N/A 12/22/2014    Procedure: INSERTION OF MESH;  Surgeon: Axel Filler, MD;  Location: [facility]  ORS;  Service: General;  Laterality: N/A;   Current Outpatient Prescriptions on File Prior to Visit  Medication Sig Dispense Refill  . atorvastatin (LIPITOR) 10 MG tablet TAKE 1 TABLET EVERY DAY 90 tablet 3  . diltiazem (TAZTIA XT) 180 MG 24 hr capsule Take 2 capsules (360 mg total) by mouth daily. 60 capsule 5  . furosemide (LASIX) 40 MG tablet TAKE 1 TABLET (40 MG TOTAL) BY MOUTH DAILY. 30 tablet 5  . metoprolol succinate (TOPROL-XL) 25 MG 24 hr tablet TAKE 1 TABLET BY MOUTH EVERY DAY 90 tablet 2  . pantoprazole (PROTONIX) 40 MG tablet TAKE 1 TABLET (40 MG TOTAL) BY MOUTH DAILY. 30 tablet 3  . Rivaroxaban (XARELTO) 15 MG TABS tablet Take 1 tablet (15 mg total) by mouth daily with supper. 30 tablet 11  . SYMBICORT 160-4.5 MCG/ACT inhaler INHALE 2 PUFFS INTO THE LUNGS 2 (TWO) TIMES DAILY. 10.2 Inhaler 11  . tamsulosin (FLOMAX) 0.4 MG CAPS capsule TAKE ONE CAPSULE BY MOUTH EVERY DAY 90 capsule 3  . TAZTIA XT 360 MG 24 hr capsule TAKE 1 TABLET EVERY DAY (Patient not taking: Reported on 01/10/2016) 30 capsule 11  . VENTOLIN HFA 108 (90 Base) MCG/ACT inhaler INHALE 2 PUFFS INTO THE LUNGS EVERY 6 (SIX) HOURS AS NEEDED FOR WHEEZING OR SHORTNESS OF BREATH. 18 g 5   No current facility-administered medications on file prior to visit.   Allergies  Allergen Reactions  . Niaspan [Niacin Er] Anaphylaxis   Social History   Social History  . Marital Status: Married    Spouse Name: Nicole Cella  . Number of Children: 4  . Years of Education: N/A   Occupational History  . Works PT as a Education administrator    Social History Main Topics  . Smoking status: Former Smoker -- 1.00 packs/day for 56 years    Types: Cigarettes    Quit date: 07/31/2013  . Smokeless tobacco: Former Neurosurgeon    Types: Chew  . Alcohol Use: No  . Drug Use: No  . Sexual  Activity: Not Currently   Other Topics Concern  . Not on file   Social History Narrative   Married.  Lives with wife.  Ambulates without assistance.      Review of  Systems  All other systems reviewed and are negative.      Objective:   Physical Exam  Constitutional: He appears well-developed and well-nourished.  Cardiovascular: Normal rate and normal heart sounds.  An irregularly irregular rhythm present. Exam reveals no gallop and no friction rub.   No murmur heard. Pulmonary/Chest: Effort normal. No respiratory distress. He has no wheezes. He has no rales.  Abdominal: Soft. Bowel sounds are normal. He exhibits no distension. There is no tenderness. There is no rebound and no guarding.  Musculoskeletal: He exhibits no edema.  Vitals reviewed.         Assessment & Plan:  Paroxysmal atrial fibrillation (HCC) - Plan: metoprolol succinate (TOPROL-XL) 100 MG 24 hr tablet, Ambulatory referral to Cardiology  Increase Toprol-XL 100 mg a day. His blood pressure and his heart rate is still elevated. I believe that the battery control his blood pressure and his heart rate more his dyspnea on exertion will improve. His lungs are clear to auscultation bilaterally today and there is no evidence of peripheral edema or pulmonary edema. I truly believe his shortness of breath and dyspnea on exertion with multi-factorial and likely coupled with pulmonary edema secondary to elevated heart rate from unregulated atrial fibrillation coupled with anemia from his chronic kidney disease. Continue the iron to treat the anemia. Increase the Toprol to regulate his heart rate better with a goal heart rate between 60 and 70 bpm. I have recommended stool cards to rule out a GI bleed but now the patients on iron. Therefore I told him to discontinue iron temporarily and then get the stool sample so that we can check him for blood. However I believe the anemia is more likely coming from his chronic kidney disease. I will schedule the patient to see the cardiologist to see if he has any other options for rhythm control. He did not tolerate amiodarone in the past due to worsening of his  respiratory status per his report. I'm not sure if he would be a good candidate for sotalol.

## 2016-04-11 ENCOUNTER — Encounter: Payer: Self-pay | Admitting: Cardiovascular Disease

## 2016-04-11 ENCOUNTER — Ambulatory Visit (INDEPENDENT_AMBULATORY_CARE_PROVIDER_SITE_OTHER): Payer: PPO | Admitting: Cardiovascular Disease

## 2016-04-11 VITALS — BP 156/82 | HR 84 | Ht 67.0 in | Wt 145.4 lb

## 2016-04-11 DIAGNOSIS — I480 Paroxysmal atrial fibrillation: Secondary | ICD-10-CM

## 2016-04-11 DIAGNOSIS — R0789 Other chest pain: Secondary | ICD-10-CM | POA: Diagnosis not present

## 2016-04-11 DIAGNOSIS — R0600 Dyspnea, unspecified: Secondary | ICD-10-CM

## 2016-04-11 DIAGNOSIS — I5032 Chronic diastolic (congestive) heart failure: Secondary | ICD-10-CM

## 2016-04-11 DIAGNOSIS — E785 Hyperlipidemia, unspecified: Secondary | ICD-10-CM

## 2016-04-11 NOTE — Progress Notes (Signed)
Chief Complaint  Patient presents with  . Follow-up  . Chest Pain    a little chest pain with no energy  . Shortness of Breath    some SOB    History of Present Illness: 70 yo male with history of paroxysmal atrial fibrillation, AAA, chronic diastolic CHF, GERD, HTN, HLD who is here today for cardiac follow up. He has has been followed by Dr. Patty Sermons. He was admitted in November 2014 with COPD and atrial fib withi RVR. Echo showed normal LV function. He has been managed on Xarelto which was stopped for some time due to recurrent epistaxis. He has had issues with diastolic CHF. DCCV was attempted several times in January 2015 while on amiodarone and he maintained sinus for the last 2 years. He has been managed on daily Lasix. He has chronic kidney disease followed by Dr. Allena Katz. He did well until July 2017 when he was seen in primary care by Dr. Tanya Nones with dyspnea. He was volume overloaded and his Lasix was increased to 40 mg twice daily. He was back in atrial fibrillation with RVR. Toprol was increased to 100 mg daily. He has been on Xarelto. He has anemia felt to be due to his chronic kidney disease. Of note, he has not tolerated amiodarone in the past due to worsening respiratory issues.   He is here today for cardiac follow up. He notes mild improvement in dyspnea with increased Lasix dose and heart rate control. He has had several episodes of chest pain over the last few weeks. This worsens his dyspnea. No near syncope or syncope but he can feel his heart is irregular.   Primary Care Physician: Leo Grosser, MD   Past Medical History:  Diagnosis Date  . AAA (abdominal aortic aneurysm) (HCC)    a. 09/2013: Resection and grafting of abdominal aortic aneurysm with insertion of an aorto by common iliac graft using 18 x 9 mm Hemashield Dacron graft.  . Arthritis    "little in my hands" (09/08/2013)  . Chronic diastolic CHF (congestive heart failure) (HCC)    a.  Echo (08/13/2013): EF  55-60%, normal wall motion, mild MR, mild LAE  . Chronic kidney disease (CKD), stage III (moderate)   . COPD (chronic obstructive pulmonary disease) (HCC)   . 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 GERD (gastroesophageal reflux disease)   . H/O hiatal hernia   . Hx of cardiovascular stress test    a. Myoview 09/2012:  no scar or ischemia  . Hyperlipidemia   . Hypertension    Sees Dr. Lynnea Ferrier  . PAF (paroxysmal atrial fibrillation) (HCC)    a. amiodarone Rx started 08/2013;  b. s/p TEE-DCCV 09/2013 => recurrent AFib => repeat DCCV => NSR;   c. Xarelto 15 QD due to CKD  . Pneumonia     Past Surgical History:  Procedure Laterality Date  . ABDOMINAL AORTIC ANEURYSM REPAIR  10/15/2012   Procedure: ANEURYSM ABDOMINAL AORTIC REPAIR;  Surgeon: Pryor Ochoa, MD;  Location: [facility] OR;  Service: Vascular;  Laterality: N/A;  Resection and Grafting of Abdominal Aortic Aneurysm - Aortobi-Iliac   . ABDOMINAL AORTIC ANEURYSM REPAIR  10-15-2012  . CARDIOVERSION N/A 09/22/2013   Procedure: CARDIOVERSION;  Surgeon: Lars Masson, MD;  Location: [facility] ENDOSCOPY;  Service: Cardiovascular;  Laterality: N/A;  . CARDIOVERSION  Jan. 12, 2015   Dr. Doylene Canning. Ladona Ridgel  . CARDIOVERSION N/A 09/28/2013   Procedure: CARDIOVERSION;  Surgeon: Marinus Maw,  MD;  Location: [facility] CATH LAB;  Service: Cardiovascular;  Laterality: N/A;  . INGUINAL HERNIA REPAIR Right 12/22/2014   Procedure: OPEN RIGHT INGUINAL HERNIA REPAIR WITH MESH;  Surgeon: Axel Filler, MD;  Location: [facility] ORS;  Service: General;  Laterality: Right;  . INSERTION OF MESH N/A 12/22/2014   Procedure: INSERTION OF MESH;  Surgeon: Axel Filler, MD;  Location: [facility] ORS;  Service: General;  Laterality: N/A;  . KNEE ARTHROSCOPY Right   . TEE WITHOUT CARDIOVERSION N/A 09/22/2013   Procedure: TRANSESOPHAGEAL ECHOCARDIOGRAM (TEE);  Surgeon: Lars Masson, MD;  Location: [facility] ENDOSCOPY;  Service: Cardiovascular;  Laterality: N/A;    Current  Outpatient Prescriptions  Medication Sig Dispense Refill  . atorvastatin (LIPITOR) 10 MG tablet TAKE 1 TABLET EVERY DAY 90 tablet 3  . diltiazem (TAZTIA XT) 180 MG 24 hr capsule Take 2 capsules (360 mg total) by mouth daily. 60 capsule 5  . furosemide (LASIX) 40 MG tablet TAKE 1 TABLET (40 MG TOTAL) BY MOUTH DAILY. 30 tablet 5  . metoprolol succinate (TOPROL-XL) 100 MG 24 hr tablet Take 1 tablet (100 mg total) by mouth daily. Take with or immediately following a meal. 90 tablet 3  . pantoprazole (PROTONIX) 40 MG tablet TAKE 1 TABLET (40 MG TOTAL) BY MOUTH DAILY. 30 tablet 3  . Rivaroxaban (XARELTO) 15 MG TABS tablet Take 1 tablet (15 mg total) by mouth daily with supper. 30 tablet 11  . SYMBICORT 160-4.5 MCG/ACT inhaler INHALE 2 PUFFS INTO THE LUNGS 2 (TWO) TIMES DAILY. 10.2 Inhaler 11  . tamsulosin (FLOMAX) 0.4 MG CAPS capsule TAKE ONE CAPSULE BY MOUTH EVERY DAY 90 capsule 3  . TAZTIA XT 360 MG 24 hr capsule TAKE 1 TABLET EVERY DAY 30 capsule 11  . VENTOLIN HFA 108 (90 Base) MCG/ACT inhaler INHALE 2 PUFFS INTO THE LUNGS EVERY 6 (SIX) HOURS AS NEEDED FOR WHEEZING OR SHORTNESS OF BREATH. 18 g 5   No current facility-administered medications for this visit.     Allergies  Allergen Reactions  . Niaspan [Niacin Er] Anaphylaxis    Social History   Social History  . Marital status: Married    Spouse name: Nicole Cella  . Number of children: 4  . Years of education: N/A   Occupational History  . Works PT as a Education administrator    Social History Main Topics  . Smoking status: Former Smoker    Packs/day: 1.00    Years: 56.00    Types: Cigarettes    Quit date: 07/31/2013  . Smokeless tobacco: Former Neurosurgeon    Types: Chew  . Alcohol use No  . Drug use: No  . Sexual activity: Not Currently   Other Topics Concern  . Not on file   Social History Narrative   Married.  Lives with wife.  Ambulates without assistance.    Family History  Problem Relation Age of Onset  . Diabetes Mother   .  Tuberculosis Mother   . Heart disease Father   . Heart attack Father   . Heart disease Son     Blood clot:  Arm    Review of Systems:  As stated in the HPI and otherwise negative.   BP (!) 156/82 (BP Location: Left Arm, Patient Position: Sitting, Cuff Size: Normal)   Pulse 84   Ht  (1.702 m)   Wt 145 lb 6.4 oz (66 kg)   SpO2 92%   BMI 22.77 kg/m   Physical Examination: General: Well developed, well nourished, NAD  HEENT:  OP clear, mucus membranes moist  SKIN: warm, dry. No rashes. Neuro: No focal deficits  Musculoskeletal: Muscle strength 5/5 all ext  Psychiatric: Mood and affect normal  Neck: No JVD, no carotid bruits, no thyromegaly, no lymphadenopathy.  Lungs:Clear bilaterally, no wheezes, rhonci, crackles Cardiovascular: Irreg irreg with murmurs, gallops or rubs. Abdomen:Soft. Bowel sounds present. Non-tender.  Extremities: No lower extremity edema. Pulses are 2 + in the bilateral DP/PT.  EKG:  EKG is not ordered today. The ekg ordered today demonstrates   Recent Labs: 10/20/2015: ALT 8; BUN 32; Creat 2.29; Platelets 183; Potassium 4.5; Sodium 140 03/30/2016: Hemoglobin, fingerstick 10.1   Lipid Panel    Component Value Date/Time   CHOL 181 10/20/2015 0913   TRIG 95 10/20/2015 0913   HDL 46 10/20/2015 0913   CHOLHDL 3.9 10/20/2015 0913   VLDL 19 10/20/2015 0913   LDLCALC 116 10/20/2015 0913     Wt Readings from Last 3 Encounters:  04/11/16 145 lb 6.4 oz (66 kg)  04/06/16 145 lb (65.8 kg)  04/02/16 145 lb (65.8 kg)     Other studies Reviewed: Additional studies/ records that were reviewed today include: . Review of the above records demonstrates:   Assessment and Plan:   1. Paroxysmal atrial fibrillation: Rate is controlled on Toprol and Diltiazem. He has not tolerated amiodarone in the past. While I think some of his dyspnea is likely due to his atrial fibrillation, I think we need to exclude other causes. For now, would continue rate control with  Toprol and Diltiazem. Will continue Xarelto. If there are no other causes of dyspnea, will need to consider repeat DCCV but not sure what the best therapy would be before cardioversion. I would not use amiodarone again.   2. Chronic diastolic CHF: Volume status seems to be improved with higher dose Lasix and better rate control.   3. HLD: Continue statin. Lipids followed in primary care  4. Chest pain: Will arrange nuclear stress test to exclude ischemia.   5. Dyspnea: Will arrange echo to assess LV function and exclude valvular disease. Stress test to exclude ischemia. May be related to CHF or his atrial fibrillation.   Current medicines are reviewed at length with the patient today.  The patient does not have concerns regarding medicines.  The following changes have been made:  no change  Labs/ tests ordered today include:  No orders of the defined types were placed in this encounter.    Disposition:   FU with me in 3 weeks   Signed, Verne Carrow, MD 04/11/2016 12:06 PM    [facility] [address] [place], [place], [place]  [address] Phone: [phone]; Fax: [phone]

## 2016-04-11 NOTE — Patient Instructions (Signed)
Medication Instructions:  Your physician recommends that you continue on your current medications as directed. Please refer to the Current Medication list given to you today.   Labwork: none  Testing/Procedures: Your physician has requested that you have a lexiscan myoview. For further information please visit [email or web address] Please follow instruction sheet, as given.  Your physician has requested that you have an echocardiogram. Echocardiography is a painless test that uses sound waves to create images of your heart. It provides your doctor with information about the size and shape of your heart and how well your heart's chambers and valves are working. This procedure takes approximately one hour. There are no restrictions for this procedure.    Follow-Up: Your physician recommends that you schedule a follow-up appointment in: about 3 weeks.  This is scheduled for August 17,2017 at 4:00.  Testing will be done prior to this appointment.     Any Other Special Instructions Will Be Listed Below (If Applicable).     If you need a refill on your cardiac medications before your next appointment, please call your pharmacy.

## 2016-04-20 ENCOUNTER — Ambulatory Visit (INDEPENDENT_AMBULATORY_CARE_PROVIDER_SITE_OTHER): Payer: PPO | Admitting: Family Medicine

## 2016-04-20 VITALS — BP 170/90 | HR 100 | Temp 98.2°F | Resp 20 | Ht 66.5 in | Wt 146.0 lb

## 2016-04-20 DIAGNOSIS — E1122 Type 2 diabetes mellitus with diabetic chronic kidney disease: Secondary | ICD-10-CM | POA: Diagnosis not present

## 2016-04-20 DIAGNOSIS — E119 Type 2 diabetes mellitus without complications: Secondary | ICD-10-CM | POA: Diagnosis not present

## 2016-04-20 DIAGNOSIS — I480 Paroxysmal atrial fibrillation: Secondary | ICD-10-CM

## 2016-04-20 DIAGNOSIS — D509 Iron deficiency anemia, unspecified: Secondary | ICD-10-CM | POA: Diagnosis not present

## 2016-04-20 DIAGNOSIS — I10 Essential (primary) hypertension: Secondary | ICD-10-CM

## 2016-04-20 DIAGNOSIS — N184 Chronic kidney disease, stage 4 (severe): Secondary | ICD-10-CM

## 2016-04-20 LAB — COMPLETE METABOLIC PANEL WITH GFR
ALT: 12 U/L (ref 9–46)
AST: 9 U/L — AB (ref 10–35)
Albumin: 4 g/dL (ref 3.6–5.1)
Alkaline Phosphatase: 77 U/L (ref 40–115)
BILIRUBIN TOTAL: 0.6 mg/dL (ref 0.2–1.2)
BUN: 43 mg/dL — ABNORMAL HIGH (ref 7–25)
CO2: 19 mmol/L — AB (ref 20–31)
CREATININE: 2.87 mg/dL — AB (ref 0.70–1.18)
Calcium: 9.3 mg/dL (ref 8.6–10.3)
Chloride: 106 mmol/L (ref 98–110)
GFR, Est African American: 24 mL/min — ABNORMAL LOW (ref >=60)
GFR, Est Non African American: 21 mL/min — ABNORMAL LOW (ref >=60)
GLUCOSE: 110 mg/dL — AB (ref 70–99)
Potassium: 4.7 mmol/L (ref 3.5–5.3)
SODIUM: 139 mmol/L (ref 135–146)
TOTAL PROTEIN: 6.9 g/dL (ref 6.1–8.1)

## 2016-04-20 LAB — LIPID PANEL
CHOL/HDL RATIO: 2.8 ratio (ref <=5.0)
CHOLESTEROL: 123 mg/dL — AB (ref 125–200)
HDL: 44 mg/dL (ref >=40)
LDL Cholesterol: 56 mg/dL (ref <130)
Triglycerides: 113 mg/dL (ref <150)
VLDL: 23 mg/dL (ref <30)

## 2016-04-20 LAB — HEMOGLOBIN A1C
HEMOGLOBIN A1C: 6.4 % — AB (ref <5.7)
MEAN PLASMA GLUCOSE: 137 mg/dL

## 2016-04-20 MED ORDER — DOXAZOSIN MESYLATE 4 MG PO TABS: 4.0000 mg | ORAL_TABLET | Freq: Every day | ORAL | 2 refills | Status: DC

## 2016-04-20 NOTE — Progress Notes (Signed)
Subjective:    Patient ID: Mark [name], male    DOB: Sep 27, 1945, 70 y.o.   MRN: [id number]  HPI8/2016 Physical very pleasant 70 year old white male who has a history of COPD, congestive heart failure which is diastolic in nature, chronic kidney disease stage IV, diabetes mellitus, and hypertension. Ever since the patient discontinued his amiodarone, the patient's breathing has continued to improve. He has not had any recent "COPD exacerbations". I was frequently seen the patient for steroid-dependent COPD exacerbations in the past. This has improved dramatically. Patient states he is not even using his oxygen at home. Today he is 98% on room air. With ambulation for more than 200 feet, the patient's oxygen drops to 92% on room air. Patient no longer requires oxygen. His heart rate is in normal sinus rhythm. He is currently only taking an aspirin for stroke prevention. The patient has a history of paroxysmal atrial fibrillation however his one episode was in the hospital coupled with a respiratory infection. Since that time he has had no documented evidence of A. Fib. Because of his chronic kidney disease, the patient cannot tolerate other anticoagulates and therefore he was started on aspirin. His heart rate is well controlled today at 60 bpm. His blood pressure is borderline at 142/80. He is overdue for a check of his diabetes as well as a recheck of his kidney function and his potassium. He is also due to check his cholesterol.  At that time, my plan was: Patient's blood pressure is adequate. I will recheck his kidney function to ensure stability. I will also monitor his potassium. I will also check a hemoglobin A1c to monitor the control of his diabetes. His last hemoglobin A1c was 7.0. I will update the patient's immunizations today and give him Prevnar 13. Patient's breathing is doing extremely well since he discontinued amiodarone. Therefore I will discontinue the patient's oxygen. Currently the  patient is normal sinus rhythm. He is appropriately anticoagulated with aspirin given his chronic kidney disease. His heart rate is controlled and he is in normal sinus rhythm. I will monitor a CBC to monitor for anemia associated with chronic kidney disease.  10/20/15 The patient continues to do amazingly well. He is no on oxygen. He is not pain anymore and he is not breathing and saw dust and paint fumes. Since he has stopped this his breathing continues to improve. He is compliant taking Symbicort on a daily basis. He also uses his rescue inhaler on average once a day. With this combination, the patient is actually occasionally jogging up and down his driveway. He has not had any COPD exacerbations in the last 6 months. I am very impressed by this. Furthermore he shows no evidence of diastolic heart failure. He has no evidence of pulmonary edema. There are no peripheral edema. There is no evidence of fluid overload. He is overdue check his diabetes. His diabetic eye exam was performed last year and is up-to-date. He has had Pneumovax along with Prevnar. He received his flu shot at [facility] this year. He is overdue to check a hemoglobin A1c and a fasting lipid panel and a urine microalbumin. His only concern today is gas. He reports excessive flatus. He denies any constipation. He denies any diarrhea. He denies any black tarry stools. He denies any blood in his stool. He denies any nausea or vomiting or indigestion. He denies any heartburn. He denies any hematemesis. He denies any weight loss. There are no particular foods that seem to exacerbate  the problem.  At that time, my plan was: I'm very impressed by how he was doing regarding his COPD. Continue Symbicort. There is no indication for oxygen. He can continue to use his rescue albuterol up to 4 times a day as needed. Immunizations are up-to-date. I will monitor his kidney function and also check a urine microalbumin. Continue aspirin for paroxysmal atrial  fibrillation although today he is in normal sinus rhythm and his heart rate is well controlled. I will check a fasting lipid panel with a goal LDL cholesterol less than 161. I will also check an A1c with a goal hemoglobin A1c less than 6.5. I have asked the patient to try a probiotic, Restora, once a day for gas. If symptoms are not improving after 2 weeks, I will proceed with further workup.  01/10/16 We received a call from his home health nurse who reported that the patient had an irregular heartbeat. He does have a history of paroxysmal atrial fibrillation. In the past he was one documented episode around the time of a pulmonary complication. The patient remained in normal sinus rhythm for months afterwards and therefore was felt to be temporary and his amiodarone and anticoagulation were discontinued. Currently the patient is on aspirin alone.  Unfortunately today on his EKG he appears to be in atrial fibrillation with no definitive P wave morphology that is consistent. His pulse is irregularly irregular.  CHADS2 score  Is 3 given his diabetes, hypertension, and history of congestive heart failure.  At that time, my plan was: EKG does show atrial fibrillation. This is confirmed on examination. Aspirin 81 mg daily is not sufficient given his chads score. I recommended switching back to Xarelto. However given his chronic kidney disease with a glomerular filtration rate of approximately 30 mL/m, the patient needs to be on 15 mg a Xarelto once a day rather than 20. I sent this prescription to his pharmacy. Discontinue aspirin. Recheck in 3 months  03/30/16 Patient presents with 1-2 weeks of increasing dyspnea on exertion. He is also having random episodic chest pain on the left side of his chest. The pain is sharp sudden. It is unprovoked. There is no relationship to exertion. It resolves usually a few seconds without reason. However the dyspnea is what brought him to the doctor's office. Previously he was  doing well however now with minimal activity, his pulse oximetry would drop to 60%. He check this at home himself today after walking back from the mailbox. At rest in my office he is 90% on room air. He does have trace to +1 pitting edema in both legs. His weight is up 3 pounds from his office visit in April. There is some mild abdominal distention. There is no obvious JVD. However his heart rate is irregularly irregular and he is tachycardic as high as 120 bpm on my exam.  At that time, my plan was: EKG today reveals atrial fibrillation. There are nonspecific ST changes in the inferolateral leads but this is chronic. Otherwise there are no acute changes on his EKG.  The patient does have trace bipedal edema.  On his examination he also has faint right basilar posterior Rales. However he does not look markedly fluid overloaded. I'm also concerned that with his COPD, he could be experiencing dyspnea on exertion and hypoxia particularly due to the hot weather. Also I performed a fingerstick hemoglobin today. His hemoglobin has dropped from 13 in February to 10.1 today. He denies any blood loss that he is  aware of. His dyspnea may be multifactorial related to rapid heart rate, pulmonary edema, COPD, hot weather, and anemia. I will first obtain a chest x-ray. There is pulmonary edema, I will diurese the patient. Depending on the chest x-ray, I also would like to start the patient on prednisone for COPD exacerbation. I would also want to regulate his heart rate better to help his dyspnea and also treat his underlying anemia.  Await the results of the chest x-ray to determine the plan.  04/02/16 Chest x-ray revealed mild CHF consistent with my exam. Therefore I recommended increasing his Lasix to 40 mg by mouth twice a day and recheck here today. Lab work was also significant for anemia with a hemoglobin around 10. Follow-up lab work revealed low iron and low percent saturation suggesting iron deficiency anemia. He is  here today for follow-up. I reviewed the lab work he had at his nephrologist earlier last week. His creatinine was stable at 2.16-2.76. This is stable for this patient. However today he has lost 3 pounds through diuresis. He states that his breathing is a little bit better. Continues to complain of dyspnea on exertion. With ambulation, his pulse oximetry drops to 91% on room air. However his blood pressure today is extremely elevated and his heart rate today is elevated between 110 and 130 bpm with activity.  At that time, my plan was: The edema on his exam has resolved. I believe his shortness of breath is multifactorial and related to mild pulmonary edema, atrial fibrillation with tachycardia, hypertension, iron deficiency anemia coupled with chronic kidney disease, and COPD in very hot weather. I believe all these factors together are contributing to his dyspnea. Today I will determine attention in trying to control his heart rate better and dropped his blood pressure. I will increase Toprol-XL to 50 mg by mouth daily and recheck on Friday. Friday I plan to increase to 100 milligrams a day as long as the patient can tolerate it.  04/06/16 Patient is doing much better since we have regulate his heart rate better and treat his pulmonary edema. His weight is stable even on the lower dose of the diuretic now. His heart rate is ranging between 80 and 90. Pulse oximetry is 92% on room air. He was able to walk to the mailbox today without shortness of breath. He seems to be doing better now that he is on iron for the anemia as well as the higher dose of metoprolol to regulate his heart rate.  At that time, my plan was:  Increase Toprol-XL 100 mg a day. His blood pressure and his heart rate is still elevated. I believe that the battery control his blood pressure and his heart rate more his dyspnea on exertion will improve. His lungs are clear to auscultation bilaterally today and there is no evidence of peripheral  edema or pulmonary edema. I truly believe his shortness of breath and dyspnea on exertion with multi-factorial and likely coupled with pulmonary edema secondary to elevated heart rate from unregulated atrial fibrillation coupled with anemia from his chronic kidney disease. Continue the iron to treat the anemia. Increase the Toprol to regulate his heart rate better with a goal heart rate between 60 and 70 bpm. I have recommended stool cards to rule out a GI bleed but now the patients on iron. Therefore I told him to discontinue iron temporarily and then get the stool sample so that we can check him for blood. However I believe the anemia is  more likely coming from his chronic kidney disease. I will schedule the patient to see the cardiologist to see if he has any other options for rhythm control. He did not tolerate amiodarone in the past due to worsening of his respiratory status per his report. I'm not sure if he would be a good candidate for sotalol.  04/20/16 I reviewed his most recent office visit with his cardiologist Dr. Sanjuana Kava.  They're planning a stress test to rule out ischemic causes of dyspnea on exertion as well as an echocardiogram to evaluate left ventricular systolic function. Plan is to proceed with cardioversion if the patient passes these test or rather there is no other explanation for dyspnea on exertion based on these test. These are scheduled for next week. Patient continues to complain of dyspnea on exertion. Today on his examination his blood pressure significantly elevated. It was also elevated at his cardiologist's office. He is already on high-dose beta blocker as well as Cardizem. He cannot tolerate hydrochlorothiazide due to chronic kidney disease. He is also not done well on ACE inhibitors and angiotensin receptor blockers in the past due to his chronic kidney disease Past Medical History:  Diagnosis Date  . AAA (abdominal aortic aneurysm) (HCC)    a. 09/2013: Resection and  grafting of abdominal aortic aneurysm with insertion of an aorto by common iliac graft using 18 x 9 mm Hemashield Dacron graft.  . Arthritis    "little in my hands" (09/08/2013)  . Chronic diastolic CHF (congestive heart failure) (HCC)    a.  Echo (08/13/2013): EF 55-60%, normal wall motion, mild MR, mild LAE  . Chronic kidney disease (CKD), stage III (moderate)   . COPD (chronic obstructive pulmonary disease) (HCC)   . 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 GERD (gastroesophageal reflux disease)   . H/O hiatal hernia   . Hx of cardiovascular stress test    a. Myoview 09/2012:  no scar or ischemia  . Hyperlipidemia   . Hypertension    Sees Dr. Lynnea Ferrier  . PAF (paroxysmal atrial fibrillation) (HCC)    a. amiodarone Rx started 08/2013;  b. s/p TEE-DCCV 09/2013 => recurrent AFib => repeat DCCV => NSR;   c. Xarelto 15 QD due to CKD  . Pneumonia    Past Surgical History:  Procedure Laterality Date  . ABDOMINAL AORTIC ANEURYSM REPAIR  10/15/2012   Procedure: ANEURYSM ABDOMINAL AORTIC REPAIR;  Surgeon: Pryor Ochoa, MD;  Location: [facility] OR;  Service: Vascular;  Laterality: N/A;  Resection and Grafting of Abdominal Aortic Aneurysm - Aortobi-Iliac   . ABDOMINAL AORTIC ANEURYSM REPAIR  10-15-2012  . CARDIOVERSION N/A 09/22/2013   Procedure: CARDIOVERSION;  Surgeon: Lars Masson, MD;  Location[facility] ENDOSCOPY;  Service: Cardiovascular;  Laterality: N/A;  . CARDIOVERSION  Jan. 12, 2015   Dr. Doylene Canning. Ladona Ridgel  . CARDIOVERSION N/A 09/28/2013   Procedure: CARDIOVERSION;  Surgeon: Marinus Maw, MD;  Location: [facility] CATH LAB;  Service: Cardiovascular;  Laterality: N/A;  . INGUINAL HERNIA REPAIR Right 12/22/2014   Procedure: OPEN RIGHT INGUINAL HERNIA REPAIR WITH MESH;  Surgeon: Axel Filler, MD;  Location: [facility] ORS;  Service: General;  Laterality: Right;  . INSERTION OF MESH N/A 12/22/2014   Procedure: INSERTION OF MESH;  Surgeon: Axel Filler, MD;  Location: [facility] ORS;  Service:  General;  Laterality: N/A;  . KNEE ARTHROSCOPY Right   . TEE WITHOUT CARDIOVERSION N/A 09/22/2013   Procedure: TRANSESOPHAGEAL ECHOCARDIOGRAM (TEE);  Surgeon: Aris Lot  San Morelle, MD;  Location: [facility] ENDOSCOPY;  Service: Cardiovascular;  Laterality: N/A;   Current Outpatient Prescriptions on File Prior to Visit  Medication Sig Dispense Refill  . atorvastatin (LIPITOR) 10 MG tablet TAKE 1 TABLET EVERY DAY 90 tablet 3  . diltiazem (TAZTIA XT) 180 MG 24 hr capsule Take 2 capsules (360 mg total) by mouth daily. 60 capsule 5  . furosemide (LASIX) 40 MG tablet TAKE 1 TABLET (40 MG TOTAL) BY MOUTH DAILY. 30 tablet 5  . metoprolol succinate (TOPROL-XL) 100 MG 24 hr tablet Take 1 tablet (100 mg total) by mouth daily. Take with or immediately following a meal. 90 tablet 3  . pantoprazole (PROTONIX) 40 MG tablet TAKE 1 TABLET (40 MG TOTAL) BY MOUTH DAILY. 30 tablet 3  . Rivaroxaban (XARELTO) 15 MG TABS tablet Take 1 tablet (15 mg total) by mouth daily with supper. 30 tablet 11  . SYMBICORT 160-4.5 MCG/ACT inhaler INHALE 2 PUFFS INTO THE LUNGS 2 (TWO) TIMES DAILY. 10.2 Inhaler 11  . tamsulosin (FLOMAX) 0.4 MG CAPS capsule TAKE ONE CAPSULE BY MOUTH EVERY DAY 90 capsule 3  . VENTOLIN HFA 108 (90 Base) MCG/ACT inhaler INHALE 2 PUFFS INTO THE LUNGS EVERY 6 (SIX) HOURS AS NEEDED FOR WHEEZING OR SHORTNESS OF BREATH. 18 g 5   No current facility-administered medications on file prior to visit.    Allergies  Allergen Reactions  . Niaspan [Niacin Er] Anaphylaxis   Social History   Social History  . Marital status: Married    Spouse name: Nicole Cella  . Number of children: 4  . Years of education: N/A   Occupational History  . Works PT as a Education administrator    Social History Main Topics  . Smoking status: Former Smoker    Packs/day: 1.00    Years: 56.00    Types: Cigarettes    Quit date: 07/31/2013  . Smokeless tobacco: Former Neurosurgeon    Types: Chew  . Alcohol use No  . Drug use: No  . Sexual activity: Not Currently    Other Topics Concern  . Not on file   Social History Narrative   Married.  Lives with wife.  Ambulates without assistance.      Review of Systems  All other systems reviewed and are negative.      Objective:   Physical Exam  Constitutional: He appears well-developed and well-nourished.  Cardiovascular: Normal rate and normal heart sounds.  An irregularly irregular rhythm present. Exam reveals no gallop and no friction rub.   No murmur heard. Pulmonary/Chest: Effort normal. No respiratory distress. He has no wheezes. He has no rales.  Abdominal: Soft. Bowel sounds are normal. He exhibits no distension. There is no tenderness. There is no rebound and no guarding.  Musculoskeletal: He exhibits no edema.  Vitals reviewed.         Assessment & Plan:  Diabetes mellitus type II, non insulin dependent (HCC) - Plan: COMPLETE METABOLIC PANEL WITH GFR, Lipid panel, Hemoglobin A1c  Paroxysmal atrial fibrillation (HCC)  Controlled type 2 diabetes mellitus with stage 4 chronic kidney disease, without long-term current use of insulin (HCC)  CKD (chronic kidney disease) stage 4, GFR 15-29 ml/min (HCC)  Iron deficiency anemia  Benign essential HTN his blood pressure is too high today. I recommended discontinuing Flomax and replacing with Cardura 4 mg by mouth daily and recheck blood pressure in 2 weeks. I believe that his anemia is a combination of iron deficiency coupled with chronic kidney disease. However he is  still not brought back the stool cards. I recommended discontinuing iron for a few days and isn't allowing [place] to perform stool test just to be thorough and rule out an occult GI bleed and the fact he is on anticoagulants. I will then have him resume the iron pills immediately after he performs a stool test. I will check fasting lab work including a fasting lipid panel and hemoglobin A1c. His goal hemoglobin A1c is less than 6.5. His goal LDL cholesterol is less than 100. I wish  the patient the best of luck on his cardiology test next week. I do believe that once we are able to maintain a normal resting heart rate with a stable rhythm, his dyspnea on exertion should hopefully improve

## 2016-04-24 ENCOUNTER — Other Ambulatory Visit: Payer: PPO

## 2016-04-24 ENCOUNTER — Telehealth (HOSPITAL_COMMUNITY): Payer: Self-pay | Admitting: *Deleted

## 2016-04-24 NOTE — Telephone Encounter (Signed)
Patient given detailed instructions per Myocardial Perfusion Study Information Sheet for the test on 04/26/16 at 0945. Patient notified to arrive 15 minutes early and that it is imperative to arrive on time for appointment to keep from having the test rescheduled.  If you need to cancel or reschedule your appointment, please call the office within 24 hours of your appointment. Failure to do so may result in a cancellation of your appointment, and a $50 no show fee. Patient verbalized understanding.Lilyauna Miedema, Adelene IdlerCynthia W

## 2016-04-25 LAB — FECAL OCCULT BLOOD, IMMUNOCHEMICAL: Fecal Occult Blood: POSITIVE — AB

## 2016-04-26 ENCOUNTER — Ambulatory Visit (HOSPITAL_COMMUNITY): Payer: PPO | Attending: Cardiovascular Disease

## 2016-04-26 ENCOUNTER — Ambulatory Visit (HOSPITAL_BASED_OUTPATIENT_CLINIC_OR_DEPARTMENT_OTHER): Payer: PPO

## 2016-04-26 DIAGNOSIS — I071 Rheumatic tricuspid insufficiency: Secondary | ICD-10-CM | POA: Diagnosis not present

## 2016-04-26 DIAGNOSIS — I340 Nonrheumatic mitral (valve) insufficiency: Secondary | ICD-10-CM | POA: Diagnosis not present

## 2016-04-26 DIAGNOSIS — R0600 Dyspnea, unspecified: Secondary | ICD-10-CM | POA: Insufficient documentation

## 2016-04-26 DIAGNOSIS — E785 Hyperlipidemia, unspecified: Secondary | ICD-10-CM | POA: Insufficient documentation

## 2016-04-26 DIAGNOSIS — I509 Heart failure, unspecified: Secondary | ICD-10-CM | POA: Insufficient documentation

## 2016-04-26 DIAGNOSIS — R0609 Other forms of dyspnea: Secondary | ICD-10-CM | POA: Insufficient documentation

## 2016-04-26 DIAGNOSIS — I714 Abdominal aortic aneurysm, without rupture: Secondary | ICD-10-CM | POA: Insufficient documentation

## 2016-04-26 DIAGNOSIS — N184 Chronic kidney disease, stage 4 (severe): Secondary | ICD-10-CM | POA: Diagnosis not present

## 2016-04-26 DIAGNOSIS — I130 Hypertensive heart and chronic kidney disease with heart failure and stage 1 through stage 4 chronic kidney disease, or unspecified chronic kidney disease: Secondary | ICD-10-CM | POA: Insufficient documentation

## 2016-04-26 DIAGNOSIS — I4891 Unspecified atrial fibrillation: Secondary | ICD-10-CM | POA: Insufficient documentation

## 2016-04-26 DIAGNOSIS — R079 Chest pain, unspecified: Secondary | ICD-10-CM | POA: Diagnosis present

## 2016-04-26 DIAGNOSIS — J449 Chronic obstructive pulmonary disease, unspecified: Secondary | ICD-10-CM | POA: Insufficient documentation

## 2016-04-26 DIAGNOSIS — R0789 Other chest pain: Secondary | ICD-10-CM

## 2016-04-26 DIAGNOSIS — Z87891 Personal history of nicotine dependence: Secondary | ICD-10-CM | POA: Diagnosis not present

## 2016-04-26 LAB — MYOCARDIAL PERFUSION IMAGING
CHL CUP NUCLEAR SDS: 1
CHL CUP RESTING HR STRESS: 69 {beats}/min
LHR: 0.29
LV dias vol: 101 mL (ref 62–150)
LVSYSVOL: 46 mL
Peak HR: 78 {beats}/min
SRS: 4
SSS: 5
TID: 0.96

## 2016-04-26 MED ORDER — REGADENOSON 0.4 MG/5ML IV SOLN
0.4000 mg | Freq: Once | INTRAVENOUS | Status: AC
  Administered 2016-04-26: 0.4 mg via INTRAVENOUS

## 2016-04-26 MED ORDER — TECHNETIUM TC 99M TETROFOSMIN IV KIT
10.9000 | PACK | Freq: Once | INTRAVENOUS | Status: AC | PRN
  Administered 2016-04-26: 10.9 via INTRAVENOUS
  Filled 2016-04-26: qty 11

## 2016-04-26 MED ORDER — TECHNETIUM TC 99M TETROFOSMIN IV KIT
31.8000 | PACK | Freq: Once | INTRAVENOUS | Status: AC | PRN
Start: 2016-04-26 — End: 2016-04-26
  Administered 2016-04-26: 31.8 via INTRAVENOUS
  Filled 2016-04-26: qty 32

## 2016-04-27 ENCOUNTER — Other Ambulatory Visit: Payer: Self-pay | Admitting: Family Medicine

## 2016-04-27 DIAGNOSIS — K921 Melena: Secondary | ICD-10-CM

## 2016-04-30 ENCOUNTER — Encounter: Payer: Self-pay | Admitting: Family Medicine

## 2016-05-03 ENCOUNTER — Ambulatory Visit (INDEPENDENT_AMBULATORY_CARE_PROVIDER_SITE_OTHER): Payer: PPO | Admitting: Cardiovascular Disease

## 2016-05-03 ENCOUNTER — Encounter: Payer: Self-pay | Admitting: Cardiovascular Disease

## 2016-05-03 VITALS — BP 116/70 | HR 88 | Ht 67.0 in | Wt 147.4 lb

## 2016-05-03 DIAGNOSIS — E785 Hyperlipidemia, unspecified: Secondary | ICD-10-CM | POA: Diagnosis not present

## 2016-05-03 DIAGNOSIS — I481 Persistent atrial fibrillation: Secondary | ICD-10-CM | POA: Diagnosis not present

## 2016-05-03 DIAGNOSIS — R0600 Dyspnea, unspecified: Secondary | ICD-10-CM

## 2016-05-03 DIAGNOSIS — I5032 Chronic diastolic (congestive) heart failure: Secondary | ICD-10-CM | POA: Diagnosis not present

## 2016-05-03 DIAGNOSIS — I4819 Other persistent atrial fibrillation: Secondary | ICD-10-CM

## 2016-05-03 NOTE — Progress Notes (Signed)
Chief Complaint  Patient presents with  . Congestive Heart Failure  . Shortness of Breath   History of Present Illness: 70 yo male with history of paroxysmal atrial fibrillation, AAA, chronic diastolic CHF, GERD, HTN, HLD who is here today for cardiac follow up. He has has been followed by Dr. Patty SermonsBrackbill. He was admitted in November 2014 with COPD and atrial fib with RVR. Echo showed normal LV function. He has been managed on Xarelto which was stopped for some time due to recurrent epistaxis. He has had issues with diastolic CHF. DCCV was attempted several times in January 2015 while on amiodarone and he maintained sinus for the last 2 years. He has been managed on daily Lasix. He has chronic kidney disease followed by Dr. Allena KatzPatel. He did well until July 2017 when he was seen in primary care by Dr. Tanya NonesPickard with dyspnea. He was volume overloaded and his Lasix was increased to 40 mg twice daily. He was back in atrial fibrillation with RVR. Toprol was increased to 100 mg daily. He has been on Xarelto. He has anemia felt to be due to his chronic kidney disease. Of note, he has not tolerated amiodarone in the past due to worsening respiratory issues. I saw him 04/12/16 and he complained of worsened dyspnea. Echo 04/26/16 with normal LV function, no valve issues. Stress myoview 04/26/16 with no ischemia. He has not had a cardiac cath in the past. No documented CAD.   He is here today for follow up. His Lasix was reduced to 40 mg daily due to renal insufficiency. He is followed in Nephrology by Dr. Allena KatzPatel. Last creatinine 2.8. His LE edema is stable, mild during the day and resolves at night. Weight is stable. He has noticed to palpitations. No chest pain but still having dyspnea. He is not happy with the way he feels. He has been on Xarelto for one month. He has been told there is blood in his stool and he has been referred to GI.     Primary Care Physician: Leo GrosserPICKARD,WARREN TOM, MD   Past Medical History:    Diagnosis Date  . AAA (abdominal aortic aneurysm) (HCC)    a. 09/2013: Resection and grafting of abdominal aortic aneurysm with insertion of an aorto by common iliac graft using 18 x 9 mm Hemashield Dacron graft.  . Arthritis    "little in my hands" (09/08/2013)  . Chronic diastolic CHF (congestive heart failure) (HCC)    a.  Echo (08/13/2013): EF 55-60%, normal wall motion, mild MR, mild LAE  . Chronic kidney disease (CKD), stage III (moderate)   . COPD (chronic obstructive pulmonary disease) (HCC)   . 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 GERD (gastroesophageal reflux disease)   . H/O hiatal hernia   . Hx of cardiovascular stress test    a. Myoview 09/2012:  no scar or ischemia  . Hyperlipidemia   . Hypertension    Sees Dr. Lynnea FerrierWarren Pickard  . PAF (paroxysmal atrial fibrillation) (HCC)    a. amiodarone Rx started 08/2013;  b. s/p TEE-DCCV 09/2013 => recurrent AFib => repeat DCCV => NSR;   c. Xarelto 15 QD due to CKD  . Pneumonia   . Pulmonary hypertension associated with unclear multi-factorial mechanisms (HCC)    Only on echo (8/17).      Past Surgical History:  Procedure Laterality Date  . ABDOMINAL AORTIC ANEURYSM REPAIR  10/15/2012   Procedure: ANEURYSM ABDOMINAL AORTIC REPAIR;  Surgeon: Fayrene FearingJames  Marlynn Perking, MD;  Location: [facility] OR;  Service: Vascular;  Laterality: N/A;  Resection and Grafting of Abdominal Aortic Aneurysm - Aortobi-Iliac   . ABDOMINAL AORTIC ANEURYSM REPAIR  10-15-2012  . CARDIOVERSION N/A 09/22/2013   Procedure: CARDIOVERSION;  Surgeon: Lars Masson, MD;  Location: [facility] ENDOSCOPY;  Service: Cardiovascular;  Laterality: N/A;  . CARDIOVERSION  Jan. 12, 2015   Dr. Doylene Canning. Ladona Ridgel  . CARDIOVERSION N/A 09/28/2013   Procedure: CARDIOVERSION;  Surgeon: Marinus Maw, MD;  Location: [facility] CATH LAB;  Service: Cardiovascular;  Laterality: N/A;  . INGUINAL HERNIA REPAIR Right 12/22/2014   Procedure: OPEN RIGHT INGUINAL HERNIA REPAIR WITH MESH;  Surgeon: Axel Filler, MD;  Location: [facility] ORS;  Service: General;  Laterality: Right;  . INSERTION OF MESH N/A 12/22/2014   Procedure: INSERTION OF MESH;  Surgeon: Axel Filler, MD;  Location: [facility] ORS;  Service: General;  Laterality: N/A;  . KNEE ARTHROSCOPY Right   . TEE WITHOUT CARDIOVERSION N/A 09/22/2013   Procedure: TRANSESOPHAGEAL ECHOCARDIOGRAM (TEE);  Surgeon: Lars Masson, MD;  Location: [facility] ENDOSCOPY;  Service: Cardiovascular;  Laterality: N/A;    Current Outpatient Prescriptions  Medication Sig Dispense Refill  . atorvastatin (LIPITOR) 10 MG tablet Take 10 mg by mouth daily.    Marland Kitchen diltiazem (TAZTIA XT) 180 MG 24 hr capsule Take 2 capsules (360 mg total) by mouth daily. 60 capsule 5  . doxazosin (CARDURA) 4 MG tablet Take 1 tablet (4 mg total) by mouth daily. Stop tamsulosin 30 tablet 2  . furosemide (LASIX) 40 MG tablet TAKE 1 TABLET (40 MG TOTAL) BY MOUTH DAILY. 30 tablet 5  . metoprolol succinate (TOPROL-XL) 100 MG 24 hr tablet Take 1 tablet (100 mg total) by mouth daily. Take with or immediately following a meal. 90 tablet 3  . pantoprazole (PROTONIX) 40 MG tablet TAKE 1 TABLET (40 MG TOTAL) BY MOUTH DAILY. 30 tablet 3  . Rivaroxaban (XARELTO) 15 MG TABS tablet Take 1 tablet (15 mg total) by mouth daily with supper. 30 tablet 11  . SYMBICORT 160-4.5 MCG/ACT inhaler INHALE 2 PUFFS INTO THE LUNGS 2 (TWO) TIMES DAILY. 10.2 Inhaler 11  . tamsulosin (FLOMAX) 0.4 MG CAPS capsule TAKE ONE CAPSULE BY MOUTH EVERY DAY 90 capsule 3  . VENTOLIN HFA 108 (90 Base) MCG/ACT inhaler INHALE 2 PUFFS INTO THE LUNGS EVERY 6 (SIX) HOURS AS NEEDED FOR WHEEZING OR SHORTNESS OF BREATH. 18 g 5   No current facility-administered medications for this visit.     Allergies  Allergen Reactions  . Niaspan [Niacin Er] Anaphylaxis    Social History   Social History  . Marital status: Married    Spouse name: Nicole Cella  . Number of children: 4  . Years of education: N/A   Occupational History  . Works PT as a  Education administrator    Social History Main Topics  . Smoking status: Former Smoker    Packs/day: 1.00    Years: 56.00    Types: Cigarettes    Quit date: 07/31/2013  . Smokeless tobacco: Former Neurosurgeon    Types: Chew  . Alcohol use No  . Drug use: No  . Sexual activity: Not Currently   Other Topics Concern  . Not on file   Social History Narrative   Married.  Lives with wife.  Ambulates without assistance.    Family History  Problem Relation Age of Onset  . Diabetes Mother   . Tuberculosis Mother   . Heart disease Father   .  Heart attack Father   . Heart disease Son     Blood clot:  Arm    Review of Systems:  As stated in the HPI and otherwise negative.   BP 116/70   Pulse 88   Ht 5\' 7"  (1.702 m)   Wt 147 lb 6.4 oz (66.9 kg)   SpO2 90%   BMI 23.09 kg/m   Physical Examination: General: Well developed, well nourished, NAD  HEENT: OP clear, mucus membranes moist  SKIN: warm, dry. No rashes. Neuro: No focal deficits  Musculoskeletal: Muscle strength 5/5 all ext  Psychiatric: Mood and affect normal  Neck: No JVD, no carotid bruits, no thyromegaly, no lymphadenopathy.  Lungs:Clear bilaterally, no wheezes, rhonci, crackles Cardiovascular: Irreg irreg with no murmurs, gallops or rubs. Abdomen:Soft. Bowel sounds present. Non-tender.  Extremities: Trace bilateral lower extremity edema. Pulses are 2 + in the bilateral DP/PT.  Echo 04/26/16: - Left ventricle: The cavity size was normal. Wall thickness was   normal. - Ventricular septum: The contour showed diastolic flattening and   systolic flattening. These changes are consistent with RV volume   and pressure overload. - Mitral valve: There was mild regurgitation. - Left atrium: The atrium was mildly dilated. - Right ventricle: The cavity size was moderately dilated. Systolic   function was mildly reduced. - Right atrium: The atrium was moderately dilated. - Atrial septum: No defect or patent foramen ovale was identified. -  Tricuspid valve: There was moderate regurgitation. - Pulmonary arteries: Systolic pressure was moderately to severely   increased. PA peak pressure: 59 mm Hg (S).  EKG:  EKG is not ordered today. The ekg ordered today demonstrates   Recent Labs: 10/20/2015: Platelets 183 03/30/2016: Hemoglobin, fingerstick 10.1 04/20/2016: ALT 12; BUN 43; Creat 2.87; Potassium 4.7; Sodium 139   Lipid Panel    Component Value Date/Time   CHOL 123 (L) 04/20/2016 0908   TRIG 113 04/20/2016 0908   HDL 44 04/20/2016 0908   CHOLHDL 2.8 04/20/2016 0908   VLDL 23 04/20/2016 0908   LDLCALC 56 04/20/2016 0908     Wt Readings from Last 3 Encounters:  05/03/16 147 lb 6.4 oz (66.9 kg)  04/20/16 146 lb (66.2 kg)  04/11/16 145 lb 6.4 oz (66 kg)     Other studies Reviewed: Additional studies/ records that were reviewed today include: . Review of the above records demonstrates:   Assessment and Plan:   1. Persistent atrial fibrillation: Rate is controlled on Toprol and Diltiazem. He has not tolerated amiodarone in the past. I think it reasonable to assume that some of his dyspnea is from the atrial fibrillation but he is rate controlled now. I have discussed attempt at cardioversion but I cannot plan this until there is clarity in regards to his anti-coagulation after GI workup for heme positive stools. (He would need to stay on uninterrupted Xarelto after cardioversion for one month). Stress test is normal but cannot fully exclude obstructive CAD given his high probability with 50 years of tobacco abuse (100 pack years). Cardiac cath is not optimal given his stage 3 chronic kidney disease. Echo is overall normal. For now, would continue rate control with Toprol and Diltiazem. Will continue Xarelto. If GI workup is ok, will consider cardioversion. Consider cardiac cath but I would have to discuss this with Nephrology.   2. Chronic diastolic CHF: Volume status seems to be stable on Lasix. Cannot push dose higher given  his renal failure.    3. HLD: Continue statin. Lipids followed in  primary care. LDL is well controlled.   4. Dyspnea: His dyspnea is likely multifactorial. Cannot fully exclude ischemic heart disease even though his stress test was ok. He smoked 100 pack years so CAD is likely. I also think it is likely that he has underlying COPD given his many years of tobacco abuse. He needs to get back into the Pulmonary office.   I will try to communicate with Dr. Tanya Nones and Dr. Sherene Sires as I think treating his many issues is going to take input from everyone. Will route this note to Dr. Allena Katz in Nephrology as well.   Current medicines are reviewed at length with the patient today.  The patient does not have concerns regarding medicines.  The following changes have been made:  no change  Labs/ tests ordered today include:  No orders of the defined types were placed in this encounter.    Disposition:   FU with me in 3 weeks   Signed, Verne Carrow, MD 05/03/2016 5:50 PM    [facility] [address] [place], [place], [place]  [address] Phone: [phone]; Fax: [phone]

## 2016-05-03 NOTE — Patient Instructions (Signed)
Medication Instructions:  Your physician recommends that you continue on your current medications as directed. Please refer to the Current Medication list given to you today.   Labwork: NONE  Testing/Procedures: NONE  Follow-Up: 05/30/16 @ 10:15 WITH DR. Clifton JamesMCALHANY  Any Other Special Instructions Will Be Listed Below (If Applicable).     If you need a refill on your cardiac medications before your next appointment, please call your pharmacy.

## 2016-05-08 ENCOUNTER — Ambulatory Visit (INDEPENDENT_AMBULATORY_CARE_PROVIDER_SITE_OTHER): Payer: PPO | Admitting: Internal Medicine

## 2016-05-08 ENCOUNTER — Other Ambulatory Visit (INDEPENDENT_AMBULATORY_CARE_PROVIDER_SITE_OTHER): Payer: PPO

## 2016-05-08 ENCOUNTER — Encounter: Payer: Self-pay | Admitting: Internal Medicine

## 2016-05-08 ENCOUNTER — Ambulatory Visit (INDEPENDENT_AMBULATORY_CARE_PROVIDER_SITE_OTHER)
Admission: RE | Admit: 2016-05-08 | Discharge: 2016-05-08 | Disposition: A | Discharge status: Home / Self Care | Payer: PPO | Source: Ambulatory Visit | Attending: Internal Medicine | Admitting: Internal Medicine

## 2016-05-08 VITALS — BP 120/74 | HR 91 | Ht 67.0 in | Wt 147.0 lb

## 2016-05-08 DIAGNOSIS — R0600 Dyspnea, unspecified: Secondary | ICD-10-CM

## 2016-05-08 DIAGNOSIS — J90 Pleural effusion, not elsewhere classified: Secondary | ICD-10-CM | POA: Diagnosis not present

## 2016-05-08 DIAGNOSIS — J449 Chronic obstructive pulmonary disease, unspecified: Secondary | ICD-10-CM

## 2016-05-08 LAB — CBC WITH DIFFERENTIAL/PLATELET
BASOS ABS: 0 10*3/uL (ref 0.0–0.1)
Basophils Relative: 0.4 % (ref 0.0–3.0)
Eosinophils Absolute: 0.1 10*3/uL (ref 0.0–0.7)
Eosinophils Relative: 1.8 % (ref 0.0–5.0)
HCT: 39.1 % (ref 39.0–52.0)
Hemoglobin: 12.8 g/dL — ABNORMAL LOW (ref 13.0–17.0)
LYMPHS ABS: 1.6 10*3/uL (ref 0.7–4.0)
Lymphocytes Relative: 22 % (ref 12.0–46.0)
MCHC: 32.8 g/dL (ref 30.0–36.0)
MCV: 91.1 fl (ref 78.0–100.0)
MONO ABS: 0.6 10*3/uL (ref 0.1–1.0)
Monocytes Relative: 7.7 % (ref 3.0–12.0)
NEUTROS PCT: 68.1 % (ref 43.0–77.0)
Neutro Abs: 5 10*3/uL (ref 1.4–7.7)
PLATELETS: 194 10*3/uL (ref 150.0–400.0)
RBC: 4.3 Mil/uL (ref 4.22–5.81)
RDW: 15.5 % (ref 11.5–15.5)
WBC: 7.4 10*3/uL (ref 4.0–10.5)

## 2016-05-08 LAB — BASIC METABOLIC PANEL
BUN: 44 mg/dL — AB (ref 6–23)
CALCIUM: 9.1 mg/dL (ref 8.4–10.5)
CO2: 22 mEq/L (ref 19–32)
CREATININE: 2.72 mg/dL — AB (ref 0.40–1.50)
Chloride: 104 mEq/L (ref 96–112)
GFR: 24.62 mL/min — AB (ref >60.00)
Glucose, Bld: 115 mg/dL — ABNORMAL HIGH (ref 70–99)
Potassium: 5.6 mEq/L — ABNORMAL HIGH (ref 3.5–5.1)
Sodium: 137 mEq/L (ref 135–145)

## 2016-05-08 LAB — TSH: TSH: 0.45 u[IU]/mL (ref 0.35–4.50)

## 2016-05-08 LAB — SEDIMENTATION RATE: SED RATE: 28 mm/h — AB (ref 0–20)

## 2016-05-08 LAB — BRAIN NATRIURETIC PEPTIDE: PRO B NATRI PEPTIDE: 1079 pg/mL — AB (ref 0.0–100.0)

## 2016-05-08 MED ORDER — GLYCOPYRROLATE-FORMOTEROL 9-4.8 MCG/ACT IN AERO: 2.0000 | INHALATION_SPRAY | Freq: Two times a day (BID) | RESPIRATORY_TRACT | 0 refills | Status: DC

## 2016-05-08 NOTE — Patient Instructions (Addendum)
Plan A = Automatic =  BEVESPI  Take 2 puffs first thing in am and then another 2 puffs about 12 hours later.    Work on inhaler technique:  relax and gently blow all the way out then take a nice smooth deep breath back in, triggering the inhaler at same time you start breathing in.  Hold for up to 5 seconds if you can. Blow out thru nose. Rinse and gargle with water when done     Plan B = Backup Only use your albuterol (ventolin)  as a rescue medication to be used if you can't catch your breath by resting or doing a relaxed purse lip breathing pattern.  - The less you use it, the better it will work when you need it. - Ok to use the inhaler up to 2 puffs  every 4 hours if you must but call for appointment if use goes up over your usual need - Don't leave home without it !!  (think of it like the spare tire for your car)    Please remember to go to the lab and x-ray department downstairs for your tests - we will call you with the results when they are available.  Please schedule a follow up office visit in 4 weeks, sooner if needed

## 2016-05-08 NOTE — Progress Notes (Signed)
Subjective:    Patient ID: Mark [name], male    DOB: 1945-12-16,   MRN: [id number]    Brief patient profile:  89 yowm quit smoking 12/2012 with doe then that has variably  worsened since  so referred by Dr Mark [name] to pulmonary clinic 01/21/15  History of Present Illness  01/21/2015 1st [facility] office visit/ Mark [name]  On amiodarone/ symb/spiriva with min airflow obst on pfts 01/21/15  Chief Complaint  Patient presents with  . Pulmonary Consult    Referred by Dr. Lynnea FerrierWarren [name]. Pt states he was dxed with COPD. He c/o SOB and cough for the past yr.  He gets SOB walking 200 ft on a flat suface. Cough is non prod most of the time.   onset of doe and dry cough indolent / progressive while on amiodarone for afib/  just started 02 at hs  rec Stop amiodarone today and spiriva for now  Continue symbicort Take 2 puffs first thing in am and then another 2 puffs about 12 hours later Only use your albuterol (ventolin)  as a rescue medication  pred 10 mg 2 daily with bast until improving and then 1 daily until return    02/22/2015 f/u ov/Mark [name] re: ? amio ild/ gold III copd with reversibility  Chief Complaint  Patient presents with  . Follow-up    PFT done today. Pt states breathing is doing better. He states that he can walk twice as far without having to stop. Using albuterol 1 x per wk on average.    rec Stop metaprolol and take bisoprol 5 mg one daily  Work on inhaler technique:   Stay on symbicort 160  Take 2 puffs first thing in am and then another 2 puffs about 12 hours later.  Prednisone 10 mg one per day x one week then half a pill x one week and off    05/08/2016 acute extended  f/u ov/Mark [name] re: GOLD III  Copd/ chf/ cri maint rx = symb 160 2bid Chief Complaint  Patient presents with  . Acute Visit    Pt c/o increased SOB for the past 2-3 months. He gets out of breath walking back from the mailbox and breathing is esp worse first thing in the am. He coughed up min amount of blood  this am. He has been using albuterol inhaler 2 x daily on average.   cough is not unusual pattern from baseline  New = sob Only with activity / not noct   Get to mb ok but stops half way up slt incline / not sure saba works as uses with am symbicort       No obvious day to day or daytime variabilty or assoc excess/ purulent sputum or mucus plugs  or cp or chest tightness, subjective wheeze overt sinus or hb symptoms. No unusual exp hx or h/o childhood pna/ asthma or knowledge of premature birth.  Sleeping ok without nocturnal  or early am exacerbation  of respiratory  c/o's or need for noct saba. Also denies any obvious fluctuation of symptoms with weather or environmental changes or other aggravating or alleviating factors except as outlined above   Current Medications, Allergies, Complete Past Medical History, Past Surgical History, Family History, and Social History were reviewed in [facility] electronic medical record.  ROS  The following are not active complaints unless bolded sore throat, dysphagia, dental problems, itching, sneezing,  nasal congestion or excess/ purulent secretions, ear ache,   fever, chills, sweats, unintended wt  loss, pleuritic or exertional cp, hemoptysis,  orthopnea pnd or leg swelling, presyncope, palpitations, heartburn, abdominal pain, anorexia, nausea, vomiting, diarrhea  or change in bowel or urinary habits, change in stools or urine, dysuria,hematuria,  rash, arthralgias, visual complaints, headache, numbness weakness or ataxia or problems with walking or coordination,  change in mood/affect or memory.            Objective:  Physical Exam  amb wm nad   05/08/2016        147 02/22/15              152    01/21/15 144 lb 12.8 oz (65.681 kg)  01/07/15 146 lb (66.225 kg)  01/04/15 146 lb (66.225 kg)    Vital signs reviewed  HEENT: nl dentition, turbinates, and orophanx. Nl external ear canals without cough reflex   NECK :  without JVD/Nodes/TM/ nl  carotid upstrokes bilaterally   LUNGS: no acc muscle use, subtle insp crackles both bases without cough on insp or exp maneuvers and dullness in bases L > R    CV:  IRIR  no s3 or murmur or increase in P2,  Trace to 1+ bilateral Lower ext  edema   ABD:  soft and nontender with nl excursion in the supine position. No bruits or organomegaly, bowel sounds nl  MS:  warm without deformities, calf tenderness, cyanosis or clubbing  SKIN: warm and dry without lesions    NEURO:  alert, approp, no deficits        CXR PA and Lateral:   05/08/2016 :    I personally reviewed images and agree with radiology impression as follows:    Moderate bilateral pleural effusions with bibasilar atelectasis and diffuse interstitial prominence, possibly interstitial edema.   Labs ordered/ reviewed:      Chemistry      Component Value Date/Time   NA 137 05/08/2016 1212   NA 135 09/13/2014 1553   K 5.6 (H) 05/08/2016 1212   CL 104 05/08/2016 1212   CO2 22 05/08/2016 1212   BUN 44 (H) 05/08/2016 1212   BUN 45 (H) 09/13/2014 1553   CREATININE 2.72 (H) 05/08/2016 1212   CREATININE 2.87 (H) 04/20/2016 0908      Component Value Date/Time   CALCIUM 9.1 05/08/2016 1212   ALKPHOS 77 04/20/2016 0908   AST 9 (L) 04/20/2016 0908   ALT 12 04/20/2016 0908   BILITOT 0.6 04/20/2016 0908        Lab Results  Component Value Date   WBC 7.4 05/08/2016   HGB 12.8 (L) 05/08/2016   HCT 39.1 05/08/2016   MCV 91.1 05/08/2016   PLT 194.0 05/08/2016       Lab Results  Component Value Date   TSH 0.45 05/08/2016     Lab Results  Component Value Date   PROBNP 1,079.0 (H) 05/08/2016       Lab Results  Component Value Date   ESRSEDRATE 28 (H) 05/08/2016   ESRSEDRATE 52 (H) 01/21/2015                 Assessment & Plan:

## 2016-05-09 ENCOUNTER — Encounter: Payer: Self-pay | Admitting: Internal Medicine

## 2016-05-09 DIAGNOSIS — J90 Pleural effusion, not elsewhere classified: Secondary | ICD-10-CM | POA: Insufficient documentation

## 2016-05-09 NOTE — Assessment & Plan Note (Addendum)
-   spirometry 01/21/2015  Minimal airflow obst > d/c spiriva/ continue symbicort    - PFTs 02/22/2015  FEV1 1.45  (51%) ratio 59 after 23% resp to saba p am symbicort and dlco 40% and corrects to 57 and symptomatically better off spiriva and on prednisone 10 mg daily for ? amio lung  > weaned off    - 05/08/2016  After extensive coaching HFA effectiveness =    75% > changed symbicort to bevespi x 4 week sample trial   It appears that his chf/ cri/ vol overload with bilateral effusions are all playing more of a role here than copd  However, Pt is Group B in terms of symptom/risk and laba/lama therefore appropriate rx at this point. > try bevespi  I had an extended discussion with the patient reviewing all relevant studies completed to date and  lasting 25  minutes of a 40 minute visit  acute office visit directed at a new problem that has developed subsequent to his last evaluation over a year ago.  Each maintenance medication was reviewed in detail including most importantly the difference between maintenance and prns and under what circumstances the prns are to be triggered using an action plan format that is not reflected in the computer generated alphabetically organized AVS.    Please see instructions for details which were reviewed in writing and the patient given a copy highlighting the part that I personally wrote and discussed at today's ov.

## 2016-05-09 NOTE — Assessment & Plan Note (Addendum)
New as of 03/30/16  and most likely related to CHF and chronic renal insufficiency with volume overload although the left greater than right distribution is a bit unusual.  Note that pleural effusions and COPD have the exact same effect on inspiratory muscles, placing them in a mechanical disadvantage because it shortens  them at the onset of inspiration.  The shorter muscles are weaker muscle exactly like the starling curve predicts for myocardial muscle  Function.  Since he is already  being managed by both cardiology and renal medicine I would only add that the larger effusion really is not large enough to tap on the left yet but if it became refractory to medical management we certainly could still consider this.  Discussed in detail all the  indications, usual  risks and alternatives  relative to the benefits with patient who agrees to proceed with conservative f/u as outlined

## 2016-05-09 NOTE — Assessment & Plan Note (Signed)
Note now has bilateral effusions likely related to chf/cri - see pleural effusion a/p

## 2016-05-30 ENCOUNTER — Encounter: Payer: Self-pay | Admitting: *Deleted

## 2016-05-30 ENCOUNTER — Encounter: Payer: Self-pay | Admitting: Cardiovascular Disease

## 2016-05-30 ENCOUNTER — Ambulatory Visit (INDEPENDENT_AMBULATORY_CARE_PROVIDER_SITE_OTHER): Payer: PPO | Admitting: Cardiovascular Disease

## 2016-05-30 ENCOUNTER — Other Ambulatory Visit: Payer: Self-pay | Admitting: Family Medicine

## 2016-05-30 VITALS — BP 138/80 | HR 60 | Ht 67.0 in | Wt 144.1 lb

## 2016-05-30 DIAGNOSIS — I5033 Acute on chronic diastolic (congestive) heart failure: Secondary | ICD-10-CM | POA: Diagnosis not present

## 2016-05-30 DIAGNOSIS — E785 Hyperlipidemia, unspecified: Secondary | ICD-10-CM | POA: Diagnosis not present

## 2016-05-30 DIAGNOSIS — I4891 Unspecified atrial fibrillation: Secondary | ICD-10-CM | POA: Diagnosis not present

## 2016-05-30 DIAGNOSIS — R0600 Dyspnea, unspecified: Secondary | ICD-10-CM | POA: Diagnosis not present

## 2016-05-30 LAB — CBC
HEMATOCRIT: 37.2 % — AB (ref 38.5–50.0)
HEMOGLOBIN: 12.4 g/dL — AB (ref 13.2–17.1)
MCH: 29.6 pg (ref 27.0–33.0)
MCHC: 33.3 g/dL (ref 32.0–36.0)
MCV: 88.8 fL (ref 80.0–100.0)
MPV: 8.7 fL (ref 7.5–12.5)
Platelets: 167 10*3/uL (ref 140–400)
RBC: 4.19 MIL/uL — AB (ref 4.20–5.80)
RDW: 14.3 % (ref 11.0–15.0)
WBC: 5.9 10*3/uL (ref 3.8–10.8)

## 2016-05-30 LAB — BASIC METABOLIC PANEL
BUN: 33 mg/dL — ABNORMAL HIGH (ref 7–25)
CALCIUM: 9.3 mg/dL (ref 8.6–10.3)
CO2: 22 mmol/L (ref 20–31)
Chloride: 105 mmol/L (ref 98–110)
Creat: 2.59 mg/dL — ABNORMAL HIGH (ref 0.70–1.18)
GLUCOSE: 96 mg/dL (ref 65–99)
POTASSIUM: 4.9 mmol/L (ref 3.5–5.3)
SODIUM: 138 mmol/L (ref 135–146)

## 2016-05-30 MED ORDER — DILTIAZEM HCL ER BEADS 180 MG PO CP24: 360.0000 mg | ORAL_CAPSULE | Freq: Every day | ORAL | 3 refills | Status: DC

## 2016-05-30 MED ORDER — DOXAZOSIN MESYLATE 4 MG PO TABS: 4.0000 mg | ORAL_TABLET | Freq: Every day | ORAL | 3 refills | Status: DC

## 2016-05-30 NOTE — Patient Instructions (Addendum)
Medication Instructions:  Your physician recommends that you continue on your current medications as directed. Please refer to the Current Medication list given to you today.   Labwork: Lab work to be done today--BMP, CBC  Testing/Procedures: Your physician has recommended that you have a Cardioversion (DCCV). Electrical Cardioversion uses a jolt of electricity to your heart either through paddles or wired patches attached to your chest. This is a controlled, usually prescheduled, procedure. Defibrillation is done under light anesthesia in the hospital, and you usually go home the day of the procedure. This is done to get your heart back into a normal rhythm. You are not awake for the procedure. Please see the instruction sheet given to you today.  Scheduled for September 18,2017  Follow-Up: Your physician recommends that you schedule a follow-up appointment in: 4-6 weeks with Dr. Clifton JamesMcAlhany. Scheduled for October 12,2017 at 10:45.     Any Other Special Instructions Will Be Listed Below (If Applicable).     If you need a refill on your cardiac medications before your next appointment, please call your pharmacy.

## 2016-05-30 NOTE — Progress Notes (Signed)
Chief Complaint  Patient presents with  . Congestive Heart Failure   History of Present Illness: 70 yo male with history of paroxysmal atrial fibrillation, AAA, chronic diastolic CHF, GERD, HTN, HLD, chronic kidney disease and COPD who is here today for cardiac follow up. He has has been followed by Dr. Patty SermonsBrackbill. He was admitted in November 2014 with COPD and atrial fib with RVR. Echo showed normal LV function. He has been managed on Xarelto which was stopped for some time due to recurrent epistaxis. He has had issues with diastolic CHF. DCCV was attempted several times in January 2015 while on amiodarone and he maintained sinus for 2 years after that. Amiodarone stopped due to lung issues. He has been managed on daily Lasix. He has chronic kidney disease followed by Dr. Allena KatzPatel. He did well until July 2017 when he was seen in primary care by Dr. Tanya NonesPickard with dyspnea. He was volume overloaded and his Lasix was increased to 40 mg twice daily. He was back in atrial fibrillation with RVR. Toprol was increased to 100 mg daily. He has been on Xarelto. He has anemia felt to be due to his chronic kidney disease. Of note, he has not tolerated amiodarone in the past due to worsening respiratory issues. I saw him 04/12/16 and he complained of worsened dyspnea. Echo 04/26/16 with normal LV function, no valve issues. Stress myoview 04/26/16 with no ischemia. He has not had a cardiac cath in the past. No documented CAD. At his last visit here 05/03/16 we discussed having GI workup to discuss heme positive stool before proceeding with cardioversion. But he has not been able to get into the GI clinic. He has remained on Xarelto (reduced dose due to his renal insufficiency) with no evidence of GI bleeding. His stools are brown. He was seen in the Pulmonary office 05/08/16 and COPD felt to be stable and not contributing to his dyspnea. Chest x-ray 05/08/16 with moderate bilateral pleural effusions with mild interstitial edema.  Dr. Sherene SiresWert suggested consideration for thoracentesis if effusions worsen or do not resolve with diuresis. He has only been taking Lasix 40 mg daily due to renal insufficiency.  He is followed in Nephrology by Dr. Allena KatzPatel. Last creatinine 2.7.   He is here today for follow up. Weight is stable. His LE edema is stable, mild during the day and resolves at night. He has noticed to palpitations. No chest pain but still having dyspnea. This has improved some after starting Mucinex.   Primary Care Physician: Leo GrosserPICKARD,WARREN TOM, MD   Past Medical History:  Diagnosis Date  . AAA (abdominal aortic aneurysm) (HCC)    a. 09/2013: Resection and grafting of abdominal aortic aneurysm with insertion of an aorto by common iliac graft using 18 x 9 mm Hemashield Dacron graft.  . Arthritis    "little in my hands" (09/08/2013)  . Chronic diastolic CHF (congestive heart failure) (HCC)    a.  Echo (08/13/2013): EF 55-60%, normal wall motion, mild MR, mild LAE  . Chronic kidney disease (CKD), stage III (moderate)   . COPD (chronic obstructive pulmonary disease) (HCC)   . 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 GERD (gastroesophageal reflux disease)   . H/O hiatal hernia   . Hx of cardiovascular stress test    a. Myoview 09/2012:  no scar or ischemia  . Hyperlipidemia   . Hypertension    Sees Dr. Lynnea FerrierWarren Pickard  . PAF (paroxysmal atrial fibrillation) (HCC)  a. amiodarone Rx started 08/2013;  b. s/p TEE-DCCV 09/2013 => recurrent AFib => repeat DCCV => NSR;   c. Xarelto 15 QD due to CKD  . Pneumonia   . Pulmonary hypertension associated with unclear multi-factorial mechanisms (HCC)    Only on echo (8/17).      Past Surgical History:  Procedure Laterality Date  . ABDOMINAL AORTIC ANEURYSM REPAIR  10/15/2012   Procedure: ANEURYSM ABDOMINAL AORTIC REPAIR;  Surgeon: Pryor Ochoa, MD;  Location: [facility] OR;  Service: Vascular;  Laterality: N/A;  Resection and Grafting of Abdominal Aortic Aneurysm -  Aortobi-Iliac   . ABDOMINAL AORTIC ANEURYSM REPAIR  10-15-2012  . CARDIOVERSION N/A 09/22/2013   Procedure: CARDIOVERSION;  Surgeon: Lars Masson, MD;  Location: [facility] ENDOSCOPY;  Service: Cardiovascular;  Laterality: N/A;  . CARDIOVERSION  Jan. 12, 2015   Dr. Doylene Canning. Ladona Ridgel  . CARDIOVERSION N/A 09/28/2013   Procedure: CARDIOVERSION;  Surgeon: Marinus Maw, MD;  Location: [facility] CATH LAB;  Service: Cardiovascular;  Laterality: N/A;  . INGUINAL HERNIA REPAIR Right 12/22/2014   Procedure: OPEN RIGHT INGUINAL HERNIA REPAIR WITH MESH;  Surgeon: Axel Filler, MD;  Location: [facility] ORS;  Service: General;  Laterality: Right;  . INSERTION OF MESH N/A 12/22/2014   Procedure: INSERTION OF MESH;  Surgeon: Axel Filler, MD;  Location: [facility] ORS;  Service: General;  Laterality: N/A;  . KNEE ARTHROSCOPY Right   . TEE WITHOUT CARDIOVERSION N/A 09/22/2013   Procedure: TRANSESOPHAGEAL ECHOCARDIOGRAM (TEE);  Surgeon: Lars Masson, MD;  Location: [facility] ENDOSCOPY;  Service: Cardiovascular;  Laterality: N/A;    Current Outpatient Prescriptions  Medication Sig Dispense Refill  . atorvastatin (LIPITOR) 10 MG tablet Take 10 mg by mouth daily.    . furosemide (LASIX) 40 MG tablet TAKE 1 TABLET (40 MG TOTAL) BY MOUTH DAILY. 30 tablet 5  . Glycopyrrolate-Formoterol (BEVESPI AEROSPHERE) 9-4.8 MCG/ACT AERO Inhale 2 puffs into the lungs 2 (two) times daily. 1 Inhaler 0  . metoprolol succinate (TOPROL-XL) 100 MG 24 hr tablet Take 1 tablet (100 mg total) by mouth daily. Take with or immediately following a meal. 90 tablet 3  . pantoprazole (PROTONIX) 40 MG tablet TAKE 1 TABLET (40 MG TOTAL) BY MOUTH DAILY. 30 tablet 3  . Rivaroxaban (XARELTO) 15 MG TABS tablet Take 1 tablet (15 mg total) by mouth daily with supper. 30 tablet 11  . tamsulosin (FLOMAX) 0.4 MG CAPS capsule TAKE ONE CAPSULE BY MOUTH EVERY DAY 90 capsule 3  . VENTOLIN HFA 108 (90 Base) MCG/ACT inhaler INHALE 2 PUFFS INTO THE LUNGS EVERY 6 (SIX) HOURS AS NEEDED FOR  WHEEZING OR SHORTNESS OF BREATH. 18 g 5  . diltiazem (TAZTIA XT) 180 MG 24 hr capsule Take 2 capsules (360 mg total) by mouth daily. 180 capsule 3  . doxazosin (CARDURA) 4 MG tablet Take 1 tablet (4 mg total) by mouth daily. 90 tablet 3   No current facility-administered medications for this visit.     Allergies  Allergen Reactions  . Niaspan [Niacin Er] Anaphylaxis    Social History   Social History  . Marital status: Married    Spouse name: Nicole Cella  . Number of children: 4  . Years of education: N/A   Occupational History  . Works PT as a Education administrator    Social History Main Topics  . Smoking status: Former Smoker    Packs/day: 1.00    Years: 56.00    Types: Cigarettes    Quit date: 07/31/2013  . Smokeless tobacco:  Former Neurosurgeon    Types: Chew  . Alcohol use No  . Drug use: No  . Sexual activity: Not Currently   Other Topics Concern  . Not on file   Social History Narrative   Married.  Lives with wife.  Ambulates without assistance.    Family History  Problem Relation Age of Onset  . Diabetes Mother   . Tuberculosis Mother   . Heart disease Father   . Heart attack Father   . Heart disease Son     Blood clot:  Arm    Review of Systems:  As stated in the HPI and otherwise negative.   BP 138/80   Pulse 60   Ht 5\' 7"  (1.702 m)   Wt 144 lb 1.9 oz (65.4 kg)   BMI 22.57 kg/m   Physical Examination: General: Well developed, well nourished, NAD  HEENT: OP clear, mucus membranes moist  SKIN: warm, dry. No rashes. Neuro: No focal deficits  Musculoskeletal: Muscle strength 5/5 all ext  Psychiatric: Mood and affect normal  Neck: No JVD, no carotid bruits, no thyromegaly, no lymphadenopathy.  Lungs:Clear bilaterally, no wheezes, rhonci, crackles Cardiovascular: Irreg irreg with no murmurs, gallops or rubs. Abdomen:Soft. Bowel sounds present. Non-tender.  Extremities: Trace bilateral lower extremity edema. Pulses are 2 + in the bilateral DP/PT.  Echo 04/26/16: -  Left ventricle: The cavity size was normal. Wall thickness was   normal. - Ventricular septum: The contour showed diastolic flattening and   systolic flattening. These changes are consistent with RV volume   and pressure overload. - Mitral valve: There was mild regurgitation. - Left atrium: The atrium was mildly dilated. - Right ventricle: The cavity size was moderately dilated. Systolic   function was mildly reduced. - Right atrium: The atrium was moderately dilated. - Atrial septum: No defect or patent foramen ovale was identified. - Tricuspid valve: There was moderate regurgitation. - Pulmonary arteries: Systolic pressure was moderately to severely   increased. PA peak pressure: 59 mm Hg (S).  Chest x-ray 05/08/16: FINDINGS: There is hyperinflation of the lungs compatible with COPD. Moderate bilateral pleural effusions, left slightly greater than right. Bibasilar atelectasis. Heart is borderline in size. Diffuse interstitial prominence, increasing since prior study. This may reflect interstitial edema.  IMPRESSION: Moderate bilateral pleural effusions with bibasilar atelectasis and diffuse interstitial prominence, possibly interstitial edema.  Nuclear stress test 04/26/16:  Nuclear stress EF: 54%.  There was no ST segment deviation noted during stress.  This is a low risk study.  The left ventricular ejection fraction is mildly decreased (45-54%).  No ischemia was identified.  EKG:  EKG is ordered today. The ekg ordered today demonstrates atrial flutter with variable block. 64 bpm.   Recent Labs: 04/20/2016: ALT 12 05/08/2016: BUN 44; Creatinine, Ser 2.72; Hemoglobin 12.8; Platelets 194.0; Potassium 5.6; Pro B Natriuretic peptide (BNP) 1,079.0; Sodium 137; TSH 0.45   Lipid Panel    Component Value Date/Time   CHOL 123 (L) 04/20/2016 0908   TRIG 113 04/20/2016 0908   HDL 44 04/20/2016 0908   CHOLHDL 2.8 04/20/2016 0908   VLDL 23 04/20/2016 0908   LDLCALC 56  04/20/2016 0908     Wt Readings from Last 3 Encounters:  05/30/16 144 lb 1.9 oz (65.4 kg)  05/08/16 147 lb (66.7 kg)  05/03/16 147 lb 6.4 oz (66.9 kg)     Other studies Reviewed: Additional studies/ records that were reviewed today include: . Review of the above records demonstrates:   Assessment and Plan:  1. Persistent atrial fibrillation/atrial flutter: He is in atrial flutter today. Rate is controlled on Toprol and Diltiazem. He has not tolerated amiodarone in the past due to pulmonary issues. I think it reasonable to assume that some of his dyspnea is from the atrial fibrillation and flutter but he is rate controlled now. He has evidence of interstitial edema and bilateral pleural effusions on chest x-ray. This could be associated with diastolic dysfunction which has been worsened by his atrial arrhythmias. I have discussed attempt at cardioversion to see if restoring sinus rhythm would help with his diuresis. He has been on Xarelto for over 30 days. He will need to continue Xarelto post cardioversion.  -Will plan DCCV at [facility] 06/04/16. He will not need a TEE. Will check BMET,CBC today -Continue Toprol and Diltiazem. Will continue Xarelto. -If he does not maintain sinus, will need EP referral for advanced medication options.   2. Acute on Chronic diastolic CHF: Volume status seems to be stable on Lasix 40 mg daily. His weight is down 3 lbs over last month and he has no LE edema. Question if his pleural effusions are related to diastolic dysfunction that has been worsened by his atrial fib/flutter. Difficult to titrate Lasix given his chronic kidney disease. I have spoken to Dr. Allena Katz today and have asked him to arrange a f/u visit in the Nephrology office to discuss his diuretics and renal failure.     3. HLD: Continue statin. Lipids followed in primary care. LDL is well controlled.   4. Dyspnea: His dyspnea is likely multifactorial with some degree of COPD, bilateral pleural effusions  that may have occurred due to diastolic CHF/atrial fib/flutter in setting of chronic kidney disease with inability to really push his Lasix. Cannot fully exclude ischemic heart disease even though his stress test was ok. He smoked 100 pack years so CAD is likely. He is not a good cath candidate right now given his renal issues. He is having no chest pain. Follow up with Dr. Sherene Sires next week in the Pulmonary office to reassess his COPD/pleural effusions.   Current medicines are reviewed at length with the patient today.  The patient does not have concerns regarding medicines.  The following changes have been made:  no change  Labs/ tests ordered today include:   Orders Placed This Encounter  Procedures  . Basic Metabolic Panel (BMET)  . CBC  . EKG 12-Lead     Disposition:   FU with me in 4-6 weeks   Signed, Verne Carrow, MD 05/30/2016 11:06 AM    [facility] [address] [place], [place], [place]  [address] Phone: [phone]; Fax: [phone]

## 2016-05-31 ENCOUNTER — Other Ambulatory Visit: Payer: Self-pay | Admitting: Family Medicine

## 2016-06-04 ENCOUNTER — Ambulatory Visit (HOSPITAL_COMMUNITY)
Admission: RE | Admit: 2016-06-04 | Discharge: 2016-06-04 | Disposition: A | Discharge status: Home / Self Care | Payer: PPO | Source: Ambulatory Visit | Attending: Cardiology | Admitting: Cardiology

## 2016-06-04 ENCOUNTER — Encounter (HOSPITAL_COMMUNITY)
Admission: RE | Disposition: A | Discharge status: Home / Self Care | Payer: Self-pay | Source: Ambulatory Visit | Attending: Cardiology

## 2016-06-04 ENCOUNTER — Encounter (HOSPITAL_COMMUNITY): Payer: Self-pay | Admitting: *Deleted

## 2016-06-04 ENCOUNTER — Ambulatory Visit (HOSPITAL_COMMUNITY): Payer: PPO | Admitting: Certified Registered Nurse Anesthetist

## 2016-06-04 DIAGNOSIS — Z7901 Long term (current) use of anticoagulants: Secondary | ICD-10-CM | POA: Diagnosis not present

## 2016-06-04 DIAGNOSIS — I4891 Unspecified atrial fibrillation: Secondary | ICD-10-CM

## 2016-06-04 DIAGNOSIS — K219 Gastro-esophageal reflux disease without esophagitis: Secondary | ICD-10-CM | POA: Diagnosis not present

## 2016-06-04 DIAGNOSIS — M1990 Unspecified osteoarthritis, unspecified site: Secondary | ICD-10-CM | POA: Insufficient documentation

## 2016-06-04 DIAGNOSIS — I5032 Chronic diastolic (congestive) heart failure: Secondary | ICD-10-CM | POA: Diagnosis not present

## 2016-06-04 DIAGNOSIS — Z79899 Other long term (current) drug therapy: Secondary | ICD-10-CM | POA: Diagnosis not present

## 2016-06-04 DIAGNOSIS — E1122 Type 2 diabetes mellitus with diabetic chronic kidney disease: Secondary | ICD-10-CM | POA: Insufficient documentation

## 2016-06-04 DIAGNOSIS — N189 Chronic kidney disease, unspecified: Secondary | ICD-10-CM | POA: Diagnosis not present

## 2016-06-04 DIAGNOSIS — Z87891 Personal history of nicotine dependence: Secondary | ICD-10-CM | POA: Insufficient documentation

## 2016-06-04 DIAGNOSIS — I4892 Unspecified atrial flutter: Secondary | ICD-10-CM | POA: Diagnosis not present

## 2016-06-04 DIAGNOSIS — J449 Chronic obstructive pulmonary disease, unspecified: Secondary | ICD-10-CM | POA: Diagnosis not present

## 2016-06-04 DIAGNOSIS — I130 Hypertensive heart and chronic kidney disease with heart failure and stage 1 through stage 4 chronic kidney disease, or unspecified chronic kidney disease: Secondary | ICD-10-CM | POA: Insufficient documentation

## 2016-06-04 DIAGNOSIS — E785 Hyperlipidemia, unspecified: Secondary | ICD-10-CM | POA: Diagnosis not present

## 2016-06-04 HISTORY — PX: CARDIOVERSION: SHX1299

## 2016-06-04 SURGERY — CARDIOVERSION
Anesthesia: Monitor Anesthesia Care

## 2016-06-04 MED ORDER — PROPOFOL 10 MG/ML IV BOLUS
INTRAVENOUS | Status: DC | PRN
  Administered 2016-06-04: 60 mg via INTRAVENOUS

## 2016-06-04 MED ORDER — SODIUM CHLORIDE 0.9 % IV SOLN
INTRAVENOUS | Status: DC
  Administered 2016-06-04 (×2): via INTRAVENOUS

## 2016-06-04 MED ORDER — LIDOCAINE HCL (CARDIAC) 20 MG/ML IV SOLN
INTRAVENOUS | Status: DC | PRN
  Administered 2016-06-04: 40 mg via INTRATRACHEAL

## 2016-06-04 NOTE — Transfer of Care (Signed)
Immediate Anesthesia Transfer of Care Note  Patient: Mark [name]  Procedure(s) Performed: Procedure(s): CARDIOVERSION (N/A)  Patient Location: Endoscopy Unit  Anesthesia Type:MAC  Level of Consciousness: awake, alert , oriented and patient cooperative  Airway & Oxygen Therapy: Patient Spontanous Breathing and Patient connected to nasal cannula oxygen  Post-op Assessment: Report given to RN, Post -op Vital signs reviewed and stable and Patient moving all extremities X 4  Post vital signs: Reviewed and stable  Last Vitals:  Vitals:   06/04/16 1238 06/04/16 1330  BP: (!) 160/81 135/71  Pulse: 76 (!) 50  Resp: (!) 21 13  Temp: 36.6 C     Last Pain:  Vitals:   06/04/16 1238  TempSrc: Oral         Complications: No apparent anesthesia complications

## 2016-06-04 NOTE — Discharge Instructions (Signed)
Electrical Cardioversion, Care After °Refer to this sheet in the next few weeks. These instructions provide you with information on caring for yourself after your procedure. Your health care provider may also give you more specific instructions. Your treatment has been planned according to current medical practices, but problems sometimes occur. Call your health care provider if you have any problems or questions after your procedure. °WHAT TO EXPECT AFTER THE PROCEDURE °After your procedure, it is typical to have the following sensations: °· Some redness on the skin where the shocks were delivered. If this is tender, a sunburn lotion or hydrocortisone cream may help. °· Possible return of an abnormal heart rhythm within hours or days after the procedure. °HOME CARE INSTRUCTIONS °· Take medicines only as directed by your health care provider. Be sure you understand how and when to take your medicine. °· Learn how to feel your pulse and check it often. °· Limit your activity for 48 hours after the procedure or as directed by your health care provider. °· Avoid or minimize caffeine and other stimulants as directed by your health care provider. °SEEK MEDICAL CARE IF: °· You feel like your heart is beating too fast or your pulse is not regular. °· You have any questions about your medicines. °· You have bleeding that will not stop. °SEEK IMMEDIATE MEDICAL CARE IF: °· You are dizzy or feel faint. °· It is hard to breathe or you feel short of breath. °· There is a change in discomfort in your chest. °· Your speech is slurred or you have trouble moving an arm or leg on one side of your body. °· You get a serious muscle cramp that does not go away. °· Your fingers or toes turn cold or blue. °  °This information is not intended to replace advice given to you by your health care provider. Make sure you discuss any questions you have with your health care provider. °  °Document Released: 06/24/2013 Document Revised: 09/24/2014  Document Reviewed: 06/24/2013 °Elsevier Interactive Patient Education ©2016 [employer] ° °

## 2016-06-04 NOTE — Anesthesia Preprocedure Evaluation (Addendum)
Anesthesia Evaluation  Patient identified by MRN, date of birth, ID band Patient awake    Reviewed: Allergy & Precautions, NPO status , Patient's Chart, lab work & pertinent test results, reviewed documented beta blocker date and time   Airway Mallampati: II  TM Distance: >3 FB Neck ROM: Full    Dental  (+) Edentulous Upper, Edentulous Lower, Dental Advisory Given   Pulmonary shortness of breath and with exertion, COPD,  COPD inhaler, former smoker,     + decreased breath sounds      Cardiovascular hypertension, Pt. on home beta blockers and Pt. on medications + Peripheral Vascular Disease and +CHF  + dysrhythmias Atrial Fibrillation  Rhythm:Irregular Rate:Bradycardia     Neuro/Psych negative neurological ROS  negative psych ROS   GI/Hepatic Neg liver ROS, hiatal hernia, GERD  Medicated,  Endo/Other  diabetes, Well Controlled  Renal/GU CRFRenal disease  negative genitourinary   Musculoskeletal  (+) Arthritis ,   Abdominal Normal abdominal exam  (+)   Peds negative pediatric ROS (+)  Hematology negative hematology ROS (+)   Anesthesia Other Findings   Reproductive/Obstetrics negative OB ROS                           04/2016 Echo - Left ventricle: The cavity size was normal. Wall thickness was   normal. EF 60% - Ventricular septum: The contour showed diastolic flattening and   systolic flattening. These changes are consistent with RV volume   and pressure overload. - Mitral valve: There was mild regurgitation. - Left atrium: The atrium was mildly dilated. - Right ventricle: The cavity size was moderately dilated. Systolic   function was mildly reduced. - Right atrium: The atrium was moderately dilated. - Atrial septum: No defect or patent foramen ovale was identified. - Tricuspid valve: There was moderate regurgitation. - Pulmonary arteries: Systolic pressure was moderately to severely    increased. PA peak pressure: 59 mm Hg (S).  Anesthesia Physical Anesthesia Plan  ASA: III  Anesthesia Plan: MAC   Post-op Pain Management:    Induction: Intravenous  Airway Management Planned: Natural Airway  Additional Equipment:   Intra-op Plan:   Post-operative Plan:   Informed Consent: I have reviewed the patients History and Physical, chart, labs and discussed the procedure including the risks, benefits and alternatives for the proposed anesthesia with the patient or authorized representative who has indicated his/her understanding and acceptance.   Dental advisory given  Plan Discussed with: CRNA, Anesthesiologist and Surgeon  Anesthesia Plan Comments:        Anesthesia Quick Evaluation

## 2016-06-04 NOTE — Anesthesia Procedure Notes (Signed)
Procedure Name: MAC Date/Time: 06/04/2016 1:19 PM Performed by: Rogelia BogaMUELLER, Kollin Udell P Pre-anesthesia Checklist: Patient identified, Emergency Drugs available, Suction available, Patient being monitored and Timeout performed Patient Re-evaluated:Patient Re-evaluated prior to inductionOxygen Delivery Method: Ambu bag Preoxygenation: Pre-oxygenation with 100% oxygen Intubation Type: IV induction

## 2016-06-04 NOTE — Interval H&P Note (Signed)
History and Physical Interval Note:  06/04/2016 1:14 PM  Mark [name]  has presented today for surgery, with the diagnosis of afib  The various methods of treatment have been discussed with the patient and family. After consideration of risks, benefits and other options for treatment, the patient has consented to  Procedure(s): CARDIOVERSION (N/A) as a surgical intervention .  The patient's history has been reviewed, patient examined, no change in status, stable for surgery.  I have reviewed the patient's chart and labs.  Questions were answered to the patient's satisfaction.     Mark [name]

## 2016-06-04 NOTE — Anesthesia Postprocedure Evaluation (Signed)
Anesthesia Post Note  Patient: Mark [name]  Procedure(s) Performed: Procedure(s) (LRB): CARDIOVERSION (N/A)  Patient location during evaluation: PACU Anesthesia Type: MAC Level of consciousness: awake and alert Pain management: pain level controlled Vital Signs Assessment: post-procedure vital signs reviewed and stable Respiratory status: spontaneous breathing, nonlabored ventilation, respiratory function stable and patient connected to nasal cannula oxygen Cardiovascular status: stable and blood pressure returned to baseline Anesthetic complications: no    Last Vitals:  Vitals:   06/04/16 1238 06/04/16 1330  BP: (!) 160/81 135/71  Pulse: 76 (!) 50  Resp: (!) 21 13  Temp: 36.6 C     Last Pain:  Vitals:   06/04/16 1238  TempSrc: Oral                 Shelton SilvasKevin D Seara Hinesley

## 2016-06-04 NOTE — H&P (View-Only) (Signed)
Chief Complaint  Patient presents with  . Congestive Heart Failure   History of Present Illness: 70 yo male with history of paroxysmal atrial fibrillation, AAA, chronic diastolic CHF, GERD, HTN, HLD, chronic kidney disease and COPD who is here today for cardiac follow up. He has has been followed by Dr. Patty SermonsBrackbill. He was admitted in November 2014 with COPD and atrial fib with RVR. Echo showed normal LV function. He has been managed on Xarelto which was stopped for some time due to recurrent epistaxis. He has had issues with diastolic CHF. DCCV was attempted several times in January 2015 while on amiodarone and he maintained sinus for 2 years after that. Amiodarone stopped due to lung issues. He has been managed on daily Lasix. He has chronic kidney disease followed by Dr. Allena KatzPatel. He did well until July 2017 when he was seen in primary care by Dr. Tanya NonesPickard with dyspnea. He was volume overloaded and his Lasix was increased to 40 mg twice daily. He was back in atrial fibrillation with RVR. Toprol was increased to 100 mg daily. He has been on Xarelto. He has anemia felt to be due to his chronic kidney disease. Of note, he has not tolerated amiodarone in the past due to worsening respiratory issues. I saw him 04/12/16 and he complained of worsened dyspnea. Echo 04/26/16 with normal LV function, no valve issues. Stress myoview 04/26/16 with no ischemia. He has not had a cardiac cath in the past. No documented CAD. At his last visit here 05/03/16 we discussed having GI workup to discuss heme positive stool before proceeding with cardioversion. But he has not been able to get into the GI clinic. He has remained on Xarelto (reduced dose due to his renal insufficiency) with no evidence of GI bleeding. His stools are brown. He was seen in the Pulmonary office 05/08/16 and COPD felt to be stable and not contributing to his dyspnea. Chest x-ray 05/08/16 with moderate bilateral pleural effusions with mild interstitial edema.  Dr. Sherene SiresWert suggested consideration for thoracentesis if effusions worsen or do not resolve with diuresis. He has only been taking Lasix 40 mg daily due to renal insufficiency.  He is followed in Nephrology by Dr. Allena KatzPatel. Last creatinine 2.7.   He is here today for follow up. Weight is stable. His LE edema is stable, mild during the day and resolves at night. He has noticed to palpitations. No chest pain but still having dyspnea. This has improved some after starting Mucinex.   Primary Care Physician: Leo GrosserPICKARD,WARREN TOM, MD   Past Medical History:  Diagnosis Date  . AAA (abdominal aortic aneurysm) (HCC)    a. 09/2013: Resection and grafting of abdominal aortic aneurysm with insertion of an aorto by common iliac graft using 18 x 9 mm Hemashield Dacron graft.  . Arthritis    "little in my hands" (09/08/2013)  . Chronic diastolic CHF (congestive heart failure) (HCC)    a.  Echo (08/13/2013): EF 55-60%, normal wall motion, mild MR, mild LAE  . Chronic kidney disease (CKD), stage III (moderate)   . COPD (chronic obstructive pulmonary disease) (HCC)   . 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 GERD (gastroesophageal reflux disease)   . H/O hiatal hernia   . Hx of cardiovascular stress test    a. Myoview 09/2012:  no scar or ischemia  . Hyperlipidemia   . Hypertension    Sees Dr. Lynnea FerrierWarren Pickard  . PAF (paroxysmal atrial fibrillation) (HCC)  a. amiodarone Rx started 08/2013;  b. s/p TEE-DCCV 09/2013 => recurrent AFib => repeat DCCV => NSR;   c. Xarelto 15 QD due to CKD  . Pneumonia   . Pulmonary hypertension associated with unclear multi-factorial mechanisms (HCC)    Only on echo (8/17).      Past Surgical History:  Procedure Laterality Date  . ABDOMINAL AORTIC ANEURYSM REPAIR  10/15/2012   Procedure: ANEURYSM ABDOMINAL AORTIC REPAIR;  Surgeon: Pryor Ochoa, MD;  Location: [facility] OR;  Service: Vascular;  Laterality: N/A;  Resection and Grafting of Abdominal Aortic Aneurysm -  Aortobi-Iliac   . ABDOMINAL AORTIC ANEURYSM REPAIR  10-15-2012  . CARDIOVERSION N/A 09/22/2013   Procedure: CARDIOVERSION;  Surgeon: Lars Masson, MD;  Location: [facility] ENDOSCOPY;  Service: Cardiovascular;  Laterality: N/A;  . CARDIOVERSION  Jan. 12, 2015   Dr. Doylene Canning. Ladona Ridgel  . CARDIOVERSION N/A 09/28/2013   Procedure: CARDIOVERSION;  Surgeon: Marinus Maw, MD;  Location: [facility] CATH LAB;  Service: Cardiovascular;  Laterality: N/A;  . INGUINAL HERNIA REPAIR Right 12/22/2014   Procedure: OPEN RIGHT INGUINAL HERNIA REPAIR WITH MESH;  Surgeon: Axel Filler, MD;  Location: [facility] ORS;  Service: General;  Laterality: Right;  . INSERTION OF MESH N/A 12/22/2014   Procedure: INSERTION OF MESH;  Surgeon: Axel Filler, MD;  Location: [facility] ORS;  Service: General;  Laterality: N/A;  . KNEE ARTHROSCOPY Right   . TEE WITHOUT CARDIOVERSION N/A 09/22/2013   Procedure: TRANSESOPHAGEAL ECHOCARDIOGRAM (TEE);  Surgeon: Lars Masson, MD;  Location: [facility] ENDOSCOPY;  Service: Cardiovascular;  Laterality: N/A;    Current Outpatient Prescriptions  Medication Sig Dispense Refill  . atorvastatin (LIPITOR) 10 MG tablet Take 10 mg by mouth daily.    . furosemide (LASIX) 40 MG tablet TAKE 1 TABLET (40 MG TOTAL) BY MOUTH DAILY. 30 tablet 5  . Glycopyrrolate-Formoterol (BEVESPI AEROSPHERE) 9-4.8 MCG/ACT AERO Inhale 2 puffs into the lungs 2 (two) times daily. 1 Inhaler 0  . metoprolol succinate (TOPROL-XL) 100 MG 24 hr tablet Take 1 tablet (100 mg total) by mouth daily. Take with or immediately following a meal. 90 tablet 3  . pantoprazole (PROTONIX) 40 MG tablet TAKE 1 TABLET (40 MG TOTAL) BY MOUTH DAILY. 30 tablet 3  . Rivaroxaban (XARELTO) 15 MG TABS tablet Take 1 tablet (15 mg total) by mouth daily with supper. 30 tablet 11  . tamsulosin (FLOMAX) 0.4 MG CAPS capsule TAKE ONE CAPSULE BY MOUTH EVERY DAY 90 capsule 3  . VENTOLIN HFA 108 (90 Base) MCG/ACT inhaler INHALE 2 PUFFS INTO THE LUNGS EVERY 6 (SIX) HOURS AS NEEDED FOR  WHEEZING OR SHORTNESS OF BREATH. 18 g 5  . diltiazem (TAZTIA XT) 180 MG 24 hr capsule Take 2 capsules (360 mg total) by mouth daily. 180 capsule 3  . doxazosin (CARDURA) 4 MG tablet Take 1 tablet (4 mg total) by mouth daily. 90 tablet 3   No current facility-administered medications for this visit.     Allergies  Allergen Reactions  . Niaspan [Niacin Er] Anaphylaxis    Social History   Social History  . Marital status: Married    Spouse name: Nicole Cella  . Number of children: 4  . Years of education: N/A   Occupational History  . Works PT as a Education administrator    Social History Main Topics  . Smoking status: Former Smoker    Packs/day: 1.00    Years: 56.00    Types: Cigarettes    Quit date: 07/31/2013  . Smokeless tobacco:  Former Neurosurgeon    Types: Chew  . Alcohol use No  . Drug use: No  . Sexual activity: Not Currently   Other Topics Concern  . Not on file   Social History Narrative   Married.  Lives with wife.  Ambulates without assistance.    Family History  Problem Relation Age of Onset  . Diabetes Mother   . Tuberculosis Mother   . Heart disease Father   . Heart attack Father   . Heart disease Son     Blood clot:  Arm    Review of Systems:  As stated in the HPI and otherwise negative.   BP 138/80   Pulse 60   Ht 5\' 7"  (1.702 m)   Wt 144 lb 1.9 oz (65.4 kg)   BMI 22.57 kg/m   Physical Examination: General: Well developed, well nourished, NAD  HEENT: OP clear, mucus membranes moist  SKIN: warm, dry. No rashes. Neuro: No focal deficits  Musculoskeletal: Muscle strength 5/5 all ext  Psychiatric: Mood and affect normal  Neck: No JVD, no carotid bruits, no thyromegaly, no lymphadenopathy.  Lungs:Clear bilaterally, no wheezes, rhonci, crackles Cardiovascular: Irreg irreg with no murmurs, gallops or rubs. Abdomen:Soft. Bowel sounds present. Non-tender.  Extremities: Trace bilateral lower extremity edema. Pulses are 2 + in the bilateral DP/PT.  Echo 04/26/16: -  Left ventricle: The cavity size was normal. Wall thickness was   normal. - Ventricular septum: The contour showed diastolic flattening and   systolic flattening. These changes are consistent with RV volume   and pressure overload. - Mitral valve: There was mild regurgitation. - Left atrium: The atrium was mildly dilated. - Right ventricle: The cavity size was moderately dilated. Systolic   function was mildly reduced. - Right atrium: The atrium was moderately dilated. - Atrial septum: No defect or patent foramen ovale was identified. - Tricuspid valve: There was moderate regurgitation. - Pulmonary arteries: Systolic pressure was moderately to severely   increased. PA peak pressure: 59 mm Hg (S).  Chest x-ray 05/08/16: FINDINGS: There is hyperinflation of the lungs compatible with COPD. Moderate bilateral pleural effusions, left slightly greater than right. Bibasilar atelectasis. Heart is borderline in size. Diffuse interstitial prominence, increasing since prior study. This may reflect interstitial edema.  IMPRESSION: Moderate bilateral pleural effusions with bibasilar atelectasis and diffuse interstitial prominence, possibly interstitial edema.  Nuclear stress test 04/26/16:  Nuclear stress EF: 54%.  There was no ST segment deviation noted during stress.  This is a low risk study.  The left ventricular ejection fraction is mildly decreased (45-54%).  No ischemia was identified.  EKG:  EKG is ordered today. The ekg ordered today demonstrates atrial flutter with variable block. 64 bpm.   Recent Labs: 04/20/2016: ALT 12 05/08/2016: BUN 44; Creatinine, Ser 2.72; Hemoglobin 12.8; Platelets 194.0; Potassium 5.6; Pro B Natriuretic peptide (BNP) 1,079.0; Sodium 137; TSH 0.45   Lipid Panel    Component Value Date/Time   CHOL 123 (L) 04/20/2016 0908   TRIG 113 04/20/2016 0908   HDL 44 04/20/2016 0908   CHOLHDL 2.8 04/20/2016 0908   VLDL 23 04/20/2016 0908   LDLCALC 56  04/20/2016 0908     Wt Readings from Last 3 Encounters:  05/30/16 144 lb 1.9 oz (65.4 kg)  05/08/16 147 lb (66.7 kg)  05/03/16 147 lb 6.4 oz (66.9 kg)     Other studies Reviewed: Additional studies/ records that were reviewed today include: . Review of the above records demonstrates:   Assessment and Plan:  1. Persistent atrial fibrillation/atrial flutter: He is in atrial flutter today. Rate is controlled on Toprol and Diltiazem. He has not tolerated amiodarone in the past due to pulmonary issues. I think it reasonable to assume that some of his dyspnea is from the atrial fibrillation and flutter but he is rate controlled now. He has evidence of interstitial edema and bilateral pleural effusions on chest x-ray. This could be associated with diastolic dysfunction which has been worsened by his atrial arrhythmias. I have discussed attempt at cardioversion to see if restoring sinus rhythm would help with his diuresis. He has been on Xarelto for over 30 days. He will need to continue Xarelto post cardioversion.  -Will plan DCCV at [facility] 06/04/16. He will not need a TEE. Will check BMET,CBC today -Continue Toprol and Diltiazem. Will continue Xarelto. -If he does not maintain sinus, will need EP referral for advanced medication options.   2. Acute on Chronic diastolic CHF: Volume status seems to be stable on Lasix 40 mg daily. His weight is down 3 lbs over last month and he has no LE edema. Question if his pleural effusions are related to diastolic dysfunction that has been worsened by his atrial fib/flutter. Difficult to titrate Lasix given his chronic kidney disease. I have spoken to Dr. Allena Katz today and have asked him to arrange a f/u visit in the Nephrology office to discuss his diuretics and renal failure.     3. HLD: Continue statin. Lipids followed in primary care. LDL is well controlled.   4. Dyspnea: His dyspnea is likely multifactorial with some degree of COPD, bilateral pleural effusions  that may have occurred due to diastolic CHF/atrial fib/flutter in setting of chronic kidney disease with inability to really push his Lasix. Cannot fully exclude ischemic heart disease even though his stress test was ok. He smoked 100 pack years so CAD is likely. He is not a good cath candidate right now given his renal issues. He is having no chest pain. Follow up with Dr. Sherene Sires next week in the Pulmonary office to reassess his COPD/pleural effusions.   Current medicines are reviewed at length with the patient today.  The patient does not have concerns regarding medicines.  The following changes have been made:  no change  Labs/ tests ordered today include:   Orders Placed This Encounter  Procedures  . Basic Metabolic Panel (BMET)  . CBC  . EKG 12-Lead     Disposition:   FU with me in 4-6 weeks   Signed, Verne Carrow, MD 05/30/2016 11:06 AM    [facility] [address] [place], [place], [place]  [address] Phone: [phone]; Fax: [phone]

## 2016-06-04 NOTE — CV Procedure (Signed)
    Cardioversion Note  Hetty BlendHorace T Mahabir [id number] 1946/03/26  Procedure: DC Cardioversion Indications: atrial fibrillation  Procedure Details Consent: Obtained Time Out: Verified patient identification, verified procedure, site/side was marked, verified correct patient position, special equipment/implants available, Radiology Safety Procedures followed,  medications/allergies/relevent history reviewed, required imaging and test results available.  Performed  The patient has been on adequate anticoagulation.  The patient received IV propofol 60 mg and IV lidocaine 40 mg for sedation.  Synchronous cardioversion was performed at 120 joules.  The cardioversion was successful.   Complications: No apparent complications Patient did tolerate procedure well.   Tobias AlexanderKatarina Kaushik Maul, MD, [facility] 06/04/2016, 1:37 PM

## 2016-06-05 ENCOUNTER — Encounter: Payer: Self-pay | Admitting: Internal Medicine

## 2016-06-05 ENCOUNTER — Ambulatory Visit (INDEPENDENT_AMBULATORY_CARE_PROVIDER_SITE_OTHER): Payer: PPO | Admitting: Internal Medicine

## 2016-06-05 VITALS — BP 150/80 | HR 63 | Ht 67.0 in | Wt 142.0 lb

## 2016-06-05 DIAGNOSIS — Z23 Encounter for immunization: Secondary | ICD-10-CM

## 2016-06-05 DIAGNOSIS — J449 Chronic obstructive pulmonary disease, unspecified: Secondary | ICD-10-CM

## 2016-06-05 DIAGNOSIS — J9611 Chronic respiratory failure with hypoxia: Secondary | ICD-10-CM | POA: Diagnosis not present

## 2016-06-05 MED ORDER — BUDESONIDE-FORMOTEROL FUMARATE 80-4.5 MCG/ACT IN AERO: 2.0000 | INHALATION_SPRAY | Freq: Two times a day (BID) | RESPIRATORY_TRACT | 0 refills | Status: AC

## 2016-06-05 NOTE — Progress Notes (Signed)
Subjective:    Patient ID: Mark [name], male    DOB: Jun 11, 1946,   MRN: [id number]    Brief patient profile:  69yowm quit smoking 12/2012 with doe then that has variably  worsened since  so referred by Dr Tanya Nones to pulmonary clinic 01/21/15  History of Present Illness  01/21/2015 [facility] office visit/ Mark [name]  On amiodarone/ symb/spiriva with min airflow obst on pfts 01/21/15  Chief Complaint  Patient presents with  . Pulmonary Consult    Referred by Dr. Lynnea Ferrier. Pt states he was dxed with COPD. He c/o SOB and cough for the past yr.  He gets SOB walking 200 ft on a flat suface. Cough is non prod most of the time.   onset of doe and dry cough indolent / progressive while on amiodarone for afib/  just started 02 at hs  rec Stop amiodarone today and spiriva for now  Continue symbicort Take 2 puffs first thing in am and then another 2 puffs about 12 hours later Only use your albuterol (ventolin)  as a rescue medication  pred 10 mg 2 daily with bast until improving and then 1 daily until return    02/22/2015 f/u ov/Mark [name] re: ? amio ild/ gold III copd with reversibility  Chief Complaint  Patient presents with  . Follow-up    PFT done today. Pt states breathing is doing better. He states that he can walk twice as far without having to stop. Using albuterol 1 x per wk on average.    rec Stop metaprolol and take bisoprol 5 mg one daily  Work on inhaler technique:   Stay on symbicort 160  Take 2 puffs first thing in am and then another 2 puffs about 12 hours later.  Prednisone 10 mg one per day x one week then half a pill x one week and off     05/08/2016 acute extended  f/u ov/Mark [name] re: GOLD III  Copd/ chf/ cri maint rx = symb 160 2bid Chief Complaint  Patient presents with  . Acute Visit    Pt c/o increased SOB for the past 2-3 months. He gets out of breath walking back from the mailbox and breathing is esp worse first thing in the am. He coughed up min amount of blood  this am. He has been using albuterol inhaler 2 x daily on average.   cough is not unusual pattern from baseline  New = sob Only with activity / not noct   Get to mb ok but stops half way up slt incline / not sure saba works as uses with am symbicort  rec Plan A = Automatic =  BEVESPI  Take 2 puffs first thing in am and then another 2 puffs about 12 hours later.  Work on inhaler technique:  Plan B = Backup Only use your albuterol (ventolin)   Please schedule a follow up office visit in 4 weeks, sooner if needed    06/05/2016  f/u ov/Mark [name] re: GOLD II copd worse on bevespi  Chief Complaint  Patient presents with  . Follow-up    Pt concerned about wt loss. He has occ swelling in his ankles. He states his breathing is no better since the last visit.    Having to use more saba off symbicort and on bevespi with very poor hfa/ insight into meds/ problems Still sob mb to house s change       No obvious day to day or daytime variabilty or assoc  excess/ purulent sputum or mucus plugs  or cp or chest tightness, subjective wheeze overt sinus or hb symptoms. No unusual exp hx or h/o childhood pna/ asthma or knowledge of premature birth.  Sleeping ok without nocturnal  or early am exacerbation  of respiratory  c/o's or need for noct saba. Also denies any obvious fluctuation of symptoms with weather or environmental changes or other aggravating or alleviating factors except as outlined above   Current Medications, Allergies, Complete Past Medical History, Past Surgical History, Family History, and Social History were reviewed in [facility] electronic medical record.  ROS  The following are not active complaints unless bolded sore throat, dysphagia, dental problems, itching, sneezing,  nasal congestion or excess/ purulent secretions, ear ache,   fever, chills, sweats, unintended wt loss, pleuritic or exertional cp, hemoptysis,  orthopnea pnd or leg swelling, presyncope, palpitations, heartburn,  abdominal pain, anorexia, nausea, vomiting, diarrhea  or change in bowel or urinary habits, change in stools or urine, dysuria,hematuria,  rash, arthralgias, visual complaints, headache, numbness weakness or ataxia or problems with walking or coordination,  change in mood/affect or memory.            Objective:  Physical Exam  amb wm nad   06/05/2016        142  05/08/2016        147 02/22/15              152    01/21/15 144 lb 12.8 oz (65.681 kg)  01/07/15 146 lb (66.225 kg)  01/04/15 146 lb (66.225 kg)    Vital signs reviewed - note sats only 85% on arrival RA   HEENT: nl dentition, turbinates, and orophanx. Nl external ear canals without cough reflex   NECK :  without JVD/Nodes/TM/ nl carotid upstrokes bilaterally   LUNGS: no acc muscle use, subtle insp crackles both bases without cough on insp or exp maneuvers and dullness in bases L > R    CV:  IRIR  no s3 or murmur or increase in P2,    1+ bilateral sym  Lower ext  edema   ABD:  soft and nontender with nl excursion in the supine position. No bruits or organomegaly, bowel sounds nl  MS:  warm without deformities, calf tenderness, cyanosis or clubbing  SKIN: warm and dry without lesions    NEURO:  alert, approp, no deficits        CXR PA and Lateral:   05/08/2016 :    I personally reviewed images and agree with radiology impression as follows:    Moderate bilateral pleural effusions with bibasilar atelectasis and diffuse interstitial prominence, possibly interstitial edema.     Labs  reviewed:      Chemistry      Component Value Date/Time   NA 138 05/30/2016 1107   NA 135 09/13/2014 1553   K 4.9 05/30/2016 1107   CL 105 05/30/2016 1107   CO2 22 05/30/2016 1107   BUN 33 (H) 05/30/2016 1107   BUN 45 (H) 09/13/2014 1553   CREATININE 2.59 (H) 05/30/2016 1107      Component Value Date/Time   CALCIUM 9.3 05/30/2016 1107   ALKPHOS 77 04/20/2016 0908   AST 9 (L) 04/20/2016 0908   ALT 12 04/20/2016 0908    BILITOT 0.6 04/20/2016 0908        Lab Results  Component Value Date   WBC 5.9 05/30/2016   HGB 12.4 (L) 05/30/2016   HCT 37.2 (L) 05/30/2016   MCV  88.8 05/30/2016   PLT 167 05/30/2016        Lab Results  Component Value Date   TSH 0.45 05/08/2016     Lab Results  Component Value Date   PROBNP 1,079.0 (H) 05/08/2016       Lab Results  Component Value Date   ESRSEDRATE 28 (H) 05/08/2016   ESRSEDRATE 52 (H) 01/21/2015              Assessment & Plan:

## 2016-06-05 NOTE — Patient Instructions (Addendum)
Plan A = Automatic = symbiocort 160 Take 2 puffs first thing in am and then another 2 puffs about 12 hours later.    Work on inhaler technique:  relax and gently blow all the way out then take a nice smooth deep breath back in, triggering the inhaler at same time you start breathing in.  Hold for up to 5 seconds if you can. Blow out thru nose. Rinse and gargle with water when done     Plan B = Backup Only use your albuterol (ventolin)  as a rscue medication to be used if you can't catch your breath by resting or doing a relaxed purse lip breathing pattern.  - The less you use it, the better it will work when you need it. - Ok to use the inhaler up to 2 puffs  every 4 hours if you must but call for appointment if use goes up over your usual need - Don't leave home without it !!  (think of it like the spare tire for your car)   Please see patient coordinator before you leave today  to schedule portable 02 and we will have you evaluated for POC For now 02 = 2lpm at bedtime and with activity beyond walking across the room     See Tammy NP in  4 weeks with all your medications, even over the counter meds, separated in two separate bags, the ones you take no matter what vs the ones you stop once you feel better and take only as needed when you feel you need them.   Tammy  will generate for you a new user friendly medication calendar that will put us all on the same page re: your medication use.     Without this process, it simply isn't possible to assure that we are providing  your outpatient care  with  the attention to detail we feel you deserve.   If we cannot assure that you're getting that kind of care,  then we cannot manage your problem effectively from this clinic.  Once you have seen Tammy and we are sure that we're all on the same page with your medication use she will arrange follow up with me.

## 2016-06-05 NOTE — Assessment & Plan Note (Addendum)
-   spirometry 01/21/2015  Minimal airflow obst > d/c spiriva/ continue symbicort    - PFTs 02/22/2015  FEV1 1.45  (51%) ratio 59 after 23% resp to saba p am symbicort and dlco 40% and corrects to 57 and symptomatically better off spiriva and on prednisone 10 mg daily for ? amio lung   - 05/08/2016  After extensive coaching HFA effectiveness =    75% > changed symbicort to bevespi x 4 week sample trial > worse - Spirometry 06/05/2016  FEV1 0.92 (32%)  Ratio 49      DDX of  difficult airways management almost all start with A and  include Adherence, Ace Inhibitors, Acid Reflux, Active Sinus Disease, Alpha 1 Antitripsin deficiency, Anxiety masquerading as Airways dz,  ABPA,  Allergy(esp in young), Aspiration (esp in elderly), Adverse effects of meds,  Active smokers, A bunch of PE's (a small clot burden can't cause this syndrome unless there is already severe underlying pulm or vascular dz with poor reserve) plus two Bs  = Bronchiectasis and Beta blocker use..and one C= CHF   Adherence is always the initial "prime suspect" and is a multilayered concern that requires a "trust but verify" approach in every patient - starting with knowing how to use medications, especially inhalers, correctly, keeping up with refills and understanding the fundamental difference between maintenance and prns vs those medications only taken for a very short course and then stopped and not refilled.  - The proper method of use, as well as anticipated side effects, of a metered-dose inhaler are discussed and demonstrated to the patient. Improved effectiveness after extensive coaching during this visit to a level of approximately 75 % from a baseline of 25 % > try again on symbicort 160 2bid and then do med rec -  To keep things simple, I have asked the patient to first separate medicines that are perceived as maintenance, that is to be taken daily "no matter what", from those medicines that are taken on only on an as-needed basis and I  have given the patient examples of both, and then return to see our NP to generate a  detailed  medication calendar which should be followed until the next physician sees the patient and updates it.   ? Acid (or non-acid) GERD > always difficult to exclude as up to 75% of pts in some series report no assoc GI/ Heartburn symptoms and he is worse since stopped ppi on hi sown but there are plenty of other reasons this may hav happned  ? Allergy/asthmatic component suggested by doing better on symb than bevespi> resume symbicort 160 2bid ? Then add spiriva respimat if not improving   ? Chf/cri/vol overload with a cardiac asthma component > rx cards/ renal planned  I had an extended discussion with the patient reviewing all relevant studies completed to date and  lasting 25 minutes of a 40  minute exteneded visit    Each maintenance medication was reviewed in detail including most importantly the difference between maintenance and prns and under what circumstances the prns are to be triggered using an action plan format that is not reflected in the computer generated alphabetically organized AVS.    Please see instructions for details which were reviewed in writing and the patient given a copy highlighting the part that I personally wrote and discussed at today's ov.

## 2016-06-07 ENCOUNTER — Telehealth: Payer: Self-pay | Admitting: Internal Medicine

## 2016-06-07 NOTE — Telephone Encounter (Signed)
Called and spoke with pt and he is aware and [facility] that [facility]  They will set this up for him.

## 2016-06-07 NOTE — Telephone Encounter (Signed)
Called and spoke with pt and he stated that he called [facility] and they advised him that they do not have an order that was sent in yesterday.  [facility].  Waiting for a call back.

## 2016-06-08 NOTE — Telephone Encounter (Signed)
[facility] but he has not received anything  [facility] Berna SpareMarcus who reported that pt was last setup with a homefill O2 system in 2016 and this has not yet been picked up.  RT will call pt to set up an eval date/time for the POC.    Called spoke with patient to inform him of the above.  Pt stated he does not have ANY O2 at home and the 2016 O2 has already been picked up that same year and he has the supporting documentation for this.  Advised pt will [facility] to [facility] to see if we can figure out how to get him O2.  Called spoke with Melissa and discussed the above with her.  Melissa will have someone contact pt and will call me back.

## 2016-06-08 NOTE — Telephone Encounter (Signed)
Melissa returned call She was able to find that pt's O2 was indeed picked up in August 2016 BUT she can use the order as a new start and he will be set up with O2 today.  [facility].  Called pt and left a detailed message on his named VM to inform him of the above Nothing further needed, will sign off

## 2016-06-10 ENCOUNTER — Encounter: Payer: Self-pay | Admitting: Internal Medicine

## 2016-06-10 NOTE — Assessment & Plan Note (Signed)
06/05/2016 o2 sat RA at rest 85% >   sat on 2lpm o2 93%  rec 2lpm and eval for POC

## 2016-06-22 ENCOUNTER — Encounter: Payer: Self-pay | Admitting: Physician Assistant

## 2016-06-28 ENCOUNTER — Ambulatory Visit (INDEPENDENT_AMBULATORY_CARE_PROVIDER_SITE_OTHER): Payer: PPO | Admitting: Cardiovascular Disease

## 2016-06-28 ENCOUNTER — Encounter: Payer: Self-pay | Admitting: Cardiovascular Disease

## 2016-06-28 VITALS — BP 158/92 | HR 71 | Ht 67.0 in | Wt 150.6 lb

## 2016-06-28 DIAGNOSIS — R0600 Dyspnea, unspecified: Secondary | ICD-10-CM

## 2016-06-28 DIAGNOSIS — I483 Typical atrial flutter: Secondary | ICD-10-CM

## 2016-06-28 DIAGNOSIS — I5033 Acute on chronic diastolic (congestive) heart failure: Secondary | ICD-10-CM

## 2016-06-28 NOTE — Patient Instructions (Addendum)
Medication Instructions:  Your physician recommends that you continue on your current medications as directed. Please refer to the Current Medication list given to you today.   Labwork: none  Testing/Procedures: none  Follow-Up: Your physician recommends that you schedule a follow-up appointment in: 3 months. --Scheduled for January 17,2018 at 10:00    Any Other Special Instructions Will Be Listed Below (If Applicable).     If you need a refill on your cardiac medications before your next appointment, please call your pharmacy.

## 2016-06-28 NOTE — Progress Notes (Signed)
Chief Complaint  Patient presents with  . Shortness of Breath   History of Present Illness: 70 yo male with history of paroxysmal atrial fibrillation, AAA, chronic diastolic CHF, GERD, HTN, HLD, chronic kidney disease and COPD who is here today for cardiac follow up. He has been followed by Dr. Patty Sermons. He was admitted in November 2014 with COPD and atrial fib with RVR. Echo showed normal LV function. He has been managed on Xarelto which was stopped for some time due to recurrent epistaxis. He has had issues with diastolic CHF. DCCV was attempted several times in January 2015 while on amiodarone and he maintained sinus for 2 years after that. Amiodarone stopped due to lung issues. He has been managed on daily Lasix. He has chronic kidney disease followed by Dr. Allena Katz. He did well until July 2017 when he was seen in primary care by Dr. Tanya Nones with dyspnea. He was volume overloaded and his Lasix was increased to 40 mg twice daily. He was back in atrial fibrillation with RVR. Toprol was increased to 100 mg daily. He was continued on Xarelto. He has anemia felt to be due to his chronic kidney disease. I saw him 04/12/16 and he complained of worsened dyspnea. Echo 04/26/16 with normal LV function, no valve issues. Stress myoview 04/26/16 with no ischemia. He has not had a cardiac cath in the past. No documented CAD. At his visit here 05/03/16 we discussed having GI workup to discuss heme positive stool before proceeding with cardioversion but he had no further evidence of GI bleeding and could not get into the GI clinic. Cardioversion 06/04/16 and successfully converted to sinus. He was seen in the Pulmonary office 05/08/16 and COPD felt to be stable and not contributing to his dyspnea. Chest x-ray 05/08/16 with moderate bilateral pleural effusions with mild interstitial edema. Dr. Sherene Sires suggested consideration for thoracentesis if effusions worsen or do not resolve with diuresis. He was seen again in the pulmonary  office 06/05/16 and inhalers adjusted. He has only been taking Lasix 40 mg daily due to renal insufficiency.  He is followed in Nephrology by Dr. Allena Katz. Last creatinine 2.7.   He is here today for follow up. Weight is up 8 lbs over the last month. His LE edema is stable, mild during the day and resolves at night. He has noticed no palpitations. No chest pain but still having dyspnea. He feels that is retaining fluid.   Primary Care Physician: Leo Grosser, MD   Past Medical History:  Diagnosis Date  . AAA (abdominal aortic aneurysm) (HCC)    a. 09/2013: Resection and grafting of abdominal aortic aneurysm with insertion of an aorto by common iliac graft using 18 x 9 mm Hemashield Dacron graft.  . Arthritis    "little in my hands" (09/08/2013)  . Chronic diastolic CHF (congestive heart failure) (HCC)    a.  Echo (08/13/2013): EF 55-60%, normal wall motion, mild MR, mild LAE  . Chronic kidney disease (CKD), stage III (moderate)   . COPD (chronic obstructive pulmonary disease) (HCC)   . 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 GERD (gastroesophageal reflux disease)   . H/O hiatal hernia   . Hx of cardiovascular stress test    a. Myoview 09/2012:  no scar or ischemia  . Hyperlipidemia   . Hypertension    Sees Dr. Lynnea Ferrier  . PAF (paroxysmal atrial fibrillation) (HCC)    a. amiodarone Rx started 08/2013;  b. s/p TEE-DCCV  09/2013 => recurrent AFib => repeat DCCV => NSR;   c. Xarelto 15 QD due to CKD  . Pneumonia   . Pulmonary hypertension associated with unclear multi-factorial mechanisms    Only on echo (8/17).      Past Surgical History:  Procedure Laterality Date  . ABDOMINAL AORTIC ANEURYSM REPAIR  10/15/2012   Procedure: ANEURYSM ABDOMINAL AORTIC REPAIR;  Surgeon: Pryor Ochoa, MD;  Location: [facility] OR;  Service: Vascular;  Laterality: N/A;  Resection and Grafting of Abdominal Aortic Aneurysm - Aortobi-Iliac   . ABDOMINAL AORTIC ANEURYSM REPAIR  10-15-2012  .  CARDIOVERSION N/A 09/22/2013   Procedure: CARDIOVERSION;  Surgeon: Lars Masson, MD;  Location: [facility] ENDOSCOPY;  Service: Cardiovascular;  Laterality: N/A;  . CARDIOVERSION  Jan. 12, 2015   Dr. Doylene Canning. Ladona Ridgel  . CARDIOVERSION N/A 09/28/2013   Procedure: CARDIOVERSION;  Surgeon: Marinus Maw, MD;  Location[facility] CATH LAB;  Service: Cardiovascular;  Laterality: N/A;  . CARDIOVERSION N/A 06/04/2016   Procedure: CARDIOVERSION;  Surgeon: Lars Masson, MD;  Location: [facility] ENDOSCOPY;  Service: Cardiovascular;  Laterality: N/A;  . INGUINAL HERNIA REPAIR Right 12/22/2014   Procedure: OPEN RIGHT INGUINAL HERNIA REPAIR WITH MESH;  Surgeon: Axel Filler, MD;  Location: [facility] ORS;  Service: General;  Laterality: Right;  . INSERTION OF MESH N/A 12/22/2014   Procedure: INSERTION OF MESH;  Surgeon: Axel Filler, MD;  Location: [facility] ORS;  Service: General;  Laterality: N/A;  . KNEE ARTHROSCOPY Right   . TEE WITHOUT CARDIOVERSION N/A 09/22/2013   Procedure: TRANSESOPHAGEAL ECHOCARDIOGRAM (TEE);  Surgeon: Lars Masson, MD;  Location: [facility] ENDOSCOPY;  Service: Cardiovascular;  Laterality: N/A;    Current Outpatient Prescriptions  Medication Sig Dispense Refill  . atorvastatin (LIPITOR) 10 MG tablet Take 10 mg by mouth daily.    . budesonide-formoterol (SYMBICORT) 80-4.5 MCG/ACT inhaler Inhale 2 puffs into the lungs 2 (two) times daily. 1 Inhaler 0  . diltiazem (TAZTIA XT) 180 MG 24 hr capsule Take 2 capsules (360 mg total) by mouth daily. 180 capsule 3  . doxazosin (CARDURA) 4 MG tablet Take 1 tablet (4 mg total) by mouth daily. 90 tablet 3  . furosemide (LASIX) 40 MG tablet TAKE 1 TABLET (40 MG TOTAL) BY MOUTH DAILY. 30 tablet 5  . Glycopyrrolate-Formoterol (BEVESPI AEROSPHERE) 9-4.8 MCG/ACT AERO Inhale 2 puffs into the lungs 2 (two) times daily. 1 Inhaler 0  . metoprolol succinate (TOPROL-XL) 100 MG 24 hr tablet Take 1 tablet (100 mg total) by mouth daily. Take with or immediately following a meal. 90 tablet  3  . pantoprazole (PROTONIX) 40 MG tablet TAKE 1 TABLET (40 MG TOTAL) BY MOUTH DAILY. 30 tablet 3  . Rivaroxaban (XARELTO) 15 MG TABS tablet Take 1 tablet (15 mg total) by mouth daily with supper. 30 tablet 11  . tamsulosin (FLOMAX) 0.4 MG CAPS capsule TAKE ONE CAPSULE BY MOUTH EVERY DAY 90 capsule 3  . VENTOLIN HFA 108 (90 Base) MCG/ACT inhaler INHALE 2 PUFFS INTO THE LUNGS EVERY 6 (SIX) HOURS AS NEEDED FOR WHEEZING OR SHORTNESS OF BREATH. 18 g 5   No current facility-administered medications for this visit.     Allergies  Allergen Reactions  . Niaspan [Niacin Er] Anaphylaxis    Social History   Social History  . Marital status: Married    Spouse name: Nicole Cella  . Number of children: 4  . Years of education: N/A   Occupational History  . Works PT as a Education administrator  Social History Main Topics  . Smoking status: Former Smoker    Packs/day: 1.00    Years: 56.00    Types: Cigarettes    Quit date: 07/31/2013  . Smokeless tobacco: Former Neurosurgeon    Types: Chew  . Alcohol use No  . Drug use: No  . Sexual activity: Not Currently   Other Topics Concern  . Not on file   Social History Narrative   Married.  Lives with wife.  Ambulates without assistance.    Family History  Problem Relation Age of Onset  . Diabetes Mother   . Tuberculosis Mother   . Heart disease Father   . Heart attack Father   . Heart disease Son     Blood clot:  Arm    Review of Systems:  As stated in the HPI and otherwise negative.   BP (!) 158/92   Pulse 71   Ht 5\' 7"  (1.702 m)   Wt 150 lb 9.6 oz (68.3 kg)   BMI 23.59 kg/m   Physical Examination: General: Well developed, well nourished, NAD  HEENT: OP clear, mucus membranes moist  SKIN: warm, dry. No rashes. Neuro: No focal deficits  Musculoskeletal: Muscle strength 5/5 all ext  Psychiatric: Mood and affect normal  Neck: No JVD, no carotid bruits, no thyromegaly, no lymphadenopathy.  Lungs:Clear bilaterally, no wheezes, rhonci,  crackles Cardiovascular: Irreg irreg with no murmurs, gallops or rubs. Abdomen:Soft. Bowel sounds present. Non-tender.  Extremities: Trace bilateral lower extremity edema. Pulses are 2 + in the bilateral DP/PT.  Echo 04/26/16: - Left ventricle: The cavity size was normal. Wall thickness was   normal. - Ventricular septum: The contour showed diastolic flattening and   systolic flattening. These changes are consistent with RV volume   and pressure overload. - Mitral valve: There was mild regurgitation. - Left atrium: The atrium was mildly dilated. - Right ventricle: The cavity size was moderately dilated. Systolic   function was mildly reduced. - Right atrium: The atrium was moderately dilated. - Atrial septum: No defect or patent foramen ovale was identified. - Tricuspid valve: There was moderate regurgitation. - Pulmonary arteries: Systolic pressure was moderately to severely   increased. PA peak pressure: 59 mm Hg (S).  Chest x-ray 05/08/16: FINDINGS: There is hyperinflation of the lungs compatible with COPD. Moderate bilateral pleural effusions, left slightly greater than right. Bibasilar atelectasis. Heart is borderline in size. Diffuse interstitial prominence, increasing since prior study. This may reflect interstitial edema.  IMPRESSION: Moderate bilateral pleural effusions with bibasilar atelectasis and diffuse interstitial prominence, possibly interstitial edema.  Nuclear stress test 04/26/16:  Nuclear stress EF: 54%.  There was no ST segment deviation noted during stress.  This is a low risk study.  The left ventricular ejection fraction is mildly decreased (45-54%).  No ischemia was identified.  EKG:  EKG is ordered today. The ekg ordered today demonstrates atrial flutter with variable block. 71 bpm.   Recent Labs: 04/20/2016: ALT 12 05/08/2016: Pro B Natriuretic peptide (BNP) 1,079.0; TSH 0.45 05/30/2016: BUN 33; Creat 2.59; Hemoglobin 12.4; Platelets 167;  Potassium 4.9; Sodium 138   Lipid Panel    Component Value Date/Time   CHOL 123 (L) 04/20/2016 0908   TRIG 113 04/20/2016 0908   HDL 44 04/20/2016 0908   CHOLHDL 2.8 04/20/2016 0908   VLDL 23 04/20/2016 0908   LDLCALC 56 04/20/2016 0908     Wt Readings from Last 3 Encounters:  06/28/16 150 lb 9.6 oz (68.3 kg)  06/05/16 142 lb (64.4  kg)  06/04/16 144 lb 1.9 oz (65.4 kg)     Other studies Reviewed: Additional studies/ records that were reviewed today include: . Review of the above records demonstrates:   Assessment and Plan:   1. Atrial flutter: He is in atrial flutter today. Cardioversion 06/04/16 restored sinus initially. Rate is controlled.  Will continue Toprol and Diltiazem. He has not tolerated amiodarone in the past due to pulmonary issues. I think it reasonable to assume that some of his dyspnea is from the atrial flutter but with good rate control, will not move toward anti-arrhyhtmic therapy. Will consider EP referral after next visit if he is still dyspneic with volume overload despite current strategy.    2. Acute on Chronic diastolic CHF: He is up 5 lbs over last week on his home scale. He has been on Lasix 40 mg daily. It has been difficult to titrate Lasix given his chronic kidney disease. I have spoken to Dr. Allena KatzPatel and the patient was recently seen in their office. I do not have records of this visit but he was told his renal disease is stable. He will take Lasix 40 mg po BID for next two days and follow weights closely at home. He will use extra lasix as needed.   3. HLD: Continue statin. Lipids followed in primary care. LDL is well controlled.   4. Dyspnea: His dyspnea is likely multifactorial with some degree of COPD, bilateral pleural effusions that may have occurred due to diastolic CHF/atrial flutter in setting of chronic kidney disease with inability to really push his Lasix. He is being followed closely in pulmonary and is now on nighttime supplemental O2 therapy.  Cannot fully exclude ischemic heart disease even though his stress test was ok. He smoked 100 pack years so CAD is likely. He is not a good cath candidate right now given his renal issues. He is having no chest pain. Will try to control his symptoms with volume control/Lasix as renal function tolerates. I will try to get Dr. Jill AlexandersPatels note from his office visit recently   Current medicines are reviewed at length with the patient today.  The patient does not have concerns regarding medicines.  The following changes have been made:  no change  Labs/ tests ordered today include:   Orders Placed This Encounter  Procedures  . EKG 12-Lead     Disposition:   FU with me in 3 weeks.    Signed, Verne Carrowhristopher McAlhany, MD 06/28/2016 12:31 PM    [facility] [address], [place], [place]  [address] Phone: [phone]; Fax: [phone]

## 2016-07-06 ENCOUNTER — Encounter: Payer: Self-pay | Admitting: Adult Health

## 2016-07-06 ENCOUNTER — Ambulatory Visit (INDEPENDENT_AMBULATORY_CARE_PROVIDER_SITE_OTHER): Payer: PPO | Admitting: Adult Health

## 2016-07-06 DIAGNOSIS — J449 Chronic obstructive pulmonary disease, unspecified: Secondary | ICD-10-CM | POA: Diagnosis not present

## 2016-07-06 DIAGNOSIS — J9611 Chronic respiratory failure with hypoxia: Secondary | ICD-10-CM | POA: Diagnosis not present

## 2016-07-06 NOTE — Assessment & Plan Note (Signed)
Continue on O2 w/ act and At bedtime

## 2016-07-06 NOTE — Addendum Note (Signed)
Addended by: Karalee HeightOX, Shilo Pauwels P on: 07/06/2016 02:50 PM   Modules accepted: Orders

## 2016-07-06 NOTE — Progress Notes (Signed)
Subjective:    Patient ID: Mark [name], male    DOB: 1945/11/17,   MRN: [id number]    Brief patient profile:  [id number] quit smoking 12/2012 with doe then that has variably  worsened since  so referred by Dr Tanya Nones to pulmonary clinic 01/21/15  History of Present Illness  01/21/2015 1st [facility] office visit/ Wert  On amiodarone/ symb/spiriva with min airflow obst on pfts 01/21/15  Chief Complaint  Patient presents with  . Pulmonary Consult    Referred by Dr. Lynnea Ferrier. Pt states he was dxed with COPD. He c/o SOB and cough for the past yr.  He gets SOB walking 200 ft on a flat suface. Cough is non prod most of the time.   onset of doe and dry cough indolent / progressive while on amiodarone for afib/  just started 02 at hs  rec Stop amiodarone today and spiriva for now  Continue symbicort Take 2 puffs first thing in am and then another 2 puffs about 12 hours later Only use your albuterol (ventolin)  as a rescue medication  pred 10 mg 2 daily with bast until improving and then 1 daily until return    02/22/2015 f/u ov/Wert re: ? amio ild/ gold III copd with reversibility  Chief Complaint  Patient presents with  . Follow-up    PFT done today. Pt states breathing is doing better. He states that he can walk twice as far without having to stop. Using albuterol 1 x per wk on average.    rec Stop metaprolol and take bisoprol 5 mg one daily  Work on inhaler technique:   Stay on symbicort 160  Take 2 puffs first thing in am and then another 2 puffs about 12 hours later.  Prednisone 10 mg one per day x one week then half a pill x one week and off     05/08/2016 acute extended  f/u ov/Wert re: GOLD III  Copd/ chf/ cri maint rx = symb 160 2bid Chief Complaint  Patient presents with  . Acute Visit    Pt c/o increased SOB for the past 2-3 months. He gets out of breath walking back from the mailbox and breathing is esp worse first thing in the am. He coughed up min amount of blood  this am. He has been using albuterol inhaler 2 x daily on average.   cough is not unusual pattern from baseline  New = sob Only with activity / not noct   Get to mb ok but stops half way up slt incline / not sure saba works as uses with am symbicort  rec Plan A = Automatic =  BEVESPI  Take 2 puffs first thing in am and then another 2 puffs about 12 hours later.  Work on inhaler technique:  Plan B = Backup Only use your albuterol (ventolin)   Please schedule a follow up office visit in 4 weeks, sooner if needed    06/05/2016  f/u ov/Wert re: GOLD II copd worse on bevespi  Chief Complaint  Patient presents with  . Follow-up    Pt concerned about wt loss. He has occ swelling in his ankles. He states his breathing is no better since the last visit.    Having to use more saba off symbicort and on bevespi with very poor hfa/ insight into meds/ problems Still sob mb to house s change     >>change back to Symbicort   07/06/2016 Follow up : COPD GOLD  III /? Amio ILD /CHF  Pt returns for a 1 month follow up . Says he feels his breathing some better. Oxygen levels have been better. Says running 90-93% RA at home . Takes mucinex on occasion which helps with mucus.  Leg swelling is down. On Lasix 40mg  daily .  Was changed back to Symbicort (from [place]) last ov , feels better on Symbicort.  Followed by cardiology for A Fib. . Had unsuccessful cardioversion last month .  Echo 04/2016 PAP 59, RA mod dilation.  He denies hemoptysis, chest pain, orthopnea, PND or increased leg swelling   Current Medications, Allergies, Complete Past Medical History, Past Surgical History, Family History, and Social History were reviewed in [employer] record.  ROS  The following are not active complaints unless bolded sore throat, dysphagia, dental problems, itching, sneezing,  nasal congestion or excess/ purulent secretions, ear ache,   fever, chills, sweats, unintended wt loss, pleuritic or  exertional cp, hemoptysis,  orthopnea pnd or leg swelling, presyncope, palpitations, heartburn, abdominal pain, anorexia, nausea, vomiting, diarrhea  or change in bowel or urinary habits, change in stools or urine, dysuria,hematuria,  rash, arthralgias, visual complaints, headache, numbness weakness or ataxia or problems with walking or coordination,  change in mood/affect or memory.            Objective:  Physical Exam  amb wm nad   06/05/2016        142  05/08/2016        147 02/22/15              152    01/21/15 144 lb 12.8 oz (65.681 kg)  01/07/15 146 lb (66.225 kg)  01/04/15 146 lb (66.225 kg)    Vital signs reviewed - 91% RA   Vitals:   07/06/16 1351  BP: 126/82  Pulse: (!) 58  Temp: 97.5 F (36.4 C)  TempSrc: Oral  SpO2: 91%  Weight: 148 lb (67.1 kg)  Height: 5\' 7"  (1.702 m)     HEENT: nl dentition, turbinates, and orophanx. Nl external ear canals without cough reflex   NECK :  without JVD/Nodes/TM/ nl carotid upstrokes bilaterally   LUNGS: no acc muscle use, subtle insp crackles both bases without cough on insp or exp maneuvers and dullness in bases L > R    CV:  IRIR  no s3 or murmur or increase in P2,    1+ bilateral sym  Lower ext  edema   ABD:  soft and nontender with nl excursion in the supine position. No bruits or organomegaly, bowel sounds nl  MS:  warm without deformities, calf tenderness, cyanosis or clubbing  SKIN: warm and dry without lesions    NEURO:  alert, approp, no deficits        CXR PA and Lateral:   05/08/2016 :      Moderate bilateral pleural effusions with bibasilar atelectasis and diffuse interstitial prominence, possibly interstitial edema.     Labs  reviewed:      Chemistry      Component Value Date/Time   NA 138 05/30/2016 1107   NA 135 09/13/2014 1553   K 4.9 05/30/2016 1107   CL 105 05/30/2016 1107   CO2 22 05/30/2016 1107   BUN 33 (H) 05/30/2016 1107   BUN 45 (H) 09/13/2014 1553   CREATININE 2.59 (H)  05/30/2016 1107      Component Value Date/Time   CALCIUM 9.3 05/30/2016 1107   ALKPHOS 77 04/20/2016 0908   AST  9 (L) 04/20/2016 0908   ALT 12 04/20/2016 0908   BILITOT 0.6 04/20/2016 0908        Lab Results  Component Value Date   WBC 5.9 05/30/2016   HGB 12.4 (L) 05/30/2016   HCT 37.2 (L) 05/30/2016   MCV 88.8 05/30/2016   PLT 167 05/30/2016        Lab Results  Component Value Date   TSH 0.45 05/08/2016     Lab Results  Component Value Date   PROBNP 1,079.0 (H) 05/08/2016       Lab Results  Component Value Date   ESRSEDRATE 28 (H) 05/08/2016   ESRSEDRATE 52 (H) 01/21/2015          Tammy Parrett NP-C  [facility]  07/06/2016

## 2016-07-06 NOTE — Progress Notes (Signed)
Chart and office note reviewed in detail  > agree with a/p as outlined    

## 2016-07-06 NOTE — Assessment & Plan Note (Signed)
Severe COPD -appears to be improved on Symbicort.  Patient's medications were reviewed today and patient education was given. Computerized medication calendar was adjusted/completed   Plan  Patient Instructions  Follow med calendar closely and bring to each visit. Continue on Symbicort 2 puffs twice daily, rinse after use. Wear oxygen with activity as needed and at bedtime Follow up Dr. Sherene SiresWert  In 3 months and As needed

## 2016-07-06 NOTE — Patient Instructions (Signed)
Follow med calendar closely and bring to each visit. Continue on Symbicort 2 puffs twice daily, rinse after use. Wear oxygen with activity as needed and at bedtime Follow up Dr. Sherene SiresWert  In 3 months and As needed

## 2016-07-09 ENCOUNTER — Telehealth: Payer: Self-pay | Admitting: Family Medicine

## 2016-07-09 NOTE — Telephone Encounter (Signed)
Patient is calling to say he needs referral for his health team advantage to [facility] appt tomorrow with amy esterwood

## 2016-07-10 ENCOUNTER — Ambulatory Visit (INDEPENDENT_AMBULATORY_CARE_PROVIDER_SITE_OTHER): Payer: PPO | Admitting: Physician Assistant

## 2016-07-10 ENCOUNTER — Telehealth: Payer: Self-pay | Admitting: *Deleted

## 2016-07-10 ENCOUNTER — Encounter: Payer: Self-pay | Admitting: Physician Assistant

## 2016-07-10 ENCOUNTER — Other Ambulatory Visit (INDEPENDENT_AMBULATORY_CARE_PROVIDER_SITE_OTHER): Payer: PPO

## 2016-07-10 VITALS — BP 160/80 | HR 68 | Ht 66.5 in | Wt 149.2 lb

## 2016-07-10 DIAGNOSIS — D649 Anemia, unspecified: Secondary | ICD-10-CM

## 2016-07-10 DIAGNOSIS — R195 Other fecal abnormalities: Secondary | ICD-10-CM

## 2016-07-10 LAB — FERRITIN: FERRITIN: 78.2 ng/mL (ref 22.0–322.0)

## 2016-07-10 LAB — CBC WITH DIFFERENTIAL/PLATELET
BASOS ABS: 0 10*3/uL (ref 0.0–0.1)
Basophils Relative: 0.2 % (ref 0.0–3.0)
Eosinophils Absolute: 0.1 10*3/uL (ref 0.0–0.7)
Eosinophils Relative: 1.1 % (ref 0.0–5.0)
HCT: 37 % — ABNORMAL LOW (ref 39.0–52.0)
Hemoglobin: 12.2 g/dL — ABNORMAL LOW (ref 13.0–17.0)
LYMPHS ABS: 1.8 10*3/uL (ref 0.7–4.0)
Lymphocytes Relative: 24.2 % (ref 12.0–46.0)
MCHC: 33 g/dL (ref 30.0–36.0)
MCV: 91.1 fl (ref 78.0–100.0)
MONO ABS: 0.5 10*3/uL (ref 0.1–1.0)
MONOS PCT: 6.3 % (ref 3.0–12.0)
NEUTROS ABS: 5 10*3/uL (ref 1.4–7.7)
NEUTROS PCT: 68.2 % (ref 43.0–77.0)
PLATELETS: 159 10*3/uL (ref 150.0–400.0)
RBC: 4.06 Mil/uL — AB (ref 4.22–5.81)
RDW: 16.9 % — ABNORMAL HIGH (ref 11.5–15.5)
WBC: 7.3 10*3/uL (ref 4.0–10.5)

## 2016-07-10 LAB — IBC PANEL
IRON: 54 ug/dL (ref 42–165)
SATURATION RATIOS: 17.6 % — AB (ref 20.0–50.0)
Transferrin: 219 mg/dL (ref 212.0–360.0)

## 2016-07-10 MED ORDER — NA SULFATE-K SULFATE-MG SULF 17.5-3.13-1.6 GM/177ML PO SOLN: 1.0000 | Freq: Once | ORAL | 0 refills | Status: AC

## 2016-07-10 NOTE — Telephone Encounter (Signed)
He can hold Xarelto 3 days before the planned procedure.   Mark CarrowChristopher Jonuel [name] 07/10/2016 10:36 AM

## 2016-07-10 NOTE — Patient Instructions (Signed)
Please go to the basement level to have your labs drawn.  You have been scheduled for an endoscopy and colonoscopy. Please follow the written instructions given to you at your visit today. Please pick up your prep supplies at the pharmacy within the next 1-3 days.  If you use inhalers (even only as needed), please bring them with you on the day of your procedure. Your physician has requested that you go to www.[email or web address].com and enter the access code given to you at your visit today. This web site gives a general overview about your procedure. However, you should still follow specific instructions given to you by our office regarding your preparation for the procedure. 

## 2016-07-10 NOTE — Telephone Encounter (Signed)
0/24/2017   RE: Hetty BlendHorace T Baldassari DOB: 1946/01/02 MRN: [id number]   Dear Dr. Verne Carrowhristopher McAlhany,    We have scheduled the above patient for an endoscopic procedure. Our records show that he is on anticoagulation therapy.   Please advise as to how long the patient may come off his therapy of Xarelto prior to the procedure, which is scheduled for 08-02-2016.  Please fax back/ or route the completed form to [facility] Janaisa Birkland CMA at [phone].   Sincerely,    Amy Esterwood PA-C

## 2016-07-10 NOTE — Progress Notes (Signed)
I agree with the above note, plan 

## 2016-07-10 NOTE — Progress Notes (Signed)
Subjective:    Patient ID: Mark [name], male    DOB: May 19, 1946, 70 y.o.   MRN: [id number]  HPI Mark [name] is a pleasant 70 year old white male referred today by Dr. Jenna [name] for evaluation of Hemoccult-positive stool.. Patient states that he did have one prior colonoscopy done in [place] or 15 years ago and thinks that he had 1 polyp. He does not remember who performed this exam.  Patient is on chronic Xarelto and has history of atrial flutter. He actually underwent a cardioversion on 06/04/2016 but reverted back to atrial flutter fairly quickly. He has history of COPD and is on 2 L of O2 at nighttime, congestive heart failure with EF of approximately 60%, hypertension and coronary artery disease. He status post abdominal aortic aneurysm repair about 5 years ago. Recent labs done 05/30/2016 hemoglobin 12.4 hematocrit of 37.2 on 05/08/2016 hemoglobin 12.8 hematocrit of 39 and April 2017 hemoglobin 12.1 hematocrit of 36. Patient himself has not seen any blood. He denies any problems with abdominal pain or cramping, appetite has been good and weight has been stable. No recent changes in his bowel habits and no complaints of diarrhea or constipation. His only GI complaint is gas.  Family history negative for colon cancer polyps as far as he is aware.  Review of Systems Pertinent positive and negative review of systems were noted in the above HPI section.  All other review of systems was otherwise negative.  Outpatient Encounter Prescriptions as of 07/10/2016  Medication Sig  . atorvastatin (LIPITOR) 10 MG tablet Take 10 mg by mouth at bedtime.   . budesonide-formoterol (SYMBICORT) 80-4.5 MCG/ACT inhaler Inhale 2 puffs into the lungs 2 (two) times daily.  Marland Kitchen dextromethorphan-guaiFENesin (MUCINEX DM) 30-600 MG 12hr tablet Take 1 tablet by mouth 2 (two) times daily as needed.   . diltiazem (TAZTIA XT) 180 MG 24 hr capsule Take 2 capsules (360 mg total) by mouth daily.  Marland Kitchen doxazosin  (CARDURA) 4 MG tablet Take 1 tablet (4 mg total) by mouth daily.  . furosemide (LASIX) 40 MG tablet TAKE 1 TABLET (40 MG TOTAL) BY MOUTH DAILY.  . metoprolol succinate (TOPROL-XL) 100 MG 24 hr tablet Take 1 tablet (100 mg total) by mouth daily. Take with or immediately following a meal.  . OXYGEN Use 2 L at bedtime and as needed for shortness of breath and activity  . ranitidine (ZANTAC) 150 MG capsule Take 150 mg by mouth daily as needed for heartburn.  . Rivaroxaban (XARELTO) 15 MG TABS tablet Take 1 tablet (15 mg total) by mouth daily with supper.  . Simethicone (GAS-X PO) Take by mouth. Use as needed for gas  . VENTOLIN HFA 108 (90 Base) MCG/ACT inhaler INHALE 2 PUFFS INTO THE LUNGS EVERY 6 (SIX) HOURS AS NEEDED FOR WHEEZING OR SHORTNESS OF BREATH. (Patient taking differently: INHALE 2 PUFFS INTO THE LUNGS EVERY 4 HOURS AS NEEDED FOR WHEEZING OR SHORTNESS OF BREATH Plan B)  . Na Sulfate-K Sulfate-Mg Sulf 17.5-3.13-1.6 GM/180ML SOLN Take 1 kit by mouth once.   No facility-administered encounter medications on file as of 07/10/2016.    Allergies  Allergen Reactions  . Niaspan [Niacin Er] Anaphylaxis   Patient Active Problem List   Diagnosis Date Noted  . Pleural effusion, bilateral  L > R  05/09/2016  . 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 Dyspnea 01/21/2015  . Acute exacerbation of CHF (congestive heart failure) ([place])   . SOB (shortness of breath)   . Bronchiectasis with acute  exacerbation ([place])   . Acute respiratory failure with hypoxia ([place])   . CHF exacerbation ([place]) 12/24/2014  . Congestive heart failure ([place]) 12/24/2014  . Acute renal failure superimposed on stage 4 chronic kidney disease ([place]) 09/13/2014  . CKD (chronic kidney disease), stage IV ([place]) 09/01/2014  . PAF (paroxysmal atrial fibrillation) ([place]) 09/01/2014  . Transaminitis 09/01/2014  . COPD with acute exacerbation ([place])   . Acute respiratory failure ([place]) 04/14/2014  . Aftercare following  surgery of the circulatory system, [place] 12/29/2013  . Chronic diastolic heart failure ([place]) 10/06/2013  . Protein calorie malnutrition ([place]) 10/06/2013  . COPD GOLD II with marked reversibility  09/19/2013  . Acute diastolic heart failure ([place]) 09/19/2013  . Chronic respiratory failure with hypoxia ([place]) 09/19/2013  . Acute on chronic diastolic CHF (congestive heart failure) ([place]) 09/08/2013  . Acute-on-chronic respiratory failure ([place]) 09/08/2013  . Long term (current) use of anticoagulants 08/19/2013  . Hyperkalemia 08/14/2013  . CAP (community acquired pneumonia) 08/11/2013  . Tobacco abuse 08/11/2013  . HTN (hypertension) 01/07/2013  . HLD (hyperlipidemia) 01/07/2013  . AAA (abdominal aortic aneurysm) ([place]) 10/13/2012  . Atherosclerosis of native arteries of the extremities with intermittent claudication 10/07/2012   Social History   Social History  . Marital status: Married    Spouse name: Mark [name]  . Number of children: 4  . Years of education: N/A   Occupational History  . Works PT as a Curator    Social History Main Topics  . Smoking status: Former Smoker    Packs/day: 1.00    Years: 56.00    Types: Cigarettes    Quit date: 07/31/2013  . Smokeless tobacco: Former Systems developer    Types: Chew  . Alcohol use No  . Drug use: No  . Sexual activity: Not Currently   Other Topics Concern  . Not on file   Social History Narrative   Married.  Lives with wife.  Ambulates without assistance.    [name] family history includes Diabetes in his mother; Heart attack in his father; Heart disease in his father and son; Tuberculosis in his mother.      Objective:    Vitals:   07/10/16 0852  BP: (!) 160/80  Pulse: 68    Physical Exam  well-developed older white male in no acute distress, accompanied by his wife blood pressure 160/80 pulse 68 BMI 23.7. HEENT; nontraumatic, cephalic EOMI PERRLA sclera anicteric, Cardiovascular; regular rate and rhythm with S1-S2, Pulmonary  ;decreased breath sounds bilaterally, Abdomen ;soft he has a long midline incisional scar, there is some firmness and tenderness at the superior and of the incisional scar no palpable mass or definite hepatosplenomegaly bowel sounds present, Rectal; exam not done, Extremities ;no clubbing cyanosis or edema skin warm and dry, Neuropsych; mood and affect appropriate       Assessment & Plan:   #67 70 year old white male with Hemoccult-positive stool and mild normocytic anemia-rule out occult lower versus upper GI lesion #2 atrial flutter-status  post cardioversion 9 /2017 with reversion to atrial flutter #3 chronic anticoagulation- on Xarelto #4 s/p abdominal aortic aneurysm repair # 5 Congestive heart failure- EF 60% #6 COPD on O2 nocturnally #7 coronary artery disease next line of her 8 hypertension #8 chronic kidney disease  Plan; Patient will be scheduled for colonoscopy and EGD with Dr. Ardis Hughs at [facility] given nighttime oxygen requirement. Procedure discussed in detail with patient and his wife and they're agreeable to proceed. We will communicate with his cardiologist  Dr. Oren Bracket  to assure  that stopping Xarelto for 24 hours prior to procedures is reasonable for this patient. Check hemoglobin and iron studies today   Alfredia Ferguson PA-C 07/10/2016   Cc: Susy Frizzle, MD

## 2016-07-11 NOTE — Telephone Encounter (Signed)
Called the patient and gave him the Xarelto directions for his procedure on 11-16.  He can stop the Xarelto on 11-13 and resume again on 11-17 unless he is told otherwise by Dr. Rob Buntinganiel Jacobs.

## 2016-07-12 NOTE — Telephone Encounter (Signed)
Submitted [facility] referral for GI visit.  Case # [id number].  Today rec'd mess from [facility] stating no referral is needed.  Message # [phone] stating [facility] pt's do not require a referral.

## 2016-07-16 ENCOUNTER — Telehealth: Payer: Self-pay | Admitting: Physician Assistant

## 2016-07-17 ENCOUNTER — Emergency Department (HOSPITAL_COMMUNITY): Payer: PPO

## 2016-07-17 ENCOUNTER — Inpatient Hospital Stay (HOSPITAL_COMMUNITY)
Admission: EM | Admit: 2016-07-17 | Discharge: 2016-07-23 | DRG: 190 | Disposition: A | Discharge status: Home / Self Care | Payer: PPO | Attending: Family Medicine | Admitting: Family Medicine

## 2016-07-17 ENCOUNTER — Encounter (HOSPITAL_COMMUNITY): Payer: Self-pay

## 2016-07-17 DIAGNOSIS — I130 Hypertensive heart and chronic kidney disease with heart failure and stage 1 through stage 4 chronic kidney disease, or unspecified chronic kidney disease: Secondary | ICD-10-CM | POA: Diagnosis present

## 2016-07-17 DIAGNOSIS — I484 Atypical atrial flutter: Secondary | ICD-10-CM | POA: Diagnosis present

## 2016-07-17 DIAGNOSIS — Z888 Allergy status to other drugs, medicaments and biological substances status: Secondary | ICD-10-CM

## 2016-07-17 DIAGNOSIS — J441 Chronic obstructive pulmonary disease with (acute) exacerbation: Secondary | ICD-10-CM | POA: Diagnosis not present

## 2016-07-17 DIAGNOSIS — J9621 Acute and chronic respiratory failure with hypoxia: Secondary | ICD-10-CM | POA: Diagnosis not present

## 2016-07-17 DIAGNOSIS — I10 Essential (primary) hypertension: Secondary | ICD-10-CM | POA: Diagnosis present

## 2016-07-17 DIAGNOSIS — Z66 Do not resuscitate: Secondary | ICD-10-CM

## 2016-07-17 DIAGNOSIS — E875 Hyperkalemia: Secondary | ICD-10-CM | POA: Diagnosis present

## 2016-07-17 DIAGNOSIS — N179 Acute kidney failure, unspecified: Secondary | ICD-10-CM | POA: Diagnosis present

## 2016-07-17 DIAGNOSIS — I2721 Secondary pulmonary arterial hypertension: Secondary | ICD-10-CM

## 2016-07-17 DIAGNOSIS — E1122 Type 2 diabetes mellitus with diabetic chronic kidney disease: Secondary | ICD-10-CM | POA: Diagnosis present

## 2016-07-17 DIAGNOSIS — I5082 Biventricular heart failure: Secondary | ICD-10-CM | POA: Diagnosis present

## 2016-07-17 DIAGNOSIS — Z9981 Dependence on supplemental oxygen: Secondary | ICD-10-CM

## 2016-07-17 DIAGNOSIS — I480 Paroxysmal atrial fibrillation: Secondary | ICD-10-CM | POA: Diagnosis present

## 2016-07-17 DIAGNOSIS — I5033 Acute on chronic diastolic (congestive) heart failure: Secondary | ICD-10-CM | POA: Diagnosis not present

## 2016-07-17 DIAGNOSIS — R0602 Shortness of breath: Secondary | ICD-10-CM | POA: Diagnosis not present

## 2016-07-17 DIAGNOSIS — I2781 Cor pulmonale (chronic): Secondary | ICD-10-CM | POA: Diagnosis present

## 2016-07-17 DIAGNOSIS — I50811 Acute right heart failure: Secondary | ICD-10-CM | POA: Diagnosis present

## 2016-07-17 DIAGNOSIS — J90 Pleural effusion, not elsewhere classified: Secondary | ICD-10-CM | POA: Diagnosis present

## 2016-07-17 DIAGNOSIS — K219 Gastro-esophageal reflux disease without esophagitis: Secondary | ICD-10-CM | POA: Diagnosis present

## 2016-07-17 DIAGNOSIS — M1990 Unspecified osteoarthritis, unspecified site: Secondary | ICD-10-CM | POA: Diagnosis present

## 2016-07-17 DIAGNOSIS — E872 Acidosis, unspecified: Secondary | ICD-10-CM | POA: Diagnosis present

## 2016-07-17 DIAGNOSIS — Z87891 Personal history of nicotine dependence: Secondary | ICD-10-CM

## 2016-07-17 DIAGNOSIS — Z8679 Personal history of other diseases of the circulatory system: Secondary | ICD-10-CM

## 2016-07-17 DIAGNOSIS — Z515 Encounter for palliative care: Secondary | ICD-10-CM

## 2016-07-17 DIAGNOSIS — J471 Bronchiectasis with (acute) exacerbation: Secondary | ICD-10-CM | POA: Diagnosis present

## 2016-07-17 DIAGNOSIS — Z7901 Long term (current) use of anticoagulants: Secondary | ICD-10-CM

## 2016-07-17 DIAGNOSIS — E785 Hyperlipidemia, unspecified: Secondary | ICD-10-CM | POA: Diagnosis present

## 2016-07-17 DIAGNOSIS — R188 Other ascites: Secondary | ICD-10-CM

## 2016-07-17 DIAGNOSIS — J449 Chronic obstructive pulmonary disease, unspecified: Secondary | ICD-10-CM | POA: Diagnosis present

## 2016-07-17 DIAGNOSIS — I509 Heart failure, unspecified: Secondary | ICD-10-CM | POA: Diagnosis present

## 2016-07-17 DIAGNOSIS — D631 Anemia in chronic kidney disease: Secondary | ICD-10-CM | POA: Diagnosis present

## 2016-07-17 DIAGNOSIS — I5031 Acute diastolic (congestive) heart failure: Secondary | ICD-10-CM | POA: Diagnosis present

## 2016-07-17 DIAGNOSIS — R0902 Hypoxemia: Secondary | ICD-10-CM

## 2016-07-17 DIAGNOSIS — N185 Chronic kidney disease, stage 5: Secondary | ICD-10-CM

## 2016-07-17 DIAGNOSIS — R0600 Dyspnea, unspecified: Secondary | ICD-10-CM

## 2016-07-17 DIAGNOSIS — N184 Chronic kidney disease, stage 4 (severe): Secondary | ICD-10-CM | POA: Diagnosis present

## 2016-07-17 HISTORY — DX: Dependence on supplemental oxygen: Z99.81

## 2016-07-17 LAB — BASIC METABOLIC PANEL
ANION GAP: 9 (ref 5–15)
BUN: 44 mg/dL — AB (ref 6–20)
CALCIUM: 9 mg/dL (ref 8.9–10.3)
CO2: 23 mmol/L (ref 22–32)
Chloride: 106 mmol/L (ref 101–111)
Creatinine, Ser: 2.98 mg/dL — ABNORMAL HIGH (ref 0.61–1.24)
GFR calc Af Amer: 23 mL/min — ABNORMAL LOW (ref >60)
GFR, EST NON AFRICAN AMERICAN: 20 mL/min — AB (ref >60)
Glucose, Bld: 176 mg/dL — ABNORMAL HIGH (ref 65–99)
POTASSIUM: 4.7 mmol/L (ref 3.5–5.1)
SODIUM: 138 mmol/L (ref 135–145)

## 2016-07-17 LAB — I-STAT ARTERIAL BLOOD GAS, ED
Acid-base deficit: 4 mmol/L — ABNORMAL HIGH (ref 0.0–2.0)
Bicarbonate: 23.1 mmol/L (ref 20.0–28.0)
O2 SAT: 94 %
PCO2 ART: 47.3 mmHg (ref 32.0–48.0)
PH ART: 7.297 — AB (ref 7.350–7.450)
PO2 ART: 81 mmHg — AB (ref 83.0–108.0)
TCO2: 25 mmol/L (ref 0–100)

## 2016-07-17 LAB — CBC
HEMATOCRIT: 37.2 % — AB (ref 39.0–52.0)
Hemoglobin: 11.8 g/dL — ABNORMAL LOW (ref 13.0–17.0)
MCH: 29.6 pg (ref 26.0–34.0)
MCHC: 31.7 g/dL (ref 30.0–36.0)
MCV: 93.2 fL (ref 78.0–100.0)
Platelets: 128 10*3/uL — ABNORMAL LOW (ref 150–400)
RBC: 3.99 MIL/uL — ABNORMAL LOW (ref 4.22–5.81)
RDW: 16 % — AB (ref 11.5–15.5)
WBC: 6.1 10*3/uL (ref 4.0–10.5)

## 2016-07-17 LAB — BRAIN NATRIURETIC PEPTIDE: B NATRIURETIC PEPTIDE 5: 915 pg/mL — AB (ref 0.0–100.0)

## 2016-07-17 LAB — MAGNESIUM: Magnesium: 2.2 mg/dL (ref 1.7–2.4)

## 2016-07-17 LAB — PROCALCITONIN

## 2016-07-17 MED ORDER — ACETAMINOPHEN 325 MG PO TABS: 650.0000 mg | ORAL_TABLET | ORAL | Status: DC | PRN

## 2016-07-17 MED ORDER — DEXTROSE 5 % IV SOLN
500.0000 mg | Freq: Once | INTRAVENOUS | Status: AC
  Administered 2016-07-17: 500 mg via INTRAVENOUS
  Filled 2016-07-17: qty 500

## 2016-07-17 MED ORDER — DM-GUAIFENESIN ER 30-600 MG PO TB12: 1.0000 | ORAL_TABLET | Freq: Two times a day (BID) | ORAL | Status: DC | PRN

## 2016-07-17 MED ORDER — ATORVASTATIN CALCIUM 10 MG PO TABS
10.0000 mg | ORAL_TABLET | Freq: Every day | ORAL | Status: DC
  Administered 2016-07-18 – 2016-07-22 (×5): 10 mg via ORAL
  Filled 2016-07-17 (×6): qty 1

## 2016-07-17 MED ORDER — METHYLPREDNISOLONE SODIUM SUCC 125 MG IJ SOLR
125.0000 mg | Freq: Once | INTRAMUSCULAR | Status: AC
  Administered 2016-07-17: 125 mg via INTRAVENOUS
  Filled 2016-07-17: qty 2

## 2016-07-17 MED ORDER — ALBUTEROL (5 MG/ML) CONTINUOUS INHALATION SOLN
15.0000 mg/h | INHALATION_SOLUTION | Freq: Once | RESPIRATORY_TRACT | Status: AC
  Administered 2016-07-17: 15 mg/h via RESPIRATORY_TRACT
  Filled 2016-07-17: qty 20

## 2016-07-17 MED ORDER — SODIUM CHLORIDE 0.9 % IV SOLN: 250.0000 mL | INTRAVENOUS | Status: DC | PRN

## 2016-07-17 MED ORDER — FUROSEMIDE 10 MG/ML IJ SOLN: 60.0000 mg | Freq: Two times a day (BID) | INTRAMUSCULAR | Status: DC

## 2016-07-17 MED ORDER — SODIUM CHLORIDE 0.9% FLUSH
3.0000 mL | Freq: Two times a day (BID) | INTRAVENOUS | Status: DC
  Administered 2016-07-17 – 2016-07-23 (×11): 3 mL via INTRAVENOUS

## 2016-07-17 MED ORDER — METHYLPREDNISOLONE SODIUM SUCC 125 MG IJ SOLR
60.0000 mg | Freq: Two times a day (BID) | INTRAMUSCULAR | Status: DC
  Administered 2016-07-18 – 2016-07-19 (×3): 60 mg via INTRAVENOUS
  Filled 2016-07-17 (×3): qty 2

## 2016-07-17 MED ORDER — MOMETASONE FURO-FORMOTEROL FUM 100-5 MCG/ACT IN AERO
2.0000 | INHALATION_SPRAY | Freq: Two times a day (BID) | RESPIRATORY_TRACT | Status: DC
  Administered 2016-07-18 – 2016-07-23 (×10): 2 via RESPIRATORY_TRACT
  Filled 2016-07-17: qty 8.8

## 2016-07-17 MED ORDER — RIVAROXABAN 15 MG PO TABS
15.0000 mg | ORAL_TABLET | Freq: Every day | ORAL | Status: DC
  Administered 2016-07-17 – 2016-07-22 (×6): 15 mg via ORAL
  Filled 2016-07-17 (×6): qty 1

## 2016-07-17 MED ORDER — LEVALBUTEROL HCL 0.63 MG/3ML IN NEBU
0.6300 mg | INHALATION_SOLUTION | Freq: Three times a day (TID) | RESPIRATORY_TRACT | Status: DC
  Administered 2016-07-17 – 2016-07-20 (×8): 0.63 mg via RESPIRATORY_TRACT
  Filled 2016-07-17 (×10): qty 3

## 2016-07-17 MED ORDER — FUROSEMIDE 10 MG/ML IJ SOLN
60.0000 mg | Freq: Two times a day (BID) | INTRAMUSCULAR | Status: DC
  Administered 2016-07-17 – 2016-07-18 (×2): 60 mg via INTRAVENOUS
  Filled 2016-07-17 (×2): qty 6

## 2016-07-17 MED ORDER — DILTIAZEM HCL ER COATED BEADS 120 MG PO CP24
360.0000 mg | ORAL_CAPSULE | Freq: Every day | ORAL | Status: DC
  Administered 2016-07-18: 360 mg via ORAL
  Filled 2016-07-17 (×2): qty 1

## 2016-07-17 MED ORDER — IPRATROPIUM BROMIDE 0.02 % IN SOLN
1.0000 mg | Freq: Once | RESPIRATORY_TRACT | Status: AC
  Administered 2016-07-17: 1 mg via RESPIRATORY_TRACT
  Filled 2016-07-17: qty 5

## 2016-07-17 MED ORDER — FUROSEMIDE 10 MG/ML IJ SOLN
60.0000 mg | Freq: Once | INTRAMUSCULAR | Status: AC
  Administered 2016-07-17: 60 mg via INTRAVENOUS
  Filled 2016-07-17: qty 6

## 2016-07-17 MED ORDER — DOXAZOSIN MESYLATE 8 MG PO TABS
4.0000 mg | ORAL_TABLET | Freq: Every day | ORAL | Status: DC
  Administered 2016-07-18 – 2016-07-23 (×6): 4 mg via ORAL
  Filled 2016-07-17 (×6): qty 1

## 2016-07-17 MED ORDER — METOPROLOL SUCCINATE ER 100 MG PO TB24
100.0000 mg | ORAL_TABLET | Freq: Every day | ORAL | Status: DC
  Administered 2016-07-18 – 2016-07-21 (×4): 100 mg via ORAL
  Filled 2016-07-17 (×4): qty 1

## 2016-07-17 MED ORDER — SODIUM CHLORIDE 0.9% FLUSH: 3.0000 mL | INTRAVENOUS | Status: DC | PRN

## 2016-07-17 MED ORDER — ONDANSETRON HCL 4 MG/2ML IJ SOLN: 4.0000 mg | Freq: Four times a day (QID) | INTRAMUSCULAR | Status: DC | PRN

## 2016-07-17 MED ORDER — FAMOTIDINE 20 MG PO TABS
20.0000 mg | ORAL_TABLET | Freq: Every day | ORAL | Status: DC
  Administered 2016-07-18 – 2016-07-23 (×6): 20 mg via ORAL
  Filled 2016-07-17 (×6): qty 1

## 2016-07-17 NOTE — Progress Notes (Signed)
Xopenex neb given per RT

## 2016-07-17 NOTE — ED Notes (Signed)
Attempted IV x 1-- difficult stick- unsuccessful. Pt has fragile skin and bruises easily = on blood thinners. Will order IV team.

## 2016-07-17 NOTE — ED Notes (Signed)
Attempted report x1. 

## 2016-07-17 NOTE — ED Notes (Signed)
Ordered dinner tray - ty

## 2016-07-17 NOTE — ED Triage Notes (Signed)
Pt reports shortness of breath X2 days. He reports he usually wears 2L nasal cannula and has increased to 4L due to SOB. He reports without the O2 his oxygen drops. Pt denies chest pain. Pt is 100% on 4L.

## 2016-07-17 NOTE — ED Provider Notes (Signed)
[facility] DEPT Provider Note   CSN: [id number] Arrival date & time: 07/17/16  1234     History   Chief Complaint Chief Complaint  Patient presents with  . Shortness of Breath    HPI Mark [name] is a 70 y.o. male.   Shortness of Breath  This is a recurrent problem. The average episode lasts 6 days. The problem occurs continuously.The problem has been gradually worsening. Associated symptoms include cough, sputum production and leg swelling (improved though). Pertinent negatives include no fever, no ear pain, no neck pain and no syncope. He has tried beta-agonist inhalers for the symptoms. He has had prior hospitalizations. He has had prior ED visits. He has had no prior ICU admissions. Associated medical issues include COPD and CAD.    Past Medical History:  Diagnosis Date  . AAA (abdominal aortic aneurysm) (HCC)    a. 09/2013: Resection and grafting of abdominal aortic aneurysm with insertion of an aorto by common iliac graft using 18 x 9 mm Hemashield Dacron graft.  . Arthritis    "little in my hands" (09/08/2013)  . Chronic diastolic CHF (congestive heart failure) (HCC)    a.  Echo (08/13/2013): EF 55-60%, normal wall motion, mild MR, mild LAE  . Chronic kidney disease (CKD), stage III (moderate)   . COPD (chronic obstructive pulmonary disease) (HCC)   . 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 GERD (gastroesophageal reflux disease)   . H/O hiatal hernia   . Hx of cardiovascular stress test    a. Myoview 09/2012:  no scar or ischemia  . Hyperlipidemia   . Hypertension    Sees Dr. Lynnea FerrierWarren Pickard  . PAF (paroxysmal atrial fibrillation) (HCC)    a. amiodarone Rx started 08/2013;  b. s/p TEE-DCCV 09/2013 => recurrent AFib => repeat DCCV => NSR;   c. Xarelto 15 QD due to CKD  . Pneumonia   . Pulmonary hypertension associated with unclear multi-factorial mechanisms    Only on echo (8/17).      Patient Active Problem List   Diagnosis Date Noted  .  Pleural effusion, bilateral  L > R  05/09/2016  . 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 Dyspnea 01/21/2015  . Acute exacerbation of CHF (congestive heart failure) (HCC)   . SOB (shortness of breath)   . Bronchiectasis with acute exacerbation (HCC)   . Acute respiratory failure with hypoxia (HCC)   . CHF exacerbation (HCC) 12/24/2014  . Congestive heart failure (HCC) 12/24/2014  . Acute renal failure superimposed on stage 4 chronic kidney disease (HCC) 09/13/2014  . CKD (chronic kidney disease), stage IV (HCC) 09/01/2014  . PAF (paroxysmal atrial fibrillation) (HCC) 09/01/2014  . Transaminitis 09/01/2014  . COPD with acute exacerbation (HCC)   . Acute respiratory failure (HCC) 04/14/2014  . Aftercare following surgery of the circulatory system, NEC 12/29/2013  . Chronic diastolic heart failure (HCC) 10/06/2013  . Protein calorie malnutrition (HCC) 10/06/2013  . COPD GOLD II with marked reversibility  09/19/2013  . Acute diastolic heart failure (HCC) 09/19/2013  . Chronic respiratory failure with hypoxia (HCC) 09/19/2013  . Acute on chronic diastolic CHF (congestive heart failure) (HCC) 09/08/2013  . Acute-on-chronic respiratory failure (HCC) 09/08/2013  . Long term (current) use of anticoagulants 08/19/2013  . Hyperkalemia 08/14/2013  . CAP (community acquired pneumonia) 08/11/2013  . Tobacco abuse 08/11/2013  . HTN (hypertension) 01/07/2013  . HLD (hyperlipidemia) 01/07/2013  . AAA (abdominal aortic aneurysm) (HCC) 10/13/2012  . Atherosclerosis of  native arteries of the extremities with intermittent claudication 10/07/2012    Past Surgical History:  Procedure Laterality Date  . ABDOMINAL AORTIC ANEURYSM REPAIR  10/15/2012   Procedure: ANEURYSM ABDOMINAL AORTIC REPAIR;  Surgeon: Pryor OchoaJames D Lawson, MD;  [facility];  Service: Vascular;  Laterality: N/A;  Resection and Grafting of Abdominal Aortic Aneurysm - Aortobi-Iliac   . ABDOMINAL AORTIC ANEURYSM REPAIR   10-15-2012  . CARDIOVERSION N/A 09/22/2013   Procedure: CARDIOVERSION;  Surgeon: Lars MassonKatarina H Nelson, MD;  [facility];  Service: Cardiovascular;  Laterality: N/A;  . CARDIOVERSION  Jan. 12, 2015   Dr. Doylene CanningGregg W. Ladona Ridgelaylor  . CARDIOVERSION N/A 09/28/2013   Procedure: CARDIOVERSION;  Surgeon: Marinus MawGregg W Taylor, MD;  [facility];  Service: Cardiovascular;  Laterality: N/A;  . CARDIOVERSION N/A 06/04/2016   Procedure: CARDIOVERSION;  Surgeon: Lars MassonKatarina H Nelson, MD;  [facility];  Service: Cardiovascular;  Laterality: N/A;  . INGUINAL HERNIA REPAIR Right 12/22/2014   Procedure: OPEN RIGHT INGUINAL HERNIA REPAIR WITH MESH;  Surgeon: Axel FillerArmando Ramirez, MD;  Location: [facility] ORS;  Service: General;  Laterality: Right;  . INSERTION OF MESH N/A 12/22/2014   Procedure: INSERTION OF MESH;  Surgeon: Axel FillerArmando Ramirez, MD;  Location: [facility] ORS;  Service: General;  Laterality: N/A;  . KNEE ARTHROSCOPY Right   . TEE WITHOUT CARDIOVERSION N/A 09/22/2013   Procedure: TRANSESOPHAGEAL ECHOCARDIOGRAM (TEE);  Surgeon: Lars MassonKatarina H Nelson, MD;  [facility];  Service: Cardiovascular;  Laterality: N/A;       Home Medications    Prior to Admission medications   Medication Sig Start Date End Date Taking? Authorizing Provider  atorvastatin (LIPITOR) 10 MG tablet Take 10 mg by mouth at bedtime.    Yes Historical Provider, MD  budesonide-formoterol (SYMBICORT) 80-4.5 MCG/ACT inhaler Inhale 2 puffs into the lungs 2 (two) times daily. 06/05/16  Yes Nyoka CowdenMichael B Wert, MD  dextromethorphan-guaiFENesin [facility](MUCINEX DM) 30-600 MG 12hr tablet Take 1 tablet by mouth 2 (two) times daily as needed.    Yes Historical Provider, MD  diltiazem (TAZTIA XT) 180 MG 24 hr capsule Take 2 capsules (360 mg total) by mouth daily. 05/30/16  Yes Donita BrooksWarren T Pickard, MD  doxazosin (CARDURA) 4 MG tablet Take 1 tablet (4 mg total) by mouth daily. 05/30/16  Yes Donita BrooksWarren T Pickard, MD  furosemide (LASIX) 40 MG tablet TAKE 1 TABLET (40 MG TOTAL) BY  MOUTH DAILY. 03/21/16  Yes Donita BrooksWarren T Pickard, MD  metoprolol succinate (TOPROL-XL) 100 MG 24 hr tablet Take 1 tablet (100 mg total) by mouth daily. Take with or immediately following a meal. 04/06/16  Yes Donita BrooksWarren T Pickard, MD  OXYGEN Use 2 L at bedtime and as needed for shortness of breath and activity   Yes Historical Provider, MD  ranitidine (ZANTAC) 150 MG capsule Take 150 mg by mouth daily as needed for heartburn.   Yes Historical Provider, MD  Rivaroxaban (XARELTO) 15 MG TABS tablet Take 1 tablet (15 mg total) by mouth daily with supper. 01/10/16  Yes Donita BrooksWarren T Pickard, MD  VENTOLIN HFA 108 (90 Base) MCG/ACT inhaler INHALE 2 PUFFS INTO THE LUNGS EVERY 6 (SIX) HOURS AS NEEDED FOR WHEEZING OR SHORTNESS OF BREATH. Patient taking differently: INHALE 2 PUFFS INTO THE LUNGS EVERY 4 HOURS AS NEEDED FOR WHEEZING OR SHORTNESS OF BREATH Plan B 11/29/15  Yes Donita BrooksWarren T Pickard, MD  Simethicone (GAS-X PO) Take by mouth. Use as needed for gas    Historical Provider, MD    Family History Family History  Problem Relation Age of Onset  . Diabetes Mother   . Tuberculosis Mother   . Heart disease Father   . Heart attack Father   . Heart disease Son     Blood clot:  Arm    Social History Social History  Substance Use Topics  . Smoking status: Former Smoker    Packs/day: 1.00    Years: 56.00    Types: Cigarettes    Quit date: 07/31/2013  . Smokeless tobacco: Former Neurosurgeon    Types: Chew  . Alcohol use No     Allergies   Niaspan [niacin er]   Review of Systems Review of Systems  Constitutional: Negative for fever.  HENT: Negative for ear pain.   Respiratory: Positive for cough, sputum production and shortness of breath.   Cardiovascular: Positive for leg swelling (improved though). Negative for syncope.  Musculoskeletal: Negative for neck pain.  All other systems reviewed and are negative.    Physical Exam Updated Vital Signs BP 167/94 (BP Location: Right Arm)   Pulse (!) 57   Temp 97.6 F  (36.4 C) (Oral)   Resp 21   Ht 5' 6.5" (1.689 m)   Wt 149 lb (67.6 kg)   SpO2 97%   BMI 23.69 kg/m   Physical Exam  Constitutional: He is oriented to person, place, and time. He appears well-developed and well-nourished. He appears distressed (mild respiratory).  HENT:  Head: Normocephalic and atraumatic.  Eyes: Conjunctivae and EOM are normal. Left eye exhibits no discharge.  Neck: Normal range of motion.  Cardiovascular: Normal rate.   Pulmonary/Chest: Accessory muscle usage present. Tachypnea noted. He is in respiratory distress. He has decreased breath sounds. He has wheezes. He has rales.  Abdominal: He exhibits no distension.  Musculoskeletal: Normal range of motion. He exhibits edema (mild non pitting).  Neurological: He is alert and oriented to person, place, and time.  Skin: Skin is warm and dry.  Nursing note and vitals reviewed.    ED Treatments / Results  Labs (all labs ordered are listed, but only abnormal results are displayed) Labs Reviewed  BASIC METABOLIC PANEL - Abnormal; Notable for the following:       Result Value   Glucose, Bld 176 (*)    BUN 44 (*)    Creatinine, Ser 2.98 (*)    GFR calc non Af Amer 20 (*)    GFR calc Af Amer 23 (*)    All other components within normal limits  CBC - Abnormal; Notable for the following:    RBC 3.99 (*)    Hemoglobin 11.8 (*)    HCT 37.2 (*)    RDW 16.0 (*)    Platelets 128 (*)    All other components within normal limits  I-STAT ARTERIAL BLOOD GAS, ED - Abnormal; Notable for the following:    pH, Arterial 7.297 (*)    pO2, Arterial 81.0 (*)    Acid-base deficit 4.0 (*)    All other components within normal limits  BLOOD GAS, ARTERIAL  BRAIN NATRIURETIC PEPTIDE    EKG  EKG Interpretation  Date/Time:  Tuesday July 17 2016 12:40:38 EDT Ventricular Rate:  63 PR Interval:    QRS Duration: 86 QT Interval:  426 QTC Calculation: 435 R Axis:   50 Text Interpretation:  Atrial flutter with variable A-V  block Abnormal ECG Aflutter new Confirmed by [facility] MD, Barbara Cower [phone]) on 07/17/2016 3:08:48 PM       Radiology Dg Chest 2 View  Result Date: 07/17/2016  CLINICAL DATA:  Shortness of breath. EXAM: CHEST  2 VIEW COMPARISON:  Radiographs of May 08, 2016. FINDINGS: Stable cardiomediastinal silhouette. Atherosclerosis of thoracic aorta is noted. Mild bibasilar edema or atelectasis is noted with mild bilateral pleural effusions. No pneumothorax is noted. Bony thorax is unremarkable. IMPRESSION: Mild bibasilar edema or atelectasis is noted with mild bilateral pleural effusions. Electronically Signed   By: Lupita Raider, M.D.   On: 07/17/2016 15:23    Procedures Procedures (including critical care time)  Angiocath insertion Performed by: Marily Memos  Consent: Verbal consent obtained. Risks and benefits: risks, benefits and alternatives were discussed Time out: Immediately prior to procedure a "time out" was called to verify the correct patient, procedure, equipment, support staff and site/side marked as required.  Preparation: Patient was prepped and draped in the usual sterile fashion.  Vein Location: right basilic  Ultrasound Guided  Gauge: 20  Normal blood return and flush without difficulty Patient tolerance: Patient tolerated the procedure well with no immediate complications.  CRITICAL CARE Performed by: Marily Memos Total critical care time: 35 minutes Critical care time was exclusive of separately billable procedures and treating other patients. Critical care was necessary to treat or prevent imminent or life-threatening deterioration. Critical care was time spent personally by me on the following activities: development of treatment plan with patient and/or surrogate as well as nursing, discussions with consultants, evaluation of patient's response to treatment, examination of patient, obtaining history from patient or surrogate, ordering and performing treatments and  interventions, ordering and review of laboratory studies, ordering and review of radiographic studies, pulse oximetry and re-evaluation of patient's condition.    Medications Ordered in ED Medications  azithromycin (ZITHROMAX) 500 mg in dextrose 5 % 250 mL IVPB (not administered)  albuterol (PROVENTIL,VENTOLIN) solution continuous neb (15 mg/hr Nebulization Given 07/17/16 1446)  methylPREDNISolone sodium succinate (SOLU-MEDROL) 125 mg/2 mL injection 125 mg (125 mg Intravenous Given 07/17/16 1607)  ipratropium (ATROVENT) nebulizer solution 1 mg (1 mg Nebulization Given 07/17/16 1446)     Initial Impression / Assessment and Plan / ED Course  I have reviewed the triage vital signs and the nursing notes.  Pertinent labs & imaging results that were available during my care of the patient were reviewed by me and considered in my medical decision making (see chart for details).  Clinical Course    70 year old male here with worsening tachypnea and hypoxia home. Patient is on oxygen at night and as needed throughout the day however last week he is. Possibly gone from only needing to walk to now require 4 L to keep saturations above 90%. On exam he has decreased breath sounds diffusely with prolonged expiratory phase and wheezing. Turn his oxygen down to 0 and he quickly dropped to 85% just sitting in the bed. Required 3 L in the emergency department to maintain saturations. No chest pain or fevers. Has had a cough. Chest x-ray with evidence of bilateral pleural effusions. Labs relatively unremarkable. ECG with aflutter new compared to most recent, but already on xarelto and diltiazem for same. Patient continuously tachypneic and requiring oxygen even after breathing treatment. Plan to start steroids and antibiotics and admitted to the hospital for further management of likely combined CHF and COPD exacerbation.  Final Clinical Impressions(s) / ED Diagnoses   Final diagnoses:  Hypoxia  COPD  exacerbation (HCC)  Pleural effusion, bilateral    New Prescriptions New Prescriptions   No medications on file     Marily Memos, MD 07/19/16 [phone]

## 2016-07-17 NOTE — H&P (Signed)
History and Physical    Mark [name] Mark [name]:[id number] DOB: Sep 11, 1946 DOA: 07/17/2016   PCP: Leo Grosser, MD   Patient coming from/Resides with: Private residence/this with wife  Admission status: Observation/telemetry -need to reevaluate in 24 hours to determine if it will be medically necessary to stay a minimum 2 midnights to rule out impending and/or unexpected changes in physiologic status that may differ from initial evaluation performed in the ER and/or at time of admission. He presents with productive cough with clear sputum not associated with fevers or leukocytosis which is concerning for acute exacerbation of COPD/chronic bronchiectasis, he is on chronic oxygen and has increased from a nocturnal baseline of 2 L/m to continue with 4 L/m, he will require IV steroids. He also has symptoms consistent with acute diastolic/right heart failure with weight gain, elevated BNP, and abnormal chest x-ray with pleural effusion. He will require IV Lasix for several doses. He has underlying stage IV chronic kidney disease with mild metabolic acidemia and will require close monitoring of chemistries with aggressive diuresis.   Chief Complaint: Shortness of breath and cough  HPI: Mark [name] is a 70 y.o. male with medical history significant for abdominal aortic aneurysm status post resection and grafting in 2015, atrial fibrillation on Xarelto, tonic kidney disease stage IV, dyslipidemia, anemia likely related to chronic kidney disease, hypertension, COPD with bronchiectasis and history of recurrent right pleural effusion. Patient reports for several days increased cough with thick clear sticky sputum. Last week he doubled up on his dosage of Lasix from 40 mg daily milligrams for 2 days and also total of 5 pounds. Unfortunately when he went back to his 40 mg dosage his weight went right back up. Continues to have issues with peripheral pitting edema. At baseline he utilizes 2 L nasal cannula  oxygen only at night when sleeping but has had increasing dyspnea and is wearing 4 L continuously.  ED Course:  Vital Signs: BP 170/84 (BP Location: Left Arm)   Pulse 66   Temp 97.6 F (36.4 C) (Oral)   Resp 20   Ht 5' 6.5" (1.689 m)   Wt 67.6 kg (149 lb)   SpO2 94%   BMI 23.69 kg/m  2 view chest x-ray: Mild bibasilar edema with mild bilateral pleural effusions right greater than left Lab data: Sodium 138, potassium 4.7, chloride 106, CO2 23, BUN 44, creatinine 2.98, GFR 20, glucose 176, anion gap 9, BNP 915, white count 6100 differential not pain, hemoglobin 11.8, platelets 128,000 ABG: PH 7.29, PCO2 47.3, PO2 81, acid base deficit 4.0, bicarbonate 23.1 (on 4 L oxygen) Medications and treatments: Albuterol continuous neb 15 mg/hr 1, Solu-Medrol 125 mg IV 1, Atrovent neb 1, Zithromax 500 mg IV 1  Review of Systems:  In addition to the HPI above,  No Fever-chills, myalgias or other constitutional symptoms No Headache, changes with Vision or hearing, new weakness, tingling, numbness in any extremity, dizziness, dysarthria or word finding difficulty, gait disturbance or imbalance, tremors or seizure activity No problems swallowing food or Liquids, indigestion/reflux, choking or coughing while eating, abdominal pain with or after eating No Chest pain, palpitations, orthopnea No Abdominal pain, N/V, melena,hematochezia, dark tarry stools, constipation No dysuria, malodorous urine, hematuria or flank pain No new skin rashes, lesions, masses or bruises, No new joint pains, aches, swelling or redness No recent unintentional weight gain or loss No polyuria, polydypsia or polyphagia   Past Medical History:  Diagnosis Date  . AAA (abdominal aortic aneurysm) (HCC)  a. 09/2013: Resection and grafting of abdominal aortic aneurysm with insertion of an aorto by common iliac graft using 18 x 9 mm Hemashield Dacron graft.  . Arthritis    "little in my hands" (09/08/2013)  . Chronic diastolic  CHF (congestive heart failure) (HCC)    a.  Echo (08/13/2013): EF 55-60%, normal wall motion, mild MR, mild LAE  . Chronic kidney disease (CKD), stage III (moderate)   . COPD (chronic obstructive pulmonary disease) (HCC)   . 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 GERD (gastroesophageal reflux disease)   . H/O hiatal hernia   . Hx of cardiovascular stress test    a. Myoview 09/2012:  no scar or ischemia  . Hyperlipidemia   . Hypertension    Sees Dr. Lynnea FerrierWarren Pickard  . PAF (paroxysmal atrial fibrillation) (HCC)    a. amiodarone Rx started 08/2013;  b. s/p TEE-DCCV 09/2013 => recurrent AFib => repeat DCCV => NSR;   c. Xarelto 15 QD due to CKD  . Pneumonia   . Pulmonary hypertension associated with unclear multi-factorial mechanisms    Only on echo (8/17).      Past Surgical History:  Procedure Laterality Date  . ABDOMINAL AORTIC ANEURYSM REPAIR  10/15/2012   Procedure: ANEURYSM ABDOMINAL AORTIC REPAIR;  Surgeon: Pryor OchoaJames D Lawson, MD;  [facility];  Service: Vascular;  Laterality: N/A;  Resection and Grafting of Abdominal Aortic Aneurysm - Aortobi-Iliac   . ABDOMINAL AORTIC ANEURYSM REPAIR  10-15-2012  . CARDIOVERSION N/A 09/22/2013   Procedure: CARDIOVERSION;  Surgeon: Lars MassonKatarina H Nelson, MD;  [facility];  Service: Cardiovascular;  Laterality: N/A;  . CARDIOVERSION  Jan. 12, 2015   Dr. Doylene CanningGregg W. Ladona Ridgelaylor  . CARDIOVERSION N/A 09/28/2013   Procedure: CARDIOVERSION;  Surgeon: Marinus MawGregg W Taylor, MD;  [facility];  Service: Cardiovascular;  Laterality: N/A;  . CARDIOVERSION N/A 06/04/2016   Procedure: CARDIOVERSION;  Surgeon: Lars MassonKatarina H Nelson, MD;  [facility];  Service: Cardiovascular;  Laterality: N/A;  . INGUINAL HERNIA REPAIR Right 12/22/2014   Procedure: OPEN RIGHT INGUINAL HERNIA REPAIR WITH MESH;  Surgeon: Axel FillerArmando Ramirez, MD;  Location: [facility] ORS;  Service: General;  Laterality: Right;  . INSERTION OF MESH N/A 12/22/2014   Procedure: INSERTION OF  MESH;  Surgeon: Axel FillerArmando Ramirez, MD;  Location: [facility] ORS;  Service: General;  Laterality: N/A;  . KNEE ARTHROSCOPY Right   . TEE WITHOUT CARDIOVERSION N/A 09/22/2013   Procedure: TRANSESOPHAGEAL ECHOCARDIOGRAM (TEE);  Surgeon: Lars MassonKatarina H Nelson, MD;  [facility];  Service: Cardiovascular;  Laterality: N/A;    Social History   Social History  . Marital status: Married    Spouse name: Mark CellaDorothy  . Number of children: 4  . Years of education: N/A   Occupational History  . Works PT as a Education administratorpainter    Social History Main Topics  . Smoking status: Former Smoker    Packs/day: 1.00    Years: 56.00    Types: Cigarettes    Quit date: 07/31/2013  . Smokeless tobacco: Former NeurosurgeonUser    Types: Chew  . Alcohol use No  . Drug use: No  . Sexual activity: Not Currently   Other Topics Concern  . Not on file   Social History Narrative   Married.  Lives with wife.  Ambulates without assistance.    Mobility: Without assistive devices Work history: Not obtained   Allergies  Allergen Reactions  . Niaspan [Niacin Er] Anaphylaxis    Family  History  Problem Relation Age of Onset  . Diabetes Mother   . Tuberculosis Mother   . Heart disease Father   . Heart attack Father   . Heart disease Son     Blood clot:  Arm     Prior to Admission medications   Medication Sig Start Date End Date Taking? Authorizing Provider  atorvastatin (LIPITOR) 10 MG tablet Take 10 mg by mouth at bedtime.    Yes Historical Provider, MD  budesonide-formoterol (SYMBICORT) 80-4.5 MCG/ACT inhaler Inhale 2 puffs into the lungs 2 (two) times daily. 06/05/16  Yes Nyoka Cowden, MD  dextromethorphan-guaiFENesin [facility] DM) 30-600 MG 12hr tablet Take 1 tablet by mouth 2 (two) times daily as needed.    Yes Historical Provider, MD  diltiazem (TAZTIA XT) 180 MG 24 hr capsule Take 2 capsules (360 mg total) by mouth daily. 05/30/16  Yes Donita Brooks, MD  doxazosin (CARDURA) 4 MG tablet Take 1 tablet (4 mg total) by  mouth daily. 05/30/16  Yes Donita Brooks, MD  furosemide (LASIX) 40 MG tablet TAKE 1 TABLET (40 MG TOTAL) BY MOUTH DAILY. 03/21/16  Yes Donita Brooks, MD  metoprolol succinate (TOPROL-XL) 100 MG 24 hr tablet Take 1 tablet (100 mg total) by mouth daily. Take with or immediately following a meal. 04/06/16  Yes Donita Brooks, MD  OXYGEN Use 2 L at bedtime and as needed for shortness of breath and activity   Yes Historical Provider, MD  ranitidine (ZANTAC) 150 MG capsule Take 150 mg by mouth daily as needed for heartburn.   Yes Historical Provider, MD  Rivaroxaban (XARELTO) 15 MG TABS tablet Take 1 tablet (15 mg total) by mouth daily with supper. 01/10/16  Yes Donita Brooks, MD  VENTOLIN HFA 108 (90 Base) MCG/ACT inhaler INHALE 2 PUFFS INTO THE LUNGS EVERY 6 (SIX) HOURS AS NEEDED FOR WHEEZING OR SHORTNESS OF BREATH. Patient taking differently: INHALE 2 PUFFS INTO THE LUNGS EVERY 4 HOURS AS NEEDED FOR WHEEZING OR SHORTNESS OF BREATH Plan B 11/29/15  Yes Donita Brooks, MD  Simethicone (GAS-X PO) Take by mouth. Use as needed for gas    Historical Provider, MD    Physical Exam: Vitals:   07/17/16 1349 07/17/16 1445 07/17/16 1449 07/17/16 1628  BP: 171/94 167/94  170/84  Pulse: 64 (!) 57  66  Resp: 20 21  20   Temp:      TempSrc:      SpO2: 95% 96% 97% 94%  Weight:      Height:          Constitutional: NAD, calm, comfortable Eyes: PERRL, lids and conjunctivae normal ENMT: Mucous membranes are moist. Posterior pharynx clear of any exudate or lesions.Normal dentition.  Neck: normal, supple, no masses, no thyromegaly Respiratory: Very diminished throughout without obvious wheezing, markedly diminished in the bases Normal respiratory effort. No accessory muscle use. 4 L/m Cardiovascular: Regular rate and atrial flutter rhythm with variable AV conduction, no murmurs / rubs / gallops. 2+ bilateral lower extremity edema or pronounced at ankles. 2+ pedal pulses. No carotid bruits. 5 cm  JVD Abdomen: no tenderness, no masses palpated. No hepatosplenomegaly. Bowel sounds positive.  Musculoskeletal: no clubbing / cyanosis. No joint deformity upper and lower extremities. Good ROM, no contractures. Normal muscle tone.  Skin: no rashes, lesions, ulcers. No induration Neurologic: CN 2-12 grossly intact. Sensation intact, DTR normal. Strength 5/5 x all 4 extremities.  Psychiatric: Normal judgment and insight. Alert and oriented x 3. Normal mood.  Labs on Admission: I have personally reviewed following labs and imaging studies  CBC:  Recent Labs Lab 07/17/16 1302  WBC 6.1  HGB 11.8*  HCT 37.2*  MCV 93.2  PLT 128*   Basic Metabolic Panel:  Recent Labs Lab 07/17/16 1302  NA 138  K 4.7  CL 106  CO2 23  GLUCOSE 176*  BUN 44*  CREATININE 2.98*  CALCIUM 9.0   GFR: Estimated Creatinine Clearance: 20.9 mL/min (by C-G formula based on SCr of 2.98 mg/dL (H)). Liver Function Tests: No results for input(s): AST, ALT, ALKPHOS, BILITOT, PROT, ALBUMIN in the last 168 hours. No results for input(s): LIPASE, AMYLASE in the last 168 hours. No results for input(s): AMMONIA in the last 168 hours. Coagulation Profile: No results for input(s): INR, PROTIME in the last 168 hours. Cardiac Enzymes: No results for input(s): CKTOTAL, CKMB, CKMBINDEX, TROPONINI in the last 168 hours. BNP (last 3 results)  Recent Labs  05/08/16 1212  PROBNP 1,079.0*   HbA1C: No results for input(s): HGBA1C in the last 72 hours. CBG: No results for input(s): GLUCAP in the last 168 hours. Lipid Profile: No results for input(s): CHOL, HDL, LDLCALC, TRIG, CHOLHDL, LDLDIRECT in the last 72 hours. Thyroid Function Tests: No results for input(s): TSH, T4TOTAL, FREET4, T3FREE, THYROIDAB in the last 72 hours. Anemia Panel: No results for input(s): VITAMINB12, FOLATE, FERRITIN, TIBC, IRON, RETICCTPCT in the last 72 hours. Urine analysis:    Component Value Date/Time   COLORURINE YELLOW 10/07/2014  1932   APPEARANCEUR CLOUDY (A) 10/07/2014 1932   LABSPEC 1.010 10/07/2014 1932   PHURINE 6.0 10/07/2014 1932   GLUCOSEU NEGATIVE 10/07/2014 1932   HGBUR NEGATIVE 10/07/2014 1932   BILIRUBINUR NEGATIVE 10/07/2014 1932   KETONESUR NEGATIVE 10/07/2014 1932   PROTEINUR NEGATIVE 10/07/2014 1932   UROBILINOGEN 0.2 10/07/2014 1932   NITRITE NEGATIVE 10/07/2014 1932   LEUKOCYTESUR NEGATIVE 10/07/2014 1932   Sepsis Labs: @LABRCNTIP (procalcitonin:4,lacticidven:4) )No results found for this or any previous visit (from the past 240 hour(s)).   Radiological Exams on Admission: Dg Chest 2 View  Result Date: 07/17/2016 CLINICAL DATA:  Shortness of breath. EXAM: CHEST  2 VIEW COMPARISON:  Radiographs of May 08, 2016. FINDINGS: Stable cardiomediastinal silhouette. Atherosclerosis of thoracic aorta is noted. Mild bibasilar edema or atelectasis is noted with mild bilateral pleural effusions. No pneumothorax is noted. Bony thorax is unremarkable. IMPRESSION: Mild bibasilar edema or atelectasis is noted with mild bilateral pleural effusions. Electronically Signed   By: Lupita RaiderJames  Green Jr, M.D.   On: 07/17/2016 15:23    EKG: (Independently reviewed) atrial flutter with variable AV induction, ventricular rate 63 bpm, QTC 435 ms, no ischemic changes  Assessment/Plan Principal Problem:   Acute on chronic respiratory failure with hypoxia  -Presents with productive cough clear sputum without fevers or chills and no leukocytosis; has evidence of pleural effusions and heart failure based on chest x-ray and elevated BNP and therefore has multifactorial respiratory failure related to acute COPD with bronchiectasis and suspected heart failure -Treat underlying problems -Has required increased from baseline oxygen so we'll continue oxygen and titrate back to baseline as tolerates  Active Problems:   Acute on chronic diastolic CHF (congestive heart failure)/ Acute right heart failure/Recurrent right pleural  effusion -Patient reports weight increase last week that was responsive to 2 days of oral Lasix 80 mg with subsequent immediate increase in weight with reduction and Lasix dosage -Suspect progressive chronic kidney disease requiring much higher baseline dose of Lasix -We'll start with 60 mg  of IV Lasix for 3 doses and follow renal function closely -No ACE inhibitor secondary to chronic kidney disease -Continue preadmission Toprol and Cardura -Suspect progressive right-sided heart failure with underlying known severe pulmonary hypertension, moderate RV dilatation with mild RV systolic dysfunction and moderate TR on last echocardiogram August 2015 -Consideration in the past given to thoracentesis but current effusions do not seem very large on plain films    COPD GOLD II with marked reversibility/Bronchiectasis with acute exacerbation/pleural effusions right greater than left  -Suspect acute bronchiectasis -Solu-Medrol 60 mg IV every 12 hours and likely rapid transition to prednisone with taper -History of A. fib/RVR and currently in atrial flutter with variable conduction so we'll utilize Xopenex for bronchodilator -Incentive spirometry -No fever or leukocytosis or other indication of infectious process so no indication to begin antibiotics- did receive Zithromax in the ER -Continue preadmission guaifenesin    HTN (hypertension) -Moderate control and suspect volume overload contributing to elevated blood pressure readings -Anticipate blood pressure should improve with aggressive diuresis -Continue Cardura, Toprol and diltiazem    CKD (chronic kidney disease), stage IV/Metabolic acidosis -Renal function stable and at baseline although now with slightly low pH on ABG concerning for metabolic acidosis -May require oral bicarbonate -Followed by Dr. Allena Katz in the outpatient setting -Still makes urine so no indication to consult nephrology acutely    PAF (paroxysmal atrial fibrillation)   -Current EKG with atrial flutter with variable conduction but otherwise rate controlled -Continue beta blocker and calcium channel blocker -Continue decrease dose Xarelto (based on low GFR/renal function) -CHADVASc= 4 -Followed by Dr. Kerney Elbe in the outpatient setting     HLD (hyperlipidemia) -Continue Lipitor    Anemia in CKD (chronic kidney disease) -Hemoglobin stable and at baseline of around 12      DVT prophylaxis: Xarelto which has been dosage adjusted based on GFR  Code Status: Full  Family Communication: Wife at bedside Disposition Plan: Anticipate discharge back to preadmission home environment once medically stable Consults called: None     Naftuli Dalsanto L. ANP-BC [facility] Pager [phone]   If 7PM-7AM, please contact night-coverage [email or web address] Password [email or web address]  07/17/2016, 5:11 PM

## 2016-07-17 NOTE — ED Notes (Signed)
Pt states that he has been increasingly short of breath for 2 days , has been using O2 without relief-- productive cough for clear "sticky" mucus. Wife at bedside.

## 2016-07-18 ENCOUNTER — Other Ambulatory Visit: Payer: Self-pay | Admitting: Family Medicine

## 2016-07-18 ENCOUNTER — Observation Stay (HOSPITAL_COMMUNITY): Payer: PPO

## 2016-07-18 DIAGNOSIS — E872 Acidosis: Secondary | ICD-10-CM | POA: Diagnosis present

## 2016-07-18 DIAGNOSIS — I5031 Acute diastolic (congestive) heart failure: Secondary | ICD-10-CM | POA: Diagnosis not present

## 2016-07-18 DIAGNOSIS — N184 Chronic kidney disease, stage 4 (severe): Secondary | ICD-10-CM | POA: Diagnosis present

## 2016-07-18 DIAGNOSIS — I484 Atypical atrial flutter: Secondary | ICD-10-CM | POA: Diagnosis present

## 2016-07-18 DIAGNOSIS — J9621 Acute and chronic respiratory failure with hypoxia: Secondary | ICD-10-CM | POA: Diagnosis present

## 2016-07-18 DIAGNOSIS — Z7901 Long term (current) use of anticoagulants: Secondary | ICD-10-CM | POA: Diagnosis not present

## 2016-07-18 DIAGNOSIS — E1122 Type 2 diabetes mellitus with diabetic chronic kidney disease: Secondary | ICD-10-CM | POA: Diagnosis present

## 2016-07-18 DIAGNOSIS — I480 Paroxysmal atrial fibrillation: Secondary | ICD-10-CM | POA: Diagnosis not present

## 2016-07-18 DIAGNOSIS — R0602 Shortness of breath: Secondary | ICD-10-CM | POA: Diagnosis present

## 2016-07-18 DIAGNOSIS — Z515 Encounter for palliative care: Secondary | ICD-10-CM | POA: Diagnosis present

## 2016-07-18 DIAGNOSIS — J441 Chronic obstructive pulmonary disease with (acute) exacerbation: Principal | ICD-10-CM

## 2016-07-18 DIAGNOSIS — I130 Hypertensive heart and chronic kidney disease with heart failure and stage 1 through stage 4 chronic kidney disease, or unspecified chronic kidney disease: Secondary | ICD-10-CM | POA: Diagnosis present

## 2016-07-18 DIAGNOSIS — N179 Acute kidney failure, unspecified: Secondary | ICD-10-CM | POA: Diagnosis present

## 2016-07-18 DIAGNOSIS — E875 Hyperkalemia: Secondary | ICD-10-CM | POA: Diagnosis present

## 2016-07-18 DIAGNOSIS — I5033 Acute on chronic diastolic (congestive) heart failure: Secondary | ICD-10-CM | POA: Diagnosis present

## 2016-07-18 DIAGNOSIS — I2781 Cor pulmonale (chronic): Secondary | ICD-10-CM | POA: Diagnosis present

## 2016-07-18 DIAGNOSIS — Z8679 Personal history of other diseases of the circulatory system: Secondary | ICD-10-CM | POA: Diagnosis not present

## 2016-07-18 DIAGNOSIS — I2721 Secondary pulmonary arterial hypertension: Secondary | ICD-10-CM | POA: Diagnosis not present

## 2016-07-18 DIAGNOSIS — J90 Pleural effusion, not elsewhere classified: Secondary | ICD-10-CM | POA: Diagnosis present

## 2016-07-18 DIAGNOSIS — I5082 Biventricular heart failure: Secondary | ICD-10-CM | POA: Diagnosis present

## 2016-07-18 DIAGNOSIS — R0600 Dyspnea, unspecified: Secondary | ICD-10-CM | POA: Diagnosis not present

## 2016-07-18 DIAGNOSIS — Z66 Do not resuscitate: Secondary | ICD-10-CM | POA: Diagnosis not present

## 2016-07-18 DIAGNOSIS — Z888 Allergy status to other drugs, medicaments and biological substances status: Secondary | ICD-10-CM | POA: Diagnosis not present

## 2016-07-18 DIAGNOSIS — I50811 Acute right heart failure: Secondary | ICD-10-CM

## 2016-07-18 DIAGNOSIS — K219 Gastro-esophageal reflux disease without esophagitis: Secondary | ICD-10-CM | POA: Diagnosis present

## 2016-07-18 DIAGNOSIS — D631 Anemia in chronic kidney disease: Secondary | ICD-10-CM | POA: Diagnosis present

## 2016-07-18 DIAGNOSIS — Z9981 Dependence on supplemental oxygen: Secondary | ICD-10-CM | POA: Diagnosis not present

## 2016-07-18 DIAGNOSIS — I481 Persistent atrial fibrillation: Secondary | ICD-10-CM | POA: Diagnosis not present

## 2016-07-18 DIAGNOSIS — E785 Hyperlipidemia, unspecified: Secondary | ICD-10-CM | POA: Diagnosis present

## 2016-07-18 DIAGNOSIS — N185 Chronic kidney disease, stage 5: Secondary | ICD-10-CM | POA: Diagnosis not present

## 2016-07-18 DIAGNOSIS — I2609 Other pulmonary embolism with acute cor pulmonale: Secondary | ICD-10-CM | POA: Diagnosis not present

## 2016-07-18 LAB — BASIC METABOLIC PANEL
ANION GAP: 8 (ref 5–15)
BUN: 51 mg/dL — ABNORMAL HIGH (ref 6–20)
CALCIUM: 9.1 mg/dL (ref 8.9–10.3)
CO2: 24 mmol/L (ref 22–32)
CREATININE: 3.43 mg/dL — AB (ref 0.61–1.24)
Chloride: 104 mmol/L (ref 101–111)
GFR calc Af Amer: 19 mL/min — ABNORMAL LOW (ref >60)
GFR, EST NON AFRICAN AMERICAN: 17 mL/min — AB (ref >60)
GLUCOSE: 210 mg/dL — AB (ref 65–99)
Potassium: 5.1 mmol/L (ref 3.5–5.1)
Sodium: 136 mmol/L (ref 135–145)

## 2016-07-18 MED ORDER — AMLODIPINE BESYLATE 5 MG PO TABS
5.0000 mg | ORAL_TABLET | Freq: Every day | ORAL | Status: DC
  Administered 2016-07-18 – 2016-07-22 (×5): 5 mg via ORAL
  Filled 2016-07-18 (×5): qty 1

## 2016-07-18 MED ORDER — DILTIAZEM HCL ER COATED BEADS 240 MG PO CP24
240.0000 mg | ORAL_CAPSULE | Freq: Every day | ORAL | Status: DC
  Administered 2016-07-19 – 2016-07-21 (×3): 240 mg via ORAL
  Filled 2016-07-18 (×3): qty 1

## 2016-07-18 NOTE — Progress Notes (Signed)
Pt. admitted to [facility]- 02 from ER via stretcher; alert and oriented x4; no distress noted; oriented to room and hospital policies.

## 2016-07-18 NOTE — Progress Notes (Signed)
MD notified BP 180/99.  Will continue to monitor.

## 2016-07-18 NOTE — Progress Notes (Signed)
PROGRESS NOTE  Mark [name]  WUJ:[id number] DOB: 1946/04/02 DOA: 07/17/2016 PCP: Leo GrosserPICKARD,WARREN TOM, MD   Brief Narrative: Mark [name] is a 70 y.o. male with medical history significant for abdominal aortic aneurysm status post resection and grafting in 2015, atrial fibrillation on xarelto, CKD stage IV with anemia, dyslipidemia, hypertension, COPD with bronchiectasis and history of recurrent right pleural effusion. Patient reported several days of increased cough with thick clear sticky sputum. Last week he doubled up on his dosage of Lasix from 40 mg daily milligrams for 2 days and also total of 5 pounds. Unfortunately when he went back to his 40 mg dosage his weight went right back up. Continues to have issues with peripheral pitting edema. At baseline he utilizes 2 L nasal cannula oxygen only at night when sleeping but has had increasing dyspnea and is wearing 4 L continuously.  Assessment & Plan: Principal Problem:   Acute on chronic respiratory failure with hypoxia (HCC) Active Problems:   HTN (hypertension)   HLD (hyperlipidemia)   Acute on chronic diastolic CHF (congestive heart failure) (HCC)   COPD GOLD II with marked reversibility    CKD (chronic kidney disease), stage IV (HCC)   PAF (paroxysmal atrial fibrillation) (HCC)   Bronchiectasis with acute exacerbation (HCC)   Acute right heart failure   Metabolic acidosis   Anemia in CKD (chronic kidney disease)   Recurrent right pleural effusion  Acute on chronic respiratory failure with hypoxia: multifactorial respiratory failure related to acute COPD with bronchiectasis and suspected heart failure. Discharge weight  -Treat underlying problems - Wean oxygen as able  Acute on chronic diastolic CHF (congestive heart failure)/ Acute right heart failure/Recurrent right pleural effusion: Requiring increased lasix doses due to progressive renal impairment. - Continuing 60 mg of IV Lasix for 3 doses and follow renal function  closely: Down 1lbs so far -No ACE inhibitor secondary to chronic kidney disease -Continue preadmission Toprol and Cardura -Suspect progressive right-sided heart failure with underlying known severe pulmonary hypertension, moderate RV dilatation with mild RV systolic dysfunction and moderate TR on last echocardiogram August 2015 -Consideration in the past given to thoracentesis but current effusions do not seem very large on plain films  COPD GOLD II with marked reversibility/Bronchiectasis with acute exacerbation/pleural effusions right greater than left  -Suspect acute bronchiectasis -Solu-Medrol 60 mg IV every 12 hours, plan transition to prednisone with taper 11/2 -History of A. fib/RVR and currently in atrial flutter with variable conduction so we'll utilize Xopenex for bronchodilator -Incentive spirometry -Continue preadmission guaifenesin  CKD (chronic kidney disease), stage IV/Metabolic acidosis -Renal function stable and at baseline although now with slightly low pH on ABG concerning for metabolic acidosis -May require oral bicarbonate -Followed by Dr. Allena KatzPatel in the outpatient setting; consider nephrology consult if Cr will not allow further diuresis. Still makes urine so no indication to consult nephrology acutely.  HTN (hypertension): Moderate control and suspect volume overload contributing to elevated blood pressure readings -Anticipate blood pressure should improve with aggressive diuresis -Continue Cardura, Toprol and diltiazem  PAF (paroxysmal atrial fibrillation) CHADVASc= 4, decreased xarelto dose for renal impairment. Admission ECG with atrial flutter with variable conduction but otherwise rate controlled -Continue beta blocker and calcium channel blocker   HLD (hyperlipidemia) -Continue Lipitor  Anemia in CKD (chronic kidney disease) -Hemoglobin stable and at baseline of around 12  DVT prophylaxis: xarelto with GFR-based dosing Code Status: Full Family  Communication: Wife and granddaughter at bedside Disposition Plan: Ongoing IV diuresis and discharge likely back to  home.  Consultants:   Cardiology  Nephrology  Procedures:  None  Antimicrobials:  s/p azithromycin x1 10/31   Subjective: Pt feels no better than at admission. Has been urinating while here, but not as much as he has in the past with IV diuresis. Dyspnea stable. No chest pain.   Objective: Vitals:   07/18/16 0458 07/18/16 0835 07/18/16 1232 07/18/16 1342  BP: (!) 171/89 (!) 159/95 (!) 180/99   Pulse:  (!) 103 68   Resp:   18   Temp:   97.6 F (36.4 C)   TempSrc:   Oral   SpO2:   93% 93%  Weight: 67.1 kg (148 lb)     Height:        Intake/Output Summary (Last 24 hours) at 07/18/16 1457 Last data filed at 07/18/16 1345  Gross per 24 hour  Intake              480 ml  Output             1325 ml  Net             -845 ml   Filed Weights   07/17/16 1249 07/17/16 2106 07/18/16 0458  Weight: 67.6 kg (149 lb) 67.5 kg (148 lb 14.4 oz) 67.1 kg (148 lb)    Examination: General exam: 70 y.o. male in no distress Respiratory system: Non-labored breathing on 4 lpm. Decreased air movement without significant wheezes.  Cardiovascular system: Regular rate and rhythm. No murmur, rub, or gallop.+ JVD, and 2+ pedal edema. Gastrointestinal system: Abdomen soft, non-tender, non-distended, with normoactive bowel sounds. No organomegaly or masses felt. Central nervous system: Alert and oriented. No focal neurological deficits. Extremities: Warm, no deformities Skin: No rashes, lesions no ulcers Psychiatry: Judgement and insight appear normal. Mood & affect appropriate.   Data Reviewed: I have personally reviewed following labs and imaging studies  CBC:  Recent Labs Lab 07/17/16 1302  WBC 6.1  HGB 11.8*  HCT 37.2*  MCV 93.2  PLT 128*   Basic Metabolic Panel:  Recent Labs Lab 07/17/16 1302 07/17/16 2113 07/18/16 0349  NA 138  --  136  K 4.7  --  5.1  CL  106  --  104  CO2 23  --  24  GLUCOSE 176*  --  210*  BUN 44*  --  51*  CREATININE 2.98*  --  3.43*  CALCIUM 9.0  --  9.1  MG  --  2.2  --    GFR: Estimated Creatinine Clearance: 18.2 mL/min (by C-G formula based on SCr of 3.43 mg/dL (H)). Liver Function Tests: No results for input(s): AST, ALT, ALKPHOS, BILITOT, PROT, ALBUMIN in the last 168 hours. No results for input(s): LIPASE, AMYLASE in the last 168 hours. No results for input(s): AMMONIA in the last 168 hours. Coagulation Profile: No results for input(s): INR, PROTIME in the last 168 hours. Cardiac Enzymes: No results for input(s): CKTOTAL, CKMB, CKMBINDEX, TROPONINI in the last 168 hours. BNP (last 3 results)  Recent Labs  05/08/16 1212  PROBNP 1,079.0*   HbA1C: No results for input(s): HGBA1C in the last 72 hours. CBG: No results for input(s): GLUCAP in the last 168 hours. Lipid Profile: No results for input(s): CHOL, HDL, LDLCALC, TRIG, CHOLHDL, LDLDIRECT in the last 72 hours. Thyroid Function Tests: No results for input(s): TSH, T4TOTAL, FREET4, T3FREE, THYROIDAB in the last 72 hours. Anemia Panel: No results for input(s): VITAMINB12, FOLATE, FERRITIN, TIBC, IRON, RETICCTPCT in the last 72 hours.  Urine analysis:    Component Value Date/Time   COLORURINE YELLOW 10/07/2014 1932   APPEARANCEUR CLOUDY (A) 10/07/2014 1932   LABSPEC 1.010 10/07/2014 1932   PHURINE 6.0 10/07/2014 1932   GLUCOSEU NEGATIVE 10/07/2014 1932   HGBUR NEGATIVE 10/07/2014 1932   BILIRUBINUR NEGATIVE 10/07/2014 1932   KETONESUR NEGATIVE 10/07/2014 1932   PROTEINUR NEGATIVE 10/07/2014 1932   UROBILINOGEN 0.2 10/07/2014 1932   NITRITE NEGATIVE 10/07/2014 1932   LEUKOCYTESUR NEGATIVE 10/07/2014 1932   Sepsis Labs: @LABRCNTIP (procalcitonin:4,lacticidven:4)  )No results found for this or any previous visit (from the past 240 hour(s)).   Radiology Studies: Dg Chest 2 View  Result Date: 07/18/2016 CLINICAL DATA:  Follow-up right pleural  effusion EXAM: CHEST  2 VIEW COMPARISON:  PA and lateral chest x-ray of July 17, 2016 FINDINGS: The lungs are mildly hyperinflated. There small bilateral pleural effusions which are fairly stable. There is no pneumothorax. No pulmonary parenchymal masses or discrete infiltrates are observed. The heart is top-normal in size. The pulmonary vascularity is not engorged. The interstitial markings remain increased. There is calcification in the wall of the aortic arch. IMPRESSION: COPD. Stable small bilateral pleural effusions. Stable mild interstitial prominence diffusely which may reflect mild interstitial edema or acute on chronic bronchitic changes. Electronically Signed   By: David  [place] M.D.   On: 07/18/2016 08:00   Dg Chest 2 View  Result Date: 07/17/2016 CLINICAL DATA:  Shortness of breath. EXAM: CHEST  2 VIEW COMPARISON:  Radiographs of May 08, 2016. FINDINGS: Stable cardiomediastinal silhouette. Atherosclerosis of thoracic aorta is noted. Mild bibasilar edema or atelectasis is noted with mild bilateral pleural effusions. No pneumothorax is noted. Bony thorax is unremarkable. IMPRESSION: Mild bibasilar edema or atelectasis is noted with mild bilateral pleural effusions. Electronically Signed   By: Lupita Raider, M.D.   On: 07/17/2016 15:23    Scheduled Meds: . atorvastatin  10 mg Oral QHS  . diltiazem (CARDIZEM CD) 24 hr capsule 360 mg  360 mg Oral Daily  . doxazosin  4 mg Oral Daily  . famotidine  20 mg Oral Daily  . furosemide  60 mg Intravenous Q12H  . levalbuterol  0.63 mg Nebulization Q8H  . methylPREDNISolone (SOLU-MEDROL) injection  60 mg Intravenous Q12H  . metoprolol succinate  100 mg Oral Daily  . mometasone-formoterol  2 puff Inhalation BID  . Rivaroxaban  15 mg Oral Q supper  . sodium chloride flush  3 mL Intravenous Q12H   Continuous Infusions:    LOS: 0 days   Time spent: 25 minutes.  Hazeline Junker, MD [facility] Pager [phone]  If 7PM-7AM, please  contact night-coverage [email or web address] Password [email or web address] 07/18/2016, 2:57 PM

## 2016-07-18 NOTE — Consult Note (Signed)
   [facility] Inpatient Consult   07/18/2016  DERELLE COCKRELL [birth year] [phone]   Patient was screened for COPD HF exacerbation with his [employer] plan.   Met with the patient regarding the benefits of [place] [employer] services.  Explained that [employer] is a covered benefit of insurance.  Patient states, "I'm not even sure I will have this insurance next year.  The guy is coming by to let me and my wife know the best insurances for next year."   Review information for [facility] Care Management and a brochure with contact information was given and accepted.  Explained that [place] [employer] does not interfere with or replace any services arranged by the inpatient care management staff.  Patient declined services with [facility] [employer].  "I don't think I need anything right now but I will call if I do. The folks from the insurance calls as well."    For questions, please contact:  Natividad Brood, RN BSN [facility] Liaison  [phone] business mobile phone Toll free office [phone]

## 2016-07-18 NOTE — Care Management Obs Status (Signed)
MEDICARE OBSERVATION STATUS NOTIFICATION   Patient Details  Name: Mark [name] MRN: [id number] Date of Birth: 1946-01-08   Medicare Observation Status Notification Given:  Yes (Explained to patient, answered patient's questions, patient refused to sign)    Antony HasteBennett, Ryane Konieczny Harris, RN 07/18/2016, 6:36 PM

## 2016-07-18 NOTE — Plan of Care (Signed)
Problem: Education: Goal: Knowledge of [facility] Education information/materials will improve Outcome: Progressing POC reviewed with pt.   

## 2016-07-18 NOTE — Consult Note (Signed)
CARDIOLOGY CONSULT NOTE   Patient ID: Mark [name] MRN: [id number] DOB/AGE: Oct 03, 1945 70 y.o.  Admit date: 07/17/2016  Primary Physician   Mark GrosserPICKARD,WARREN TOM, MD Primary Cardiologist   Dr. Clifton [name] Reason for Consultation   CHF Requesting Physician  Dr. Jarvis [name]  HPI: Mark [name] is a 70 y.o. male with a history of PAF, AAA s/p repair 09/2013, chronic diastolic CHF,  COPD on home 2L oxygen at night, CKD stage III-IV, GERD, HTN, HLD and pulmonary hypertension who presented for dyspnea, LE edema and cough 07/17/16.   He was admitted in November 2014 with COPD and atrial fib with RVR. Echo showed normal LV function. He has been managed on Xarelto which was stopped for some time due to recurrent epistaxis.  DCCV was attempted several times in January 2015 while on amiodarone and he maintained sinus for 2 years after that. Amiodarone stopped due to lung issues. Recently had successful cardioversion 06/04/16 with restoration of sinus rhythm. Recently had moderate bilateral pulmonary effusion on chest xray. Mark [name] suggested consideration for thoracentesis if effusions worsen or do not resolve with diuresis. He was noted in aflutter when last seen by Dr. Clifton [name] 06/28/16. Continued Toprol and Diltiazem. Plan was to refer to EP if still dyspneic with volume overload. Since then requiring high dose of lasix for weight gain however noted relapse multiple times.    Last Echo 04/26/16 with normal LV function, RV volume and pressure overload, PA pressure of 59mm Hg. Stress myoview 04/26/16 with no ischemia. He is followed by nephrologist Mark [name].   Presented yesterday with several days hx of increased productive cough, LE edema, dyspnea and weight gain. Admits to having orthopnea and PND. No extra salt. Denies chest pain or palpitations however feels "out of rhythm".   CXR showed Mild bibasilar edema or atelectasis is noted with mild bilateral pleural effusions. Repeat CXR stable today. He was  treated with steroids and IV azithromycin. EKG showed atrial flutter at rate of 63 bpm at variable block. BNP 915. Scr increased to 3.43 from 2.98. Diuresed 570cc. Weight down 1lb.   Past Medical History:  Diagnosis Date  . AAA (abdominal aortic aneurysm) (HCC)    a. 09/2013: Resection and grafting of abdominal aortic aneurysm with insertion of an aorto by common iliac graft using 18 x 9 mm Hemashield Dacron graft.  . Arthritis    "little in my hands" (09/08/2013)  . Chronic diastolic CHF (congestive heart failure) (HCC)    a.  Echo (08/13/2013): EF 55-60%, normal wall motion, mild MR, mild LAE  . Chronic kidney disease (CKD), stage III (moderate)   . COPD (chronic obstructive pulmonary disease) (HCC)   . 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 GERD (gastroesophageal reflux disease)   . H/O hiatal hernia   . Hx of cardiovascular stress test    a. Myoview 09/2012:  no scar or ischemia  . Hyperlipidemia   . Hypertension    Sees Dr. Lynnea FerrierWarren [name]  . On home oxygen therapy    "2L @ night" (07/17/2016)  . PAF (paroxysmal atrial fibrillation) (HCC)    a. amiodarone Rx started 08/2013;  b. s/p TEE-DCCV 09/2013 => recurrent AFib => repeat DCCV => NSR;   c. Xarelto 15 QD due to CKD  . Pneumonia   . Pulmonary hypertension associated with unclear multi-factorial mechanisms    Only on echo (8/17).       Past Surgical History:  Procedure Laterality Date  . ABDOMINAL  AORTIC ANEURYSM REPAIR  10/15/2012   Procedure: ANEURYSM ABDOMINAL AORTIC REPAIR;  Surgeon: Pryor Ochoa, MD;  Location: [facility] OR;  Service: Vascular;  Laterality: N/A;  Resection and Grafting of Abdominal Aortic Aneurysm - Aortobi-Iliac   . ABDOMINAL AORTIC ANEURYSM REPAIR  10-15-2012  . CARDIOVERSION N/A 09/22/2013   Procedure: CARDIOVERSION;  Surgeon: Lars Masson, MD;  Location: [facility] ENDOSCOPY;  Service: Cardiovascular;  Laterality: N/A;  . CARDIOVERSION  Jan. 12, 2015   Dr. Doylene Canning. Ladona Ridgel  . CARDIOVERSION N/A  09/28/2013   Procedure: CARDIOVERSION;  Surgeon: Marinus Maw, MD;  Location: [facility] CATH LAB;  Service: Cardiovascular;  Laterality: N/A;  . CARDIOVERSION N/A 06/04/2016   Procedure: CARDIOVERSION;  Surgeon: Lars Masson, MD;  Location: [facility] ENDOSCOPY;  Service: Cardiovascular;  Laterality: N/A;  . INGUINAL HERNIA REPAIR Right 12/22/2014   Procedure: OPEN RIGHT INGUINAL HERNIA REPAIR WITH MESH;  Surgeon: Axel Filler, MD;  Location: [facility] ORS;  Service: General;  Laterality: Right;  . INSERTION OF MESH N/A 12/22/2014   Procedure: INSERTION OF MESH;  Surgeon: Axel Filler, MD;  Location: [facility] ORS;  Service: General;  Laterality: N/A;  . KNEE ARTHROSCOPY Right   . TEE WITHOUT CARDIOVERSION N/A 09/22/2013   Procedure: TRANSESOPHAGEAL ECHOCARDIOGRAM (TEE);  Surgeon: Lars Masson, MD;  Location: [facility] ENDOSCOPY;  Service: Cardiovascular;  Laterality: N/A;    Allergies  Allergen Reactions  . Niaspan [Niacin Er] Anaphylaxis    I have reviewed the patient's current medications . atorvastatin  10 mg Oral QHS  . diltiazem (CARDIZEM CD) 24 hr capsule 360 mg  360 mg Oral Daily  . doxazosin  4 mg Oral Daily  . famotidine  20 mg Oral Daily  . furosemide  60 mg Intravenous Q12H  . levalbuterol  0.63 mg Nebulization Q8H  . methylPREDNISolone (SOLU-MEDROL) injection  60 mg Intravenous Q12H  . metoprolol succinate  100 mg Oral Daily  . mometasone-formoterol  2 puff Inhalation BID  . Rivaroxaban  15 mg Oral Q supper  . sodium chloride flush  3 mL Intravenous Q12H     sodium chloride, acetaminophen, dextromethorphan-guaiFENesin, ondansetron (ZOFRAN) IV, sodium chloride flush  Prior to Admission medications   Medication Sig Start Date End Date Taking? Authorizing Provider  atorvastatin (LIPITOR) 10 MG tablet Take 10 mg by mouth at bedtime.    Yes Historical Provider, MD  budesonide-formoterol (SYMBICORT) 80-4.5 MCG/ACT inhaler Inhale 2 puffs into the lungs 2 (two) times daily. 06/05/16  Yes Nyoka Cowden, MD  dextromethorphan-guaiFENesin [facility] DM) 30-600 MG 12hr tablet Take 1 tablet by mouth 2 (two) times daily as needed.    Yes Historical Provider, MD  doxazosin (CARDURA) 4 MG tablet Take 1 tablet (4 mg total) by mouth daily. 05/30/16  Yes Donita Brooks, MD  furosemide (LASIX) 40 MG tablet TAKE 1 TABLET (40 MG TOTAL) BY MOUTH DAILY. 03/21/16  Yes Donita Brooks, MD  metoprolol succinate (TOPROL-XL) 100 MG 24 hr tablet Take 1 tablet (100 mg total) by mouth daily. Take with or immediately following a meal. 04/06/16  Yes Donita Brooks, MD  OXYGEN Use 2 L at bedtime and as needed for shortness of breath and activity   Yes Historical Provider, MD  ranitidine (ZANTAC) 150 MG capsule Take 150 mg by mouth daily as needed for heartburn.   Yes Historical Provider, MD  Rivaroxaban (XARELTO) 15 MG TABS tablet Take 1 tablet (15 mg total) by mouth daily with supper. 01/10/16  Yes Donita Brooks, MD  VENTOLIN HFA 108 (90 Base) MCG/ACT inhaler INHALE 2 PUFFS INTO THE LUNGS EVERY 6 (SIX) HOURS AS NEEDED FOR WHEEZING OR SHORTNESS OF BREATH. Patient taking differently: INHALE 2 PUFFS INTO THE LUNGS EVERY 4 HOURS AS NEEDED FOR WHEEZING OR SHORTNESS OF BREATH Plan B 11/29/15  Yes Donita Brooks, MD  diltiazem [facility]) 180 MG 24 hr capsule TAKE 2 CAPSULES BY MOUTH EVERY DAY 07/18/16   Donita Brooks, MD  Simethicone (GAS-X PO) Take by mouth. Use as needed for gas    Historical Provider, MD     Social History   Social History  . Marital status: Married    Spouse name: Nicole Cella  . Number of children: 4  . Years of education: N/A   Occupational History  . Works PT as a Education administrator    Social History Main Topics  . Smoking status: Former Smoker    Packs/day: 1.00    Years: 56.00    Types: Cigarettes    Quit date: 07/31/2013  . Smokeless tobacco: Former Neurosurgeon    Types: Chew  . Alcohol use No  . Drug use: No  . Sexual activity: Not Currently   Other Topics Concern  . Not on file   Social History  Narrative   Married.  Lives with wife.  Ambulates without assistance.    Family Status  Relation Status  . Mother Deceased at age 32  . Father Deceased at age 75  . Son Alive   Family History  Problem Relation Age of Onset  . Diabetes Mother   . Tuberculosis Mother   . Heart disease Father   . Heart attack Father   . Heart disease Son     Blood clot:  Arm      ROS:  Full 14 point review of systems complete and found to be negative unless listed above.  Physical Exam: Blood pressure (!) 180/99, pulse 68, temperature 97.6 F (36.4 C), temperature source Oral, resp. rate 18, height 5' 6.5" (1.689 m), weight 148 lb (67.1 kg), SpO2 93 %.  General: Well developed, well nourished, male in no acute distress on 4 L oxygen Head: Eyes PERRLA, No xanthomas. Normocephalic and atraumatic, oropharynx without edema or exudate.  Lungs: Resp regular and unlabored, faint rales Heart: Irregular no s3, s4, or murmurs..   Neck: No carotid bruits. No lymphadenopathy. +  JVD. Abdomen: Bowel sounds present, tight abdomen Msk:  No spine or cva tenderness. No weakness, no joint deformities or effusions. Extremities: No clubbing, cyanosis. 1-2+ BL LE edema. DP/PT/Radials 2+ and equal bilaterally. Neuro: Alert and oriented X 3. No focal deficits noted. Psych:  Good affect, responds appropriately Skin: No rashes or lesions noted.  Labs:   Lab Results  Component Value Date   WBC 6.1 07/17/2016   HGB 11.8 (L) 07/17/2016   HCT 37.2 (L) 07/17/2016   MCV 93.2 07/17/2016   PLT 128 (L) 07/17/2016   No results for input(s): INR in the last 72 hours.  Recent Labs Lab 07/18/16 0349  NA 136  K 5.1  CL 104  CO2 24  BUN 51*  CREATININE 3.43*  CALCIUM 9.1  GLUCOSE 210*   Magnesium  Date Value Ref Range Status  07/17/2016 2.2 1.7 - 2.4 mg/dL Final   No results for input(s): CKTOTAL, CKMB, TROPONINI in the last 72 hours. No results for input(s): TROPIPOC in the last 72 hours. Pro B Natriuretic  peptide (BNP)  Date/Time Value Ref Range Status  05/08/2016 12:12 PM 1,079.0 (H)  0.0 - 100.0 pg/mL Final  01/21/2015 12:14 PM 765.0 (H) 0.0 - 100.0 pg/mL Final   Lab Results  Component Value Date   CHOL 123 (L) 04/20/2016   HDL 44 04/20/2016   LDLCALC 56 04/20/2016   TRIG 113 04/20/2016   TSH  Date/Time Value Ref Range Status  05/08/2016 12:12 PM 0.45 0.35 - 4.50 uIU/mL Final   Vitamin B-12  Date/Time Value Ref Range Status  03/30/2016 11:55 AM 629 200 - 1,100 pg/mL Final   Folate  Date/Time Value Ref Range Status  03/30/2016 11:55 AM 16.7 >5.4 ng/mL Final    Comment:    Reference Range >17 years:   Low: <3.4 ng/mL              Borderline: 3.4-5.4 ng/mL              Normal: >5.4 ng/mL      Ferritin  Date/Time Value Ref Range Status  07/10/2016 09:58 AM 78.2 22.0 - 322.0 ng/mL Final   TIBC  Date/Time Value Ref Range Status  03/30/2016 11:55 AM 246 (L) 250 - 425 ug/dL Final   Iron  Date/Time Value Ref Range Status  07/10/2016 09:58 AM 54 42 - 165 ug/dL Final   Retic Ct Pct  Date/Time Value Ref Range Status  03/30/2016 11:55 AM 1.1 % Final    Echo: 04/26/16 Indications:      Dyspnea (R06.00).  ------------------------------------------------------------------- History:   PMH:   Dyspnea.  Atrial fibrillation.  Congestive heart failure.  Chronic obstructive pulmonary disease.  Risk factors: AAA. CKD stage 4. Former tobacco use. Hypertension. Dyslipidemia.   ------------------------------------------------------------------- Study Conclusions  - Left ventricle: The cavity size was normal. Wall thickness was   normal. - Ventricular septum: The contour showed diastolic flattening and   systolic flattening. These changes are consistent with RV volume   and pressure overload. - Mitral valve: There was mild regurgitation. - Left atrium: The atrium was mildly dilated. - Right ventricle: The cavity size was moderately dilated. Systolic   function was mildly  reduced. - Right atrium: The atrium was moderately dilated. - Atrial septum: No defect or patent foramen ovale was identified. - Tricuspid valve: There was moderate regurgitation. - Pulmonary arteries: Systolic pressure was moderately to severely   increased. PA peak pressure: 59 mm Hg (S).  Myoview 04/26/16 Study Highlights    Nuclear stress EF: 54%.  There was no ST segment deviation noted during stress.  This is a low risk study.  The left ventricular ejection fraction is mildly decreased (45-54%).  No ischemia was identified.     Radiology:  Dg Chest 2 View  Result Date: 07/18/2016 CLINICAL DATA:  Follow-up right pleural effusion EXAM: CHEST  2 VIEW COMPARISON:  PA and lateral chest x-ray of July 17, 2016 FINDINGS: The lungs are mildly hyperinflated. There small bilateral pleural effusions which are fairly stable. There is no pneumothorax. No pulmonary parenchymal masses or discrete infiltrates are observed. The heart is top-normal in size. The pulmonary vascularity is not engorged. The interstitial markings remain increased. There is calcification in the wall of the aortic arch. IMPRESSION: COPD. Stable small bilateral pleural effusions. Stable mild interstitial prominence diffusely which may reflect mild interstitial edema or acute on chronic bronchitic changes. Electronically Signed   By: David  [place] M.D.   On: 07/18/2016 08:00   Dg Chest 2 View  Result Date: 07/17/2016 CLINICAL DATA:  Shortness of breath. EXAM: CHEST  2 VIEW COMPARISON:  Radiographs of May 08, 2016. FINDINGS: Stable  cardiomediastinal silhouette. Atherosclerosis of thoracic aorta is noted. Mild bibasilar edema or atelectasis is noted with mild bilateral pleural effusions. No pneumothorax is noted. Bony thorax is unremarkable. IMPRESSION: Mild bibasilar edema or atelectasis is noted with mild bilateral pleural effusions. Electronically Signed   By: Lupita RaiderJames  Green Jr, M.D.   On: 07/17/2016 15:23     ASSESSMENT AND PLAN:     1. Acute on chronic diastolic heart failure - Recently requiring higher dose of lasix (80mg  qd) with relapse on regular home dose 40mg  qd. BNP of 915. CXR showed Mild bibasilar edema or atelectasis is noted with mild bilateral pleural effusions. Here on IV lasix 60mg  BID. Diuresed 570cc. Weight down 1lb however Scr increased to 3.43 from 2.98. Not on ACE/ARB due to CKD. Will hold lasix for now. CHF team to follow tomorrow. Need nephrology consult as well.  - Recent echo 04/26/16 as above showed normal LV function with RV volume and pressure overload, PA pressure of 59mm Hg. Concerning for R sided heart failure.   2. Acute on chronic CKD, stage IV - followed by nephrologist Mark [name].   3. AFib/Aflutter - failed recent cardioversion 06/04/16. He has not tolerated amiodarone in the past due to pulmonary issues. His primary cardiologist Dr. Clifton [name] recommended "EP referral after next visit if he is still dyspneic with volume overload despite current strategy".  Continue Xarelto 15mg  for anticoagulation.  CHADSVASCs score of 3. Rate stable on Cardizem CD 360mg  and Toprol XL 100mg  at 60s, however noted intermittent rate of 40s.  Will reduce Cardizem CD to 240mg  qd from tomorrow.   4. Hypertension - Elevated this admission. On CCB and BB as above dose. Will start Norvasc 5mg .   5. HLD - 04/20/2016: Cholesterol 123; HDL 44; LDL Cholesterol 56; Triglycerides 113; VLDL 23  - Continue statin   Signed: Rhilyn Battle, PA 07/18/2016, 3:39 PM Pager [phone]  Co-Sign MD

## 2016-07-19 ENCOUNTER — Encounter (HOSPITAL_COMMUNITY): Payer: Self-pay | Admitting: Nurse Practitioner

## 2016-07-19 ENCOUNTER — Inpatient Hospital Stay (HOSPITAL_COMMUNITY): Payer: PPO

## 2016-07-19 DIAGNOSIS — I481 Persistent atrial fibrillation: Secondary | ICD-10-CM

## 2016-07-19 DIAGNOSIS — I480 Paroxysmal atrial fibrillation: Secondary | ICD-10-CM

## 2016-07-19 DIAGNOSIS — I2721 Secondary pulmonary arterial hypertension: Secondary | ICD-10-CM

## 2016-07-19 DIAGNOSIS — I484 Atypical atrial flutter: Secondary | ICD-10-CM

## 2016-07-19 DIAGNOSIS — R0600 Dyspnea, unspecified: Secondary | ICD-10-CM

## 2016-07-19 DIAGNOSIS — I2609 Other pulmonary embolism with acute cor pulmonale: Secondary | ICD-10-CM

## 2016-07-19 LAB — BASIC METABOLIC PANEL
ANION GAP: 8 (ref 5–15)
BUN: 61 mg/dL — AB (ref 6–20)
CHLORIDE: 105 mmol/L (ref 101–111)
CO2: 23 mmol/L (ref 22–32)
Calcium: 9.2 mg/dL (ref 8.9–10.3)
Creatinine, Ser: 3.58 mg/dL — ABNORMAL HIGH (ref 0.61–1.24)
GFR calc Af Amer: 18 mL/min — ABNORMAL LOW (ref >60)
GFR, EST NON AFRICAN AMERICAN: 16 mL/min — AB (ref >60)
GLUCOSE: 148 mg/dL — AB (ref 65–99)
POTASSIUM: 5.2 mmol/L — AB (ref 3.5–5.1)
Sodium: 136 mmol/L (ref 135–145)

## 2016-07-19 LAB — ECHOCARDIOGRAM LIMITED
Height: 66.5 in
Weight: 2356.8 oz

## 2016-07-19 LAB — PROCALCITONIN: Procalcitonin: <0.1 ng/mL

## 2016-07-19 MED ORDER — PREDNISONE 20 MG PO TABS
20.0000 mg | ORAL_TABLET | Freq: Every day | ORAL | Status: DC
  Administered 2016-07-20: 20 mg via ORAL
  Filled 2016-07-19: qty 1

## 2016-07-19 MED ORDER — HYDROXYZINE HCL 25 MG PO TABS: 25.0000 mg | ORAL_TABLET | Freq: Three times a day (TID) | ORAL | Status: DC | PRN

## 2016-07-19 MED ORDER — FUROSEMIDE 10 MG/ML IJ SOLN
40.0000 mg | Freq: Every day | INTRAMUSCULAR | Status: DC
  Administered 2016-07-19 – 2016-07-21 (×3): 40 mg via INTRAVENOUS
  Filled 2016-07-19 (×3): qty 4

## 2016-07-19 NOTE — Consult Note (Signed)
Reason for Consult: Acute kidney injury on chronic kidney disease stage IV Referring Physician: Hazeline Junkeryan Grunz M.D. [facility])  HPI: 70 year old Caucasian man with past medical history significant for chronic kidney disease stage IV (baseline GFR around 25 mL/minute-creatinine 2.5-2.8), AAA status post repair, hypertension, moderate to severe pulmonary hypertension and chronic diastolic heart failure who presented to the hospital with worsening shortness of breath and cough that did not improve even after up-titration of furosemide at home. He was found to be in decompensated diastolic congestive heart failure with chronic atrial flutter. He was treated with furosemide 60 mg IV twice a day was discontinued yesterday after noticing a rise of his creatinine.  He reports an isolated episode of nausea and vomiting earlier in the week after eating oysters stew. Denies any diarrhea or NSAID use. Denies any recent antibiotic or iodinated intravenous contrast exposure. He reports to still be having some shortness of breath and increased abdominal distention.   07/17/2016  07/18/2016  07/19/2016   BUN 44 (H) 51 (H) 61 (H)  Creatinine 2.98 (H) 3.43 (H) 3.58 (H)    Past Medical History:  Diagnosis Date  . AAA (abdominal aortic aneurysm) (HCC)    a. 09/2013: Resection and grafting of abdominal aortic aneurysm with insertion of an aorto by common iliac graft using 18 x 9 mm Hemashield Dacron graft.  . Arthritis    "little in my hands" (09/08/2013)  . Chronic diastolic CHF (congestive heart failure) (HCC)    a.  Echo (08/13/2013): EF 55-60%, normal wall motion, mild MR, mild LAE  . Chronic kidney disease (CKD), stage III (moderate)   . COPD (chronic obstructive pulmonary disease) (HCC)   . 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 GERD (gastroesophageal reflux disease)   . H/O hiatal hernia   . Hx of cardiovascular stress test    a. Myoview 09/2012:  no scar or ischemia  . Hyperlipidemia   .  Hypertension    Sees Dr. Lynnea FerrierWarren Pickard  . On home oxygen therapy    "2L @ night" (07/17/2016)  . PAF (paroxysmal atrial fibrillation) (HCC)    a. amiodarone Rx started 08/2013;  b. s/p TEE-DCCV 09/2013 => recurrent AFib => repeat DCCV => NSR;   c. Xarelto 15 QD due to CKD  . Pneumonia   . Pulmonary hypertension associated with unclear multi-factorial mechanisms    Only on echo (8/17).      Past Surgical History:  Procedure Laterality Date  . ABDOMINAL AORTIC ANEURYSM REPAIR  10/15/2012   Procedure: ANEURYSM ABDOMINAL AORTIC REPAIR;  Surgeon: Pryor OchoaJames D Lawson, MD;  [facility];  Service: Vascular;  Laterality: N/A;  Resection and Grafting of Abdominal Aortic Aneurysm - Aortobi-Iliac   . ABDOMINAL AORTIC ANEURYSM REPAIR  10-15-2012  . CARDIOVERSION N/A 09/22/2013   Procedure: CARDIOVERSION;  Surgeon: Lars MassonKatarina H Nelson, MD;  [facility];  Service: Cardiovascular;  Laterality: N/A;  . CARDIOVERSION  Jan. 12, 2015   Dr. Doylene CanningGregg W. Ladona Ridgelaylor  . CARDIOVERSION N/A 09/28/2013   Procedure: CARDIOVERSION;  Surgeon: Marinus MawGregg W Taylor, MD;  [facility];  Service: Cardiovascular;  Laterality: N/A;  . CARDIOVERSION N/A 06/04/2016   Procedure: CARDIOVERSION;  Surgeon: Lars MassonKatarina H Nelson, MD;  [facility];  Service: Cardiovascular;  Laterality: N/A;  . INGUINAL HERNIA REPAIR Right 12/22/2014   Procedure: OPEN RIGHT INGUINAL HERNIA REPAIR WITH MESH;  Surgeon: Axel FillerArmando Ramirez, MD;  Location: [facility] ORS;  Service: General;  Laterality: Right;  . INSERTION OF  MESH N/A 12/22/2014   Procedure: INSERTION OF MESH;  Surgeon: Axel Filler, MD;  Location: [facility] ORS;  Service: General;  Laterality: N/A;  . KNEE ARTHROSCOPY Right   . TEE WITHOUT CARDIOVERSION N/A 09/22/2013   Procedure: TRANSESOPHAGEAL ECHOCARDIOGRAM (TEE);  Surgeon: Lars Masson, MD;  Location: [facility] ENDOSCOPY;  Service: Cardiovascular;  Laterality: N/A;    Family History  Problem Relation Age of Onset  . Diabetes Mother   .  Tuberculosis Mother   . Heart disease Father   . Heart attack Father   . Heart disease Son     Blood clot:  Arm    Social History:  reports that he quit smoking about 2 years ago. His smoking use included Cigarettes. He has a 56.00 pack-year smoking history. He has quit using smokeless tobacco. His smokeless tobacco use included Chew. He reports that he does not drink alcohol or use drugs.  Allergies:  Allergies  Allergen Reactions  . Niaspan [Niacin Er] Anaphylaxis  . Amiodarone     Stopped due to lung issues    Medications:  Scheduled: . amLODipine  5 mg Oral Daily  . atorvastatin  10 mg Oral QHS  . diltiazem  240 mg Oral Daily  . doxazosin  4 mg Oral Daily  . famotidine  20 mg Oral Daily  . levalbuterol  0.63 mg Nebulization Q8H  . metoprolol succinate  100 mg Oral Daily  . mometasone-formoterol  2 puff Inhalation BID  . [START ON 07/20/2016] predniSONE  20 mg Oral Q breakfast  . Rivaroxaban  15 mg Oral Q supper  . sodium chloride flush  3 mL Intravenous Q12H    CBC Latest Ref Rng & Units 07/17/2016 07/10/2016 05/30/2016  WBC 4.0 - 10.5 K/uL 6.1 7.3 5.9  Hemoglobin 13.0 - 17.0 g/dL 11.8(L) 12.2(L) 12.4(L)  Hematocrit 39.0 - 52.0 % 37.2(L) 37.0(L) 37.2(L)  Platelets 150 - 400 K/uL 128(L) 159.0 167   BMP Latest Ref Rng & Units 07/19/2016 07/18/2016 07/17/2016  Glucose 65 - 99 mg/dL 161(W) 960(A) 540(J)  BUN 6 - 20 mg/dL 81(X) 91(Y) 78(G)  Creatinine 0.61 - 1.24 mg/dL 9.56(O) 1.30(Q) 6.57(Q)  BUN/Creat Ratio 10 - 22 - - -  Sodium 135 - 145 mmol/L 136 136 138  Potassium 3.5 - 5.1 mmol/L 5.2(H) 5.1 4.7  Chloride 101 - 111 mmol/L 105 104 106  CO2 22 - 32 mmol/L 23 24 23   Calcium 8.9 - 10.3 mg/dL 9.2 9.1 9.0     Dg Chest 2 View  Result Date: 07/18/2016 CLINICAL DATA:  Follow-up right pleural effusion EXAM: CHEST  2 VIEW COMPARISON:  PA and lateral chest x-ray of July 17, 2016 FINDINGS: The lungs are mildly hyperinflated. There small bilateral pleural effusions which  are fairly stable. There is no pneumothorax. No pulmonary parenchymal masses or discrete infiltrates are observed. The heart is top-normal in size. The pulmonary vascularity is not engorged. The interstitial markings remain increased. There is calcification in the wall of the aortic arch. IMPRESSION: COPD. Stable small bilateral pleural effusions. Stable mild interstitial prominence diffusely which may reflect mild interstitial edema or acute on chronic bronchitic changes. Electronically Signed   By: David  Swaziland M.D.   On: 07/18/2016 08:00   Dg Chest 2 View  Result Date: 07/17/2016 CLINICAL DATA:  Shortness of breath. EXAM: CHEST  2 VIEW COMPARISON:  Radiographs of May 08, 2016. FINDINGS: Stable cardiomediastinal silhouette. Atherosclerosis of thoracic aorta is noted. Mild bibasilar edema or atelectasis is noted with mild bilateral pleural  effusions. No pneumothorax is noted. Bony thorax is unremarkable. IMPRESSION: Mild bibasilar edema or atelectasis is noted with mild bilateral pleural effusions. Electronically Signed   By: Lupita RaiderJames  Green Jr, M.D.   On: 07/17/2016 15:23    Review of Systems  Constitutional: Positive for fever and malaise/fatigue. Negative for chills.       Reports some subjective fevers earlier in the week  HENT: Negative.   Eyes: Negative.   Respiratory: Positive for cough and shortness of breath. Negative for hemoptysis and sputum production.   Cardiovascular: Positive for palpitations, orthopnea and leg swelling.  Gastrointestinal: Positive for nausea and vomiting. Negative for abdominal pain and diarrhea.       Reports one episode of nausea and vomiting last week  Genitourinary: Negative.   Musculoskeletal: Negative.   Skin: Negative.   Neurological: Negative.   Endo/Heme/Allergies: Negative.   Psychiatric/Behavioral: Negative.    Blood pressure (!) 145/78, pulse (!) 58, temperature 97.5 F (36.4 C), temperature source Oral, resp. rate 18, height 5' 6.5" (1.689 m),  weight 66.8 kg (147 lb 4.8 oz), SpO2 93 %. Physical Exam  Nursing note and vitals reviewed. Constitutional: He is oriented to person, place, and time. He appears well-developed and well-nourished.  HENT:  Head: Normocephalic and atraumatic.  Nose: Nose normal.  Eyes: EOM are normal. Pupils are equal, round, and reactive to light. No scleral icterus.  Neck: Normal range of motion. Neck supple. JVD present.  8-9cm JVD  Cardiovascular: Normal rate and normal heart sounds.   No murmur heard. Irregularly irregular rhythm  Respiratory: Effort normal. He has no wheezes. He has no rales.  Decreased breath sounds over bases-no obvious rales or rhonchi  GI: Soft. Bowel sounds are normal. He exhibits distension. There is no tenderness. There is no rebound.  Exam consistent with ascites  Musculoskeletal: Normal range of motion. He exhibits edema.  1+ bipedal edema  Neurological: He is alert and oriented to person, place, and time.  Skin: Skin is warm and dry. No rash noted.    Assessment/Plan: 1. Acute kidney injury and chronic kidney disease stage IV: Appears to be hemodynamically mediated in the setting of CHF exacerbation-unfortunately previous workup of his chronic kidney disease and consistent with chronic cardiorenal syndrome with diastolic heart failure and extensive vascular disease including history of AAA status post repair. On physical exam, he has mild volume excess and agree with the limitation of aggressiveness of diuretic therapy to prevent further renal compromise. At home he takes low-dose furosemide 40 mg daily it would not be unreasonable to place him on intravenous furosemide 40 mg a day at this time. Based on his physical exam, will order for abdominal ultrasound to determine extent of ascites as paracentesis may help deviate intra-abdominal pressure and its compromise on his respiratory status/renal perfusion. 2. Hyperkalemia: Mild and likely associated with acute on chronic renal  failure. Monitor with ongoing diuretic therapy. There are no acute indication for hemodialysis at this point. 3. Hypertension: Blood pressures appear to be marginally elevated, continue current management and re-dose diuretics 4. Acute CHF exacerbation: Ongoing further evaluation and management of heart failure service and may need repeat echocardiogram versus right heart catheterization  Krislyn Donnan K. 07/19/2016, 12:11 PM

## 2016-07-19 NOTE — Consult Note (Addendum)
ELECTROPHYSIOLOGY CONSULT NOTE    Patient ID: Mark [name] MRN: [id number], DOB/AGE: 10/11/1945 70 y.o.  Admit date: 07/17/2016 Date of Consult: 07/19/2016  Primary Physician: Leo Grosser, MD Primary Cardiologist: Clifton Teja Judice Requesting MD: AHF team  Reason for Consultation: atrial flutter  HPI:  Mark [name] is a 70 y.o. male with a past medical history significant for chronic diastolic heart failure, COPD on home O2, atrial fibrillation/flutter (previously on amiodarone but d/c'd 2/2 lung issues), CKD stage IV, AAA s/p repair, and pulmonary hypertension.  He was first diagnosed with atrial fibrillation in 2014 and was placed on amiodarone at that time for rhythm control. He did well post cardioversion maintaining SR until last month when he was found to be in atrial flutter after discontinuing amiodarone 2/2 concerns from pulmonary standpoint.  He was admitted on 07/17/16 with shortness of breath and rate controlled atrial flutter.  He has been seen by the advanced heart failure team who has asked EP to evaluate for treatment options.   He has been found to have recurrent pleural effusions as well as worsening renal function with diuresis - nephrology has been consulted.   He currently states that his breathing is improved but not at baseline. He is concerned about his renal function. He denies chest pain, syncope, dizziness, or pre-syncope.   Past Medical History:  Diagnosis Date  . AAA (abdominal aortic aneurysm) (HCC)    a. 09/2013: Resection and grafting of abdominal aortic aneurysm with insertion of an aorto by common iliac graft using 18 x 9 mm Hemashield Dacron graft.  . Arthritis    "little in my hands" (09/08/2013)  . Chronic diastolic CHF (congestive heart failure) (HCC)    a.  Echo (08/13/2013): EF 55-60%, normal wall motion, mild MR, mild LAE  . Chronic kidney disease (CKD), stage III (moderate)   . COPD (chronic obstructive pulmonary disease) (HCC)   .  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 GERD (gastroesophageal reflux disease)   . H/O hiatal hernia   . Hx of cardiovascular stress test    a. Myoview 09/2012:  no scar or ischemia  . Hyperlipidemia   . Hypertension   . On home oxygen therapy    "2L @ night" (07/17/2016)  . PAF (paroxysmal atrial fibrillation) (HCC)    a. amiodarone Rx started 08/2013;  b. s/p TEE-DCCV 09/2013 => recurrent AFib => repeat DCCV => NSR;   c. Xarelto 15 QD due to CKD  . Pulmonary hypertension associated with unclear multi-factorial mechanisms    Only on echo (8/17).       Surgical History:  Past Surgical History:  Procedure Laterality Date  . ABDOMINAL AORTIC ANEURYSM REPAIR  10/15/2012   Procedure: ANEURYSM ABDOMINAL AORTIC REPAIR;  Surgeon: Pryor Ochoa, MD;  Location: [facility] OR;  Service: Vascular;  Laterality: N/A;  Resection and Grafting of Abdominal Aortic Aneurysm - Aortobi-Iliac   . ABDOMINAL AORTIC ANEURYSM REPAIR  10-15-2012  . CARDIOVERSION N/A 09/22/2013   Procedure: CARDIOVERSION;  Surgeon: Lars Masson, MD;  Location: [facility] ENDOSCOPY;  Service: Cardiovascular;  Laterality: N/A;  . CARDIOVERSION  Jan. 12, 2015   Dr. Doylene Canning. Ladona Ridgel  . CARDIOVERSION N/A 09/28/2013   Procedure: CARDIOVERSION;  Surgeon: Marinus Maw, MD;  Location: [facility] CATH LAB;  Service: Cardiovascular;  Laterality: N/A;  . CARDIOVERSION N/A 06/04/2016   Procedure: CARDIOVERSION;  Surgeon: Lars Masson, MD;  Location: [facility] ENDOSCOPY;  Service: Cardiovascular;  Laterality: N/A;  .  INGUINAL HERNIA REPAIR Right 12/22/2014   Procedure: OPEN RIGHT INGUINAL HERNIA REPAIR WITH MESH;  Surgeon: Axel FillerArmando Ramirez, MD;  Location: [facility] ORS;  Service: General;  Laterality: Right;  . INSERTION OF MESH N/A 12/22/2014   Procedure: INSERTION OF MESH;  Surgeon: Axel FillerArmando Ramirez, MD;  Location: [facility] ORS;  Service: General;  Laterality: N/A;  . KNEE ARTHROSCOPY Right   . TEE WITHOUT CARDIOVERSION N/A 09/22/2013   Procedure: TRANSESOPHAGEAL  ECHOCARDIOGRAM (TEE);  Surgeon: Lars MassonKatarina H Nelson, MD;  [facility];  Service: Cardiovascular;  Laterality: N/A;     Prescriptions Prior to Admission  Medication Sig Dispense Refill Last Dose  . atorvastatin (LIPITOR) 10 MG tablet Take 10 mg by mouth at bedtime.    07/16/2016 at Unknown time  . budesonide-formoterol (SYMBICORT) 80-4.5 MCG/ACT inhaler Inhale 2 puffs into the lungs 2 (two) times daily. 1 Inhaler 0 07/17/2016 at Unknown time  . dextromethorphan-guaiFENesin (MUCINEX DM) 30-600 MG 12hr tablet Take 1 tablet by mouth 2 (two) times daily as needed.    Past Week at Unknown time  . doxazosin (CARDURA) 4 MG tablet Take 1 tablet (4 mg total) by mouth daily. 90 tablet 3 07/17/2016 at Unknown time  . furosemide (LASIX) 40 MG tablet TAKE 1 TABLET (40 MG TOTAL) BY MOUTH DAILY. 30 tablet 5 07/17/2016 at Unknown time  . metoprolol succinate (TOPROL-XL) 100 MG 24 hr tablet Take 1 tablet (100 mg total) by mouth daily. Take with or immediately following a meal. 90 tablet 3 07/17/2016 at 8am  . OXYGEN Use 2 L at bedtime and as needed for shortness of breath and activity   07/17/2016 at Unknown time  . ranitidine (ZANTAC) 150 MG capsule Take 150 mg by mouth daily as needed for heartburn.   Past Week at Unknown time  . Rivaroxaban (XARELTO) 15 MG TABS tablet Take 1 tablet (15 mg total) by mouth daily with supper. 30 tablet 11 07/16/2016 at 6pm  . VENTOLIN HFA 108 (90 Base) MCG/ACT inhaler INHALE 2 PUFFS INTO THE LUNGS EVERY 6 (SIX) HOURS AS NEEDED FOR WHEEZING OR SHORTNESS OF BREATH. (Patient taking differently: INHALE 2 PUFFS INTO THE LUNGS EVERY 4 HOURS AS NEEDED FOR WHEEZING OR SHORTNESS OF BREATH Plan B) 18 g 5 07/17/2016 at Unknown time  . Simethicone (GAS-X PO) Take by mouth. Use as needed for gas   Not Taking at Unknown time    Inpatient Medications:  . amLODipine  5 mg Oral Daily  . atorvastatin  10 mg Oral QHS  . diltiazem  240 mg Oral Daily  . doxazosin  4 mg Oral Daily  .  famotidine  20 mg Oral Daily  . furosemide  40 mg Intravenous Daily  . levalbuterol  0.63 mg Nebulization Q8H  . metoprolol succinate  100 mg Oral Daily  . mometasone-formoterol  2 puff Inhalation BID  . [START ON 07/20/2016] predniSONE  20 mg Oral Q breakfast  . Rivaroxaban  15 mg Oral Q supper  . sodium chloride flush  3 mL Intravenous Q12H    Allergies:  Allergies  Allergen Reactions  . Niaspan [Niacin Er] Anaphylaxis  . Amiodarone     Stopped due to lung issues    Social History   Social History  . Marital status: Married    Spouse name: [name]  . Number of children: 4  . Years of education: N/A   Occupational History  . Works PT as a Education administratorpainter    Social History Main Topics  . Smoking status: Former  Smoker    Packs/day: 1.00    Years: 56.00    Types: Cigarettes    Quit date: 07/31/2013  . Smokeless tobacco: Former Neurosurgeon    Types: Chew  . Alcohol use No  . Drug use: No  . Sexual activity: Not Currently   Other Topics Concern  . Not on file   Social History Narrative   Married.  Lives with wife.  Ambulates without assistance.     Family History  Problem Relation Age of Onset  . Diabetes Mother   . Tuberculosis Mother   . Heart disease Father   . Heart attack Father   . Heart disease Son     Blood clot:  Arm     Review of Systems: All other systems reviewed and are otherwise negative except as noted above.  Physical Exam: Vitals:   07/18/16 1959 07/19/16 0032 07/19/16 0430 07/19/16 0848  BP: (!) 141/72 (!) 145/75 (!) 145/78   Pulse: 64 61 (!) 58   Resp: 18 18 18    Temp: 98.3 F (36.8 C) 97.5 F (36.4 C) 97.5 F (36.4 C)   TempSrc: Oral Oral Oral   SpO2: 90% 92% 91% 93%  Weight:   147 lb 4.8 oz (66.8 kg)   Height:        GEN- The patient is elderly and chronically ill appearing, alert and oriented x 3 today.   HEENT: normocephalic, atraumatic; sclera clear, conjunctiva pink; hearing intact; oropharynx clear; neck supple  Lungs- Clear to  ausculation bilaterally, normal work of breathing.  No wheezes, rales, rhonchi Heart- Irregular rate and rhythm  GI- soft, non-tender, non-distended, bowel sounds present  Extremities- no clubbing, cyanosis, 1+ BLE edema MS- no significant deformity or atrophy Skin- warm and dry, no rash or lesion Psych- euthymic mood, full affect Neuro- strength and sensation are intact  Labs:   Lab Results  Component Value Date   WBC 6.1 07/17/2016   HGB 11.8 (L) 07/17/2016   HCT 37.2 (L) 07/17/2016   MCV 93.2 07/17/2016   PLT 128 (L) 07/17/2016     Recent Labs Lab 07/19/16 0444  NA 136  K 5.2*  CL 105  CO2 23  BUN 61*  CREATININE 3.58*  CALCIUM 9.2  GLUCOSE 148*      Radiology/Studies: Dg Chest 2 View Result Date: 07/18/2016 CLINICAL DATA:  Follow-up right pleural effusion EXAM: CHEST  2 VIEW COMPARISON:  PA and lateral chest x-ray of July 17, 2016 FINDINGS: The lungs are mildly hyperinflated. There small bilateral pleural effusions which are fairly stable. There is no pneumothorax. No pulmonary parenchymal masses or discrete infiltrates are observed. The heart is top-normal in size. The pulmonary vascularity is not engorged. The interstitial markings remain increased. There is calcification in the wall of the aortic arch. IMPRESSION: COPD. Stable small bilateral pleural effusions. Stable mild interstitial prominence diffusely which may reflect mild interstitial edema or acute on chronic bronchitic changes. Electronically Signed   By: David  Swaziland M.D.   On: 07/18/2016 08:00    EKG: atypical atrial flutter, rate 63  TELEMETRY: rate controlled atrial flutter  Assessment/Plan: 1.  Atypical atrial flutter/atrial fibrillation Unfortunately with intolerance of amiodarone, lung disease, and CKD AAD options are limited. Currently rate controlled.  He thinks that his functional status and respiratory status were unchanged from earlier this summer when he was in SR to when he saw Dr Clifton   last month and was found to be in recurrent atrial flutter.  On Xarelto 15mg  daily for  CHADS2VASC of 4 (CrCl 17 by labs today).  If renal function worsens, may need to change to Warfarin Consider reducing rate control if heart rates not adequate with exertion  2.  Acute on chronic diastolic heart failure Management per AHF team  3.  CKD, stage IV Management per nephrology  Dr Johney FrameAllred to see later today    Signed, Gypsy BalsamAmber Seiler, NP 07/19/2016 12:43 PM  I have seen, examined the patient, and reviewed the above assessment and plan.  On exam, frail and appears older than stated age.  Changes to above are made where necessary.  He has likely cor pulmonale with severe pulmonary hypertension, RV dysfunction, severe TR, and severe RA enlargement.  His atrial arrhythmias are secondary to this and are unlikely to be controlled long term.  Would most likely be best served with rate control going forward.  Given renal failure, he is not a candidate for tikosyn or sotalol.  Amiodarone has been stopped due to respiratory symptoms.  Could consider flecainide as he does not have known CAD.  Would not advise ablation for this patient.  Once CrCl <15, should switch xarelto to coumadin.  Prognosis is quite poor.  Electrophysiology team to see as needed while here. Please call with questions.   Co Sign: Hillis RangeJames Kirstein Baxley, MD 07/19/2016 9:36 PM

## 2016-07-19 NOTE — Progress Notes (Signed)
Advanced Heart Failure Rounding Note   Subjective:    Admitted with shortness of breath. On admit he was in A flutter and volume overloaded. Diuresing with IV lasix. Creatinine trending up.   SOB with exertion. Denies sycope/presyncope  Echo 04/26/16 with normal LV function, RV volume and pressure overload, PA pressure of 59mm Hg. Stress myoview 04/26/16 with no ischemia Objective:   Weight Range:  Vital Signs:   Temp:  [97.5 F (36.4 C)-98.3 F (36.8 C)] 97.5 F (36.4 C) (11/02 0430) Pulse Rate:  [55-68] 58 (11/02 0430) Resp:  [18] 18 (11/02 0430) BP: (141-180)/(72-99) 145/78 (11/02 0430) SpO2:  [83 %-93 %] 93 % (11/02 0848) Weight:  [147 lb 4.8 oz (66.8 kg)] 147 lb 4.8 oz (66.8 kg) (11/02 0430) Last BM Date: 07/17/16  Weight change: Filed Weights   07/17/16 2106 07/18/16 0458 07/19/16 0430  Weight: 148 lb 14.4 oz (67.5 kg) 148 lb (67.1 kg) 147 lb 4.8 oz (66.8 kg)    Intake/Output:   Intake/Output Summary (Last 24 hours) at 07/19/16 1107 Last data filed at 07/19/16 0842  Gross per 24 hour  Intake             1200 ml  Output             1150 ml  Net               50 ml     Physical Exam: General:  Chronically ill apeparing  No resp difficulty. Sitting on the side of the bed HEENT: normal Neck: supple. JVP jaw  . Carotids 2+ bilat; no bruits. No lymphadenopathy or thryomegaly appreciated. Cor: PMI nondisplaced. Irregular rate & rhythm. 2/6 TR RV heve Lungs: very poor air movement  on 2 liters oxygen Abdomen: soft, nontender, nondistended. No hepatosplenomegaly. No bruits or masses. Good bowel sounds. Extremities: no cyanosis, clubbing, rash, R and LLE 2+ edema  Neuro: alert & orientedx3, cranial nerves grossly intact. moves all 4 extremities w/o difficulty. Affect pleasant  Telemetry:  A flutter 50s   Labs: Basic Metabolic Panel:  Recent Labs Lab 07/17/16 1302 07/17/16 2113 07/18/16 0349 07/19/16 0444  NA 138  --  136 136  K 4.7  --  5.1 5.2*  CL 106   --  104 105  CO2 23  --  24 23  GLUCOSE 176*  --  210* 148*  BUN 44*  --  51* 61*  CREATININE 2.98*  --  3.43* 3.58*  CALCIUM 9.0  --  9.1 9.2  MG  --  2.2  --   --     Liver Function Tests: No results for input(s): AST, ALT, ALKPHOS, BILITOT, PROT, ALBUMIN in the last 168 hours. No results for input(s): LIPASE, AMYLASE in the last 168 hours. No results for input(s): AMMONIA in the last 168 hours.  CBC:  Recent Labs Lab 07/17/16 1302  WBC 6.1  HGB 11.8*  HCT 37.2*  MCV 93.2  PLT 128*    Cardiac Enzymes: No results for input(s): CKTOTAL, CKMB, CKMBINDEX, TROPONINI in the last 168 hours.  BNP: BNP (last 3 results)  Recent Labs  07/17/16 1302  BNP 915.0*    ProBNP (last 3 results)  Recent Labs  05/08/16 1212  PROBNP 1,079.0*      Other results:  Imaging: Dg Chest 2 View  Result Date: 07/18/2016 CLINICAL DATA:  Follow-up right pleural effusion EXAM: CHEST  2 VIEW COMPARISON:  PA and lateral chest x-ray of July 17, 2016 FINDINGS:  The lungs are mildly hyperinflated. There small bilateral pleural effusions which are fairly stable. There is no pneumothorax. No pulmonary parenchymal masses or discrete infiltrates are observed. The heart is top-normal in size. The pulmonary vascularity is not engorged. The interstitial markings remain increased. There is calcification in the wall of the aortic arch. IMPRESSION: COPD. Stable small bilateral pleural effusions. Stable mild interstitial prominence diffusely which may reflect mild interstitial edema or acute on chronic bronchitic changes. Electronically Signed   By: David  SwazilandJordan M.D.   On: 07/18/2016 08:00   Dg Chest 2 View  Result Date: 07/17/2016 CLINICAL DATA:  Shortness of breath. EXAM: CHEST  2 VIEW COMPARISON:  Radiographs of May 08, 2016. FINDINGS: Stable cardiomediastinal silhouette. Atherosclerosis of thoracic aorta is noted. Mild bibasilar edema or atelectasis is noted with mild bilateral pleural  effusions. No pneumothorax is noted. Bony thorax is unremarkable. IMPRESSION: Mild bibasilar edema or atelectasis is noted with mild bilateral pleural effusions. Electronically Signed   By: Lupita RaiderJames  Green Jr, M.D.   On: 07/17/2016 15:23      Medications:     Scheduled Medications: . amLODipine  5 mg Oral Daily  . atorvastatin  10 mg Oral QHS  . diltiazem  240 mg Oral Daily  . doxazosin  4 mg Oral Daily  . famotidine  20 mg Oral Daily  . levalbuterol  0.63 mg Nebulization Q8H  . metoprolol succinate  100 mg Oral Daily  . mometasone-formoterol  2 puff Inhalation BID  . [START ON 07/20/2016] predniSONE  20 mg Oral Q breakfast  . Rivaroxaban  15 mg Oral Q supper  . sodium chloride flush  3 mL Intravenous Q12H     Infusions:     PRN Medications:  sodium chloride, acetaminophen, dextromethorphan-guaiFENesin, ondansetron (ZOFRAN) IV, sodium chloride flush   Assessment   1. Dyspnea  2. Acute/Chronic Diastolic Heart Failure- ECHO EF 60% Peak PA pressure 59 mg Hg 3. A flutter 4. PAF- S/P DC-CV 2015 and 2017  5. CKD- Stage IV Plan/Discussion:    Admitted with increased dyspnea and volume overload. On admit he was in A flutter. In the past he has had 3 DC-CV for A fib. Earlier this month he was in A flutter and was referred to EP as an outpatient. Will consult EP.  In the past Amio stopped due to lung issues. Continue bb and diltiazem.   On exam he has mild volume overload with edema noted in his lower extremities. Due to concern for R sided failure and elevated PA pressures from most recent ECHO we will repeat ECHO and he will need RHC to further evaluate hemodynamics. Serum creatinine elevated with diuresis. For now will hold. Place ted hose.   Nephrology consult pending.    Length of Stay: 1   Amy Clegg NP-C 07/19/2016, 11:07 AM  Advanced Heart Failure Team Pager [phone] (M-F; 7a - 4p)  Please contact [facility] for night-coverage after hours (4p -7a ) and weekends on  [email or web address]  Patient seen and examined with Tonye BecketAmy Clegg, NP. We discussed all aspects of the encounter. I agree with the assessment and plan as stated above.   I have reviewed echo images personally.   He has end stage pulmonary and RV failure in the setting of very advanced COPD (FEV1 0.9) - Who GROUP III and CKD 4. 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 He is not a candidate for amio, tikosyn or sotalol due to  various comorbidities but from a symptomatic standpoint I think it would likely help him a lot to be back in NSR (says he has felt much worse past few weeks). I discussed with EP and although not an ideal option flecainide may be best choice.   For now, will diurese. Check CT chest to assess for ILD. Continue Xarelto for possible DC_CV in near future. May need hospice involvement soon.   Lucky Alverson,MD 10:25 PM

## 2016-07-19 NOTE — Progress Notes (Signed)
  Echocardiogram 2D Echocardiogram limited has been performed.  Janalyn HarderWest, Ellia Knowlton R 07/19/2016, 2:24 PM

## 2016-07-19 NOTE — Progress Notes (Signed)
PT Cancellation Note  Patient Details Name: Mark [name] MRN: [id number] DOB: 1946/08/06   Cancelled Treatment:    Reason Eval/Treat Not Completed: Patient at procedure or test/unavailable. Pt currently being transported off of the unit for an ultrasound. PT will continue to f/u with pt as appropriate.    Alessandra BevelsJennifer M Drenda Sobecki 07/19/2016, 3:07 PM Deborah ChalkJennifer Zailen Albarran, PT, DPT [phone]

## 2016-07-19 NOTE — Progress Notes (Signed)
PROGRESS NOTE  Mark [name]  GNF:[id number] DOB: 1946/02/27 DOA: 07/17/2016 PCP: Leo Grosser, MD   Brief Narrative: Mark [name] is a 70 y.o. male with medical history significant for abdominal aortic aneurysm status post resection and grafting in 2015, atrial fibrillation on xarelto, CKD stage IV with anemia, dyslipidemia, hypertension, COPD with bronchiectasis and history of recurrent right pleural effusion. Patient reported several days of increased cough with thick clear sticky sputum. Last week he doubled up on his dosage of Lasix from 40 mg daily milligrams for 2 days and also total of 5 pounds. Unfortunately when he went back to his 40 mg dosage his weight went right back up. Continues to have issues with peripheral pitting edema. At baseline he utilizes 2 L nasal cannula oxygen only at night when sleeping but has had increasing dyspnea and is wearing 4 L continuously.  Assessment & Plan: Principal Problem:   Acute on chronic respiratory failure with hypoxia (HCC) Active Problems:   HTN (hypertension)   HLD (hyperlipidemia)   Acute on chronic diastolic CHF (congestive heart failure) (HCC)   COPD GOLD II with marked reversibility    CKD (chronic kidney disease), stage IV (HCC)   PAF (paroxysmal atrial fibrillation) (HCC)   Bronchiectasis with acute exacerbation (HCC)   COPD exacerbation (HCC)   Acute right heart failure   Metabolic acidosis   Anemia in CKD (chronic kidney disease)   Recurrent right pleural effusion  Acute on chronic respiratory failure with hypoxia: multifactorial respiratory failure related to acute COPD with bronchiectasis and suspected heart failure. - Treat underlying problems - Wean oxygen as able  Acute on chronic diastolic CHF (congestive heart failure)/ Acute right heart failure/Recurrent right pleural effusion: Requiring increased lasix doses due to progressive renal impairment. Suspect progressive right-sided heart failure with underlying  known severe pulmonary hypertension, moderate RV dilatation with mild RV systolic dysfunction and moderate TR on last echocardiogram August 2015 - s/p IV Lasix 60mg  x 3 doses, holding for worsening AKI on CKD. - No ACE inhibitor secondary to chronic kidney disease - Continue preadmission Toprol and Cardura - Consideration in the past given to thoracentesis but current effusions do not seem very large on plain films  COPD GOLD II with marked reversibility/Bronchiectasis with acute exacerbation/pleural effusions right greater than left  - Suspect acute bronchiectasis, subjectively improving - History of A. fib/RVR and currently in atrial flutter with variable conduction so we'll utilize Xopenex for bronchodilator - Solu-Medrol > transition to prednisone - Incentive spirometry - Continue preadmission guaifenesin  CKD (chronic kidney disease), stage IV: Cr bump 2.98 > 3.43 with diuresis.  - Lasix on hold for now pending HF team and nephrology evaluation - May require oral bicarbonate - Followed by Dr. Allena Katz in the outpatient setting; have consulted nephrology for further assistance.  HTN (hypertension): Moderate control and suspect volume overload contributing to elevated blood pressure readings - Anticipate blood pressure should improve with aggressive diuresis - Continue Cardura, Toprol and diltiazem  Paroxysmal AFib/Flutter: CHADVASc= 4, decreased xarelto dose for renal impairment. Admission ECG with atrial flutter with variable conduction but otherwise rate controlled - Continue beta blocker and CCB   HLD (hyperlipidemia) -Continue Lipitor  Anemia in CKD (chronic kidney disease) -Hemoglobin stable and at baseline of around 12  DVT prophylaxis: xarelto with GFR-based dosing Code Status: Full Family Communication: Wife and granddaughter at bedside Disposition Plan: Ongoing IV diuresis and discharge likely back to home.  Consultants:   Cardiology  Nephrology  Procedures:    None  Antimicrobials:  s/p azithromycin x1 10/31   Subjective: Pt feels subjective improvement in breathing. Denies wheezing, chest pain.   Objective: Vitals:   07/18/16 1959 07/19/16 0032 07/19/16 0430 07/19/16 0848  BP: (!) 141/72 (!) 145/75 (!) 145/78   Pulse: 64 61 (!) 58   Resp: 18 18 18    Temp: 98.3 F (36.8 C) 97.5 F (36.4 C) 97.5 F (36.4 C)   TempSrc: Oral Oral Oral   SpO2: 90% 92% 91% 93%  Weight:   66.8 kg (147 lb 4.8 oz)   Height:        Intake/Output Summary (Last 24 hours) at 07/19/16 0953 Last data filed at 07/19/16 0842  Gross per 24 hour  Intake             1200 ml  Output             1150 ml  Net               50 ml   Filed Weights   07/17/16 2106 07/18/16 0458 07/19/16 0430  Weight: 67.5 kg (148 lb 14.4 oz) 67.1 kg (148 lb) 66.8 kg (147 lb 4.8 oz)    Examination: General exam: 70 y.o. male in no distress Respiratory system: Non-labored breathing on 4 lpm. Decreased air movement without significant wheezes.  Cardiovascular system: Regular rate and rhythm. No murmur, rub, or gallop.+ JVD, and 2+ pedal edema. Gastrointestinal system: Abdomen soft, non-tender, non-distended, with normoactive bowel sounds. No organomegaly or masses felt. Central nervous system: Alert and oriented. No focal neurological deficits. Extremities: Warm, no deformities Skin: No rashes, lesions no ulcers Psychiatry: Judgement and insight appear normal. Mood & affect appropriate.   Data Reviewed: I have personally reviewed following labs and imaging studies  CBC:  Recent Labs Lab 07/17/16 1302  WBC 6.1  HGB 11.8*  HCT 37.2*  MCV 93.2  PLT 128*   Basic Metabolic Panel:  Recent Labs Lab 07/17/16 1302 07/17/16 2113 07/18/16 0349 07/19/16 0444  NA 138  --  136 136  K 4.7  --  5.1 5.2*  CL 106  --  104 105  CO2 23  --  24 23  GLUCOSE 176*  --  210* 148*  BUN 44*  --  51* 61*  CREATININE 2.98*  --  3.43* 3.58*  CALCIUM 9.0  --  9.1 9.2  MG  --  2.2  --    --    GFR: Estimated Creatinine Clearance: 17.4 mL/min (by C-G formula based on SCr of 3.58 mg/dL (H)). Liver Function Tests: No results for input(s): AST, ALT, ALKPHOS, BILITOT, PROT, ALBUMIN in the last 168 hours. No results for input(s): LIPASE, AMYLASE in the last 168 hours. No results for input(s): AMMONIA in the last 168 hours. Coagulation Profile: No results for input(s): INR, PROTIME in the last 168 hours. Cardiac Enzymes: No results for input(s): CKTOTAL, CKMB, CKMBINDEX, TROPONINI in the last 168 hours. BNP (last 3 results)  Recent Labs  05/08/16 1212  PROBNP 1,079.0*   HbA1C: No results for input(s): HGBA1C in the last 72 hours. CBG: No results for input(s): GLUCAP in the last 168 hours. Lipid Profile: No results for input(s): CHOL, HDL, LDLCALC, TRIG, CHOLHDL, LDLDIRECT in the last 72 hours. Thyroid Function Tests: No results for input(s): TSH, T4TOTAL, FREET4, T3FREE, THYROIDAB in the last 72 hours. Anemia Panel: No results for input(s): VITAMINB12, FOLATE, FERRITIN, TIBC, IRON, RETICCTPCT in the last 72 hours. Urine analysis:    Component Value Date/Time  COLORURINE YELLOW 10/07/2014 1932   APPEARANCEUR CLOUDY (A) 10/07/2014 1932   LABSPEC 1.010 10/07/2014 1932   PHURINE 6.0 10/07/2014 1932   GLUCOSEU NEGATIVE 10/07/2014 1932   HGBUR NEGATIVE 10/07/2014 1932   BILIRUBINUR NEGATIVE 10/07/2014 1932   KETONESUR NEGATIVE 10/07/2014 1932   PROTEINUR NEGATIVE 10/07/2014 1932   UROBILINOGEN 0.2 10/07/2014 1932   NITRITE NEGATIVE 10/07/2014 1932   LEUKOCYTESUR NEGATIVE 10/07/2014 1932   Sepsis Labs: @LABRCNTIP (procalcitonin:4,lacticidven:4)  )No results found for this or any previous visit (from the past 240 hour(s)).   Radiology Studies: Dg Chest 2 View  Result Date: 07/18/2016 CLINICAL DATA:  Follow-up right pleural effusion EXAM: CHEST  2 VIEW COMPARISON:  PA and lateral chest x-ray of July 17, 2016 FINDINGS: The lungs are mildly hyperinflated. There  small bilateral pleural effusions which are fairly stable. There is no pneumothorax. No pulmonary parenchymal masses or discrete infiltrates are observed. The heart is top-normal in size. The pulmonary vascularity is not engorged. The interstitial markings remain increased. There is calcification in the wall of the aortic arch. IMPRESSION: COPD. Stable small bilateral pleural effusions. Stable mild interstitial prominence diffusely which may reflect mild interstitial edema or acute on chronic bronchitic changes. Electronically Signed   By: David  [place]il[place]Jordan M.D.   On: 07/18/2016 08:00   Dg Chest 2 View  Result Date: 07/17/2016 CLINICAL DATA:  Shortness of breath. EXAM: CHEST  2 VIEW COMPARISON:  Radiographs of May 08, 2016. FINDINGS: Stable cardiomediastinal silhouette. Atherosclerosis of thoracic aorta is noted. Mild bibasilar edema or atelectasis is noted with mild bilateral pleural effusions. No pneumothorax is noted. Bony thorax is unremarkable. IMPRESSION: Mild bibasilar edema or atelectasis is noted with mild bilateral pleural effusions. Electronically Signed   By: Lupita RaiderJames  Green Jr, M.D.   On: 07/17/2016 15:23    Scheduled Meds: . amLODipine  5 mg Oral Daily  . atorvastatin  10 mg Oral QHS  . diltiazem  240 mg Oral Daily  . doxazosin  4 mg Oral Daily  . famotidine  20 mg Oral Daily  . levalbuterol  0.63 mg Nebulization Q8H  . methylPREDNISolone (SOLU-MEDROL) injection  60 mg Intravenous Q12H  . metoprolol succinate  100 mg Oral Daily  . mometasone-formoterol  2 puff Inhalation BID  . Rivaroxaban  15 mg Oral Q supper  . sodium chloride flush  3 mL Intravenous Q12H   Continuous Infusions:    LOS: 1 day   Time spent: 25 minutes.  Hazeline Junkeryan Grunz, MD [facility] Pager [phone]  If 7PM-7AM, please contact night-coverage [email or web address] Password [email or web address] 07/19/2016, 9:53 AM

## 2016-07-20 DIAGNOSIS — Z515 Encounter for palliative care: Secondary | ICD-10-CM

## 2016-07-20 DIAGNOSIS — Z66 Do not resuscitate: Secondary | ICD-10-CM

## 2016-07-20 DIAGNOSIS — Z7189 Other specified counseling: Secondary | ICD-10-CM

## 2016-07-20 DIAGNOSIS — N185 Chronic kidney disease, stage 5: Secondary | ICD-10-CM

## 2016-07-20 DIAGNOSIS — J90 Pleural effusion, not elsewhere classified: Secondary | ICD-10-CM

## 2016-07-20 DIAGNOSIS — D631 Anemia in chronic kidney disease: Secondary | ICD-10-CM

## 2016-07-20 LAB — BASIC METABOLIC PANEL
ANION GAP: 8 (ref 5–15)
ANION GAP: 11 (ref 5–15)
BUN: 75 mg/dL — ABNORMAL HIGH (ref 6–20)
BUN: 80 mg/dL — ABNORMAL HIGH (ref 6–20)
CO2: 22 mmol/L (ref 22–32)
CO2: 23 mmol/L (ref 22–32)
Calcium: 8.8 mg/dL — ABNORMAL LOW (ref 8.9–10.3)
Calcium: 9 mg/dL (ref 8.9–10.3)
Chloride: 103 mmol/L (ref 101–111)
Chloride: 102 mmol/L (ref 101–111)
Creatinine, Ser: 4.03 mg/dL — ABNORMAL HIGH (ref 0.61–1.24)
Creatinine, Ser: 4.14 mg/dL — ABNORMAL HIGH (ref 0.61–1.24)
GFR, EST AFRICAN AMERICAN: 16 mL/min — AB (ref >60)
GFR, EST AFRICAN AMERICAN: 15 mL/min — AB (ref >60)
GFR, EST NON AFRICAN AMERICAN: 14 mL/min — AB (ref >60)
GFR, EST NON AFRICAN AMERICAN: 13 mL/min — AB (ref >60)
GLUCOSE: 140 mg/dL — AB (ref 65–99)
GLUCOSE: 165 mg/dL — AB (ref 65–99)
POTASSIUM: 5.6 mmol/L — AB (ref 3.5–5.1)
POTASSIUM: 5.1 mmol/L (ref 3.5–5.1)
Sodium: 133 mmol/L — ABNORMAL LOW (ref 135–145)
Sodium: 136 mmol/L (ref 135–145)

## 2016-07-20 MED ORDER — LEVALBUTEROL HCL 0.63 MG/3ML IN NEBU
0.6300 mg | INHALATION_SOLUTION | Freq: Three times a day (TID) | RESPIRATORY_TRACT | Status: DC
  Administered 2016-07-21 – 2016-07-23 (×6): 0.63 mg via RESPIRATORY_TRACT
  Filled 2016-07-20 (×8): qty 3

## 2016-07-20 MED ORDER — SODIUM POLYSTYRENE SULFONATE 15 GM/60ML PO SUSP
45.0000 g | Freq: Once | ORAL | Status: AC
  Administered 2016-07-20: 45 g via ORAL
  Filled 2016-07-20: qty 180

## 2016-07-20 NOTE — Progress Notes (Signed)
Patient ID: Mark [name], male   DOB: 1946-01-22, 70 y.o.   MRN: [id number]  [facility] Progress Note   Assessment/ Plan:   1. Acute kidney injury and chronic kidney disease stage IV: Appears to be hemodynamically mediated in the setting of CHF exacerbation-unfortunately previous workup of his chronic kidney disease and consistent with chronic cardiorenal syndrome with diastolic heart failure and extensive vascular disease including history of AAA status post repair. Mild volume overload on physical exam-no clear indication of pulmonary edema or ascites. He does have bilateral pleural effusions but I do not feel that thoracentesis is of much value at this time. Worsening renal function noted on labs today with rise of potassium. Continue low-dose Lasix and follow for now. I would be very hesitant to start him on any chronic renal replacement therapy particularly with his tenuous cardiac status. Overall, risk of one-year mortality after starting dialysis is exceedingly high and unlikely to improve his quality of life much. Agree with involving hospice/palliative care at this point. 2. Hyperkalemia:  elevated potassium level noted from his labs today, will treat with Kayexalate 45 g and recheck labs again this afternoon. 3. Hypertension: Blood pressures appear to be marginally elevated, continue current management and re-dose diuretics 4. Acute CHF exacerbation: Ongoing evaluation by heart failure team and electrophysiology team-unfortunately, limited options for management given refractory atrial fibrillation/flutter and advancing chronic kidney disease.  Subjective:   Reports to be having intermittent shortness of breath and worsening by his abdominal distention. We discussed results of his abdominal ultrasound.    Objective:   BP 136/69 (BP Location: Left Arm)   Pulse 66   Temp 97.6 F (36.4 C) (Oral)   Resp 16   Ht 5' 6.5" (1.689 m)   Wt 67.8 kg (149 lb 7.6 oz)   SpO2 92%    BMI 23.76 kg/m   Intake/Output Summary (Last 24 hours) at 07/20/16 0856 Last data filed at 07/20/16 0600  Gross per 24 hour  Intake              360 ml  Output              325 ml  Net               35 ml   Weight change: 0.985 kg (2 lb 2.8 oz)  Physical Exam: WUJ:[id number]:Comfortable sitting on the edge of his bed, eating breakfast CVS: Pulse irregularly irregular Resp: Diminished breath sounds bilaterally-no rales or rhonchi Abd: Soft, obese, nontender Ext: Trace to 1+ lower extremity edema  Imaging: Ct Chest High Resolution  Result Date: 07/20/2016 CLINICAL DATA:  Shortness of breath, evaluate for pulmonary fibrosis. History of pulmonary hypertension. EXAM: CT CHEST WITHOUT CONTRAST TECHNIQUE: Multidetector CT imaging of the chest was performed following the standard protocol without intravenous contrast. High resolution imaging of the lungs, as well as inspiratory and expiratory imaging, was performed. COMPARISON:  None. FINDINGS: Cardiovascular: Atherosclerotic calcification of the arterial vasculature, including coronary arteries. Pulmonary arteries and heart are mildly enlarged. No pericardial effusion. Mediastinum/Nodes: Anterior scalene lymph nodes measure up to 10 mm on the right. Mediastinal lymph nodes measure up to 1.6 cm in the low right paratracheal station. Hilar regions are difficult to definitively evaluate without IV contrast. No axillary adenopathy. Small lymph nodes are seen along the course of the esophagus. Esophagus is grossly unremarkable. Lungs/Pleura: Moderate to severe centrilobular emphysema. Moderate right pleural effusion with associated posteromedial pleural thickening and collapse/ consolidation in the right lower lobe.  Small left pleural effusion with compressive atelectasis in the left lower lobe. Mild basilar septal thickening. Scattered subpleural nodular densities measure 4 mm or less in size and are likely subpleural lymph nodes. Probable mucoid impaction in the  lingula (series 3, image 75). Airway is unremarkable. Upper Abdomen: Visualized portions of the liver and adrenal glands are grossly unremarkable. Low-attenuation lesions in the kidneys measure up to approximately 4.5 cm on the right but are incompletely imaged. Visualized portions of the spleen, pancreas, stomach and bowel are grossly unremarkable. Musculoskeletal: No worrisome lytic or sclerotic lesions. IMPRESSION: 1. Congestive heart failure with right greater than left pleural effusions. Pleural thickening on the right suggests chronicity. Difficult to exclude malignancy. 2. Collapse/consolidation in the right lower lobe, possibly all compressive in etiology. Pneumonia cannot be excluded. 3. Aortic atherosclerosis (ICD10-170.0). Coronary artery calcification. 4.  Emphysema (ICD10-J43.9). 5. Enlarged pulmonary arteries, indicative of pulmonary arterial hypertension. 6. Mildly enlarged anterior scalene and mediastinal lymph nodes, likely reactive and related to congestive heart failure. Electronically Signed   By: Leanna BattlesMelinda  Blietz M.D.   On: 07/20/2016 08:47   [place] Abdomen Limited  Result Date: 07/19/2016 CLINICAL DATA:  Abdominal distention.  Evaluate for ascites EXAM: LIMITED ABDOMEN ULTRASOUND FOR ASCITES TECHNIQUE: Limited ultrasound survey for ascites was performed in all four abdominal quadrants. COMPARISON:  None. FINDINGS: There is no evidence of ascites. Bilateral pleural effusions are noted. IMPRESSION: No evidence of ascites. Bilateral pleural effusions. Electronically Signed   By: Jolaine ClickArthur  Hoss M.D.   On: 07/19/2016 15:49    Labs: BMET  Recent Labs Lab 07/17/16 1302 07/18/16 0349 07/19/16 0444 07/20/16 0234  NA 138 136 136 133*  K 4.7 5.1 5.2* 5.6*  CL 106 104 105 103  CO2 23 24 23 22   GLUCOSE 176* 210* 148* 140*  BUN 44* 51* 61* 75*  CREATININE 2.98* 3.43* 3.58* 4.03*  CALCIUM 9.0 9.1 9.2 8.8*   CBC  Recent Labs Lab 07/17/16 1302  WBC 6.1  HGB 11.8*  HCT 37.2*  MCV 93.2   PLT 128*   Medications:    . amLODipine  5 mg Oral Daily  . atorvastatin  10 mg Oral QHS  . diltiazem  240 mg Oral Daily  . doxazosin  4 mg Oral Daily  . famotidine  20 mg Oral Daily  . furosemide  40 mg Intravenous Daily  . levalbuterol  0.63 mg Nebulization Q8H  . metoprolol succinate  100 mg Oral Daily  . mometasone-formoterol  2 puff Inhalation BID  . predniSONE  20 mg Oral Q breakfast  . Rivaroxaban  15 mg Oral Q supper  . sodium chloride flush  3 mL Intravenous Q12H  . sodium polystyrene  45 g Oral Once   Zetta BillsJay Terena Bohan, MD 07/20/2016, 8:56 AM

## 2016-07-20 NOTE — Progress Notes (Signed)
Pt had 2.6 sec paused . Pt asymptomatic. Observe pt closely

## 2016-07-20 NOTE — Progress Notes (Addendum)
PROGRESS NOTE  Mark [name]  ZOX:[id number] DOB: Oct 03, 1945 DOA: 07/17/2016 PCP: Leo Grosser, MD   Brief Narrative: Mark [name] is a 70 y.o. male with medical history significant for abdominal aortic aneurysm status post resection and grafting in 2015, atrial fibrillation on xarelto, CKD stage IV with anemia, dyslipidemia, hypertension, COPD with bronchiectasis and history of recurrent right pleural effusion. Patient reported several days of increased cough with thick clear sticky sputum. Last week he doubled up on his dosage of Lasix from 40 mg daily milligrams for 2 days and also total of 5 pounds. Unfortunately when he went back to his 40 mg dosage his weight went right back up. Continues to have issues with peripheral pitting edema. At baseline he utilizes 2 L nasal cannula oxygen only at night when sleeping but has had increasing dyspnea and is wearing 4 L continuously.  Assessment & Plan: Principal Problem:   Acute on chronic respiratory failure with hypoxia (HCC) Active Problems:   HTN (hypertension)   HLD (hyperlipidemia)   Acute on chronic diastolic CHF (congestive heart failure) (HCC)   COPD GOLD II with marked reversibility    CKD (chronic kidney disease), stage IV (HCC)   PAF (paroxysmal atrial fibrillation) (HCC)   Bronchiectasis with acute exacerbation (HCC)   COPD exacerbation (HCC)   Acute right heart failure   Metabolic acidosis   Anemia in CKD (chronic kidney disease)   Recurrent right pleural effusion   PAH (pulmonary artery hypertension)  Acute on chronic respiratory failure with hypoxia: multifactorial respiratory failure related to acute COPD with bronchiectasis and suspected heart failure. - HRCT showed R>L pleural effusion and chronic pleural thickening (malignancy not excluded), and collapse/consolidation in RLL, compressive vs. infiltrate, and background of emphysema - Maximal medical therapies not very effective. I will contact palliative care  today  End-stage right ventricular failure: Acute on chronic diastolic CHF: refractory to increased lasix doses. Echo with right-sided volume and pressure overload. Suspect cor pulmonale, progressive right-sided heart failure with underlying known severe pulmonary hypertension (PA peak ). LVEF 50-55% with no wall motion abnormalities. - deescalated diuresis for worsening AKI on CKD. - No ACE inhibitor secondary to chronic kidney disease - Continue preadmission Toprol and Cardura - No ascites on ultrasound 11/2  COPD GOLD II with marked reversibility/Bronchiectasis with acute exacerbation/pleural effusions right greater than left  - History of A. fib/RVR and currently in atrial flutter with variable conduction so we'll utilize Xopenex for bronchodilator - Solu-Medrol > transition to prednisone - Incentive spirometry - Continue preadmission guaifenesin - Consideration in the past given to thoracentesis but current effusions not amenable  CKD (chronic kidney disease), stage IV: Cr bumping with diuresis. Likely cardiorenal syndrome. - Per nephrology, not candidate for HD. - Hyperkalemia developing: Give kayexalate, recheck in PM  HTN (hypertension): Moderate control and suspect volume overload contributing to elevated blood pressure readings - Continue Cardura, Toprol and diltiazem, added norvasc with improvement  Paroxysmal AFib/Flutter: CHADVASc= 4, decreased xarelto dose for renal impairment. Admission ECG with atrial flutter with variable conduction but otherwise rate controlled. Etiology is likely RA enlargement due to cor pulmonale. - Continue beta blocker and CCB - With impaired renal function not a candidate for tikosyn, sotalol. Amiodarone limited by pulm toxicity.    HLD (hyperlipidemia) -Continue Lipitor  Anemia in CKD (chronic kidney disease) -Hemoglobin stable and at baseline of around 12  DVT prophylaxis: xarelto with GFR-based dosing: may consider return to  coumadin with worsening renal function Code Status: Full Family Communication: None  at bedside this AM Disposition Plan: Ongoing IV diuresis and discharge likely back to home pending improvement.  Consultants:   Cardiology  Nephrology  Palliative care medicine  Procedures:  None  Antimicrobials:  s/p azithromycin x1 10/31   Subjective: Now says breathing is not improving. Denies wheezing, chest pain. Exasperated over failure to improve.  Objective: Vitals:   07/19/16 1953 07/20/16 0429 07/20/16 0733 07/20/16 0954  BP: 137/82 136/69  (!) 153/70  Pulse: 63 66  (!) 111  Resp: 18 16  19   Temp: 97.5 F (36.4 C) 97.6 F (36.4 C)  97.5 F (36.4 C)  TempSrc: Oral Oral    SpO2: 93% 92% 92% 96%  Weight:  67.8 kg (149 lb 7.6 oz)    Height:        Intake/Output Summary (Last 24 hours) at 07/20/16 1135 Last data filed at 07/20/16 1005  Gross per 24 hour  Intake              600 ml  Output              475 ml  Net              125 ml   Filed Weights   07/18/16 0458 07/19/16 0430 07/20/16 0429  Weight: 67.1 kg (148 lb) 66.8 kg (147 lb 4.8 oz) 67.8 kg (149 lb 7.6 oz)    Examination: General exam: 70 y.o. male in no distress sitting on side of bed. Respiratory system: Non-labored breathing on 4 lpm. Decreased air movement without significant wheezes.  Cardiovascular system: Regular rate and rhythm. No murmur, rub, or gallop.+ JVD, and 2+ pedal edema. Gastrointestinal system: Abdomen soft, non-tender, non-distended, with normoactive bowel sounds. No organomegaly or masses felt. Central nervous system: Alert and oriented. No focal neurological deficits. Extremities: Warm, no deformities Skin: No rashes, lesions no ulcers Psychiatry: Judgement and insight appear normal. Mood & affect appropriate.   Data Reviewed: I have personally reviewed following labs and imaging studies  CBC:  Recent Labs Lab 07/17/16 1302  WBC 6.1  HGB 11.8*  HCT 37.2*  MCV 93.2  PLT 128*    Basic Metabolic Panel:  Recent Labs Lab 07/17/16 1302 07/17/16 2113 07/18/16 0349 07/19/16 0444 07/20/16 0234  NA 138  --  136 136 133*  K 4.7  --  5.1 5.2* 5.6*  CL 106  --  104 105 103  CO2 23  --  24 23 22   GLUCOSE 176*  --  210* 148* 140*  BUN 44*  --  51* 61* 75*  CREATININE 2.98*  --  3.43* 3.58* 4.03*  CALCIUM 9.0  --  9.1 9.2 8.8*  MG  --  2.2  --   --   --    GFR: Estimated Creatinine Clearance: 15.5 mL/min (by C-G formula based on SCr of 4.03 mg/dL (H)). Liver Function Tests: No results for input(s): AST, ALT, ALKPHOS, BILITOT, PROT, ALBUMIN in the last 168 hours. No results for input(s): LIPASE, AMYLASE in the last 168 hours. No results for input(s): AMMONIA in the last 168 hours. Coagulation Profile: No results for input(s): INR, PROTIME in the last 168 hours. Cardiac Enzymes: No results for input(s): CKTOTAL, CKMB, CKMBINDEX, TROPONINI in the last 168 hours. BNP (last 3 results)  Recent Labs  05/08/16 1212  PROBNP 1,079.0*   HbA1C: No results for input(s): HGBA1C in the last 72 hours. CBG: No results for input(s): GLUCAP in the last 168 hours. Lipid Profile: No results for input(s):  CHOL, HDL, LDLCALC, TRIG, CHOLHDL, LDLDIRECT in the last 72 hours. Thyroid Function Tests: No results for input(s): TSH, T4TOTAL, FREET4, T3FREE, THYROIDAB in the last 72 hours. Anemia Panel: No results for input(s): VITAMINB12, FOLATE, FERRITIN, TIBC, IRON, RETICCTPCT in the last 72 hours. Urine analysis:    Component Value Date/Time   COLORURINE YELLOW 10/07/2014 1932   APPEARANCEUR CLOUDY (A) 10/07/2014 1932   LABSPEC 1.010 10/07/2014 1932   PHURINE 6.0 10/07/2014 1932   GLUCOSEU NEGATIVE 10/07/2014 1932   HGBUR NEGATIVE 10/07/2014 1932   BILIRUBINUR NEGATIVE 10/07/2014 1932   KETONESUR NEGATIVE 10/07/2014 1932   PROTEINUR NEGATIVE 10/07/2014 1932   UROBILINOGEN 0.2 10/07/2014 1932   NITRITE NEGATIVE 10/07/2014 1932   LEUKOCYTESUR NEGATIVE 10/07/2014 1932    Sepsis Labs: @LABRCNTIP (procalcitonin:4,lacticidven:4)  )No results found for this or any previous visit (from the past 240 hour(s)).   Radiology Studies: Ct Chest High Resolution  Result Date: 07/20/2016 CLINICAL DATA:  Shortness of breath, evaluate for pulmonary fibrosis. History of pulmonary hypertension. EXAM: CT CHEST WITHOUT CONTRAST TECHNIQUE: Multidetector CT imaging of the chest was performed following the standard protocol without intravenous contrast. High resolution imaging of the lungs, as well as inspiratory and expiratory imaging, was performed. COMPARISON:  None. FINDINGS: Cardiovascular: Atherosclerotic calcification of the arterial vasculature, including coronary arteries. Pulmonary arteries and heart are mildly enlarged. No pericardial effusion. Mediastinum/Nodes: Anterior scalene lymph nodes measure up to 10 mm on the right. Mediastinal lymph nodes measure up to 1.6 cm in the low right paratracheal station. Hilar regions are difficult to definitively evaluate without IV contrast. No axillary adenopathy. Small lymph nodes are seen along the course of the esophagus. Esophagus is grossly unremarkable. Lungs/Pleura: Moderate to severe centrilobular emphysema. Moderate right pleural effusion with associated posteromedial pleural thickening and collapse/ consolidation in the right lower lobe. Small left pleural effusion with compressive atelectasis in the left lower lobe. Mild basilar septal thickening. Scattered subpleural nodular densities measure 4 mm or less in size and are likely subpleural lymph nodes. Probable mucoid impaction in the lingula (series 3, image 75). Airway is unremarkable. Upper Abdomen: Visualized portions of the liver and adrenal glands are grossly unremarkable. Low-attenuation lesions in the kidneys measure up to approximately 4.5 cm on the right but are incompletely imaged. Visualized portions of the spleen, pancreas, stomach and bowel are grossly unremarkable.  Musculoskeletal: No worrisome lytic or sclerotic lesions. IMPRESSION: 1. Congestive heart failure with right greater than left pleural effusions. Pleural thickening on the right suggests chronicity. Difficult to exclude malignancy. 2. Collapse/consolidation in the right lower lobe, possibly all compressive in etiology. Pneumonia cannot be excluded. 3. Aortic atherosclerosis (ICD10-170.0). Coronary artery calcification. 4.  Emphysema (ICD10-J43.9). 5. Enlarged pulmonary arteries, indicative of pulmonary arterial hypertension. 6. Mildly enlarged anterior scalene and mediastinal lymph nodes, likely reactive and related to congestive heart failure. Electronically Signed   By: Leanna Battles M.D.   On: 07/20/2016 08:47   US Abdomen Limited  Result Date: 07/19/2016 CLINICAL DATA:  Abdominal distention.  Evaluate for ascites EXAM: LIMITED ABDOMEN ULTRASOUND FOR ASCITES TECHNIQUE: Limited ultrasound survey for ascites was performed in all four abdominal quadrants. COMPARISON:  None. FINDINGS: There is no evidence of ascites. Bilateral pleural effusions are noted. IMPRESSION: No evidence of ascites. Bilateral pleural effusions. Electronically Signed   By: Jolaine Click M.D.   On: 07/19/2016 15:49    Scheduled Meds: . amLODipine  5 mg Oral Daily  . atorvastatin  10 mg Oral QHS  . diltiazem  240 mg Oral Daily  .  doxazosin  4 mg Oral Daily  . famotidine  20 mg Oral Daily  . furosemide  40 mg Intravenous Daily  . levalbuterol  0.63 mg Nebulization Q8H  . metoprolol succinate  100 mg Oral Daily  . mometasone-formoterol  2 puff Inhalation BID  . predniSONE  20 mg Oral Q breakfast  . Rivaroxaban  15 mg Oral Q supper  . sodium chloride flush  3 mL Intravenous Q12H   Continuous Infusions:    LOS: 2 days   Time spent: 25 minutes.  Hazeline Junkeryan Tadd Holtmeyer, MD [facility] Pager [phone]  If 7PM-7AM, please contact night-coverage [email or web address] Password [email or web address] 07/20/2016, 11:35 AM

## 2016-07-20 NOTE — Consult Note (Signed)
Consultation Note Date: 07/20/2016   Patient Name: Mark [name]  DOB: July 22, 1946  MRN: [id number]  Age / Sex: 70 y.o., male  PCP: Susy Frizzle, MD Referring Physician: Patrecia Pour, MD  Reason for Consultation: Establishing goals of care  HPI/Patient Profile: 70 y.o. male with past medical history of CKD stage 4, pulmonary hypertension, heart failure, DM, COPD with bronchiectasis and history of recurrent pleural effusion who was admitted on 07/17/2016 with acute on chronic respiratory failure with hypoxia.  He was in volume overload with acute on chronic diastolic CHF.  Further,  labs indicated metabolic acidosis.  He was admitted by the hospitalists.  Nephrology and Cardiology (Heart Failure) and EP were consulted.   At this point treatments for his heart, lung and kidney failure are very limited.  His response to diuresis has been slow and his creatinine is trending up.  He is not a candidate for hemodialysis.  Cardiology plans to cardiovert him again tomorrow (I believe this will be the 4th time).    Clinical Assessment and Goals of Care: I met Mr. Bain at bedside with Dr. Martyn Malay.  Mr. Keast has a very positive energy.  He lives at home with his wife.  She has severe arthritis and they depend heavily on each other.  The two of them managed to do their own cooking, grocery shopping etc.  Mr. Gamel is still driving.  He gets short of breath quickly when walking even with oxygen.  We discussed advanced directives and code status.  Mr. Bogacz does not want to be on "life support" if he is near the end of life.  His wife and daughter would make his health care decisions if he was unable to do so.  With regard to code status he clearly does not want to be intubated or have CPR.  After further conversation about how CPR and defibrillation are inter-related he decided he would not want resuscitation if his  heart stopped or he stopped breathing.  Mr. Denomme did not feel that his time was short and was quite surprised today when he learned his condition from the physicians.  He does not want hospice services at this point, even though he may be well qualified for them.  He is agreeable to palliative care at home.  Afterward I spoke with Mrs. Mcbee and their [place] daughter Caryl Pina on the phone.  We discussed the change in code status and the advanced directive choices.  Mrs. Haidar and Caryl Pina brought up questions about the cardioversion planned for tomorrow.  Their questions centered around whether or not thoracentesis should be done 1st (before cardioversion) and would cardioversion cause further damage to his heart.  I deferred these questions to the Heart Failure Team.  Primary Decision Maker:  PATIENT    SUMMARY OF RECOMMENDATIONS    I passed a message to the HF team to please follow up with the wife and grand daughter Caryl Pina before Cardioversion.  Code Status/Advance Care Planning:  DNR    Symptom Management:  Per primary team.   Additional Recommendations (Limitations, Scope, Preferences):  Hospice would be most beneficial but patient only accepting of Palliative Care at home at this point.  Psycho-social/Spiritual:   Desire for further Chaplaincy support:no - Patient is a Psychologist, forensic.  Additional Recommendations: Education on Hospice  Prognosis:   < 6 months Given advanced heart, kidney and lung disease with dyspnea even at rest and few therapies to relieve the inevitable recurrent fluid build up I would anticipate his time would be less than a few months.  However he appears to be eating well, still fairly functional, and has a strong will to live which may extend his prognosis.  Discharge Planning: Home with Palliative Services and home health.      Primary Diagnoses: Present on Admission: . (Resolved) Acute diastolic heart failure ([place]) . Acute right heart failure .  Acute on chronic respiratory failure with hypoxia ([place]) . Acute on chronic diastolic CHF (congestive heart failure) ([place]) . Bronchiectasis with acute exacerbation ([place]) . CKD (chronic kidney disease), stage IV ([place]) . COPD GOLD II with marked reversibility  . HLD (hyperlipidemia) . HTN (hypertension) . PAF (paroxysmal atrial fibrillation) ([place]) . Metabolic acidosis . Anemia in CKD (chronic kidney disease) . Pleural effusion on right . COPD exacerbation ([place])   I have reviewed the medical record, interviewed the patient and family, and examined the patient. The following aspects are pertinent.  Past Medical History:  Diagnosis Date  . AAA (abdominal aortic aneurysm) ([place])    a. 09/2013: Resection and grafting of abdominal aortic aneurysm with insertion of an aorto by common iliac graft using 18 x 9 mm Hemashield Dacron graft.  . Arthritis    "little in my hands" (09/08/2013)  . Chronic diastolic CHF (congestive heart failure) ([place])    a.  Echo (08/13/2013): EF 55-60%, normal wall motion, mild MR, mild LAE  . Chronic kidney disease (CKD), stage III (moderate)   . COPD (chronic obstructive pulmonary disease) ([place])   . Diabetes mellitus without complication ([place])    TYPE 2  . GERD (gastroesophageal reflux disease)   . H/O hiatal hernia   . Hx of cardiovascular stress test    a. Myoview 09/2012:  no scar or ischemia  . Hyperlipidemia   . Hypertension   . On home oxygen therapy    "2L @ night" (07/17/2016)  . PAF (paroxysmal atrial fibrillation) (HCC)    a. amiodarone Rx started 08/2013;  b. s/p TEE-DCCV 09/2013 => recurrent AFib => repeat DCCV => NSR;   c. Xarelto 15 QD due to CKD  . Pulmonary hypertension associated with unclear multi-factorial mechanisms    Only on echo (8/17).     Social History   Social History  . Marital status: Married    Spouse name: Earlie Server  . Number of children: 4  . Years of education: N/A   Occupational History  . Works PT as a Curator    Social  History Main Topics  . Smoking status: Former Smoker    Packs/day: 1.00    Years: 56.00    Types: Cigarettes    Quit date: 07/31/2013  . Smokeless tobacco: Former Systems developer    Types: Chew  . Alcohol use No  . Drug use: No  . Sexual activity: Not Currently   Other Topics Concern  . None   Social History Narrative   Married.  Lives with wife.  Ambulates without assistance.   Family History  Problem Relation Age of Onset  . Diabetes Mother   .  Tuberculosis Mother   . Heart disease Father   . Heart attack Father   . Heart disease Son     Blood clot:  Arm   Scheduled Meds: . amLODipine  5 mg Oral Daily  . atorvastatin  10 mg Oral QHS  . diltiazem  240 mg Oral Daily  . doxazosin  4 mg Oral Daily  . famotidine  20 mg Oral Daily  . furosemide  40 mg Intravenous Daily  . levalbuterol  0.63 mg Nebulization Q8H  . metoprolol succinate  100 mg Oral Daily  . mometasone-formoterol  2 puff Inhalation BID  . predniSONE  20 mg Oral Q breakfast  . Rivaroxaban  15 mg Oral Q supper  . sodium chloride flush  3 mL Intravenous Q12H   Continuous Infusions:  PRN Meds:.sodium chloride, acetaminophen, dextromethorphan-guaiFENesin, hydrOXYzine, ondansetron (ZOFRAN) IV, sodium chloride flush Allergies  Allergen Reactions  . Niaspan [Niacin Er] Anaphylaxis  . Amiodarone     Stopped due to lung issues   Review of Systems  Constitutional: Positive for activity change and fatigue.  HENT: Negative.   Eyes: Negative.   Respiratory: Positive for shortness of breath.   Cardiovascular: Positive for leg swelling.  Gastrointestinal: Positive for constipation.  Endocrine: Positive for cold intolerance.  Musculoskeletal: Negative.   Neurological: Negative.   Psychiatric/Behavioral: Negative.    -   Physical Exam  Constitutional: He appears well-developed.  Thin, appears older than stated age. +body tremor.  HENT:  Head: Normocephalic and atraumatic.  Mouth/Throat: Oropharynx is clear and moist.  No oropharyngeal exudate.  Eyes: EOM are normal. No scleral icterus.  Neck: Normal range of motion. Neck supple.  Cardiovascular:  Irregularly irregular, no m/r/g  Pulmonary/Chest:  Moderately increased work of breathing at rest.  No w/c/r  Abdominal: Soft. Bowel sounds are normal.  Musculoskeletal: He exhibits edema.  Skin: Skin is warm and dry.  Psychiatric: His behavior is normal.  Mood surprisingly positive.    Vital Signs: BP (!) 166/85 (BP Location: Left Arm)   Pulse 70   Temp 97.8 F (36.6 C) (Oral)   Resp 16   Ht 5' 6.5" (1.689 m)   Wt 67.8 kg (149 lb 7.6 oz)   SpO2 94%   BMI 23.76 kg/m  Pain Assessment: No/denies pain   Pain Score: 0-No pain   SpO2: SpO2: 94 % O2 Device:SpO2: 94 % O2 Flow Rate: .O2 Flow Rate (L/min): 2 L/min  IO: Intake/output summary:   Intake/Output Summary (Last 24 hours) at 07/20/16 1708 Last data filed at 07/20/16 1328  Gross per 24 hour  Intake              470 ml  Output              450 ml  Net               20 ml    LBM: Last BM Date: 07/19/16 Baseline Weight: Weight: 67.6 kg (149 lb) Most recent weight: Weight: 67.8 kg (149 lb 7.6 oz)     Palliative Assessment/Data:   Flowsheet Rows   Flowsheet Row Most Recent Value  Intake Tab  Referral Department  Hospitalist  Unit at Time of Referral  Cardiac/Telemetry Unit  Palliative Care Primary Diagnosis  Other (Comment)  Date Notified  07/20/16  Palliative Care Type  New Palliative care  Reason for referral  Clarify Goals of Care  Date of Admission  07/17/16  Date first seen by Palliative Care  07/20/16  # of  days Palliative referral response time  0 Day(s)  # of days IP prior to Palliative referral  3  Clinical Assessment  Palliative Performance Scale Score  50%  Dyspnea Max Last 24 Hours  6  Dyspnea Min Last 24 hours  4  Psychosocial & Spiritual Assessment  Palliative Care Outcomes  Patient/Family meeting held?  Yes  Who was at the meeting?  patient.  Then spoke on the  phone with wife and grand daughter.  Palliative Care Outcomes  Clarified goals of care  Patient/Family wishes: Interventions discontinued/not started   Mechanical Ventilation      Time In: 3:30 Time Out:  5:00 Time Total: 90 min Greater than 50%  of this time was spent counseling and coordinating care related to the above assessment and plan.  Signed by: Imogene Burn, PA-C Palliative Medicine Pager: [phone]  Please contact Palliative Medicine Team phone at [phone] for questions and concerns.  For individual provider: See Shea Evans

## 2016-07-20 NOTE — Evaluation (Signed)
Physical Therapy Evaluation Patient Details Name: Mark [name] MRN: [id number] DOB: 10-04-1945 Today's Date: 07/20/2016   History of Present Illness  Pt is a 70 y/o male admitted secondary to increased SOB and productive cough. PMH including but not limited to COPD, CHF, HTN, CKD, and PAF.  Clinical Impression  Pt presented supine in bed with HOB elevated, awake and willing to participate in therapy session. Prior to admission, pt reported that he was independent with all functional mobility. Pt moving very well during evaluation with min guard for safety only, no need for physical assistance. PT answered all of pt's questions at the end of the session. No acute PT needs identified at this time. PT sign-off.    Follow Up Recommendations No PT follow up    Equipment Recommendations  None recommended by PT    Recommendations for Other Services       Precautions / Restrictions Precautions Precautions: None Restrictions Weight Bearing Restrictions: No      Mobility  Bed Mobility Overal bed mobility: Modified Independent                Transfers Overall transfer level: Needs assistance Equipment used: None Transfers: Sit to/from Stand Sit to Stand: Min guard         General transfer comment: min guard for safety  Ambulation/Gait Ambulation/Gait assistance: Min guard Ambulation Distance (Feet): 25 Feet Assistive device: None Gait Pattern/deviations: Step-through pattern;Decreased stride length Gait velocity: decreased Gait velocity interpretation: Below normal speed for age/gender General Gait Details: pt stable with ambulation, no LOB  Stairs            Wheelchair Mobility    Modified Rankin (Stroke Patients Only)       Balance Overall balance assessment: Needs assistance Sitting-balance support: Feet supported;No upper extremity supported Sitting balance-Leahy Scale: Good     Standing balance support: During functional activity;No upper  extremity supported Standing balance-Leahy Scale: Fair                               Pertinent Vitals/Pain Pain Assessment: No/denies pain    Home Living Family/patient expects to be discharged to:: Private residence Living Arrangements: Spouse/significant other;Other relatives Available Help at Discharge: Family;Available PRN/intermittently Type of Home: House         Home Equipment: Walker - 4 wheels (pt does not use RW, he stated his wife uses it as a table)      Prior Function Level of Independence: Independent               Hand Dominance        Extremity/Trunk Assessment   Upper Extremity Assessment: Overall WFL for tasks assessed           Lower Extremity Assessment: Overall WFL for tasks assessed      Cervical / Trunk Assessment: Normal  Communication   Communication: HOH  Cognition Arousal/Alertness: Awake/alert Behavior During Therapy: WFL for tasks assessed/performed Overall Cognitive Status: Within Functional Limits for tasks assessed                      General Comments General comments (skin integrity, edema, etc.): MMT revealed 5/5 for hip flexion, hip abduction, hip adduction, knee flexion, knee extension, and ankle DF bilaterally.    Exercises     Assessment/Plan    PT Assessment Patent does not need any further PT services  PT Problem List  PT Treatment Interventions      PT Goals (Current goals can be found in the Care Plan section)  Acute Rehab PT Goals Patient Stated Goal: return home    Frequency     Barriers to discharge        Co-evaluation               End of Session Equipment Utilized During Treatment: Gait belt;Oxygen (2L O2 via [facility]) Activity Tolerance: Patient tolerated treatment well Patient left: in bed;with call bell/phone within reach (sitting EOB) Nurse Communication: Mobility status         Time: [id number] PT Time Calculation (min) (ACUTE ONLY): 21  min   Charges:   PT Evaluation $PT Eval Moderate Complexity: 1 Procedure     PT G CodesAlessandra Bevels:        Jaishaun Mcnab M Areeba Sulser 07/20/2016, 5:05 PM Deborah ChalkJennifer Laurier Jasperson, PT, DPT [phone]

## 2016-07-20 NOTE — Progress Notes (Addendum)
Advanced Heart Failure Rounding Note   Subjective:   Yesterday diuresed with 40 mg IV lasix.  Creatinine trending up 3.58>4.03. Weight up 2 pounds.   Continues to be SOB and fatigued. Frustrated.    CT High Resolution 1. Congestive heart failure with right greater than left pleural effusions. Pleural thickening on the right suggests chronicity. Difficult to exclude malignancy. 2. Collapse/consolidation in the right lower lobe, possibly all compressive in etiology. Pneumonia cannot be excluded. 3. Aortic atherosclerosis (ICD10-170.0). Coronary artery calcification. 4.  Emphysema (ICD10-J43.9). 5. Enlarged pulmonary arteries, indicative of pulmonary arterial hypertension. 6. Mildly enlarged anterior scalene and mediastinal lymph nodes, likely reactive and related to congestive heart failure.   Objective:   Weight Range:  Vital Signs:   Temp:  [97.5 F (36.4 C)-97.6 F (36.4 C)] 97.5 F (36.4 C) (11/03 0954) Pulse Rate:  [62-111] 111 (11/03 0954) Resp:  [16-19] 19 (11/03 0954) BP: (125-153)/(69-82) 153/70 (11/03 0954) SpO2:  [90 %-96 %] 96 % (11/03 0954) Weight:  [149 lb 7.6 oz (67.8 kg)] 149 lb 7.6 oz (67.8 kg) (11/03 0429) Last BM Date: 07/19/16  Weight change: Filed Weights   07/18/16 0458 07/19/16 0430 07/20/16 0429  Weight: 148 lb (67.1 kg) 147 lb 4.8 oz (66.8 kg) 149 lb 7.6 oz (67.8 kg)    Intake/Output:   Intake/Output Summary (Last 24 hours) at 07/20/16 1118 Last data filed at 07/20/16 1005  Gross per 24 hour  Intake              600 ml  Output              475 ml  Net              125 ml    Physical Exam: General:  Chronically ill apeparing  No resp difficulty. Sitting on the side of the bed HEENT: normal Neck: supple. JVP jaw  . Carotids 2+ bilat; no bruits. No lymphadenopathy or thryomegaly appreciated. Cor: PMI nondisplaced. Irregular rate & rhythm. 2/6 TR RV heve Lungs: very poor air movement  on 2 liters oxygen Abdomen: soft, nontender,  distended. No hepatosplenomegaly. No bruits or masses. Good bowel sounds. Extremities: no cyanosis, clubbing, rash, R and LLE 2+ edema . R and LLE ted hose.  Neuro: alert & orientedx3, cranial nerves grossly intact. moves all 4 extremities w/o difficulty. Affect pleasant  Telemetry:  A flutter 70s   Labs: Basic Metabolic Panel:  Recent Labs Lab 07/17/16 1302 07/17/16 2113 07/18/16 0349 07/19/16 0444 07/20/16 0234  NA 138  --  136 136 133*  K 4.7  --  5.1 5.2* 5.6*  CL 106  --  104 105 103  CO2 23  --  24 23 22   GLUCOSE 176*  --  210* 148* 140*  BUN 44*  --  51* 61* 75*  CREATININE 2.98*  --  3.43* 3.58* 4.03*  CALCIUM 9.0  --  9.1 9.2 8.8*  MG  --  2.2  --   --   --     Liver Function Tests: No results for input(s): AST, ALT, ALKPHOS, BILITOT, PROT, ALBUMIN in the last 168 hours. No results for input(s): LIPASE, AMYLASE in the last 168 hours. No results for input(s): AMMONIA in the last 168 hours.  CBC:  Recent Labs Lab 07/17/16 1302  WBC 6.1  HGB 11.8*  HCT 37.2*  MCV 93.2  PLT 128*    Cardiac Enzymes: No results for input(s): CKTOTAL, CKMB, CKMBINDEX, TROPONINI in the last  168 hours.  BNP: BNP (last 3 results)  Recent Labs  07/17/16 1302  BNP 915.0*    ProBNP (last 3 results)  Recent Labs  05/08/16 1212  PROBNP 1,079.0*      Other results:  Imaging: Ct Chest High Resolution  Result Date: 07/20/2016 CLINICAL DATA:  Shortness of breath, evaluate for pulmonary fibrosis. History of pulmonary hypertension. EXAM: CT CHEST WITHOUT CONTRAST TECHNIQUE: Multidetector CT imaging of the chest was performed following the standard protocol without intravenous contrast. High resolution imaging of the lungs, as well as inspiratory and expiratory imaging, was performed. COMPARISON:  None. FINDINGS: Cardiovascular: Atherosclerotic calcification of the arterial vasculature, including coronary arteries. Pulmonary arteries and heart are mildly enlarged. No  pericardial effusion. Mediastinum/Nodes: Anterior scalene lymph nodes measure up to 10 mm on the right. Mediastinal lymph nodes measure up to 1.6 cm in the low right paratracheal station. Hilar regions are difficult to definitively evaluate without IV contrast. No axillary adenopathy. Small lymph nodes are seen along the course of the esophagus. Esophagus is grossly unremarkable. Lungs/Pleura: Moderate to severe centrilobular emphysema. Moderate right pleural effusion with associated posteromedial pleural thickening and collapse/ consolidation in the right lower lobe. Small left pleural effusion with compressive atelectasis in the left lower lobe. Mild basilar septal thickening. Scattered subpleural nodular densities measure 4 mm or less in size and are likely subpleural lymph nodes. Probable mucoid impaction in the lingula (series 3, image 75). Airway is unremarkable. Upper Abdomen: Visualized portions of the liver and adrenal glands are grossly unremarkable. Low-attenuation lesions in the kidneys measure up to approximately 4.5 cm on the right but are incompletely imaged. Visualized portions of the spleen, pancreas, stomach and bowel are grossly unremarkable. Musculoskeletal: No worrisome lytic or sclerotic lesions. IMPRESSION: 1. Congestive heart failure with right greater than left pleural effusions. Pleural thickening on the right suggests chronicity. Difficult to exclude malignancy. 2. Collapse/consolidation in the right lower lobe, possibly all compressive in etiology. Pneumonia cannot be excluded. 3. Aortic atherosclerosis (ICD10-170.0). Coronary artery calcification. 4.  Emphysema (ICD10-J43.9). 5. Enlarged pulmonary arteries, indicative of pulmonary arterial hypertension. 6. Mildly enlarged anterior scalene and mediastinal lymph nodes, likely reactive and related to congestive heart failure. Electronically Signed   By: Leanna Battles M.D.   On: 07/20/2016 08:47   US Abdomen Limited  Result Date:  07/19/2016 CLINICAL DATA:  Abdominal distention.  Evaluate for ascites EXAM: LIMITED ABDOMEN ULTRASOUND FOR ASCITES TECHNIQUE: Limited ultrasound survey for ascites was performed in all four abdominal quadrants. COMPARISON:  None. FINDINGS: There is no evidence of ascites. Bilateral pleural effusions are noted. IMPRESSION: No evidence of ascites. Bilateral pleural effusions. Electronically Signed   By: Jolaine Click M.D.   On: 07/19/2016 15:49      Medications:     Scheduled Medications: . amLODipine  5 mg Oral Daily  . atorvastatin  10 mg Oral QHS  . diltiazem  240 mg Oral Daily  . doxazosin  4 mg Oral Daily  . famotidine  20 mg Oral Daily  . furosemide  40 mg Intravenous Daily  . levalbuterol  0.63 mg Nebulization Q8H  . metoprolol succinate  100 mg Oral Daily  . mometasone-formoterol  2 puff Inhalation BID  . predniSONE  20 mg Oral Q breakfast  . Rivaroxaban  15 mg Oral Q supper  . 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 Acute/Chronic Diastolic Heart Failure- ECHO EF 60% Peak PA pressure 59 mg Hg 3. A flutter 4. PAF- S/P DC-CV 2015 and 2017  5. AKI/CKD- Stage IV 6. Hyperkalemia  Plan/Discussion:    Sluggish response from IV lasix. Creatinine trending up. Nephrology giving IV lasix and kayexalate. No plans for HD.   May need Palliative Care consult for goals of care. .  Length of Stay: 2   Amy Clegg NP-C  07/20/2016, 11:18 AM  Advanced Heart Failure Team Pager [phone] (M-F; 7a - 4p)  Please contact [facility] for night-coverage after hours (4p -7a ) and weekends on [email or web address]  Patient seen and examined with Tonye BecketAmy Clegg, NP. We discussed all aspects of the encounter. I agree with the assessment and plan as  stated above.   CT images reviewed personally. Severe emphysema with moderate sized R pleural effusion with RLL compression.   He remains very symptomatic in setting of end-stage respiratory failure which is multifactorial. There is little left to offer him at this point aside from attempt at DC cardioversion to try and restore NSR. He would likely be high risk for recurrent AF or AFL and not candidate for amio or Tikosyn. Best option likely flecainide. Could also consider palliative thoracentesis of R effusion but would have to stop anticoagulation and thus would not be able to do DC-CV so these are essentially mutually exclusive. I do not think there is any role for RHC at this point. Agree with Palliative Care assessment. D/W Dr. Jarvis NewcomerGrunz. Renal managing lasix and kayexalate.    Jovanka Westgate,MD 1:32 PM

## 2016-07-21 ENCOUNTER — Inpatient Hospital Stay (HOSPITAL_COMMUNITY): Payer: PPO | Admitting: Anesthesiology

## 2016-07-21 ENCOUNTER — Encounter (HOSPITAL_COMMUNITY)
Admission: EM | Disposition: A | Discharge status: Home / Self Care | Payer: Self-pay | Source: Home / Self Care | Attending: Family Medicine

## 2016-07-21 ENCOUNTER — Encounter (HOSPITAL_COMMUNITY): Payer: Self-pay | Admitting: Certified Registered Nurse Anesthetist

## 2016-07-21 DIAGNOSIS — I5031 Acute diastolic (congestive) heart failure: Secondary | ICD-10-CM

## 2016-07-21 HISTORY — PX: CARDIOVERSION: SHX1299

## 2016-07-21 LAB — BASIC METABOLIC PANEL
ANION GAP: 6 (ref 5–15)
BUN: 87 mg/dL — ABNORMAL HIGH (ref 6–20)
CHLORIDE: 104 mmol/L (ref 101–111)
CO2: 23 mmol/L (ref 22–32)
Calcium: 8.4 mg/dL — ABNORMAL LOW (ref 8.9–10.3)
Creatinine, Ser: 3.9 mg/dL — ABNORMAL HIGH (ref 0.61–1.24)
GFR, EST AFRICAN AMERICAN: 16 mL/min — AB (ref >60)
GFR, EST NON AFRICAN AMERICAN: 14 mL/min — AB (ref >60)
Glucose, Bld: 119 mg/dL — ABNORMAL HIGH (ref 65–99)
POTASSIUM: 4.6 mmol/L (ref 3.5–5.1)
SODIUM: 133 mmol/L — AB (ref 135–145)

## 2016-07-21 LAB — PROCALCITONIN

## 2016-07-21 SURGERY — CARDIOVERSION
Anesthesia: General

## 2016-07-21 MED ORDER — SODIUM CHLORIDE 0.9% FLUSH: 3.0000 mL | INTRAVENOUS | Status: DC | PRN

## 2016-07-21 MED ORDER — METOPROLOL SUCCINATE ER 50 MG PO TB24
50.0000 mg | ORAL_TABLET | Freq: Every day | ORAL | Status: DC
  Administered 2016-07-22 – 2016-07-23 (×2): 50 mg via ORAL
  Filled 2016-07-21 (×2): qty 1

## 2016-07-21 MED ORDER — LIDOCAINE HCL (CARDIAC) 20 MG/ML IV SOLN
INTRAVENOUS | Status: DC | PRN
  Administered 2016-07-21: 60 mg via INTRAVENOUS

## 2016-07-21 MED ORDER — SODIUM CHLORIDE 0.9 % IV SOLN: 250.0000 mL | INTRAVENOUS | Status: DC

## 2016-07-21 MED ORDER — DILTIAZEM HCL ER COATED BEADS 120 MG PO CP24
120.0000 mg | ORAL_CAPSULE | Freq: Every day | ORAL | Status: DC
  Administered 2016-07-22 – 2016-07-23 (×2): 120 mg via ORAL
  Filled 2016-07-21 (×2): qty 1

## 2016-07-21 MED ORDER — PREDNISONE 20 MG PO TABS
20.0000 mg | ORAL_TABLET | Freq: Every day | ORAL | Status: AC
  Administered 2016-07-21 – 2016-07-23 (×3): 20 mg via ORAL
  Filled 2016-07-21 (×3): qty 1

## 2016-07-21 MED ORDER — FLECAINIDE ACETATE 100 MG PO TABS
100.0000 mg | ORAL_TABLET | Freq: Two times a day (BID) | ORAL | Status: DC
  Administered 2016-07-21 – 2016-07-23 (×5): 100 mg via ORAL
  Filled 2016-07-21 (×6): qty 1

## 2016-07-21 MED ORDER — PROPOFOL 10 MG/ML IV BOLUS
INTRAVENOUS | Status: DC | PRN
  Administered 2016-07-21: 50 mg via INTRAVENOUS

## 2016-07-21 MED ORDER — SODIUM CHLORIDE 0.9% FLUSH: 3.0000 mL | Freq: Two times a day (BID) | INTRAVENOUS | Status: DC

## 2016-07-21 NOTE — Progress Notes (Signed)
Patient ID: Mark [name], male   DOB: November 18, 1945, 70 y.o.   MRN: [id number]  [facility] Progress Note   Assessment/ Plan:   1. Acute kidney injury and chronic kidney disease stage IV: Appears to be hemodynamically mediated in the setting of CHF exacerbation-unfortunately previous workup of his chronic kidney disease and consistent with chronic cardiorenal syndrome with diastolic heart failure and extensive vascular disease including history of AAA status post repair. He has mild volume overload and small bilateral pleural effusions and we will continue low-dose furosemide. Renal function relatively stable since yesterday although clearly lower than his baseline. Today I had a lengthy discussion with him, his wife and granddaughter about his difficult predicament and high incidence of one-year mortality if started on dialysis. I also informed him that his quality of life would be significantly diminished if he were to start dialysis. I would be very hesitant to start hemodialysis with his history of pulmonary hypertension/heart failure. 2. Hyperkalemia:  corrected with Kayexalate-he informs me that he feels much better after the bowel movements. 3. Hypertension: Blood pressures appear to be marginally elevated, continue current management and re-dose diuretics 4. Acute CHF exacerbation: Plans in place for cardioversion later today for management of his chronic atrial fibrillation/flutter-to be started on flecainide thereafter to try and maintain him in sinus rhythm.  Subjective:   Reports to be feeling somewhat better with regards to shortness of breath. Impressed with Kayexalate/bowel movements yesterday that have reduced abdominal distention.    Objective:   BP (!) 166/87 (BP Location: Left Arm)   Pulse 71   Temp 98.2 F (36.8 C) (Oral)   Resp 18   Ht 5' 6.5" (1.689 m)   Wt 67.7 kg (149 lb 4.8 oz) Comment: scale a  SpO2 93%   BMI 23.74 kg/m   Intake/Output Summary (Last  24 hours) at 07/21/16 [id number] Last data filed at 07/21/16 0818  Gross per 24 hour  Intake              590 ml  Output             1050 ml  Net             -460 ml   Weight change: -0.078 kg (-2.8 oz)  Physical Exam: JXB:JYNWGNFAOZHGen:Comfortable sitting on the edge of his bed, eating breakfast CVS: Pulse irregularly irregular Resp: Diminished breath sounds bilaterally-no rales or rhonchi Abd: Soft, obese, nontender Ext: Trace to 1+ lower extremity edema  Imaging: Ct Chest High Resolution  Result Date: 07/20/2016 CLINICAL DATA:  Shortness of breath, evaluate for pulmonary fibrosis. History of pulmonary hypertension. EXAM: CT CHEST WITHOUT CONTRAST TECHNIQUE: Multidetector CT imaging of the chest was performed following the standard protocol without intravenous contrast. High resolution imaging of the lungs, as well as inspiratory and expiratory imaging, was performed. COMPARISON:  None. FINDINGS: Cardiovascular: Atherosclerotic calcification of the arterial vasculature, including coronary arteries. Pulmonary arteries and heart are mildly enlarged. No pericardial effusion. Mediastinum/Nodes: Anterior scalene lymph nodes measure up to 10 mm on the right. Mediastinal lymph nodes measure up to 1.6 cm in the low right paratracheal station. Hilar regions are difficult to definitively evaluate without IV contrast. No axillary adenopathy. Small lymph nodes are seen along the course of the esophagus. Esophagus is grossly unremarkable. Lungs/Pleura: Moderate to severe centrilobular emphysema. Moderate right pleural effusion with associated posteromedial pleural thickening and collapse/ consolidation in the right lower lobe. Small left pleural effusion with compressive atelectasis in the left lower lobe. Mild  basilar septal thickening. Scattered subpleural nodular densities measure 4 mm or less in size and are likely subpleural lymph nodes. Probable mucoid impaction in the lingula (series 3, image 75). Airway is unremarkable.  Upper Abdomen: Visualized portions of the liver and adrenal glands are grossly unremarkable. Low-attenuation lesions in the kidneys measure up to approximately 4.5 cm on the right but are incompletely imaged. Visualized portions of the spleen, pancreas, stomach and bowel are grossly unremarkable. Musculoskeletal: No worrisome lytic or sclerotic lesions. IMPRESSION: 1. Congestive heart failure with right greater than left pleural effusions. Pleural thickening on the right suggests chronicity. Difficult to exclude malignancy. 2. Collapse/consolidation in the right lower lobe, possibly all compressive in etiology. Pneumonia cannot be excluded. 3. Aortic atherosclerosis (ICD10-170.0). Coronary artery calcification. 4.  Emphysema (ICD10-J43.9). 5. Enlarged pulmonary arteries, indicative of pulmonary arterial hypertension. 6. Mildly enlarged anterior scalene and mediastinal lymph nodes, likely reactive and related to congestive heart failure. Electronically Signed   By: Leanna BattlesMelinda  Blietz M.D.   On: 07/20/2016 08:47   [place] Abdomen Limited  Result Date: 07/19/2016 CLINICAL DATA:  Abdominal distention.  Evaluate for ascites EXAM: LIMITED ABDOMEN ULTRASOUND FOR ASCITES TECHNIQUE: Limited ultrasound survey for ascites was performed in all four abdominal quadrants. COMPARISON:  None. FINDINGS: There is no evidence of ascites. Bilateral pleural effusions are noted. IMPRESSION: No evidence of ascites. Bilateral pleural effusions. Electronically Signed   By: Jolaine ClickArthur  Hoss M.D.   On: 07/19/2016 15:49    Labs: BMET  Recent Labs Lab 07/17/16 1302 07/18/16 0349 07/19/16 0444 07/20/16 0234 07/20/16 1457 07/21/16 0429  NA 138 136 136 133* 136 133*  K 4.7 5.1 5.2* 5.6* 5.1 4.6  CL 106 104 105 103 102 104  CO2 23 24 23 22 23 23   GLUCOSE 176* 210* 148* 140* 165* 119*  BUN 44* 51* 61* 75* 80* 87*  CREATININE 2.98* 3.43* 3.58* 4.03* 4.14* 3.90*  CALCIUM 9.0 9.1 9.2 8.8* 9.0 8.4*   CBC  Recent Labs Lab 07/17/16 1302   WBC 6.1  HGB 11.8*  HCT 37.2*  MCV 93.2  PLT 128*   Medications:    . amLODipine  5 mg Oral Daily  . atorvastatin  10 mg Oral QHS  . diltiazem  240 mg Oral Daily  . doxazosin  4 mg Oral Daily  . famotidine  20 mg Oral Daily  . furosemide  40 mg Intravenous Daily  . levalbuterol  0.63 mg Nebulization TID  . metoprolol succinate  100 mg Oral Daily  . mometasone-formoterol  2 puff Inhalation BID  . predniSONE  20 mg Oral Q breakfast  . Rivaroxaban  15 mg Oral Q supper  . sodium chloride flush  3 mL Intravenous Q12H   Zetta BillsJay Kimetha Trulson, MD 07/21/2016, 9:03 AM

## 2016-07-21 NOTE — CV Procedure (Signed)
     DIRECT CURRENT CARDIOVERSION  NAME:  Mark [name]   MRN: [id number] DOB:  08/24/1946   ADMIT DATE: 07/17/2016   INDICATIONS: Symptomatic Atrial fibrillation    PROCEDURE:   Informed consent was obtained prior to the procedure. The risks, benefits and alternatives for the procedure were discussed and the patient comprehended these risks. Once an appropriate time out was taken, the patient had the defibrillator pads placed in the anterior and posterior position. The patient then underwent sedation by the anesthesia service with propofol 50 mg and lidocaine 60. Once an appropriate level of sedation was achieved, the patient received a single biphasic, synchronized 150J shock with prompt conversion to sinus rhythm. No apparent complications.  Bensimhon, Daniel,MD 1:22 PM

## 2016-07-21 NOTE — Anesthesia Postprocedure Evaluation (Signed)
Anesthesia Post Note  Patient: Hetty BlendHorace T Staten  Procedure(s) Performed: Procedure(s) (LRB): CARDIOVERSION (N/A)  Patient location during evaluation: PACU Anesthesia Type: General Level of consciousness: awake and alert Pain management: pain level controlled Vital Signs Assessment: post-procedure vital signs reviewed and stable Respiratory status: spontaneous breathing, nonlabored ventilation, respiratory function stable and patient connected to nasal cannula oxygen Cardiovascular status: blood pressure returned to baseline and stable Postop Assessment: no signs of nausea or vomiting Anesthetic complications: no    Last Vitals:  Vitals:   07/21/16 1333 07/21/16 1338  BP: 135/82 (!) 149/84  Pulse: 66 67  Resp: 17 13  Temp:      Last Pain:  Vitals:   07/21/16 0445  TempSrc: Oral  PainSc:                  Kalijah Westfall,W. EDMOND

## 2016-07-21 NOTE — Interval H&P Note (Signed)
History and Physical Interval Note:  07/21/2016 1:14 PM  Mark [name]  has presented today for surgery, with the diagnosis of afib  The various methods of treatment have been discussed with the patient and family. After consideration of risks, benefits and other options for treatment, the patient has consented to  Procedure(s): CARDIOVERSION (N/A) as a surgical intervention .  The patient's history has been reviewed, patient examined, no change in status, stable for surgery.  I have reviewed the patient's chart and labs.  Questions were answered to the patient's satisfaction.     Allye Hoyos, Reuel Boomaniel

## 2016-07-21 NOTE — Progress Notes (Addendum)
PROGRESS NOTE  Mark [name]  ZTI:[id number] DOB: 1945-10-03 DOA: 07/17/2016 PCP: Odette Fraction, MD   Brief Narrative: Mark [name] is a 70 y.o. male with medical history significant for abdominal aortic aneurysm status post resection and grafting in 2015, atrial fibrillation on xarelto, CKD stage IV with anemia, dyslipidemia, hypertension, COPD with bronchiectasis and history of recurrent right pleural effusion. He presented with increased cough with thick clear sticky sputum, peripheral edema, weight gain, and dyspnea. On arrival he required 4L by [place], up from intermittent 2L home oxygen. Efforts at diuresis have been met with limited success and worsening renal dysfunction with minimal improvement in end-stage respiratory failure. AFib has continued through admission, so flecainide for rhythm control was started and palliative DCCV was performed 11/4 with return to NSR. Consideration was given to therapeutic thoracentesis characterized on CT chest 11/2, though this was thought to be less likely to relieve symptoms in the long run. Palliative care was consulted. Given that presumptive prognosis with refractory volume status is < 6 months he qualifies for hospice services, but declines this at this time. He remains upbeat about his condition and will accept home palliative services after discharge. His code status has been confirmed to be DNR.  Assessment & Plan: Principal Problem:   Acute on chronic respiratory failure with hypoxia (HCC) Active Problems:   HTN (hypertension)   HLD (hyperlipidemia)   Acute on chronic diastolic CHF (congestive heart failure) (HCC)   COPD GOLD II with marked reversibility    CKD (chronic kidney disease), stage IV (HCC)   PAF (paroxysmal atrial fibrillation) (HCC)   Bronchiectasis with acute exacerbation (HCC)   COPD exacerbation (HCC)   Acute right heart failure   Metabolic acidosis   Anemia in CKD (chronic kidney disease)   Pleural effusion on  right   PAH (pulmonary artery hypertension)   DNR (do not resuscitate)   Palliative care encounter  Acute on chronic respiratory failure with hypoxia: multifactorial respiratory failure related to acute COPD with bronchiectasis and suspected heart failure. - HRCT showed R>L pleural effusion and chronic pleural thickening (malignancy not excluded), and collapse/consolidation in RLL, compressive vs. infiltrate, and background of emphysema. Thoracentesis felt to offer only limited short-term benefit with likelihood of reaccumulation. This would require holding anticoagulation, which would preclude DCCV.  - Maximal medical therapies not very effective. May need increased home oxygen at discharge  End-stage right ventricular failure, Acute on chronic diastolic CHF: refractory to increased lasix doses. Echo with right-sided volume and pressure overload. Suspect cor pulmonale, progressive right-sided heart failure with underlying known severe pulmonary hypertension (PA peak 36mHg). LVEF 50-55% with no wall motion abnormalities. - deescalated diuresis for worsening AKI on CKD: Cr improving. - No ACE inhibitor secondary to chronic kidney disease - Continue preadmission toprol and cardura - No ascites on ultrasound 11/2  COPD GOLD IV with marked reversibility/Bronchiectasis with acute exacerbation: - Continue xopenex. - Solu-Medrol > transition to prednisone - Incentive spirometry - Consideration in the past given to thoracentesis but current effusions not amenable  CKD stage IV: Cr improving with single dose of lasix. Likely cardiorenal syndrome. - Per nephrology, not candidate for HD, and no urgent indications for this. - Hyperkalemia resolved with kayexalate.   HTN (hypertension): Moderate control and suspect volume overload contributing to elevated blood pressure readings - Continue Cardura, Toprol and diltiazem, added norvasc with improvement  Paroxysmal AFib/Flutter: CHADVASc= 4, decreased  xarelto dose for renal impairment. Admission ECG with atrial flutter with variable conduction but otherwise rate  controlled. Etiology is likely RA enlargement due to cor pulmonale. - Has maintained AF/AFL, now in sinus rhythm s/p DCCV 11/4.  - Continue beta blocker, CCB, flecainide - With impaired renal function not a candidate for tikosyn, sotalol. Amiodarone limited by pulm toxicity.    HLD (hyperlipidemia) -Continue Lipitor  Anemia in CKD (chronic kidney disease): Hemoglobin stable and at baseline of around 12 - Monitor with CBC 11/5  DVT prophylaxis: xarelto with GFR-based dosing: may consider return to coumadin with worsening renal function Code Status: Full Family Communication: Wife and granddaughter at bedside this AM Disposition Plan: Ongoing IV diuresis and discharge likely back to home pending improvement.  Consultants:   Cardiology  Nephrology  Palliative care medicine  Procedures:  None  Antimicrobials:  s/p azithromycin x1 10/31   Subjective: Upbeat, stable dyspnea. Denies wheezing, chest pain. Family remains exasperated over failure to improve.  Objective: Vitals:   07/21/16 1338 07/21/16 1343 07/21/16 1348 07/21/16 1353  BP: (!) 149/84 (!) 150/82 (!) 154/84 (!) 146/89  Pulse: 67 67 67 67  Resp: 13 14 18 18   Temp:      TempSrc:      SpO2: 95% 94% 93% 95%  Weight:      Height:        Intake/Output Summary (Last 24 hours) at 07/21/16 1410 Last data filed at 07/21/16 1400  Gross per 24 hour  Intake              243 ml  Output             1100 ml  Net             -857 ml   Filed Weights   07/19/16 0430 07/20/16 0429 07/21/16 0445  Weight: 66.8 kg (147 lb 4.8 oz) 67.8 kg (149 lb 7.6 oz) 67.7 kg (149 lb 4.8 oz)    Examination: General exam: 70 y.o. male in no distress sitting on side of bed. Respiratory system: Mildly labored breathing on 2LPM. Decreased air movement without significant wheezes.  Cardiovascular system: Irregular, II/VI LSB  systolic murmur. No rub, or gallop. + JVD, and 2+ pedal edema. Gastrointestinal system: Abdomen soft, non-tender, non-distended, with normoactive bowel sounds. No organomegaly or masses felt. Central nervous system: Alert and oriented. No focal neurological deficits. Extremities: Warm, no deformities Skin: No rashes, lesions no ulcers Psychiatry: Judgement and insight appear normal. Mood & affect appropriate.   Data Reviewed: I have personally reviewed following labs and imaging studies  CBC:  Recent Labs Lab 07/17/16 1302  WBC 6.1  HGB 11.8*  HCT 37.2*  MCV 93.2  PLT 562*   Basic Metabolic Panel:  Recent Labs Lab 07/17/16 2113 07/18/16 0349 07/19/16 0444 07/20/16 0234 07/20/16 1457 07/21/16 0429  NA  --  136 136 133* 136 133*  K  --  5.1 5.2* 5.6* 5.1 4.6  CL  --  104 105 103 102 104  CO2  --  24 23 22 23 23   GLUCOSE  --  210* 148* 140* 165* 119*  BUN  --  51* 61* 75* 80* 87*  CREATININE  --  3.43* 3.58* 4.03* 4.14* 3.90*  CALCIUM  --  9.1 9.2 8.8* 9.0 8.4*  MG 2.2  --   --   --   --   --    GFR: Estimated Creatinine Clearance: 16 mL/min (by C-G formula based on SCr of 3.9 mg/dL (H)). Liver Function Tests: No results for input(s): AST, ALT, ALKPHOS, BILITOT, PROT, ALBUMIN in  the last 168 hours. No results for input(s): LIPASE, AMYLASE in the last 168 hours. No results for input(s): AMMONIA in the last 168 hours. Coagulation Profile: No results for input(s): INR, PROTIME in the last 168 hours. Cardiac Enzymes: No results for input(s): CKTOTAL, CKMB, CKMBINDEX, TROPONINI in the last 168 hours. BNP (last 3 results)  Recent Labs  05/08/16 1212  PROBNP 1,079.0*   HbA1C: No results for input(s): HGBA1C in the last 72 hours. CBG: No results for input(s): GLUCAP in the last 168 hours. Lipid Profile: No results for input(s): CHOL, HDL, LDLCALC, TRIG, CHOLHDL, LDLDIRECT in the last 72 hours. Thyroid Function Tests: No results for input(s): TSH, T4TOTAL, FREET4,  T3FREE, THYROIDAB in the last 72 hours. Anemia Panel: No results for input(s): VITAMINB12, FOLATE, FERRITIN, TIBC, IRON, RETICCTPCT in the last 72 hours. Urine analysis:    Component Value Date/Time   COLORURINE YELLOW 10/07/2014 1932   APPEARANCEUR CLOUDY (A) 10/07/2014 1932   LABSPEC 1.010 10/07/2014 1932   PHURINE 6.0 10/07/2014 1932   GLUCOSEU NEGATIVE 10/07/2014 1932   HGBUR NEGATIVE 10/07/2014 1932   BILIRUBINUR NEGATIVE 10/07/2014 1932   KETONESUR NEGATIVE 10/07/2014 1932   PROTEINUR NEGATIVE 10/07/2014 1932   UROBILINOGEN 0.2 10/07/2014 1932   NITRITE NEGATIVE 10/07/2014 1932   LEUKOCYTESUR NEGATIVE 10/07/2014 1932   Sepsis Labs: @LABRCNTIP (procalcitonin:4,lacticidven:4)  )No results found for this or any previous visit (from the past 240 hour(s)).   Radiology Studies: Ct Chest High Resolution  Result Date: 07/20/2016 CLINICAL DATA:  Shortness of breath, evaluate for pulmonary fibrosis. History of pulmonary hypertension. EXAM: CT CHEST WITHOUT CONTRAST TECHNIQUE: Multidetector CT imaging of the chest was performed following the standard protocol without intravenous contrast. High resolution imaging of the lungs, as well as inspiratory and expiratory imaging, was performed. COMPARISON:  None. FINDINGS: Cardiovascular: Atherosclerotic calcification of the arterial vasculature, including coronary arteries. Pulmonary arteries and heart are mildly enlarged. No pericardial effusion. Mediastinum/Nodes: Anterior scalene lymph nodes measure up to 10 mm on the right. Mediastinal lymph nodes measure up to 1.6 cm in the low right paratracheal station. Hilar regions are difficult to definitively evaluate without IV contrast. No axillary adenopathy. Small lymph nodes are seen along the course of the esophagus. Esophagus is grossly unremarkable. Lungs/Pleura: Moderate to severe centrilobular emphysema. Moderate right pleural effusion with associated posteromedial pleural thickening and collapse/  consolidation in the right lower lobe. Small left pleural effusion with compressive atelectasis in the left lower lobe. Mild basilar septal thickening. Scattered subpleural nodular densities measure 4 mm or less in size and are likely subpleural lymph nodes. Probable mucoid impaction in the lingula (series 3, image 75). Airway is unremarkable. Upper Abdomen: Visualized portions of the liver and adrenal glands are grossly unremarkable. Low-attenuation lesions in the kidneys measure up to approximately 4.5 cm on the right but are incompletely imaged. Visualized portions of the spleen, pancreas, stomach and bowel are grossly unremarkable. Musculoskeletal: No worrisome lytic or sclerotic lesions. IMPRESSION: 1. Congestive heart failure with right greater than left pleural effusions. Pleural thickening on the right suggests chronicity. Difficult to exclude malignancy. 2. Collapse/consolidation in the right lower lobe, possibly all compressive in etiology. Pneumonia cannot be excluded. 3. Aortic atherosclerosis (ICD10-170.0). Coronary artery calcification. 4.  Emphysema (ICD10-J43.9). 5. Enlarged pulmonary arteries, indicative of pulmonary arterial hypertension. 6. Mildly enlarged anterior scalene and mediastinal lymph nodes, likely reactive and related to congestive heart failure. Electronically Signed   By: Lorin Picket M.D.   On: 07/20/2016 08:47   US Abdomen Limited  Result Date: 07/19/2016 CLINICAL DATA:  Abdominal distention.  Evaluate for ascites EXAM: LIMITED ABDOMEN ULTRASOUND FOR ASCITES TECHNIQUE: Limited ultrasound survey for ascites was performed in all four abdominal quadrants. COMPARISON:  None. FINDINGS: There is no evidence of ascites. Bilateral pleural effusions are noted. IMPRESSION: No evidence of ascites. Bilateral pleural effusions. Electronically Signed   By: Marybelle Killings M.D.   On: 07/19/2016 15:49    Scheduled Meds: . amLODipine  5 mg Oral Daily  . atorvastatin  10 mg Oral QHS  .  diltiazem  240 mg Oral Daily  . doxazosin  4 mg Oral Daily  . famotidine  20 mg Oral Daily  . flecainide  100 mg Oral Q12H  . furosemide  40 mg Intravenous Daily  . levalbuterol  0.63 mg Nebulization TID  . metoprolol succinate  100 mg Oral Daily  . mometasone-formoterol  2 puff Inhalation BID  . predniSONE  20 mg Oral Q breakfast  . Rivaroxaban  15 mg Oral Q supper  . sodium chloride flush  3 mL Intravenous Q12H  . sodium chloride flush  3 mL Intravenous Q12H   Continuous Infusions: . sodium chloride      LOS: 3 days   Time spent: 25 minutes.  Vance Gather, MD [facility] Pager [phone]  If 7PM-7AM, please contact night-coverage [email or web address] Password [email or web address] 07/21/2016, 2:10 PM

## 2016-07-21 NOTE — Anesthesia Preprocedure Evaluation (Addendum)
Anesthesia Evaluation  Patient identified by MRN, date of birth, ID band Patient awake    Reviewed: Allergy & Precautions, H&P , NPO status , Patient's Chart, lab work & pertinent test results, reviewed documented beta blocker date and time   Airway Mallampati: II  TM Distance: >3 FB Neck ROM: Full    Dental no notable dental hx. (+) Edentulous Upper, Edentulous Lower, Dental Advisory Given   Pulmonary COPD,  oxygen dependent, former smoker,  Pulmonary HTN   Pulmonary exam normal breath sounds clear to auscultation       Cardiovascular hypertension, Pt. on medications and Pt. on home beta blockers + Peripheral Vascular Disease and +CHF  + dysrhythmias Atrial Fibrillation + Valvular Problems/Murmurs  Rhythm:Irregular Rate:Normal     Neuro/Psych negative neurological ROS  negative psych ROS   GI/Hepatic Neg liver ROS, GERD  ,  Endo/Other  diabetes  Renal/GU Renal InsufficiencyRenal disease  negative genitourinary   Musculoskeletal  (+) Arthritis , Osteoarthritis,    Abdominal   Peds  Hematology negative hematology ROS (+) anemia ,   Anesthesia Other Findings   Reproductive/Obstetrics negative OB ROS                           Anesthesia Physical Anesthesia Plan  ASA: IV  Anesthesia Plan: General   Post-op Pain Management:    Induction: Intravenous  Airway Management Planned: Mask  Additional Equipment:   Intra-op Plan:   Post-operative Plan:   Informed Consent: I have reviewed the patients History and Physical, chart, labs and discussed the procedure including the risks, benefits and alternatives for the proposed anesthesia with the patient or authorized representative who has indicated his/her understanding and acceptance.   Dental advisory given  Plan Discussed with: CRNA  Anesthesia Plan Comments:         Anesthesia Quick Evaluation

## 2016-07-21 NOTE — Transfer of Care (Signed)
Immediate Anesthesia Transfer of Care Note  Patient: Mark [name]  Procedure(s) Performed: Procedure(s): CARDIOVERSION (N/A)  Patient Location: PACU  Anesthesia Type:General  Level of Consciousness: awake, alert  and oriented  Airway & Oxygen Therapy: Patient Spontanous Breathing and Patient connected to nasal cannula oxygen  Post-op Assessment: Report given to RN and Post -op Vital signs reviewed and stable  Post vital signs: Reviewed and stable  Last Vitals:  Vitals:   07/21/16 1325 07/21/16 1326  BP:    Pulse: 65 65  Resp: (!) 21 17  Temp:      Last Pain:  Vitals:   07/21/16 0445  TempSrc: Oral  PainSc:          Complications: No apparent anesthesia complications

## 2016-07-21 NOTE — H&P (View-Only) (Signed)
Advanced Heart Failure Rounding Note   Subjective:    Feels a bit better today after having BM with Kayexalate. Creatinine slightly better. Weight stable. Talked to Palliative care yesterday. Still in AFL.   Objective:   Weight Range:  Vital Signs:   Temp:  [97.8 F (36.6 C)-98.2 F (36.8 C)] 98.2 F (36.8 C) (11/04 0445) Pulse Rate:  [67-71] 71 (11/04 0445) Resp:  [16-18] 18 (11/04 0445) BP: (166-178)/(77-87) 166/87 (11/04 0445) SpO2:  [92 %-95 %] 93 % (11/04 0809) Weight:  [67.7 kg (149 lb 4.8 oz)] 67.7 kg (149 lb 4.8 oz) (11/04 0445) Last BM Date: 07/20/16  Weight change: Filed Weights   07/19/16 0430 07/20/16 0429 07/21/16 0445  Weight: 66.8 kg (147 lb 4.8 oz) 67.8 kg (149 lb 7.6 oz) 67.7 kg (149 lb 4.8 oz)    Intake/Output:   Intake/Output Summary (Last 24 hours) at 07/21/16 1036 Last data filed at 07/21/16 0923  Gross per 24 hour  Intake              350 ml  Output              900 ml  Net             -550 ml    Physical Exam: General:  Chronically ill apeparing  No resp difficulty. Sitting on the side of the bed HEENT: normal Neck: supple. JVP jaw  . Carotids 2+ bilat; no bruits. No lymphadenopathy or thryomegaly appreciated. Cor: PMI nondisplaced. Irregular rate & rhythm. 2/6 TR RV heave Lungs: very poor air movement  on 2 liters oxygen Abdomen: soft, nontender, distended. No hepatosplenomegaly. No bruits or masses. Good bowel sounds. Extremities: no cyanosis, clubbing, rash, R and LLE 1+ edema . R and LLE ted hose.  Neuro: alert & orientedx3, cranial nerves grossly intact. moves all 4 extremities w/o difficulty. Affect pleasant  Telemetry:  A fib/flutter 70s   Labs: Basic Metabolic Panel:  Recent Labs Lab 07/17/16 2113 07/18/16 0349 07/19/16 0444 07/20/16 0234 07/20/16 1457 07/21/16 0429  NA  --  136 136 133* 136 133*  K  --  5.1 5.2* 5.6* 5.1 4.6  CL  --  104 105 103 102 104  CO2  --  24 23 22 23 23   GLUCOSE  --  210* 148* 140* 165*  119*  BUN  --  51* 61* 75* 80* 87*  CREATININE  --  3.43* 3.58* 4.03* 4.14* 3.90*  CALCIUM  --  9.1 9.2 8.8* 9.0 8.4*  MG 2.2  --   --   --   --   --     Liver Function Tests: No results for input(s): AST, ALT, ALKPHOS, BILITOT, PROT, ALBUMIN in the last 168 hours. No results for input(s): LIPASE, AMYLASE in the last 168 hours. No results for input(s): AMMONIA in the last 168 hours.  CBC:  Recent Labs Lab 07/17/16 1302  WBC 6.1  HGB 11.8*  HCT 37.2*  MCV 93.2  PLT 128*    Cardiac Enzymes: No results for input(s): CKTOTAL, CKMB, CKMBINDEX, TROPONINI in the last 168 hours.  BNP: BNP (last 3 results)  Recent Labs  07/17/16 1302  BNP 915.0*    ProBNP (last 3 results)  Recent Labs  05/08/16 1212  PROBNP 1,079.0*      Other results:  Imaging: Ct Chest High Resolution  Result Date: 07/20/2016 CLINICAL DATA:  Shortness of breath, evaluate for pulmonary fibrosis. History of pulmonary hypertension. EXAM: CT CHEST WITHOUT  CONTRAST TECHNIQUE: Multidetector CT imaging of the chest was performed following the standard protocol without intravenous contrast. High resolution imaging of the lungs, as well as inspiratory and expiratory imaging, was performed. COMPARISON:  None. FINDINGS: Cardiovascular: Atherosclerotic calcification of the arterial vasculature, including coronary arteries. Pulmonary arteries and heart are mildly enlarged. No pericardial effusion. Mediastinum/Nodes: Anterior scalene lymph nodes measure up to 10 mm on the right. Mediastinal lymph nodes measure up to 1.6 cm in the low right paratracheal station. Hilar regions are difficult to definitively evaluate without IV contrast. No axillary adenopathy. Small lymph nodes are seen along the course of the esophagus. Esophagus is grossly unremarkable. Lungs/Pleura: Moderate to severe centrilobular emphysema. Moderate right pleural effusion with associated posteromedial pleural thickening and collapse/ consolidation in  the right lower lobe. Small left pleural effusion with compressive atelectasis in the left lower lobe. Mild basilar septal thickening. Scattered subpleural nodular densities measure 4 mm or less in size and are likely subpleural lymph nodes. Probable mucoid impaction in the lingula (series 3, image 75). Airway is unremarkable. Upper Abdomen: Visualized portions of the liver and adrenal glands are grossly unremarkable. Low-attenuation lesions in the kidneys measure up to approximately 4.5 cm on the right but are incompletely imaged. Visualized portions of the spleen, pancreas, stomach and bowel are grossly unremarkable. Musculoskeletal: No worrisome lytic or sclerotic lesions. IMPRESSION: 1. Congestive heart failure with right greater than left pleural effusions. Pleural thickening on the right suggests chronicity. Difficult to exclude malignancy. 2. Collapse/consolidation in the right lower lobe, possibly all compressive in etiology. Pneumonia cannot be excluded. 3. Aortic atherosclerosis (ICD10-170.0). Coronary artery calcification. 4.  Emphysema (ICD10-J43.9). 5. Enlarged pulmonary arteries, indicative of pulmonary arterial hypertension. 6. Mildly enlarged anterior scalene and mediastinal lymph nodes, likely reactive and related to congestive heart failure. Electronically Signed   By: Leanna BattlesMelinda  Blietz M.D.   On: 07/20/2016 08:47   [place] Abdomen Limited  Result Date: 07/19/2016 CLINICAL DATA:  Abdominal distention.  Evaluate for ascites EXAM: LIMITED ABDOMEN ULTRASOUND FOR ASCITES TECHNIQUE: Limited ultrasound survey for ascites was performed in all four abdominal quadrants. COMPARISON:  None. FINDINGS: There is no evidence of ascites. Bilateral pleural effusions are noted. IMPRESSION: No evidence of ascites. Bilateral pleural effusions. Electronically Signed   By: Jolaine ClickArthur  Hoss M.D.   On: 07/19/2016 15:49     Medications:     Scheduled Medications: . amLODipine  5 mg Oral Daily  . atorvastatin  10 mg Oral  QHS  . diltiazem  240 mg Oral Daily  . doxazosin  4 mg Oral Daily  . famotidine  20 mg Oral Daily  . flecainide  100 mg Oral Q12H  . furosemide  40 mg Intravenous Daily  . levalbuterol  0.63 mg Nebulization TID  . metoprolol succinate  100 mg Oral Daily  . mometasone-formoterol  2 puff Inhalation BID  . predniSONE  20 mg Oral Q breakfast  . Rivaroxaban  15 mg Oral Q supper  . 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 Acute/Chronic Diastolic Heart Failure- ECHO EF 60% Peak PA pressure 59 mg Hg 3. A flutter 4. PAF- S/P DC-CV 2015 and 2017  5. AKI/CKD- Stage IV 6. Hyperkalemia  Plan/Discussion:     CT images reviewed personally and discussed with Dr. Marchelle Gearingamaswamy. Severe  emphysema with small to moderate sized R pleural effusion with RLL compression. Pulmonary does not feel he would benefit much from thoracentesis and will likely come back.   I think best chance of making him feel better will be to proceed with DC-CV of AF as he seems to have felt much better after previous DC-CV. Will use flecainide to help maintain NSR. Continue Xarelto.   Renal function improved mildly.   Overall agree with palliative Care for end-stage respiratory failure which is multifactorial.  I do not think there is any role for RHC at this point.   Renal managing volume status and A/CKD  DC-CV discussed with patient and his wife in detail.    D/W Dr. Jarvis NewcomerGrunz.   Bensimhon, Daniel,MD 10:36 AM Advanced Heart Failure Team Pager [phone] (M-F; 7a - 4p)  Please contact [facility] for night-coverage after hours (4p -7a ) and weekends on [email or web address]

## 2016-07-21 NOTE — Progress Notes (Signed)
Advanced Heart Failure Rounding Note   Subjective:    Feels a bit better today after having BM with Kayexalate. Creatinine slightly better. Weight stable. Talked to Palliative care yesterday. Still in AFL.   Objective:   Weight Range:  Vital Signs:   Temp:  [97.8 F (36.6 C)-98.2 F (36.8 C)] 98.2 F (36.8 C) (11/04 0445) Pulse Rate:  [67-71] 71 (11/04 0445) Resp:  [16-18] 18 (11/04 0445) BP: (166-178)/(77-87) 166/87 (11/04 0445) SpO2:  [92 %-95 %] 93 % (11/04 0809) Weight:  [67.7 kg (149 lb 4.8 oz)] 67.7 kg (149 lb 4.8 oz) (11/04 0445) Last BM Date: 07/20/16  Weight change: Filed Weights   07/19/16 0430 07/20/16 0429 07/21/16 0445  Weight: 66.8 kg (147 lb 4.8 oz) 67.8 kg (149 lb 7.6 oz) 67.7 kg (149 lb 4.8 oz)    Intake/Output:   Intake/Output Summary (Last 24 hours) at 07/21/16 1036 Last data filed at 07/21/16 0923  Gross per 24 hour  Intake              350 ml  Output              900 ml  Net             -550 ml    Physical Exam: General:  Chronically ill apeparing  No resp difficulty. Sitting on the side of the bed HEENT: normal Neck: supple. JVP jaw  . Carotids 2+ bilat; no bruits. No lymphadenopathy or thryomegaly appreciated. Cor: PMI nondisplaced. Irregular rate & rhythm. 2/6 TR RV heave Lungs: very poor air movement  on 2 liters oxygen Abdomen: soft, nontender, distended. No hepatosplenomegaly. No bruits or masses. Good bowel sounds. Extremities: no cyanosis, clubbing, rash, R and LLE 1+ edema . R and LLE ted hose.  Neuro: alert & orientedx3, cranial nerves grossly intact. moves all 4 extremities w/o difficulty. Affect pleasant  Telemetry:  A fib/flutter 70s   Labs: Basic Metabolic Panel:  Recent Labs Lab 07/17/16 2113 07/18/16 0349 07/19/16 0444 07/20/16 0234 07/20/16 1457 07/21/16 0429  NA  --  136 136 133* 136 133*  K  --  5.1 5.2* 5.6* 5.1 4.6  CL  --  104 105 103 102 104  CO2  --  24 23 22 23 23   GLUCOSE  --  210* 148* 140* 165*  119*  BUN  --  51* 61* 75* 80* 87*  CREATININE  --  3.43* 3.58* 4.03* 4.14* 3.90*  CALCIUM  --  9.1 9.2 8.8* 9.0 8.4*  MG 2.2  --   --   --   --   --     Liver Function Tests: No results for input(s): AST, ALT, ALKPHOS, BILITOT, PROT, ALBUMIN in the last 168 hours. No results for input(s): LIPASE, AMYLASE in the last 168 hours. No results for input(s): AMMONIA in the last 168 hours.  CBC:  Recent Labs Lab 07/17/16 1302  WBC 6.1  HGB 11.8*  HCT 37.2*  MCV 93.2  PLT 128*    Cardiac Enzymes: No results for input(s): CKTOTAL, CKMB, CKMBINDEX, TROPONINI in the last 168 hours.  BNP: BNP (last 3 results)  Recent Labs  07/17/16 1302  BNP 915.0*    ProBNP (last 3 results)  Recent Labs  05/08/16 1212  PROBNP 1,079.0*      Other results:  Imaging: Ct Chest High Resolution  Result Date: 07/20/2016 CLINICAL DATA:  Shortness of breath, evaluate for pulmonary fibrosis. History of pulmonary hypertension. EXAM: CT CHEST WITHOUT  CONTRAST TECHNIQUE: Multidetector CT imaging of the chest was performed following the standard protocol without intravenous contrast. High resolution imaging of the lungs, as well as inspiratory and expiratory imaging, was performed. COMPARISON:  None. FINDINGS: Cardiovascular: Atherosclerotic calcification of the arterial vasculature, including coronary arteries. Pulmonary arteries and heart are mildly enlarged. No pericardial effusion. Mediastinum/Nodes: Anterior scalene lymph nodes measure up to 10 mm on the right. Mediastinal lymph nodes measure up to 1.6 cm in the low right paratracheal station. Hilar regions are difficult to definitively evaluate without IV contrast. No axillary adenopathy. Small lymph nodes are seen along the course of the esophagus. Esophagus is grossly unremarkable. Lungs/Pleura: Moderate to severe centrilobular emphysema. Moderate right pleural effusion with associated posteromedial pleural thickening and collapse/ consolidation in  the right lower lobe. Small left pleural effusion with compressive atelectasis in the left lower lobe. Mild basilar septal thickening. Scattered subpleural nodular densities measure 4 mm or less in size and are likely subpleural lymph nodes. Probable mucoid impaction in the lingula (series 3, image 75). Airway is unremarkable. Upper Abdomen: Visualized portions of the liver and adrenal glands are grossly unremarkable. Low-attenuation lesions in the kidneys measure up to approximately 4.5 cm on the right but are incompletely imaged. Visualized portions of the spleen, pancreas, stomach and bowel are grossly unremarkable. Musculoskeletal: No worrisome lytic or sclerotic lesions. IMPRESSION: 1. Congestive heart failure with right greater than left pleural effusions. Pleural thickening on the right suggests chronicity. Difficult to exclude malignancy. 2. Collapse/consolidation in the right lower lobe, possibly all compressive in etiology. Pneumonia cannot be excluded. 3. Aortic atherosclerosis (ICD10-170.0). Coronary artery calcification. 4.  Emphysema (ICD10-J43.9). 5. Enlarged pulmonary arteries, indicative of pulmonary arterial hypertension. 6. Mildly enlarged anterior scalene and mediastinal lymph nodes, likely reactive and related to congestive heart failure. Electronically Signed   By: Leanna BattlesMelinda  Blietz M.D.   On: 07/20/2016 08:47   [place] Abdomen Limited  Result Date: 07/19/2016 CLINICAL DATA:  Abdominal distention.  Evaluate for ascites EXAM: LIMITED ABDOMEN ULTRASOUND FOR ASCITES TECHNIQUE: Limited ultrasound survey for ascites was performed in all four abdominal quadrants. COMPARISON:  None. FINDINGS: There is no evidence of ascites. Bilateral pleural effusions are noted. IMPRESSION: No evidence of ascites. Bilateral pleural effusions. Electronically Signed   By: Jolaine ClickArthur  Hoss M.D.   On: 07/19/2016 15:49     Medications:     Scheduled Medications: . amLODipine  5 mg Oral Daily  . atorvastatin  10 mg Oral  QHS  . diltiazem  240 mg Oral Daily  . doxazosin  4 mg Oral Daily  . famotidine  20 mg Oral Daily  . flecainide  100 mg Oral Q12H  . furosemide  40 mg Intravenous Daily  . levalbuterol  0.63 mg Nebulization TID  . metoprolol succinate  100 mg Oral Daily  . mometasone-formoterol  2 puff Inhalation BID  . predniSONE  20 mg Oral Q breakfast  . Rivaroxaban  15 mg Oral Q supper  . 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 Acute/Chronic Diastolic Heart Failure- ECHO EF 60% Peak PA pressure 59 mg Hg 3. A flutter 4. PAF- S/P DC-CV 2015 and 2017  5. AKI/CKD- Stage IV 6. Hyperkalemia  Plan/Discussion:     CT images reviewed personally and discussed with Dr. Marchelle Gearingamaswamy. Severe  emphysema with small to moderate sized R pleural effusion with RLL compression. Pulmonary does not feel he would benefit much from thoracentesis and will likely come back.   I think best chance of making him feel better will be to proceed with DC-CV of AF as he seems to have felt much better after previous DC-CV. Will use flecainide to help maintain NSR. Continue Xarelto.   Renal function improved mildly.   Overall agree with palliative Care for end-stage respiratory failure which is multifactorial.  I do not think there is any role for RHC at this point.   Renal managing volume status and A/CKD  DC-CV discussed with patient and his wife in detail.    D/W Dr. Jarvis NewcomerGrunz.   Ayeden Gladman,MD 10:36 AM Advanced Heart Failure Team Pager [phone] (M-F; 7a - 4p)  Please contact [facility] for night-coverage after hours (4p -7a ) and weekends on [email or web address]

## 2016-07-22 LAB — CBC
HCT: 32.5 % — ABNORMAL LOW (ref 39.0–52.0)
HEMOGLOBIN: 10.5 g/dL — AB (ref 13.0–17.0)
MCH: 29 pg (ref 26.0–34.0)
MCHC: 32.3 g/dL (ref 30.0–36.0)
MCV: 89.8 fL (ref 78.0–100.0)
PLATELETS: 135 10*3/uL — AB (ref 150–400)
RBC: 3.62 MIL/uL — AB (ref 4.22–5.81)
RDW: 15.8 % — ABNORMAL HIGH (ref 11.5–15.5)
WBC: 5.2 10*3/uL (ref 4.0–10.5)

## 2016-07-22 LAB — BASIC METABOLIC PANEL
ANION GAP: 10 (ref 5–15)
BUN: 88 mg/dL — ABNORMAL HIGH (ref 6–20)
CALCIUM: 8.4 mg/dL — AB (ref 8.9–10.3)
CO2: 24 mmol/L (ref 22–32)
CREATININE: 3.81 mg/dL — AB (ref 0.61–1.24)
Chloride: 105 mmol/L (ref 101–111)
GFR, EST AFRICAN AMERICAN: 17 mL/min — AB (ref >60)
GFR, EST NON AFRICAN AMERICAN: 15 mL/min — AB (ref >60)
Glucose, Bld: 176 mg/dL — ABNORMAL HIGH (ref 65–99)
Potassium: 4.3 mmol/L (ref 3.5–5.1)
SODIUM: 139 mmol/L (ref 135–145)

## 2016-07-22 MED ORDER — FUROSEMIDE 40 MG PO TABS
40.0000 mg | ORAL_TABLET | Freq: Two times a day (BID) | ORAL | Status: DC
  Administered 2016-07-22 – 2016-07-23 (×3): 40 mg via ORAL
  Filled 2016-07-22 (×3): qty 1

## 2016-07-22 MED ORDER — AMLODIPINE BESYLATE 10 MG PO TABS
10.0000 mg | ORAL_TABLET | Freq: Every day | ORAL | Status: DC
  Administered 2016-07-23: 10 mg via ORAL
  Filled 2016-07-22: qty 1

## 2016-07-22 NOTE — Progress Notes (Signed)
Patient ID: Mark [name], male   DOB: 06/19/1946, 70 y.o.   MRN: [id number]  [facility] Progress Note   Assessment/ Plan:   1. Acute kidney injury and chronic kidney disease stage IV: With chronic cardiorenal syndrome and admitted with acute CHF exacerbation with acute worsening of renal function-compounded by RAS activation of diuretic therapy. Unimpressive urine output overnight although he insists that it might have been an under collection. He denies any uremic symptoms and does not have tremor/asterixis. We discussed yet again the overwhelmingly high 1 year mortality if he were to start dialysis with his burden of comorbidities-also informed him that I would recommend a palliative care track rather than aggressive intervention with RRT. Volume status a little on the hypervolemic side, will convert from intravenous furosemide to oral furosemide 40 mg twice a day (he is aware of how to start decreasing this based on his weight to 40 mg once a day). 2. Hyperkalemia:  corrected with Kayexalate-discussed low potassium diet. 3. Hypertension: Blood pressures appear to be marginally elevated, continue current management and re-dose diuretics 4. Acute CHF exacerbation: Status post cardioversion yesterday with conversion to sinus rhythm. Symptomatically reports to be somewhat feeling better with regards to shortness of breath but exclaims that he is weak-- advised to ambulate around hallways with some assistance today.  Subjective:   Status post successful cardioversion yesterday with conversion to sinus rhythm after a single biphasic synchronized 150J shock.    Objective:   BP (!) 167/78 (BP Location: Left Arm)   Pulse 68   Temp 97.8 F (36.6 C) (Oral)   Resp 18   Ht 5' 6.5" (1.689 m)   Wt 67.8 kg (149 lb 6.4 oz) Comment: scale a  SpO2 93%   BMI 23.75 kg/m   Intake/Output Summary (Last 24 hours) at 07/22/16 0848 Last data filed at 07/22/16 [id number]  Gross per 24 hour  Intake               483 ml  Output              875 ml  Net             -392 ml     Weight change: 0.045 kg (1.6 oz)  Physical Exam: JXB:[id number]:Comfortable sitting on the edge of his bed, eating breakfast CVS: Pulse regular rhythm, normal rate Resp: Diminished breath sounds bilaterally-no rales or rhonchi Abd: Soft, obese, nontender Ext: Trace to 1+ lower extremity edema over lower legs and ankles  Imaging: No results found.  Labs: BMET  Recent Labs Lab 07/17/16 1302 07/18/16 0349 07/19/16 0444 07/20/16 0234 07/20/16 1457 07/21/16 0429 07/22/16 0309  NA 138 136 136 133* 136 133* 139  K 4.7 5.1 5.2* 5.6* 5.1 4.6 4.3  CL 106 104 105 103 102 104 105  CO2 23 24 23 22 23 23 24   GLUCOSE 176* 210* 148* 140* 165* 119* 176*  BUN 44* 51* 61* 75* 80* 87* 88*  CREATININE 2.98* 3.43* 3.58* 4.03* 4.14* 3.90* 3.81*  CALCIUM 9.0 9.1 9.2 8.8* 9.0 8.4* 8.4*   CBC  Recent Labs Lab 07/17/16 1302 07/22/16 0309  WBC 6.1 5.2  HGB 11.8* 10.5*  HCT 37.2* 32.5*  MCV 93.2 89.8  PLT 128* 135*   Medications:    . amLODipine  5 mg Oral Daily  . atorvastatin  10 mg Oral QHS  . diltiazem  120 mg Oral Daily  . doxazosin  4 mg Oral Daily  . famotidine  20  mg Oral Daily  . flecainide  100 mg Oral Q12H  . furosemide  40 mg Intravenous Daily  . levalbuterol  0.63 mg Nebulization TID  . metoprolol succinate  50 mg Oral Daily  . mometasone-formoterol  2 puff Inhalation BID  . predniSONE  20 mg Oral Q breakfast  . Rivaroxaban  15 mg Oral Q supper  . sodium chloride flush  3 mL Intravenous Q12H   Zetta BillsJay Georgeanna Radziewicz, MD 07/22/2016, 8:48 AM

## 2016-07-22 NOTE — Progress Notes (Signed)
PROGRESS NOTE  Mark [name]  QZE:[id number] DOB: 18-Jan-1946 DOA: 07/17/2016 PCP: Odette Fraction, MD   Brief Narrative: Mark [name] is a 70 y.o. male with medical history significant for abdominal aortic aneurysm status post resection and grafting in 2015, atrial fibrillation on xarelto, CKD stage IV with anemia, dyslipidemia, hypertension, COPD with bronchiectasis and history of recurrent right pleural effusion. He presented with increased cough with thick clear sticky sputum, peripheral edema, weight gain, and dyspnea. On arrival he required 4L by [place], up from intermittent 2L home oxygen. Efforts at diuresis have been met with limited success and worsening renal dysfunction with minimal improvement in end-stage respiratory failure. AFib has continued through admission, so flecainide for rhythm control was started and palliative DCCV was performed 11/4 with return to NSR. Consideration was given to therapeutic thoracentesis characterized on CT chest 11/2, though this was thought to be less likely to relieve symptoms in the long run and would require discontinuing anticoagulation. Palliative care was consulted. Given that presumptive prognosis with refractory volume status is < 6 months he qualifies for hospice services, but declines this at this time. He remains upbeat about his condition and will accept home palliative services after discharge. His code status has been confirmed to be DNR. Anticipate D/C 11/6 AM if remains stable.  Assessment & Plan: Principal Problem:   Acute on chronic respiratory failure with hypoxia (HCC) Active Problems:   HTN (hypertension)   HLD (hyperlipidemia)   Acute on chronic diastolic CHF (congestive heart failure) (HCC)   COPD GOLD II with marked reversibility    CKD (chronic kidney disease), stage IV (HCC)   PAF (paroxysmal atrial fibrillation) (HCC)   Bronchiectasis with acute exacerbation (HCC)   COPD exacerbation (HCC)   Acute right heart failure  Metabolic acidosis   Anemia in CKD (chronic kidney disease)   Pleural effusion on right   PAH (pulmonary artery hypertension)   DNR (do not resuscitate)   Palliative care encounter  Acute on chronic respiratory failure with hypoxia: multifactorial respiratory failure related to acute COPD with bronchiectasis and suspected heart failure. - HRCT showed R>L pleural effusion and chronic pleural thickening (malignancy not excluded), and collapse/consolidation in RLL, compressive vs. infiltrate, and background of emphysema. Thoracentesis felt to offer only limited short-term benefit with likelihood of reaccumulation. This would require holding anticoagulation, which would preclude DCCV.  - Maximal medical therapies not very effective. May need increased home oxygen at discharge. Will ambulate with pulse oximetry in AM to qualify.  - Ambulate in hallway with assistance today  End-stage right ventricular failure, Acute on chronic diastolic CHF: refractory to increased lasix doses. Echo with right-sided volume and pressure overload. Suspect cor pulmonale, progressive right-sided heart failure with underlying known severe pulmonary hypertension (PA peak 49mHg). LVEF 50-55% with no wall motion abnormalities. - deescalated diuresis for worsening AKI on CKD: Cr improving > transition to po lasix today. - No ACE inhibitor secondary to chronic kidney disease - Continue preadmission toprol and cardura - No ascites on ultrasound 11/2  COPD GOLD IV with marked reversibility/Bronchiectasis with acute exacerbation: - Continue xopenex. - Solu-Medrol > transitioned to prednisone, finished burst - Incentive spirometry - Consideration in the past given to thoracentesis but current effusions not amenable  CKD stage IV: Cr improving with single dose of lasix. Likely cardiorenal syndrome. - Per nephrology, not candidate for HD, and no urgent indications for this. - Hyperkalemia resolved with kayexalate.   HTN  (hypertension): Moderate control and suspect volume overload contributing to elevated  blood pressure readings - Continue Cardura, Toprol and diltiazem, added norvasc with improvement, will increase dose today  Paroxysmal AFib/Flutter: CHADVASc= 4, decreased xarelto dose for renal impairment. Admission ECG with atrial flutter with variable conduction but otherwise rate controlled. Etiology is likely RA enlargement due to cor pulmonale. - Has maintained AF/AFL, now in sinus rhythm s/p DCCV 11/4.  - Continue beta blocker, CCB, flecainide - With impaired renal function not a candidate for tikosyn, sotalol. Amiodarone limited by pulm toxicity.    HLD (hyperlipidemia) -Continue Lipitor  Anemia in CKD (chronic kidney disease): Hemoglobin stable and at baseline of around 12 - Monitor with CBC 11/5  DVT prophylaxis: xarelto with GFR-based dosing: may consider return to coumadin with worsening renal function Code Status: Full Family Communication: Wife and granddaughter at bedside this AM Disposition Plan: Diuresis transitioned to po today, anticipate D/C home if stable in AM.  Consultants:   Cardiology  Nephrology  Palliative care medicine  Procedures:  None  Antimicrobials:  s/p azithromycin x1 10/31   Subjective: Upbeat, stable dyspnea. Denies wheezing, chest pain.  Objective: Vitals:   07/22/16 0916 07/22/16 0917 07/22/16 1036 07/22/16 1214  BP:   (!) 169/80 (!) 163/75  Pulse:   66 66  Resp:    20  Temp:    97.9 F (36.6 C)  TempSrc:    Oral  SpO2: 93% 93% 91% 90%  Weight:      Height:        Intake/Output Summary (Last 24 hours) at 07/22/16 1309 Last data filed at 07/22/16 0851  Gross per 24 hour  Intake              720 ml  Output              675 ml  Net               45 ml   Filed Weights   07/20/16 0429 07/21/16 0445 07/22/16 0502  Weight: 67.8 kg (149 lb 7.6 oz) 67.7 kg (149 lb 4.8 oz) 67.8 kg (149 lb 6.4 oz)    Examination: General exam: 70 y.o. male  in no distress laying flat in bed Respiratory system: Nonlabored breathing on 2LPM. Decreased air movement without significant wheezes.  Cardiovascular system: Irregular, II/VI LSB systolic murmur. No rub, or gallop. + JVD, and 2+ pedal edema. Gastrointestinal system: Abdomen soft, non-tender, non-distended, with normoactive bowel sounds. No organomegaly or masses felt. Central nervous system: Alert and oriented. No focal neurological deficits. Extremities: Warm, no deformities Skin: No rashes, lesions no ulcers Psychiatry: Judgement and insight appear normal. Mood & affect appropriate.   Data Reviewed: I have personally reviewed following labs and imaging studies  CBC:  Recent Labs Lab 07/17/16 1302 07/22/16 0309  WBC 6.1 5.2  HGB 11.8* 10.5*  HCT 37.2* 32.5*  MCV 93.2 89.8  PLT 128* 876*   Basic Metabolic Panel:  Recent Labs Lab 07/17/16 2113  07/19/16 0444 07/20/16 0234 07/20/16 1457 07/21/16 0429 07/22/16 0309  NA  --   < > 136 133* 136 133* 139  K  --   < > 5.2* 5.6* 5.1 4.6 4.3  CL  --   < > 105 103 102 104 105  CO2  --   < > _0 GLUCOSE  --   < > 148* 140* 165* 119* 176*  BUN  --   < > 61* 75* 80* 87* 88*  CREATININE  --   < > 3.58*  4.03* 4.14* 3.90* 3.81*  CALCIUM  --   < > 9.2 8.8* 9.0 8.4* 8.4*  MG 2.2  --   --   --   --   --   --   < > = values in this interval not displayed. GFR: Estimated Creatinine Clearance: 16.3 mL/min (by C-G formula based on SCr of 3.81 mg/dL (H)). Liver Function Tests: No results for input(s): AST, ALT, ALKPHOS, BILITOT, PROT, ALBUMIN in the last 168 hours. No results for input(s): LIPASE, AMYLASE in the last 168 hours. No results for input(s): AMMONIA in the last 168 hours. Coagulation Profile: No results for input(s): INR, PROTIME in the last 168 hours. Cardiac Enzymes: No results for input(s): CKTOTAL, CKMB, CKMBINDEX, TROPONINI in the last 168 hours. BNP (last 3 results)  Recent Labs  05/08/16 1212  PROBNP  1,079.0*   HbA1C: No results for input(s): HGBA1C in the last 72 hours. CBG: No results for input(s): GLUCAP in the last 168 hours. Lipid Profile: No results for input(s): CHOL, HDL, LDLCALC, TRIG, CHOLHDL, LDLDIRECT in the last 72 hours. Thyroid Function Tests: No results for input(s): TSH, T4TOTAL, FREET4, T3FREE, THYROIDAB in the last 72 hours. Anemia Panel: No results for input(s): VITAMINB12, FOLATE, FERRITIN, TIBC, IRON, RETICCTPCT in the last 72 hours. Urine analysis:    Component Value Date/Time   COLORURINE YELLOW 10/07/2014 1932   APPEARANCEUR CLOUDY (A) 10/07/2014 1932   LABSPEC 1.010 10/07/2014 1932   PHURINE 6.0 10/07/2014 1932   GLUCOSEU NEGATIVE 10/07/2014 1932   HGBUR NEGATIVE 10/07/2014 1932   BILIRUBINUR NEGATIVE 10/07/2014 1932   KETONESUR NEGATIVE 10/07/2014 1932   PROTEINUR NEGATIVE 10/07/2014 1932   UROBILINOGEN 0.2 10/07/2014 1932   NITRITE NEGATIVE 10/07/2014 1932   LEUKOCYTESUR NEGATIVE 10/07/2014 1932   Sepsis Labs: _0 (procalcitonin:4,lacticidven:4)  )No results found for this or any previous visit (from the past 240 hour(s)).   Radiology Studies: No results found.  Scheduled Meds: . amLODipine  5 mg Oral Daily  . atorvastatin  10 mg Oral QHS  . diltiazem  120 mg Oral Daily  . doxazosin  4 mg Oral Daily  . famotidine  20 mg Oral Daily  . flecainide  100 mg Oral Q12H  . furosemide  40 mg Oral BID  . levalbuterol  0.63 mg Nebulization TID  . metoprolol succinate  50 mg Oral Daily  . mometasone-formoterol  2 puff Inhalation BID  . predniSONE  20 mg Oral Q breakfast  . Rivaroxaban  15 mg Oral Q supper  . sodium chloride flush  3 mL Intravenous Q12H   Continuous Infusions:   LOS: 4 days   Time spent: 25 minutes.  Vance Gather, MD [facility] Pager [phone]  If 7PM-7AM, please contact night-coverage [email or web address] Password [email or web address] 07/22/2016, 1:09 PM

## 2016-07-22 NOTE — Progress Notes (Signed)
Advanced Heart Failure Rounding Note   Subjective:    Underwent successful DC-CV on 11/4. Thinks breathing is a bit better. Creatinine improved slightly. Weight stable.   Objective:   Weight Range:  Vital Signs:   Temp:  [97.7 F (36.5 C)-98.2 F (36.8 C)] 97.8 F (36.6 C) (11/05 0502) Pulse Rate:  [59-73] 66 (11/05 1036) Resp:  [13-21] 18 (11/05 0502) BP: (134-172)/(78-97) 169/80 (11/05 1036) SpO2:  [90 %-100 %] 91 % (11/05 1036) Weight:  [67.8 kg (149 lb 6.4 oz)] 67.8 kg (149 lb 6.4 oz) (11/05 0502) Last BM Date: 07/20/16  Weight change: Filed Weights   07/20/16 0429 07/21/16 0445 07/22/16 0502  Weight: 67.8 kg (149 lb 7.6 oz) 67.7 kg (149 lb 4.8 oz) 67.8 kg (149 lb 6.4 oz)    Intake/Output:   Intake/Output Summary (Last 24 hours) at 07/22/16 1147 Last data filed at 07/22/16 0851  Gross per 24 hour  Intake              720 ml  Output              675 ml  Net               45 ml    Physical Exam: General:   No resp difficulty. Sitting on the side of the bed HEENT: normal Neck: supple. JVP 9-10  . Carotids 2+ bilat; no bruits. No lymphadenopathy or thryomegaly appreciated. Cor: PMI nondisplaced. Regular rate & rhythm. 2/6 TR RV heave Lungs: poor air movement  on 2 liters oxygen Abdomen: soft, nontender, distended. No hepatosplenomegaly. No bruits or masses. Good bowel sounds. Extremities: no cyanosis, clubbing, rash, R and LLE trace edema . R and LLE ted hose.  Neuro: alert & orientedx3, cranial nerves grossly intact. moves all 4 extremities w/o difficulty. Affect pleasant  Telemetry:  NSR  Labs: Basic Metabolic Panel:  Recent Labs Lab 07/17/16 2113  07/19/16 0444 07/20/16 0234 07/20/16 1457 07/21/16 0429 07/22/16 0309  NA  --   < > 136 133* 136 133* 139  K  --   < > 5.2* 5.6* 5.1 4.6 4.3  CL  --   < > 105 103 102 104 105  CO2  --   < > 23 22 23 23 24   GLUCOSE  --   < > 148* 140* 165* 119* 176*  BUN  --   < > 61* 75* 80* 87* 88*  CREATININE   --   < > 3.58* 4.03* 4.14* 3.90* 3.81*  CALCIUM  --   < > 9.2 8.8* 9.0 8.4* 8.4*  MG 2.2  --   --   --   --   --   --   < > = values in this interval not displayed.  Liver Function Tests: No results for input(s): AST, ALT, ALKPHOS, BILITOT, PROT, ALBUMIN in the last 168 hours. No results for input(s): LIPASE, AMYLASE in the last 168 hours. No results for input(s): AMMONIA in the last 168 hours.  CBC:  Recent Labs Lab 07/17/16 1302 07/22/16 0309  WBC 6.1 5.2  HGB 11.8* 10.5*  HCT 37.2* 32.5*  MCV 93.2 89.8  PLT 128* 135*    Cardiac Enzymes: No results for input(s): CKTOTAL, CKMB, CKMBINDEX, TROPONINI in the last 168 hours.  BNP: BNP (last 3 results)  Recent Labs  07/17/16 1302  BNP 915.0*    ProBNP (last 3 results)  Recent Labs  05/08/16 1212  PROBNP 1,079.0*  Other results:  Imaging: No results found.   Medications:     Scheduled Medications: . amLODipine  5 mg Oral Daily  . atorvastatin  10 mg Oral QHS  . diltiazem  120 mg Oral Daily  . doxazosin  4 mg Oral Daily  . famotidine  20 mg Oral Daily  . flecainide  100 mg Oral Q12H  . furosemide  40 mg Oral BID  . levalbuterol  0.63 mg Nebulization TID  . metoprolol succinate  50 mg Oral Daily  . mometasone-formoterol  2 puff Inhalation BID  . predniSONE  20 mg Oral Q breakfast  . Rivaroxaban  15 mg Oral Q supper  . 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 Acute/Chronic Diastolic Heart Failure- ECHO EF 60% Peak PA pressure 59 mg Hg 3. A flutter 4. PAF- S/P DC-CV 2015 and 2017   --s/p DC-CV 11/4  5. AKI/CKD- Stage IV 6. Hyperkalemia  Plan/Discussion:     CT images reviewed personally and discussed with Dr. Marchelle Gearingamaswamy on 11/4. Severe  emphysema with small to moderate sized R pleural effusion with RLL compression. Pulmonary does not feel he would benefit much from thoracentesis and will likely come back.   Back in NSR after DC-CV 11/4. On flecainide to help maintain NSR. Continue Xarelto.   Renal function improved mildly.   Overall agree with palliative Care for end-stage respiratory failure which is multifactorial.  I do not think there is any role for RHC at this point.   Renal managing volume status and A/CKD  Hopefully can go home soon.   Bensimhon, Daniel,MD 11:47 AM Advanced Heart Failure Team Pager [phone] (M-F; 7a - 4p)  Please contact [facility] for night-coverage after hours (4p -7a ) and weekends on [email or web address]

## 2016-07-23 ENCOUNTER — Encounter (HOSPITAL_COMMUNITY): Payer: Self-pay | Admitting: Internal Medicine

## 2016-07-23 LAB — CBC
HCT: 34 % — ABNORMAL LOW (ref 39.0–52.0)
Hemoglobin: 11.2 g/dL — ABNORMAL LOW (ref 13.0–17.0)
MCH: 29.8 pg (ref 26.0–34.0)
MCHC: 32.9 g/dL (ref 30.0–36.0)
MCV: 90.4 fL (ref 78.0–100.0)
PLATELETS: 137 10*3/uL — AB (ref 150–400)
RBC: 3.76 MIL/uL — AB (ref 4.22–5.81)
RDW: 15.6 % — ABNORMAL HIGH (ref 11.5–15.5)
WBC: 7.7 10*3/uL (ref 4.0–10.5)

## 2016-07-23 LAB — BASIC METABOLIC PANEL
Anion gap: 12 (ref 5–15)
BUN: 88 mg/dL — AB (ref 6–20)
CHLORIDE: 105 mmol/L (ref 101–111)
CO2: 21 mmol/L — ABNORMAL LOW (ref 22–32)
CREATININE: 3.78 mg/dL — AB (ref 0.61–1.24)
Calcium: 8.7 mg/dL — ABNORMAL LOW (ref 8.9–10.3)
GFR calc Af Amer: 17 mL/min — ABNORMAL LOW (ref >60)
GFR, EST NON AFRICAN AMERICAN: 15 mL/min — AB (ref >60)
GLUCOSE: 142 mg/dL — AB (ref 65–99)
POTASSIUM: 4.7 mmol/L (ref 3.5–5.1)
Sodium: 138 mmol/L (ref 135–145)

## 2016-07-23 MED ORDER — FUROSEMIDE 40 MG PO TABS: 40.0000 mg | ORAL_TABLET | Freq: Two times a day (BID) | ORAL | 5 refills | Status: DC

## 2016-07-23 MED ORDER — DOXAZOSIN MESYLATE 8 MG PO TABS: 8.0000 mg | ORAL_TABLET | Freq: Every day | ORAL | Status: DC

## 2016-07-23 MED ORDER — DILTIAZEM HCL ER COATED BEADS 180 MG PO CP24: 360.0000 mg | ORAL_CAPSULE | Freq: Every day | ORAL | Status: DC

## 2016-07-23 MED ORDER — FLECAINIDE ACETATE 100 MG PO TABS: 100.0000 mg | ORAL_TABLET | Freq: Two times a day (BID) | ORAL | 0 refills | Status: DC

## 2016-07-23 NOTE — Care Management Note (Signed)
Case Management Note  Patient Details  Name: Mark [name] MRN: [id number] Date of Birth: 18-May-1946  Subjective/Objective:   Adm w resp failure                 Action/Plan: for disch home w md follow up. Has home o2 w ahc. Has number for paliative care if he decides. Does not want hhc.   Expected Discharge Date:                  Expected Discharge Plan:  Home/Self Care  In-House Referral:  Hospice / Palliative Care  Discharge planning Services     Post Acute Care Choice:    Choice offered to:     DME Arranged:    DME Agency:     HH Arranged:  Patient Refused HH Agency:     Status of Service:  Completed, signed off  If discussed at [employer] Length [employer] Stay Meetings, dates discussed:    Additional Comments:dc home. Has port o2 for travel. He does not want hhc. He has number for paliative care if he dicides.   Hanley Haysowell, [place]onea Gaut T, RN 07/23/2016, 1:23 PM

## 2016-07-23 NOTE — Care Management Important Message (Signed)
Important Message  Patient Details  Name: Mark [name] MRN: [id number] Date of Birth: 1946/01/31   Medicare Important Message Given:  Yes    Khalia Gong Stefan ChurchBratton 07/23/2016, 10:56 AM

## 2016-07-23 NOTE — Progress Notes (Signed)
Patient ID: Mark [name], male   DOB: Dec 11, 1945, 70 y.o.   MRN: [id number]  [facility] Progress Note  1.  Subjective:   Holding sinus rhythm post cardioversion on 11/4 I note plans for discharge to home today   Objective:   BP (!) 177/88 (BP Location: Left Arm)   Pulse 67   Temp 97.7 F (36.5 C) (Oral)   Resp 18   Ht 5' 6.5" (1.689 m)   Wt 68.4 kg (150 lb 14.4 oz)   SpO2 91%   BMI 23.99 kg/m   Intake/Output Summary (Last 24 hours) at 07/23/16 1323 Last data filed at 07/23/16 1209  Gross per 24 hour  Intake             1183 ml  Output             1115 ml  Net               68 ml     Weight change: 0.68 kg (1 lb 8 oz)  Physical Exam: Comfortable, NAD Regular rhythm S1S2 No S3 Diminished breath sounds bilaterally - no crackles Abd: Soft, obese, nontender Trace edema   Recent Labs Lab 07/18/16 0349 07/19/16 0444 07/20/16 0234 07/20/16 1457 07/21/16 0429 07/22/16 0309 07/23/16 0928  NA 136 136 133* 136 133* 139 138  K 5.1 5.2* 5.6* 5.1 4.6 4.3 4.7  CL 104 105 103 102 104 105 105  CO2 24 23 22 23 23 24  21*  GLUCOSE 210* 148* 140* 165* 119* 176* 142*  BUN 51* 61* 75* 80* 87* 88* 88*  CREATININE 3.43* 3.58* 4.03* 4.14* 3.90* 3.81* 3.78*  CALCIUM 9.1 9.2 8.8* 9.0 8.4* 8.4* 8.7*     Recent Labs Lab 07/17/16 1302 07/22/16 0309 07/23/16 0928  WBC 6.1 5.2 7.7  HGB 11.8* 10.5* 11.2*  HCT 37.2* 32.5* 34.0*  MCV 93.2 89.8 90.4  PLT 128* 135* 137*   Medications:    . atorvastatin  10 mg Oral QHS  . [START ON 07/24/2016] diltiazem  360 mg Oral Daily  . [START ON 07/24/2016] doxazosin  8 mg Oral Daily  . famotidine  20 mg Oral Daily  . flecainide  100 mg Oral Q12H  . furosemide  40 mg Oral BID  . levalbuterol  0.63 mg Nebulization TID  . mometasone-formoterol  2 puff Inhalation BID  . Rivaroxaban  15 mg Oral Q supper  . sodium chloride flush  3 mL Intravenous Q12H    Assessment/ Plan:    1. Acute kidney injury and chronic kidney  disease stage IV: With chronic cardiorenal syndrome and admitted with acute CHF exacerbation with acute worsening of renal function-compounded by RAS activation of diuretic therapy. No uremic symptoms.  Dr. Allena KatzPatel discussed with him the overwhelmingly high 1 year mortality if he were to start dialysis with his burden of comorbidities- and also informed him that he would recommend a palliative care track rather than aggressive intervention with RRT. He remains upbeat. Renal function stable GFR around 15 2. Hyperkalemia:  corrected with Kayexalate-discussed low potassium diet. 3. Hypertension: Blood pressures appear to be marginally elevated, continue current management and re-dose diuretics 4. Acute CHF exacerbation: Status post cardioversion 07/21/16  and maintaining sinus rhythm. "End stage RV failure" per Dr. Leonard SchwartzB.  5. Disposition: I note plans for discharge today and has followup with Dr. Allena KatzPatel for 11/21. Home dose of lasix planned for 40 BID. HF Clinic suggests possibility of a weekly dose of metolazone - will  defer that to HF service.  Camille Balynthia Khloie Hamada, MD [facility] [phone] Pager 07/23/2016, 1:26 PM

## 2016-07-23 NOTE — Progress Notes (Signed)
Advanced Heart Failure Rounding Note   Subjective:    Underwent successful DC-CV on 11/4. Still in NSR. Breathing much better. Coughed up a big one today.      Objective:   Weight Range:  Vital Signs:   Temp:  [97.8 F (36.6 C)-97.9 F (36.6 C)] 97.9 F (36.6 C) (11/06 0527) Pulse Rate:  [66-68] 68 (11/06 0527) Resp:  [18-20] 18 (11/06 0527) BP: (157-169)/(75-89) 157/89 (11/06 0527) SpO2:  [90 %-95 %] 95 % (11/06 0527) Weight:  [68.4 kg (150 lb 14.4 oz)] 68.4 kg (150 lb 14.4 oz) (11/06 0527) Last BM Date: 07/20/16  Weight change: Filed Weights   07/21/16 0445 07/22/16 0502 07/23/16 0527  Weight: 67.7 kg (149 lb 4.8 oz) 67.8 kg (149 lb 6.4 oz) 68.4 kg (150 lb 14.4 oz)    Intake/Output:   Intake/Output Summary (Last 24 hours) at 07/23/16 0949 Last data filed at 07/23/16 0924  Gross per 24 hour  Intake             1183 ml  Output              940 ml  Net              243 ml    Physical Exam: General:   No resp difficulty. Lying flat in bed  HEENT: normal Neck: supple. JVP 9-10  . Carotids 2+ bilat; no bruits. No lymphadenopathy or thryomegaly appreciated. Cor: PMI nondisplaced. Regular rate & rhythm. 2/6 TR RV heave Lungs: poor air movement  on 2 liters oxygen Abdomen: soft, nontender, distended. No hepatosplenomegaly. No bruits or masses. Good bowel sounds. Extremities: no cyanosis, clubbing, rash, R and LLE 1-2+ edema . R and LLE ted hose.  Neuro: alert & orientedx3, cranial nerves grossly intact. moves all 4 extremities w/o difficulty. Affect pleasant  Telemetry:  NSR  Labs: Basic Metabolic Panel:  Recent Labs Lab 07/17/16 2113  07/19/16 0444 07/20/16 0234 07/20/16 1457 07/21/16 0429 07/22/16 0309  NA  --   < > 136 133* 136 133* 139  K  --   < > 5.2* 5.6* 5.1 4.6 4.3  CL  --   < > 105 103 102 104 105  CO2  --   < > 23 22 23 23 24   GLUCOSE  --   < > 148* 140* 165* 119* 176*  BUN  --   < > 61* 75* 80* 87* 88*  CREATININE  --   < > 3.58* 4.03*  4.14* 3.90* 3.81*  CALCIUM  --   < > 9.2 8.8* 9.0 8.4* 8.4*  MG 2.2  --   --   --   --   --   --   < > = values in this interval not displayed.  Liver Function Tests: No results for input(s): AST, ALT, ALKPHOS, BILITOT, PROT, ALBUMIN in the last 168 hours. No results for input(s): LIPASE, AMYLASE in the last 168 hours. No results for input(s): AMMONIA in the last 168 hours.  CBC:  Recent Labs Lab 07/17/16 1302 07/22/16 0309  WBC 6.1 5.2  HGB 11.8* 10.5*  HCT 37.2* 32.5*  MCV 93.2 89.8  PLT 128* 135*    Cardiac Enzymes: No results for input(s): CKTOTAL, CKMB, CKMBINDEX, TROPONINI in the last 168 hours.  BNP: BNP (last 3 results)  Recent Labs  07/17/16 1302  BNP 915.0*    ProBNP (last 3 results)  Recent Labs  05/08/16 1212  PROBNP 1,079.0*  Other results:  Imaging: No results found.   Medications:     Scheduled Medications: . amLODipine  10 mg Oral Daily  . atorvastatin  10 mg Oral QHS  . diltiazem  120 mg Oral Daily  . doxazosin  4 mg Oral Daily  . famotidine  20 mg Oral Daily  . flecainide  100 mg Oral Q12H  . furosemide  40 mg Oral BID  . levalbuterol  0.63 mg Nebulization TID  . metoprolol succinate  50 mg Oral Daily  . mometasone-formoterol  2 puff Inhalation BID  . Rivaroxaban  15 mg Oral Q supper  . 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 Acute/Chronic Diastolic Heart Failure- ECHO EF 60% Peak PA pressure 59 mg Hg 3. A flutter 4. PAF- S/P DC-CV 2015 and 2017   --s/p DC-CV 11/4  5. AKI/CKD- Stage IV 6. Hyperkalemia  Plan/Discussion:    Back in NSR after DC-CV 11/4. On flecainide to help maintain NSR (flecainide not ideal agent due to possible presence of CAD but unable to  tolerate other AAs and he feels much better in NSR). Continue Xarelto. Would resume diltiazem at 360 daily. Can stop amlodipine. Would also stop metoprolol given degree of RV failure. Can increase doxazosin or add hydralazine for HTN.   Still has volume on board. Renal managing diuretics. Now on lasix 40 po bid (was on 40 daily at home previously). Would consider a weekly dose metolazone 2.5.  Home today. F/u with Dr. Clifton JamesMcalhany.   Bensimhon, Daniel,MD 9:49 AM Advanced Heart Failure Team Pager [phone] (M-F; 7a - 4p)  Please contact [facility] for night-coverage after hours (4p -7a ) and weekends on [email or web address]

## 2016-07-23 NOTE — Progress Notes (Signed)
Pt has orders to be discharged. Discharge instructions given and pt has no additional questions at this time. Medication regimen reviewed and pt educated. Pt verbalized understanding and has no additional questions. Telemetry box removed. IV removed and site in good condition. Pt stable and waiting for transportation.  Assyria Morreale RN 

## 2016-07-23 NOTE — Discharge Summary (Addendum)
Physician Discharge Summary  Mark [name]:[id number] DOB: 1946/06/16 DOA: 07/17/2016  PCP: Odette Fraction, MD  Admit date: 07/17/2016 Discharge date: 07/23/2016  Admitted From: Home Disposition: Home   Recommendations for Outpatient Follow-up:  1. Follow up with nephrology and cardiology as below. 2. Monitor ECG; Maintaining NSR since DCCV 11/4, started flecainide.  3. Please obtain BMP to monitor renal function (CKD IV, not HD candidate). Discharged on lasix 87m BID, previously on lasix 436mpo daily.  4. Consider switch from xarelto (renal dosing) to coumadin if renal function worsens.  5. Monitor BP: consider addition of hydralazine or increase of doxazosin if uncontrolled HTN.  Home Health: Home palliative services Equipment/Devices: 2 L O2  Discharge Condition: Stable CODE STATUS: DNR Diet recommendation: Heart healthy  Brief/Interim Summary: Mark [name] [id number].[email or web address] medical history significant for abdominal aortic aneurysm status post resection and grafting in 2015, atrial fibrillation on xarelto, CKD stage IV with anemia, dyslipidemia, hypertension, COPD with bronchiectasis and history of recurrent right pleural effusion. He presented with increased cough with thick clear sticky sputum, peripheral edema, weight gain, and dyspnea. On arrival he required 4L by [place], up from intermittent 2L home oxygen. Efforts at diuresis have been met with limited success and worsening renal dysfunction with minimal improvement in end-stage respiratory failure. AFib has continued through admission, so flecainide for rhythm control was started and palliative DCCV was performed 11/4 with return to NSR. Consideration was given to therapeutic thoracentesis characterized on CT chest 11/2, though this was thought to be less likely to relieve symptoms in the long run and would require discontinuing anticoagulation. Palliative care was consulted. Given that presumptive prognosis with  refractory volume status is < 6 months he qualifies for hospice services, but declines this at this time. He remains upbeat about his condition and will accept home palliative services after discharge. His code status has been confirmed to be DNR. Creatinine improved to 3.81 after deescalation of diuretic therapy. See below for full discussion of problem-based plans.   Discharge Diagnoses:  Principal Problem:   Acute on chronic respiratory failure with hypoxia (HCC) Active Problems:   HTN (hypertension)   HLD (hyperlipidemia)   Acute on chronic diastolic CHF (congestive heart failure) (HCC)   COPD GOLD II with marked reversibility    CKD (chronic kidney disease), stage IV (HCC)   PAF (paroxysmal atrial fibrillation) (HCC)   Bronchiectasis with acute exacerbation (HCC)   COPD exacerbation (HCC)   Acute right heart failure   Metabolic acidosis   Anemia in CKD (chronic kidney disease)   Pleural effusion on right   PAH (pulmonary artery hypertension)   DNR (do not resuscitate)   Palliative care encounter  Acute on chronic respiratory failure with hypoxia: multifactorial respiratory failure related to acute COPD with bronchiectasis and suspected heart failure. - HRCT showed R>L pleural effusion and chronic pleural thickening (malignancy not excluded), and collapse/consolidation in RLL, compressive vs. infiltrate, and background of emphysema. Thoracentesis felt to offer only limited short-term benefit with likelihood of reaccumulation. This would require holding anticoagulation, which would preclude DCCV.  - Maximal medical therapies not very effective. Palliative services ordered.  - Discharged on 2L home oxygen.   End-stage right ventricular failure, Acute on chronic diastolic CHF: refractory to increased lasix doses. Echo with right-sided volume and pressure overload. Suspect cor pulmonale, progressive right-sided heart failure with underlying known severe pulmonary hypertension (PA peak  7530m). LVEF 50-55% with no wall motion abnormalities. - deescalated diuresis for worsening AKI on CKD:  Cr improving > transition to po lasix 11/5 - No ACE inhibitor secondary to chronic kidney disease - Continue preadmission toprol and cardura - No ascites on ultrasound 11/2  COPD GOLD IV with marked reversibility/Bronchiectasis with acute exacerbation: - Continue xopenex. - Solu-Medrol > transitioned to prednisone, finished burst - Incentive spirometry  CKD stage IV: Cr improving with single dose of lasix. Likely cardiorenal syndrome. - Per nephrology, not candidate for HD, and no urgent indications for this. - Hyperkalemia resolved with kayexalate.   HTN (hypertension): Moderate control and suspect volume overload contributing to elevated blood pressure readings - Continue Cardura, Toprol and diltiazem  Paroxysmal AFib/Flutter: CHADVASc= 4, decreased xarelto dose for renal impairment. Admission ECG with atrial flutter with variable conduction but otherwise rate controlled. Etiology is likely RA enlargement due to cor pulmonale. - Has maintained AF/AFL, now in sinus rhythm s/p DCCV 11/4.  - Continue beta blocker, CCB, flecainide - With impaired renal function not a candidate for tikosyn, sotalol. Amiodarone limited by previous pulm toxicity.   HLD (hyperlipidemia) -Continue Lipitor  Anemia in CKD (chronic kidney disease): Hemoglobin stable and at baseline of around 12 - Monitor with CBC 11/5  Discharge Instructions Discharge Instructions    (HEART FAILURE PATIENTS) Call MD:  Anytime you have any of the following symptoms: 1) 3 pound weight gain in 24 hours or 5 pounds in 1 week 2) shortness of breath, with or without a dry hacking cough 3) swelling in the hands, feet or stomach 4) if you have to sleep on extra pillows at night in order to breathe.    Complete by:  As directed    Call MD for:  difficulty breathing, headache or visual disturbances    Complete by:  As  directed    Diet - low sodium heart healthy    Complete by:  As directed    Discharge instructions    Complete by:  As directed    You were admitted for difficulty breathing due to heart failure and severe, end-stage COPD. Some medications have been changed/added that you need to start on discharge:  - START taking flecainide every 12 hours to help keep your heart in normal rhythm.  - Increase lasix by taking 1 tablet (101m) TWICE DAILY instead of once daily until you follow up as scheduled - If your symptoms worsen, you need to seek medical attention right away.   Increase activity slowly    Complete by:  As directed        Medication List    TAKE these medications   atorvastatin 10 MG tablet Commonly known as:  LIPITOR Take 10 mg by mouth at bedtime.   budesonide-formoterol 80-4.5 MCG/ACT inhaler Commonly known as:  SYMBICORT Inhale 2 puffs into the lungs 2 (two) times daily.   dextromethorphan-guaiFENesin 30-600 MG 12hr tablet Commonly known as:  MUCINEX DM Take 1 tablet by mouth 2 (two) times daily as needed.   diltiazem 180 MG 24 hr capsule Commonly known as:  TIAZAC TAKE 2 CAPSULES BY MOUTH EVERY DAY What changed:  See the new instructions.   doxazosin 4 MG tablet Commonly known as:  CARDURA Take 1 tablet (4 mg total) by mouth daily.   flecainide 100 MG tablet Commonly known as:  TAMBOCOR Take 1 tablet (100 mg total) by mouth every 12 (twelve) hours.   furosemide 40 MG tablet Commonly known as:  LASIX Take 1 tablet (40 mg total) by mouth 2 (two) times daily. What changed:  See the new instructions.  GAS-X PO Take by mouth. Use as needed for gas   metoprolol succinate 100 MG 24 hr tablet Commonly known as:  TOPROL-XL Take 1 tablet (100 mg total) by mouth daily. Take with or immediately following a meal.   OXYGEN Use 2 L at bedtime and as needed for shortness of breath and activity   ranitidine 150 MG capsule Commonly known as:  ZANTAC Take 150 mg by  mouth daily as needed for heartburn.   Rivaroxaban 15 MG Tabs tablet Commonly known as:  XARELTO Take 1 tablet (15 mg total) by mouth daily with supper.   VENTOLIN HFA 108 (90 Base) MCG/ACT inhaler Generic drug:  albuterol INHALE 2 PUFFS INTO THE LUNGS EVERY 6 (SIX) HOURS AS NEEDED FOR WHEEZING OR SHORTNESS OF BREATH. What changed:  See the new instructions.      Follow-up Information    Odette Fraction, MD. Go on 07/27/2016.   Specialty:  Family Medicine Why:  @3 :45pm Contact information: [address] [place] [address] [phone]        Lauree Chandler, MD. Go on 08/06/2016.   Specialty:  Cardiology Why:  @2 :30pm please arrive @2 :15pm with Truitt Merle PA Contact information: [place] [place] [address] [phone]        PATEL,JAY K., MD. Go on 08/07/2016.   Specialty:  Nephrology Why:  @2 :30pm Contact information: [place] [address] [phone]          Allergies  Allergen Reactions  . Niaspan [Niacin Er] Anaphylaxis  . Amiodarone     Stopped due to lung issues    Consultations:  Nephrology, Dr. Posey Pronto  Heart Failure, Dr. Haroldine Laws   Procedures/Studies: Dg Chest 2 View  Result Date: 07/18/2016 CLINICAL DATA:  Follow-up right pleural effusion EXAM: CHEST  2 VIEW COMPARISON:  PA and lateral chest x-ray of July 17, 2016 FINDINGS: The lungs are mildly hyperinflated. There small bilateral pleural effusions which are fairly stable. There is no pneumothorax. No pulmonary parenchymal masses or discrete infiltrates are observed. The heart is top-normal in size. The pulmonary vascularity is not engorged. The interstitial markings remain increased. There is calcification in the wall of the aortic arch. IMPRESSION: COPD. Stable small bilateral pleural effusions. Stable mild interstitial prominence diffusely which may reflect mild interstitial edema or acute on chronic bronchitic changes.  Electronically Signed   By: David  Martinique M.D.   On: 07/18/2016 08:00   Dg Chest 2 View  Result Date: 07/17/2016 CLINICAL DATA:  Shortness of breath. EXAM: CHEST  2 VIEW COMPARISON:  Radiographs of May 08, 2016. FINDINGS: Stable cardiomediastinal silhouette. Atherosclerosis of thoracic aorta is noted. Mild bibasilar edema or atelectasis is noted with mild bilateral pleural effusions. No pneumothorax is noted. Bony thorax is unremarkable. IMPRESSION: Mild bibasilar edema or atelectasis is noted with mild bilateral pleural effusions. Electronically Signed   By: Marijo Conception, M.D.   On: 07/17/2016 15:23   Ct Chest High Resolution  Result Date: 07/20/2016 CLINICAL DATA:  Shortness of breath, evaluate for pulmonary fibrosis. History of pulmonary hypertension. EXAM: CT CHEST WITHOUT CONTRAST TECHNIQUE: Multidetector CT imaging of the chest was performed following the standard protocol without intravenous contrast. High resolution imaging of the lungs, as well as inspiratory and expiratory imaging, was performed. COMPARISON:  None. FINDINGS: Cardiovascular: Atherosclerotic calcification of the arterial vasculature, including coronary arteries. Pulmonary arteries and heart are mildly enlarged. No pericardial effusion. Mediastinum/Nodes: Anterior scalene lymph nodes measure up to 10 mm on  the right. Mediastinal lymph nodes measure up to 1.6 cm in the low right paratracheal station. Hilar regions are difficult to definitively evaluate without IV contrast. No axillary adenopathy. Small lymph nodes are seen along the course of the esophagus. Esophagus is grossly unremarkable. Lungs/Pleura: Moderate to severe centrilobular emphysema. Moderate right pleural effusion with associated posteromedial pleural thickening and collapse/ consolidation in the right lower lobe. Small left pleural effusion with compressive atelectasis in the left lower lobe. Mild basilar septal thickening. Scattered subpleural nodular densities  measure 4 mm or less in size and are likely subpleural lymph nodes. Probable mucoid impaction in the lingula (series 3, image 75). Airway is unremarkable. Upper Abdomen: Visualized portions of the liver and adrenal glands are grossly unremarkable. Low-attenuation lesions in the kidneys measure up to approximately 4.5 cm on the right but are incompletely imaged. Visualized portions of the spleen, pancreas, stomach and bowel are grossly unremarkable. Musculoskeletal: No worrisome lytic or sclerotic lesions. IMPRESSION: 1. Congestive heart failure with right greater than left pleural effusions. Pleural thickening on the right suggests chronicity. Difficult to exclude malignancy. 2. Collapse/consolidation in the right lower lobe, possibly all compressive in etiology. Pneumonia cannot be excluded. 3. Aortic atherosclerosis (ICD10-170.0). Coronary artery calcification. 4.  Emphysema (ICD10-J43.9). 5. Enlarged pulmonary arteries, indicative of pulmonary arterial hypertension. 6. Mildly enlarged anterior scalene and mediastinal lymph nodes, likely reactive and related to congestive heart failure. Electronically Signed   By: Lorin Picket M.D.   On: 07/20/2016 08:47   US Abdomen Limited  Result Date: 07/19/2016 CLINICAL DATA:  Abdominal distention.  Evaluate for ascites EXAM: LIMITED ABDOMEN ULTRASOUND FOR ASCITES TECHNIQUE: Limited ultrasound survey for ascites was performed in all four abdominal quadrants. COMPARISON:  None. FINDINGS: There is no evidence of ascites. Bilateral pleural effusions are noted. IMPRESSION: No evidence of ascites. Bilateral pleural effusions. Electronically Signed   By: Marybelle Killings M.D.   On: 07/19/2016 15:49    Echo 07/19/2016 - Left ventricle: The cavity size was normal. Wall thickness was   normal. Systolic function was normal. The estimated ejection   fraction was in the range of 50% to 55%. Wall motion was normal;   there were no regional wall motion abnormalities. -  Ventricular septum: Septal motion showed paradox. The contour   showed diastolic flattening and systolic flattening. These   changes are consistent with RV volume and pressure overload. - Mitral valve: There was mild regurgitation. - Left atrium: The atrium was mildly dilated. - Right ventricle: The cavity size was moderately dilated. Systolic   function was mildly to moderately reduced. - Right atrium: The atrium was severely dilated. - Tricuspid valve: There was severe regurgitation directed   centrally. - Pulmonary arteries: Systolic pressure was moderately to severely   increased. PA peak pressure: 73 mm Hg (S).  Subjective: Patient feels better, says "send me home." Ambulated the length of the unit yesterday. No chest pain or palpitations.   Discharge Exam: Vitals:   07/23/16 0527 07/23/16 1207  BP: (!) 157/89 (!) 177/88  Pulse: 68 67  Resp: 18 18  Temp: 97.9 F (36.6 C) 97.7 F (36.5 C)   Vitals:   07/22/16 2059 07/22/16 2100 07/23/16 0527 07/23/16 1207  BP: (!) 161/84  (!) 157/89 (!) 177/88  Pulse: 66  68 67  Resp: 18  18 18   Temp: 97.8 F (36.6 C)  97.9 F (36.6 C) 97.7 F (36.5 C)  TempSrc: Oral  Oral Oral  SpO2: 90% 92% 95% 91%  Weight:  68.4 kg (150 lb 14.4 oz)   Height:       General: Pt is alert, awake, not in acute distress Resp: Nonlabored breathing on 2LPM. Decreased air movement without significant wheezes.  CV: Regular rate and rhythm, II/VI LSB systolic murmur. No rub, or gallop. JVP 9-10, and trace bilateral pedal edema. Abdominal: Soft, NT, ND, bowel sounds + Extremities: no edema, no cyanosis  The results of significant diagnostics from this hospitalization (including imaging, microbiology, ancillary and laboratory) are listed below for reference.    Microbiology: No results found for this or any previous visit (from the past 240 hour(s)).   Labs: BNP (last 3 results)  Recent Labs  07/17/16 1302  BNP 883.2*   Basic Metabolic  Panel:  Recent Labs Lab 07/17/16 2113  07/20/16 0234 07/20/16 1457 07/21/16 0429 07/22/16 0309 07/23/16 0928  NA  --   < > 133* 136 133* 139 138  K  --   < > 5.6* 5.1 4.6 4.3 4.7  CL  --   < > 103 102 104 105 105  CO2  --   < > 22 23 23 24  21*  GLUCOSE  --   < > 140* 165* 119* 176* 142*  BUN  --   < > 75* 80* 87* 88* 88*  CREATININE  --   < > 4.03* 4.14* 3.90* 3.81* 3.78*  CALCIUM  --   < > 8.8* 9.0 8.4* 8.4* 8.7*  MG 2.2  --   --   --   --   --   --   < > = values in this interval not displayed. Liver Function Tests: No results for input(s): AST, ALT, ALKPHOS, BILITOT, PROT, ALBUMIN in the last 168 hours. No results for input(s): LIPASE, AMYLASE in the last 168 hours. No results for input(s): AMMONIA in the last 168 hours. CBC:  Recent Labs Lab 07/17/16 1302 07/22/16 0309 07/23/16 0928  WBC 6.1 5.2 7.7  HGB 11.8* 10.5* 11.2*  HCT 37.2* 32.5* 34.0*  MCV 93.2 89.8 90.4  PLT 128* 135* 137*   Cardiac Enzymes: No results for input(s): CKTOTAL, CKMB, CKMBINDEX, TROPONINI in the last 168 hours. BNP: Invalid input(s): POCBNP CBG: No results for input(s): GLUCAP in the last 168 hours. D-Dimer No results for input(s): DDIMER in the last 72 hours. Hgb A1c No results for input(s): HGBA1C in the last 72 hours. Lipid Profile No results for input(s): CHOL, HDL, LDLCALC, TRIG, CHOLHDL, LDLDIRECT in the last 72 hours. Thyroid function studies No results for input(s): TSH, T4TOTAL, T3FREE, THYROIDAB in the last 72 hours.  Invalid input(s): FREET3 Anemia work up No results for input(s): VITAMINB12, FOLATE, FERRITIN, TIBC, IRON, RETICCTPCT in the last 72 hours. Urinalysis    Component Value Date/Time   COLORURINE YELLOW 10/07/2014 1932   APPEARANCEUR CLOUDY (A) 10/07/2014 1932   LABSPEC 1.010 10/07/2014 1932   PHURINE 6.0 10/07/2014 1932   GLUCOSEU NEGATIVE 10/07/2014 1932   HGBUR NEGATIVE 10/07/2014 1932   BILIRUBINUR NEGATIVE 10/07/2014 1932   KETONESUR NEGATIVE  10/07/2014 1932   PROTEINUR NEGATIVE 10/07/2014 1932   UROBILINOGEN 0.2 10/07/2014 1932   NITRITE NEGATIVE 10/07/2014 1932   LEUKOCYTESUR NEGATIVE 10/07/2014 1932   Sepsis Labs Invalid input(s): PROCALCITONIN,  WBC,  LACTICIDVEN Microbiology No results found for this or any previous visit (from the past 240 hour(s)).  Time coordinating discharge: Over 30 minutes  Vance Gather, MD  [facility] 07/23/2016, 1:04 PM Pager [phone]  If 7PM-7AM, please contact night-coverage [email or web address] Password [email or web address]

## 2016-07-25 ENCOUNTER — Encounter (HOSPITAL_COMMUNITY): Payer: Self-pay | Admitting: Emergency Medicine

## 2016-07-25 ENCOUNTER — Encounter (HOSPITAL_COMMUNITY)
Admission: EM | Disposition: A | Discharge status: Hospice | Payer: Self-pay | Source: Home / Self Care | Attending: Internal Medicine

## 2016-07-25 ENCOUNTER — Inpatient Hospital Stay (HOSPITAL_COMMUNITY)
Admission: EM | Admit: 2016-07-25 | Discharge: 2016-07-27 | DRG: 308 | Disposition: A | Discharge status: Hospice | Payer: PPO | Attending: Internal Medicine | Admitting: Internal Medicine

## 2016-07-25 ENCOUNTER — Emergency Department (HOSPITAL_COMMUNITY): Payer: PPO

## 2016-07-25 DIAGNOSIS — R001 Bradycardia, unspecified: Secondary | ICD-10-CM | POA: Diagnosis present

## 2016-07-25 DIAGNOSIS — Z79899 Other long term (current) drug therapy: Secondary | ICD-10-CM

## 2016-07-25 DIAGNOSIS — I5032 Chronic diastolic (congestive) heart failure: Secondary | ICD-10-CM | POA: Diagnosis present

## 2016-07-25 DIAGNOSIS — Z87891 Personal history of nicotine dependence: Secondary | ICD-10-CM | POA: Diagnosis not present

## 2016-07-25 DIAGNOSIS — N170 Acute kidney failure with tubular necrosis: Secondary | ICD-10-CM | POA: Diagnosis not present

## 2016-07-25 DIAGNOSIS — Z7901 Long term (current) use of anticoagulants: Secondary | ICD-10-CM

## 2016-07-25 DIAGNOSIS — E872 Acidosis: Secondary | ICD-10-CM | POA: Diagnosis present

## 2016-07-25 DIAGNOSIS — I959 Hypotension, unspecified: Secondary | ICD-10-CM

## 2016-07-25 DIAGNOSIS — E441 Mild protein-calorie malnutrition: Secondary | ICD-10-CM | POA: Diagnosis not present

## 2016-07-25 DIAGNOSIS — E785 Hyperlipidemia, unspecified: Secondary | ICD-10-CM | POA: Diagnosis present

## 2016-07-25 DIAGNOSIS — I472 Ventricular tachycardia: Secondary | ICD-10-CM | POA: Diagnosis not present

## 2016-07-25 DIAGNOSIS — J449 Chronic obstructive pulmonary disease, unspecified: Secondary | ICD-10-CM | POA: Diagnosis not present

## 2016-07-25 DIAGNOSIS — G9341 Metabolic encephalopathy: Secondary | ICD-10-CM | POA: Diagnosis present

## 2016-07-25 DIAGNOSIS — I130 Hypertensive heart and chronic kidney disease with heart failure and stage 1 through stage 4 chronic kidney disease, or unspecified chronic kidney disease: Secondary | ICD-10-CM | POA: Diagnosis present

## 2016-07-25 DIAGNOSIS — J9602 Acute respiratory failure with hypercapnia: Secondary | ICD-10-CM | POA: Diagnosis present

## 2016-07-25 DIAGNOSIS — E46 Unspecified protein-calorie malnutrition: Secondary | ICD-10-CM | POA: Diagnosis present

## 2016-07-25 DIAGNOSIS — E1122 Type 2 diabetes mellitus with diabetic chronic kidney disease: Secondary | ICD-10-CM | POA: Diagnosis present

## 2016-07-25 DIAGNOSIS — I480 Paroxysmal atrial fibrillation: Secondary | ICD-10-CM | POA: Diagnosis not present

## 2016-07-25 DIAGNOSIS — N185 Chronic kidney disease, stage 5: Secondary | ICD-10-CM

## 2016-07-25 DIAGNOSIS — I10 Essential (primary) hypertension: Secondary | ICD-10-CM | POA: Diagnosis present

## 2016-07-25 DIAGNOSIS — I2721 Secondary pulmonary arterial hypertension: Secondary | ICD-10-CM | POA: Diagnosis present

## 2016-07-25 DIAGNOSIS — K219 Gastro-esophageal reflux disease without esophagitis: Secondary | ICD-10-CM | POA: Diagnosis present

## 2016-07-25 DIAGNOSIS — Z515 Encounter for palliative care: Secondary | ICD-10-CM | POA: Diagnosis not present

## 2016-07-25 DIAGNOSIS — I70219 Atherosclerosis of native arteries of extremities with intermittent claudication, unspecified extremity: Secondary | ICD-10-CM | POA: Diagnosis present

## 2016-07-25 DIAGNOSIS — Z720 Tobacco use: Secondary | ICD-10-CM | POA: Diagnosis present

## 2016-07-25 DIAGNOSIS — R570 Cardiogenic shock: Secondary | ICD-10-CM | POA: Diagnosis present

## 2016-07-25 DIAGNOSIS — N179 Acute kidney failure, unspecified: Secondary | ICD-10-CM | POA: Diagnosis not present

## 2016-07-25 DIAGNOSIS — N186 End stage renal disease: Secondary | ICD-10-CM | POA: Diagnosis not present

## 2016-07-25 DIAGNOSIS — J810 Acute pulmonary edema: Secondary | ICD-10-CM | POA: Diagnosis not present

## 2016-07-25 DIAGNOSIS — I442 Atrioventricular block, complete: Principal | ICD-10-CM | POA: Diagnosis present

## 2016-07-25 DIAGNOSIS — D631 Anemia in chronic kidney disease: Secondary | ICD-10-CM | POA: Diagnosis present

## 2016-07-25 DIAGNOSIS — Z66 Do not resuscitate: Secondary | ICD-10-CM | POA: Diagnosis present

## 2016-07-25 DIAGNOSIS — I5084 End stage heart failure: Secondary | ICD-10-CM | POA: Diagnosis present

## 2016-07-25 DIAGNOSIS — R55 Syncope and collapse: Secondary | ICD-10-CM

## 2016-07-25 DIAGNOSIS — E875 Hyperkalemia: Secondary | ICD-10-CM | POA: Diagnosis present

## 2016-07-25 DIAGNOSIS — Z7951 Long term (current) use of inhaled steroids: Secondary | ICD-10-CM

## 2016-07-25 DIAGNOSIS — I714 Abdominal aortic aneurysm, without rupture, unspecified: Secondary | ICD-10-CM | POA: Diagnosis present

## 2016-07-25 DIAGNOSIS — I4892 Unspecified atrial flutter: Secondary | ICD-10-CM | POA: Diagnosis not present

## 2016-07-25 DIAGNOSIS — N184 Chronic kidney disease, stage 4 (severe): Secondary | ICD-10-CM | POA: Diagnosis present

## 2016-07-25 HISTORY — PX: CARDIAC CATHETERIZATION: SHX172

## 2016-07-25 LAB — GLUCOSE, CAPILLARY
GLUCOSE-CAPILLARY: 117 mg/dL — AB (ref 65–99)
GLUCOSE-CAPILLARY: 121 mg/dL — AB (ref 65–99)
GLUCOSE-CAPILLARY: 133 mg/dL — AB (ref 65–99)
Glucose-Capillary: 119 mg/dL — ABNORMAL HIGH (ref 65–99)
Glucose-Capillary: 126 mg/dL — ABNORMAL HIGH (ref 65–99)
Glucose-Capillary: 132 mg/dL — ABNORMAL HIGH (ref 65–99)

## 2016-07-25 LAB — BASIC METABOLIC PANEL
ANION GAP: 10 (ref 5–15)
ANION GAP: 11 (ref 5–15)
Anion gap: 7 (ref 5–15)
BUN: 102 mg/dL — AB (ref 6–20)
BUN: 100 mg/dL — ABNORMAL HIGH (ref 6–20)
BUN: 112 mg/dL — ABNORMAL HIGH (ref 6–20)
CALCIUM: 8.7 mg/dL — AB (ref 8.9–10.3)
CHLORIDE: 106 mmol/L (ref 101–111)
CO2: 22 mmol/L (ref 22–32)
CO2: 21 mmol/L — AB (ref 22–32)
CO2: 26 mmol/L (ref 22–32)
CREATININE: 4.35 mg/dL — AB (ref 0.61–1.24)
Calcium: 10.3 mg/dL (ref 8.9–10.3)
Calcium: 8.8 mg/dL — ABNORMAL LOW (ref 8.9–10.3)
Chloride: 106 mmol/L (ref 101–111)
Chloride: 101 mmol/L (ref 101–111)
Creatinine, Ser: 4.44 mg/dL — ABNORMAL HIGH (ref 0.61–1.24)
Creatinine, Ser: 5.07 mg/dL — ABNORMAL HIGH (ref 0.61–1.24)
GFR, EST AFRICAN AMERICAN: 14 mL/min — AB (ref >60)
GFR, EST AFRICAN AMERICAN: 14 mL/min — AB (ref >60)
GFR, EST AFRICAN AMERICAN: 12 mL/min — AB (ref >60)
GFR, EST NON AFRICAN AMERICAN: 12 mL/min — AB (ref >60)
GFR, EST NON AFRICAN AMERICAN: 12 mL/min — AB (ref >60)
GFR, EST NON AFRICAN AMERICAN: 10 mL/min — AB (ref >60)
GLUCOSE: 121 mg/dL — AB (ref 65–99)
Glucose, Bld: 215 mg/dL — ABNORMAL HIGH (ref 65–99)
Glucose, Bld: 141 mg/dL — ABNORMAL HIGH (ref 65–99)
POTASSIUM: 5.9 mmol/L — AB (ref 3.5–5.1)
POTASSIUM: 5.9 mmol/L — AB (ref 3.5–5.1)
Potassium: 5.4 mmol/L — ABNORMAL HIGH (ref 3.5–5.1)
SODIUM: 135 mmol/L (ref 135–145)
Sodium: 137 mmol/L (ref 135–145)
Sodium: 138 mmol/L (ref 135–145)

## 2016-07-25 LAB — I-STAT ARTERIAL BLOOD GAS, ED
Acid-base deficit: 10 mmol/L — ABNORMAL HIGH (ref 0.0–2.0)
Bicarbonate: 20 mmol/L (ref 20.0–28.0)
O2 Saturation: 63 %
PH ART: 7.107 — AB (ref 7.350–7.450)
TCO2: 22 mmol/L (ref 0–100)
pCO2 arterial: 63.3 mmHg — ABNORMAL HIGH (ref 32.0–48.0)
pO2, Arterial: 45 mmHg — ABNORMAL LOW (ref 83.0–108.0)

## 2016-07-25 LAB — CBC WITH DIFFERENTIAL/PLATELET
BASOS ABS: 0 10*3/uL (ref 0.0–0.1)
Basophils Relative: 0 %
EOS ABS: 0 10*3/uL (ref 0.0–0.7)
EOS PCT: 1 %
HCT: 30.9 % — ABNORMAL LOW (ref 39.0–52.0)
HEMOGLOBIN: 9.8 g/dL — AB (ref 13.0–17.0)
Lymphocytes Relative: 29 %
Lymphs Abs: 2.2 10*3/uL (ref 0.7–4.0)
MCH: 29.3 pg (ref 26.0–34.0)
MCHC: 31.7 g/dL (ref 30.0–36.0)
MCV: 92.2 fL (ref 78.0–100.0)
Monocytes Absolute: 0.4 10*3/uL (ref 0.1–1.0)
Monocytes Relative: 6 %
NEUTROS PCT: 65 %
Neutro Abs: 5.1 10*3/uL (ref 1.7–7.7)
PLATELETS: 105 10*3/uL — AB (ref 150–400)
RBC: 3.35 MIL/uL — AB (ref 4.22–5.81)
RDW: 15.8 % — ABNORMAL HIGH (ref 11.5–15.5)
WBC: 7.8 10*3/uL (ref 4.0–10.5)

## 2016-07-25 LAB — I-STAT CHEM 8, ED
BUN: 109 mg/dL — AB (ref 6–20)
CHLORIDE: 106 mmol/L (ref 101–111)
Calcium, Ion: 1.36 mmol/L (ref 1.15–1.40)
Creatinine, Ser: 4.1 mg/dL — ABNORMAL HIGH (ref 0.61–1.24)
Glucose, Bld: 202 mg/dL — ABNORMAL HIGH (ref 65–99)
HEMATOCRIT: 32 % — AB (ref 39.0–52.0)
Hemoglobin: 10.9 g/dL — ABNORMAL LOW (ref 13.0–17.0)
POTASSIUM: 5.9 mmol/L — AB (ref 3.5–5.1)
SODIUM: 136 mmol/L (ref 135–145)
TCO2: 23 mmol/L (ref 0–100)

## 2016-07-25 LAB — CBG MONITORING, ED: Glucose-Capillary: 198 mg/dL — ABNORMAL HIGH (ref 65–99)

## 2016-07-25 LAB — I-STAT TROPONIN, ED: TROPONIN I, POC: 0.01 ng/mL (ref 0.00–0.08)

## 2016-07-25 LAB — CBC
HCT: 33.8 % — ABNORMAL LOW (ref 39.0–52.0)
HEMOGLOBIN: 10.7 g/dL — AB (ref 13.0–17.0)
MCH: 29.3 pg (ref 26.0–34.0)
MCHC: 31.7 g/dL (ref 30.0–36.0)
MCV: 92.6 fL (ref 78.0–100.0)
Platelets: 159 10*3/uL (ref 150–400)
RBC: 3.65 MIL/uL — AB (ref 4.22–5.81)
RDW: 15.8 % — ABNORMAL HIGH (ref 11.5–15.5)
WBC: 12 10*3/uL — AB (ref 4.0–10.5)

## 2016-07-25 LAB — MRSA PCR SCREENING: MRSA BY PCR: NEGATIVE

## 2016-07-25 SURGERY — TEMPORARY PACEMAKER
Anesthesia: Moderate Sedation

## 2016-07-25 SURGERY — TEMPORARY PACEMAKER

## 2016-07-25 MED ORDER — IPRATROPIUM-ALBUTEROL 0.5-2.5 (3) MG/3ML IN SOLN
3.0000 mL | Freq: Four times a day (QID) | RESPIRATORY_TRACT | Status: DC
  Administered 2016-07-25 – 2016-07-27 (×8): 3 mL via RESPIRATORY_TRACT
  Filled 2016-07-25 (×12): qty 3

## 2016-07-25 MED ORDER — HEPARIN (PORCINE) IN NACL 2-0.9 UNIT/ML-% IJ SOLN
INTRAMUSCULAR | Status: AC
  Filled 2016-07-25: qty 500

## 2016-07-25 MED ORDER — SODIUM CHLORIDE 0.9% FLUSH: 3.0000 mL | Freq: Two times a day (BID) | INTRAVENOUS | Status: DC

## 2016-07-25 MED ORDER — BUDESONIDE 0.5 MG/2ML IN SUSP
0.5000 mg | Freq: Two times a day (BID) | RESPIRATORY_TRACT | Status: DC
  Administered 2016-07-25 – 2016-07-27 (×5): 0.5 mg via RESPIRATORY_TRACT
  Filled 2016-07-25 (×5): qty 2

## 2016-07-25 MED ORDER — SODIUM CHLORIDE 0.9 % IV SOLN: 250.0000 mL | INTRAVENOUS | Status: DC | PRN

## 2016-07-25 MED ORDER — STERILE WATER FOR INJECTION IV SOLN
INTRAVENOUS | Status: DC
  Administered 2016-07-25 (×2): via INTRAVENOUS
  Filled 2016-07-25 (×4): qty 850

## 2016-07-25 MED ORDER — INSULIN ASPART 100 UNIT/ML ~~LOC~~ SOLN
10.0000 [IU] | Freq: Once | SUBCUTANEOUS | Status: AC
  Administered 2016-07-25: 10 [IU] via INTRAVENOUS
  Filled 2016-07-25: qty 1

## 2016-07-25 MED ORDER — HEPARIN (PORCINE) IN NACL 2-0.9 UNIT/ML-% IJ SOLN
INTRAMUSCULAR | Status: DC | PRN
Start: 2016-07-25 — End: 2016-07-25
  Administered 2016-07-25: 500 mL

## 2016-07-25 MED ORDER — ALBUTEROL SULFATE (2.5 MG/3ML) 0.083% IN NEBU
2.5000 mg | INHALATION_SOLUTION | RESPIRATORY_TRACT | Status: DC | PRN
  Filled 2016-07-25: qty 3

## 2016-07-25 MED ORDER — ORAL CARE MOUTH RINSE
15.0000 mL | Freq: Two times a day (BID) | OROMUCOSAL | Status: DC
  Administered 2016-07-25: 15 mL via OROMUCOSAL

## 2016-07-25 MED ORDER — INSULIN ASPART 100 UNIT/ML ~~LOC~~ SOLN
2.0000 [IU] | SUBCUTANEOUS | Status: DC
  Administered 2016-07-25: 2 [IU] via SUBCUTANEOUS
  Administered 2016-07-25: 4 [IU] via SUBCUTANEOUS
  Administered 2016-07-25: 2 [IU] via SUBCUTANEOUS
  Administered 2016-07-26: 4 [IU] via SUBCUTANEOUS
  Administered 2016-07-26 (×2): 2 [IU] via SUBCUTANEOUS

## 2016-07-25 MED ORDER — FENTANYL CITRATE (PF) 100 MCG/2ML IJ SOLN
INTRAMUSCULAR | Status: AC
  Filled 2016-07-25: qty 2

## 2016-07-25 MED ORDER — SODIUM POLYSTYRENE SULFONATE 15 GM/60ML PO SUSP
30.0000 g | Freq: Once | ORAL | Status: AC
  Administered 2016-07-25: 30 g via ORAL
  Filled 2016-07-25: qty 120

## 2016-07-25 MED ORDER — SODIUM CHLORIDE 0.9 % IV SOLN: INTRAVENOUS | Status: DC

## 2016-07-25 MED ORDER — FUROSEMIDE 10 MG/ML IJ SOLN
80.0000 mg | Freq: Once | INTRAMUSCULAR | Status: AC
  Administered 2016-07-25: 80 mg via INTRAVENOUS
  Filled 2016-07-25: qty 8

## 2016-07-25 MED ORDER — CALCIUM CHLORIDE 10 % IV SOLN
1.0000 g | Freq: Once | INTRAVENOUS | Status: AC
  Administered 2016-07-25: 1 g via INTRAVENOUS

## 2016-07-25 MED ORDER — LIDOCAINE HCL (PF) 1 % IJ SOLN
INTRAMUSCULAR | Status: DC | PRN
  Administered 2016-07-25: 15 mL via INTRADERMAL

## 2016-07-25 MED ORDER — SODIUM CHLORIDE 0.9% FLUSH
3.0000 mL | Freq: Two times a day (BID) | INTRAVENOUS | Status: DC
  Administered 2016-07-25 – 2016-07-27 (×5): 3 mL via INTRAVENOUS

## 2016-07-25 MED ORDER — LIDOCAINE HCL (PF) 1 % IJ SOLN
INTRAMUSCULAR | Status: AC
  Filled 2016-07-25: qty 30

## 2016-07-25 MED ORDER — EPINEPHRINE PF 1 MG/ML IJ SOLN
0.5000 ug/min | INTRAVENOUS | Status: DC
  Administered 2016-07-25: 0.5 ug/min via INTRAVENOUS
  Filled 2016-07-25: qty 4

## 2016-07-25 MED ORDER — SODIUM CHLORIDE 0.9 % IV SOLN
1.0000 g | Freq: Once | INTRAVENOUS | Status: AC
  Administered 2016-07-25: 1 g via INTRAVENOUS
  Filled 2016-07-25: qty 10

## 2016-07-25 MED ORDER — CALCIUM GLUCONATE 10 % IV SOLN
1.0000 g | Freq: Once | INTRAVENOUS | Status: DC
  Filled 2016-07-25: qty 10

## 2016-07-25 MED ORDER — SODIUM CHLORIDE 0.9 % IV SOLN
INTRAVENOUS | Status: AC
  Administered 2016-07-25: 05:00:00 via INTRAVENOUS

## 2016-07-25 MED ORDER — ONDANSETRON HCL 4 MG/2ML IJ SOLN
4.0000 mg | Freq: Four times a day (QID) | INTRAMUSCULAR | Status: DC | PRN
  Administered 2016-07-25 – 2016-07-27 (×4): 4 mg via INTRAVENOUS
  Filled 2016-07-25 (×7): qty 2

## 2016-07-25 MED ORDER — GUAIFENESIN 100 MG/5ML PO SOLN
5.0000 mL | ORAL | Status: DC | PRN
  Administered 2016-07-25 – 2016-07-27 (×3): 100 mg via ORAL
  Filled 2016-07-25 (×3): qty 5

## 2016-07-25 MED ORDER — CHLORHEXIDINE GLUCONATE 0.12 % MT SOLN
15.0000 mL | Freq: Two times a day (BID) | OROMUCOSAL | Status: DC
  Administered 2016-07-25: 15 mL via OROMUCOSAL
  Filled 2016-07-25: qty 15

## 2016-07-25 MED ORDER — SODIUM CHLORIDE 0.9% FLUSH: 3.0000 mL | INTRAVENOUS | Status: DC | PRN

## 2016-07-25 MED ORDER — SODIUM CHLORIDE 0.9 % IV BOLUS (SEPSIS)
1000.0000 mL | Freq: Once | INTRAVENOUS | Status: AC
  Administered 2016-07-25: 1000 mL via INTRAVENOUS

## 2016-07-25 MED ORDER — DOPAMINE-DEXTROSE 3.2-5 MG/ML-% IV SOLN
0.0000 ug/kg/min | INTRAVENOUS | Status: DC
  Administered 2016-07-25: 20 ug/kg/min via INTRAVENOUS
  Filled 2016-07-25: qty 250

## 2016-07-25 MED ORDER — GLUCAGON HCL RDNA (DIAGNOSTIC) 1 MG IJ SOLR
3.0000 mg/h | INTRAVENOUS | Status: DC
  Administered 2016-07-25: 3 mg/h via INTRAVENOUS
  Filled 2016-07-25: qty 5

## 2016-07-25 MED ORDER — DEXTROSE 50 % IV SOLN
50.0000 mL | Freq: Once | INTRAVENOUS | Status: AC
  Administered 2016-07-25: 50 mL via INTRAVENOUS

## 2016-07-25 MED ORDER — FENTANYL CITRATE (PF) 100 MCG/2ML IJ SOLN
50.0000 ug | Freq: Once | INTRAMUSCULAR | Status: AC
  Administered 2016-07-25: 50 ug via INTRAVENOUS

## 2016-07-25 MED FILL — Medication: Qty: 1 | Status: AC

## 2016-07-25 SURGICAL SUPPLY — 6 items
CABLE ADAPT CONN TEMP 6FT (ADAPTER) ×9 IMPLANT
CATH S G BIP PACING (SET/KITS/TRAYS/PACK) ×3 IMPLANT
PROTECTION STATION PRESSURIZED (MISCELLANEOUS) ×3
SHEATH PINNACLE 6F 10CM (SHEATH) ×3 IMPLANT
SLEEVE REPOSITIONING LENGTH 30 (MISCELLANEOUS) ×3 IMPLANT
STATION PROTECTION PRESSURIZED (MISCELLANEOUS) ×1 IMPLANT

## 2016-07-25 NOTE — ED Notes (Signed)
[facility] notified to page cath lab team for emergency temporary transvenous pacemaker insertion.

## 2016-07-25 NOTE — ED Notes (Signed)
Dopamine titrated up to 10 mcg/kg/min

## 2016-07-25 NOTE — Progress Notes (Signed)
Advanced Heart Failure Team Progress Note   HPI:    Mark [name] is a 70 y.o. male chronic diastolic heart failure, severe COPD on home O2, atrial fibrillation/flutter (previously on amiodarone but d/c'd 2/2 lung issues), CKD stage IV, AAA s/p repair, and pulmonary hypertension. He was first diagnosed with atrial fibrillation in 2014 and was placed on amiodarone at that time for rhythm control. He did well post cardioversion maintaining SR until a month or so ago when he was found to be in atrial flutter after discontinuing amiodarone 2/2 concerns from pulmonary standpoint.   He was hospitalized on the [name] over the past 2 weeks for recurrent dyspnea and A/CKD in setting of end-stage RHF and recurrent AF/AFL. CR > 4. Followed by Renal and diuresed gently. During that admission underwent palliative DC-CV and placed on flecainide as he was not candidate for another AA. Discharged 07/23/16. At d/c toprol stopped and kept on diltiazem 36 daily.   Readmitted early this morning after a syncopal event found with profound bradycardia/CHB and hypotension requiring emergent transvenous pacing by [name] and dopamine gtt. ABG wth pH 7.1 K 5.9. Given calcium and glucagon as well.   I turned down pacer  And he is now back in NSR 50-60s. He is alert and conversant currently but remains anuric. Cr 3.8-> 4.4. Bladder scan with 280cc   Home Medications Prior to Admission medications   Medication Sig Start Date End Date Taking? Authorizing Provider  albuterol (PROVENTIL HFA;VENTOLIN HFA) 108 (90 Base) MCG/ACT inhaler Inhale 1-2 puffs into the lungs every 4 (four) hours as needed for wheezing or shortness of breath.   Yes Historical Provider, MD  atorvastatin (LIPITOR) 10 MG tablet Take 10 mg by mouth at bedtime.    Yes Historical Provider, MD  budesonide-formoterol (SYMBICORT) 80-4.5 MCG/ACT inhaler Inhale 2 puffs into the lungs 2 (two) times daily. 06/05/16  Yes Nyoka CowdenMichael B Wert, MD    dextromethorphan-guaiFENesin [facility](MUCINEX DM) 30-600 MG 12hr tablet Take 1 tablet by mouth 2 (two) times daily as needed for cough.    Yes Historical Provider, MD  diltiazem (TIAZAC) 180 MG 24 hr capsule TAKE 2 CAPSULES BY MOUTH EVERY DAY 07/18/16  Yes Donita BrooksWarren T Pickard, MD  doxazosin (CARDURA) 4 MG tablet Take 1 tablet (4 mg total) by mouth daily. 05/30/16  Yes Donita BrooksWarren T Pickard, MD  flecainide (TAMBOCOR) 100 MG tablet Take 1 tablet (100 mg total) by mouth every 12 (twelve) hours. 07/23/16  Yes Tyrone Nineyan B Grunz, MD  furosemide (LASIX) 40 MG tablet Take 1 tablet (40 mg total) by mouth 2 (two) times daily. 07/23/16  Yes Tyrone Nineyan B Grunz, MD  metoprolol succinate (TOPROL-XL) 100 MG 24 hr tablet Take 1 tablet (100 mg total) by mouth daily. Take with or immediately following a meal. 04/06/16  Yes Donita BrooksWarren T Pickard, MD  ranitidine (ZANTAC) 150 MG capsule Take 150 mg by mouth daily as needed for heartburn.   Yes Historical Provider, MD  Rivaroxaban (XARELTO) 15 MG TABS tablet Take 1 tablet (15 mg total) by mouth daily with supper. 01/10/16  Yes Donita BrooksWarren T Pickard, MD  simethicone (MYLICON) 80 MG chewable tablet Chew 80 mg by mouth every 6 (six) hours as needed for flatulence.   Yes Historical Provider, MD  OXYGEN Use 2 L at bedtime and as needed for shortness of breath and activity    Historical Provider, MD  VENTOLIN HFA 108 (90 Base) MCG/ACT inhaler INHALE 2 PUFFS INTO THE LUNGS EVERY 6 (SIX) HOURS  AS NEEDED FOR WHEEZING OR SHORTNESS OF BREATH. Patient not taking: Reported on 07/25/2016 11/29/15   Donita Brooks, MD    Past Medical History: Past Medical History:  Diagnosis Date  . AAA (abdominal aortic aneurysm) (HCC)    a. 09/2013: Resection and grafting of abdominal aortic aneurysm with insertion of an aorto by common iliac graft using 18 x 9 mm Hemashield Dacron graft.  . Arthritis    "little in my hands" (09/08/2013)  . Chronic diastolic CHF (congestive heart failure) (HCC)    a.  Echo (08/13/2013): EF 55-60%,  normal wall motion, mild MR, mild LAE  . Chronic kidney disease (CKD), stage III (moderate)   . COPD (chronic obstructive pulmonary disease) (HCC)   . 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 GERD (gastroesophageal reflux disease)   . H/O hiatal hernia   . Hx of cardiovascular stress test    a. Myoview 09/2012:  no scar or ischemia  . Hyperlipidemia   . Hypertension   . On home oxygen therapy    "2L @ night" (07/17/2016)  . PAF (paroxysmal atrial fibrillation) (HCC)    a. amiodarone Rx started 08/2013;  b. s/p TEE-DCCV 09/2013 => recurrent AFib => repeat DCCV => NSR;   c. Xarelto 15 QD due to CKD  . Pulmonary hypertension associated with unclear multi-factorial mechanisms    Only on echo (8/17).      Past Surgical History: Past Surgical History:  Procedure Laterality Date  . ABDOMINAL AORTIC ANEURYSM REPAIR  10/15/2012   Procedure: ANEURYSM ABDOMINAL AORTIC REPAIR;  Surgeon: Pryor Ochoa, MD;  Location: [facility];  Service: Vascular;  Laterality: N/A;  Resection and Grafting of Abdominal Aortic Aneurysm - Aortobi-Iliac   . ABDOMINAL AORTIC ANEURYSM REPAIR  10-15-2012  . CARDIAC CATHETERIZATION N/A 07/25/2016   Procedure: Temporary Pacemaker;  Surgeon: Orpah Cobb, MD;  Location: [facility] INVASIVE CV LAB;  Service: Cardiovascular;  Laterality: N/A;  . CARDIAC CATHETERIZATION N/A 07/25/2016   Procedure: Temporary Pacemaker;  Surgeon: Orpah Cobb, MD;  Location: [facility] INVASIVE CV LAB;  Service: Cardiovascular;  Laterality: N/A;  . CARDIOVERSION N/A 09/22/2013   Procedure: CARDIOVERSION;  Surgeon: Lars Masson, MD;  Location: [facility] ENDOSCOPY;  Service: Cardiovascular;  Laterality: N/A;  . CARDIOVERSION  Jan. 12, 2015   Dr. Doylene Canning. Ladona Ridgel  . CARDIOVERSION N/A 09/28/2013   Procedure: CARDIOVERSION;  Surgeon: Marinus Maw, MD;  Location: [facility] CATH LAB;  Service: Cardiovascular;  Laterality: N/A;  . CARDIOVERSION N/A 06/04/2016   Procedure: CARDIOVERSION;  Surgeon: Lars Masson, MD;   Location: [facility] ENDOSCOPY;  Service: Cardiovascular;  Laterality: N/A;  . CARDIOVERSION N/A 07/21/2016   Procedure: CARDIOVERSION;  Surgeon: Dolores Patty, MD;  Location: [facility] OR;  Service: Cardiovascular;  Laterality: N/A;  . INGUINAL HERNIA REPAIR Right 12/22/2014   Procedure: OPEN RIGHT INGUINAL HERNIA REPAIR WITH MESH;  Surgeon: Axel Filler, MD;  Location: [facility] ORS;  Service: General;  Laterality: Right;  . INSERTION OF MESH N/A 12/22/2014   Procedure: INSERTION OF MESH;  Surgeon: Axel Filler, MD;  Location: [facility] ORS;  Service: General;  Laterality: N/A;  . KNEE ARTHROSCOPY Right   . TEE WITHOUT CARDIOVERSION N/A 09/22/2013   Procedure: TRANSESOPHAGEAL ECHOCARDIOGRAM (TEE);  Surgeon: Lars Masson, MD;  Location: [facility] ENDOSCOPY;  Service: Cardiovascular;  Laterality: N/A;    Family History: Family History  Problem Relation Age of Onset  . Diabetes Mother   . Tuberculosis Mother   . Heart disease  Father   . Heart attack Father   . Heart disease Son     Blood clot:  Arm    Social History: Social History   Social History  . Marital status: Married    Spouse name: Dorothy  . Number of children: 4  .Nicole Cella Years of education: N/A   Occupational History  . Works PT as a Education administratorpainter    Social History Main Topics  . Smoking status: Former Smoker    Packs/day: 1.00    Years: 56.00    Types: Cigarettes    Quit date: 07/31/2013  . Smokeless tobacco: Former NeurosurgeonUser    Types: Chew  . Alcohol use No  . Drug use: No  . Sexual activity: Not Currently   Other Topics Concern  . None   Social History Narrative   Married.  Lives with wife.  Ambulates without assistance.    Allergies:  Allergies  Allergen Reactions  . Niaspan [Niacin Er] Anaphylaxis  . Amiodarone     Stopped due to lung issues    Objective:    Vital Signs:   Temp:  [93 F (33.9 C)-99.2 F (37.3 C)] 98.3 F (36.8 C) (11/08 2038) Pulse Rate:  [0-275] 61 (11/08 2100) Resp:  [0-23] 15 (11/08 2100) BP:  (63-123)/(45-79) 120/67 (11/08 2100) SpO2:  [0 %-100 %] 98 % (11/08 2100) Weight:  [72.3 kg (159 lb 6.3 oz)-74.9 kg (165 lb 2 oz)] 74.9 kg (165 lb 2 oz) (11/08 1400) Last BM Date:  (uta)  Weight change: Filed Weights   07/25/16 0449 07/25/16 1400  Weight: 72.3 kg (159 lb 6.3 oz) 74.9 kg (165 lb 2 oz)    Intake/Output:   Intake/Output Summary (Last 24 hours) at 07/25/16 2133 Last data filed at 07/25/16 2100  Gross per 24 hour  Intake             3025 ml  Output                0 ml  Net             3025 ml     Physical Exam: General:  Elderly male lying in bed. No resp difficulty HEENT: normal x for poor dentition Neck: supple. JVP to ear . Carotids 2+ bilat; no bruits. No lymphadenopathy or thryomegaly appreciated. Cor: PMI nondisplaced. Regular rate & rhythm. 2/6 TR. Lungs: Diminished BS throughout no wheeze Abdomen: soft, nontender, + distended. No hepatosplenomegaly. No bruits or masses. Good bowel sounds. Extremities: no cyanosis, clubbing, rash, 2-3+ edema. Temporary wire R groin with leg immobilizer Neuro: alert & orientedx3, cranial nerves grossly intact. moves all 4 extremities w/o difficulty. Affect pleasant  Telemetry: NSR 50-60  Labs: Basic Metabolic Panel:  Recent Labs Lab 07/21/16 0429 07/22/16 0309 07/23/16 0928 07/25/16 0130 07/25/16 0144 07/25/16 0533  NA 133* 139 138 135 136 137  K 4.6 4.3 4.7 5.9* 5.9* 5.4*  CL 104 105 105 106 106 106  CO2 23 24 21* 22  --  21*  GLUCOSE 119* 176* 142* 215* 202* 141*  BUN 87* 88* 88* 102* 109* 100*  CREATININE 3.90* 3.81* 3.78* 4.35* 4.10* 4.44*  CALCIUM 8.4* 8.4* 8.7* 10.3  --  8.7*    Liver Function Tests: No results for input(s): AST, ALT, ALKPHOS, BILITOT, PROT, ALBUMIN in the last 168 hours. No results for input(s): LIPASE, AMYLASE in the last 168 hours. No results for input(s): AMMONIA in the last 168 hours.  CBC:  Recent Labs Lab 07/22/16 0309 07/23/16  [id number] 07/25/16 0130 07/25/16 0144  07/25/16 0533  WBC 5.2 7.7 7.8  --  12.0*  NEUTROABS  --   --  5.1  --   --   HGB 10.5* 11.2* 9.8* 10.9* 10.7*  HCT 32.5* 34.0* 30.9* 32.0* 33.8*  MCV 89.8 90.4 92.2  --  92.6  PLT 135* 137* 105*  --  159    Cardiac Enzymes: No results for input(s): CKTOTAL, CKMB, CKMBINDEX, TROPONINI in the last 168 hours.  BNP: BNP (last 3 results)  Recent Labs  07/17/16 1302  BNP 915.0*    ProBNP (last 3 results)  Recent Labs  05/08/16 1212  PROBNP 1,079.0*     CBG:  Recent Labs Lab 07/25/16 0542 07/25/16 0737 07/25/16 1154 07/25/16 1537 07/25/16 2042  GLUCAP 133* 117* 119* 126* 132*    Coagulation Studies: No results for input(s): LABPROT, INR in the last 72 hours.  Other results: EKG: CHB with junctional 20-30s  Imaging: Ct Head Wo Contrast  Result Date: 07/25/2016 CLINICAL DATA:  Weakness.  Status post fall. EXAM: CT HEAD WITHOUT CONTRAST TECHNIQUE: Contiguous axial images were obtained from the base of the skull through the vertex without intravenous contrast. COMPARISON:  Head CT 10/07/2014 FINDINGS: Brain: No mass lesion, intraparenchymal hemorrhage or extra-axial collection. No evidence of acute cortical infarct. Brain parenchyma and CSF-containing spaces are normal for age. Vascular: No hyperdense vessel or unexpected calcification. Skull: Normal visualized skull base, calvarium and extracranial soft tissues. Sinuses/Orbits: No sinus fluid levels or advanced mucosal thickening. No mastoid effusion. Normal orbits. IMPRESSION: Normal head CT for age. Electronically Signed   By: Deatra Robinson M.D.   On: 07/25/2016 02:29   Dg Chest Port 1 View  Result Date: 07/25/2016 CLINICAL DATA:  Weakness.  Fall tonight. EXAM: PORTABLE CHEST 1 VIEW COMPARISON:  07/18/2016 FINDINGS: Cardiac enlargement without vascular congestion. Small bilateral pleural effusions with basilar infiltration or atelectasis, greater on the right. No pneumothorax. Calcification of the aorta. Similar  appearance to previous study. IMPRESSION: Bilateral pleural effusions with basilar atelectasis or infiltration, greater on the right. Electronically Signed   By: Burman Nieves M.D.   On: 07/25/2016 02:01      Medications:     Current Medications: . budesonide (PULMICORT) nebulizer solution  0.5 mg Nebulization BID  . chlorhexidine  15 mL Mouth Rinse BID  . insulin aspart  2-6 Units Subcutaneous Q4H  . ipratropium-albuterol  3 mL Nebulization Q6H  . mouth rinse  15 mL Mouth Rinse q12n4p  . sodium chloride flush  3 mL Intravenous Q12H     Infusions: . DOPamine 0 mcg/kg/min (07/25/16 0700)  . epinephrine Stopped (07/25/16 0445)  . glucagon (GLUCAGEN) infusion (for beta blocker/calcium channel blocker overdose) 3 mg/hr (07/25/16 0511)  .  sodium bicarbonate 150 mEq in sterile water 1000 mL infusion 75 mL/hr at 07/25/16 1840      Assessment:   1. Syncope with CHB 2. Hypotension/shock due to #1 3. PAFL s/p recent DC-CV 4. PAH with end-stage RHF 5. 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 COPD 7. Hyperkalemia  Plan/Discussion:    He is critically ill with worsening multi-system organ failure in the setting of end-stage RHF now c/b by an episode of CHB, hypotension and ARF.   He is improved with TVP and it looks like his sinus node is recovering with holding of CCB and flecainide and correction of hyperkalemia. However I worry his renal functional will continue to deteriorate. He is not candidate for HD. I gave  him a dose o f lasix with little response.   Will continue to follow overnight and see if he makes any progress. If he recovers will likely restart flecainide as he does not feel well when he is out of rhythm.   May be headed toward a palliative route. (Was seen by Palliative Care last admission).    D/w Dr. Elberta Fortis  The patient is critically ill with multiple organ systems failure and requires high complexity decision making for assessment and support, frequent  evaluation and titration of therapies, application of advanced monitoring technologies and extensive interpretation of multiple databases.   Critical Care Time devoted to patient care services described in this note is 45 Minutes.  Length of Stay: 0  Arvilla Meres MD 07/25/2016, 9:33 PM  Advanced Heart Failure Team Pager [phone] (M-F; 7a - 4p)  Please contact [facility] for night-coverage after hours (4p -7a ) and weekends on [email or web address]

## 2016-07-25 NOTE — Consult Note (Signed)
PULMONARY / CRITICAL CARE MEDICINE   Name: Mark [name] MRN: [id number] DOB: Mar 17, 1946    ADMISSION DATE:  07/25/2016 CONSULTATION DATE:  07/25/2016  REFERRING MD:  Dr. Algie Coffer, Cardiology  CHIEF COMPLAINT:  Fall, bradycardia  HISTORY OF PRESENT ILLNESS:  Mark [name] a 70 y.o.malewith medical history significant for abdominal aortic aneurysm status post resection and grafting in 2015, atrial fibrillation on xarelto, CKD stage IV with anemia, dyslipidemia, hypertension, COPD with bronchiectasis and history of recurrent right pleural effusion. He was recently admitted to [facility] for hypoxemic respiratory failure secondary to primarilyacute on chronic diastolic CHF, AF RVR,  and also COPD. Diuresis limited due to renal function and he has been told he is a poor candidate for dialysis. He underwent DCCV 11/4 with return of NSR. He was seen by palliative care and qualifies for hospice, but declined. Was made DNR during that admission. He was discharged on home O2 as palliative measure. He was discharged also on home cardiac meds including beta blocker. 11/8 he fell at home after feeling lightheaded prompting presentation to ED. Upon arrival he was found to be bradycardic (20s) and hypotensive.  He was given atropine and glucagon with little improvement. He was hyperkalemic and was given Calcium, insulin, and bicarb for this. Eventually transcutaneous pacing was started. Cardiology and PCCM consulted.   PAST MEDICAL HISTORY :  He  has a past medical history of AAA (abdominal aortic aneurysm) (HCC); Arthritis; Chronic diastolic CHF (congestive heart failure) (HCC); Chronic kidney disease (CKD), stage III (moderate); COPD (chronic obstructive pulmonary disease) (HCC); Diabetes mellitus without complication (HCC); GERD (gastroesophageal reflux disease); H/O hiatal hernia; cardiovascular stress test; Hyperlipidemia; Hypertension; On home oxygen therapy; PAF (paroxysmal atrial fibrillation) (HCC);  and Pulmonary hypertension associated with unclear multi-factorial mechanisms.  PAST SURGICAL HISTORY: He  has a past surgical history that includes Knee arthroscopy (Right); Abdominal aortic aneurysm repair (10/15/2012); TEE without cardioversion (N/A, 09/22/2013); Cardioversion (N/A, 09/22/2013); Abdominal aortic aneurysm repair (10-15-2012); Cardioversion (Jan. 12, 2015); Cardioversion (N/A, 09/28/2013); Inguinal hernia repair (Right, 12/22/2014); Insertion of mesh (N/A, 12/22/2014); Cardioversion (N/A, 06/04/2016); and Cardioversion (N/A, 07/21/2016).  Allergies  Allergen Reactions  . Niaspan [Niacin Er] Anaphylaxis  . Amiodarone     Stopped due to lung issues    No current facility-administered medications on file prior to encounter.    Current Outpatient Prescriptions on File Prior to Encounter  Medication Sig  . atorvastatin (LIPITOR) 10 MG tablet Take 10 mg by mouth at bedtime.   . budesonide-formoterol (SYMBICORT) 80-4.5 MCG/ACT inhaler Inhale 2 puffs into the lungs 2 (two) times daily.  Marland Kitchen dextromethorphan-guaiFENesin (MUCINEX DM) 30-600 MG 12hr tablet Take 1 tablet by mouth 2 (two) times daily as needed.   . diltiazem (TIAZAC) 180 MG 24 hr capsule TAKE 2 CAPSULES BY MOUTH EVERY DAY  . doxazosin (CARDURA) 4 MG tablet Take 1 tablet (4 mg total) by mouth daily.  . flecainide (TAMBOCOR) 100 MG tablet Take 1 tablet (100 mg total) by mouth every 12 (twelve) hours.  . furosemide (LASIX) 40 MG tablet Take 1 tablet (40 mg total) by mouth 2 (two) times daily.  . metoprolol succinate (TOPROL-XL) 100 MG 24 hr tablet Take 1 tablet (100 mg total) by mouth daily. Take with or immediately following a meal.  . OXYGEN Use 2 L at bedtime and as needed for shortness of breath and activity  . ranitidine (ZANTAC) 150 MG capsule Take 150 mg by mouth daily as needed for heartburn.  . Rivaroxaban (XARELTO) 15 MG  TABS tablet Take 1 tablet (15 mg total) by mouth daily with supper.  . Simethicone (GAS-X PO) Take by  mouth. Use as needed for gas  . VENTOLIN HFA 108 (90 Base) MCG/ACT inhaler INHALE 2 PUFFS INTO THE LUNGS EVERY 6 (SIX) HOURS AS NEEDED FOR WHEEZING OR SHORTNESS OF BREATH. (Patient taking differently: INHALE 2 PUFFS INTO THE LUNGS EVERY 4 HOURS AS NEEDED FOR WHEEZING OR SHORTNESS OF BREATH Plan B)    FAMILY HISTORY:  His indicated that his mother is deceased. He indicated that his father is deceased. He indicated that his son is alive.    SOCIAL HISTORY: He  reports that he quit smoking about 2 years ago. His smoking use included Cigarettes. He has a 56.00 pack-year smoking history. He has quit using smokeless tobacco. His smokeless tobacco use included Chew. He reports that he does not drink alcohol or use drugs.  REVIEW OF SYSTEMS: Limited by mental status Bolds are positive  Constitutional: weight loss, gain, night sweats, Fevers, chills, fatigue .  HEENT: headaches, Sore throat, sneezing, nasal congestion, post nasal drip, Difficulty swallowing, Tooth/dental problems, visual complaints visual changes, ear ache CV:  chest pain, radiates: ,Orthopnea, PND, swelling in lower extremities, dizziness, palpitations, syncope.  GI  heartburn, indigestion, abdominal pain, nausea, vomiting, diarrhea, change in bowel habits, loss of appetite, bloody stools.  Resp: cough, productive: , hemoptysis, dyspnea, chest pain, pleuritic.  Skin: rash or itching or icterus GU: dysuria, change in color of urine, urgency or frequency. flank pain, hematuria  MS: joint pain or swelling. decreased range of motion Psych: change in mood or affect. depression or anxiety.  Neuro: difficulty with speech, weakness, numbness, ataxia    SUBJECTIVE:  Awake, comfortable. Not in distress.  VITAL SIGNS: BP (!) 69/52   Pulse (!) 49   Temp (!) 95.8 F (35.4 C) (Rectal)   Resp 11   SpO2 93%   HEMODYNAMICS:    VENTILATOR SETTINGS:    INTAKE / OUTPUT: No intake/output data recorded.  PHYSICAL  EXAMINATION: General:  Elderly male in NAD Neuro:  Somnolent but easily arouses, answers appropriately HEENT:  L lower lip laceration with dried blood, no active bleeding Cardiovascular: transcutaneously paced rate 60, underlying sinus brady rate 30. No MRG Lungs:  clear Abdomen:  Soft, non-tender, non-distended Musculoskeletal:  No acute deformity Skin:  Grossly intact with exception of lip lac.   LABS:  BMET  Recent Labs Lab 07/22/16 0309 07/23/16 0928 07/25/16 0130 07/25/16 0144  NA 139 138 135 136  K 4.3 4.7 5.9* 5.9*  CL 105 105 106 106  CO2 24 21* 22  --   BUN 88* 88* 102* 109*  CREATININE 3.81* 3.78* 4.35* 4.10*  GLUCOSE 176* 142* 215* 202*    Electrolytes  Recent Labs Lab 07/22/16 0309 07/23/16 0928 07/25/16 0130  CALCIUM 8.4* 8.7* 10.3    CBC  Recent Labs Lab 07/22/16 0309 07/23/16 0928 07/25/16 0130 07/25/16 0144  WBC 5.2 7.7 7.8  --   HGB 10.5* 11.2* 9.8* 10.9*  HCT 32.5* 34.0* 30.9* 32.0*  PLT 135* 137* 105*  --     Coag's No results for input(s): APTT, INR in the last 168 hours.  Sepsis Markers  Recent Labs Lab 07/19/16 0444 07/21/16 0429  PROCALCITON <0.10 <0.10    ABG No results for input(s): PHART, PCO2ART, PO2ART in the last 168 hours.  Liver Enzymes No results for input(s): AST, ALT, ALKPHOS, BILITOT, ALBUMIN in the last 168 hours.  Cardiac Enzymes No results for  input(s): TROPONINI, PROBNP in the last 168 hours.  Glucose No results for input(s): GLUCAP in the last 168 hours.  Imaging Ct Head Wo Contrast  Result Date: 07/25/2016 CLINICAL DATA:  Weakness.  Status post fall. EXAM: CT HEAD WITHOUT CONTRAST TECHNIQUE: Contiguous axial images were obtained from the base of the skull through the vertex without intravenous contrast. COMPARISON:  Head CT 10/07/2014 FINDINGS: Brain: No mass lesion, intraparenchymal hemorrhage or extra-axial collection. No evidence of acute cortical infarct. Brain parenchyma and CSF-containing  spaces are normal for age. Vascular: No hyperdense vessel or unexpected calcification. Skull: Normal visualized skull base, calvarium and extracranial soft tissues. Sinuses/Orbits: No sinus fluid levels or advanced mucosal thickening. No mastoid effusion. Normal orbits. IMPRESSION: Normal head CT for age. Electronically Signed   By: Deatra RobinsonKevin  Herman M.D.   On: 07/25/2016 02:29   Dg Chest Port 1 View  Result Date: 07/25/2016 CLINICAL DATA:  Weakness.  Fall tonight. EXAM: PORTABLE CHEST 1 VIEW COMPARISON:  07/18/2016 FINDINGS: Cardiac enlargement without vascular congestion. Small bilateral pleural effusions with basilar infiltration or atelectasis, greater on the right. No pneumothorax. Calcification of the aorta. Similar appearance to previous study. IMPRESSION: Bilateral pleural effusions with basilar atelectasis or infiltration, greater on the right. Electronically Signed   By: Burman NievesWilliam  Stevens M.D.   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 Also hyperkalemia H/o AF RVR s/p DCCV 11/4 Cardiogenic shock  P:  Transcutaneous pacing Dopamine, epinephrine for HR, BP control Keep MAP > 65, HR > 50 To Cath lab for transvenous pacing.  Cardiology primary Holding outpatient meds Glucagon drip  RENAL A:   CKD Hyperkalemia > temporized in ED  P:   Bicarb gtt for hyperK and acidosis Serial BMP Not a dialysis candidate  GASTROINTESTINAL A:   GERD  P:    NPO Pepcid  HEMATOLOGIC A:   Anemia  P:  Follow CBC  INFECTIOUS A:   No acute issues  P:     ENDOCRINE A:   DM    P:   CBG monitoring and SSI  NEUROLOGIC A:   Acute metabolic encephalopathy P:   RASS goal: 0    FAMILY  - Updates: Wife and daughter updated. Say he would not want to be on life support. Will enact DNR/DNI, which was established during palliative care evaluation on last admit. Shocks ok for cardioversion, defibrillation.   - Inter-disciplinary family meet or Palliative Care meeting due by:  11/15  APP critical care time 45 mins  Joneen RoachPaul Hoffman, [facility] CenterGACNP-BC [facility] Pager [phone] or [phone]  07/25/2016 3:42 AM    ATTENDING NOTE / ATTESTATION NOTE :   I have discussed the case with the resident/APP Joneen RoachPaul Hoffman.   I agree with the resident/APP's  history, physical examination, assessment, and plans.    I have edited the above note and modified it according to our agreed history, physical examination, assessment and plan.   Briefly, patient admitted after sustaining a  fall after being lightheaded. Patient has a lot of comorbidities including CAD, CHF, chronic kidney disease, COPD, chronic hypoxemia. He was just admitted recently for acute on chronic diastolic heart failure exacerbation. He was discharged relatively improved. He felt lightheaded, had a fall, subsequently being transferred to the emergency room. He was drowsy, heart rate in the 20s. Heart rate did not improve with transcutaneous pacing so he ended up getting a transvenous pacing. He was also placed on dopamine drip. No issues during transvenous pacing. I saw him in the ICU after the procedure. He was comfortable. No subjective complaints. No chest pain. Blood pressure and heart rate stable. Dopamine being tapered down.  Physical examination as mentioned. Awake, comfortable, not in distress. Blood pressure 100/55, pulse of 97, respiratory rate of 20,  sats of 97% on 3 L oxygen. HEENT exam showed a laceration over the left lower lip related to fall, (+) dried blood. (-) NVD. Good air entry, some crackles at the bases. No rhonchi or wheezing. Cardiac exam showed good S1 and S2. No S3 murmur rub or gallop. Abdominal exam was benign. Extremities revealed grade 1 edema. No clubbing cyanosis. Skin was warm and dry.  Labs reviewed. ABG 7.1, 63, 45 and this was before the transvenous pacing. Creatinine chronically elevated. Currently at 4.10. Bicarbonate 22. WBC of 8. Chest x-ray with cardiomegaly, bilateral effusion. Chest x-ray was similar to November 1 chest x-ray.  Assessment : 1. Complete heart block. Patient was on AV blocking medications. Post transvenous pacing. 2. Cardiogenic shock related to above. 3. COPD. Not in exacerbation. 4. AK I/chronic kidney disease. Not a candidate for hemodialysis given comorbidities. 5. Paroxysmal atrial fibrillation.  Plan : 1. ABG was taken when patient was bradycardic and drowsy. Currently, he is clinically improved. Will observe respiratory and mental status. Start pulmicort BID and duoneb qid in lieu of symbicort (outpt meds). Keep O2 saturation > 88-90%. He is currently on oxygen 2 L at bedtime. COPD does not seem to be in exacerbation. 2. Bradycardia per cardiology. I anticipate, plan is to wean off the dopamine. 3. Continue other medicines per primary. 4. No evidence for infection at this point. Observe for fever, increase in white blood cell count.  I have spent 30 minutes of critical care time with this patient today.  Family :Family updated at length today. Spoke to patient's wife and grandchildren.  Pollie Meyer, MD 07/25/2016, 5:13 AM [facility] Pager [phone] After 3 pm or if no answer, call [phone]

## 2016-07-25 NOTE — ED Provider Notes (Signed)
[facility]-EMERGENCY DEPT Provider Note   CSN: [id number] Arrival date & time: 07/25/16  0120  By signing my name below, I, Modena Jansky, attest that this documentation has been prepared under the direction and in the presence of Zadie Rhine, MD . Electronically Signed: Modena Jansky, Scribe. 07/25/2016. 1:23 AM.  History   Chief Complaint Chief Complaint  Patient presents with  . Bradycardia   The history is provided by the patient and the EMS personnel. The history is limited by the condition of the patient. No language interpreter was used.  Fall  This is a new problem. The current episode started 1 to 2 hours ago. The problem occurs constantly. The problem has not changed since onset.Nothing aggravates the symptoms. Nothing relieves the symptoms. He has tried nothing for the symptoms.    LEVEL 5 CAVEAT DUE TO ACUITY OF CONDITION  HPI Comments: LENARD KAMPF is a 70 y.o. male who presents to the Emergency Department complaining of a fall that occurred about an hour ago. Pt states he was ambulating when he felt lightheaded and fell. He reports associated symptoms of wounds to his face and LUE. He reports no modiying factors. He denies any history of dialysis, neck pain, fever, chest pain, SOB, or other complaints.    PCP: Leo Grosser, MD  Past Medical History:  Diagnosis Date  . AAA (abdominal aortic aneurysm) (HCC)    a. 09/2013: Resection and grafting of abdominal aortic aneurysm with insertion of an aorto by common iliac graft using 18 x 9 mm Hemashield Dacron graft.  . Arthritis    "little in my hands" (09/08/2013)  . Chronic diastolic CHF (congestive heart failure) (HCC)    a.  Echo (08/13/2013): EF 55-60%, normal wall motion, mild MR, mild LAE  . Chronic kidney disease (CKD), stage III (moderate)   . COPD (chronic obstructive pulmonary disease) (HCC)   . 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 GERD (gastroesophageal reflux disease)   . H/O hiatal  hernia   . Hx of cardiovascular stress test    a. Myoview 09/2012:  no scar or ischemia  . Hyperlipidemia   . Hypertension   . On home oxygen therapy    "2L @ night" (07/17/2016)  . PAF (paroxysmal atrial fibrillation) (HCC)    a. amiodarone Rx started 08/2013;  b. s/p TEE-DCCV 09/2013 => recurrent AFib => repeat DCCV => NSR;   c. Xarelto 15 QD due to CKD  . Pulmonary hypertension associated with unclear multi-factorial mechanisms    Only on echo (8/17).      Patient Active Problem List   Diagnosis Date Noted  . DNR (do not resuscitate)   . Palliative care encounter   . PAH (pulmonary artery hypertension)   . COPD exacerbation (HCC) 07/17/2016  . Acute right heart failure 07/17/2016  . Acute on chronic respiratory failure with hypoxia (HCC) 07/17/2016  . Metabolic acidosis 07/17/2016  . Anemia in CKD (chronic kidney disease) 07/17/2016  . Pleural effusion on right 07/17/2016  . Pleural effusion, bilateral  L > R  05/09/2016  . 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 SOB (shortness of breath)   . Bronchiectasis with acute exacerbation (HCC)   . CKD (chronic kidney disease), stage IV (HCC) 09/01/2014  . PAF (paroxysmal atrial fibrillation) (HCC) 09/01/2014  . Transaminitis 09/01/2014  . Goals of care, counseling/discussion 12/29/2013  . Protein calorie malnutrition (HCC) 10/06/2013  . COPD GOLD II with marked reversibility  09/19/2013  . Acute  on chronic diastolic CHF (congestive heart failure) (HCC) 09/08/2013  . Long term (current) use of anticoagulants 08/19/2013  . Hyperkalemia 08/14/2013  . Tobacco abuse 08/11/2013  . HTN (hypertension) 01/07/2013  . HLD (hyperlipidemia) 01/07/2013  . AAA (abdominal aortic aneurysm) (HCC) 10/13/2012  . Atherosclerosis of native arteries of the extremities with intermittent claudication 10/07/2012    Past Surgical History:  Procedure Laterality Date  . ABDOMINAL AORTIC ANEURYSM REPAIR  10/15/2012   Procedure: ANEURYSM  ABDOMINAL AORTIC REPAIR;  Surgeon: Pryor Ochoa, MD;  Location: [facility] OR;  Service: Vascular;  Laterality: N/A;  Resection and Grafting of Abdominal Aortic Aneurysm - Aortobi-Iliac   . ABDOMINAL AORTIC ANEURYSM REPAIR  10-15-2012  . CARDIOVERSION N/A 09/22/2013   Procedure: CARDIOVERSION;  Surgeon: Lars Masson, MD;  Location: [facility] ENDOSCOPY;  Service: Cardiovascular;  Laterality: N/A;  . CARDIOVERSION  Jan. 12, 2015   Dr. Doylene Canning. Ladona Ridgel  . CARDIOVERSION N/A 09/28/2013   Procedure: CARDIOVERSION;  Surgeon: Marinus Maw, MD;  Location: [facility] CATH LAB;  Service: Cardiovascular;  Laterality: N/A;  . CARDIOVERSION N/A 06/04/2016   Procedure: CARDIOVERSION;  Surgeon: Lars Masson, MD;  Location: [facility] ENDOSCOPY;  Service: Cardiovascular;  Laterality: N/A;  . CARDIOVERSION N/A 07/21/2016   Procedure: CARDIOVERSION;  Surgeon: Dolores Patty, MD;  Location: [facility] OR;  Service: Cardiovascular;  Laterality: N/A;  . INGUINAL HERNIA REPAIR Right 12/22/2014   Procedure: OPEN RIGHT INGUINAL HERNIA REPAIR WITH MESH;  Surgeon: Axel Filler, MD;  Location: [facility] ORS;  Service: General;  Laterality: Right;  . INSERTION OF MESH N/A 12/22/2014   Procedure: INSERTION OF MESH;  Surgeon: Axel Filler, MD;  Location: [facility] ORS;  Service: General;  Laterality: N/A;  . KNEE ARTHROSCOPY Right   . TEE WITHOUT CARDIOVERSION N/A 09/22/2013   Procedure: TRANSESOPHAGEAL ECHOCARDIOGRAM (TEE);  Surgeon: Lars Masson, MD;  Location: [facility] ENDOSCOPY;  Service: Cardiovascular;  Laterality: N/A;       Home Medications    Prior to Admission medications   Medication Sig Start Date End Date Taking? Authorizing Provider  atorvastatin (LIPITOR) 10 MG tablet Take 10 mg by mouth at bedtime.     Historical Provider, MD  budesonide-formoterol (SYMBICORT) 80-4.5 MCG/ACT inhaler Inhale 2 puffs into the lungs 2 (two) times daily. 06/05/16   Nyoka Cowden, MD  dextromethorphan-guaiFENesin [facility] DM) 30-600 MG 12hr tablet Take 1 tablet by  mouth 2 (two) times daily as needed.     Historical Provider, MD  diltiazem (TIAZAC) 180 MG 24 hr capsule TAKE 2 CAPSULES BY MOUTH EVERY DAY 07/18/16   Donita Brooks, MD  doxazosin (CARDURA) 4 MG tablet Take 1 tablet (4 mg total) by mouth daily. 05/30/16   Donita Brooks, MD  flecainide (TAMBOCOR) 100 MG tablet Take 1 tablet (100 mg total) by mouth every 12 (twelve) hours. 07/23/16   Tyrone Nine, MD  furosemide (LASIX) 40 MG tablet Take 1 tablet (40 mg total) by mouth 2 (two) times daily. 07/23/16   Tyrone Nine, MD  metoprolol succinate (TOPROL-XL) 100 MG 24 hr tablet Take 1 tablet (100 mg total) by mouth daily. Take with or immediately following a meal. 04/06/16   Donita Brooks, MD  OXYGEN Use 2 L at bedtime and as needed for shortness of breath and activity    Historical Provider, MD  ranitidine (ZANTAC) 150 MG capsule Take 150 mg by mouth daily as needed for heartburn.    Historical Provider, MD  Rivaroxaban (XARELTO) 15 MG TABS  tablet Take 1 tablet (15 mg total) by mouth daily with supper. 01/10/16   Donita BrooksWarren T Pickard, MD  Simethicone (GAS-X PO) Take by mouth. Use as needed for gas    Historical Provider, MD  VENTOLIN HFA 108 (90 Base) MCG/ACT inhaler INHALE 2 PUFFS INTO THE LUNGS EVERY 6 (SIX) HOURS AS NEEDED FOR WHEEZING OR SHORTNESS OF BREATH. Patient taking differently: INHALE 2 PUFFS INTO THE LUNGS EVERY 4 HOURS AS NEEDED FOR WHEEZING OR SHORTNESS OF BREATH Plan B 11/29/15   Donita BrooksWarren T Pickard, MD    Family History Family History  Problem Relation Age of Onset  . Diabetes Mother   . Tuberculosis Mother   . Heart disease Father   . Heart attack Father   . Heart disease Son     Blood clot:  Arm    Social History Social History  Substance Use Topics  . Smoking status: Former Smoker    Packs/day: 1.00    Years: 56.00    Types: Cigarettes    Quit date: 07/31/2013  . Smokeless tobacco: Former NeurosurgeonUser    Types: Chew  . Alcohol use No     Allergies   Niaspan [niacin er] and  Amiodarone   Review of Systems Review of Systems  Unable to perform ROS: Acuity of condition     Physical Exam Updated Vital Signs Pulse (!) 28   Temp (!) 95.8 F (35.4 C)   Resp 13   SpO2 (!) 86%   Physical Exam CONSTITUTIONAL: Ill appearing HEAD: Normocephalic/atraumatic EYES: EOMI/PERRL ENMT: Mucous membranes moist, laceration to left upper buccal mucosa, no active bleeding, no nasal trauma noted NECK: supple no meningeal signs SPINE/BACK:entire spine nontender, No bruising/crepitance/stepoffs noted to spine CV: significant bradycardia noted LUNGS: Tachypnea and coarse breath sounds bilaterally ABDOMEN: soft, nontender, no rebound or guarding, bowel sounds noted throughout abdomen GU:no cva tenderness NEURO: Pt is awake/alert, moves all extremitiesx4.  No facial droop.   EXTREMITIES: pulses normal/equal, full ROM, scattered bruising and skin tears noted to extremities, no deformities noted, no tenderness noted SKIN: warm, color normal PSYCH: no abnormalities of mood noted, alert and oriented to situation   ED Treatments / Results  DIAGNOSTIC STUDIES: Oxygen Saturation is 86% on 10 L/min, abnormal by my interpretation.    COORDINATION OF CARE: 1:29 AM- Pt advised of plan for treatment and pt agrees.  Labs (all labs ordered are listed, but only abnormal results are displayed) Labs Reviewed  BASIC METABOLIC PANEL - Abnormal; Notable for the following:       Result Value   Potassium 5.9 (*)    Glucose, Bld 215 (*)    BUN 102 (*)    Creatinine, Ser 4.35 (*)    GFR calc non Af Amer 12 (*)    GFR calc Af Amer 14 (*)    All other components within normal limits  CBC WITH DIFFERENTIAL/PLATELET - Abnormal; Notable for the following:    RBC 3.35 (*)    Hemoglobin 9.8 (*)    HCT 30.9 (*)    RDW 15.8 (*)    All other components within normal limits  I-STAT CHEM 8, ED - Abnormal; Notable for the following:    Potassium 5.9 (*)    BUN 109 (*)    Creatinine, Ser 4.10  (*)    Glucose, Bld 202 (*)    Hemoglobin 10.9 (*)    HCT 32.0 (*)    All other components within normal limits  I-STAT TROPOININ, ED    EKG  EKG Interpretation  Date/Time:  Wednesday July 25 2016 01:33:46 EST Ventricular Rate:  57 PR Interval:    QRS Duration: 107 QT Interval:  393 QTC Calculation: 383 R Axis:   -120 Text Interpretation:  Junctional bradycardia Consider left atrial enlargement Low voltage, extremity and precordial leads Repol abnrm suggests ischemia, anterolateral Abnormal ekg Confirmed by Bebe ShaggyWICKLINE  MD, Galdino Hinchman ([id number]) on 07/25/2016 1:37:33 AM       Radiology Ct Head Wo Contrast  Result Date: 07/25/2016 CLINICAL DATA:  Weakness.  Status post fall. EXAM: CT HEAD WITHOUT CONTRAST TECHNIQUE: Contiguous axial images were obtained from the base of the skull through the vertex without intravenous contrast. COMPARISON:  Head CT 10/07/2014 FINDINGS: Brain: No mass lesion, intraparenchymal hemorrhage or extra-axial collection. No evidence of acute cortical infarct. Brain parenchyma and CSF-containing spaces are normal for age. Vascular: No hyperdense vessel or unexpected calcification. Skull: Normal visualized skull base, calvarium and extracranial soft tissues. Sinuses/Orbits: No sinus fluid levels or advanced mucosal thickening. No mastoid effusion. Normal orbits. IMPRESSION: Normal head CT for age. Electronically Signed   By: Deatra RobinsonKevin  Herman M.D.   On: 07/25/2016 02:29   Dg Chest Port 1 View  Result Date: 07/25/2016 CLINICAL DATA:  Weakness.  Fall tonight. EXAM: PORTABLE CHEST 1 VIEW COMPARISON:  07/18/2016 FINDINGS: Cardiac enlargement without vascular congestion. Small bilateral pleural effusions with basilar infiltration or atelectasis, greater on the right. No pneumothorax. Calcification of the aorta. Similar appearance to previous study. IMPRESSION: Bilateral pleural effusions with basilar atelectasis or infiltration, greater on the right. Electronically Signed   By:  Burman NievesWilliam  Stevens M.D.   On: 07/25/2016 02:01    Procedures Procedures (including critical care time) CRITICAL CARE Performed by: Joya GaskinsWICKLINE,Onur Mori W Total critical care time: 35 minutes Critical care time was exclusive of separately billable procedures and treating other patients. Critical care was necessary to treat or prevent imminent or life-threatening deterioration. Critical care was time spent personally by me on the following activities: development of treatment plan with patient and/or surrogate as well as nursing, discussions with consultants, evaluation of patient's response to treatment, examination of patient, obtaining history from patient or surrogate, ordering and performing treatments and interventions, ordering and review of laboratory studies, ordering and review of radiographic studies, pulse oximetry and re-evaluation of patient's condition.  Medications Ordered in ED Medications  DOPamine (INTROPIN) 800 mg in dextrose 5 % 250 mL (3.2 mg/mL) infusion (not administered)  sodium chloride 0.9 % bolus 1,000 mL (not administered)  calcium chloride injection 1 g (1 g Intravenous Given 07/25/16 0133)  fentaNYL (SUBLIMAZE) injection 50 mcg (50 mcg Intravenous Given 07/25/16 0148)  insulin aspart (novoLOG) injection 10 Units (10 Units Intravenous Given 07/25/16 0153)  dextrose 50 % solution 50 mL (50 mLs Intravenous Given 07/25/16 0151)     Initial Impression / Assessment and Plan / ED Course  I have reviewed the triage vital signs and the nursing notes.  Pertinent labs & imaging results that were available during my care of the patient were reviewed by me and considered in my medical decision making (see chart for details).  Clinical Course     2:12 AM Pt seen on arrival for syncopal event Pt is noted to be hypotensive and bradycardic He was recently in hospital and is on multiple cardiac meds (metoprolol/cardizem) and this could be contributing to his bradycardia He was  noted to have h/o chronic renal failure, and was given calcium empirically as concern for hyperkalemia high  No change from calcium.  Pt  was externally paced and pt is tolerating well His HR is improved and his BP is improving He is awake/alert Ct head ordered as he is on anticoagulants and he hit his head As for laceration to lip, defer repair at this time as involving buccal mucosa, likely will heal on its own   D/w cardiology fellow, he is considering placing temporary pacemaker He requests critical care consult as well  2:37 AM Dr Algie Coffer with cardiology at bedside Critical care also here to see patient Dopamine has been started by cardiology Pt will be admitted Final Clinical Impressions(s) / ED Diagnoses   Final diagnoses:  Bradycardia  Hypotension, unspecified hypotension type    New Prescriptions New Prescriptions   No medications on file   I personally performed the services described in this documentation, which was scribed in my presence. The recorded information has been reviewed and is accurate.         Zadie Rhine, MD 07/25/16 [phone]

## 2016-07-25 NOTE — Consult Note (Signed)
ELECTROPHYSIOLOGY CONSULT NOTE    Patient ID: Mark [name] MRN: [id number], DOB/AGE: [birth year] 70 y.o.  Admit date: 07/25/2016 Date of Consult: 07/25/2016   Primary Physician: Leo GrosserPICKARD,WARREN TOM, MD Primary Cardiologist: Dr. Sanjuana KavaMcAlhaney  Reason for Consultation: profound bradycardia  HPI: Mark BlendHorace T Newbury is a 70 y.o. male chronic diastolic heart failure, severe COPD on home O2, atrial fibrillation/flutter (previously on amiodarone but d/c'd 2/2 lung issues), CKD stage IV, AAA s/p repair, and pulmonary hypertension.  He was first diagnosed with atrial fibrillation in 2014 and was placed on amiodarone at that time for rhythm control. He did well post cardioversion maintaining SR until a month or so ago when he was found to be in atrial flutter after discontinuing amiodarone 2/2 concerns from pulmonary standpoint.   He was hospitalized recently, only discharged 07/23/16 and evaluated by EP, Dr. Johney FrameAllred who noted: "He has likely cor pulmonale with severe pulmonary hypertension, RV dysfunction, severe TR, and severe RA enlargement.  His atrial arrhythmias are secondary to this and are unlikely to be controlled long term.  Would most likely be best served with rate control going forward.  Given renal failure, he is not a candidate for tikosyn or sotalol.  Amiodarone has been stopped due to respiratory symptoms.  Could consider flecainide as he does not have known CAD.  Would not advise ablation for this patient"  It appears he was ultimately started on Flecainide and what was called palliative DCCV and SR was restored.  Discharge summary reports endstage RH failure, noting presumptive prognosis with refractory volume status is <6 months he qualifies for hospice services, but declines this at this time. He remains upbeat about his condition and Mark [name] accept home palliative services after discharge.  Readmitted early this morning after a syncopal event found with profound bradycardia/CHB requiring  emergent transvenous pacing, and dopamine gtt which he remains with SBP 90's-low 100 range.  EP is being asked to evaluate regarding this.   HOME meds include: Diltiazem 180mg  2caps daily, Flecainide 100mg  BID, and metoprolol succ 100mg  daily, all of which he took yesterday as instructed, held since here.   LABS: K+ 5.4 BUN/Creat 100/4.44 H/H 10/33 WBC 12.0plts 159    Past Medical History:  Diagnosis Date  . AAA (abdominal aortic aneurysm) (HCC)    a. 09/2013: Resection and grafting of abdominal aortic aneurysm with insertion of an aorto by common iliac graft using 18 x 9 mm Hemashield Dacron graft.  . Arthritis    "little in my hands" (09/08/2013)  . Chronic diastolic CHF (congestive heart failure) (HCC)    a.  Echo (08/13/2013): EF 55-60%, normal wall motion, mild MR, mild LAE  . Chronic kidney disease (CKD), stage III (moderate)   . COPD (chronic obstructive pulmonary disease) (HCC)   . 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 GERD (gastroesophageal reflux disease)   . H/O hiatal hernia   . Hx of cardiovascular stress test    a. Myoview 09/2012:  no scar or ischemia  . Hyperlipidemia   . Hypertension   . On home oxygen therapy    "2L @ night" (07/17/2016)  . PAF (paroxysmal atrial fibrillation) (HCC)    a. amiodarone Rx started 08/2013;  b. s/p TEE-DCCV 09/2013 => recurrent AFib => repeat DCCV => NSR;   c. Xarelto 15 QD due to CKD  . Pulmonary hypertension associated with unclear multi-factorial mechanisms    Only on echo (8/17).       Surgical History:  Past Surgical History:  Procedure Laterality Date  . ABDOMINAL AORTIC ANEURYSM REPAIR  10/15/2012   Procedure: ANEURYSM ABDOMINAL AORTIC REPAIR;  Surgeon: Pryor Ochoa, MD;  Location: [facility] OR;  Service: Vascular;  Laterality: N/A;  Resection and Grafting of Abdominal Aortic Aneurysm - Aortobi-Iliac   . ABDOMINAL AORTIC ANEURYSM REPAIR  10-15-2012  . CARDIAC CATHETERIZATION N/A 07/25/2016   Procedure:  Temporary Pacemaker;  Surgeon: Orpah Cobb, MD;  Location: [facility] INVASIVE CV LAB;  Service: Cardiovascular;  Laterality: N/A;  . CARDIAC CATHETERIZATION N/A 07/25/2016   Procedure: Temporary Pacemaker;  Surgeon: Orpah Cobb, MD;  Location: [facility] INVASIVE CV LAB;  Service: Cardiovascular;  Laterality: N/A;  . CARDIOVERSION N/A 09/22/2013   Procedure: CARDIOVERSION;  Surgeon: Lars Masson, MD;  Location: [facility] ENDOSCOPY;  Service: Cardiovascular;  Laterality: N/A;  . CARDIOVERSION  Jan. 12, 2015   Dr. Doylene Canning. Ladona Ridgel  . CARDIOVERSION N/A 09/28/2013   Procedure: CARDIOVERSION;  Surgeon: Marinus Maw, MD;  Location: [facility] CATH LAB;  Service: Cardiovascular;  Laterality: N/A;  . CARDIOVERSION N/A 06/04/2016   Procedure: CARDIOVERSION;  Surgeon: Lars Masson, MD;  Location: [facility] ENDOSCOPY;  Service: Cardiovascular;  Laterality: N/A;  . CARDIOVERSION N/A 07/21/2016   Procedure: CARDIOVERSION;  Surgeon: Dolores Patty, MD;  Location: [facility] OR;  Service: Cardiovascular;  Laterality: N/A;  . INGUINAL HERNIA REPAIR Right 12/22/2014   Procedure: OPEN RIGHT INGUINAL HERNIA REPAIR WITH MESH;  Surgeon: Axel Filler, MD;  Location: [facility] ORS;  Service: General;  Laterality: Right;  . INSERTION OF MESH N/A 12/22/2014   Procedure: INSERTION OF MESH;  Surgeon: Axel Filler, MD;  Location: [facility] ORS;  Service: General;  Laterality: N/A;  . KNEE ARTHROSCOPY Right   . TEE WITHOUT CARDIOVERSION N/A 09/22/2013   Procedure: TRANSESOPHAGEAL ECHOCARDIOGRAM (TEE);  Surgeon: Lars Masson, MD;  Location: [facility] ENDOSCOPY;  Service: Cardiovascular;  Laterality: N/A;     Prescriptions Prior to Admission  Medication Sig Dispense Refill Last Dose  . albuterol (PROVENTIL HFA;VENTOLIN HFA) 108 (90 Base) MCG/ACT inhaler Inhale 1-2 puffs into the lungs every 4 (four) hours as needed for wheezing or shortness of breath.   07/24/2016 at Unknown time  . atorvastatin (LIPITOR) 10 MG tablet Take 10 mg by mouth at bedtime.    07/24/2016 at  Unknown time  . budesonide-formoterol (SYMBICORT) 80-4.5 MCG/ACT inhaler Inhale 2 puffs into the lungs 2 (two) times daily. 1 Inhaler 0 07/24/2016 at Unknown time  . dextromethorphan-guaiFENesin (MUCINEX DM) 30-600 MG 12hr tablet Take 1 tablet by mouth 2 (two) times daily as needed for cough.    07/24/2016 at Unknown time  . diltiazem (TIAZAC) 180 MG 24 hr capsule TAKE 2 CAPSULES BY MOUTH EVERY DAY 60 capsule 5 07/24/2016 at Unknown time  . doxazosin (CARDURA) 4 MG tablet Take 1 tablet (4 mg total) by mouth daily. 90 tablet 3 07/24/2016 at Unknown time  . flecainide (TAMBOCOR) 100 MG tablet Take 1 tablet (100 mg total) by mouth every 12 (twelve) hours. 60 tablet 0 07/24/2016 at Unknown time  . furosemide (LASIX) 40 MG tablet Take 1 tablet (40 mg total) by mouth 2 (two) times daily. 30 tablet 5 07/24/2016 at Unknown time  . metoprolol succinate (TOPROL-XL) 100 MG 24 hr tablet Take 1 tablet (100 mg total) by mouth daily. Take with or immediately following a meal. 90 tablet 3 07/24/2016 at 2100  . ranitidine (ZANTAC) 150 MG capsule Take 150 mg by mouth daily as needed for heartburn.   07/24/2016 at Unknown  time  . Rivaroxaban (XARELTO) 15 MG TABS tablet Take 1 tablet (15 mg total) by mouth daily with supper. 30 tablet 11 07/24/2016 at 1800  . simethicone (MYLICON) 80 MG chewable tablet Chew 80 mg by mouth every 6 (six) hours as needed for flatulence.   07/24/2016 at Unknown time  . OXYGEN Use 2 L at bedtime and as needed for shortness of breath and activity   07/17/2016 at Unknown time  . VENTOLIN HFA 108 (90 Base) MCG/ACT inhaler INHALE 2 PUFFS INTO THE LUNGS EVERY 6 (SIX) HOURS AS NEEDED FOR WHEEZING OR SHORTNESS OF BREATH. (Patient not taking: Reported on 07/25/2016) 18 g 5 Not Taking at Unknown time    Inpatient Medications:  . budesonide (PULMICORT) nebulizer solution  0.5 mg Nebulization BID  . insulin aspart  2-6 Units Subcutaneous Q4H  . ipratropium-albuterol  3 mL Nebulization Q6H  . sodium chloride  flush  3 mL Intravenous Q12H    Allergies:  Allergies  Allergen Reactions  . Niaspan [Niacin Er] Anaphylaxis  . Amiodarone     Stopped due to lung issues    Social History   Social History  . Marital status: Married    Spouse name: [name]  . Number of children: 4  . Years of education: N/A   Occupational History  . Works PT as a Education administratorpainter    Social History Main Topics  . Smoking status: Former Smoker    Packs/day: 1.00    Years: 56.00    Types: Cigarettes    Quit date: 07/31/2013  . Smokeless tobacco: Former NeurosurgeonUser    Types: Chew  . Alcohol use No  . Drug use: No  . Sexual activity: Not Currently   Other Topics Concern  . Not on file   Social History Narrative   Married.  Lives with wife.  Ambulates without assistance.     Family History  Problem Relation Age of Onset  . Diabetes Mother   . Tuberculosis Mother   . Heart disease Father   . Heart attack Father   . Heart disease Son     Blood clot:  Arm     Review of Systems: All other systems reviewed and are otherwise negative except as noted above.  Physical Exam: Vitals:   07/25/16 1300 07/25/16 1355 07/25/16 1400 07/25/16 1447  BP: 105/71  104/68   Pulse: 79 79 79   Resp: 15 17 18    Temp:  98.9 F (37.2 C)    TempSrc:  Axillary    SpO2: 100% 100% 100% 100%  Weight:   165 lb 2 oz (74.9 kg)   Height:   5\' 6"  (1.676 m)     GEN- The patient is well appearing, alert and oriented x 3 today.   HEENT: normocephalic, atraumatic; sclera clear, conjunctiva pink; hearing intact; oropharynx clear; neck supple, no JVP Lymph- no cervical lymphadenopathy Lungs- Clear to ausculation bilaterally, normal work of breathing.  No wheezes, rales, rhonchi Heart- RRR, no murmurs, rubs or gallops, PMI not laterally displaced GI- soft, non-tender, non-distended, bowel sounds present Extremities- no clubbing, cyanosis, 3+ edema MS- no significant deformity or atrophy Skin- warm and dry, no rash or lesion Psych-  euthymic mood, full affect Neuro- no gross deficits observed  Labs:   Lab Results  Component Value Date   WBC 12.0 (H) 07/25/2016   HGB 10.7 (L) 07/25/2016   HCT 33.8 (L) 07/25/2016   MCV 92.6 07/25/2016   PLT 159 07/25/2016    Recent Labs Lab  07/25/16 0533  NA 137  K 5.4*  CL 106  CO2 21*  BUN 100*  CREATININE 4.44*  CALCIUM 8.7*  GLUCOSE 141*      Radiology/Studies:  Ct Head Wo Contrast Result Date: 07/25/2016 CLINICAL DATA:  Weakness.  Status post fall. EXAM: CT HEAD WITHOUT CONTRAST TECHNIQUE: Contiguous axial images were obtained from the base of the skull through the vertex without intravenous contrast. COMPARISON:  Head CT 10/07/2014 FINDINGS: Brain: No mass lesion, intraparenchymal hemorrhage or extra-axial collection. No evidence of acute cortical infarct. Brain parenchyma and CSF-containing spaces are normal for age. Vascular: No hyperdense vessel or unexpected calcification. Skull: Normal visualized skull base, calvarium and extracranial soft tissues. Sinuses/Orbits: No sinus fluid levels or advanced mucosal thickening. No mastoid effusion. Normal orbits. IMPRESSION: Normal head CT for age. Electronically Signed   By: Deatra Robinson M.D.   On: 07/25/2016 02:29     EKG: looks low voltage, junctional escape 30bpm TELEMETRY: V paced 80bpm  07/19/16: TTE Study Conclusions - Left ventricle: The cavity size was normal. Wall thickness was   normal. Systolic function was normal. The estimated ejection   fraction was in the range of 50% to 55%. Wall motion was normal;   there were no regional wall motion abnormalities. - Ventricular septum: Septal motion showed paradox. The contour   showed diastolic flattening and systolic flattening. These   changes are consistent with RV volume and pressure overload. - Mitral valve: There was mild regurgitation. - Left atrium: The atrium was mildly dilated. - Right ventricle: The cavity size was moderately dilated. Systolic    function was mildly to moderately reduced. - Right atrium: The atrium was severely dilated. - Tricuspid valve: There was severe regurgitation directed   centrally. - Pulmonary arteries: Systolic pressure was moderately to severely   increased. PA peak pressure: 73 mm Hg (S).  Assessment and Plan:   1. Syncope     CHB with hypotension requiring vasopressor and emergent temp wire/pacing     Home meds, Flecainide, metoprolol and Diltiazem have been held 2. PAFlutter     CHA2DS2Vasc is 3, on renal dose xarelto out patient, Coleta Grosshans need to resume a/c once pacer decision clear, likely coumadin  D/w Dr. Gala Romney, significant concerns regarding rhythm control, historically patient has done very poorly with his AFlutter Brenetta Penny discuss further with Dr. Elberta Fortis   2. End stage RH failure     Fluid OL, weight up from disharge     Appreciate CHF team, Larrissa Stivers aid in fluid management  3. CRI 4. COPD 5. DM   Signed, Francis Dowse, PA-C 07/25/2016 3:02 PM  I have seen and examined this patient with Francis Dowse.  Agree with above, note added to reflect my findings.  On exam, regular rhythm, no murmurs, lungs clear. Presented to the hospital in complete AV block.  Currently V paced off of pacing wire.  Has acute on chronic renal failure and is not making urine at this time.  With holding his beta blocker and CCB, has had return of conduction. Would continue off of rate controlling medications at this time.  Casha Estupinan possibly be able to remove temporary wire tomorrow.  May need pacemaker in the future but this Dae Highley depend on his renal function and overall outlook on survival.  Aryelle Figg follow per HF team.    Armani Brar M. Candido Flott MD 07/25/2016 8:22 PM

## 2016-07-25 NOTE — ED Notes (Signed)
CBG 198 

## 2016-07-25 NOTE — ED Notes (Signed)
Dopamine titrated up to 20 mcg/kg/min

## 2016-07-25 NOTE — ED Notes (Signed)
MD at bedside. 

## 2016-07-25 NOTE — Progress Notes (Signed)
Pt bladder scan >400 pt had urge to go but unable to start stream. Order for I&O if bladder scan >400. I&O completed by Devota PaceKaylee Quick, RN and Marcellina MillinMindy Hopper, RN.

## 2016-07-25 NOTE — Care Management Note (Signed)
Case Management Note  Patient Details  Name: Hetty BlendHorace T Tribbett MRN: [id number] Date of Birth: [birth year]  Subjective/Objective:     Adm w syncope and brady               Action/Plan: did not want hhc or pal care last adm. Will speak w pt again about home services. Lives w wife, has o2 w [facility]   Expected Discharge Date:                  Expected Discharge Plan:  Home w Home Health Services  In-House Referral:     Discharge planning Services  CM Consult  Post Acute Care Choice:    Choice offered to:     DME Arranged:    DME Agency:     HH Arranged:    HH Agency:     Status of Service:  In process, will continue to follow  If discussed at Long Length of Stay Meetings, dates discussed:    Additional Comments:will follow for hhc vs hospice vs pal care as pt progresses.  Hanley Haysowell, Steel Kerney T, RN 07/25/2016, 10:38 AM

## 2016-07-25 NOTE — ED Notes (Signed)
Family at bedside. 

## 2016-07-25 NOTE — ED Notes (Signed)
Zoll pads placed on patient.  

## 2016-07-25 NOTE — Consult Note (Signed)
PULMONARY / CRITICAL CARE MEDICINE   Name: Mark [name] MRN: [id number] DOB: Feb 13, 1946    ADMISSION DATE:  07/25/2016 CONSULTATION DATE:  07/25/2016  REFERRING MD:  [name], Cardiology  CHIEF COMPLAINT:  Fall, bradycardia  HISTORY OF PRESENT ILLNESS:  Mark ProctorHorace T Medleyis a 70 y.o.malewith medical history significant for abdominal aortic aneurysm status post resection and grafting in 2015, atrial fibrillation on xarelto, CKD stage IV with anemia, dyslipidemia, hypertension, COPD with bronchiectasis and history of recurrent right pleural effusion. He was [facility] for hypoxemic respiratory failure secondary to primarilyacute on chronic diastolic CHF, AF RVR,  and also COPD. Diuresis limited due to renal function and he has been told he is a poor candidate for dialysis. He underwent DCCV 11/4 with return of NSR. He was seen by palliative care and qualifies for hospice, but declined. Was made DNR during that admission. He was discharged on home O2 as palliative measure. He was discharged also on home cardiac meds including beta blocker. 11/8 he fell at home after feeling lightheaded prompting presentation to ED. Upon arrival he was found to be bradycardic (20s) and hypotensive.  He was given atropine and glucagon with little improvement. He was hyperkalemic and was given Calcium, insulin, and bicarb for this. Eventually transcutaneous pacing was started. Cardiology and PCCM consulted.   SUBJECTIVE:  No events overnight, comfortable in bed.  VITAL SIGNS: BP 99/69 (BP Location: Left Arm)   Pulse 80   Temp (!) 93 F (33.9 C) (Oral) Comment: warming blanket applied  Resp 16   Wt 72.3 kg (159 lb 6.3 oz)   SpO2 94%   BMI 25.34 kg/m   HEMODYNAMICS:    VENTILATOR SETTINGS:    INTAKE / OUTPUT: I/O last 3 completed shifts: In: 1435 [I.V.:325; IV Piggyback:1110] Out: -   PHYSICAL EXAMINATION: General:  Elderly male in NAD Neuro:  Somnolent but easily arouses,  answers appropriately HEENT:  L lower lip laceration with dried blood, no active bleeding Cardiovascular: transcutaneously paced rate 60, underlying sinus brady rate 30. No MRG Lungs:  CTA bilaterally Abdomen:  Soft, non-tender, non-distended Musculoskeletal:  No acute deformity Skin:  Grossly intact with exception of lip lac.   LABS:  BMET  Recent Labs Lab 07/23/16 0928 07/25/16 0130 07/25/16 0144 07/25/16 0533  NA 138 135 136 137  K 4.7 5.9* 5.9* 5.4*  CL 105 106 106 106  CO2 21* 22  --  21*  BUN 88* 102* 109* 100*  CREATININE 3.78* 4.35* 4.10* 4.44*  GLUCOSE 142* 215* 202* 141*    Electrolytes  Recent Labs Lab 07/23/16 0928 07/25/16 0130 07/25/16 0533  CALCIUM 8.7* 10.3 8.7*    CBC  Recent Labs Lab 07/23/16 0928 07/25/16 0130 07/25/16 0144 07/25/16 0533  WBC 7.7 7.8  --  12.0*  HGB 11.2* 9.8* 10.9* 10.7*  HCT 34.0* 30.9* 32.0* 33.8*  PLT 137* 105*  --  159    Coag's No results for input(s): APTT, INR in the last 168 hours.  Sepsis Markers  Recent Labs Lab 07/19/16 0444 07/21/16 0429  PROCALCITON <0.10 <0.10    ABG  Recent Labs Lab 07/25/16 0317  PHART 7.107*  PCO2ART 63.3*  PO2ART 45.0*    Liver Enzymes No results for input(s): AST, ALT, ALKPHOS, BILITOT, ALBUMIN in the last 168 hours.  Cardiac Enzymes No results for input(s): TROPONINI, PROBNP in the last 168 hours.  Glucose  Recent Labs Lab 07/25/16 0309 07/25/16 0542 07/25/16 0737  GLUCAP 198* 133* 117*  Imaging Ct Head Wo Contrast  Result Date: 07/25/2016 CLINICAL DATA:  Weakness.  Status post fall. EXAM: CT HEAD WITHOUT CONTRAST TECHNIQUE: Contiguous axial images were obtained from the base of the skull through the vertex without intravenous contrast. COMPARISON:  Head CT 10/07/2014 FINDINGS: Brain: No mass lesion, intraparenchymal hemorrhage or extra-axial collection. No evidence of acute cortical infarct. Brain parenchyma and CSF-containing spaces are normal for  age. Vascular: No hyperdense vessel or unexpected calcification. Skull: Normal visualized skull base, calvarium and extracranial soft tissues. Sinuses/Orbits: No sinus fluid levels or advanced mucosal thickening. No mastoid effusion. Normal orbits. IMPRESSION: Normal head CT for age. Electronically Signed   By: Deatra RobinsonKevin  Herman M.D.   On: 07/25/2016 02:29   Dg Chest Port 1 View  Result Date: 07/25/2016 CLINICAL DATA:  Weakness.  Fall tonight. EXAM: PORTABLE CHEST 1 VIEW COMPARISON:  07/18/2016 FINDINGS: Cardiac enlargement without vascular congestion. Small bilateral pleural effusions with basilar infiltration or atelectasis, greater on the right. No pneumothorax. Calcification of the aorta. Similar appearance to previous study. IMPRESSION: Bilateral pleural effusions with basilar atelectasis or infiltration, greater on the right. Electronically Signed   By: Burman NievesWilliam  Stevens M.D.   On: 07/25/2016 02:01     STUDIES:    CULTURES:   ANTIBIOTICS:   SIGNIFICANT EVENTS: 11/6 - discharge 11/8 admit for complete heart block  LINES/TUBES:   DISCUSSION: 70 year old male with extensive cardiac history presenting with complete heart block, temp pacer was placed.  Renal failure.  Aneuric.  Hyperkalemic.  Was discharged home on hospice.  ASSESSMENT / PLAN:  PULMONARY A: Acute hypercarbic respiratory failure COPD without exacerbation  P:   DNI. Titrate O2 for sat of 88-92%. Duonebs and PRN albuterol.  CARDIOVASCULAR A:  Complete heart block - on amio, BB, CCB at home with renal failure. Also hyperkalemia H/o AF RVR s/p DCCV 11/4 Cardiogenic shock  P:  Transcutaneous pacing Dopamine, epinephrine for HR, BP control - off for now Keep MAP > 65, HR > 50 Cardiology primary Holding outpatient meds  RENAL A:   CKD Hyperkalemia > temporized in ED  P:   Continue bicarb gtt for hyperK and acidosis Serial BMP Not a dialysis candidate Replace electrolytes as  indicated  GASTROINTESTINAL A:   GERD  P:   Heart healthy carb modified diet Pepcid  HEMATOLOGIC A:   Anemia  P:  Follow CBC  INFECTIOUS A:   No acute issues  P:   No need for abx  ENDOCRINE A:   DM    P:   CBG monitoring and SSI  NEUROLOGIC A:   Acute metabolic encephalopathy P:   RASS goal: 0  FAMILY  - Updates: No family bedside, will likely need to hospice involvement.  - Inter-disciplinary family meet or Palliative Care meeting due by:  11/15  The patient is critically ill with multiple organ systems failure and requires high complexity decision making for assessment and support, frequent evaluation and titration of therapies, application of advanced monitoring technologies and extensive interpretation of multiple databases.   Critical Care Time devoted to patient care services described in this note is  45  Minutes. This time reflects time of care of this signee Dr Koren BoundWesam Yacoub. This critical care time does not reflect procedure time, or teaching time or supervisory time of PA/NP/Med student/Med Resident etc but could involve care discussion time.  Alyson ReedyWesam G. Yacoub, M.D. [facility]. Pager: [phone]. After hours pager: [phone].

## 2016-07-25 NOTE — H&P (Signed)
Referring Physician:  ROBEY MASSMANN is an 70 y.o. male.                       Chief Complaint: Syncope  HPI: 70 year old male had fall 2 hour ago. He felt lightheaded. Information obtained from ED notes. One amp. IV calcium given by ED.  Past Medical History:  Diagnosis Date  . AAA (abdominal aortic aneurysm) ([place])    a. 09/2013: Resection and grafting of abdominal aortic aneurysm with insertion of an aorto by common iliac graft using 18 x 9 mm Hemashield Dacron graft.  . Arthritis    "little in my hands" (09/08/2013)  . Chronic diastolic CHF (congestive heart failure) ([place])    a.  Echo (08/13/2013): EF 55-60%, normal wall motion, mild MR, mild LAE  . Chronic kidney disease (CKD), stage III (moderate)   . COPD (chronic obstructive pulmonary disease) ([place])   . Diabetes mellitus without complication ([place])    TYPE 2  . GERD (gastroesophageal reflux disease)   . H/O hiatal hernia   . Hx of cardiovascular stress test    a. Myoview 09/2012:  no scar or ischemia  . Hyperlipidemia   . Hypertension   . On home oxygen therapy    "2L @ night" (07/17/2016)  . PAF (paroxysmal atrial fibrillation) (H[facility])    a. amiodarone Rx started 08/2013;  b. s/p TEE-DCCV 09/2013 => recurrent AFib => repeat DCCV => NSR;   c. Xarelto 15 QD due to CKD  . Pulmonary hypertension associated with unclear multi-factorial mechanisms    Only on echo (8/17).        Past Surgical History:  Procedure Laterality Date  . ABDOMINAL AORTIC ANEURYSM REPAIR  10/15/2012   Procedure: ANEURYSM ABDOMINAL AORTIC REPAIR;  Surgeon: Mal Misty, MD;  Location: [facility] OR;  Service: Vascular;  Laterality: N/A;  Resection and Grafting of Abdominal Aortic Aneurysm - Aortobi-Iliac   . ABDOMINAL AORTIC ANEURYSM REPAIR  10-15-2012  . CARDIOVERSION N/A 09/22/2013   Procedure: CARDIOVERSION;  Surgeon: Dorothy Spark, MD;  Location: [place];  Service: Cardiovascular;  Laterality: N/A;  . CARDIOVERSION  Jan. 12, 2015   Dr. Champ Mungo. Lovena Le   . CARDIOVERSION N/A 09/28/2013   Procedure: CARDIOVERSION;  Surgeon: Evans Lance, MD;  Location[facility] C[facility] LAB;  Service: Cardiovascular;  Laterality: N/A;  . CARDIOVERSION N/A 06/04/2016   Procedure: CARDIOVERSION;  Surgeon: Dorothy Spark, MD;  Location: [facility] ENDOSCOPY;  Service: Cardiovascular;  Laterality: N/A;  . CARDIOVERSION N/A 07/21/2016   Procedure: CARDIOVERSION;  Surgeon: Jolaine Artist, MD;  Location: [place];  Service: Cardiovascular;  Laterality: N/A;  . INGUINAL HERNIA REPAIR Right 12/22/2014   Procedure: OPEN RIGHT INGUINAL HERNIA REPAIR WITH MESH;  Surgeon: Ralene Ok, MD;  Location: [facility] ORS;  Service: General;  Laterality: Right;  . INSERTION OF MESH N/A 12/22/2014   Procedure: INSERTION OF MESH;  Surgeon: Ralene Ok, MD;  Location: [facility] ORS;  Service: General;  Laterality: N/A;  . KNEE ARTHROSCOPY Right   . TEE WITHOUT CARDIOVERSION N/A 09/22/2013   Procedure: TRANSESOPHAGEAL ECHOCARDIOGRAM (TEE);  Surgeon: Dorothy Spark, MD;  Location: [facility] ENDOSCOPY;  Service: Cardiovascular;  Laterality: N/A;    Family History  Problem Relation Age of Onset  . Diabetes Mother   . Tuberculosis Mother   . Heart disease Father   . Heart attack Father   . Heart disease Son     Blood clot:  Arm   Social  History:  reports that he quit smoking about 2 years ago. His smoking use included Cigarettes. He has a 56.00 pack-year smoking history. He has quit using smokeless tobacco. His smokeless tobacco use included Chew. He reports that he does not drink alcohol or use drugs.  Allergies:  Allergies  Allergen Reactions  . Niaspan [Niacin Er] Anaphylaxis  . Amiodarone     Stopped due to lung issues     (Not in a hospital admission)  Results for orders placed or performed during the hospital encounter of 07/25/16 (from the past 48 hour(s))  Basic metabolic panel     Status: Abnormal   Collection Time: 07/25/16  1:30 AM  Result Value Ref Range   Sodium 135 135 - 145 mmol/L    Potassium 5.9 (H) 3.5 - 5.1 mmol/L    Comment: DELTA CHECK NOTED   Chloride 106 101 - 111 mmol/L   CO2 22 22 - 32 mmol/L   Glucose, Bld 215 (H) 65 - 99 mg/dL   BUN 102 (H) 6 - 20 mg/dL   Creatinine, Ser 4.35 (H) 0.61 - 1.24 mg/dL   Calcium 10.3 8.9 - 10.3 mg/dL   GFR calc non Af Amer 12 (L) >60 mL/min   GFR calc Af Amer 14 (L) >60 mL/min    Comment: (NOTE) The eGFR has been calculated using the CKD EPI equation. This calculation has not been validated in all clinical situations. eGFR's persistently <60 mL/min signify possible Chronic Kidney Disease.    Anion gap 7 5 - 15  CBC with Differential/Platelet     Status: Abnormal   Collection Time: 07/25/16  1:30 AM  Result Value Ref Range   WBC 7.8 4.0 - 10.5 K/uL   RBC 3.35 (L) 4.22 - 5.81 MIL/uL   Hemoglobin 9.8 (L) 13.0 - 17.0 g/dL   HCT 30.9 (L) 39.0 - 52.0 %   MCV 92.2 78.0 - 100.0 fL   MCH 29.3 26.0 - 34.0 pg   MCHC 31.7 30.0 - 36.0 g/dL   RDW 15.8 (H) 11.5 - 15.5 %   Platelets 105 (L) 150 - 400 K/uL    Comment: REPEATED TO VERIFY SPECIMEN CHECKED FOR CLOTS PLATELET COUNT CONFIRMED BY SMEAR    Neutrophils Relative % 65 %   Neutro Abs 5.1 1.7 - 7.7 K/uL   Lymphocytes Relative 29 %   Lymphs Abs 2.2 0.7 - 4.0 K/uL   Monocytes Relative 6 %   Monocytes Absolute 0.4 0.1 - 1.0 K/uL   Eosinophils Relative 1 %   Eosinophils Absolute 0.0 0.0 - 0.7 K/uL   Basophils Relative 0 %   Basophils Absolute 0.0 0.0 - 0.1 K/uL  I-stat chem 8, ed     Status: Abnormal   Collection Time: 07/25/16  1:44 AM  Result Value Ref Range   Sodium 136 135 - 145 mmol/L   Potassium 5.9 (H) 3.5 - 5.1 mmol/L   Chloride 106 101 - 111 mmol/L   BUN 109 (H) 6 - 20 mg/dL   Creatinine, Ser 4.10 (H) 0.61 - 1.24 mg/dL   Glucose, Bld 202 (H) 65 - 99 mg/dL   Calcium, Ion 1.36 1.15 - 1.40 mmol/L   TCO2 23 0 - 100 mmol/L   Hemoglobin 10.9 (L) 13.0 - 17.0 g/dL   HCT 32.0 (L) 39.0 - 52.0 %  I-stat troponin, ED     Status: None   Collection Time: 07/25/16  2:09  AM  Result Value Ref Range   Troponin i, poc 0.01 0.00 -  0.08 ng/mL   Comment 3            Comment: Due to the release kinetics of cTnI, a negative result within the first hours of the onset of symptoms does not rule out myocardial infarction with certainty. If myocardial infarction is still suspected, repeat the test at appropriate intervals.    Ct Head Wo Contrast  Result Date: 07/25/2016 CLINICAL DATA:  Weakness.  Status post fall. EXAM: CT HEAD WITHOUT CONTRAST TECHNIQUE: Contiguous axial images were obtained from the base of the skull through the vertex without intravenous contrast. COMPARISON:  Head CT 10/07/2014 FINDINGS: Brain: No mass lesion, intraparenchymal hemorrhage or extra-axial collection. No evidence of acute cortical infarct. Brain parenchyma and CSF-containing spaces are normal for age. Vascular: No hyperdense vessel or unexpected calcification. Skull: Normal visualized skull base, calvarium and extracranial soft tissues. Sinuses/Orbits: No sinus fluid levels or advanced mucosal thickening. No mastoid effusion. Normal orbits. IMPRESSION: Normal head CT for age. Electronically Signed   By: Ulyses Jarred M.D.   On: 07/25/2016 02:29   Dg Chest Port 1 View  Result Date: 07/25/2016 CLINICAL DATA:  Weakness.  Fall tonight. EXAM: PORTABLE CHEST 1 VIEW COMPARISON:  07/18/2016 FINDINGS: Cardiac enlargement without vascular congestion. Small bilateral pleural effusions with basilar infiltration or atelectasis, greater on the right. No pneumothorax. Calcification of the aorta. Similar appearance to previous study. IMPRESSION: Bilateral pleural effusions with basilar atelectasis or infiltration, greater on the right. Electronically Signed   By: Lucienne Capers M.D.   On: 07/25/2016 02:01    Review Of Systems Unable to perform   Blood pressure (!) 69/52, pulse (!) 49, temperature (!) 95.8 F (35.4 C), temperature source Rectal, resp. rate 11, SpO2 93 %. There is no height or weight  on file to calculate BMI. General appearance: Resting comfortably and opens eyes to tapping shoulder Head: Normocephalic Eyes: pink conjunctivae/corneas clear.  Neck: no adenopathy, no carotid bruit, no JVD, supple, symmetrical, trachea midline and thyroid not enlarged. Resp: clear to auscultation bilaterally. Cardio: Paced. S1, S2 normal, no murmur, click, rub or gallop. GI: soft. Extremities: extremities normal, no cyanosis. 1 + edema. Skin: Warm and dry. No rashes or lesions Neurologic: Alert and oriented X 0, normal strength and tone.   Assessment/Plan Complete heart block on AV blocking medications. Cardiogenic shock COPD CKD, III DM II Paroxysmal atrial fibrillation Pulmonary hypertension  Transvenous temp. Pacemaker. Hold vasodepressor, lasix, doxazosin, metoprolol, diltiazem and amiodarone.   Birdie Riddle, MD  07/25/2016, 2:39 AM

## 2016-07-25 NOTE — ED Notes (Signed)
[name] at bedside, aware of patient's BP, gave verbal order for 1L NS.

## 2016-07-25 NOTE — ED Triage Notes (Signed)
Per [facility]: Pt to ED from home following near-syncopal episode, pt reports feeling lightheaded and fell about 1 hour ago. Some oral trauma noted (split lip), c-collar placed by EMS. Pt on beta-blockers, has COPD. Upon EMS arrival, HR 25, BP 120 palpated, pt gray and diaphoretic. Was given 0.5 Atropine, 2mg  glucagon (increased HR to 35-40), 4mg  zofran, 2g magnesium, 125mg  solumedrol, 10mg  albuterol, and 0.5mg  atrovent. Pt respirations labored with accessory muscle use and retractions. Patient initially A&O x 2, now is A&O x 4. Also given 1,25500ml NS en route.

## 2016-07-25 NOTE — ED Notes (Signed)
Patient returned to room, continuing to pace pt at 60ppm, 60mA. Patient remains A&O x 4. Cardiologist now at the bedside.

## 2016-07-25 NOTE — ED Notes (Signed)
Informed consent signed by patient's wife ([name] informed pt's wife of risks and benefits and wife has no further questions). Form at bedside.

## 2016-07-25 NOTE — Progress Notes (Signed)
TVP failed to capture, pt rate dropped 40-50s. External pacing initiated and MD called. Decision made to return with patient to cath lab and reposition leads. Family notified and will continue to update.

## 2016-07-25 NOTE — ED Notes (Signed)
Dopamine titrated up to 15 mcg/kg/min

## 2016-07-26 DIAGNOSIS — J810 Acute pulmonary edema: Secondary | ICD-10-CM

## 2016-07-26 DIAGNOSIS — Z515 Encounter for palliative care: Secondary | ICD-10-CM

## 2016-07-26 LAB — POCT I-STAT 3, ART BLOOD GAS (G3+)
Acid-Base Excess: 1 mmol/L (ref 0.0–2.0)
Bicarbonate: 26.6 mmol/L (ref 20.0–28.0)
O2 Saturation: 76 %
TCO2: 28 mmol/L (ref 0–100)
pCO2 arterial: 47.6 mmHg (ref 32.0–48.0)
pH, Arterial: 7.353 (ref 7.350–7.450)
pO2, Arterial: 42 mmHg — ABNORMAL LOW (ref 83.0–108.0)

## 2016-07-26 LAB — BASIC METABOLIC PANEL
ANION GAP: 14 (ref 5–15)
ANION GAP: 14 (ref 5–15)
BUN: 120 mg/dL — ABNORMAL HIGH (ref 6–20)
BUN: 134 mg/dL — ABNORMAL HIGH (ref 6–20)
CALCIUM: 8.7 mg/dL — AB (ref 8.9–10.3)
CALCIUM: 8.3 mg/dL — AB (ref 8.9–10.3)
CO2: 25 mmol/L (ref 22–32)
CO2: 27 mmol/L (ref 22–32)
CREATININE: 5.18 mg/dL — AB (ref 0.61–1.24)
Chloride: 98 mmol/L — ABNORMAL LOW (ref 101–111)
Chloride: 99 mmol/L — ABNORMAL LOW (ref 101–111)
Creatinine, Ser: 5.01 mg/dL — ABNORMAL HIGH (ref 0.61–1.24)
GFR, EST AFRICAN AMERICAN: 12 mL/min — AB (ref >60)
GFR, EST AFRICAN AMERICAN: 12 mL/min — AB (ref >60)
GFR, EST NON AFRICAN AMERICAN: 10 mL/min — AB (ref >60)
GFR, EST NON AFRICAN AMERICAN: 10 mL/min — AB (ref >60)
Glucose, Bld: 165 mg/dL — ABNORMAL HIGH (ref 65–99)
Glucose, Bld: 149 mg/dL — ABNORMAL HIGH (ref 65–99)
Potassium: 5.9 mmol/L — ABNORMAL HIGH (ref 3.5–5.1)
Potassium: 4.7 mmol/L (ref 3.5–5.1)
SODIUM: 140 mmol/L (ref 135–145)
Sodium: 137 mmol/L (ref 135–145)

## 2016-07-26 LAB — CBC
HEMATOCRIT: 29.9 % — AB (ref 39.0–52.0)
Hemoglobin: 9.8 g/dL — ABNORMAL LOW (ref 13.0–17.0)
MCH: 29.9 pg (ref 26.0–34.0)
MCHC: 32.8 g/dL (ref 30.0–36.0)
MCV: 91.2 fL (ref 78.0–100.0)
Platelets: 122 10*3/uL — ABNORMAL LOW (ref 150–400)
RBC: 3.28 MIL/uL — ABNORMAL LOW (ref 4.22–5.81)
RDW: 16.3 % — AB (ref 11.5–15.5)
WBC: 9.8 10*3/uL (ref 4.0–10.5)

## 2016-07-26 LAB — GLUCOSE, CAPILLARY
GLUCOSE-CAPILLARY: 144 mg/dL — AB (ref 65–99)
GLUCOSE-CAPILLARY: 150 mg/dL — AB (ref 65–99)
GLUCOSE-CAPILLARY: 131 mg/dL — AB (ref 65–99)
Glucose-Capillary: 165 mg/dL — ABNORMAL HIGH (ref 65–99)
Glucose-Capillary: 130 mg/dL — ABNORMAL HIGH (ref 65–99)

## 2016-07-26 LAB — MAGNESIUM: MAGNESIUM: 2.8 mg/dL — AB (ref 1.7–2.4)

## 2016-07-26 LAB — PHOSPHORUS: PHOSPHORUS: 6.6 mg/dL — AB (ref 2.5–4.6)

## 2016-07-26 MED ORDER — LORAZEPAM 0.5 MG PO TABS: 0.5000 mg | ORAL_TABLET | Freq: Four times a day (QID) | ORAL | Status: DC | PRN

## 2016-07-26 MED ORDER — GI COCKTAIL ~~LOC~~
30.0000 mL | Freq: Two times a day (BID) | ORAL | Status: DC | PRN
  Administered 2016-07-26 – 2016-07-27 (×2): 30 mL via ORAL
  Filled 2016-07-26 (×2): qty 30

## 2016-07-26 MED ORDER — ALUM & MAG HYDROXIDE-SIMETH 200-200-20 MG/5ML PO SUSP
15.0000 mL | ORAL | Status: DC | PRN
  Administered 2016-07-26: 15 mL via ORAL
  Filled 2016-07-26: qty 30

## 2016-07-26 MED ORDER — INSULIN ASPART 100 UNIT/ML ~~LOC~~ SOLN: 0.0000 [IU] | Freq: Every day | SUBCUTANEOUS | Status: DC

## 2016-07-26 MED ORDER — ALUM & MAG HYDROXIDE-SIMETH 200-200-20 MG/5ML PO SUSP
30.0000 mL | Freq: Four times a day (QID) | ORAL | Status: DC | PRN
  Administered 2016-07-26: 30 mL via ORAL
  Filled 2016-07-26: qty 30

## 2016-07-26 MED ORDER — INSULIN ASPART 100 UNIT/ML ~~LOC~~ SOLN
0.0000 [IU] | Freq: Three times a day (TID) | SUBCUTANEOUS | Status: DC
  Administered 2016-07-26: 2 [IU] via SUBCUTANEOUS
  Administered 2016-07-27: 3 [IU] via SUBCUTANEOUS

## 2016-07-26 MED ORDER — SODIUM POLYSTYRENE SULFONATE 15 GM/60ML PO SUSP
30.0000 g | Freq: Once | ORAL | Status: AC
  Administered 2016-07-26: 30 g via ORAL
  Filled 2016-07-26: qty 120

## 2016-07-26 NOTE — Progress Notes (Signed)
PULMONARY / CRITICAL CARE MEDICINE   Name: GABRIELE LOVELAND MRN: [id number] DOB: [birth year]    ADMISSION DATE:  07/25/2016 CONSULTATION DATE:  07/25/2016  REFERRING MD:  Dr. Algie Coffer, Cardiology  CHIEF COMPLAINT:  Fall, bradycardia  HISTORY OF PRESENT ILLNESS:  VENICE LIZ a 70 y.o.malewith medical history significant for abdominal aortic aneurysm status post resection and grafting in 2015, atrial fibrillation on xarelto, CKD stage IV with anemia, dyslipidemia, hypertension, COPD with bronchiectasis and history of recurrent right pleural effusion. He was recently admitted to [facility] for hypoxemic respiratory failure secondary to primarilyacute on chronic diastolic CHF, AF RVR,  and also COPD. Diuresis limited due to renal function and he has been told he is a poor candidate for dialysis. He underwent DCCV 11/4 with return of NSR. He was seen by palliative care and qualifies for hospice, but declined. Was made DNR during that admission. He was discharged on home O2 as palliative measure. He was discharged also on home cardiac meds including beta blocker. 11/8 he fell at home after feeling lightheaded prompting presentation to ED. Upon arrival he was found to be bradycardic (20s) and hypotensive.  He was given atropine and glucagon with little improvement. He was hyperkalemic and was given Calcium, insulin, and bicarb for this. 11/8 Temporary Transvenous pacer was placed with an initial rate of 80, currently backup rate is set at 50.. Cardiology and PCCM consulted.   SUBJECTIVE:  Had a couple runs of v-tach over night per RN, comfortable in bed. Not currently pacing  VITAL SIGNS: BP 115/64   Pulse 63   Temp 98.2 F (36.8 C) (Oral)   Resp 19   Ht  (1.676 m)   Wt 165 lb 2 oz (74.9 kg)   SpO2 97%   BMI 26.65 kg/m   HEMODYNAMICS:    VENTILATOR SETTINGS:    INTAKE / OUTPUT: I/O last 3 completed shifts: In: 3100 [P.O.:440; I.V.:1550; IV Piggyback:1110] Out: 350  [Urine:350]  PHYSICAL EXAMINATION: General:  Elderly male in NAD Neuro:  Alert, answers appropriately HEENT:  Normocephalic, no drainage from mouth, nose or ears.   Cardiovascular: HR 64No MRG, backup pacer at 50. Lungs:  Rhonchi bilaterally Abdomen:  Soft, non-tender, non-distended Musculoskeletal:  No acute deformity Skin:  Grossly intact, warm and dry.   LABS:  BMET  Recent Labs Lab 07/25/16 0533 07/25/16 2221 07/26/16 0345  NA 137 138 137  K 5.4* 5.9* 5.9*  CL 106 101 98*  CO2 21* 26 25  BUN 100* 112* 120*  CREATININE 4.44* 5.07* 5.18*  GLUCOSE 141* 121* 165*    Electrolytes  Recent Labs Lab 07/25/16 0533 07/25/16 2221 07/26/16 0345  CALCIUM 8.7* 8.8* 8.7*  MG  --   --  2.8*  PHOS  --   --  6.6*    CBC  Recent Labs Lab 07/25/16 0130 07/25/16 0144 07/25/16 0533 07/26/16 0345  WBC 7.8  --  12.0* 9.8  HGB 9.8* 10.9* 10.7* 9.8*  HCT 30.9* 32.0* 33.8* 29.9*  PLT 105*  --  159 122*    Coag's No results for input(s): APTT, INR in the last 168 hours.  Sepsis Markers  Recent Labs Lab 07/21/16 0429  PROCALCITON <0.10    ABG  Recent Labs Lab 07/25/16 0317  PHART 7.107*  PCO2ART 63.3*  PO2ART 45.0*    Liver Enzymes No results for input(s): AST, ALT, ALKPHOS, BILITOT, ALBUMIN in the last 168 hours.  Cardiac Enzymes No results for input(s): TROPONINI, PROBNP in the last  168 hours.  Glucose  Recent Labs Lab 07/25/16 0737 07/25/16 1154 07/25/16 1537 07/25/16 2042 07/25/16 2344 07/26/16 0405  GLUCAP 117* 119* 126* 132* 121* 165*    Imaging No results found.   STUDIES:    CULTURES:   ANTIBIOTICS:   SIGNIFICANT EVENTS: 11/6 - discharge 11/8 admit for complete heart block 11/8 - Transvenous temp pacemaker via right femoral vein(backup rate @ 50)  LINES/TUBES: 11/8 rt fem pacer wire>>  DISCUSSION: 70 year old male with extensive cardiac history presenting with complete heart block, temp pacer was placed on 11/8.   Renal failure.  Aneuric.  Hyperkalemic.  Was discharged home on hospice in past.   ASSESSMENT / PLAN:  PULMONARY A: Acute hypercarbic respiratory failure COPD without exacerbation  P:   DNI. Titrate O2 for sat of 88-92%. Duonebs and PRN albuterol. Repeat abg 11/9  CARDIOVASCULAR A:  Complete heart block - on amio, BB, CCB at home with renal failure. Also hyperkalemia H/o AF RVR s/p DCCV 11/4 Cardiogenic shock  P:  Transvenous temporary pacing (rate set at 50) Dopamine, epinephrine for HR, BP control - off for now Keep MAP > 65, HR > 50 Cardiology primary Holding outpatient meds CXR   RENAL  Recent Labs Lab 07/25/16 0533 07/25/16 2221 07/26/16 0345  K 5.4* 5.9* 5.9*     A:   CKD Hyperkalemia > temporized in ED  P:   Stopped 11/8 bicarb gtt for hyperK and acidosis Serial BMP Not a dialysis candidate Replace electrolytes as indicated Kayexalate   GASTROINTESTINAL A:   GERD  P:   Heart healthy carb modified diet Pepcid  HEMATOLOGIC A:   Anemia  P:  Follow CBC  INFECTIOUS A:   No acute issues  P:   No need for abx  ENDOCRINE A:   DM    P:   CBG monitoring and SSI  NEUROLOGIC A:   Acute metabolic encephalopathy P:   RASS goal: 0  FAMILY  - Updates: No family bedside, will likely need to hospice involvement.  - Inter-disciplinary family meet or Palliative Care meeting due by:  11/15  Brett CanalesSteve Minor ACNP [facility] PCCM Pager [phone] till 3 pm If no answer page [phone] 07/26/2016, 9:10 AM  Attending Note:  70 year old male with complete heart block with a temp pacer in place now presenting with renal failure and fluid overload.  Renal function is now deteriorating.  On exam, coarse BS diffusely.  CXR with pulmonary edema.  Patient will likely require dialysis but no indication for acute dialysis.  Contacted renal, will hold off on CRRT for now given the fact that patient is not a long term dialysis candidate.  If acid base or K  worsens in AM then will consider placement of trialysis cathter and beginning CRRT in AM with intention of only doing that for a week to optimize volume status then d/c, if fails then comfort.  Patient ok with plan and palliative care is following.  The patient is critically ill with multiple organ systems failure and requires high complexity decision making for assessment and support, frequent evaluation and titration of therapies, application of advanced monitoring technologies and extensive interpretation of multiple databases.   Critical Care Time devoted to patient care services described in this note is  35  Minutes. This time reflects time of care of this signee Dr Koren BoundWesam Dennys Traughber. This critical care time does not reflect procedure time, or teaching time or supervisory time of PA/NP/Med student/Med Resident etc but  could involve care discussion time.  Rush Farmer, M.D. [facility] Pulmonary/Critical Care Medicine. Pager: [phone]. After hours pager: [phone].

## 2016-07-26 NOTE — Progress Notes (Addendum)
Advanced Heart Failure Team Progress Note   HPI:    Readmitted early 07/25/16 after a syncopal event found with profound bradycardia/CHB and hypotension requiring emergent transvenous pacing by [name] and dopamine gtt. ABG wth pH 7.1 K 5.9. Given calcium and glucagon as well.   Pacer turned down yesterday with underlying NSR 50-60s.  Remains oliguric/anuric.  Bladder scan last night with >400 cc and 350 cc out with I/O foley. Feeling OK this am.  Denies SOB. Productive cough with brown sputum.   Only 350 cc of UO recorded. Creatinine 3.8 > 4.4  > 5.18. BUN 100 > 120  Home Medications Prior to Admission medications   Medication Sig Start Date End Date Taking? Authorizing Provider  albuterol (PROVENTIL HFA;VENTOLIN HFA) 108 (90 Base) MCG/ACT inhaler Inhale 1-2 puffs into the lungs every 4 (four) hours as needed for wheezing or shortness of breath.   Yes Historical Provider, MD  atorvastatin (LIPITOR) 10 MG tablet Take 10 mg by mouth at bedtime.    Yes Historical Provider, MD  budesonide-formoterol (SYMBICORT) 80-4.5 MCG/ACT inhaler Inhale 2 puffs into the lungs 2 (two) times daily. 06/05/16  Yes Nyoka CowdenMichael B Wert, MD  dextromethorphan-guaiFENesin [facility](MUCINEX DM) 30-600 MG 12hr tablet Take 1 tablet by mouth 2 (two) times daily as needed for cough.    Yes Historical Provider, MD  diltiazem (TIAZAC) 180 MG 24 hr capsule TAKE 2 CAPSULES BY MOUTH EVERY DAY 07/18/16  Yes Donita BrooksWarren T Pickard, MD  doxazosin (CARDURA) 4 MG tablet Take 1 tablet (4 mg total) by mouth daily. 05/30/16  Yes Donita BrooksWarren T Pickard, MD  flecainide (TAMBOCOR) 100 MG tablet Take 1 tablet (100 mg total) by mouth every 12 (twelve) hours. 07/23/16  Yes Tyrone Nineyan B Grunz, MD  furosemide (LASIX) 40 MG tablet Take 1 tablet (40 mg total) by mouth 2 (two) times daily. 07/23/16  Yes Tyrone Nineyan B Grunz, MD  metoprolol succinate (TOPROL-XL) 100 MG 24 hr tablet Take 1 tablet (100 mg total) by mouth daily. Take with or immediately following a meal. 04/06/16  Yes  Donita BrooksWarren T Pickard, MD  ranitidine (ZANTAC) 150 MG capsule Take 150 mg by mouth daily as needed for heartburn.   Yes Historical Provider, MD  Rivaroxaban (XARELTO) 15 MG TABS tablet Take 1 tablet (15 mg total) by mouth daily with supper. 01/10/16  Yes Donita BrooksWarren T Pickard, MD  simethicone (MYLICON) 80 MG chewable tablet Chew 80 mg by mouth every 6 (six) hours as needed for flatulence.   Yes Historical Provider, MD  OXYGEN Use 2 L at bedtime and as needed for shortness of breath and activity    Historical Provider, MD  VENTOLIN HFA 108 (90 Base) MCG/ACT inhaler INHALE 2 PUFFS INTO THE LUNGS EVERY 6 (SIX) HOURS AS NEEDED FOR WHEEZING OR SHORTNESS OF BREATH. Patient not taking: Reported on 07/25/2016 11/29/15   Donita BrooksWarren T Pickard, MD    Past Medical History: Past Medical History:  Diagnosis Date  . AAA (abdominal aortic aneurysm) (HCC)    a. 09/2013: Resection and grafting of abdominal aortic aneurysm with insertion of an aorto by common iliac graft using 18 x 9 mm Hemashield Dacron graft.  . Arthritis    "little in my hands" (09/08/2013)  . Chronic diastolic CHF (congestive heart failure) (HCC)    a.  Echo (08/13/2013): EF 55-60%, normal wall motion, mild MR, mild LAE  . Chronic kidney disease (CKD), stage III (moderate)   . COPD (chronic obstructive pulmonary disease) (HCC)   . Diabetes mellitus without  complication (HCC)    TYPE 2  . GERD (gastroesophageal reflux disease)   . H/O hiatal hernia   . Hx of cardiovascular stress test    a. Myoview 09/2012:  no scar or ischemia  . Hyperlipidemia   . Hypertension   . On home oxygen therapy    "2L @ night" (07/17/2016)  . PAF (paroxysmal atrial fibrillation) (HCC)    a. amiodarone Rx started 08/2013;  b. s/p TEE-DCCV 09/2013 => recurrent AFib => repeat DCCV => NSR;   c. Xarelto 15 QD due to CKD  . Pulmonary hypertension associated with unclear multi-factorial mechanisms    Only on echo (8/17).      Past Surgical History: Past Surgical History:    Procedure Laterality Date  . ABDOMINAL AORTIC ANEURYSM REPAIR  10/15/2012   Procedure: ANEURYSM ABDOMINAL AORTIC REPAIR;  Surgeon: Pryor Ochoa, MD;  Location: [facility] OR;  Service: Vascular;  Laterality: N/A;  Resection and Grafting of Abdominal Aortic Aneurysm - Aortobi-Iliac   . ABDOMINAL AORTIC ANEURYSM REPAIR  10-15-2012  . CARDIAC CATHETERIZATION N/A 07/25/2016   Procedure: Temporary Pacemaker;  Surgeon: Orpah Cobb, MD;  Location: [facility] INVASIVE CV LAB;  Service: Cardiovascular;  Laterality: N/A;  . CARDIAC CATHETERIZATION N/A 07/25/2016   Procedure: Temporary Pacemaker;  Surgeon: Orpah Cobb, MD;  Location: [facility] INVASIVE CV LAB;  Service: Cardiovascular;  Laterality: N/A;  . CARDIOVERSION N/A 09/22/2013   Procedure: CARDIOVERSION;  Surgeon: Lars Masson, MD;  Location: [facility] ENDOSCOPY;  Service: Cardiovascular;  Laterality: N/A;  . CARDIOVERSION  Jan. 12, 2015   Dr. Doylene Canning. Ladona Ridgel  . CARDIOVERSION N/A 09/28/2013   Procedure: CARDIOVERSION;  Surgeon: Marinus Maw, MD;  Location: [facility] CATH LAB;  Service: Cardiovascular;  Laterality: N/A;  . CARDIOVERSION N/A 06/04/2016   Procedure: CARDIOVERSION;  Surgeon: Lars Masson, MD;  Location: [facility] ENDOSCOPY;  Service: Cardiovascular;  Laterality: N/A;  . CARDIOVERSION N/A 07/21/2016   Procedure: CARDIOVERSION;  Surgeon: Dolores Patty, MD;  Location: [facility] OR;  Service: Cardiovascular;  Laterality: N/A;  . INGUINAL HERNIA REPAIR Right 12/22/2014   Procedure: OPEN RIGHT INGUINAL HERNIA REPAIR WITH MESH;  Surgeon: Axel Filler, MD;  Location: [facility] ORS;  Service: General;  Laterality: Right;  . INSERTION OF MESH N/A 12/22/2014   Procedure: INSERTION OF MESH;  Surgeon: Axel Filler, MD;  Location: [facility] ORS;  Service: General;  Laterality: N/A;  . KNEE ARTHROSCOPY Right   . TEE WITHOUT CARDIOVERSION N/A 09/22/2013   Procedure: TRANSESOPHAGEAL ECHOCARDIOGRAM (TEE);  Surgeon: Lars Masson, MD;  Location: [facility] ENDOSCOPY;  Service: Cardiovascular;  Laterality:  N/A;    Family History: Family History  Problem Relation Age of Onset  . Diabetes Mother   . Tuberculosis Mother   . Heart disease Father   . Heart attack Father   . Heart disease Son     Blood clot:  Arm    Social History: Social History   Social History  . Marital status: Married    Spouse name: Nicole Cella  . Number of children: 4  . Years of education: N/A   Occupational History  . Works PT as a Education administrator    Social History Main Topics  . Smoking status: Former Smoker    Packs/day: 1.00    Years: 56.00    Types: Cigarettes    Quit date: 07/31/2013  . Smokeless tobacco: Former Neurosurgeon    Types: Chew  . Alcohol use No  . Drug use: No  . Sexual activity: Not Currently  Other Topics Concern  . None   Social History Narrative   Married.  Lives with wife.  Ambulates without assistance.    Allergies:  Allergies  Allergen Reactions  . Niaspan [Niacin Er] Anaphylaxis  . Amiodarone     Stopped due to lung issues    Objective:    Vital Signs:   Temp:  [93 F (33.9 C)-99.2 F (37.3 C)] 98.2 F (36.8 C) (11/09 0357) Pulse Rate:  [49-80] 63 (11/09 0600) Resp:  [12-22] 19 (11/09 0600) BP: (96-128)/(62-85) 115/64 (11/09 0600) SpO2:  [89 %-100 %] 97 % (11/09 0600) Weight:  [165 lb 2 oz (74.9 kg)] 165 lb 2 oz (74.9 kg) (11/08 1400) Last BM Date:  (uta)  Weight change: Filed Weights   07/25/16 0449 07/25/16 1400  Weight: 159 lb 6.3 oz (72.3 kg) 165 lb 2 oz (74.9 kg)    Intake/Output:   Intake/Output Summary (Last 24 hours) at 07/26/16 0724 Last data filed at 07/25/16 2324  Gross per 24 hour  Intake             1665 ml  Output              350 ml  Net             1315 ml     Physical Exam: General:  Elderly and chronically ill appearing. NAD.  HEENT: Normal, but poor dentition.  Neck: supple. JVP elevated to ear . Carotids 2+ bilat; no bruits. No thyromegaly or nodule noted. Cor: PMI nondisplaced. Regular rate & rhythm. 2/6 TR. Lungs: Diminished BS  throughout no wheeze Abdomen: soft, NT, mild/mod distended, no HSM. No bruits or masses. +BS  Extremities: no cyanosis, clubbing, rash, 2-3+ edema. Temporary wire R groin with leg immobilizer Neuro: alert & orientedx3, cranial nerves grossly intact. moves all 4 extremities w/o difficulty. Affect pleasant  Telemetry: Reviewed, NSR 60s. Episodes of NSVT overnight, PVCs  Labs: Basic Metabolic Panel:  Recent Labs Lab 07/23/16 0928 07/25/16 0130 07/25/16 0144 07/25/16 0533 07/25/16 2221 07/26/16 0345  NA 138 135 136 137 138 137  K 4.7 5.9* 5.9* 5.4* 5.9* 5.9*  CL 105 106 106 106 101 98*  CO2 21* 22  --  21* 26 25  GLUCOSE 142* 215* 202* 141* 121* 165*  BUN 88* 102* 109* 100* 112* 120*  CREATININE 3.78* 4.35* 4.10* 4.44* 5.07* 5.18*  CALCIUM 8.7* 10.3  --  8.7* 8.8* 8.7*  MG  --   --   --   --   --  2.8*  PHOS  --   --   --   --   --  6.6*    Liver Function Tests: No results for input(s): AST, ALT, ALKPHOS, BILITOT, PROT, ALBUMIN in the last 168 hours. No results for input(s): LIPASE, AMYLASE in the last 168 hours. No results for input(s): AMMONIA in the last 168 hours.  CBC:  Recent Labs Lab 07/22/16 0309 07/23/16 0928 07/25/16 0130 07/25/16 0144 07/25/16 0533 07/26/16 0345  WBC 5.2 7.7 7.8  --  12.0* 9.8  NEUTROABS  --   --  5.1  --   --   --   HGB 10.5* 11.2* 9.8* 10.9* 10.7* 9.8*  HCT 32.5* 34.0* 30.9* 32.0* 33.8* 29.9*  MCV 89.8 90.4 92.2  --  92.6 91.2  PLT 135* 137* 105*  --  159 122*    Cardiac Enzymes: No results for input(s): CKTOTAL, CKMB, CKMBINDEX, TROPONINI in the last 168 hours.  BNP: BNP (last  3 results)  Recent Labs  07/17/16 1302  BNP 915.0*    ProBNP (last 3 results)  Recent Labs  05/08/16 1212  PROBNP 1,079.0*     CBG:  Recent Labs Lab 07/25/16 1154 07/25/16 1537 07/25/16 2042 07/25/16 2344 07/26/16 0405  GLUCAP 119* 126* 132* 121* 165*    Coagulation Studies: No results for input(s): LABPROT, INR in the last 72  hours.  Other results: EKG: CHB with junctional 20-30s  Imaging: Ct Head Wo Contrast  Result Date: 07/25/2016 CLINICAL DATA:  Weakness.  Status post fall. EXAM: CT HEAD WITHOUT CONTRAST TECHNIQUE: Contiguous axial images were obtained from the base of the skull through the vertex without intravenous contrast. COMPARISON:  Head CT 10/07/2014 FINDINGS: Brain: No mass lesion, intraparenchymal hemorrhage or extra-axial collection. No evidence of acute cortical infarct. Brain parenchyma and CSF-containing spaces are normal for age. Vascular: No hyperdense vessel or unexpected calcification. Skull: Normal visualized skull base, calvarium and extracranial soft tissues. Sinuses/Orbits: No sinus fluid levels or advanced mucosal thickening. No mastoid effusion. Normal orbits. IMPRESSION: Normal head CT for age. Electronically Signed   By: Deatra RobinsonKevin  Herman M.D.   On: 07/25/2016 02:29   Dg Chest Port 1 View  Result Date: 07/25/2016 CLINICAL DATA:  Weakness.  Fall tonight. EXAM: PORTABLE CHEST 1 VIEW COMPARISON:  07/18/2016 FINDINGS: Cardiac enlargement without vascular congestion. Small bilateral pleural effusions with basilar infiltration or atelectasis, greater on the right. No pneumothorax. Calcification of the aorta. Similar appearance to previous study. IMPRESSION: Bilateral pleural effusions with basilar atelectasis or infiltration, greater on the right. Electronically Signed   By: Burman NievesWilliam  Stevens M.D.   On: 07/25/2016 02:01     Medications:     Current Medications: . budesonide (PULMICORT) nebulizer solution  0.5 mg Nebulization BID  . chlorhexidine  15 mL Mouth Rinse BID  . insulin aspart  2-6 Units Subcutaneous Q4H  . ipratropium-albuterol  3 mL Nebulization Q6H  . mouth rinse  15 mL Mouth Rinse q12n4p  . sodium chloride flush  3 mL Intravenous Q12H    Infusions: . DOPamine 0 mcg/kg/min (07/25/16 0700)  . epinephrine Stopped (07/25/16 0445)     Assessment:   1. Syncope with CHB 2.  Hypotension/shock due to #1 3. PAFL s/p recent DC-CV 4. PAH with end-stage RHF 5. 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 COPD 7. Hyperkalemia  Plan/Discussion:    He is critically ill with worsening multi-system organ failure in the setting of end-stage RHF now c/b by an episode of CHB, hypotension and ARF.   Remains in NSR this am off of TVP.  Flecainide remains on hold.    Xarelto on hold with Renal function. Will discuss need for heparin with MD.   Cardiac issues very unstable.  K remains 5.9 and Mg 2.8 secondary to his renal failure. Pt has already received glucagon, calcium, and sodium bicarb. Got 30 g kayexalate yesterday. May need to repeat.   Renal function continues to worsen. Given dose of lasix yesterday with minimal to no response.   Overall, pt prognosis very poor with little chance of organ recovery this admission. He has agreed to speak with palliative care again this admission.  Pt continues to have poor insight into his prognosis.   Length of Stay: 1  Luane SchoolMichael Andrew Tillery, PA-C 07/26/2016, 7:24 AM  Advanced Heart Failure Team Pager [phone] (M-F; 7a - 4p)  Please contact [facility] for night-coverage after hours (4p -7a ) and weekends on [email or web address]  Patient seen and  examined with Otilio Saber, PA-C. We discussed all aspects of the encounter. I agree with the assessment and plan as stated above.   He remains critically ill. Renal function now worsening in the setting of very poor baseline renal function and now ATN. He is volume overloaded with hyperkalemia. Will consult Renal to see if he is candidate for CVVHD otherwise will need palliative care.  His rhythm is more stable off AVN blockers and flecainide. Will continue to hold for now. If recovers will consider restarting flecainide with low-dose (or now) CCB.   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  We have reconsulted Palliative Care.   The patient is critically ill with  multiple organ systems failure and requires high complexity decision making for assessment and support, frequent evaluation and titration of therapies, application of advanced monitoring technologies and extensive interpretation of multiple databases.   Critical Care Time devoted to patient care services described in this note is 35 Minutes.  Bensimhon, Daniel,MD 8:31 AM  Addendum:  I spoke to CCM and Renal this am as long as prolonged talk with patient and his family. As labs relatively stable and respiratory status ok, will hold off on CVVHD for one more day and see how he does.   Bensimhon, Daniel,MD 11:49 AM

## 2016-07-26 NOTE — Consult Note (Signed)
Consultation Note Date: 07/26/2016   Patient Name: Mark [name]  DOB: Oct 06, 1945  MRN: [id number]  Age / Sex: 70 y.o., male  PCP: Donita Brooks, MD Referring Physician: Dolores Patty, MD  Reason for Consultation: Establishing goals of care and Psychosocial/spiritual support  HPI/Patient Profile: 70 y.o. male admitted on 07/25/2016 witha medical history significant for abdominal aortic aneurysm status post resection and grafting in 2015, atrial fibrillation on Xarelto, tonic kidney disease stage IV, dyslipidemia, anemia likely related to chronic kidney disease, hypertension, COPD with bronchiectasis and history of recurrent right pleural effusion.   Continued physical and functional decline 2/2 to multiple co-morbidites. Limited medical interventions to prolong quality life, not a good dialysis candidate.  Patient and family now understand the limited prognosis and hope is to discharge home with hospice services   Clinical Assessment and Goals of Care:  This NP Lorinda Creed reviewed medical records, received report from team, assessed the patient and then meet at the patient's bedside along with his wife and two children and multiple other family members  to discuss diagnosis, prognosis, GOC, EOL wishes disposition and options.  A detailed discussion was had today regarding advanced directives.  Concepts specific to code status, artifical feeding and hydration, continued IV antibiotics and rehospitalization was had.  The difference between a aggressive medical intervention path  and a palliative comfort care path for this patient at this time was had.  Values and goals of care important to patient and family were attempted to be elicited.  Concept of Hospice and Palliative Care were discussed  Natural trajectory and expectations at EOL were discussed.  Questions and concerns addressed.   Family  encouraged to call with questions or concerns.  PMT will continue to support holistically.   SUMMARY OF RECOMMENDATIONS    -DNR/DNI - no further escalation of care but continue current interventions through the morning until logistics of disposition can be set up -plan is home with hospice, family is requesting [facility] - once home, shift to full comfort  Code Status/Advance Care Planning:  DNR-documented today   Symptom Management:   Anxiety: Ativan 0.5 mg po every 6 hrs prn  Dyspnea: discussed utilization of low dose opioids for pain and dyspnea, patient does not wish to utilize at this point in time, will defer to hospice once home  Palliative Prophylaxis:   Aspiration, Bowel Regimen, Delirium Protocol, Frequent Pain Assessment and Oral Care  Additional Recommendations (Limitations, Scope, Preferences):  - Once discharged home, full comfort measures, avoid rehospitalization   Psycho-social/Spiritual:   Desire for further Chaplaincy support:no  Additional Recommendations: Education on Hospice  Prognosis:   Less than one week  Discharge Planning: Home with Hospice      Primary Diagnoses: Present on Admission: . Complete heart block (HCC) . Atherosclerosis of native artery of extremity with intermittent claudication (HCC) . AAA (abdominal aortic aneurysm) (HCC) . HTN (hypertension) . HLD (hyperlipidemia) . Tobacco abuse . Hyperkalemia . COPD GOLD II with marked reversibility  . Protein calorie malnutrition (HCC) . CKD (  chronic kidney disease), stage IV (HCC) . PAF (paroxysmal atrial fibrillation) (HCC) . Anemia in CKD (chronic kidney disease) . PAH (pulmonary artery hypertension)   I have reviewed the medical record, interviewed the patient and family, and examined the patient. The following aspects are pertinent.  Past Medical History:  Diagnosis Date  . AAA (abdominal aortic aneurysm) (HCC)    a. 09/2013: Resection and grafting of abdominal aortic  aneurysm with insertion of an aorto by common iliac graft using 18 x 9 mm Hemashield Dacron graft.  . Arthritis    "little in my hands" (09/08/2013)  . Chronic diastolic CHF (congestive heart failure) (HCC)    a.  Echo (08/13/2013): EF 55-60%, normal wall motion, mild MR, mild LAE  . Chronic kidney disease (CKD), stage III (moderate)   . COPD (chronic obstructive pulmonary disease) (HCC)   . 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 GERD (gastroesophageal reflux disease)   . H/O hiatal hernia   . Hx of cardiovascular stress test    a. Myoview 09/2012:  no scar or ischemia  . Hyperlipidemia   . Hypertension   . On home oxygen therapy    "2L @ night" (07/17/2016)  . PAF (paroxysmal atrial fibrillation) (HCC)    a. amiodarone Rx started 08/2013;  b. s/p TEE-DCCV 09/2013 => recurrent AFib => repeat DCCV => NSR;   c. Xarelto 15 QD due to CKD  . Pulmonary hypertension associated with unclear multi-factorial mechanisms    Only on echo (8/17).     Social History   Social History  . Marital status: Married    Spouse name: [name]  . Number of children: 4  . Years of education: N/A   Occupational History  . Works PT as a Education administratorpainter    Social History Main Topics  . Smoking status: Former Smoker    Packs/day: 1.00    Years: 56.00    Types: Cigarettes    Quit date: 07/31/2013  . Smokeless tobacco: Former NeurosurgeonUser    Types: Chew  . Alcohol use No  . Drug use: No  . Sexual activity: Not Currently   Other Topics Concern  . None   Social History Narrative   Married.  Lives with wife.  Ambulates without assistance.   Family History  Problem Relation Age of Onset  . Diabetes Mother   . Tuberculosis Mother   . Heart disease Father   . Heart attack Father   . Heart disease Son     Blood clot:  Arm   Scheduled Meds: . budesonide (PULMICORT) nebulizer solution  0.5 mg Nebulization BID  . chlorhexidine  15 mL Mouth Rinse BID  . insulin aspart  2-6 Units Subcutaneous Q4H    . ipratropium-albuterol  3 mL Nebulization Q6H  . mouth rinse  15 mL Mouth Rinse q12n4p  . sodium chloride flush  3 mL Intravenous Q12H  . sodium polystyrene  30 g Oral Once   Continuous Infusions: . DOPamine 0 mcg/kg/min (07/25/16 0700)  . epinephrine Stopped (07/25/16 0445)   PRN Meds:.albuterol, guaiFENesin, ondansetron (ZOFRAN) IV Medications Prior to Admission:  Prior to Admission medications   Medication Sig Start Date End Date Taking? Authorizing Provider  albuterol (PROVENTIL HFA;VENTOLIN HFA) 108 (90 Base) MCG/ACT inhaler Inhale 1-2 puffs into the lungs every 4 (four) hours as needed for wheezing or shortness of breath.   Yes Historical Provider, MD  atorvastatin (LIPITOR) 10 MG tablet Take 10 mg by mouth at bedtime.  Yes Historical Provider, MD  budesonide-formoterol (SYMBICORT) 80-4.5 MCG/ACT inhaler Inhale 2 puffs into the lungs 2 (two) times daily. 06/05/16  Yes Nyoka CowdenMichael B Wert, MD  dextromethorphan-guaiFENesin [facility](MUCINEX DM) 30-600 MG 12hr tablet Take 1 tablet by mouth 2 (two) times daily as needed for cough.    Yes Historical Provider, MD  diltiazem (TIAZAC) 180 MG 24 hr capsule TAKE 2 CAPSULES BY MOUTH EVERY DAY 07/18/16  Yes Donita BrooksWarren T Pickard, MD  doxazosin (CARDURA) 4 MG tablet Take 1 tablet (4 mg total) by mouth daily. 05/30/16  Yes Donita BrooksWarren T Pickard, MD  flecainide (TAMBOCOR) 100 MG tablet Take 1 tablet (100 mg total) by mouth every 12 (twelve) hours. 07/23/16  Yes Tyrone Nineyan B Grunz, MD  furosemide (LASIX) 40 MG tablet Take 1 tablet (40 mg total) by mouth 2 (two) times daily. 07/23/16  Yes Tyrone Nineyan B Grunz, MD  metoprolol succinate (TOPROL-XL) 100 MG 24 hr tablet Take 1 tablet (100 mg total) by mouth daily. Take with or immediately following a meal. 04/06/16  Yes Donita BrooksWarren T Pickard, MD  ranitidine (ZANTAC) 150 MG capsule Take 150 mg by mouth daily as needed for heartburn.   Yes Historical Provider, MD  Rivaroxaban (XARELTO) 15 MG TABS tablet Take 1 tablet (15 mg total) by mouth daily with  supper. 01/10/16  Yes Donita BrooksWarren T Pickard, MD  simethicone (MYLICON) 80 MG chewable tablet Chew 80 mg by mouth every 6 (six) hours as needed for flatulence.   Yes Historical Provider, MD  OXYGEN Use 2 L at bedtime and as needed for shortness of breath and activity    Historical Provider, MD  VENTOLIN HFA 108 (90 Base) MCG/ACT inhaler INHALE 2 PUFFS INTO THE LUNGS EVERY 6 (SIX) HOURS AS NEEDED FOR WHEEZING OR SHORTNESS OF BREATH. Patient not taking: Reported on 07/25/2016 11/29/15   Donita BrooksWarren T Pickard, MD   Allergies  Allergen Reactions  . Niaspan [Niacin Er] Anaphylaxis  . Amiodarone     Stopped due to lung issues   Review of Systems  Constitutional: Positive for fatigue.  Neurological: Positive for weakness.    Physical Exam  Constitutional: He appears lethargic. He appears cachectic. He appears ill. Nasal cannula in place.  HENT:  Mouth/Throat: Mucous membranes are dry.  Cardiovascular: Normal rate, regular rhythm and normal heart sounds.   Musculoskeletal:  -generalized weakness  Neurological: He appears lethargic.    Vital Signs: BP (!) 145/76   Pulse 64   Temp 98 F (36.7 C) (Oral)   Resp 15   Ht 5\' 6"  (1.676 m)   Wt 74.9 kg (165 lb 2 oz)   SpO2 100%   BMI 26.65 kg/m  Pain Assessment: No/denies pain   Pain Score: 0-No pain   SpO2: SpO2: 100 % O2 Device:SpO2: 100 % O2 Flow Rate: .O2 Flow Rate (L/min): 4 L/min  IO: Intake/output summary:  Intake/Output Summary (Last 24 hours) at 07/26/16 1029 Last data filed at 07/26/16 0800  Gross per 24 hour  Intake             1340 ml  Output              450 ml  Net              890 ml    LBM: Last BM Date: 07/26/16 Baseline Weight: Weight: 72.3 kg (159 lb 6.3 oz) Most recent weight: Weight: 74.9 kg (165 lb 2 oz)      Palliative Assessment/Data: 30%   Discussed with Otilio SaberAndy Tillery PA-C  Time In: 1300 Time Out: 1700 Time Total: 90  min Greater than 50%  of this time was spent counseling and coordinating care related to  the above assessment and plan.  Signed by: Lorinda Creed, NP   Please contact Palliative Medicine Team phone at [phone] for questions and concerns.  For individual provider: See Loretha Stapler

## 2016-07-26 NOTE — Progress Notes (Signed)
Reported ABG results to Dr Molli KnockYacoub.  Syringe pumped blood/filled syring quickly and appeared arterial in color.  Sat 95% on monitor.  No distress noted.  VSS  Per MD, no need to re-collect d/t pH, pCO2 acceptable.  RN aware.

## 2016-07-26 NOTE — Progress Notes (Addendum)
[facility] Rounding Note  Subjective/Interval History:   Pt just discharged (10/31-11/6 with dCHF, severe COPD on home O2, AFF, CKD4/5, pulm HTN) - admitted on that occasion for worsening SOB/recurrent AFF - had cardioversion. Seen by us for worsening AKI on CKD with creatinine increase 2.98 on admission, peaked at  4.14, down to 3.78 at discharge with normal K 4.7, CO2 21. He was primarily seen by Dr. Allena KatzPatel from Nephrology who had extensive discussions with pt, wife and gradddaughter regarding the overwhelmingly high 1 year mortality if he were to start dialysis with his burden of comorbidities and had also informed him that he would recommend a palliative care track rather than aggressive intervention with RRT.   Pt returns just 2 days later with syncopal event/profound bradycardia/CHB/hypotension. Creatinine 4.3 on admisison. Treated with pacing/dopamine. PH was 7.1 given bicarb. Not making a lot of urine,  BUN/creatinine up to 120/5.18 today,  and we are asked to see again.  I reiterated to Dr. Teressa LowerBensimohn our feelings from his last admission regarding his extremely poor candidacy for dialysis and that this had been shared with patient.  He advised me that "patient was willing to consider dialysis".  Patient himself seems to have limited understanding of the issue. He endorses that Dr. Allena KatzPatel told him that "his body was too weak for dialysis", then in the same breath asks "when are putting me on dialysis". Long discussion with patient, son, daughter, granddaughter, wife - wife says "it's his decision".  Objective Vital signs in last 24 hours: Vitals:   07/26/16 0818 07/26/16 0900 07/26/16 1000 07/26/16 1100  BP:  (!) 145/76 (!) 141/104 (!) 147/90  Pulse:  64 67 88  Resp:  15 (!) 21 (!) 21  Temp:      TempSrc:      SpO2: 100% 100% 100% 100%  Weight:      Height:       Weight change: 2.6 kg (5 lb 11.7 oz)  Intake/Output Summary (Last 24 hours) at 07/26/16 1152 Last data filed at 07/26/16 0800   Gross per 24 hour  Intake             1185 ml  Output              450 ml  Net              735 ml   Physical Exam:  Blood pressure (!) 147/90, pulse 88, temperature 98 F (36.7 C), temperature source Oral, resp. rate (!) 21, height 5\' 6"  (1.676 m), weight 74.9 kg (165 lb 2 oz), SpO2 100 %.  Frail older WM Coughing VS as noted Diffuse coarse crackles W1S2 No S3 Heart sounds obscured by resp noise Abd soft no focal tenderness Foley catheter Pacer wire R groin 2-3+ LE edema  Labs: Basic Metabolic Panel:  Recent Labs Lab 07/21/16 0429 07/22/16 0309 07/23/16 0928 07/25/16 0130 07/25/16 0144 07/25/16 0533 07/25/16 2221 07/26/16 0345  NA 133* 139 138 135 136 137 138 137  K 4.6 4.3 4.7 5.9* 5.9* 5.4* 5.9* 5.9*  CL 104 105 105 106 106 106 101 98*  CO2 23 24 21* 22  --  21* 26 25  GLUCOSE 119* 176* 142* 215* 202* 141* 121* 165*  BUN 87* 88* 88* 102* 109* 100* 112* 120*  CREATININE 3.90* 3.81* 3.78* 4.35* 4.10* 4.44* 5.07* 5.18*  CALCIUM 8.4* 8.4* 8.7* 10.3  --  8.7* 8.8* 8.7*  PHOS  --   --   --   --   --   --   --  6.6*     Recent Labs Lab 07/23/16 0928 07/25/16 0130 07/25/16 0144 07/25/16 0533 07/26/16 0345  WBC 7.7 7.8  --  12.0* 9.8  NEUTROABS  --  5.1  --   --   --   HGB 11.2* 9.8* 10.9* 10.7* 9.8*  HCT 34.0* 30.9* 32.0* 33.8* 29.9*  MCV 90.4 92.2  --  92.6 91.2  PLT 137* 105*  --  159 122*     Recent Labs Lab 07/25/16 1537 07/25/16 2042 07/25/16 2344 07/26/16 0405 07/26/16 0808  GLUCAP 126* 132* 121* 165* 144*    Studies/Results: Ct Head Wo Contrast  Result Date: 07/25/2016 CLINICAL DATA:  Weakness.  Status post fall. EXAM: CT HEAD WITHOUT CONTRAST TECHNIQUE: Contiguous axial images were obtained from the base of the skull through the vertex without intravenous contrast. COMPARISON:  Head CT 10/07/2014 FINDINGS: Brain: No mass lesion, intraparenchymal hemorrhage or extra-axial collection. No evidence of acute cortical infarct. Brain parenchyma and  CSF-containing spaces are normal for age. Vascular: No hyperdense vessel or unexpected calcification. Skull: Normal visualized skull base, calvarium and extracranial soft tissues. Sinuses/Orbits: No sinus fluid levels or advanced mucosal thickening. No mastoid effusion. Normal orbits. IMPRESSION: Normal head CT for age. Electronically Signed   By: Deatra Robinson M.D.   On: 07/25/2016 02:29   Dg Chest Port 1 View  Result Date: 07/25/2016 CLINICAL DATA:  Weakness.  Fall tonight. EXAM: PORTABLE CHEST 1 VIEW COMPARISON:  07/18/2016 FINDINGS: Cardiac enlargement without vascular congestion. Small bilateral pleural effusions with basilar infiltration or atelectasis, greater on the right. No pneumothorax. Calcification of the aorta. Similar appearance to previous study. IMPRESSION: Bilateral pleural effusions with basilar atelectasis or infiltration, greater on the right. Electronically Signed   By: Burman Nieves M.D.   On: 07/25/2016 02:01   Medications: . DOPamine 0 mcg/kg/min (07/25/16 0700)  . epinephrine Stopped (07/25/16 0445)   . budesonide (PULMICORT) nebulizer solution  0.5 mg Nebulization BID  . chlorhexidine  15 mL Mouth Rinse BID  . ipratropium-albuterol  3 mL Nebulization Q6H  . mouth rinse  15 mL Mouth Rinse q12n4p  . sodium chloride flush  3 mL Intravenous Q12H  . sodium polystyrene  30 g Oral Once    Assessment/Recommendations  1. Acute kidney injury (recurrent) on CKD4/5. With chronic cardiorenal syndrome. Now with yet another renal insult related to CHB/bradycardia/hypotension with associated hyperkalemia (getting kayexalate) and metabolic acidosis (better after bicarb). He is a very poor candidate (ostensibly a non-candidate) for long term HD. Although I am very loathe to consider it,  I endorse that this is yet again acute on chronic and perhaps interval CRRT could be done until we see if gets any appreciable renal  recovery (which in my mind is unlikely).  And must keep in mind  that sometimes starting a heroic intervention and then having to make the decision to back off is harder than not ever starting... On his last admission Dr. Allena Katz discussed with him the overwhelmingly high 1 year mortality if he were to start dialysis with his burden of comorbidities- and also informed him that he would recommend a palliative care track rather than aggressive intervention with RRT.  After discussion with Dr. Teressa Lower, will try to treat his K conservatively, not pull the CRRT trigger just yet.    2. Hyperkalemia: Kayexalate just given. Repeat BMet in 2 hours.   3. Metabolic acidosis - looking better after bicarb  4. Bradycardia/CHB/hypotension - per cards  5. PAH/end stage right heart failure  6. Severe COPD  7. Disposition - This man's overall prognosis is very poor secondary to his cardiopulmonary and renal issues - and I feel that a palliative approach is the most appropriate. Long term dialysis not an option, but as per the above discussion, will further d/w Dr. Teressa LowerBensimohn whether intervening with CRRT for a short stent is even reasonable because endpoint would not be clear. Patient seems to have very little insight into this decision making, family (son, daughter, granddaughter, wife) seem to have better grasp).    Camille Balynthia Sharvi Mooneyhan, MD [facility] [phone] pager 07/26/2016, 11:52 AM

## 2016-07-27 ENCOUNTER — Inpatient Hospital Stay: Payer: PPO | Admitting: Family Medicine

## 2016-07-27 DIAGNOSIS — E441 Mild protein-calorie malnutrition: Secondary | ICD-10-CM

## 2016-07-27 LAB — RENAL FUNCTION PANEL
Albumin: 3.2 g/dL — ABNORMAL LOW (ref 3.5–5.0)
Anion gap: 11 (ref 5–15)
BUN: 142 mg/dL — ABNORMAL HIGH (ref 6–20)
CO2: 27 mmol/L (ref 22–32)
Calcium: 8.1 mg/dL — ABNORMAL LOW (ref 8.9–10.3)
Chloride: 104 mmol/L (ref 101–111)
Creatinine, Ser: 4.69 mg/dL — ABNORMAL HIGH (ref 0.61–1.24)
GFR calc Af Amer: 13 mL/min — ABNORMAL LOW (ref >60)
GFR calc non Af Amer: 11 mL/min — ABNORMAL LOW (ref >60)
Glucose, Bld: 149 mg/dL — ABNORMAL HIGH (ref 65–99)
Phosphorus: 4.6 mg/dL (ref 2.5–4.6)
Potassium: 4.4 mmol/L (ref 3.5–5.1)
Sodium: 142 mmol/L (ref 135–145)

## 2016-07-27 LAB — GLUCOSE, CAPILLARY
GLUCOSE-CAPILLARY: 152 mg/dL — AB (ref 65–99)
Glucose-Capillary: 119 mg/dL — ABNORMAL HIGH (ref 65–99)

## 2016-07-27 MED ORDER — ASPIRIN 81 MG PO CHEW
81.0000 mg | CHEWABLE_TABLET | ORAL | Status: DC
Start: 2016-07-28 — End: 2016-07-27

## 2016-07-27 MED ORDER — OXYCODONE HCL 20 MG/ML PO CONC
5.0000 mg | ORAL | Status: DC | PRN
Start: 2016-07-27 — End: 2016-07-27

## 2016-07-27 MED ORDER — HALOPERIDOL LACTATE 2 MG/ML PO CONC: 0.6000 mg | ORAL | Status: DC | PRN

## 2016-07-27 MED ORDER — LORAZEPAM 2 MG/ML PO CONC: 0.5000 mg | ORAL | Status: DC | PRN

## 2016-07-27 MED ORDER — HALOPERIDOL LACTATE 2 MG/ML PO CONC: 0.6000 mg | ORAL | 0 refills | Status: AC | PRN

## 2016-07-27 MED ORDER — ATROPINE SULFATE 1 % OP SOLN: 2.0000 [drp] | OPHTHALMIC | Status: DC | PRN

## 2016-07-27 MED ORDER — ATROPINE SULFATE 1 % OP SOLN: 2.0000 [drp] | OPHTHALMIC | 0 refills | Status: AC | PRN

## 2016-07-27 MED ORDER — DILTIAZEM HCL ER COATED BEADS 120 MG PO CP24: 120.0000 mg | ORAL_CAPSULE | Freq: Every day | ORAL | 3 refills | Status: AC

## 2016-07-27 MED ORDER — OXYCODONE HCL 20 MG/ML PO CONC: 5.0000 mg | ORAL | 0 refills | Status: AC | PRN

## 2016-07-27 MED ORDER — LORAZEPAM 2 MG/ML PO CONC: 0.6000 mg | ORAL | 0 refills | Status: AC | PRN

## 2016-07-27 MED ORDER — SENNA 8.6 MG PO TABS: 1.0000 | ORAL_TABLET | Freq: Two times a day (BID) | ORAL | 0 refills | Status: AC

## 2016-07-27 MED ORDER — SENNA 8.6 MG PO TABS: 1.0000 | ORAL_TABLET | Freq: Two times a day (BID) | ORAL | Status: DC

## 2016-07-27 MED ORDER — FUROSEMIDE 40 MG PO TABS: 80.0000 mg | ORAL_TABLET | Freq: Two times a day (BID) | ORAL | 6 refills | Status: AC

## 2016-07-27 MED ORDER — DILTIAZEM HCL ER COATED BEADS 120 MG PO CP24
120.0000 mg | ORAL_CAPSULE | Freq: Every day | ORAL | Status: DC
  Administered 2016-07-27: 120 mg via ORAL
  Filled 2016-07-27: qty 1

## 2016-07-27 NOTE — Progress Notes (Signed)
PULMONARY / CRITICAL CARE MEDICINE   Name: Mark [name] MRN: [id number] DOB: [birth year]    ADMISSION DATE:  07/25/2016 CONSULTATION DATE:  07/25/2016  REFERRING MD:  [name], Cardiology  CHIEF COMPLAINT:  Fall, bradycardia  HISTORY OF PRESENT ILLNESS:  Mark ProctorHorace T Medleyis a 70 y.o.malewith medical history significant for abdominal aortic aneurysm status post resection and grafting in 2015, atrial fibrillation on xarelto, CKD stage IV with anemia, dyslipidemia, hypertension, COPD with bronchiectasis and history of recurrent right pleural effusion. [facility] for hypoxemic respiratory failure secondary to primarilyacute on chronic diastolic CHF, AF RVR,  and also COPD. Diuresis limited due to renal function and he has been told he is a poor candidate for dialysis. He underwent DCCV 11/4 with return of NSR. He was seen by palliative care and qualifies for hospice, but declined. Was made DNR during that admission. He was discharged on home O2 as palliative measure. He was discharged also on home cardiac meds including beta blocker. 11/8 he fell at home after feeling lightheaded prompting presentation to ED. Upon arrival he was found to be bradycardic (20s) and hypotensive.  He was given atropine and glucagon with little improvement. He was hyperkalemic and was given Calcium, insulin, and bicarb for this. 11/8 Temporary Transvenous pacer was placed with an initial rate of 80, currently backup rate is set at 50.. Cardiology and PCCM consulted.   SUBJECTIVE:  No events overnight.  VITAL SIGNS: BP (!) 156/100   Pulse (!) 114   Temp 97.7 F (36.5 C) (Oral)   Resp 16   Ht 5\' 6"  (1.676 m)   Wt 74.9 kg (165 lb 2 oz)   SpO2 100%   BMI 26.65 kg/m   HEMODYNAMICS:    VENTILATOR SETTINGS:    INTAKE / OUTPUT: I/O last 3 completed shifts: In: 663 [P.O.:360; I.V.:303] Out: 1640 [Urine:1640]  PHYSICAL EXAMINATION: General:  Elderly male in NAD Neuro:  Alert,  answers appropriately HEENT:  Normocephalic, no drainage from mouth, nose or ears.   Cardiovascular: HR 64No MRG, backup pacer at 50. Lungs:  Rhonchi bilaterally Abdomen:  Soft, non-tender, non-distended Musculoskeletal:  No acute deformity Skin:  Grossly intact, warm and dry.   LABS:  BMET  Recent Labs Lab 07/26/16 0345 07/26/16 1600 07/27/16 0210  NA 137 140 142  K 5.9* 4.7 4.4  CL 98* 99* 104  CO2 25 27 27   BUN 120* 134* 142*  CREATININE 5.18* 5.01* 4.69*  GLUCOSE 165* 149* 149*    Electrolytes  Recent Labs Lab 07/26/16 0345 07/26/16 1600 07/27/16 0210  CALCIUM 8.7* 8.3* 8.1*  MG 2.8*  --   --   PHOS 6.6*  --  4.6    CBC  Recent Labs Lab 07/25/16 0130 07/25/16 0144 07/25/16 0533 07/26/16 0345  WBC 7.8  --  12.0* 9.8  HGB 9.8* 10.9* 10.7* 9.8*  HCT 30.9* 32.0* 33.8* 29.9*  PLT 105*  --  159 122*    Coag's No results for input(s): APTT, INR in the last 168 hours.  Sepsis Markers  Recent Labs Lab 07/21/16 0429  PROCALCITON <0.10    ABG  Recent Labs Lab 07/25/16 0317 07/26/16 0943  PHART 7.107* 7.353  PCO2ART 63.3* 47.6  PO2ART 45.0* 42.0*    Liver Enzymes  Recent Labs Lab 07/27/16 0210  ALBUMIN 3.2*    Cardiac Enzymes No results for input(s): TROPONINI, PROBNP in the last 168 hours.  Glucose  Recent Labs Lab 07/26/16 0405 07/26/16 0808 07/26/16  1236 07/26/16 1704 07/26/16 2107 07/27/16 0811  GLUCAP 165* 144* 130* 150* 131* 152*    Imaging No results found.   STUDIES:    CULTURES:   ANTIBIOTICS:   SIGNIFICANT EVENTS: 11/6 - discharge 11/8 admit for complete heart block 11/8 - Transvenous temp pacemaker via right femoral vein(backup rate @ 50)  LINES/TUBES: 11/8 rt fem pacer wire>>11/10  I reviewed CXR myself, pulmonary edema noted.  DISCUSSION: 70 year old male with extensive cardiac history presenting with complete heart block, temp pacer was placed on 11/8.  Renal failure.  Aneuric.   Hyperkalemic.  Was discharged home on hospice in past.   ASSESSMENT / PLAN:  PULMONARY A: Acute hypercarbic respiratory failure COPD without exacerbation  P:   DNI. O2 for comfort only at this point Duonebs and PRN albuterol.  CARDIOVASCULAR A:  Complete heart block - on amio, BB, CCB at home with renal failure. Also hyperkalemia H/o AF RVR s/p DCCV 11/4 Cardiogenic shock  P:  Transvenous temporary pacing (rate set at 50), remove today D/C pressors May resume outpatient meds for comfort purposes only. D/C further tele monitoring.  RENAL  Recent Labs Lab 07/26/16 0345 07/26/16 1600 07/27/16 0210  K 5.9* 4.7 4.4   A:   CKD Hyperkalemia > temporized in ED  P:   D/C further blood draws KVO IVF til discharge  GASTROINTESTINAL A:   GERD  P:   Liberalize diet  HEMATOLOGIC A:   Anemia  P:  Follow CBC  INFECTIOUS A:   No acute issues  P:   No need for abx  ENDOCRINE A:   DM    P:   CBG monitoring and SSI  NEUROLOGIC A:   Acute metabolic encephalopathy P:   RASS goal: 0  Dopamine is off, patient going home with hospice today, PCCM will sign off.  Discussed with bedside RN  Alyson ReedyWesam G. Yacoub, M.D. [facility]. Pager: [phone]. After hours pager: [phone].

## 2016-07-27 NOTE — Discharge Summary (Signed)
Advanced Heart Failure Discharge Note   Discharge Summary   Patient ID: Mark [name] MRN: [id number], DOB/AGE: 05-14-1946 [age] y.o. Admit date: 07/25/2016 D/C date:     07/27/2016   Primary Discharge Diagnoses:   1. Syncope with CHB 2. Hypotension/shock due to #1 3. PAFL s/p recent DC-CV 4. PAH with end-stage RHF 5. 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 COPD 7. Hyperkalemia  Consults - Renal 07/26/16 - EP 07/25/16 - PCCM 07/25/16  Hospital Course:   Shelbie ProctorHorace T Medleyis a 70 y.o.malechronic diastolic heart failure, severe COPD on home O2, atrial fibrillation/flutter (previously on amiodarone but d/c'd 2/2 lung issues), CKD stage IV, AAA s/p repair, and pulmonary hypertension. He has tolerated amiodarone for rhythm control until recent when amio stopped for concerns from pulmonary standpoint.   Recently hospitilized for > 2 weeks for recurrent dyspnea and A/CKD in setting of end stage RHF and recurrent AF/AFL.  Pt underwent palliative DC-CV and started on flecainide as he was not candidate for Ablation. Discharged 07/23/16 on diltiazem, toprol and flecainide.   [facility], found to be in CHB requiring TVP wire. Meds held including flecainide, toprol, and diltiazem.  Treated for hyperkalemia with calcium, glucagon, and kayexalate x 2 with improvement. Pacer turned down evening of 07/25/16 with underlying NSR in 50-60s. Thought to be poor candidate for pacemaker with poor prognosis and worsening renal function.  Thought to be poor candidate for dialysis, including CVVHD with RV failure and limited prognosis.   Long discussions with palliative care, continued from previous admission, ended with family and patient requesting to be sent home with hospice, with the knowledge that he likely had less than several weeks to live, possibly a matter of days. Pt and family request no escalation of care. Appreciate palliative assistance and comfort med recommendations     Pt noted to be back in afib 07/26/16. Toprol left off with CHB and flecainide stopped with marked renal dysfunction. Diltiazem decreased for home as palliative measure. TVP wire removed prior to d/c. Xarelto not resumed with extremely limited prognosis.   Pt will be discharged in tenuous condition with Home Hospice.  Expected prognosis < 2 weeks. [facility] or similar.  Transported via ambulance. Comfort meds ordered as recommended by Palliative care team.  Follow up can be scheduled as needed, but suspect will not be necessary with limited prognosis. Pt and family focus on keeping patient at home.   Discharge Weight Range: 165 lbs Discharge Vitals: Blood pressure (!) 150/107, pulse (!) 122, temperature 97.7 F (36.5 C), temperature source Oral, resp. rate 17, height 5\' 6"  (1.676 m), weight 165 lb 2 oz (74.9 kg), SpO2 99 %.  Labs: Lab Results  Component Value Date   WBC 9.8 07/26/2016   HGB 9.8 (L) 07/26/2016   HCT 29.9 (L) 07/26/2016   MCV 91.2 07/26/2016   PLT 122 (L) 07/26/2016     Recent Labs Lab 07/27/16 0210  NA 142  K 4.4  CL 104  CO2 27  BUN 142*  CREATININE 4.69*  CALCIUM 8.1*  GLUCOSE 149*   Lab Results  Component Value Date   CHOL 123 (L) 04/20/2016   HDL 44 04/20/2016   LDLCALC 56 04/20/2016   TRIG 113 04/20/2016   BNP (last 3 results)  Recent Labs  07/17/16 1302  BNP 915.0*    ProBNP (last 3 results)  Recent Labs  05/08/16 1212  PROBNP 1,079.0*    Discharge Medications  Medication List    STOP taking these medications   atorvastatin 10 MG tablet Commonly known as:  LIPITOR   diltiazem 180 MG 24 hr capsule Commonly known as:  TIAZAC   doxazosin 4 MG tablet Commonly known as:  CARDURA   flecainide 100 MG tablet Commonly known as:  TAMBOCOR   metoprolol succinate 100 MG 24 hr tablet Commonly known as:  TOPROL-XL   ranitidine 150 MG capsule Commonly known as:  ZANTAC   Rivaroxaban 15 MG Tabs  tablet Commonly known as:  XARELTO     TAKE these medications   albuterol 108 (90 Base) MCG/ACT inhaler Commonly known as:  PROVENTIL HFA;VENTOLIN HFA Inhale 1-2 puffs into the lungs every 4 (four) hours as needed for wheezing or shortness of breath. What changed:  Another medication with the same name was removed. Continue taking this medication, and follow the directions you see here.   atropine 1 % ophthalmic solution Place 2 drops under the tongue every 4 (four) hours as needed (for secretions).   budesonide-formoterol 80-4.5 MCG/ACT inhaler Commonly known as:  SYMBICORT Inhale 2 puffs into the lungs 2 (two) times daily.   dextromethorphan-guaiFENesin 30-600 MG 12hr tablet Commonly known as:  MUCINEX DM Take 1 tablet by mouth 2 (two) times daily as needed for cough.   diltiazem 120 MG 24 hr capsule Commonly known as:  CARDIZEM CD Take 1 capsule (120 mg total) by mouth daily.   furosemide 40 MG tablet Commonly known as:  LASIX Take 2 tablets (80 mg total) by mouth 2 (two) times daily. What changed:  how much to take   haloperidol 2 MG/ML solution Commonly known as:  HALDOL Take 0.3 mLs (0.6 mg total) by mouth every 2 (two) hours as needed for agitation.   LORazepam 2 MG/ML concentrated solution Commonly known as:  ATIVAN Place 0.3 mLs (0.6 mg total) under the tongue every 2 (two) hours as needed (Agitation).   oxyCODONE 20 MG/ML concentrated solution Commonly known as:  ROXICODONE INTENSOL Take 0.3 mLs (6 mg total) by mouth every 4 (four) hours as needed for severe pain (or shortness of breath).   OXYGEN Use 2 L at bedtime and as needed for shortness of breath and activity   senna 8.6 MG Tabs tablet Commonly known as:  SENOKOT Take 1-2 tablets (8.6-17.2 mg total) by mouth 2 (two) times daily.   simethicone 80 MG chewable tablet Commonly known as:  MYLICON Chew 80 mg by mouth every 6 (six) hours as needed for flatulence.       Disposition   The patient will  be discharged in very tenuous condition to home with Hospice Services in place.  Suspect prognosis days to weeks.   Discharge Instructions    Diet - low sodium heart healthy    Complete by:  As directed      Follow-up Information    Bensimhon, Daniel, MD Follow up.   Specialty:  Cardiology Why:  Hospice can contact as needed for diuretic adjustments and/or appointment. Otherwise goal is to remain at home.  Contact information: [address] [place] [address] [phone]             Duration of Discharge Encounter: Greater than 35 minutes   Signed, Graciella FreerMichael Andrew Tillery PA-C 07/27/2016, 4:10 PM

## 2016-07-27 NOTE — Progress Notes (Signed)
[facility] [id number] [facility] RN Hospital Liaison  Notified by Eunice Blaseebbie, [facility] of family request for [facility] after discharge.  Chart and patient information currently under review to confirm hospice eligibility.  Spoke with wife and son, Tawni CarnesHorace III at bedside to initiate education related to hospice philosophy, services and team approach to care.  Family verbalized understanding of the information provided.  Per discussion, plan is for discharge to home by [facility] with son, daughter and wife today.  Please send signed completed DNR form home with patient. Patient will need prescriptions for discharge comfort medication.  DME needs discussed.  This Clinical research associatewriter [facility] at [phone] and requested delivery of the following DME today:  Nebulizer, hospital bed, over bed table, bsc and emergency tanks.  Patient has a home fill oxygen unit; [facility] will deliver a concentrator to replace this.    Completed discharge summary will need to be [facility]ed [facility] at [phone] when final.  Please notify [facility] when patient is ready to leave unit at discharge--call [phone].  [facility] information and contact numbers have been given to patient's wife.    Please call with any questions.  Thank you, Kristine GarbeWendy Slingerland, RN, BSN [phone]

## 2016-07-27 NOTE — Consult Note (Signed)
   [facility]   07/27/2016  Mark [name] [birth year] [id number]      Patient screened for potential [facility] Care [facility] services. Chart reviewed. Noted current discharge plan is for home with hospice.  There are no identifiable [facility] needs at this time.  Raiford NobleAtika Hall, MSN-Ed, RN,BSN [facility] Care Management Hospital Liaison [phone]

## 2016-07-27 NOTE — Progress Notes (Signed)
Discussed with HF team regarding recs for home comfort meds.  With elevated Cr, would favor oxycodone rather than morphine.  Pain or Shortness of breath:  - Recommend start Oxycodone Intensol 20mg /ml; 5mg  (1/624ml) Q2hr sublingual prn pain or shortness of breath; Dispense 30ml on d/c.  Agitation: -Recommend start Ativan Intensol 2mg /ml; give 0.5mg  Q2H sublingual prn agitation; Dispense 30mL on d/c. -Recommend start Haldol Intensol 2mg /ml; give 0.5mg  Q2H sublingual prn agitation or nausea; Dispense 30mL on d/c.  Secretions:  -Recommend Atropine 1% eye drops (2 drops) sublingual Q4H sublingual prn secretions; Dispense 2ml on d/c.  Bowel regimen:   -Recommend Senna 1-2 tabs PO BID on d/c.  Mark MinusGene Jael Waldorf, MD [facility] Palliative Medicine Team [phone]

## 2016-07-27 NOTE — Progress Notes (Signed)
Spoke w pt, wife,son. He wants to go home by ambulance. Da at home to accept equipment. Has o2 w [facility] at home. He does not want to wait on eq-hosp bed he wants to go home and sit in his chair. Await dc orders and will call ambulance when dc put in. He wants to get home as soon as he can.

## 2016-07-27 NOTE — Telephone Encounter (Signed)
Pt being discharged home on Hospice.  Wanted to know if you will still be provider on record.  Told YES!  And approved initiating standard hospice orders.

## 2016-07-27 NOTE — Progress Notes (Signed)
Home by ambulance, hospice and pal care to follow

## 2016-07-27 NOTE — Progress Notes (Signed)
[facility] Rounding Note  Subjective/Interval History:   Back in AFib  After many discussions with treatment teams yesterday, plan is for discharge to home with hospice. On a positive note, creatinine dropped a little on its own, and he made about 1300 ml urine.  Therefore no need for further discussions about CRRT I am in full agreement with this plan.   Pt anxious to go home - says his kids will take care of him (also had option of [facility])  Objective Vital signs in last 24 hours: Vitals:   07/27/16 0340 07/27/16 0500 07/27/16 0600 07/27/16 0807  BP: (!) 148/103 (!) 159/100 (!) 153/104 (!) 156/100  Pulse: (!) 117 (!) 113 (!) 114 (!) 114  Resp: 19 (!) 21 18 16   Temp: 98.4 F (36.9 C)   97.7 F (36.5 C)  TempSrc: Oral   Oral  SpO2: 99% 100% 98% 100%  Weight:      Height:       Weight change:   Intake/Output Summary (Last 24 hours) at 07/27/16 0819 Last data filed at 07/27/16 0558  Gross per 24 hour  Intake              243 ml  Output             1190 ml  Net             -947 ml   Physical Exam:  Blood pressure (!) 156/100, pulse (!) 114, temperature 97.7 F (36.5 C), temperature source Oral, resp. rate 16, height 5\' 6"  (1.676 m), weight 74.9 kg (165 lb 2 oz), SpO2 100 %.  Frail older WM Coughing VS as noted Diffuse rhoncorous breath sounds, some exp wheezes Irreg Back in AF Rate controlled 80's Heart sounds otherwise obscured by resp noise Abd soft no focal tenderness Foley catheter Pacer wire R groin (to be pulled) 2-3+ LE edema/SCD's  Labs: Basic Metabolic Panel:  Recent Labs Lab 07/23/16 0928 07/25/16 0130 07/25/16 0144 07/25/16 0533 07/25/16 2221 07/26/16 0345 07/26/16 1600 07/27/16 0210  NA 138 135 136 137 138 137 140 142  K 4.7 5.9* 5.9* 5.4* 5.9* 5.9* 4.7 4.4  CL 105 106 106 106 101 98* 99* 104  CO2 21* 22  --  21* 26 25 27 27   GLUCOSE 142* 215* 202* 141* 121* 165* 149* 149*  BUN 88* 102* 109* 100* 112* 120* 134* 142*  CREATININE 3.78* 4.35*  4.10* 4.44* 5.07* 5.18* 5.01* 4.69*  CALCIUM 8.7* 10.3  --  8.7* 8.8* 8.7* 8.3* 8.1*  PHOS  --   --   --   --   --  6.6*  --  4.6     Recent Labs Lab 07/23/16 0928 07/25/16 0130 07/25/16 0144 07/25/16 0533 07/26/16 0345  WBC 7.7 7.8  --  12.0* 9.8  NEUTROABS  --  5.1  --   --   --   HGB 11.2* 9.8* 10.9* 10.7* 9.8*  HCT 34.0* 30.9* 32.0* 33.8* 29.9*  MCV 90.4 92.2  --  92.6 91.2  PLT 137* 105*  --  159 122*     Recent Labs Lab 07/26/16 0808 07/26/16 1236 07/26/16 1704 07/26/16 2107 07/27/16 0811  GLUCAP 144* 130* 150* 131* 152*    Studies/Results: No results found. Medications: . DOPamine 0 mcg/kg/min (07/25/16 0700)  . epinephrine Stopped (07/25/16 0445)   . budesonide (PULMICORT) nebulizer solution  0.5 mg Nebulization BID  . chlorhexidine  15 mL Mouth Rinse BID  . insulin aspart  0-15  Units Subcutaneous TID WC  . insulin aspart  0-5 Units Subcutaneous QHS  . ipratropium-albuterol  3 mL Nebulization Q6H  . mouth rinse  15 mL Mouth Rinse q12n4p  . sodium chloride flush  3 mL Intravenous Q12H    Assessment/Recommendations  1. Acute kidney injury (recurrent) on CKD4/5. With chronic cardiorenal syndrome. Now with yet another renal insult related to CHB/bradycardia/hypotension with associated hyperkalemia (getting kayexalate) and metabolic acidosis (better after bicarb). He is a very non-candidate for long term HD. Some spontaneous improvment in renal function. Making urine. Therefore no need for further discussions about CRRT.  Plan now is for home with Hospice. I agree with this plan of care 2. Hyperkalemia: Resolved and stable post kayexalate   2. Bradycardia/CHB/hypotension - resolved  3. PAH/end stage right heart failure - back in AF  4. Severe COPD  5. Disposition - After many discussions with treatment teams yesterday, plan is for discharge to home with hospice. On a positive note, creatinine dropped a little on its own, and he made about 1300 ml urine.   So no need for further discussions about CRRT and I am in full agreement with this plan. Therefore renal will sign off. Thanks for allowing us to participate in his care.   Camille Balynthia Mattea Seger, MD [facility] [phone] pager 07/27/2016, 8:19 AM

## 2016-07-27 NOTE — Progress Notes (Signed)
Received call from Mark [name] w paliative care late on 11-9 to set up w hospice and paliative care of [place]. She has spoke w family and they had requested hospice and paliative [place]. Called in referral to [phone] and they were to get liason to ck w family.

## 2016-07-27 NOTE — Progress Notes (Signed)
Advanced Heart Failure Team Progress Note   HPI:    Readmitted early 07/25/16 after a syncopal event found with profound bradycardia/CHB and hypotension requiring emergent transvenous pacing by Dr. Algie Coffer and dopamine gtt. ABG wth pH 7.1 K 5.9. Given calcium and glucagon as well.   Pt and family had extensive meeting with Palliative care on 07/26/16. They have requested no further escalation of care, and wish to return home to hospice. Once he gets home the plan is to transition to full comfort care.   Back in a fib with rates of 100-110.  Pacer wire remains in place. To be pulled this am.   Feeling OK this am.  Tired.  Still coughing.  Would like to go home. States all of his children will be there to help take care of him.   Urine output picking up some, but BUN continues to rise. .  Creatinine 3.8 > 4.4  > 5.18 > 4.69. BUN 100 > 120 > 140  Home Medications Prior to Admission medications   Medication Sig Start Date End Date Taking? Authorizing Provider  albuterol (PROVENTIL HFA;VENTOLIN HFA) 108 (90 Base) MCG/ACT inhaler Inhale 1-2 puffs into the lungs every 4 (four) hours as needed for wheezing or shortness of breath.   Yes Historical Provider, MD  atorvastatin (LIPITOR) 10 MG tablet Take 10 mg by mouth at bedtime.    Yes Historical Provider, MD  budesonide-formoterol (SYMBICORT) 80-4.5 MCG/ACT inhaler Inhale 2 puffs into the lungs 2 (two) times daily. 06/05/16  Yes Nyoka Cowden, MD  dextromethorphan-guaiFENesin [facility] DM) 30-600 MG 12hr tablet Take 1 tablet by mouth 2 (two) times daily as needed for cough.    Yes Historical Provider, MD  diltiazem (TIAZAC) 180 MG 24 hr capsule TAKE 2 CAPSULES BY MOUTH EVERY DAY 07/18/16  Yes Donita Brooks, MD  doxazosin (CARDURA) 4 MG tablet Take 1 tablet (4 mg total) by mouth daily. 05/30/16  Yes Donita Brooks, MD  flecainide (TAMBOCOR) 100 MG tablet Take 1 tablet (100 mg total) by mouth every 12 (twelve) hours. 07/23/16  Yes Tyrone Nine, MD    furosemide (LASIX) 40 MG tablet Take 1 tablet (40 mg total) by mouth 2 (two) times daily. 07/23/16  Yes Tyrone Nine, MD  metoprolol succinate (TOPROL-XL) 100 MG 24 hr tablet Take 1 tablet (100 mg total) by mouth daily. Take with or immediately following a meal. 04/06/16  Yes Donita Brooks, MD  ranitidine (ZANTAC) 150 MG capsule Take 150 mg by mouth daily as needed for heartburn.   Yes Historical Provider, MD  Rivaroxaban (XARELTO) 15 MG TABS tablet Take 1 tablet (15 mg total) by mouth daily with supper. 01/10/16  Yes Donita Brooks, MD  simethicone (MYLICON) 80 MG chewable tablet Chew 80 mg by mouth every 6 (six) hours as needed for flatulence.   Yes Historical Provider, MD  OXYGEN Use 2 L at bedtime and as needed for shortness of breath and activity    Historical Provider, MD  VENTOLIN HFA 108 (90 Base) MCG/ACT inhaler INHALE 2 PUFFS INTO THE LUNGS EVERY 6 (SIX) HOURS AS NEEDED FOR WHEEZING OR SHORTNESS OF BREATH. Patient not taking: Reported on 07/25/2016 11/29/15   Donita Brooks, MD    Past Medical History: Past Medical History:  Diagnosis Date  . AAA (abdominal aortic aneurysm) (HCC)    a. 09/2013: Resection and grafting of abdominal aortic aneurysm with insertion of an aorto by common iliac graft using 18 x 9  mm Hemashield Dacron graft.  . Arthritis    "little in my hands" (09/08/2013)  . Chronic diastolic CHF (congestive heart failure) (HCC)    a.  Echo (08/13/2013): EF 55-60%, normal wall motion, mild MR, mild LAE  . Chronic kidney disease (CKD), stage III (moderate)   . COPD (chronic obstructive pulmonary disease) (HCC)   . 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 GERD (gastroesophageal reflux disease)   . H/O hiatal hernia   . Hx of cardiovascular stress test    a. Myoview 09/2012:  no scar or ischemia  . Hyperlipidemia   . Hypertension   . On home oxygen therapy    "2L @ night" (07/17/2016)  . PAF (paroxysmal atrial fibrillation) (HCC)    a. amiodarone  Rx started 08/2013;  b. s/p TEE-DCCV 09/2013 => recurrent AFib => repeat DCCV => NSR;   c. Xarelto 15 QD due to CKD  . Pulmonary hypertension associated with unclear multi-factorial mechanisms    Only on echo (8/17).      Past Surgical History: Past Surgical History:  Procedure Laterality Date  . ABDOMINAL AORTIC ANEURYSM REPAIR  10/15/2012   Procedure: ANEURYSM ABDOMINAL AORTIC REPAIR;  Surgeon: Pryor OchoaJames D Lawson, MD;  [facility];  Service: Vascular;  Laterality: N/A;  Resection and Grafting of Abdominal Aortic Aneurysm - Aortobi-Iliac   . ABDOMINAL AORTIC ANEURYSM REPAIR  10-15-2012  . CARDIAC CATHETERIZATION N/A 07/25/2016   Procedure: Temporary Pacemaker;  Surgeon: Orpah CobbAjay Kadakia, MD;  Location: [facility] INVASIVE CV LAB;  Service: Cardiovascular;  Laterality: N/A;  . CARDIAC CATHETERIZATION N/A 07/25/2016   Procedure: Temporary Pacemaker;  Surgeon: Orpah CobbAjay Kadakia, MD;  Location: [facility] INVASIVE CV LAB;  Service: Cardiovascular;  Laterality: N/A;  . CARDIOVERSION N/A 09/22/2013   Procedure: CARDIOVERSION;  Surgeon: Lars MassonKatarina H Nelson, MD;  [facility];  Service: Cardiovascular;  Laterality: N/A;  . CARDIOVERSION  Jan. 12, 2015   Dr. Doylene CanningGregg W. Ladona Ridgelaylor  . CARDIOVERSION N/A 09/28/2013   Procedure: CARDIOVERSION;  Surgeon: Marinus MawGregg W Taylor, MD;  [facility];  Service: Cardiovascular;  Laterality: N/A;  . CARDIOVERSION N/A 06/04/2016   Procedure: CARDIOVERSION;  Surgeon: Lars MassonKatarina H Nelson, MD;  [facility];  Service: Cardiovascular;  Laterality: N/A;  . CARDIOVERSION N/A 07/21/2016   Procedure: CARDIOVERSION;  Surgeon: Dolores Pattyaniel R Bensimhon, MD;  [facility];  Service: Cardiovascular;  Laterality: N/A;  . INGUINAL HERNIA REPAIR Right 12/22/2014   Procedure: OPEN RIGHT INGUINAL HERNIA REPAIR WITH MESH;  Surgeon: Axel FillerArmando Ramirez, MD;  Location: [facility] ORS;  Service: General;  Laterality: Right;  . INSERTION OF MESH N/A 12/22/2014   Procedure: INSERTION OF MESH;  Surgeon: Axel FillerArmando Ramirez, MD;   Location: [facility] ORS;  Service: General;  Laterality: N/A;  . KNEE ARTHROSCOPY Right   . TEE WITHOUT CARDIOVERSION N/A 09/22/2013   Procedure: TRANSESOPHAGEAL ECHOCARDIOGRAM (TEE);  Surgeon: Lars MassonKatarina H Nelson, MD;  [facility];  Service: Cardiovascular;  Laterality: N/A;    Family History: Family History  Problem Relation Age of Onset  . Diabetes Mother   . Tuberculosis Mother   . Heart disease Father   . Heart attack Father   . Heart disease Son     Blood clot:  Arm    Social History: Social History   Social History  . Marital status: Married    Spouse name: [name]  . Number of children: 4  . Years of education: N/A   Occupational History  . Works PT as a  painter    Social History Main Topics  . Smoking status: Former Smoker    Packs/day: 1.00    Years: 56.00    Types: Cigarettes    Quit date: 07/31/2013  . Smokeless tobacco: Former Neurosurgeon    Types: Chew  . Alcohol use No  . Drug use: No  . Sexual activity: Not Currently   Other Topics Concern  . None   Social History Narrative   Married.  Lives with wife.  Ambulates without assistance.    Allergies:  Allergies  Allergen Reactions  . Niaspan [Niacin Er] Anaphylaxis  . Amiodarone     Stopped due to lung issues    Objective:    Vital Signs:   Temp:  [97.7 F (36.5 C)-98.4 F (36.9 C)] 98.4 F (36.9 C) (11/10 0340) Pulse Rate:  [63-117] 114 (11/10 0600) Resp:  [12-24] 18 (11/10 0600) BP: (118-176)/(70-111) 153/104 (11/10 0600) SpO2:  [98 %-100 %] 98 % (11/10 0600) Last BM Date: 07/26/16  Weight change: Filed Weights   07/25/16 0449 07/25/16 1400  Weight: 159 lb 6.3 oz (72.3 kg) 165 lb 2 oz (74.9 kg)    Intake/Output:   Intake/Output Summary (Last 24 hours) at 07/27/16 0719 Last data filed at 07/27/16 0558  Gross per 24 hour  Intake              363 ml  Output             1290 ml  Net             -927 ml     Physical Exam: General:  Chronically ill, elderly, and fatigued  appearing.  HEENT: Normal, x/ for poor dentition.  Neck: supple. JVP elevated to ear. Carotids 2+ bilat; no bruits. No thyromegaly or nodule noted. Cor: PMI nondisplaced. Irregularly irregular. 2/6 TR. Lungs: Diminished throughout. Scant bibasilar crackles. No wheeze. Abdomen: soft, NT, mild/mod distended, no HSM. No bruits or masses. +BS  Extremities: no cyanosis, clubbing, rash, 2+ edema into thighs. Temporary wire R groin with leg immobilizer Neuro: alert & orientedx3, cranial nerves grossly intact. moves all 4 extremities w/o difficulty. Affect pleasant, flat  Telemetry: Reviewed, Pt went back into Afib ~1100 07/26/16. Rates in 100-120s  Labs: Basic Metabolic Panel:  Recent Labs Lab 07/25/16 0533 07/25/16 2221 07/26/16 0345 07/26/16 1600 07/27/16 0210  NA 137 138 137 140 142  K 5.4* 5.9* 5.9* 4.7 4.4  CL 106 101 98* 99* 104  CO2 21* 26 25 27 27   GLUCOSE 141* 121* 165* 149* 149*  BUN 100* 112* 120* 134* 142*  CREATININE 4.44* 5.07* 5.18* 5.01* 4.69*  CALCIUM 8.7* 8.8* 8.7* 8.3* 8.1*  MG  --   --  2.8*  --   --   PHOS  --   --  6.6*  --  4.6    Liver Function Tests:  Recent Labs Lab 07/27/16 0210  ALBUMIN 3.2*   No results for input(s): LIPASE, AMYLASE in the last 168 hours. No results for input(s): AMMONIA in the last 168 hours.  CBC:  Recent Labs Lab 07/22/16 0309 07/23/16 0928 07/25/16 0130 07/25/16 0144 07/25/16 0533 07/26/16 0345  WBC 5.2 7.7 7.8  --  12.0* 9.8  NEUTROABS  --   --  5.1  --   --   --   HGB 10.5* 11.2* 9.8* 10.9* 10.7* 9.8*  HCT 32.5* 34.0* 30.9* 32.0* 33.8* 29.9*  MCV 89.8 90.4 92.2  --  92.6 91.2  PLT 135* 137* 105*  --  159 122*    Cardiac Enzymes: No results for input(s): CKTOTAL, CKMB, CKMBINDEX, TROPONINI in the last 168 hours.  BNP: BNP (last 3 results)  Recent Labs  07/17/16 1302  BNP 915.0*    ProBNP (last 3 results)  Recent Labs  05/08/16 1212  PROBNP 1,079.0*     CBG:  Recent Labs Lab 07/26/16 0405  07/26/16 0808 07/26/16 1236 07/26/16 1704 07/26/16 2107  GLUCAP 165* 144* 130* 150* 131*    Coagulation Studies: No results for input(s): LABPROT, INR in the last 72 hours.  Other results: EKG: CHB with junctional 20-30s  Imaging: No results found.   Medications:     Current Medications: . budesonide (PULMICORT) nebulizer solution  0.5 mg Nebulization BID  . chlorhexidine  15 mL Mouth Rinse BID  . insulin aspart  0-15 Units Subcutaneous TID WC  . insulin aspart  0-5 Units Subcutaneous QHS  . ipratropium-albuterol  3 mL Nebulization Q6H  . mouth rinse  15 mL Mouth Rinse q12n4p  . sodium chloride flush  3 mL Intravenous Q12H    Infusions: . DOPamine 0 mcg/kg/min (07/25/16 0700)  . epinephrine Stopped (07/25/16 0445)     Assessment:   1. Syncope with CHB 2. Hypotension/shock due to #1 3. PAFL s/p recent DC-CV 4. PAH with end-stage RHF 5. 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 COPD 7. Hyperkalemia  Plan/Discussion:    Pt is critically ill with worsening multi-system organ failure in the setting of end-stage Right HF complicated by an episode of complete heart block, hypotension, and ARF on CKD IV.   Pt went back into afib yesterday. Rates in 100-110s. Flecainide remains on hold. Will pull pacing wire this am.       Xarelto on hold with Renal function. Heparin held with potential for CVVHD cath.  No anticoagulation at home given hospice.    Cardiac issues remains unstable. He is back in afib and looks worse than yesterday, slightly lethargic on my initial exam and stirred to A/O x 3 with gentle questioning.  K down to 4.4 with second dose of kayexalate.   Creatinine slightly improved, but BUN continues to worsen significant for excretion but lack of filtration.  Suspect he will become uremic in the next several days.   Pt has prognosis of < 2 weeks and [facility], however pt and family have decided they would rather have him at  home. Appreciate palliative and renal input.     Will discuss optimal meds with MD and plan on sending home with hospice.   Length of Stay: 2  Luane SchoolMichael Andrew Tillery, PA-C 07/27/2016, 7:19 AM  Advanced Heart Failure Team Pager [phone] (M-F; 7a - 4p)  Please contact [facility] for night-coverage after hours (4p -7a ) and weekends on [email or web address]  Patient seen with PA and plan discussed.  He will go home with hospice.  BUN very high, suspect that his prognosis is days to a couple of weeks.   Home with Lasix 80 mg po bid to try to keep fluid controlled.   Marca AnconaDalton Kashmir Leedy 07/27/2016 10:53 AM

## 2016-07-27 NOTE — Progress Notes (Signed)
  Per Darel HongAbdy Tillery, RN pulled transvenous pacer at 0900. No complications, site level 0. Pressure dressing applied.

## 2016-07-30 ENCOUNTER — Other Ambulatory Visit: Payer: Self-pay

## 2016-07-30 NOTE — Patient Outreach (Signed)
[facility] Care Management  07/30/2016  Mark [name] 19-Sep-1945 [id number]     EMMI-COPD RED ON EMMI ALERT Day # 5 Date: 07/28/16 Red Alert Reason: "# of times rescue inhaler used in past 24 hours? 10"    Outreach attempt #1 to patient/spouse. Spoke with spouse. Confirmed that patient was discharged home with home hospice. Patient given poor prognosis during hospitalization of days to a few weeks. Spouse states she is coping fairly well. She has supportive family assisting her. She reports that patient started having more SOB a few days ago. She did notify hospice and they have since put in place additional comfort meds to help manage symptoms. She reports that meds have helped in keeping patient more comfortable. She denies any needs/questions or concerns at this time. Reviewed with spouse hospice 24/7 availability and to contact them for any changes in patient's condition or further concerns. RN CM informed spouse that since patient on hospice and receiving EOL care that automated EMMI calls would be discontinued. Spouse verbalized understanding.    Plan: RN CM will notify [facility] administrative assistant of case closure status and to deactivate EMMI calls.   Antionette Fairyoshanda Renley Gutman, RN,BSN,CCM [facility] Care Management Telephonic Care Management Coordinator Direct Phone: [phone] Toll Free: [phone] Fax: [phone]

## 2016-08-02 ENCOUNTER — Ambulatory Visit (HOSPITAL_COMMUNITY): Admit: 2016-08-02 | Payer: PPO | Admitting: Gastroenterology

## 2016-08-02 ENCOUNTER — Encounter (HOSPITAL_COMMUNITY): Payer: Self-pay

## 2016-08-02 SURGERY — COLONOSCOPY WITH PROPOFOL
Anesthesia: Monitor Anesthesia Care

## 2016-08-06 ENCOUNTER — Ambulatory Visit: Payer: PPO | Admitting: Nurse Practitioner

## 2016-08-10 ENCOUNTER — Encounter: Payer: Self-pay | Admitting: Family Medicine

## 2016-08-13 ENCOUNTER — Telehealth: Payer: Self-pay | Admitting: Family Medicine

## 2016-08-13 NOTE — Telephone Encounter (Signed)
Nurse from Hospice calling because she rec'd lab work done Friday.  She was at patient's house and they wanted to know results.  BMET showed BUN to be 44 and Creatinine was 2.11.  Looking at last BMET those levels are much better.  Told nurse faxed results are in provder basket to review.  He has been very busy today.  Nurse will call patient after he sees and let patient know of of any treatment changes.

## 2016-08-14 NOTE — Telephone Encounter (Signed)
Selena BattenKim, I can't find those labs, do you have a copy?

## 2016-08-15 NOTE — Telephone Encounter (Signed)
Called hospice to have them send a copy to us.

## 2016-08-15 NOTE — Telephone Encounter (Signed)
Labs have been reviewed by PCP.  Per his recommendation, no changes in current treatment.  Lab values look good and are holding steady.

## 2016-08-16 NOTE — Telephone Encounter (Signed)
Robin of hospice aware of lab results and states that she will see pt today and inform him of results.

## 2016-08-17 ENCOUNTER — Telehealth: Payer: Self-pay | Admitting: Family Medicine

## 2016-08-17 NOTE — Telephone Encounter (Signed)
Morrie Sheldonshley patients granddaughter calling to speak to you regarding her grandfather, says they do not know what steps to take from here since hospice is involved, and he is in a lot of pain  [phone]

## 2016-08-17 NOTE — Telephone Encounter (Signed)
Called and spoke to Mark [name] and she had multiple questions about pt's health - suggested appt and she agreed and appt made

## 2016-08-20 ENCOUNTER — Telehealth: Payer: Self-pay

## 2016-08-20 ENCOUNTER — Ambulatory Visit: Payer: PPO | Admitting: Family Medicine

## 2016-08-20 ENCOUNTER — Encounter: Payer: Self-pay | Admitting: Family Medicine

## 2016-08-20 ENCOUNTER — Ambulatory Visit (INDEPENDENT_AMBULATORY_CARE_PROVIDER_SITE_OTHER): Payer: PPO | Admitting: Family Medicine

## 2016-08-20 VITALS — BP 200/100 | HR 74 | Temp 97.9°F | Resp 20 | Ht 66.5 in | Wt 140.0 lb

## 2016-08-20 DIAGNOSIS — I5032 Chronic diastolic (congestive) heart failure: Secondary | ICD-10-CM

## 2016-08-20 DIAGNOSIS — N184 Chronic kidney disease, stage 4 (severe): Secondary | ICD-10-CM | POA: Diagnosis not present

## 2016-08-20 DIAGNOSIS — I10 Essential (primary) hypertension: Secondary | ICD-10-CM

## 2016-08-20 DIAGNOSIS — D649 Anemia, unspecified: Secondary | ICD-10-CM

## 2016-08-20 DIAGNOSIS — R195 Other fecal abnormalities: Secondary | ICD-10-CM

## 2016-08-20 MED ORDER — AMLODIPINE BESYLATE 5 MG PO TABS: 5.0000 mg | ORAL_TABLET | Freq: Every day | ORAL | 3 refills | Status: AC

## 2016-08-20 NOTE — Progress Notes (Signed)
Subjective:    Patient ID: Mark [name], male    DOB: 10/23/1945, 70 y.o.   MRN: [id number]  HPI  Patient presents today extremely confused. His first question to me is why am I still hear. During his last hospital admission, he was discharged home on hospice and told that he had less than a week to live. He is very confused because he is here today and seems to be doing better. Patient had been recently admitted to the hospital several times with acute diastolic heart failure requiring Lasix diuresis. This exacerbated his stage IV chronic kidney disease and he developed acute renal failure. He is not a dialysis candidate. To improve cardiac output, he underwent cardioversion. Unfortunately he developed a complete heart block secondary to medication after the cardioversion once he was discharged home from the hospital. He experienced syncope and fall and cardiogenic shock due to this and was readmitted. He also had diastolic heart failure during that admission as well. He was discharged home on 80 mg of Lasix twice daily. The first day home from the hospital he diuresed approximately 10 pounds. Therefore he independently decrease his Lasix to 40 mg a day. Since that time he states that he is doing extremely well. He feels much better. Albeit he is short of breath with minimal activity and has stage III diastolic heart failure he has rallied and subjectively feels much better. However today his blood pressure is extremely high and I think increases the likelihood of pulmonary edema and heart failure readmission. On examination today he does have bibasilar crackles consistent with fluid and pulmonary edema area there is no pitting edema in the extremities. He does report shortness of breath with activity. He is on 24 hour a day oxygen and his oxygen saturations are maintained greater than 90% on 2-3 L depending upon activity level. Past Medical History:  Diagnosis Date  . AAA (abdominal aortic aneurysm)  (HCC)    a. 09/2013: Resection and grafting of abdominal aortic aneurysm with insertion of an aorto by common iliac graft using 18 x 9 mm Hemashield Dacron graft.  . Arthritis    "little in my hands" (09/08/2013)  . Chronic diastolic CHF (congestive heart failure) (HCC)    a.  Echo (08/13/2013): EF 55-60%, normal wall motion, mild MR, mild LAE  . Chronic kidney disease (CKD), stage III (moderate)   . COPD (chronic obstructive pulmonary disease) (HCC)   . 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 GERD (gastroesophageal reflux disease)   . H/O hiatal hernia   . Hx of cardiovascular stress test    a. Myoview 09/2012:  no scar or ischemia  . Hyperlipidemia   . Hypertension   . On home oxygen therapy    "2L @ night" (07/17/2016)  . PAF (paroxysmal atrial fibrillation) (HCC)    a. amiodarone Rx started 08/2013;  b. s/p TEE-DCCV 09/2013 => recurrent AFib => repeat DCCV => NSR;   c. Xarelto 15 QD due to CKD  . Pulmonary hypertension associated with unclear multi-factorial mechanisms    Only on echo (8/17).     Past Surgical History:  Procedure Laterality Date  . ABDOMINAL AORTIC ANEURYSM REPAIR  10/15/2012   Procedure: ANEURYSM ABDOMINAL AORTIC REPAIR;  Surgeon: Pryor OchoaJames D Lawson, MD;  [facility];  Service: Vascular;  Laterality: N/A;  Resection and Grafting of Abdominal Aortic Aneurysm - Aortobi-Iliac   . ABDOMINAL AORTIC ANEURYSM REPAIR  10-15-2012  . CARDIAC CATHETERIZATION N/A 07/25/2016  Procedure: Temporary Pacemaker;  Surgeon: Orpah Cobb, MD;  Location: [facility] INVASIVE CV LAB;  Service: Cardiovascular;  Laterality: N/A;  . CARDIAC CATHETERIZATION N/A 07/25/2016   Procedure: Temporary Pacemaker;  Surgeon: Orpah Cobb, MD;  Location: [facility] INVASIVE CV LAB;  Service: Cardiovascular;  Laterality: N/A;  . CARDIOVERSION N/A 09/22/2013   Procedure: CARDIOVERSION;  Surgeon: Lars Masson, MD;  Location: [facility] ENDOSCOPY;  Service: Cardiovascular;  Laterality: N/A;  . CARDIOVERSION   Jan. 12, 2015   Dr. Doylene Canning. Ladona Ridgel  . CARDIOVERSION N/A 09/28/2013   Procedure: CARDIOVERSION;  Surgeon: Marinus Maw, MD;  Location: [facility] CATH LAB;  Service: Cardiovascular;  Laterality: N/A;  . CARDIOVERSION N/A 06/04/2016   Procedure: CARDIOVERSION;  Surgeon: Lars Masson, MD;  Location: [facility] ENDOSCOPY;  Service: Cardiovascular;  Laterality: N/A;  . CARDIOVERSION N/A 07/21/2016   Procedure: CARDIOVERSION;  Surgeon: Dolores Patty, MD;  Location: [facility] OR;  Service: Cardiovascular;  Laterality: N/A;  . INGUINAL HERNIA REPAIR Right 12/22/2014   Procedure: OPEN RIGHT INGUINAL HERNIA REPAIR WITH MESH;  Surgeon: Axel Filler, MD;  Location: [facility] ORS;  Service: General;  Laterality: Right;  . INSERTION OF MESH N/A 12/22/2014   Procedure: INSERTION OF MESH;  Surgeon: Axel Filler, MD;  Location: [facility] ORS;  Service: General;  Laterality: N/A;  . KNEE ARTHROSCOPY Right   . TEE WITHOUT CARDIOVERSION N/A 09/22/2013   Procedure: TRANSESOPHAGEAL ECHOCARDIOGRAM (TEE);  Surgeon: Lars Masson, MD;  Location: [facility] ENDOSCOPY;  Service: Cardiovascular;  Laterality: N/A;   Current Outpatient Prescriptions on File Prior to Visit  Medication Sig Dispense Refill  . albuterol (PROVENTIL HFA;VENTOLIN HFA) 108 (90 Base) MCG/ACT inhaler Inhale 1-2 puffs into the lungs every 4 (four) hours as needed for wheezing or shortness of breath.    Marland Kitchen atropine 1 % ophthalmic solution Place 2 drops under the tongue every 4 (four) hours as needed (for secretions). (Patient not taking: Reported on 08/20/2016) 2 mL 0  . budesonide-formoterol (SYMBICORT) 80-4.5 MCG/ACT inhaler Inhale 2 puffs into the lungs 2 (two) times daily. (Patient not taking: Reported on 08/20/2016) 1 Inhaler 0  . dextromethorphan-guaiFENesin (MUCINEX DM) 30-600 MG 12hr tablet Take 1 tablet by mouth 2 (two) times daily as needed for cough.     . diltiazem (CARDIZEM CD) 120 MG 24 hr capsule Take 1 capsule (120 mg total) by mouth daily. 30 capsule 3  . furosemide  (LASIX) 40 MG tablet Take 2 tablets (80 mg total) by mouth 2 (two) times daily. 60 tablet 6  . haloperidol (HALDOL) 2 MG/ML solution Take 0.3 mLs (0.6 mg total) by mouth every 2 (two) hours as needed for agitation. (Patient not taking: Reported on 08/20/2016) 30 mL 0  . LORazepam (ATIVAN) 2 MG/ML concentrated solution Place 0.3 mLs (0.6 mg total) under the tongue every 2 (two) hours as needed (Agitation). 30 mL 0  . oxyCODONE (ROXICODONE INTENSOL) 20 MG/ML concentrated solution Take 0.3 mLs (6 mg total) by mouth every 4 (four) hours as needed for severe pain (or shortness of breath). 30 mL 0  . OXYGEN Use 2 L at bedtime and as needed for shortness of breath and activity    . senna (SENOKOT) 8.6 MG TABS tablet Take 1-2 tablets (8.6-17.2 mg total) by mouth 2 (two) times daily. (Patient not taking: Reported on 08/20/2016) 120 each 0  . simethicone (MYLICON) 80 MG chewable tablet Chew 80 mg by mouth every 6 (six) hours as needed for flatulence.     No current facility-administered medications on  file prior to visit.    Allergies  Allergen Reactions  . Niaspan [Niacin Er] Anaphylaxis  . Amiodarone     Stopped due to lung issues   Social History   Social History  . Marital status: Married    Spouse name: [name]  . Number of children: 4  . Years of education: N/A   Occupational History  . Works PT as a Education administratorpainter    Social History Main Topics  . Smoking status: Former Smoker    Packs/day: 1.00    Years: 56.00    Types: Cigarettes    Quit date: 07/31/2013  . Smokeless tobacco: Former NeurosurgeonUser    Types: Chew  . Alcohol use No  . Drug use: No  . Sexual activity: Not Currently   Other Topics Concern  . Not on file   Social History Narrative   Married.  Lives with wife.  Ambulates without assistance.      Review of Systems  All other systems reviewed and are negative.      Objective:   Physical Exam  Constitutional: He appears well-developed and well-nourished. No distress.  Neck:  No JVD present.  Cardiovascular: Normal rate and regular rhythm.   Pulmonary/Chest: He has no wheezes. He has rales.  Abdominal: Soft. Bowel sounds are normal. He exhibits no distension. There is no tenderness. There is no rebound.  Musculoskeletal: He exhibits no edema.  Skin: He is not diaphoretic.          Assessment & Plan:  CKD (chronic kidney disease) stage 4, GFR 15-29 ml/min (HCC)  Benign essential HTN  Chronic diastolic heart failure (HCC) I spent more than 40 minutes with this patient. I explained to him that it is difficult to predict life expectancy. I explained to the patient that he can easily develop an acute exacerbation of diastolic heart failure tomorrow and require readmission to the hospital. He may not diurese due to his chronic kidney disease. Therefore, the patient does qualify for hospice. I anticipate that his life expectancy is less than 6 months. However he does appear much better than his last hospital discharge summary would indicate. We need to focus on his elevated blood pressure as this increases likelihood he will be readmitted to the hospital. Given his COPD and history of complete heart block I will avoid any agent that may slow his heart rate as he is already on diltiazem and given his tenuous renal function I will avoid any agent that is renally active. Therefore by process of elimination his best option would be amlodipine. I will the patient begin amlodipine 5 mg a day. We will need to monitor closely for fluid retention. The patient is only taking Lasix 40 mg a day. Given the findings on pulmonary exam I'll increase this to 40 mg twice a day and recheck his blood pressure in one week. I explained to the patient his situation. He seems more comfortable now his diagnosis. He knows that there is no cure and that his primary problem is heart. Heart failure will likely be the cause of death. However the present time his renal function has improved to the point  that Lasix is still working and I believe this is what is allowed the patient to do reasonably well since his discharge home from the hospital.

## 2016-08-20 NOTE — Progress Notes (Signed)
Patient was listed in the schedule twice. Please see earlier dictation for this date

## 2016-08-20 NOTE — Telephone Encounter (Signed)
-----   Message from Donata DuffPatty L Lewis, RN sent at 07/19/2016 10:54 AM EDT ----- Pt needs endo colon [facility] on xarelto, see 07/19/16 note

## 2016-08-24 NOTE — Telephone Encounter (Signed)
Left message on machine to call back  

## 2016-08-27 ENCOUNTER — Other Ambulatory Visit: Payer: Self-pay

## 2016-08-27 DIAGNOSIS — D649 Anemia, unspecified: Secondary | ICD-10-CM

## 2016-08-27 DIAGNOSIS — R195 Other fecal abnormalities: Secondary | ICD-10-CM

## 2016-08-27 MED ORDER — NA SULFATE-K SULFATE-MG SULF 17.5-3.13-1.6 GM/177ML PO SOLN: 1.0000 | Freq: Once | ORAL | 0 refills | Status: AC

## 2016-08-27 NOTE — Telephone Encounter (Signed)
FYI:  Pt's wife states that the pt will not be having any procedures that he is not well enough to come to the doctor or have any test.

## 2016-08-28 ENCOUNTER — Telehealth: Payer: Self-pay | Admitting: Family Medicine

## 2016-09-07 ENCOUNTER — Telehealth: Payer: Self-pay | Admitting: Internal Medicine

## 2016-09-07 NOTE — Telephone Encounter (Signed)
Send condolence card

## 2016-09-07 NOTE — Telephone Encounter (Signed)
Will route message to MW to make him aware. 

## 2016-09-17 NOTE — Telephone Encounter (Signed)
Rita from [employer] [facility] [employer] in [place] called and states that patient expired this AM. He is going to drop off the death certificate for Dr. Tanya NonesPickard to sign.  CB# [phone]

## 2016-09-17 NOTE — Telephone Encounter (Signed)
Death certificate has been dropped off and placed on Dr. Caren MacadamPickard's desk after he has signed please fax to [phone] since this is a cremation afterwards please mail in the envelope that was left with death certificate.  CB# [phone]

## 2016-09-17 DEATH — deceased

## 2016-09-27 ENCOUNTER — Encounter (HOSPITAL_COMMUNITY): Payer: Self-pay

## 2016-09-27 ENCOUNTER — Ambulatory Visit (HOSPITAL_COMMUNITY): Admit: 2016-09-27 | Payer: PPO | Admitting: Gastroenterology

## 2016-09-27 SURGERY — ESOPHAGOGASTRODUODENOSCOPY (EGD) WITH PROPOFOL
Anesthesia: Monitor Anesthesia Care

## 2016-10-03 ENCOUNTER — Ambulatory Visit: Payer: PPO | Admitting: Cardiovascular Disease

## 2016-10-08 ENCOUNTER — Ambulatory Visit: Payer: PPO | Admitting: Internal Medicine

## 2018-01-07 ENCOUNTER — Ambulatory Visit: Payer: PPO | Admitting: Family

## 2018-01-07 ENCOUNTER — Encounter (HOSPITAL_COMMUNITY): Payer: PPO
